# Patient Record
Sex: Female | Born: 1950
Health system: Southern US, Community
[De-identification: ages and names within clinical notes are randomized; demographics above are authoritative.]

## PROBLEM LIST (undated history)

## (undated) DIAGNOSIS — K219 Gastro-esophageal reflux disease without esophagitis: Secondary | ICD-10-CM

## (undated) DIAGNOSIS — K222 Esophageal obstruction: Secondary | ICD-10-CM

## (undated) DIAGNOSIS — IMO0001 Reserved for inherently not codable concepts without codable children: Secondary | ICD-10-CM

## (undated) DIAGNOSIS — F329 Major depressive disorder, single episode, unspecified: Secondary | ICD-10-CM

## (undated) DIAGNOSIS — Z9981 Dependence on supplemental oxygen: Secondary | ICD-10-CM

## (undated) DIAGNOSIS — Z5189 Encounter for other specified aftercare: Secondary | ICD-10-CM

## (undated) DIAGNOSIS — F419 Anxiety disorder, unspecified: Secondary | ICD-10-CM

## (undated) DIAGNOSIS — Z720 Tobacco use: Secondary | ICD-10-CM

## (undated) DIAGNOSIS — J449 Chronic obstructive pulmonary disease, unspecified: Secondary | ICD-10-CM

## (undated) DIAGNOSIS — E559 Vitamin D deficiency, unspecified: Secondary | ICD-10-CM

## (undated) DIAGNOSIS — E538 Deficiency of other specified B group vitamins: Secondary | ICD-10-CM

## (undated) DIAGNOSIS — K760 Fatty (change of) liver, not elsewhere classified: Secondary | ICD-10-CM

## (undated) DIAGNOSIS — F99 Mental disorder, not otherwise specified: Secondary | ICD-10-CM

## (undated) DIAGNOSIS — J439 Emphysema, unspecified: Secondary | ICD-10-CM

## (undated) DIAGNOSIS — Z8701 Personal history of pneumonia (recurrent): Secondary | ICD-10-CM

## (undated) HISTORY — DX: Fatty (change of) liver, not elsewhere classified: K76.0

## (undated) HISTORY — DX: Emphysema, unspecified: J43.9

## (undated) HISTORY — DX: Esophageal obstruction: K22.2

## (undated) HISTORY — PX: CHOLECYSTECTOMY: SHX55

## (undated) HISTORY — DX: Reserved for inherently not codable concepts without codable children: IMO0001

## (undated) HISTORY — PX: COLONOSCOPY: SHX174

## (undated) HISTORY — DX: Chronic obstructive pulmonary disease, unspecified: J44.9

## (undated) HISTORY — DX: Mental disorder, not otherwise specified: F99

## (undated) HISTORY — DX: Encounter for other specified aftercare: Z51.89

## (undated) HISTORY — DX: Major depressive disorder, single episode, unspecified: F32.9

## (undated) HISTORY — PX: ESOPHAGOGASTRODUODENOSCOPY (EGD) WITH ESOPHAGEAL DILATION: SHX5812

## (undated) HISTORY — PX: BREAST SURGERY: SHX581

## (undated) HISTORY — PX: ABDOMINAL HYSTERECTOMY: SHX81

## (undated) HISTORY — DX: Vitamin D deficiency, unspecified: E55.9

## (undated) HISTORY — DX: Deficiency of other specified B group vitamins: E53.8

## (undated) HISTORY — DX: Tobacco use: Z72.0

## (undated) HISTORY — DX: Anxiety disorder, unspecified: F41.9

## (undated) HISTORY — PX: UPPER GASTROINTESTINAL ENDOSCOPY: SHX188

## (undated) HISTORY — PX: CERVICAL CONE BIOPSY: SUR198

---
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
## 1998-11-06 ENCOUNTER — Emergency Department (HOSPITAL_COMMUNITY): Admission: EM | Admit: 1998-11-06 | Discharge: 1998-11-06 | Payer: Self-pay | Admitting: Emergency Medicine

## 2002-05-03 ENCOUNTER — Inpatient Hospital Stay (HOSPITAL_COMMUNITY): Admission: EM | Admit: 2002-05-03 | Discharge: 2002-05-11 | Payer: Self-pay | Admitting: Emergency Medicine

## 2002-05-03 ENCOUNTER — Encounter: Payer: Self-pay | Admitting: Emergency Medicine

## 2002-05-04 ENCOUNTER — Encounter: Payer: Self-pay | Admitting: Internal Medicine

## 2002-05-07 ENCOUNTER — Encounter: Payer: Self-pay | Admitting: Internal Medicine

## 2002-05-10 ENCOUNTER — Encounter: Payer: Self-pay | Admitting: Internal Medicine

## 2003-09-13 ENCOUNTER — Encounter: Admission: RE | Admit: 2003-09-13 | Discharge: 2003-09-13 | Payer: Self-pay | Admitting: Family Medicine

## 2003-09-13 ENCOUNTER — Encounter (INDEPENDENT_AMBULATORY_CARE_PROVIDER_SITE_OTHER): Payer: Self-pay | Admitting: Specialist

## 2003-09-29 ENCOUNTER — Ambulatory Visit (HOSPITAL_COMMUNITY): Admission: RE | Admit: 2003-09-29 | Discharge: 2003-09-29 | Payer: Self-pay | Admitting: Family Medicine

## 2003-10-02 ENCOUNTER — Other Ambulatory Visit: Admission: RE | Admit: 2003-10-02 | Discharge: 2003-10-02 | Payer: Self-pay | Admitting: Obstetrics & Gynecology

## 2003-10-25 ENCOUNTER — Ambulatory Visit (HOSPITAL_COMMUNITY): Admission: RE | Admit: 2003-10-25 | Discharge: 2003-10-25 | Payer: Self-pay | Admitting: Family Medicine

## 2003-10-26 ENCOUNTER — Ambulatory Visit (HOSPITAL_COMMUNITY): Admission: RE | Admit: 2003-10-26 | Discharge: 2003-10-26 | Payer: Self-pay | Admitting: Obstetrics & Gynecology

## 2003-11-06 ENCOUNTER — Inpatient Hospital Stay (HOSPITAL_COMMUNITY): Admission: AD | Admit: 2003-11-06 | Discharge: 2003-11-12 | Payer: Self-pay | Admitting: General Surgery

## 2003-11-24 ENCOUNTER — Encounter: Admission: RE | Admit: 2003-11-24 | Discharge: 2003-11-24 | Payer: Self-pay | Admitting: Psychiatry

## 2005-04-26 ENCOUNTER — Emergency Department (HOSPITAL_COMMUNITY): Admission: EM | Admit: 2005-04-26 | Discharge: 2005-04-26 | Payer: Self-pay | Admitting: Emergency Medicine

## 2005-09-29 ENCOUNTER — Ambulatory Visit (HOSPITAL_COMMUNITY): Admission: RE | Admit: 2005-09-29 | Discharge: 2005-09-29 | Payer: Self-pay | Admitting: General Surgery

## 2005-11-07 ENCOUNTER — Observation Stay (HOSPITAL_COMMUNITY): Admission: EM | Admit: 2005-11-07 | Discharge: 2005-11-07 | Payer: Self-pay | Admitting: Emergency Medicine

## 2006-05-12 ENCOUNTER — Ambulatory Visit: Payer: Self-pay | Admitting: Internal Medicine

## 2006-06-02 ENCOUNTER — Ambulatory Visit: Payer: Self-pay | Admitting: Internal Medicine

## 2006-06-29 ENCOUNTER — Ambulatory Visit: Payer: Self-pay | Admitting: Internal Medicine

## 2006-08-06 ENCOUNTER — Ambulatory Visit: Payer: Self-pay | Admitting: Internal Medicine

## 2006-11-11 ENCOUNTER — Ambulatory Visit: Payer: Self-pay | Admitting: Internal Medicine

## 2006-12-24 ENCOUNTER — Ambulatory Visit: Payer: Self-pay | Admitting: Internal Medicine

## 2007-02-23 ENCOUNTER — Ambulatory Visit: Payer: Self-pay | Admitting: Internal Medicine

## 2007-03-24 DIAGNOSIS — R498 Other voice and resonance disorders: Secondary | ICD-10-CM

## 2007-03-24 DIAGNOSIS — J449 Chronic obstructive pulmonary disease, unspecified: Secondary | ICD-10-CM

## 2007-04-05 ENCOUNTER — Ambulatory Visit: Payer: Self-pay | Admitting: Internal Medicine

## 2007-04-05 DIAGNOSIS — M810 Age-related osteoporosis without current pathological fracture: Secondary | ICD-10-CM

## 2007-04-05 DIAGNOSIS — J441 Chronic obstructive pulmonary disease with (acute) exacerbation: Secondary | ICD-10-CM | POA: Insufficient documentation

## 2007-06-11 ENCOUNTER — Ambulatory Visit: Payer: Self-pay | Admitting: Internal Medicine

## 2007-09-21 ENCOUNTER — Encounter (INDEPENDENT_AMBULATORY_CARE_PROVIDER_SITE_OTHER): Payer: Self-pay | Admitting: *Deleted

## 2007-10-28 ENCOUNTER — Ambulatory Visit: Payer: Self-pay | Admitting: Internal Medicine

## 2008-03-02 ENCOUNTER — Ambulatory Visit: Payer: Self-pay | Admitting: Internal Medicine

## 2008-03-02 DIAGNOSIS — Z87891 Personal history of nicotine dependence: Secondary | ICD-10-CM | POA: Insufficient documentation

## 2008-03-10 ENCOUNTER — Ambulatory Visit: Payer: Self-pay | Admitting: Internal Medicine

## 2008-03-10 LAB — CONVERTED CEMR LAB: Creatinine, Ser: 0.6 mg/dL (ref 0.4–1.2)

## 2008-03-24 ENCOUNTER — Ambulatory Visit (HOSPITAL_COMMUNITY): Admission: RE | Admit: 2008-03-24 | Discharge: 2008-03-24 | Payer: Self-pay | Admitting: Internal Medicine

## 2008-04-14 ENCOUNTER — Encounter: Payer: Self-pay | Admitting: Internal Medicine

## 2008-06-01 ENCOUNTER — Ambulatory Visit: Payer: Self-pay | Admitting: Internal Medicine

## 2008-11-08 ENCOUNTER — Ambulatory Visit: Payer: Self-pay | Admitting: Internal Medicine

## 2008-11-09 ENCOUNTER — Inpatient Hospital Stay (HOSPITAL_COMMUNITY): Admission: EM | Admit: 2008-11-09 | Discharge: 2008-11-11 | Payer: Self-pay | Admitting: Emergency Medicine

## 2009-03-02 ENCOUNTER — Encounter: Payer: Self-pay | Admitting: Internal Medicine

## 2009-04-04 ENCOUNTER — Emergency Department (HOSPITAL_COMMUNITY): Admission: EM | Admit: 2009-04-04 | Discharge: 2009-04-04 | Payer: Self-pay | Admitting: Family Medicine

## 2009-04-05 ENCOUNTER — Inpatient Hospital Stay (HOSPITAL_COMMUNITY): Admission: EM | Admit: 2009-04-05 | Discharge: 2009-04-07 | Payer: Self-pay | Admitting: Emergency Medicine

## 2009-05-31 ENCOUNTER — Ambulatory Visit (HOSPITAL_BASED_OUTPATIENT_CLINIC_OR_DEPARTMENT_OTHER): Admission: RE | Admit: 2009-05-31 | Discharge: 2009-05-31 | Payer: Self-pay | Admitting: Internal Medicine

## 2009-05-31 ENCOUNTER — Ambulatory Visit: Payer: Self-pay | Admitting: Internal Medicine

## 2009-05-31 ENCOUNTER — Ambulatory Visit: Payer: Self-pay | Admitting: Interventional Radiology

## 2009-05-31 DIAGNOSIS — F329 Major depressive disorder, single episode, unspecified: Secondary | ICD-10-CM

## 2009-05-31 DIAGNOSIS — F3289 Other specified depressive episodes: Secondary | ICD-10-CM

## 2009-05-31 HISTORY — DX: Other specified depressive episodes: F32.89

## 2009-05-31 HISTORY — DX: Major depressive disorder, single episode, unspecified: F32.9

## 2009-08-02 ENCOUNTER — Ambulatory Visit: Payer: Self-pay | Admitting: Internal Medicine

## 2009-09-27 ENCOUNTER — Encounter: Payer: Self-pay | Admitting: Internal Medicine

## 2010-02-05 ENCOUNTER — Inpatient Hospital Stay (HOSPITAL_COMMUNITY): Admission: EM | Admit: 2010-02-05 | Discharge: 2010-02-09 | Payer: Self-pay | Admitting: Emergency Medicine

## 2010-04-15 ENCOUNTER — Ambulatory Visit: Payer: Self-pay | Admitting: Internal Medicine

## 2010-04-15 ENCOUNTER — Telehealth: Payer: Self-pay | Admitting: Internal Medicine

## 2010-04-15 ENCOUNTER — Ambulatory Visit (HOSPITAL_BASED_OUTPATIENT_CLINIC_OR_DEPARTMENT_OTHER): Admission: RE | Admit: 2010-04-15 | Discharge: 2010-04-15 | Payer: Self-pay | Admitting: Internal Medicine

## 2010-04-15 ENCOUNTER — Ambulatory Visit: Payer: Self-pay | Admitting: Diagnostic Radiology

## 2010-04-15 DIAGNOSIS — R634 Abnormal weight loss: Secondary | ICD-10-CM

## 2010-04-15 LAB — CONVERTED CEMR LAB
Albumin: 4.3 g/dL (ref 3.5–5.2)
CO2: 31 meq/L (ref 19–32)
Calcium: 9.6 mg/dL (ref 8.4–10.5)
Creatinine, Ser: 0.62 mg/dL (ref 0.40–1.20)
Eosinophils Relative: 2 % (ref 0–5)
Free T4: 1.19 ng/dL (ref 0.80–1.80)
Glucose, Bld: 83 mg/dL (ref 70–99)
HCT: 50.1 % — ABNORMAL HIGH (ref 36.0–46.0)
HIV: NONREACTIVE
Hemoglobin: 16.3 g/dL — ABNORMAL HIGH (ref 12.0–15.0)
Lymphocytes Relative: 42 % (ref 12–46)
Lymphs Abs: 2.4 10*3/uL (ref 0.7–4.0)
Monocytes Absolute: 0.4 10*3/uL (ref 0.1–1.0)
RDW: 12.6 % (ref 11.5–15.5)
Total Protein: 6.6 g/dL (ref 6.0–8.3)
WBC: 5.6 10*3/uL (ref 4.0–10.5)

## 2010-04-16 ENCOUNTER — Encounter: Payer: Self-pay | Admitting: Internal Medicine

## 2010-04-17 ENCOUNTER — Ambulatory Visit: Payer: Self-pay | Admitting: Internal Medicine

## 2010-04-17 ENCOUNTER — Ambulatory Visit: Payer: Self-pay | Admitting: Diagnostic Radiology

## 2010-04-17 ENCOUNTER — Ambulatory Visit (HOSPITAL_BASED_OUTPATIENT_CLINIC_OR_DEPARTMENT_OTHER): Admission: RE | Admit: 2010-04-17 | Discharge: 2010-04-17 | Payer: Self-pay | Admitting: Internal Medicine

## 2010-04-22 ENCOUNTER — Encounter: Payer: Self-pay | Admitting: Internal Medicine

## 2010-04-22 ENCOUNTER — Telehealth: Payer: Self-pay | Admitting: Internal Medicine

## 2010-06-05 ENCOUNTER — Encounter: Payer: Self-pay | Admitting: Internal Medicine

## 2010-06-11 ENCOUNTER — Encounter: Payer: Self-pay | Admitting: Internal Medicine

## 2010-06-14 ENCOUNTER — Telehealth: Payer: Self-pay | Admitting: Internal Medicine

## 2010-06-14 ENCOUNTER — Encounter: Payer: Self-pay | Admitting: Internal Medicine

## 2010-06-14 ENCOUNTER — Ambulatory Visit
Admission: RE | Admit: 2010-06-14 | Discharge: 2010-06-14 | Payer: Self-pay | Source: Home / Self Care | Attending: Internal Medicine | Admitting: Internal Medicine

## 2010-06-14 DIAGNOSIS — E781 Pure hyperglyceridemia: Secondary | ICD-10-CM | POA: Insufficient documentation

## 2010-06-15 ENCOUNTER — Encounter: Payer: Self-pay | Admitting: Internal Medicine

## 2010-06-17 ENCOUNTER — Encounter: Payer: Self-pay | Admitting: Internal Medicine

## 2010-06-18 ENCOUNTER — Encounter: Payer: Self-pay | Admitting: Internal Medicine

## 2010-06-27 ENCOUNTER — Telehealth: Payer: Self-pay | Admitting: Internal Medicine

## 2010-06-27 NOTE — Letter (Signed)
   [place] at [facility] [address] [place], [place]  [address]  [place] Phone: [phone]      April 16, 2010   [place] [address] [place], [place] [address]  RE:  LAB RESULTS  Dear  Abigail Castillo,  The following is an interpretation of your most recent lab tests.  Please take note of any instructions provided or changes to medications that have resulted from your lab work.  ELECTROLYTES:  Good - no changes needed  KIDNEY FUNCTION TESTS:  Good - no changes needed  LIVER FUNCTION TESTS:  Stable - no changes needed  THYROID STUDIES:  Thyroid studies normal TSH: 0.524     CBC:  Good - no changes needed       Sincerely Yours,    Dr. Thomos Lemons  Appended Document:  mailed

## 2010-06-27 NOTE — Progress Notes (Signed)
Summary: Test Results  Phone Note Outgoing Call   Summary of Call: call pt - cxr shows possible pna.   I suggest CT of Chest.  see orders Initial call taken by: D. Thomos Lemons DO,  April 15, 2010 5:32 PM  Follow-up for Phone Call        call placed to patient at [phone], no answer. A detailed voice messagae was left with patients daughter Drue Flirt informing her per Dr Artist Pais instructions. She was advised that she will be receiving an additional phone call regarding the CT appointment date and time. Message was left for her to call if any questions Follow-up by: Glendell Docker [name],  April 16, 2010 9:10 AM     Appended Document: Test Results daughter was in with patient today to have skin test read, she states she recieved the message, but could not take the time off to take patient to have CT done. She was advised that patient could have CT done while she was here. She has agreed and has taken patient to radiology for CT as advised per Dr Artist Pais

## 2010-06-27 NOTE — Assessment & Plan Note (Signed)
Summary: 2 mo. f/u - jr   Vital Signs:  Patient profile:   60 year old female Height:      62 inches Weight:      95 pounds BMI:     17.44 O2 Sat:      98 % on Room air Temp:     97.7 degrees F oral Pulse rate:   93 / minute Pulse rhythm:   regular Resp:     22 per minute BP sitting:   100 / 70  (right arm) Cuff size:   regular  Vitals Entered By: Glendell Docker [name] (August 02, 2009 9:08 AM)  O2 Flow:  Room air CC: Rm 3- 2 month foolwo up, COPD follow-up   Primary Care Provider:  DThomos Lemons DO  CC:  Rm 3- 2 month foolwo up and COPD follow-up.  History of Present Illness:  COPD Follow-Up      This is a 60 year old woman who presents for COPD follow-up.  The patient reports shortness of breath and cough, but denies increased sputum.  Symptoms occur daily.  The patient reports limitation of most activities.  Pt continues to smoke.   Lives with her son.  sometimes only eats one good meal per day.  little protein intake pt accompanied by daughter  Allergies: 1)  ! Sulfa  Past History:  Past Medical History: Osteoporosis COPD with chronic bronchitis Ongoing tobacco abuse       Past Surgical History: Uterine Biopsy Cholecystectomy Lumpectomy       Family History: Mother deceased age 46 secondary to coronary artery disease. Father deceased at age 30 with coronary artery disease. Grandfather had type 2 diabetes Sister and niece are known to have breast cancer.      Social History: Current Smoker  Divorced She has very supportive daughter.      lives with her son  Physical Exam  General:  alert and underweight appearing.   Lungs:  decreased/distant breath sounds bilaterally,  no wheezing.  no dullness Heart:  normal rate, no murmur, and no gallop.   Extremities:  No lower extremity edema    Impression & Recommendations:  Problem # 1:  CHRONIC OBSTRUCTIVE PULMONARY DISEASE (ICD-496) Progressive decline.  Pt not willing to quit smoking.  Therapy limited  by poor compliance.   arrange duoneb Her updated medication list for this problem includes:    Duoneb 0.5-2.5 (3) Mg/91ml Soln (Ipratropium-albuterol) .Marland KitchenMarland KitchenMarland KitchenMarland Kitchen 3 ml qid    Symbicort 160-4.5 Mcg/act Aero (Budesonide-formoterol fumarate) .Marland Kitchen... 2 puffs two times a day  Pulmonary Functions Reviewed: O2 sat: 98 (08/02/2009)     Vaccines Reviewed: Pneumovax: Pneumovax (Medicare) (03/02/2008)   Flu Vax: Fluvax MCR (03/02/2008)  Problem # 2:  DEPRESSION (ICD-311) Poor by mouth intake.   limited nutrition.   I suggest she drink nutrional supplement daily.  increase protein intake. start mirtazapine.  hopefully, it will stimulate her appetite.  Her updated medication list for this problem includes:    Mirtazapine 15 Mg Tabs (Mirtazapine) ..... One by mouth at bedtime  Complete Medication List: 1)  Duoneb 0.5-2.5 (3) Mg/53ml Soln (Ipratropium-albuterol) .... 3 ml qid 2)  Mirtazapine 15 Mg Tabs (Mirtazapine) .... One by mouth at bedtime 3)  Symbicort 160-4.5 Mcg/act Aero (Budesonide-formoterol fumarate) .... 2 puffs two times a day 4)  Ensure Plus Liqd (Nutritional supplements) .Marland Kitchen.. 1 can daily  Patient Instructions: 1)  Please schedule a follow-up appointment in 4 months. 2)  Use nutrition supplement once daily Prescriptions: SYMBICORT 160-4.5  MCG/ACT AERO (BUDESONIDE-FORMOTEROL FUMARATE) 2 puffs two times a day  #1 x 5   Entered and Authorized by:   D. Thomos Lemons DO   Signed by:   D. Thomos Lemons DO on 08/02/2009   Method used:   Electronically to        [facility] [address] [phone]* (retail)       [address]       [address]       [place], [place]  [address]       Ph: [phone]       Fax: [phone]   RxID:   [id number] MIRTAZAPINE 15 MG TABS (MIRTAZAPINE) one by mouth at bedtime  #30 x 5   Entered and Authorized by:   D. Thomos Lemons DO   Signed by:   D. Thomos Lemons DO on 08/02/2009   Method used:   Electronically to        [facility] [address] [phone]* (retail)       [address]       [address]       [place], [place]  [address]       Ph: [phone]       Fax: [phone]   RxID:   [id number] ENSURE PLUS  LIQD (NUTRITIONAL SUPPLEMENTS) 1 can daily  #1 month x 5   Entered and Authorized by:   D. Thomos Lemons DO   Signed by:   D. Thomos Lemons DO on 08/02/2009   Method used:   Electronically to        [facility] [address] [phone]* (retail)       [address]       [address]       [place], [place]  [address]       Ph: [phone]       Fax: [phone]   RxID:   [id number]   Current Allergies (reviewed today): ! SULFA

## 2010-06-27 NOTE — Assessment & Plan Note (Signed)
Summary: Needs  paper work and tb skin test for NH/hea   Vital Signs:  Patient profile:   60 year old female Height:      62 inches Weight:      86.75 pounds BMI:     15.92 O2 Sat:      98 % on Room air Temp:     97.9 degrees F oral Pulse rate:   78 / minute BP sitting:   100 / 60  (right arm) Cuff size:   regular  Vitals Entered By: Glendell Docker [name] (April 15, 2010 2:37 PM)  O2 Flow:  Room air  CC: Paper work & Tb skin test  Is Patient Diabetic? No Pain Assessment Patient in pain? no          Last PAP Result Declined   Primary Care Provider:  Dondra Spry DO  CC:  Paper work & Tb skin test .  History of Present Illness: 60 y/o white female with hx of copd and ongoing tob use for follow up daugher notes pt feeling depressed pt was evicted from senior apartments due to her sons drinking too much   pt is trying to get into assisted living facility  additional wt loss since prev visit feeling tired  COPD - feeling better since sept 2011 when pt hospitalized for viral pneumonia     Preventive Screening-Counseling & Management  Alcohol-Tobacco     Smoking Status: current  Allergies: 1)  ! Sulfa  Past History:  Past Medical History: Osteoporosis COPD with chronic bronchitis  Ongoing tobacco abuse       Past Surgical History: Uterine Biopsy Cholecystectomy  Lumpectomy       Social History: Current Smoker  Divorced She has very supportive daughter.      lives with her son   Review of Systems  The patient denies chest pain and severe indigestion/heartburn.         cough  Physical Exam  General:  alert and underweight appearing.   Head:  normocephalic and atraumatic.   Mouth:  pharynx pink and moist.   Neck:  No deformities, masses, or tenderness noted. no adenopathy Lungs:  normal respiratory effort and normal breath sounds.   Heart:  normal rate, regular rhythm, and no gallop.   Extremities:  No lower extremity  edema  Neurologic:  cranial nerves II-XII intact and gait normal.   Psych:  slightly anxious.     Impression & Recommendations:  Problem # 1:  WEIGHT LOSS (ICD-783.21) rule out pulm maligancy  Orders: T-Basic Metabolic Panel [id number]) T-Hepatic Function [phone]) T-CBC w/Diff [id number]) T-T4, Free [id number]) T-TSH ([id number]) T-HIV Antibody  (Reflex) [id number]) T-2 View CXR, Same Day (71020.5TC)  Problem # 2:  CHRONIC OBSTRUCTIVE PULMONARY DISEASE (ICD-496)  Her updated medication list for this problem includes:    Duoneb 0.5-2.5 (3) Mg/83ml Soln (Ipratropium-albuterol) .Marland KitchenMarland KitchenMarland KitchenMarland Kitchen 3 ml qid    Symbicort 160-4.5 Mcg/act Aero (Budesonide-formoterol fumarate) .Marland Kitchen... 2 puffs two times a day  Orders: T-2 View CXR, Same Day (71020.5TC)  Problem # 3:  DEPRESSION (ICD-311) trial of mirtazapine Patient advised to call office if symptoms persist or worsen.  Her updated medication list for this problem includes:    Mirtazapine 15 Mg Tabs (Mirtazapine) ..... One by mouth at bedtime    Alprazolam 0.25 Mg Tabs (Alprazolam) ..... One by mouth once daily as needed  Complete Medication List: 1)  Duoneb 0.5-2.5 (3) Mg/37ml Soln (Ipratropium-albuterol) .... 3 ml qid 2)  Mirtazapine 15 Mg Tabs (Mirtazapine) .Marland KitchenMarland KitchenMarland Kitchen  One by mouth at bedtime 3)  Symbicort 160-4.5 Mcg/act Aero (Budesonide-formoterol fumarate) .... 2 puffs two times a day 4)  Alprazolam 0.25 Mg Tabs (Alprazolam) .... One by mouth once daily as needed  Other Orders: TB Skin Test [id number]) Admin 1st Vaccine ([address])  Patient Instructions: 1)  Please schedule a follow-up appointment in 2 months. Prescriptions: DUONEB 0.5-2.5 (3) MG/3ML SOLN (IPRATROPIUM-ALBUTEROL) 3 ml qid  #1 month x 5   Entered and Authorized by:   D. Thomos Lemons DO   Signed by:   D. Thomos Lemons DO on 04/15/2010   Method used:   Electronically to        [facility] [address] [phone]* (retail)       [address]       [address]        [place], [place]  [address]       Ph: [phone]       Fax: [phone]   RxID:   [id number] SYMBICORT 160-4.5 MCG/ACT AERO (BUDESONIDE-FORMOTEROL FUMARATE) 2 puffs two times a day  #1 x 5   Entered and Authorized by:   D. Thomos Lemons DO   Signed by:   D. Thomos Lemons DO on 04/15/2010   Method used:   Electronically to        [facility] [address] [phone]* (retail)       [address]       [address]       [place], [place]  [address]       Ph: [phone]       Fax: [phone]   RxID:   [id number] MIRTAZAPINE 15 MG TABS (MIRTAZAPINE) one by mouth at bedtime  #30 x 3   Entered and Authorized by:   D. Thomos Lemons DO   Signed by:   D. Thomos Lemons DO on 04/15/2010   Method used:   Electronically to        [facility] [address] [phone]* (retail)       [address]       [address]       [place], [place]  [address]       Ph: [phone]       Fax: [phone]   RxID:   [id number] ALPRAZOLAM 0.25 MG TABS (ALPRAZOLAM) one by mouth once daily as needed  #30 x 1   Entered and Authorized by:   D. Thomos Lemons DO   Signed by:   D. Thomos Lemons DO on 04/15/2010   Method used:   Print then Give to Patient   RxID:   [id number]    Orders Added: 1)  T-Basic Metabolic Panel [[id number]] 2)  T-Hepatic Function [80076-22960] 3)  T-CBC w/Diff [51025-85277] 4)  T-T4, Free [82423-53614] 5)  T-TSH [[id number]] 6)  T-HIV Antibody  (Reflex) [[id number]] 7)  T-2 View CXR, Same Day [71020.5TC] 8)  TB Skin Test [86580] 9)  Admin 1st Vaccine [90471] 10)  Est. Patient Level IV [[address]]   Immunizations Administered:  PPD Skin Test:    Vaccine Type: PPD    Site: left forearm    Mfr: Sanofi Pasteur    Dose: 0.1 ml    Route: ID    Given by: Mervin Kung [name] (AAMA)    Exp. Date: 03/28/2011    Lot #: [id number]   Immunizations Administered:  PPD Skin Test:    Vaccine Type: PPD    Site: left forearm    Mfr: Sanofi Pasteur    Dose: 0.1  ml    Route: ID    Given by: Mervin Kung [name] (AAMA)    Exp. Date: 03/28/2011    Lot #: [id number]    Preventive Care Screening  Mammogram:    Date:  04/15/2010    Results:  Declined  Pap Smear:    Date:  04/15/2010    Results:  Declined   Current Allergies (reviewed today): ! SULFA

## 2010-06-27 NOTE — Miscellaneous (Signed)
Summary: FL-2/[facility]  FL-2/[facility]   Imported By: Maryln Gottron 04/29/2010 10:07:36  _____________________________________________________________________  External Attachment:    Type:   Image     Comment:   External Document

## 2010-06-27 NOTE — Miscellaneous (Signed)
Summary: Care Plan/[facility]   Imported By: Lanelle Bal 06/11/2010 10:37:36  _____________________________________________________________________  External Attachment:    Type:   Image     Comment:   External Document

## 2010-06-27 NOTE — Letter (Signed)
Summary: CMN for [facility]  CMN for [facility]   Imported By: Lanelle Bal 10/03/2009 09:43:02  _____________________________________________________________________  External Attachment:    Type:   Image     Comment:   External Document

## 2010-06-27 NOTE — Medication Information (Signed)
Summary: Rx for Nebulizer and Inhalation Drugs/[facility]  Rx for Nebulizer [facility]   Imported By: Maryln Gottron 04/29/2010 10:10:32  _____________________________________________________________________  External Attachment:    Type:   Image     Comment:   External Document

## 2010-06-27 NOTE — Assessment & Plan Note (Signed)
Summary: short of breath/mhf   Vital Signs:  Patient profile:   60 year old female Weight:      101.25 pounds BMI:     18.59 O2 Sat:      97 % on Room air Temp:     97.9 degrees F oral Pulse rate:   97 / minute Pulse rhythm:   regular Resp:     20 per minute BP sitting:   126 / 76  (right arm) Cuff size:   regular  Vitals Entered By: Glendell Docker [name] (May 31, 2009 11:42 AM)  O2 Flow:  Room air  Primary Care Provider:  D. Thomos Lemons DO  CC:  Diffiuclty Breathing and COPD follow-up.  History of Present Illness:       This is a 60 year old woman who presents for COPD follow-up.  The patient reports shortness of breath, cough, and increased sputum.  Symptoms occur daily.  The patient reports limitation of most activities.  Current treatment includes home nebulizer.  Severity risk factors include hospitalization in last year, psychosocial issues, and cigarette smoking.  still smoking >1 ppd.    she has difficulty smoking.  sometimes smokes 24 hrs  Preventive Screening-Counseling & Management  Alcohol-Tobacco     Smoking Status: current     Packs/Day: 1.0  Allergies: 1)  ! Sulfa  Past History:  Past Medical History: Osteoporosis COPD with chronic bronchitis Ongoing tobacco abuse     Past Surgical History: Uterine Biopsy Cholecystectomy Lumpectomy     Family History: Mother deceased age 73 secondary to coronary artery disease. Father deceased at age 34 with coronary artery disease. Grandfather had type 2 diabetes Sister and niece are known to have breast cancer.    Social History: Current Smoker Divorced She has very supportive daughter.      Packs/Day:  1.0  Review of Systems       occasional vomiting with cough  Physical Exam  General:  alert and cachetic.   Head:  normocephalic and atraumatic.   Lungs:  decreased/distant breath sounds bilaterally,  no wheezing.  no dullness Heart:  normal rate, regular rhythm, and no gallop.   Psych:   normally interactive and good eye contact.     Impression & Recommendations:  Problem # 1:  CHRONIC OBSTRUCTIVE PULMONARY DISEASE, ACUTE EXACERBATION (ICD-491.21) Pt hospitalized in Nov, 2011 for COPD exacerbation.   He got better but notes increased cough and congestion over last month.  she continues to smoke >1 ppd.   Treat with avelox x 10 days.  Her tx limited by psychiatric issues. restart maintenance nebs.   consult [facility]iva for COPD teaching.  Orders: T-2 View CXR, Same Day (71020.5TC)  Problem # 2:  DEPRESSION (ICD-311) she has chronic insomnia.  she stays up all night and smokes.  trial of remeron.  Her updated medication list for this problem includes:    Mirtazapine 15 Mg Tabs (Mirtazapine) ..... One by mouth at bedtime  Complete Medication List: 1)  Duoneb 0.5-2.5 (3) Mg/6ml Soln (Ipratropium-albuterol) .... 3 ml qid 2)  Mirtazapine 15 Mg Tabs (Mirtazapine) .... One by mouth at bedtime 3)  Avelox 400 Mg Tabs (Moxifloxacin hcl) .... One by mouth qd 4)  Symbicort 160-4.5 Mcg/act Aero (Budesonide-formoterol fumarate) .... 2 puffs two times a day  Other Orders: Misc. Referral (Misc. Ref)  Patient Instructions: 1)  Please schedule a follow-up appointment in 2 months. Prescriptions: SYMBICORT 160-4.5 MCG/ACT AERO (BUDESONIDE-FORMOTEROL FUMARATE) 2 puffs two times a day  #1  x 2   Entered and Authorized by:   D. Thomos Lemons DO   Signed by:   D. Thomos Lemons DO on 05/31/2009   Method used:   Print then Give to Patient   RxID:   [id number] AVELOX 400 MG TABS (MOXIFLOXACIN HCL) one by mouth qd  #10 x 0   Entered and Authorized by:   D. Thomos Lemons DO   Signed by:   D. Thomos Lemons DO on 05/31/2009   Method used:   Print then Give to Patient   RxID:   [id number] PULMICORT 0.25 MG/2ML SUSP (BUDESONIDE) use bid  #1 month x 5   Entered and Authorized by:   D. Thomos Lemons DO   Signed by:   D. Thomos Lemons DO on 05/31/2009   Method used:   Print then Give to Patient   RxID:    [id number] DUONEB 0.5-2.5 (3) MG/3ML SOLN (IPRATROPIUM-ALBUTEROL) 3 ml qid  #1 month x 5   Entered and Authorized by:   D. Thomos Lemons DO   Signed by:   D. Thomos Lemons DO on 05/31/2009   Method used:   Print then Give to Patient   RxID:   [id number] MIRTAZAPINE 15 MG TABS (MIRTAZAPINE) one by mouth at bedtime  #30 x 3   Entered and Authorized by:   D. Thomos Lemons DO   Signed by:   D. Thomos Lemons DO on 05/31/2009   Method used:   Print then Give to Patient   RxID:   [id number]   Current Allergies (reviewed today): ! SULFA

## 2010-06-27 NOTE — Progress Notes (Signed)
Summary: Prior Abigail Castillo  Phone Note Outgoing Call   Call placed by: Darral Dash Call placed to: Insurer Details for Reason: Prior Auth   Summary of Call: Prior Auth for  [name] solution    form requested Initial call taken by: Darral Dash,  June 14, 2010 11:49 AM  Follow-up for Phone Call        Prior authorization questioniare received, forwarded to Dr Artist Pais for completion and signature Follow-up by: Glendell Docker [name],  June 14, 2010 4:07 PM  Additional Follow-up for Phone Call Additional follow up Details #1::         authorization form faxed to [employer] at [phone], awaiting approval/denial status Additional Follow-up by: Glendell Docker [name],  June 17, 2010 10:13 AM    Additional Follow-up for Phone Call Additional follow up Details #2::    Received response from [employer] that med is eligible for coverage under Medicare Part B. Spoke to pharmacist and he states Medicare is requiring a CMN and to call [phone] for correct form. Nicki Guadalajara Fergerson [name] Duncan Dull)  June 18, 2010 3:33 PM    Spoke to [place] at [facility] and she states they received our CMN and rx is approved. Pharmacy can run through the system in 10 min. Notified Feliz Beam at pharmacy. Detailed message left on pt's phone re: approval status. Nicki Gu[place]ara Fergerson [name] Duncan Dull)  June 18, 2010 4:02 PM

## 2010-06-27 NOTE — Assessment & Plan Note (Signed)
Summary: read tb test/mhf  Nurse Visit   Primary Care Provider:  Dondra Spry DO  CC:  TB skin test recheck.  History of Present Illness: The patient presented after 48 hours to check the injection site for positive or negative reaction.  Injection site examination: No firm bump forms at the test sitre. Slighlty reddish appearanceand diameter was smaller than 5mm.  Assessment & Plan:  Negative TB skin test. Patient was counseled to call id experience any irritation of  site.  Glendell Docker [name]  April 17, 2010 4:09 PM    Allergies: 1)  ! Sulfa  PPD Results    Date of reading: 04/17/2010    Results: < 5mm    Interpretation: negative

## 2010-06-27 NOTE — Progress Notes (Signed)
Summary: CT Results  Phone Note Outgoing Call   Summary of Call: call pt - CT of chest negative for lung cancer.   It does show severe copd Initial call taken by: D. Thomos Lemons DO,  April 22, 2010 12:48 PM  Follow-up for Phone Call        call placed to patient at [phone], no answer. Voice recording reached. A detailed voice message was left informing patients daughter Drue Flirt per Dr Artist Pais instructions. She has been advised to call if any questions Follow-up by: Glendell Docker [name],  April 22, 2010 3:28 PM

## 2010-07-03 NOTE — Miscellaneous (Signed)
Summary: Care Plan/[facility]   Imported By: Sherian Rein 06/25/2010 12:36:09  _____________________________________________________________________  External Attachment:    Type:   Image     Comment:   External Document

## 2010-07-03 NOTE — Progress Notes (Signed)
Summary: Exam form/[facility]  Exam form/[facility]   Imported By: Sherian Rein 06/25/2010 11:05:07  _____________________________________________________________________  External Attachment:    Type:   Image     Comment:   External Document

## 2010-07-03 NOTE — Progress Notes (Signed)
Summary: assisted living requests all med orders  Phone Note From Other Clinic   Caller: Nurse-chelsea Call For: darlene Reason for Call: Medication Check Summary of Call: please call chelsea from [facility] [phone] fax [phone]. pt is switching pharmacies and assisted living needs all med orders.  Initial call taken by: Elba Barman,  June 27, 2010 2:26 PM  Follow-up for Phone Call        Spoke to Mackey at [facility] and verified that pt will begin using their pharmacy ([facility]). She is requesting written rx for all of pt's meds to be faxed to them at the # above.  Rxs printed and faxed to Provider for signature. Follow-up by: Mervin Kung [name] Duncan Dull),  June 27, 2010 3:51 PM  Additional Follow-up for Phone Call Additional follow up Details #1::        signed Additional Follow-up by: D. Thomos Lemons DO,  June 27, 2010 4:21 PM    Prescriptions: ALPRAZOLAM 0.25 MG TABS (ALPRAZOLAM) one by mouth once daily as needed  #30 x 1   Entered by:   Mervin Kung [name] (AAMA)   Authorized by:   Glendell Docker [name]   Signed by:   Mervin Kung [name] (AAMA) on 06/27/2010   Method used:   Print then Give to Patient   RxID:   [id number] SYMBICORT 160-4.5 MCG/ACT AERO (BUDESONIDE-FORMOTEROL FUMARATE) 2 puffs two times a day  #1 x 5   Entered by:   Mervin Kung [name] (AAMA)   Authorized by:   Glendell Docker [name]   Signed by:   Mervin Kung [name] (AAMA) on 06/27/2010   Method used:   Print then Give to Patient   RxID:   [id number] MIRTAZAPINE 15 MG TABS (MIRTAZAPINE) one by mouth at bedtime  #30 x 5   Entered by:   Mervin Kung [name] (AAMA)   Authorized by:   Glendell Docker [name]   Signed by:   Mervin Kung [name] (AAMA) on 06/27/2010   Method used:   Print then Give to Patient   RxID:   [id number] DUONEB 0.5-2.5 (3) MG/3ML SOLN (IPRATROPIUM-ALBUTEROL) 3 ml qid  #1 month x 5   Entered by:   Mervin Kung [name] (AAMA)   Authorized by:    Glendell Docker [name]   Signed by:   Mervin Kung [name] (AAMA) on 06/27/2010   Method used:   Print then Give to Patient   RxID:   [id number]   Appended Document: assisted living requests all med orders Rxs faxed to [phone].

## 2010-07-03 NOTE — Assessment & Plan Note (Signed)
Summary: 2 month follow up/mhf   Vital Signs:  Patient profile:   60 year old Castillo Height:      62 inches Weight:      90.75 pounds BMI:     16.66 O2 Sat:      98 % on Room air Temp:     97.6 degrees F oral Pulse rate:   67 / minute Resp:     18 per minute BP sitting:   90 / 60  (right arm) Cuff size:   regular  Vitals Entered By: Glendell Docker [name] (June 14, 2010 7:58 AM)  O2 Flow:  Room air CC: 2 Month Follow up , COPD follow-up Is Patient Diabetic? No Pain Assessment Patient in pain? no        Primary Care Provider:  Dondra Spry DO  CC:  2 Month Follow up  and COPD follow-up.  History of Present Illness:  COPD Follow-Up      This is a 60 year old woman who presents for COPD follow-up.  The patient denies shortness of breath, wheezing, and increased sputum.  The patient reports limitation of strenuous activities and limitation of moderate activities.  Current treatment includes home nebulizer.  less tob use pt living at [facility]  wt gain   Preventive Screening-Counseling & Management  Alcohol-Tobacco     Smoking Status: current  Allergies: 1)  ! Sulfa  Past History:  Past Medical History: Osteoporosis COPD with chronic bronchitis  Ongoing tobacco abuse        Past Surgical History: Uterine Biopsy Cholecystectomy   Lumpectomy      PMH-FH-SH reviewed-no changes except otherwise noted  Family History: Mother deceased age 97 secondary to coronary artery disease. Father deceased at age 59 with coronary artery disease. Grandfather had type 2 diabetes Sister and niece are known to have breast cancer.       Social History: Current Smoker  Divorced She has supportive daughter.       Physical Exam  General:  alert, well-developed, and well-nourished.   Lungs:  normal respiratory effort and normal breath sounds.   Heart:  normal rate, regular rhythm, and no gallop.     Impression & Recommendations:  Problem # 1:  CHRONIC OBSTRUCTIVE  PULMONARY DISEASE (ICD-496) Assessment Unchanged  Her updated medication list for this problem includes:    Duoneb 0.5-2.5 (3) Mg/33ml Soln (Ipratropium-albuterol) .Marland KitchenMarland KitchenMarland KitchenMarland Kitchen 3 ml qid    Symbicort 160-4.5 Mcg/act Aero (Budesonide-formoterol fumarate) .Marland Kitchen... 2 puffs two times a day  Pulmonary Functions Reviewed: O2 sat: 98 (06/14/2010)     Vaccines Reviewed: Pneumovax: Pneumovax (Medicare) (03/02/2008)   Flu Vax: Historical (04/09/2010)  Problem # 2:  WEIGHT LOSS (FIE-332.95) Assessment: Improved improved nutrition at [facility]  Problem # 3:  DEPRESSION (ICD-311) Assessment: Improved  Her updated medication list for this problem includes:    Mirtazapine 15 Mg Tabs (Mirtazapine) ..... One by mouth at bedtime    Alprazolam 0.25 Mg Tabs (Alprazolam) ..... One by mouth once daily as needed  Complete Medication List: 1)  Duoneb 0.5-2.5 (3) Mg/64ml Soln (Ipratropium-albuterol) .... 3 ml qid 2)  Mirtazapine 15 Mg Tabs (Mirtazapine) .... One by mouth at bedtime 3)  Symbicort 160-4.5 Mcg/act Aero (Budesonide-formoterol fumarate) .... 2 puffs two times a day 4)  Alprazolam 0.25 Mg Tabs (Alprazolam) .... One by mouth once daily as needed  Patient Instructions: 1)  Please schedule a follow-up appointment in 4 months. Prescriptions: DUONEB 0.5-2.5 (3) MG/3ML SOLN (IPRATROPIUM-ALBUTEROL) 3 ml qid  #  1 month x 5   Entered and Authorized by:   D. Thomos Lemons DO   Signed by:   D. Thomos Lemons DO on 06/14/2010   Method used:   Electronically to        [facility] [address] [phone]* (retail)       [address]       [address]       [place], [place]  [address]       Ph: [phone]       Fax: [phone]   RxID:   [id number] SYMBICORT 160-4.5 MCG/ACT AERO (BUDESONIDE-FORMOTEROL FUMARATE) 2 puffs two times a day  #1 x 5   Entered and Authorized by:   D. Thomos Lemons DO   Signed by:   D. Thomos Lemons DO on 06/14/2010   Method used:   Electronically to        [facility] [address] [phone]*  (retail)       [address]       [address]       [place], [place]  [address]       Ph: [phone]       Fax: [phone]   RxID:   [id number] MIRTAZAPINE 15 MG TABS (MIRTAZAPINE) one by mouth at bedtime  #30 x 5   Entered and Authorized by:   D. Thomos Lemons DO   Signed by:   D. Thomos Lemons DO on 06/14/2010   Method used:   Electronically to        [facility] [address] [phone]* (retail)       [address]       [address]       [place], [place]  [address]       Ph: [phone]       Fax: [phone]   RxID:   [id number]    Orders Added: 1)  Est. Patient Level III [[address]]   Immunization History:  Influenza Immunization History:    Influenza:  historical (04/09/2010)   Immunization History:  Influenza Immunization History:    Influenza:  Historical (04/09/2010)   Current Allergies: ! SULFA

## 2010-07-11 NOTE — Medication Information (Signed)
Summary: Letter Regarding Ipratropium Albuterol/[employer]  Letter Regarding Ipratropium Albuterol/[employer]   Imported By: Lanelle Bal 07/02/2010 13:17:22  _____________________________________________________________________  External Attachment:    Type:   Image     Comment:   External Document

## 2010-07-11 NOTE — Medication Information (Signed)
Summary: Prior Authorization for Ipratropium Albuterol/[employer]  Prior Authorization for Ipratropium Albuterol/[employer]   Imported By: Lanelle Bal 07/02/2010 14:13:25  _____________________________________________________________________  External Attachment:    Type:   Image     Comment:   External Document

## 2010-07-18 ENCOUNTER — Encounter: Payer: Self-pay | Admitting: Internal Medicine

## 2010-08-01 NOTE — Miscellaneous (Signed)
Summary: Physician's Orders/[facility]  Physician's Orders/[facility]   Imported By: Maryln Gottron 07/26/2010 15:45:54  _____________________________________________________________________  External Attachment:    Type:   Image     Comment:   External Document

## 2010-08-08 LAB — BASIC METABOLIC PANEL
BUN: 11 mg/dL (ref 6–23)
BUN: 4 mg/dL — ABNORMAL LOW (ref 6–23)
CO2: 30 mEq/L (ref 19–32)
Calcium: 9.1 mg/dL (ref 8.4–10.5)
Calcium: 9.2 mg/dL (ref 8.4–10.5)
Chloride: 103 mEq/L (ref 96–112)
Creatinine, Ser: 0.49 mg/dL (ref 0.4–1.2)
Creatinine, Ser: 0.49 mg/dL (ref 0.4–1.2)
Creatinine, Ser: 0.64 mg/dL (ref 0.4–1.2)
GFR calc Af Amer: >60 mL/min (ref >60)
GFR calc non Af Amer: >60 mL/min (ref >60)
GFR calc non Af Amer: >60 mL/min (ref >60)
Glucose, Bld: 147 mg/dL — ABNORMAL HIGH (ref 70–99)
Sodium: 141 mEq/L (ref 135–145)

## 2010-08-08 LAB — CBC
HCT: 44.4 % (ref 36.0–46.0)
Hemoglobin: 12.1 g/dL (ref 12.0–15.0)
Hemoglobin: 14.8 g/dL (ref 12.0–15.0)
MCH: 32 pg (ref 26.0–34.0)
MCH: 32 pg (ref 26.0–34.0)
MCHC: 33.3 g/dL (ref 30.0–36.0)
MCHC: 33.3 g/dL (ref 30.0–36.0)
MCV: 97.1 fL (ref 78.0–100.0)
MCV: 96.1 fL (ref 78.0–100.0)
Platelets: 237 10*3/uL (ref 150–400)
RDW: 13.1 % (ref 11.5–15.5)
WBC: 16.9 10*3/uL — ABNORMAL HIGH (ref 4.0–10.5)

## 2010-08-08 LAB — BRAIN NATRIURETIC PEPTIDE: Pro B Natriuretic peptide (BNP): 84 pg/mL (ref 0.0–100.0)

## 2010-08-08 LAB — POCT I-STAT 3, ART BLOOD GAS (G3+)
Acid-Base Excess: 7 mmol/L — ABNORMAL HIGH (ref 0.0–2.0)
pO2, Arterial: 107 mmHg — ABNORMAL HIGH (ref 80.0–100.0)

## 2010-08-28 LAB — DIFFERENTIAL
Basophils Absolute: 0 10*3/uL (ref 0.0–0.1)
Basophils Relative: 0 % (ref 0–1)
Monocytes Relative: 3 % (ref 3–12)
Neutro Abs: 4.8 10*3/uL (ref 1.7–7.7)
Neutrophils Relative %: 85 % — ABNORMAL HIGH (ref 43–77)

## 2010-08-28 LAB — GLUCOSE, CAPILLARY
Glucose-Capillary: 100 mg/dL — ABNORMAL HIGH (ref 70–99)
Glucose-Capillary: 105 mg/dL — ABNORMAL HIGH (ref 70–99)
Glucose-Capillary: 106 mg/dL — ABNORMAL HIGH (ref 70–99)
Glucose-Capillary: 122 mg/dL — ABNORMAL HIGH (ref 70–99)
Glucose-Capillary: 122 mg/dL — ABNORMAL HIGH (ref 70–99)
Glucose-Capillary: 131 mg/dL — ABNORMAL HIGH (ref 70–99)
Glucose-Capillary: 78 mg/dL (ref 70–99)
Glucose-Capillary: 99 mg/dL (ref 70–99)

## 2010-08-28 LAB — CBC
HCT: 34.9 % — ABNORMAL LOW (ref 36.0–46.0)
MCHC: 34.7 g/dL (ref 30.0–36.0)
Platelets: 183 10*3/uL (ref 150–400)
RBC: 4.01 MIL/uL (ref 3.87–5.11)
RDW: 12.3 % (ref 11.5–15.5)
WBC: 5.7 10*3/uL (ref 4.0–10.5)

## 2010-08-28 LAB — POCT I-STAT 3, ART BLOOD GAS (G3+)
pCO2 arterial: 44.2 mmHg (ref 35.0–45.0)
pO2, Arterial: 55 mmHg — ABNORMAL LOW (ref 80.0–100.0)

## 2010-08-28 LAB — BASIC METABOLIC PANEL
BUN: 6 mg/dL (ref 6–23)
CO2: 26 mEq/L (ref 19–32)
Calcium: 8 mg/dL — ABNORMAL LOW (ref 8.4–10.5)
Calcium: 8.7 mg/dL (ref 8.4–10.5)
Creatinine, Ser: 0.55 mg/dL (ref 0.4–1.2)
GFR calc Af Amer: >60 mL/min (ref >60)
GFR calc non Af Amer: >60 mL/min (ref >60)
Potassium: 4.5 mEq/L (ref 3.5–5.1)

## 2010-08-28 LAB — HEPATIC FUNCTION PANEL
ALT: 14 U/L (ref 0–35)
Alkaline Phosphatase: 94 U/L (ref 39–117)
Bilirubin, Direct: 0.2 mg/dL (ref 0.0–0.3)
Indirect Bilirubin: 0.8 mg/dL (ref 0.3–0.9)

## 2010-08-28 LAB — CULTURE, BLOOD (ROUTINE X 2)

## 2010-09-02 LAB — CBC
HCT: 41.1 % (ref 36.0–46.0)
Hemoglobin: 13.8 g/dL (ref 12.0–15.0)
MCHC: 33.6 g/dL (ref 30.0–36.0)
MCV: 95.4 fL (ref 78.0–100.0)
Platelets: 263 10*3/uL (ref 150–400)
RBC: 4.31 MIL/uL (ref 3.87–5.11)
RDW: 12.4 % (ref 11.5–15.5)
RDW: 12.5 % (ref 11.5–15.5)

## 2010-09-02 LAB — POCT I-STAT, CHEM 8
Calcium, Ion: 1.1 mmol/L — ABNORMAL LOW (ref 1.12–1.32)
Chloride: 92 mEq/L — ABNORMAL LOW (ref 96–112)
Glucose, Bld: 129 mg/dL — ABNORMAL HIGH (ref 70–99)
HCT: 41 % (ref 36.0–46.0)
Hemoglobin: 13.9 g/dL (ref 12.0–15.0)
TCO2: 33 mmol/L (ref 0–100)

## 2010-09-02 LAB — URINALYSIS, ROUTINE W REFLEX MICROSCOPIC
Glucose, UA: NEGATIVE mg/dL
Ketones, ur: 40 mg/dL — AB
Nitrite: NEGATIVE
Protein, ur: NEGATIVE mg/dL
Urobilinogen, UA: 2 mg/dL — ABNORMAL HIGH (ref 0.0–1.0)

## 2010-09-02 LAB — COMPREHENSIVE METABOLIC PANEL
ALT: 10 U/L (ref 0–35)
AST: 13 U/L (ref 0–37)
CO2: 28 mEq/L (ref 19–32)
Chloride: 94 mEq/L — ABNORMAL LOW (ref 96–112)
Creatinine, Ser: 0.57 mg/dL (ref 0.4–1.2)
GFR calc Af Amer: >60 mL/min (ref >60)
GFR calc non Af Amer: >60 mL/min (ref >60)
Sodium: 133 mEq/L — ABNORMAL LOW (ref 135–145)
Total Bilirubin: 1.8 mg/dL — ABNORMAL HIGH (ref 0.3–1.2)

## 2010-09-02 LAB — BASIC METABOLIC PANEL
BUN: 12 mg/dL (ref 6–23)
Calcium: 8.5 mg/dL (ref 8.4–10.5)
GFR calc non Af Amer: >60 mL/min (ref >60)
Glucose, Bld: 157 mg/dL — ABNORMAL HIGH (ref 70–99)

## 2010-09-02 LAB — CARDIAC PANEL(CRET KIN+CKTOT+MB+TROPI)
CK, MB: 1.3 ng/mL (ref 0.3–4.0)
Relative Index: INVALID (ref 0.0–2.5)
Total CK: 26 U/L (ref 7–177)
Total CK: 30 U/L (ref 7–177)

## 2010-09-02 LAB — DIFFERENTIAL
Basophils Absolute: 0 10*3/uL (ref 0.0–0.1)
Basophils Relative: 0 % (ref 0–1)
Eosinophils Relative: 0 % (ref 0–5)
Monocytes Absolute: 0.9 10*3/uL (ref 0.1–1.0)
Monocytes Relative: 9 % (ref 3–12)

## 2010-09-02 LAB — URINE CULTURE
Colony Count: NO GROWTH
Culture: NO GROWTH

## 2010-09-02 LAB — LEGIONELLA ANTIGEN, URINE

## 2010-09-02 LAB — CK TOTAL AND CKMB (NOT AT ARMC)
CK, MB: 1 ng/mL (ref 0.3–4.0)
Relative Index: INVALID (ref 0.0–2.5)
Total CK: 25 U/L (ref 7–177)

## 2010-09-02 LAB — URINE MICROSCOPIC-ADD ON

## 2010-09-02 LAB — POCT CARDIAC MARKERS: Troponin i, poc: <0.05 ng/mL (ref 0.00–0.09)

## 2010-09-02 LAB — MAGNESIUM: Magnesium: 2.2 mg/dL (ref 1.5–2.5)

## 2010-10-03 ENCOUNTER — Encounter: Payer: Self-pay | Admitting: Internal Medicine

## 2010-10-07 ENCOUNTER — Ambulatory Visit: Payer: Self-pay | Admitting: Internal Medicine

## 2010-10-08 NOTE — H&P (Signed)
Abigail Castillo, ARP NO.:  [id number]   MEDICAL RECORD NO.:  [id number]          PATIENT TYPE:  EMS   LOCATION:  MAJO                         FACILITY:  [facility]   PHYSICIAN:  Ramiro Harvest, MD    DATE OF BIRTH:  10-16-1950   DATE OF ADMISSION:  11/09/2008  DATE OF DISCHARGE:                              HISTORY & PHYSICAL   PRIMARY CARE PHYSICIAN:  Barbette Hair. Konrad Saha, of [facility].   HISTORY OF PRESENT ILLNESS:  Abigail Castillo is a 60 year old white female  history of tobacco abuse ongoing, COPD, depression, anxiety, personality  disorder who presents to the ED with a one to 2-week history of  worsening shortness of breath which has been ongoing for the past month,  some wheezes, productive cough of yellowish and greenish sputum, chills,  decreased oral intake, decreased appetite, subjective fevers and  generalized weakness.  The patient denies any chest pain, no nausea or  vomiting.  No abdominal pain, no diarrhea, no constipation.  No  palpitations, no melena or hematemesis.  No hematochezia.  No sick  contacts.  No focal neurological symptoms.  No dysuria.  The patient  endorses upper respiratory symptoms one month prior to admission with  onset of her shortness of breath.  The patient states she ran out of her  nebulizer treatments.  The patient presents to the ED.  Chest x-ray  obtained showed a new right middle lobe atelectasis versus early  infiltrate.  CBC with a white count of 10.8, hemoglobin 8.6, otherwise  within normal limits.  I-stat-8 with a sodium of 133, chloride of 92,  glucose of 129, otherwise within normal limits.  Point of care cardiac  markers were negative.  Magnesium was within normal limits.  Urinalysis  was nitrite negative, leukocytes small, WBCs 3-6, EKG shows sinus  tachycardia with ST depression II, III, aVF.  The patient was given some  prednisone and nebulizers in the ED with some improvement in her  symptoms as well as  given some IV Rocephin and azithromycin.  We were  called to admit the patient.   ALLERGIES:  SULFA.   PAST MEDICAL HISTORY:  1. History of osteoporosis.  2. History of severe COPD.  3. Depression/anxiety.  4. History of personality disorder.  5. 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  6. Discoid lupus of the scalp.  7. Osteoporosis.   HOME MEDICATIONS.:  1. DuoNeb 0.5-2.5 q.i.d.  2. Pulmicort 0.25 mg b.i.d.  3. Calcium 600 plus D b.i.d.   SOCIAL HISTORY:  The patient is divorced with ongoing tobacco use,  stated that she quit when she got sick.  No alcohol.  No IV drug use.   FAMILY HISTORY:  Noncontributory.   REVIEW OF SYSTEMS:  As per HPI, otherwise negative.   PHYSICAL EXAMINATION:  VITAL SIGNS:  Temperature 98.6, blood pressure  110/78, pulse of 111 to 104, respirations 22, satting 90%  on room air.  GENERAL:  Patient in no apparent distress.  HEENT:  Normocephalic, atraumatic.  Pupils equal, round and reactive to  light and accommodation.  Extraocular movements intact.  Oropharynx is  clear.  No lesions, no exudates.  NECK:  Supple.  No lymphadenopathy.  Dry mucous membranes.  RESPIRATORY:  Poor air movement, expiratory wheezes.  CARDIOVASCULAR:  Tachycardiac, regular rhythm.  ABDOMEN:  Soft, nontender, nondistended.  Positive bowel sounds.  EXTREMITIES:  No clubbing, cyanosis or edema.  NEUROLOGICAL:  The patient is alert and oriented x3.  Cranial nerves II-  XII are grossly intact.  No focal deficits.   LABORATORY DATA:  I-stat-8 with sodium of 133, potassium 3.7, chloride  92, glucose 129, BUN 10, creatinine 0.8.  CBC with a white count of  10.8, platelets of 286, hematocrit of 13.8, hematocrit of 41.1, ANC of  8.6.  Point of care cardiac markers CK-MB less than one, troponin I less  than 0.05, myoglobin  of 65.3, magnesium of 2.2.  UA was cloudy, specific  gravity 1.028, pH of 6, glucose negative, bilirubin moderate, ketones  40, protein negative, urobilinogen 2.0, nitrite negative, leukocytes  small.  Urine microscopy WBCs 3-6, RBCs 0-2.  Initial set of cardiac  enzymes were negative.  EKG showed a sinus tachycardia, nonspecific ST  depression in II, III and aVF.   ASSESSMENT:  1. Abigail Castillo is a 60 year old female with history of ongoing      tobacco use, history of COPD, depression, anxiety, coming to the ED      with a 1-2 week history of worsening shortness of breath.  Will      admit the patient.  Check a sputum Gram stain and culture.  Check a      comprehensive metabolic profile.  Check coags.  Check a urine      Legionella and pneumococcus antigen.  Will place on O2, IV Avelox,      IV steroids, nebulizer treatments, Tessalon Perles and follow.  2. Dehydration.  Hydrate with IV fluids.  3. Probable pneumonia per chest x-ray.  Check a sputum Gram stain and      culture.  Check a urine Legionella and pneumococcus antigen.  IV      Avelox.  4. Depression/anxiety, stable.  5. Personality disorder.  6. Osteoporosis.  7. Prophylaxis.  Protonix for GI prophylaxis.  Lovenox for DVT      prophylaxis.   It has been a pleasure taking care of Abigail Castillo.      Ramiro Harvest, MD  Electronically Signed     DT/MEDQ  D:  11/09/2008  T:  11/09/2008  Job:  [id number]   cc:   Barbette Hair. Artist Pais, DO

## 2010-10-08 NOTE — Discharge Summary (Signed)
Abigail Castillo, Abigail Castillo                ACCOUNT NO.:  [id number]   MEDICAL RECORD NO.:  [id number]          PATIENT TYPE:  INP   LOCATION:  4712                         FACILITY:  [facility]   PHYSICIAN:  Rosalyn Gess. Norins, MD  DATE OF BIRTH:  Feb 02, 1951   DATE OF ADMISSION:  11/08/2008  DATE OF DISCHARGE:  11/11/2008                               DISCHARGE SUMMARY   PRIMARY CARE PHYSICIAN:  Barbette Hair. Artist Pais, DO, [employer].   ADMITTING DIAGNOSES:  1. Chronic obstructive pulmonary disease exacerbation.  2. Pneumonia, right lower lobe.  3. Chronic obstructive pulmonary disease.  4. Depression and anxiety.  5. Osteoporosis.   DISCHARGE DIAGNOSES:  1. Chronic obstructive pulmonary disease exacerbation.  2. Pneumonia, right lower lobe.  3. Chronic obstructive pulmonary disease.  4. Depression and anxiety.  5. Osteoporosis.   CONSULTANTS:  None.   PROCEDURES:  1. Chest x-ray on November 07, 2005, revealing severe emphysema.      Interstitial opacity in lung bases, increased from last year's exam      raising the possibility of atypical pneumonia or interstitial      edema.  2. Chest x-ray on June 16, which revealed severe chronic obstructive      pulmonary disease.  New right middle lobe atelectasis versus early      infiltrate.   HISTORY OF PRESENT ILLNESS:  The patient is a 60 year old Caucasian  woman with history of tobacco abuse, which is ongoing, severe  COPD/emphysema, depression, anxiety, and personality disorder, who  presents to the emergency department with a 2-week history of worsening  shortness of breath with wheezing, productive cough of yellowish-green  sputum, chills, decreased oral intake, decreased appetite, subjective  fevers, and generalized weakness..  The patient denied any chest pain,  nausea, or vomiting.  She had no abdominal pain, diarrhea, or  constipation.  She denied palpitations and had no melena, hematemesis,  or hematochezia.  Evaluation revealed the  patient in the emergency  department to have changes on x-ray suggestive of the right middle lobe  infiltrate.  White count was 10,800, hemoglobin 8.6 g, otherwise within  normal limits.  Sodium 133 and glucose was 129.  Urinalysis was negative  with small leukocytes and wbc's 3-6.  EKG revealed sinus tachycardia  with ST depression in III, III, and aVF suggestive of strain.  The  patient was subsequently admitted for IV steroids and antibiotics.   HOSPITAL COURSE:  1. COPD exacerbation.  The patient was started on IV steroids and      handheld nebulizer treatments.  On this regimen, she seemed to      stabilize and improve.  On the day of discharge, the patient had no      wheezing and was able to ambulate with an O2 sat of 93% or higher.  2. Pneumonia.  Question of right middle lobe pneumonia on chest x-ray,      which was a soft call.  The patient never demonstrated      leukocytosis.  She was started on Avelox.  The patient had      Legionella antigen that  was negative.  Urine culture was no growth      at the time of discharge.  The patient's white count remained      normal.  Urine for strep pneumoniae antigen was negative.  With the      patient having no fever, no leukocytosis, at this point she may be      discharged home on oral doxycycline 100 mg p.o. b.i.d. for an      additional 10 days.  3. Cardiac:  The patient did have a ST depression, most likely      consistent with strain related to her respiratory disease.  Point-      of-care cardiac markers in the emergency department were negative.      Cardiac panel with a CK of 25, the second panel with a CK of 30      with a troponin of 0.01, and third panel with a CK of 26 with a      troponin of 0.01.  The patient denied any chest pain or chest      discomfort.  With cardiac enzymes being negative, no further      evaluation is indicated at this time.  The patient's other medical      problems remained stable including her  anxiety, depression,      osteoporosis, and peripheral vascular disease.   DISCHARGE EXAMINATION:  VITAL SIGNS:  Temperature is 97.7, blood  pressure 125/85, heart rate 76, respirations 18, and O2 sats 98% on 2 L.  GENERAL APPEARANCE:  This is a very thin woman looking significantly  older than her stated chronologic age.  HEENT:  The patient had mild temporal wasting.  NECK:  Supple.  CHEST:  The patient is moving air well.  I did not appreciate any  wheezing.  There was no use of accessory muscles of respiration.  CARDIOVASCULAR:  2+ radial pulse.  Her precordium was quiet with a  regular rate and rhythm.  No further examination conducted.   With the patient being stable from respiratory perspective having ruled  out for cardiac event, she is now stable for discharge home on oral  medications.   DISCHARGE MEDICATIONS:  The patient will continue with Spiriva 18 mcg 1  inhalation daily.   The patient will use her home nebulizers, which are;  1. DuoNeb 0.5/2.5 four times daily.  2. Pulmicort 0.25 mg inhaler b.i.d.  3. Calcium 600 mg b.i.d.   ADDITIONAL MEDICATIONS:  1. Doxycycline 100 mg p.o. b.i.d. for 10 days.  2. Prednisone 20 mg b.i.d. for 3 days, 30 mg daily for 3 days, 20 mg      daily for 3 days, 10 mg daily for 6 days, and then stop.   DISPOSITION:  The patient is discharged to home.  She is to follow up  with Dr. Thomos Lemons in 2-3 weeks.  She is to call sooner for any change  in condition.  The patient's condition at the time of discharge  dictation is improved and stable.      Rosalyn Gess Norins, MD  Electronically Signed     MEN/MEDQ  D:  11/11/2008  T:  11/11/2008  Job:  [id number]

## 2010-10-08 NOTE — H&P (Signed)
NAMECLEMENCIA, HELZER NO.:  [id number]   MEDICAL RECORD NO.:  [id number]          PATIENT TYPE:  EMS   LOCATION:  MAJO                         FACILITY:  [facility]   PHYSICIAN:  Ramiro Harvest, MD    DATE OF BIRTH:  12-08-1950   DATE OF ADMISSION:  11/08/2008  DATE OF DISCHARGE:                              HISTORY & PHYSICAL   ADDENDUM TO JOB NUMBER [id number]:   INITIAL ADMISSION ASSESSMENT AND PLAN:  EKG changes seen on EKG.  We  will cycle cardiac enzymes q.8 h., x3, check a TSH , repeat an EKG in  the morning and depending on what the enzymes show if patient rules and,  may likely need a cardiology consult.  We will follow for now.      Ramiro Harvest, MD  Electronically Signed     DT/MEDQ  D:  11/09/2008  T:  11/09/2008  Job:  [id number]   cc:   Barbette Hair. Artist Pais, DO

## 2010-10-11 NOTE — H&P (Signed)
NAMEQUENTIN, Abigail Castillo NO.:  [id number]   MEDICAL RECORD NO.:  [id number]                   PATIENT TYPE:  INP   LOCATION:  0102                                 [facility]ACILITY:  [facility]   PHYSICIAN:  Jackie Plum, M.D.             DATE OF BIRTH:  1951/01/02   DATE OF ADMISSION:  05/03/2002  DATE OF DISCHARGE:                                HISTORY & PHYSICAL   PROBLEM LIST:  1. Chronic obstructive pulmonary disease exacerbation secondary to     bronchitis, rule out pneumonia.  2. Diarrhea.  3. Dehydration, mild, secondary to problem #2.  4. Abnormal CT scan (preliminary CT scan done today notable for negative for     pulmonary embolus with hilar node, official report pending).  5. Lupus by history.   CHIEF COMPLAINT:  Shortness of breath for three days.   HISTORY OF PRESENT ILLNESS:  The patient is a 60 year old lady with a  history of lupus who has not been following up for several years who  presents with a three day history of shortness of breath.  She also  complains of generalized weakness and muscle aches.  Her appetite has been  decreased with decreased p.o. intake.  She complains of cough which is  thought to be moderate to severe with productive sputum which is yellowish-  greenish in color.  She denies history of fever, chills, chest pains, sore  throat, sinus drainage, nausea, vomiting, abdominal pain, bright red blood  per rectum, or melenic stools, but admits to diarrhea, lightheadedness,  chills, and headaches.  She denies any dysuria, but admits to frequency of  urination.  Diarrheal stools are described as watery without any blood or  mucus, has had only about three episodes since her emesis started.  She did  not take any antibiotics.  She denies eating an unusual foods.  There is no  history of recent contacts with similar symptomatology.  She denies any  weight loss (alleges that she has always been a thin person).   PAST MEDICAL  HISTORY:  She was diagnosed with lupus about 15 years ago, but  has not had any followup.   ALLERGIES:  No known drug allergies.   MEDICATIONS:  She is not on any regular medications.   FAMILY HISTORY:  Notable for heart disease in mother, and cancer in sister.   SOCIAL HISTORY:  She is divorced, has three older children.  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  She smokes cigarettes one pack a  day for more then 56 years.  She does not drink alcohol.   REVIEW OF SYMPTOMS:  Significant positives and negatives are as stated in  the HPI.  Otherwise, review of systems is unremarkable.   PHYSICAL EXAMINATION:  VITAL SIGNS:  Blood pressure 102/54, pulse rate of  113 per minute, respiratory rate of 24 to 28 per minute,  O2 saturation was  85% on room air, and 96% with oxygen.  Temperature was 99.9 degrees  Fahrenheit on presentation.  GENERAL:  A thin built Caucasian lady laying on the stretcher in mild  respiratory distress with her family at her bedside.  HEENT:  Normocephalic, atraumatic head.  No pallor.  No scleral icterus.  Pupils equal, round, reactive to light.  Extraocular movements were intact.  Oropharynx was dry without any obvious exudates or erythema.  Tympanic  membranes were within normal limits.  NECK:  Supple, no thyromegaly.  Nodes were not palpable.  LUNGS:  Diffuse expiratory wheezes, decreased breath sounds.  CARDIAC:  Tachycardia which was mild, without any gallops or murmurs.  ABDOMEN:  Sunken abdomen, bowel sounds were present, no tenderness was  appreciated.  No organomegaly was appreciated.  EXTREMITIES:  Absence of any cyanosis or clubbing.  No edema.  CNS:  The patient was clinically alert and oriented x3, without any focal  deficits.   LABORATORY DATA:  Chest x-ray reported as showing chronic obstructive  pulmonary disease with acute infiltrates not available for my review.  EKG  notable for tachycardia at 115 beats per minute, no obvious  acute ST-T waves  are appreciated.  pH was 7.36, PCO2 43, PO2 78, bicarbonate 24, O2  saturation 95% on 2 L of oxygen.  White blood cell count 4.6, hemoglobin  13.4, hematocrit 39.4, MCV 94.3, platelet count 173.  Neutrophil count was  81%.  Sodium 136, potassium 4.3, chloride 102, CO2 24, glucose 121, BUN 13,  creatinine 0.8, calcium 8.9.  Stat protein 7.2, albumin 3.9, AST 30, ALT 16,  alkaline phosphatase 86, bilirubin total 1.1.  Urinalysis many epithelial  cells.  Urine white blood cells per high powered field 0 to 2, urine red  blood cell 0 to 2 red blood cell per high powered field, bacteria few,  yellow, cloudy.  Specific gravity 1.031, pH 5.5, glucose negative,  hemoglobin trace, bilirubin small, ketones more then 18, total protein 13,  urobilinogen 0.2, nitrite negative, leukocyte esterase negative.  ICTO test  negative.  Total CK 188, MB 4.6, troponin-I 0.01.   ASSESSMENT:  1. Chronic obstructive pulmonary disease exacerbation secondary to     bronchitis.  Probably viral etiology.  2. Dehydration.  3. Diarrhea.   PLAN:  Admit the patient to a telemetry bed on account of her tachycardia.  We will start nebulizer treatment with IV antibiotics, steroids, oxygen, and  other fluid supplementation.  We will repeat the chest x-ray with two views  tomorrow.  We will also obtain a stool workup if diarrhea persist while the  patient is in the hospital.  We will follow up on the CT scan of the chest.                                                 Jackie Plum, M.D.    GO/MEDQ  D:  05/03/2002  T:  05/03/2002  Job:  [id number]

## 2010-10-11 NOTE — Discharge Summary (Signed)
NAMEHARMONIE, Castillo NO.:  [id number]   MEDICAL RECORD NO.:  [id number]                   PATIENT TYPE:  INP   LOCATION:  0358                                 FACILITY:  [facility]   PHYSICIAN:  Sherin Quarry, MD                   DATE OF BIRTH:  [birth year]   DATE OF ADMISSION:  05/03/2002  DATE OF DISCHARGE:                                 DISCHARGE SUMMARY   HISTORY OF PRESENT ILLNESS:  The patient is a 60 year old lady who had not  seen a doctor for several years.  She presented to the emergency room on the  day of admission with a three-day history of shortness of breath associated  with weakness, anorexia, and cough productive of yellowish-green phlegm.  There was no associated history of fevers, chills, or chest pain.  There was  no nausea, vomiting, or diarrhea.   PAST MEDICAL HISTORY:  This is said to be remarkable for a diagnosis of  lupus 15 years ago but apparently she never went back to a doctor and never  had any subsequent followup.  Also of note is that the patient smokes one to  two packs of cigarettes per day.  She is currently unemployed.   PHYSICAL EXAMINATION ON ADMISSION:  VITAL SIGNS:  Blood pressure 102/54,  pulse rate 113, respiratory rate 28.  O2 saturation was 85%.  Temperature  was 99.9.  GENERAL:  The patient was a thin white female who was in moderate  respiratory distress.  HEENT:  Within normal limits.  CHEST:  Diffuse expiratory wheezes with decreased breath sounds.  CARDIOVASCULAR:  Tachycardia.  There were no gallops or murmurs.  ABDOMEN:  Benign.  EXTREMITIES:  No evidence of cyanosis or edema.   IMAGING STUDIES:  A chest x-ray obtained showed a right lower lobe pneumonia  with changes of chronic obstructive pulmonary disease.  A CT scan of the  chest was done.  This showed no signs of pulmonary embolus.  There were  small, probably insignificant mediastinal lymph nodes.  One of these was 1.5  x 2 cm in size and  was thought to be a reactive node.  It was a suggestion  of the radiologist that a chest CT be obtained in three to four months to  follow up on this finding.   LABORATORY DATA:  Laboratory studies obtained included a CBC which revealed  a hemoglobin of 11.5, white count 6700.  Potassium level was initially 2.8.  White count was 4600, the hemoglobin was 13, hematocrit was 39.  Blood  cultures were negative.   HOSPITAL COURSE:  On admission, Dr. Julio Sicks placed the patient on  Zithromax 500 mg IV daily, oxygen, nebulizer treatments with Xopenex and  Atrovent, and Solu-Medrol 125 mg every 6 hours.  Subsequently, Rocephin 1  gram every 24 hours was added to this regimen.  The patient's course was  one  of very gradual improvement over a period of eight days.  By May 11, 2002, her respiratory status was substantially improved.  She was no longer  experiencing respiratory distress.  She was breathing comfortably without  oxygen.  I discussed with her the extreme importance of discontinuing  cigarette smoking.  She seemed to understand these concerns.  On May 11, 2002, she was discharged.   DISCHARGE DIAGNOSES:  1. Pneumonia.  2. Chronic obstructive pulmonary disease.  3. Dehydration.  4. Abnormal hilar lymph node.  5. Lupus by history.   DISCHARGE MEDICATIONS:  1. Prednisone 30 mg daily x2, 20 mg daily x3, and 10 mg daily x3.  2. Zithromax 250 mg x5 more days.  3. Ceftin 500 mg b.i.d. x5 more days.  4. Combivent inhaler three puffs q.i.d.  5. I offered the patient Wellbutrin to help her discontinue cigarette     smoking but she said that she did not think she would take it.   FOLLOW UP:  I encouraged the patient to follow up with [facility].  Once again just to make the point, the radiologist had  recommended in three to four months that a followup CT scan of the chest be  obtained.   CONDITION ON DISCHARGE:  Good.                                                Sherin Quarry, MD    SY/MEDQ  D:  05/11/2002  T:  05/12/2002  Job:  [id number]   cc:   [employer]

## 2010-10-11 NOTE — Assessment & Plan Note (Signed)
[facility]                           PRIMARY CARE OFFICE NOTE   NAME:Abigail Castillo, Abigail Castillo                       MRN:          [id number]  DATE:05/12/2006                            DOB:          1950-08-27    CHIEF COMPLAINT:  New patient to practice.   HISTORY OF PRESENT ILLNESS:  The patient is a 60 year old white female  with a past medical history of COPD and chronic tobacco use, here to  establish primary care.  She was formerly followed by Dr. Katrinka Blazing in  [place], who has recently retired.  She has greater than a 50 pack-  year history and was admitted to the hospital in June of 2007 with COPD  exacerbation.  She has been encouraged to discontinue smoking.  However,  it has been difficult for her to decrease her tobacco use.  The  patient's daughter is accompanying her today and notes that she  chronically coughs, and there has been audible wheezing.   She has not had any worsening of her cough recently or increased sputum  production.  She has taken Advair in the past, also an unknown inhaler,  which has helped her symptoms of shortness of breath/chest tightness.   Her other past medical history is significant for depression and  possible personality disorder.  She has been prescribed Celexa and  Wellbutrin in the past.  She states that she does not like to take her  medications, especially her psychiatric medications, and they have been  discontinued by the patient.  Daughter notes that mother has a fear of  driving, but has declined followup with a counselor.   PAST MEDICAL HISTORY SUMMARY:  1. History of chronic bronchitis/COPD.  2. History of depression/anxiety.  3. Possible personality disorder.  4. Status post lumpectomy 15 years ago.  5. Status post cholecystectomy.  6. Status post uterine biopsy 2 years ago.   CURRENT MEDICATIONS:  None.   ALLERGIES:  SULFA.   SOCIAL HISTORY:  The patient is divorced, disabled.  She has a total  of  3 children.  Daughter, who is accompanying her today, and 2 sons.   FAMILY HISTORY:  Mother deceased at age 38 secondary to coronary artery  disease.  Father deceased at age 16, also with coronary artery disease.  Grandfather was known to be diabetic, and a sister and niece are known  to have breast cancer.   HABITS:  No alcohol.  Currently smokes anywhere from 3/4 pack to 1 pack  per day.  No report of any alcohol use.   REVIEW OF SYSTEMS:  No fevers or chills.  No HEENT symptoms.  Denies any  chest pain.  Positive dyspnea on exertion.  Positive cough.  Positive  wheezing.  Positive hoarseness.  Occasional heartburn.  No dysuria,  frequency, urgency.  Positive mild weight loss.  All other systems  negative.   PHYSICAL EXAM:  VITAL SIGNS:  Height is 5 feet 2 inches, weight is 106  pounds, temperature is 97.4, pulse is 78, and BP is 118/72.  GENERAL:  The patient is a thin, somewhat cachectic 60 year old  white  female who appears much older than stated age.  HEENT:  Normocephalic, atraumatic.  Pupils equal, round, and reactive to  light bilaterally.  Extraocular motility was intact.  The patient was  anicteric.  Conjunctivae within normal limits.  External auditory canals  and tympanic membranes were clear bilaterally.  Oropharyngeal exam  reveals lower dental caries.  No dentition upper.  NECK:  Supple.  No adenopathy, carotid bruit, or thyromegaly.  CHEST:  Diffuse expiratory wheezing with decreased breath sounds at the  bases.  CARDIOVASCULAR:  Regular rate and rhythm.  No significant murmurs, rubs,  or gallops appreciated.  ABDOMEN:  Soft and nontender.  Positive bowel sounds.  No organomegaly.  MUSCULOSKELETAL:  No cyanosis, clubbing, or edema.  The patient had  diminished pedis dorsalis pulses.  NEUROLOGIC:  Cranial nerves 2-12 are intact.  She was nonfocal.   IMPRESSION/RECOMMENDATIONS:  1. Chronic asthmatic bronchitis/chronic obstructive pulmonary disease.  2. Ongoing  tobacco use.  3. History of personality disorder/depression.  4. Family history of breast cancer.  5. Chronic hoarseness.  6. Health maintenance.   RECOMMENDATIONS:  We discussed at length the natural progression of  COPD.  The patient was strongly encouraged to discontinue smoking.  She  was given a prescription for Chantix starter pack.   She was restarted on her inhalers.  She will be started on Advair 250/50  1 dose b.i.d., as well as Combivent q.i.d.  We will need to obtain a  pulmonary function test and will arrange followup with [facility].   Her frequent heartburn symptoms may also be exacerbating her respiratory  status, and she will be started on Protonix 40 mg once a day.   Lastly, she was sent for routine labs today.  At followup visit, we will  discuss the need for followup mammogram, as well as possible referral to  ENT for chronic hoarseness.     Barbette Hair. Artist Pais, DO  Electronically Signed    RDY/MedQ  DD: 05/12/2006  DT: 05/12/2006  Job #: [id number]

## 2010-10-11 NOTE — Op Note (Signed)
NAME:  Abigail Castillo, Abigail Castillo                          ACCOUNT NO.:  [id number]   MEDICAL RECORD NO.:  [id number]                   PATIENT TYPE:  INP   LOCATION:  A312                                 FACILITY:  [facility]   PHYSICIAN:  Dirk Dress. Katrinka Blazing, M.D.                DATE OF BIRTH:  November 20, 1950   DATE OF PROCEDURE:  DATE OF DISCHARGE:                                 OPERATIVE REPORT   PREOPERATIVE DIAGNOSIS:  Cholelithiasis, cholecystitis.   POSTOPERATIVE DIAGNOSIS:  Cholelithiasis, cholecystitis.   PROCEDURE:  Laparoscopic cholecystectomy.   SURGEON:  Dirk Dress. Katrinka Blazing, M.D.   DESCRIPTION OF PROCEDURE:  Under general endotracheal anesthesia the  patient's abdomen was prepped and draped in a sterile field. A  supraumbilical midline incision was made.  A Veress needle was inserted  uneventfully.  The abdomen was insufflated with 2 liters of CO2.  Using a  Vis-A-Port guide a 10-mm port was placed uneventfully. Laparoscope was  placed.   Under videoscopic guidance a 10-mm port and two 5-mm ports were placed in  the right subcostal region.  The gallbladder was grasped and positioned.  Adhesions to the dome and fundus of the gallbladder were dissected with  electrocautery and with blunt dissection.  Dissection was continued down to  the cystic duct.  There were 3 cystic artery branches.  Each was clipped  with 3 clips and divided.  The cystic duct was dissected back to the  gallbladder.  At the more proximal segment of the duct, the duct was clipped  with 4 clips and divided. Using electrocautery the gallbladder was then  separated from the gallbladder bed without difficulty.  The gallbladder was  grasped and retrieved without difficulty.  There was no bleeding in the bed.  Irrigation was totally clear.   CO2 was allowed to escape from the abdomen and the ports were removed.  The  incisions were closed using #0 Dexon on the fascia of the supraumbilical  incision and staples on the skin of the  other incisions.  The patient  tolerated the procedure well.  She was awakened, dressings were placed.  She  was awakened from anesthesia uneventfully, transferred to a bed, and taken  to the postanesthetic care unit.      ___________________________________________                                            Dirk Dress. Katrinka Blazing, M.D.   LCS/MEDQ  D:  11/10/2003  T:  11/11/2003  Job:  [id number]

## 2010-10-11 NOTE — Discharge Summary (Signed)
NAME:  Abigail Castillo, Abigail Castillo                          ACCOUNT NO.:  [id number]   MEDICAL RECORD NO.:  [id number]                   PATIENT TYPE:  INP   LOCATION:  A312                                 FACILITY:  [facility]   PHYSICIAN:  Dirk Dress. Katrinka Blazing, M.D.                DATE OF BIRTH:  [birth year]   DATE OF ADMISSION:  11/06/2003  DATE OF DISCHARGE:  11/12/2003                                 DISCHARGE SUMMARY   DISCHARGE DIAGNOSES:  1. Cholelithiasis, cholecystitis.  2. Personality disorder with paranoia.  3. Hypokalemia.  4. Weight loss due to number one.  5. Chronic obstructive lung disease.  6. Discoid lupus of the scalp.   SPECIAL PROCEDURE:  Laparoscopic cholecystectomy.   DISPOSITION:  The patient was discharged to home in stable and satisfactory  condition.   DISCHARGE MEDICATIONS:  1. Lexapro 20 mg every day.  2. Lorazepam 0.5 mg b.i.d.  3. Tylenol 500 mg q.6-8h. p.r.n. pain.  4. Hydrocortisone 2.5% to scalp t.i.d.   The patient is also advised to have followup with [facility] on November 24, 2003.   SUMMARY:  A 60 year old female admitted for evaluation of recurrent  abdominal pain, post prandial, nausea, vomiting, and chronic diarrhea.  The  patient states that she has had abdominal discomfort for many months but it  had become more prominent over the past month.  She states that even small  bites of food cause her to have nausea with occasional vomiting.  She was  admitted for further evaluation and treatment.   PAST HISTORY:  1. The patient had conization of her cervix in early June for high grade     squamous intraepithelial neoplasia.  2. She also had a core needle biopsy of a breast mass, in April 2005, which     showed intraductal papilloma.  3. There is a history of chronic bronchitis with emphysema.   PHYSICAL EXAMINATION:  GENERAL:  She is a thin frail-appearing female.  VITAL SIGNS:  Blood pressure 100/60, pulse 88, respirations 18,  temperature  97.2.  HEENT:  Noted that her hair had been cut off at different lengths.  She  states that she did this herself.  There was a bald spot over the occipital  region measuring 3 x 4-cm with slightly raised skin but no evidence of  ulcerations.  NECK:  Supple.  LUNGS:  Clear.  HEART:  Regular without S3 or gallop.  ABDOMEN:  Soft with mild epigastric tenderness.  EXTREMITIES:  With 1+ edema.  NEUROLOGIC:  Nonfocal, though psychologically she did had a very chaotic  train of thought.  She could not answer questions in a logical manner.  She  was quite paranoid and in answering most of her questions she reflected on  many events that occurred many years ago.   The patient was admitted for evaluation of her abdominal pain, nausea,  vomiting,  and diarrhea.  She also had significant weight loss.  She was  admitted, started on IV fluids for hydration.  Baseline labs were drawn.  Stool cultures were obtained.  With hydration and with control of her  nausea, the patient felt much better but with a significant element of  depression.  She was started on Prozac for this.  A CT of the chest and  abdomen was ordered and it showed gallstones.  A C. difficile of the stool  was negative.  CEA was normal.  Serum potassium was low and this was  collected.  She had mild anemia.  She was seen by [facility] and it  was their opinion that she needed to have followup as an outpatient for her  psychiatric illness.  On November 10, 2003, a laparoscopic cholecystectomy was  done uneventfully.  She was found to have a thickened gallbladder with  multiple stones.  She did well in the postoperative period.  She was able to  eat much better.  Her nausea and vomiting were resolved.  She was discharged  home on the 19th in satisfactory condition, much improved from the time of  admission.     ___________________________________________                                         Dirk Dress Katrinka Blazing,  M.D.   LCS/MEDQ  D:  12/09/2003  T:  12/09/2003  Job:  [id number]

## 2010-10-11 NOTE — H&P (Signed)
Abigail Castillo, Abigail Castillo                            ACCOUNT NO.:  [id number]   MEDICAL RECORD NO.:  [id number]                   PATIENT TYPE:   LOCATION:                                       FACILITY:   PHYSICIAN:  Dirk Dress. Katrinka Blazing, M.D.                DATE OF BIRTH:   DATE OF ADMISSION:  DATE OF DISCHARGE:                                HISTORY & PHYSICAL   A 60 year old female with history of recurrent severe diarrhea with fecal  incontinence nonresponsive to treatment.  She has had multiple variations in  her medications over the past two months.  She is scheduled for screening  colonoscopy.   PAST MEDICAL HISTORY:  1.  Chronic bronchitis.  2.  Personality disorder.  3.  __________ of the scalp.   PAST SURGICAL HISTORY:  1.  Laparoscopic cholecystectomy.  2.  Core needle biopsy left breast.   MEDICATIONS:  1.  Lexapro 20 mg daily.  2.  Multivitamin one daily.  3.  Phenergan 25 mg suppository q.6h. p.r.n.  4.  Lorazepam 0.5 mg b.i.d.   SOCIAL HISTORY:  She is a divorced mother of three.  History of alcoholism.  She smokes a pack of cigarettes per day.  She does not drink or use drugs.   ALLERGIES:  SULFA.   FAMILY HISTORY:  Positive for schizophrenia, alcoholism, depression, stroke,  diabetes, atherosclerotic heart disease, and hypertension.   PHYSICAL EXAMINATION:  VITAL SIGNS:  Blood pressure 90/60, pulse 78,  respirations 20, weight 99 pounds.  HEENT:  Unremarkable except for area of 4-5 cm of rough skin with absent  hair over the central region of the scalp.  ENT is otherwise unremarkable.  NECK:  Supple.  No JVD or bruit.  CHEST:  Clear to auscultation.  HEART:  Regular rate and rhythm without murmurs, rubs, or gallops.  ABDOMEN:  Soft, nontender.  No masses.  EXTREMITIES:  No clubbing, cyanosis, edema.  NEUROLOGIC:  No focal motor, sensory, or cerebellar deficits.   IMPRESSION:  1.  Unexplained recurrent diarrhea.  2.  Chronic bronchitis probably cigarette  related.  3.  Personality disorder with paranoia.  4.  Chronic obstructive lung disease.  5.  __________ of the scalp.   PLAN:  Colonoscopy.     ___________________________________________                                         Dirk Dress. Katrinka Blazing, M.D.   LCS/MEDQ  D:  02/08/2004  T:  02/08/2004  Job:  [id number]   cc:   [facility]  Fax: [phone]

## 2010-10-11 NOTE — Op Note (Signed)
NAME:  Abigail Castillo, Abigail Castillo                          ACCOUNT NO.:  [id number]   MEDICAL RECORD NO.:  [id number]                   PATIENT TYPE:  AMB   LOCATION:  DAY                                  FACILITY:  [facility]   PHYSICIAN:  Lazaro Arms, M.D.                DATE OF BIRTH:  1950-07-26   DATE OF PROCEDURE:  10/26/2003  DATE OF DISCHARGE:                                 OPERATIVE REPORT   PREOPERATIVE DIAGNOSIS:  High-grade squamous intraepithelial lesion of the  cervix.   POSTOPERATIVE DIAGNOSIS:  High-grade squamous intraepithelial lesion of the  cervix.   PROCEDURE:  Cold-knife conization of the cervix using holmium laser and  Monsel's for hemostasis.   SURGEON:  Lazaro Arms, M.D.   ANESTHESIA:  Laryngeal mask airway.   ESTIMATED BLOOD LOSS:  100 cc.   FINDINGS:  The patient had an abnormal Pap smear and was referred to our  office.  I did a colposcopy with directed biopsy.  She had a very firm  cervix.  I was concerned about invasive carcinoma.  It came back as high-  grade dysplasia.  As a result, because of my concerns, a cold-knife  conization is performed for both diagnostic and therapeutic purposes.   DESCRIPTION OF PROCEDURE:  The patient is taken to the operating room and  placed in the supine position where she underwent laryngeal mask airway.  She was placed in the dorsal lithotomy position and prepped and draped in  the usual sterile fashion.  The bladder was drained.   Her cervix was grasped.  Lugol's solution was used, and then ___________  area was noted.  A 10 blade on a long handle was employed, and a  circumferential incision was made, getting a good margin around all of the  nonstaining areas.  It was taken down to a depth of approximately 2 cm in a  conical fashion.  Hemostasis was achieved using the laser and Monsel's.  I  then finished and went to the recovery room, but the patient was still  bleeding.  I came back in, and she was bleeding from the  anterior cervical  lip.  A ___________ retention suture was placed anteriorly, and more  Monsel's was placed.  There was good hemostasis achieved.   The patient was awakened from anesthesia and taken to the recovery room in  good and stable condition.  All counts were correct x3.      ___________________________________________                                            Lazaro Arms, M.D.   LHE/MEDQ  D:  10/26/2003  T:  10/26/2003  Job:  [id number]

## 2010-10-11 NOTE — H&P (Signed)
NAME:  Abigail Castillo, Abigail Castillo                          ACCOUNT NO.:  [id number]   MEDICAL RECORD NO.:  [id number]                   PATIENT TYPE:  INP   LOCATION:  A312                                 FACILITY:  [facility]   PHYSICIAN:  Dirk Dress. Katrinka Blazing, M.D.                DATE OF BIRTH:  [birth year]   DATE OF ADMISSION:  11/06/2003  DATE OF DISCHARGE:                                HISTORY & PHYSICAL   The patient is a 60 year old female who was admitted for evaluation of  recurrent abdominal pain, postprandial nausea with vomiting, and chronic  diarrhea.  The patient states that she has had abdominal discomfort off and  on for many months, but it has become much more prominent over the past  month.  She has been followed by our P.A. and has had a fairly thorough  evaluation and workup.  She now states that even small bites of food cause  her to have nausea.  She occasionally vomits.  Her psychosocial history is  not quite clear, but it may be contributing to her problems.  It is felt  that the patient has been evaluated as much as possible as an outpatient,  and inpatient treatment is necessary in order to fully evaluate her and get  her under better treatment.   PAST MEDICAL HISTORY:  1. The patient recently had conization for high-grade intraepithelial     neoplasia.  2. According to available note, she had a core needle biopsy of a breast     mass in April 2005 which showed intraductal papilloma with fibroids and     nematoid change.  There is a history suggesting developing chronic     bronchitis and emphysema from cigarettes smoking.  She has had altered     mental status throughout the time that she has been evaluated in our     clinic, and there may be a familial component to this.  No one is     available in order to verify her mental state.  It is probable that she     has an element of depression.   CURRENT MEDICATIONS:  1. Lexapro 20 mg daily.  2. Multivitamins 1 daily.  3. Imodium  4 mg a.c.   SOCIAL HISTORY:  She is divorced, mother of three.  She lives with her son  who has a history of alcoholism.  By history, she lives in a residence that  is not well suited for human habitation.  It is said that she has a 12-year  school education.  She smokes a pack of cigarettes per day.  She denies  alcohol and drug use.   ALLERGIES:  SULFA.   FAMILY HISTORY:  Positive for hypertension, heart disease, diabetes, stroke,  depression, schizophrenia, and alcoholism.   PHYSICAL EXAMINATION:  GENERAL: She is a very thin, frail-appearing female  who looks older than her stated age.  VITAL  SIGNS:  Blood pressure 100/60, pulse 88, respirations 18, temperature  97.2.  Oxygen saturation 96% on room air.  Weight 93 pounds.  HEENT:  She has cut all of her hair off.  She has different lengths of hair.  There is a bald spot over the occipital region that is about 3 x 4 cm with  slightly raised skin but without any ulceration or drainage.  Pupils are  equal and reactive.  Extraocular movement is conjugate and intact.  She is  edentulous.  Oropharynx is unremarkable.  There is no evidence of thrush.  Tympanic membranes are normal.  NECK:  Supple.  No JVD, bruit, adenopathy, or thyromegaly.  LUNGS:  Actually clear, though she has decreased air movement.  There is no  wheezing or rhonchi or rales.  HEART:  Regular rate and rhythm without murmur, gallop, or rub.  ABDOMEN:  Soft, flat, mild epigastric tenderness to palpation.  No palpable  masses.  EXTREMITIES:  1+ edema of ankles with small varicosities bilaterally.  NEUROLOGIC:  Nonfocal.  There is no motor, sensory, or cerebellar deficits.  Deep tendon reflexes are intact and symmetric.  Cranial nerves are intact.  Psychologically, she has very chaotic train of thought which borders  actually on the bizarre. She does not answer questions in a logical manner.  This may be her baseline mental status.  She also appears to be quite   paranoic and reflects on multiple events that occurred may years ago with  any question that is asked.   IMPRESSION:  1. Recurrent abdominal pain, nausea, vomiting, diarrhea.  Based on social     situation and living condition, must consider enteropathogenic bacterial     infection including Escherichia coli, Salmonella, etc.  2. Weight loss, possibly associated with #1.  To be further evaluated.  3. Probable depression, long term.  4. Personality disorder with paranoia and a schizoid type presentation.   PLAN:  1. The patient is admitted.  2. She will be started on IV fluids.  3. Will get baseline labs.  4. Will start her on IV hydration.  5. Will also try to get other history from family members.  6. She will need to have a CT of her abdomen and chest.  7. Will also check stool for C. difficile enteropathogenic bacteria and ovum     and parasites.  8. If no etiology is found, she will need bowel prep followed by     colonoscopy.     ___________________________________________                                         Dirk Dress. Katrinka Blazing, M.D.   LCS/MEDQ  D:  11/09/2003  T:  11/09/2003  Job:  [id number]

## 2010-10-11 NOTE — H&P (Signed)
[name], Abigail NO.:  [id number]   MEDICAL RECORD NO.:  [id number]          PATIENT TYPE:  INP   LOCATION:  1823                         FACILITY:  [facility]   PHYSICIAN:  Sherin Quarry, MD      DATE OF BIRTH:  1950/07/13   DATE OF ADMISSION:  11/06/2005  DATE OF DISCHARGE:                                HISTORY & PHYSICAL   HISTORY OF PRESENT ILLNESS:  Abigail Castillo is a 60 year old lady with  approximately a 50-pack-year smoking history, who states that for the last  several days she has had persistent cough associated with chest congestion  and wheezing.  For approximately 48 hours, she has experienced some nausea  and decreased appetite.  She has been staying in bed most of the last couple  of days.  This evening she took her antidepressant medication and then about  10 minutes later she got up and went to the bathroom, where she vomited and  then had a episode of fecal incontinence.  She said that she became very  dizzy and felt that she was going to faint.  Judging from her description, I  think that she did not actually lose consciousness, but felt that she was  going to do so.  She called 911 and was transported to the [facility] Emergency  Room.  On arrival, her blood pressure was 83/73, pulse was 128, O2  saturation 96%.  Chest x-ray was obtained which shows rather severe COPD.  There is no definite infiltrate.  Increased interstitial markings are  suggested.  Potassium is 3.3.  Hemoglobin was 15.3, creatinine 0.6.  White  count was 10,900.  In the emergency room, she has received a bolus of normal  saline.  Blood cultures were obtained and she was started on Zithromax and  Rocephin.  After observing her in the emergency room for several hours, it  was felt prudent to admit her to the hospital for further observation and  treatment.   PAST MEDICAL HISTORY:   MEDICATIONS:  As best I can determine, her current medications are:  1.  Celexa 40 mg daily.  2.   Wellbutrin 100 mg daily.  3.  She says she also has a round and square inhaler at home, but I gather      she does not use at consistently.   ALLERGIES:  She does not report any allergies.  Her chart suggests that she  is allergic to SULFA DRUGS.   OPERATIONS:  1.  She has had a previous laparoscopic cholecystectomy.  2.  A needle biopsy of the left breast.   MEDICAL ILLNESSES:  1.  COPD with chronic bronchitis.  2.  Dr. Michaelle Copas notes indicate that she is felt to have a personality      disorder and depression.  3.  She denies any past history of heart disease or stroke.   FAMILY HISTORY:  Notable for significant family history of stroke, diabetes  and coronary artery disease.   SOCIAL HISTORY:  She smokes about 1 pack of cigarettes per day.  She does  not use alcohol  or drugs.  She apparently lives with her son.  Dr. Michaelle Copas  notes suggest that her son has a history of alcohol abuse.   REVIEW OF SYSTEMS:  HEAD:  She denies headache or dizziness.  EYES:  She  denies visual blurring or diplopia.  EAR, NOSE  AND THROAT:  She denies  earache, sinus pain or sore throat.  CHEST:  She has a chronic nonproductive  cough with associated wheezing.  CARDIOVASCULAR:  Denies orthopnea, PND or  ankle edema.  GI:  See above.  There has been no hematemesis or melena.  GU:  Denies dysuria or urinary frequency.  NEUROLOGIC:  No history of seizure or  stroke.  ENDOCRINE:  Denies excessive thirst, urinary frequency or nocturia.   PHYSICAL EXAM:  GENERAL: She is an alert lady who is a poor historian, but  does not appear to be in acute distress.  HEENT: Exam is within normal limits.  CHEST: Remarkable for bibasilar rhonchi with expiratory wheezes.  BACK: Exam shows no CVA or point tenderness.  CARDIOVASCULAR: Normal S1 and S2, without rubs, murmurs or gallops.  ABDOMEN: Benign.  Bowel sounds were present.  There is no guarding or  rebound.  NEUROLOGIC: Testing is within normal limits.   EXTREMITIES: Examination of extremities shows no evidence of cyanosis or  edema.   IMPRESSION:  1.  Chronic obstructive pulmonary disease with acute bronchitis.  2.  Nausea and vomiting with near-syncopal episode.  3.  Transient hypotension, blood pressure now 113/73 after fluid bolus.  4.  Chronic fecal incontinence.  5.  History of cholecystectomy.  6.  Mild hypokalemia.  7.  Personality disorder and depression.   PLAN:  The patient will be administered intravenous fluids.  We will give  oxygen as needed.  We will administer nebulizer treatments with albuterol  and Atrovent, continue Zithromax and Rocephin and observe her course.  It  may be a good idea to have Child psychotherapist evaluate her home situation to see  if anything is going to be needed there.           ______________________________  Sherin Quarry, MD     SY/MEDQ  D:  11/07/2005  T:  11/07/2005  Job:  [id number]   cc:   Dirk Dress. Katrinka Blazing, M.D.  Fax: [phone]

## 2010-10-24 ENCOUNTER — Encounter: Payer: Self-pay | Admitting: Internal Medicine

## 2010-10-25 ENCOUNTER — Ambulatory Visit: Payer: Self-pay | Admitting: Family

## 2010-11-01 ENCOUNTER — Encounter: Payer: Self-pay | Admitting: Internal Medicine

## 2010-11-04 ENCOUNTER — Telehealth: Payer: Self-pay | Admitting: Family

## 2010-11-04 ENCOUNTER — Ambulatory Visit (INDEPENDENT_AMBULATORY_CARE_PROVIDER_SITE_OTHER): Payer: Medicaid Other | Admitting: Family

## 2010-11-04 ENCOUNTER — Encounter: Payer: Self-pay | Admitting: Family

## 2010-11-04 VITALS — BP 90/70 | HR 100 | Temp 98.3°F | Resp 20 | Ht 62.01 in | Wt 91.1 lb

## 2010-11-04 DIAGNOSIS — F29 Unspecified psychosis not due to a substance or known physiological condition: Secondary | ICD-10-CM

## 2010-11-04 DIAGNOSIS — J449 Chronic obstructive pulmonary disease, unspecified: Secondary | ICD-10-CM

## 2010-11-04 DIAGNOSIS — F329 Major depressive disorder, single episode, unspecified: Secondary | ICD-10-CM

## 2010-11-04 DIAGNOSIS — R413 Other amnesia: Secondary | ICD-10-CM | POA: Insufficient documentation

## 2010-11-04 DIAGNOSIS — E538 Deficiency of other specified B group vitamins: Secondary | ICD-10-CM

## 2010-11-04 DIAGNOSIS — R410 Disorientation, unspecified: Secondary | ICD-10-CM

## 2010-11-04 LAB — VITAMIN B12: Vitamin B-12: 181 pg/mL — ABNORMAL LOW (ref 211–911)

## 2010-11-04 LAB — RPR: RPR Ser Ql: REACTIVE — AB

## 2010-11-04 MED ORDER — CITALOPRAM HYDROBROMIDE 20 MG PO TABS: 20.0000 mg | ORAL_TABLET | Freq: Every day | ORAL | Status: DC

## 2010-11-04 NOTE — Assessment & Plan Note (Addendum)
15 minutes spent with pt.  Greater than 50% of this time was spent counseling her on her depression and anxiety.  Rx was phoned directly to her pharmacy. Check baseline labs as below. Plan to treat anxiety/depression with citalopram. Consider MMSE next visit.

## 2010-11-04 NOTE — Patient Instructions (Signed)
Citalopram 20mg , (1/2 tablet once daily for one week, then increase to a full tablet once daily on week two. Call if you develop worsening depressive symptoms.  Go to ER if you develop suicidal thoughts.

## 2010-11-04 NOTE — Assessment & Plan Note (Signed)
Trial of citalopram.  Consider psych referral if no improvement in 1 month.

## 2010-11-04 NOTE — Progress Notes (Signed)
  Subjective:    Patient ID: Abigail Castillo, female    DOB: February 26, 1951, 60 y.o.   MRN: [id number]  HPI  Anxiety- feels that this is due to being generally home bound due to her COPD.  Daughter notes that she "gets confused." Not thinking clearly.  She sometimes thinks for example, that they "are not related." Gets confused forgets where she is going on the bus.    Depression- + sadness, some days she will "lay in bed all day on some days."  Denies suicidal or homocidal ideation.  Some nights she has insomnia.  She has a history of a personality disorder and lives in an assised living. Daughter reports that she has been treated with lexapro in the past which helped, but pt stopped as it was a "depression" medicine.   COPD- notes that she started smoking again.   Review of Systems see HPI    Past Medical History  Diagnosis Date  . Osteoporosis   . COPD (chronic obstructive pulmonary disease)   . Tobacco abuse     ongoing    History   Social History  . Marital Status: Divorced    Spouse Name: N/A    Number of Children: N/A  . Years of Education: N/A   Occupational History  . Not on file.   Social History Main Topics  . Smoking status: Current Everyday Smoker  . Smokeless tobacco: Not on file  . Alcohol Use: Not on file  . Drug Use: Not on file  . Sexually Active: Not on file   Other Topics Concern  . Not on file   Social History Narrative  . No narrative on file    Past Surgical History  Procedure Date  . Cholecystectomy   . Endometrial biopsy   . Breast surgery     lumpectomy    Family History  Problem Relation Age of Onset  . Coronary artery disease Mother   . Coronary artery disease Father   . Cancer Sister     breast  . Diabetes Other     Grandfather  . Cancer Other     niece--breast    Allergies  Allergen Reactions  . Sulfonamide Derivatives     Current Outpatient Prescriptions on File Prior to Visit  Medication Sig Dispense Refill  .  ALPRAZolam (XANAX) 0.25 MG tablet Take 0.25 mg by mouth daily as needed.        . budesonide-formoterol (SYMBICORT) 160-4.5 MCG/ACT inhaler Inhale 2 puffs into the lungs 2 (two) times daily.        Marland Kitchen ipratropium-albuterol (DUONEB) 0.5-2.5 (3) MG/3ML SOLN Take 3 mLs by nebulization 4 (four) times daily as needed.        . mirtazapine (REMERON) 15 MG tablet Take 15 mg by mouth at bedtime.          BP 90/70  Pulse 100  Temp(Src) 98.3 F (36.8 C) (Oral)  Resp 20  Ht 5' 2.01" (1.575 m)  Wt 91 lb 1.3 oz (41.314 kg)  BMI 16.65 kg/m2    Objective:   Physical Exam  Constitutional: She appears well-developed and well-nourished.  Cardiovascular: Normal rate and regular rhythm.   Pulmonary/Chest: Effort normal and breath sounds normal.  Psychiatric:       Speech is tangential, difficulty staying on subject.          Assessment & Plan:

## 2010-11-04 NOTE — Telephone Encounter (Signed)
Please call patient's daughter and ask her to bring her back in 1 week.  I would like to re-evaluate her mental status.

## 2010-11-05 NOTE — Telephone Encounter (Signed)
Left detailed message on Candy's voicemail to return my call and schedule f/u in 1 week to re-evaluate pt's mental status.

## 2010-11-11 ENCOUNTER — Telehealth: Payer: Self-pay | Admitting: Family

## 2010-11-11 DIAGNOSIS — E538 Deficiency of other specified B group vitamins: Secondary | ICD-10-CM | POA: Insufficient documentation

## 2010-11-11 NOTE — Telephone Encounter (Signed)
Please call patient and let her know that her B12 level is low.  I would like for her to start B12 injections in the office IM weekly for 4 doses, then once monthly.

## 2010-11-12 DIAGNOSIS — R410 Disorientation, unspecified: Secondary | ICD-10-CM | POA: Insufficient documentation

## 2010-11-12 NOTE — Assessment & Plan Note (Signed)
B12, folate, RPR checked-  B12 low, see above.  This may be contributing to her confusion and memory problems.  RPR was falsely elevated based on titer.  Case was discussed with Dr. Darrow Bussing.

## 2010-11-12 NOTE — Assessment & Plan Note (Signed)
Pt was counseled on importance of smoking cessation. She is maintained on duonebs.

## 2010-11-12 NOTE — Assessment & Plan Note (Signed)
Start B12 injections- see phone note. New diagnosis.  This is likely contributing to her memory issues.

## 2010-11-12 NOTE — Telephone Encounter (Signed)
Notified pt's daughter, Drue Flirt. Pt has appt on 11/17/09 and will start weekly B12 injections x 4 at that time. Candy states she will schedule remaining visits at that appt.

## 2010-11-18 ENCOUNTER — Encounter: Payer: Self-pay | Admitting: Family

## 2010-11-18 ENCOUNTER — Ambulatory Visit: Payer: Medicaid Other | Admitting: Family

## 2010-11-18 VITALS — BP 110/80 | HR 72 | Temp 97.8°F | Resp 16 | Ht 62.0 in | Wt 90.1 lb

## 2010-11-18 DIAGNOSIS — R410 Disorientation, unspecified: Secondary | ICD-10-CM

## 2010-11-18 DIAGNOSIS — F329 Major depressive disorder, single episode, unspecified: Secondary | ICD-10-CM

## 2010-11-18 DIAGNOSIS — F29 Unspecified psychosis not due to a substance or known physiological condition: Secondary | ICD-10-CM

## 2010-11-18 DIAGNOSIS — E538 Deficiency of other specified B group vitamins: Secondary | ICD-10-CM

## 2010-11-18 MED ORDER — CYANOCOBALAMIN 1000 MCG/ML IJ SOLN
1000.0000 ug | Freq: Once | INTRAMUSCULAR | Status: AC
  Administered 2010-11-18: 1000 ug via INTRAMUSCULAR

## 2010-11-18 NOTE — Progress Notes (Signed)
Subjective:    Patient ID: Abigail Castillo, female    DOB: Oct 28, 1950, 60 y.o.   MRN: [id number]  HPI  Ms.  Fetterly is a 60 yr old female who presents today with her daughter for follow up on her memory loss.  The daughter reports that the patient continues to have issues with confusion.   Daughter states that  She "gets really mixed up." Reports that its in the family history.  Thought that her daughter was dead.  Reports + hx of manic depression.  She has been seen by psychiatry in the past but only goes once every 5 years in order to secure her disability.  Depression- She reports that she has been sleeping better at night since starting citalopram.  Denies any adverse side effects.      Review of Systems    see history of present illness  Past Medical History  Diagnosis Date  . Osteoporosis   . COPD (chronic obstructive pulmonary disease)   . Tobacco abuse     ongoing  . DEPRESSION 05/31/2009    History   Social History  . Marital Status: Divorced    Spouse Name: N/A    Number of Children: N/A  . Years of Education: N/A   Occupational History  . Not on file.   Social History Main Topics  . Smoking status: Current Everyday Smoker  . Smokeless tobacco: Not on file  . Alcohol Use: Not on file  . Drug Use: Not on file  . Sexually Active: Not on file   Other Topics Concern  . Not on file   Social History Narrative  . No narrative on file    Past Surgical History  Procedure Date  . Cholecystectomy   . Endometrial biopsy   . Breast surgery     lumpectomy    Family History  Problem Relation Age of Onset  . Coronary artery disease Mother   . Coronary artery disease Father   . Cancer Sister     breast  . Diabetes Other     Grandfather  . Cancer Other     niece--breast    Allergies  Allergen Reactions  . Sulfonamide Derivatives     Current Outpatient Prescriptions on File Prior to Visit  Medication Sig Dispense Refill  . ALPRAZolam (XANAX) 0.25 MG  tablet Take 0.25 mg by mouth daily as needed.        . budesonide-formoterol (SYMBICORT) 160-4.5 MCG/ACT inhaler Inhale 2 puffs into the lungs 2 (two) times daily.        . citalopram (CELEXA) 20 MG tablet Take 1 tablet (20 mg total) by mouth daily.  30 tablet  1  . ipratropium-albuterol (DUONEB) 0.5-2.5 (3) MG/3ML SOLN Take 3 mLs by nebulization 4 (four) times daily as needed.        . mirtazapine (REMERON) 15 MG tablet Take 15 mg by mouth at bedtime.         No current facility-administered medications on file prior to visit.    BP 110/80  Pulse 72  Temp(Src) 97.8 F (36.6 C) (Oral)  Resp 16  Ht 5\' 2"  (1.575 m)  Wt 90 lb 1.3 oz (40.86 kg)  BMI 16.48 kg/m2    Objective:   Physical Exam  General: Awake, alert, elderly femal, appears older than stated age.  Psychiatric: Colm, pleasant, speech is tangential, difficulty staying on subject.        Assessment & Plan:  25 minutes spent with the patient stay, greater  than 50% of this time was spent counseling the patient on her depression

## 2010-11-18 NOTE — Assessment & Plan Note (Signed)
Clinically improved on citalopram, continue same and plan referral to psychiatry for further evaluation and treatment.

## 2010-11-18 NOTE — Patient Instructions (Signed)
Please follow up once weekly for 4 weeks for a B12 injection, then once monthly. You will be contacted about your referral to psychiatry. Follow up in 3 months.

## 2010-11-18 NOTE — Assessment & Plan Note (Signed)
Plan once weekly treatments for 4 weeks followed by once monthly treatments.

## 2010-11-18 NOTE — Assessment & Plan Note (Signed)
I suspect that the majority of her issue here is psychiatric in nature. She did complete a Mini-Mental status exam today and scored 20/30. This placed her in no cognitive impairment category. Her newly diagnosed vitamin B12 deficiency may be a contributing factor and she will be starting treatment today.

## 2010-12-16 ENCOUNTER — Ambulatory Visit: Payer: Medicaid Other

## 2010-12-17 ENCOUNTER — Ambulatory Visit (INDEPENDENT_AMBULATORY_CARE_PROVIDER_SITE_OTHER): Payer: Medicaid Other | Admitting: Internal Medicine

## 2010-12-17 DIAGNOSIS — Z111 Encounter for screening for respiratory tuberculosis: Secondary | ICD-10-CM

## 2010-12-17 NOTE — Progress Notes (Signed)
  Subjective:    Patient ID: Abigail Castillo, female    DOB: 18-Dec-1950, 60 y.o.   MRN: [id number]  HPI Patient presents today for ppd placement. Advised to return on Thursday 7/26 no later than Friday 7/27 for reading.    Review of Systems     Objective:   Physical Exam        Assessment & Plan:

## 2010-12-19 ENCOUNTER — Ambulatory Visit: Payer: Medicaid Other | Admitting: Internal Medicine

## 2010-12-19 LAB — TB SKIN TEST: TB Skin Test: NEGATIVE mm

## 2011-01-03 ENCOUNTER — Ambulatory Visit (INDEPENDENT_AMBULATORY_CARE_PROVIDER_SITE_OTHER): Payer: Medicaid Other | Admitting: Family

## 2011-01-03 DIAGNOSIS — E538 Deficiency of other specified B group vitamins: Secondary | ICD-10-CM

## 2011-01-03 MED ORDER — CYANOCOBALAMIN 1000 MCG/ML IJ SOLN
1000.0000 ug | Freq: Once | INTRAMUSCULAR | Status: AC
  Administered 2011-01-03: 1000 ug via INTRAMUSCULAR

## 2011-01-03 NOTE — Progress Notes (Signed)
  Subjective:    Patient ID: Abigail Castillo, female    DOB: 12-11-1950, 60 y.o.   MRN: [id number]  HPI    Review of Systems     Objective:   Physical Exam        Assessment & Plan:  Per Melissa [name],NP, pt is to continue B12 injections once a week for 2 weeks then receive monthly B12 injections.

## 2011-01-10 ENCOUNTER — Ambulatory Visit: Payer: Medicaid Other

## 2011-01-10 DIAGNOSIS — Z0289 Encounter for other administrative examinations: Secondary | ICD-10-CM

## 2011-01-17 ENCOUNTER — Ambulatory Visit (INDEPENDENT_AMBULATORY_CARE_PROVIDER_SITE_OTHER): Payer: Medicaid Other | Admitting: Family

## 2011-01-17 DIAGNOSIS — E538 Deficiency of other specified B group vitamins: Secondary | ICD-10-CM

## 2011-01-17 MED ORDER — CYANOCOBALAMIN 1000 MCG/ML IJ SOLN
1000.0000 ug | Freq: Once | INTRAMUSCULAR | Status: AC
  Administered 2011-01-17: 1000 ug via INTRAMUSCULAR

## 2011-01-24 ENCOUNTER — Ambulatory Visit (INDEPENDENT_AMBULATORY_CARE_PROVIDER_SITE_OTHER): Payer: Medicaid Other | Admitting: Family

## 2011-01-24 DIAGNOSIS — E538 Deficiency of other specified B group vitamins: Secondary | ICD-10-CM

## 2011-01-24 MED ORDER — CYANOCOBALAMIN 1000 MCG/ML IJ SOLN
1000.0000 ug | Freq: Once | INTRAMUSCULAR | Status: AC
Start: 2011-01-24 — End: 2011-01-24
  Administered 2011-01-24: 1000 ug via INTRAMUSCULAR

## 2011-02-14 ENCOUNTER — Ambulatory Visit: Payer: Medicaid Other

## 2011-02-21 ENCOUNTER — Ambulatory Visit: Payer: Medicaid Other | Admitting: Family

## 2011-02-21 ENCOUNTER — Ambulatory Visit: Payer: Medicaid Other

## 2011-02-21 DIAGNOSIS — Z0289 Encounter for other administrative examinations: Secondary | ICD-10-CM

## 2011-02-25 ENCOUNTER — Ambulatory Visit (INDEPENDENT_AMBULATORY_CARE_PROVIDER_SITE_OTHER): Payer: Medicaid Other | Admitting: Family

## 2011-02-25 DIAGNOSIS — E538 Deficiency of other specified B group vitamins: Secondary | ICD-10-CM

## 2011-02-25 MED ORDER — CYANOCOBALAMIN 1000 MCG/ML IJ SOLN: 1000.0000 ug | INTRAMUSCULAR | Status: DC

## 2011-02-25 MED ORDER — CYANOCOBALAMIN 1000 MCG/ML IJ SOLN
1000.0000 ug | INTRAMUSCULAR | Status: DC
  Administered 2011-02-25 – 2011-12-17 (×2): 1000 ug via INTRAMUSCULAR

## 2011-05-08 ENCOUNTER — Telehealth: Payer: Self-pay | Admitting: *Deleted

## 2011-05-08 NOTE — Telephone Encounter (Signed)
FL2 signed and faxed to [phone] Attn: Roena Malady.

## 2011-05-08 NOTE — Telephone Encounter (Signed)
Received fax from [facility] for signature of FL2. Spoke with Roena Malady at [facility] and was told that they have faxed form to [place] previously and they are at a deadline for completion to ensure continuation of Medicaid coverage. Advised her that yesterday's fax was the first one we received but would try to get it back to her today.

## 2011-08-26 DIAGNOSIS — F319 Bipolar disorder, unspecified: Secondary | ICD-10-CM | POA: Diagnosis not present

## 2011-08-26 DIAGNOSIS — F411 Generalized anxiety disorder: Secondary | ICD-10-CM | POA: Diagnosis not present

## 2011-09-04 DIAGNOSIS — Z79899 Other long term (current) drug therapy: Secondary | ICD-10-CM | POA: Diagnosis not present

## 2011-09-04 DIAGNOSIS — F319 Bipolar disorder, unspecified: Secondary | ICD-10-CM | POA: Diagnosis not present

## 2011-11-10 ENCOUNTER — Telehealth: Payer: Self-pay | Admitting: *Deleted

## 2011-11-10 NOTE — Telephone Encounter (Signed)
Received form from [employer] transportation application for services. Form completed and faxed to [phone].

## 2011-12-02 ENCOUNTER — Telehealth: Payer: Self-pay | Admitting: Family

## 2011-12-02 ENCOUNTER — Other Ambulatory Visit: Payer: Self-pay | Admitting: Family

## 2011-12-02 NOTE — Telephone Encounter (Signed)
Pls call daughter and let her know that the pt is due for a follow up appointment in the office.

## 2011-12-02 NOTE — Telephone Encounter (Signed)
Left message for patients daughter, Candy to return my phone call °

## 2011-12-02 NOTE — Telephone Encounter (Signed)
Received fax from [facility] wanting to know if pt is to continue monthly B12 injections. Faxed response per Provider that pt should continue monthly ( ) injections to [phone].

## 2011-12-03 NOTE — Telephone Encounter (Signed)
Left message for patients daughter, Drue Flirt to return my phone call

## 2011-12-04 ENCOUNTER — Telehealth: Payer: Self-pay | Admitting: Family

## 2011-12-04 MED ORDER — LOPERAMIDE HCL 2 MG PO CAPS: ORAL_CAPSULE | ORAL | Status: DC

## 2011-12-04 NOTE — Telephone Encounter (Signed)
Notified Talbert Forest and faxed note to # below. Rx sent to [facility].

## 2011-12-04 NOTE — Telephone Encounter (Signed)
Left message for patients daughter, Candy to return my phone call °

## 2011-12-04 NOTE — Telephone Encounter (Signed)
Please advise 

## 2011-12-04 NOTE — Telephone Encounter (Signed)
Imodium 2 caplets after the first loose stool; 1 caplet after each subsequent loose stool; but no more than 4 caplets in 24 hours. Followup if no improvement or worsening.

## 2011-12-04 NOTE — Telephone Encounter (Signed)
Talbert Forest from [facility] called stating that patient has had diarrhea since last night. She would like a prescription called in for that. F: [phone]

## 2011-12-16 ENCOUNTER — Other Ambulatory Visit: Payer: Self-pay | Admitting: Family

## 2011-12-16 NOTE — Telephone Encounter (Addendum)
Received fax from [place] at [facility] stating they never received PRN order for immodium or monthly b12 injections.  Per previous order from Sandford Craze, NP pt should continue monthly B12 injections. Refaxed order for both to [phone] and [phone].

## 2011-12-17 ENCOUNTER — Ambulatory Visit (INDEPENDENT_AMBULATORY_CARE_PROVIDER_SITE_OTHER): Payer: Medicare Other | Admitting: Family

## 2011-12-17 ENCOUNTER — Telehealth: Payer: Self-pay | Admitting: Family

## 2011-12-17 ENCOUNTER — Encounter: Payer: Self-pay | Admitting: Family

## 2011-12-17 VITALS — BP 116/80 | HR 76 | Temp 97.5°F | Resp 16 | Ht 62.0 in | Wt 119.1 lb

## 2011-12-17 DIAGNOSIS — E538 Deficiency of other specified B group vitamins: Secondary | ICD-10-CM | POA: Diagnosis not present

## 2011-12-17 DIAGNOSIS — J4489 Other specified chronic obstructive pulmonary disease: Secondary | ICD-10-CM

## 2011-12-17 DIAGNOSIS — N6320 Unspecified lump in the left breast, unspecified quadrant: Secondary | ICD-10-CM

## 2011-12-17 DIAGNOSIS — Z87898 Personal history of other specified conditions: Secondary | ICD-10-CM

## 2011-12-17 DIAGNOSIS — Z1211 Encounter for screening for malignant neoplasm of colon: Secondary | ICD-10-CM

## 2011-12-17 DIAGNOSIS — Z1231 Encounter for screening mammogram for malignant neoplasm of breast: Secondary | ICD-10-CM | POA: Diagnosis not present

## 2011-12-17 DIAGNOSIS — F3289 Other specified depressive episodes: Secondary | ICD-10-CM

## 2011-12-17 DIAGNOSIS — M810 Age-related osteoporosis without current pathological fracture: Secondary | ICD-10-CM

## 2011-12-17 DIAGNOSIS — F329 Major depressive disorder, single episode, unspecified: Secondary | ICD-10-CM

## 2011-12-17 DIAGNOSIS — J449 Chronic obstructive pulmonary disease, unspecified: Secondary | ICD-10-CM

## 2011-12-17 DIAGNOSIS — R413 Other amnesia: Secondary | ICD-10-CM

## 2011-12-17 DIAGNOSIS — Z1239 Encounter for other screening for malignant neoplasm of breast: Secondary | ICD-10-CM

## 2011-12-17 NOTE — Progress Notes (Signed)
Subjective:    Patient ID: Abigail Castillo, female    DOB: 07-12-1950, 61 y.o.   MRN: [id number]  HPI  Abigail Castillo is a 61 yr old female who presents today for follow up.  1) Depression-  Reports no depression or anxiety.  She is also on mirtazapine.   She is following with psychiatry.  [facility].    2) B12 Deficiency- For B12 injection today.  Per daughter the facility was not always following through with bringing her for her appointments.    3) COPD- Pt is no longer smoking.  Daughter notes that she has had some mildly runny nose.  Mild wheezing.  She is not using the symbicort.    4) osteoporosis- She has not had a recent dexa.  Agreeable to proceed.  Also due for mammo and has never had colo.   Daughter reports that memory has been better.     Review of Systems See HPI  Past Medical History  Diagnosis Date  . Osteoporosis   . COPD (chronic obstructive pulmonary disease)   . Tobacco abuse     ongoing  . DEPRESSION 05/31/2009    History   Social History  . Marital Status: Divorced    Spouse Name: N/A    Number of Children: N/A  . Years of Education: N/A   Occupational History  . Not on file.   Social History Main Topics  . Smoking status: Former Games developer  . Smokeless tobacco: Not on file  . Alcohol Use: Not on file  . Drug Use: Not on file  . Sexually Active: Not on file   Other Topics Concern  . Not on file   Social History Narrative  . No narrative on file    Past Surgical History  Procedure Date  . Cholecystectomy   . Endometrial biopsy   . Breast surgery     lumpectomy    Family History  Problem Relation Age of Onset  . Coronary artery disease Mother   . Coronary artery disease Father   . Cancer Sister     breast  . Diabetes Other     Grandfather  . Cancer Other     niece--breast    Allergies  Allergen Reactions  . Sulfonamide Derivatives     Current Outpatient Prescriptions on File Prior to Visit  Medication  Sig Dispense Refill  . citalopram (CELEXA) 20 MG tablet Take 1 tablet (20 mg total) by mouth daily.  30 tablet  1  . ALPRAZolam (XANAX) 0.25 MG tablet Take 0.25 mg by mouth daily as needed.        Marland Kitchen ipratropium-albuterol (DUONEB) 0.5-2.5 (3) MG/3ML SOLN Take 3 mLs by nebulization 4 (four) times daily as needed.        . loperamide (IMODIUM) 2 MG capsule Take 2 caplets after first loose stool, 1 caplet after each subsequent loose stool; but no more than 4 caplets in 24 hours.  30 capsule  0  . mirtazapine (REMERON) 15 MG tablet Take 15 mg by mouth at bedtime.         Current Facility-Administered Medications on File Prior to Visit  Medication Dose Route Frequency Provider Last Rate Last Dose  . cyanocobalamin ((VITAMIN B-12)) injection 1,000 mcg  1,000 mcg Intramuscular Q30 days Sandford Craze, NP   1,000 mcg at 12/17/11 1007    BP 116/80  Pulse 76  Temp 97.5 F (36.4 C) (Oral)  Resp 16  Ht 5\' 2"  (1.575 m)  Wt 119 lb 1.3 oz (54.014 kg)  BMI 21.78 kg/m2  SpO2 94%       Objective:   Physical Exam  Constitutional: She appears well-developed and well-nourished. No distress.  Cardiovascular: Normal rate and regular rhythm.   No murmur heard. Pulmonary/Chest: Effort normal and breath sounds normal. No respiratory distress. She has no wheezes. She has no rales. She exhibits no tenderness.  Musculoskeletal: She exhibits no edema.  Psychiatric: She has a normal mood and affect. Her behavior is normal. Thought content normal.          Assessment & Plan:

## 2011-12-17 NOTE — Assessment & Plan Note (Signed)
Clinically stable on current meds.  Follows with psychiatry.

## 2011-12-17 NOTE — Assessment & Plan Note (Signed)
Memory is improved.  Daughter thinks that the addition of remeron per psychiatry has helped.

## 2011-12-17 NOTE — Assessment & Plan Note (Signed)
She reports that she has not been using symbicort regularly.  She does use her duonebs.  Feels like this has been stable.

## 2011-12-17 NOTE — Telephone Encounter (Signed)
Erin from Imaging called stating that patient will need to have a Diagnostic mammogram done. Patient had a core biopsy done(left breast) in 2005 and was supposed to follow up six months after that and never went. Denny Peon stated that if diagnostic mammogram comes back normal then patient can start having mammogram done downstairs.  Please contact daughter Drue Flirt when scheduling diagnostic Mammogram.  W: [phone] C: [phone]

## 2011-12-17 NOTE — Patient Instructions (Addendum)
Please schedule mammogram on the first floor prior to leaving today. Schedule bone density test at the front desk. Complete your blood work prior to leaving.  You will be contact about your referral for your colonoscopy.  Please let us know if you have not heard back within 1 week about your referral. Please follow up in 1 month for b12 injection. Follow up in 6 months for a regular appointment.

## 2011-12-17 NOTE — Assessment & Plan Note (Signed)
B12 injection given today in office. Spoke with pt and daughter re: importance of monthly injections.  Daughter will try to bring her.

## 2011-12-19 ENCOUNTER — Ambulatory Visit (INDEPENDENT_AMBULATORY_CARE_PROVIDER_SITE_OTHER)
Admission: RE | Admit: 2011-12-19 | Discharge: 2011-12-19 | Disposition: A | Discharge status: Home / Self Care | Payer: Medicare Other | Source: Ambulatory Visit

## 2011-12-19 DIAGNOSIS — M810 Age-related osteoporosis without current pathological fracture: Secondary | ICD-10-CM | POA: Diagnosis not present

## 2011-12-19 NOTE — Telephone Encounter (Signed)
Abigail Castillo, please see daughter's contact info below for scheduling. Thanks.

## 2011-12-31 ENCOUNTER — Telehealth: Payer: Self-pay | Admitting: Family

## 2011-12-31 MED ORDER — ALENDRONATE SODIUM 70 MG PO TABS: 70.0000 mg | ORAL_TABLET | ORAL | Status: DC

## 2011-12-31 NOTE — Telephone Encounter (Signed)
Please call daughter and let her know that pt's bone density shows osteoporosis.  I would like for her to start fosamax once a week.  Take in AM on empty stomach and sit upright for 90 minutes after taking.  Also, when she returns next time for B12 injection, I would like to have a vitamin D level drawn (osteoporosis) please.  Add caltrate 600mg   BID and make sure pt is getting regular weight bearing exercise such as walking.

## 2012-01-01 NOTE — Telephone Encounter (Signed)
Notified pt and form faxed to [phone].

## 2012-01-01 NOTE — Telephone Encounter (Signed)
Previous documentation re: form entered in error. Left detailed message on Candy's (pt daughter) voicemail and notified Roena Malady at [facility] [phone] of instructions below. Fosamax rx has been sent electronically to [facility].  Printed order below and faxed to [phone] and [phone].

## 2012-01-13 ENCOUNTER — Other Ambulatory Visit: Payer: Self-pay | Admitting: *Deleted

## 2012-01-13 MED ORDER — ALENDRONATE SODIUM 70 MG PO TABS: 70.0000 mg | ORAL_TABLET | ORAL | Status: DC

## 2012-01-13 NOTE — Progress Notes (Signed)
Per [facility] request, Rx was sent to wrong pharmacy on 08.07.13; Done/SLS

## 2012-01-16 ENCOUNTER — Other Ambulatory Visit: Payer: Self-pay | Admitting: *Deleted

## 2012-01-16 ENCOUNTER — Ambulatory Visit (INDEPENDENT_AMBULATORY_CARE_PROVIDER_SITE_OTHER): Payer: Medicare Other | Admitting: *Deleted

## 2012-01-16 DIAGNOSIS — M810 Age-related osteoporosis without current pathological fracture: Secondary | ICD-10-CM | POA: Diagnosis not present

## 2012-01-16 DIAGNOSIS — D510 Vitamin B12 deficiency anemia due to intrinsic factor deficiency: Secondary | ICD-10-CM

## 2012-01-16 DIAGNOSIS — E538 Deficiency of other specified B group vitamins: Secondary | ICD-10-CM

## 2012-01-16 DIAGNOSIS — M1990 Unspecified osteoarthritis, unspecified site: Secondary | ICD-10-CM

## 2012-01-16 LAB — CBC
HCT: 39.7 % (ref 36.0–46.0)
MCH: 31 pg (ref 26.0–34.0)
MCV: 93.2 fL (ref 78.0–100.0)
RBC: 4.26 MIL/uL (ref 3.87–5.11)
WBC: 4.6 10*3/uL (ref 4.0–10.5)

## 2012-01-16 LAB — FOLATE: Folate: >20 ng/mL

## 2012-01-16 MED ORDER — CYANOCOBALAMIN 1000 MCG/ML IJ SOLN
1000.0000 ug | Freq: Once | INTRAMUSCULAR | Status: AC
  Administered 2012-01-16: 1000 ug via INTRAMUSCULAR

## 2012-01-16 NOTE — Progress Notes (Signed)
Per Vo given by Sandford Craze, lab orders placed & released/SLS

## 2012-01-19 ENCOUNTER — Telehealth: Payer: Self-pay | Admitting: Family

## 2012-01-19 MED ORDER — CHOLECALCIFEROL 25 MCG (1000 UT) PO CAPS: 3000.0000 [IU] | ORAL_CAPSULE | Freq: Every day | ORAL | Status: DC

## 2012-01-19 NOTE — Telephone Encounter (Signed)
Notified pt's daughter, Drue Flirt as well as Gweneth Dimitri at [facility] [phone] of instructions below. Printed order below and faxed to [phone] and [phone].

## 2012-01-19 NOTE — Telephone Encounter (Signed)
Pls call pt's daughter and let her know that vitamin D level is a little low.  She should start otc vitamin D 3000 units once daily.

## 2012-01-28 ENCOUNTER — Ambulatory Visit (INDEPENDENT_AMBULATORY_CARE_PROVIDER_SITE_OTHER): Payer: Medicare Other | Admitting: Gastroenterology

## 2012-01-28 ENCOUNTER — Encounter: Payer: Self-pay | Admitting: Gastroenterology

## 2012-01-28 VITALS — BP 100/74 | HR 76 | Ht 61.75 in | Wt 121.1 lb

## 2012-01-28 DIAGNOSIS — R197 Diarrhea, unspecified: Secondary | ICD-10-CM | POA: Diagnosis not present

## 2012-01-28 MED ORDER — NA SULFATE-K SULFATE-MG SULF 17.5-3.13-1.6 GM/177ML PO SOLN: 1.0000 | Freq: Once | ORAL | Status: DC

## 2012-01-28 NOTE — Progress Notes (Signed)
History of Present Illness: 61 year old white female referred at the request of Dr. [name] for evaluation of diarrhea and incontinence. For years she has suffered from intermittent diarrhea that appears to be related to the ingestion of certain foods such as fatty or greasy foods. With diarrhea she may have severe urgency with incontinence. There is no history of abdominal pain or rectal bleeding.    Past Medical History  Diagnosis Date  . Osteoporosis   . COPD (chronic obstructive pulmonary disease)   . Tobacco abuse     ongoing  . DEPRESSION 05/31/2009  . Chronic mental illness   . Vitamin d deficiency   . Vitamin B 12 deficiency    Past Surgical History  Procedure Date  . Cholecystectomy   . Breast surgery     lumpectomy, left  . Cervical cone biopsy    family history includes Breast cancer in her sister; Cancer in her other; Coronary artery disease in her father and mother; Diabetes in her father, mother, and sister; Hypertension in her father, mother, and sister; Mental illness in her sister; Other in her father; and Stroke in her father. Current Outpatient Prescriptions  Medication Sig Dispense Refill  . ALPRAZolam (XANAX) 0.25 MG tablet Take 0.25 mg by mouth daily as needed.        . Calcium Carbonate-Vitamin D (CALTRATE 600+D) 600-400 MG-UNIT per tablet Take 1 tablet by mouth 2 (two) times daily.      . Cholecalciferol 1000 UNITS capsule Take 400 Units by mouth daily.      Marland Kitchen ipratropium-albuterol (DUONEB) 0.5-2.5 (3) MG/3ML SOLN Take 3 mLs by nebulization 4 (four) times daily as needed.        . mirtazapine (REMERON) 15 MG tablet Take 15 mg by mouth at bedtime.        Marland Kitchen alendronate (FOSAMAX) 70 MG tablet Take 1 tablet (70 mg total) by mouth every 7 (seven) days. Take with a full glass of water on an empty stomach.  4 tablet  11   Current Facility-Administered Medications  Medication Dose Route Frequency Provider Last Rate Last Dose  . cyanocobalamin ((VITAMIN B-12))  injection 1,000 mcg  1,000 mcg Intramuscular Q30 days Sandford Craze, NP   1,000 mcg at 12/17/11 1007   Allergies as of 01/28/2012 - Review Complete 01/28/2012  Allergen Reaction Noted  . Sulfonamide derivatives      reports that she has quit smoking. Her smoking use included Cigarettes. She has never used smokeless tobacco. She reports that she does not drink alcohol or use illicit drugs.     Review of Systems: Pertinent positive and negative review of systems were noted in the above HPI section. All other review of systems were otherwise negative.  Vital signs were reviewed in today's medical record Physical Exam: General: Well developed , well nourished, no acute distress Head: Normocephalic and atraumatic Eyes:  sclerae anicteric, EOMI Ears: Normal auditory acuity Mouth: No deformity or lesions Neck: Supple, no masses or thyromegaly Lungs: Clear throughout to auscultation Heart: Regular rate and rhythm; no murmurs, rubs or bruits Abdomen: Soft, non tender and non distended. No masses, hepatosplenomegaly or hernias noted. Normal Bowel sounds Rectal:deferred Musculoskeletal: Symmetrical with no gross deformities  Skin: No lesions on visible extremities Pulses:  Normal pulses noted Extremities: No clubbing, cyanosis, edema or deformities noted Neurological: Alert oriented x 4, grossly nonfocal Cervical Nodes:  No significant cervical adenopathy Inguinal Nodes: No significant inguinal adenopathy Psychological:  Alert and cooperative. Normal mood and affect

## 2012-01-28 NOTE — Patient Instructions (Addendum)
Colonoscopy A colonoscopy is an exam to evaluate your entire colon. In this exam, your colon is cleansed. A long fiberoptic tube is inserted through your rectum and into your colon. The fiberoptic scope (endoscope) is a long bundle of enclosed and very flexible fibers. These fibers transmit light to the area examined and send images from that area to your caregiver. Discomfort is usually minimal. You may be given a drug to help you sleep (sedative) during or prior to the procedure. This exam helps to detect lumps (tumors), polyps, inflammation, and areas of bleeding. Your caregiver may also take a small piece of tissue (biopsy) that will be examined under a microscope. LET YOUR CAREGIVER KNOW ABOUT:   Allergies to food or medicine.   Medicines taken, including vitamins, herbs, eyedrops, over-the-counter medicines, and creams.   Use of steroids (by mouth or creams).   Previous problems with anesthetics or numbing medicines.   History of bleeding problems or blood clots.   Previous surgery.   Other health problems, including diabetes and kidney problems.   Possibility of pregnancy, if this applies.  BEFORE THE PROCEDURE   A clear liquid diet may be required for 2 days before the exam.   Ask your caregiver about changing or stopping your regular medications.   Liquid injections (enemas) or laxatives may be required.   A large amount of electrolyte solution may be given to you to drink over a short period of time. This solution is used to clean out your colon.   You should be present 60 minutes prior to your procedure or as directed by your caregiver.  AFTER THE PROCEDURE   If you received a sedative or pain relieving medication, you will need to arrange for someone to drive you home.   Occasionally, there is a little blood passed with the first bowel movement. Do not be concerned.  FINDING OUT THE RESULTS OF YOUR TEST Not all test results are available during your visit. If your test  results are not back during the visit, make an appointment with your caregiver to find out the results. Do not assume everything is normal if you have not heard from your caregiver or the medical facility. It is important for you to follow up on all of your test results. HOME CARE INSTRUCTIONS   It is not unusual to pass moderate amounts of gas and experience mild abdominal cramping following the procedure. This is due to air being used to inflate your colon during the exam. Walking or a warm pack on your belly (abdomen) may help.   You may resume all normal meals and activities after sedatives and medicines have worn off.   Only take over-the-counter or prescription medicines for pain, discomfort, or fever as directed by your caregiver. Do not use aspirin or blood thinners if a biopsy was taken. Consult your caregiver for medicine usage if biopsies were taken.  SEEK IMMEDIATE MEDICAL CARE IF:   You have a fever.   You pass large blood clots or fill a toilet with blood following the procedure. This may also occur 10 to 14 days following the procedure. This is more likely if a biopsy was taken.   You develop abdominal pain that keeps getting worse and cannot be relieved with medicine.  Document Released: 05/09/2000 Document Revised: 05/01/2011 Document Reviewed: 12/23/2007 [facility] Patient Information 2012 [place], [place].  Your Colonoscopy has been scheduled on 02/12/2012 at 4pm Separate instructions have been given

## 2012-01-28 NOTE — Assessment & Plan Note (Addendum)
Chronic intermittent diarrhea is likely related to IBS. In particular, it she seems to not digest certain foods. Incontinence is due to  severe urgency.  Recommendations #1 fiber supplementation #2 dietary discretion #3 colonoscopy

## 2012-01-30 ENCOUNTER — Telehealth: Payer: Self-pay | Admitting: Gastroenterology

## 2012-02-03 ENCOUNTER — Telehealth: Payer: Self-pay | Admitting: Gastroenterology

## 2012-02-03 NOTE — Telephone Encounter (Signed)
Left message for Charlynne Cousins to call back.  Rx called into pharmacy for Suprep.

## 2012-02-03 NOTE — Telephone Encounter (Signed)
Not needed

## 2012-02-04 ENCOUNTER — Telehealth: Payer: Self-pay | Admitting: *Deleted

## 2012-02-04 MED ORDER — ALENDRONATE SODIUM 70 MG PO TABS: 70.0000 mg | ORAL_TABLET | ORAL | Status: AC

## 2012-02-04 NOTE — Telephone Encounter (Signed)
Received call from Gweneth Dimitri at [facility] stating [facility] never received our Rx for Fosamax. She is also requesting order for patient's OTC calcium supplement.  Spoke with pharmacist and she reports they never received Fosamax from August and requests that we fax rx to [phone]. Rx printed and forwarded to Provider for signature.  Also please advise if you want pt to continue OTC Calcium + vit D 600-400mg  1 tablet twice a day.

## 2012-02-04 NOTE — Telephone Encounter (Signed)
Fosamax rx faxed to [facility] @ # number below. Rx and order faxed to [facility] @ [phone] [facility]).

## 2012-02-04 NOTE — Telephone Encounter (Signed)
Yes pt should continue calcium please. Rx signed.

## 2012-02-12 ENCOUNTER — Encounter: Payer: Self-pay | Admitting: Gastroenterology

## 2012-02-12 ENCOUNTER — Ambulatory Visit (AMBULATORY_SURGERY_CENTER): Payer: Medicare Other | Admitting: Gastroenterology

## 2012-02-12 VITALS — BP 107/71 | HR 81 | Temp 96.4°F | Resp 17 | Ht 61.0 in | Wt 121.0 lb

## 2012-02-12 DIAGNOSIS — R197 Diarrhea, unspecified: Secondary | ICD-10-CM

## 2012-02-12 DIAGNOSIS — K5289 Other specified noninfective gastroenteritis and colitis: Secondary | ICD-10-CM

## 2012-02-12 DIAGNOSIS — D126 Benign neoplasm of colon, unspecified: Secondary | ICD-10-CM | POA: Diagnosis not present

## 2012-02-12 DIAGNOSIS — Z1211 Encounter for screening for malignant neoplasm of colon: Secondary | ICD-10-CM | POA: Diagnosis not present

## 2012-02-12 DIAGNOSIS — J449 Chronic obstructive pulmonary disease, unspecified: Secondary | ICD-10-CM | POA: Diagnosis not present

## 2012-02-12 DIAGNOSIS — F411 Generalized anxiety disorder: Secondary | ICD-10-CM | POA: Diagnosis not present

## 2012-02-12 MED ORDER — SODIUM CHLORIDE 0.9 % IV SOLN: 500.0000 mL | INTRAVENOUS | Status: DC

## 2012-02-12 NOTE — Patient Instructions (Addendum)
YOU HAD AN ENDOSCOPIC PROCEDURE TODAY AT THE [facility]: Refer to the procedure report that was given to you for any specific questions about what was found during the examination.  If the procedure report does not answer your questions, please call your gastroenterologist to clarify.  If you requested that your care partner not be given the details of your procedure findings, then the procedure report has been included in a sealed envelope for you to review at your convenience later.  YOU SHOULD EXPECT: Some feelings of bloating in the abdomen. Passage of more gas than usual.  Walking can help get rid of the air that was put into your GI tract during the procedure and reduce the bloating. If you had a lower endoscopy (such as a colonoscopy or flexible sigmoidoscopy) you may notice spotting of blood in your stool or on the toilet paper. If you underwent a bowel prep for your procedure, then you may not have a normal bowel movement for a few days.  DIET: Your first meal following the procedure should be a light meal and then it is ok to progress to your normal diet.  A half-sandwich or bowl of soup is an example of a good first meal.  Heavy or fried foods are harder to digest and may make you feel nauseous or bloated.  Likewise meals heavy in dairy and vegetables can cause extra gas to form and this can also increase the bloating.  Drink plenty of fluids but you should avoid alcoholic beverages for 24 hours.  ACTIVITY: Your care partner should take you home directly after the procedure.  You should plan to take it easy, moving slowly for the rest of the day.  You can resume normal activity the day after the procedure however you should NOT DRIVE or use heavy machinery for 24 hours (because of the sedation medicines used during the test).    SYMPTOMS TO REPORT IMMEDIATELY: A gastroenterologist can be reached at any hour.  During normal business hours, 8:30 AM to 5:00 PM Monday through Friday,  call [phone].  After hours and on weekends, please call the GI answering service at [phone] who will take a message and have the physician on call contact you.   Following lower endoscopy (colonoscopy or flexible sigmoidoscopy):  Excessive amounts of blood in the stool  Significant tenderness or worsening of abdominal pains  Swelling of the abdomen that is new, acute  Fever of 100F or higher  Following upper endoscopy (EGD)  Vomiting of blood or coffee ground material  New chest pain or pain under the shoulder blades  Painful or persistently difficult swallowing  New shortness of breath  Fever of 100F or higher  Black, tarry-looking stools  FOLLOW UP: If any biopsies were taken you will be contacted by phone or by letter within the next 1-3 weeks.  Call your gastroenterologist if you have not heard about the biopsies in 3 weeks.  Our staff will call the home number listed on your records the next business day following your procedure to check on you and address any questions or concerns that you may have at that time regarding the information given to you following your procedure. This is a courtesy call and so if there is no answer at the home number and we have not heard from you through the emergency physician on call, we will assume that you have returned to your regular daily activities without incident.  SIGNATURES/CONFIDENTIALITY: You and/or your care   partner have signed paperwork which will be entered into your electronic medical record.  These signatures attest to the fact that that the information above on your After Visit Summary has been reviewed and is understood.  Full responsibility of the confidentiality of this discharge information lies with you and/or your care-partner.  

## 2012-02-12 NOTE — Progress Notes (Signed)
Patient did not experience any of the following events: a burn prior to discharge; a fall within the facility; wrong site/side/patient/procedure/implant event; or a hospital transfer or hospital admission upon discharge from the facility. (G8907) Patient did not have preoperative order for IV antibiotic SSI prophylaxis. (G8918)  

## 2012-02-12 NOTE — Op Note (Signed)
[facility] [address] [place], [address]   COLONOSCOPY PROCEDURE REPORT  PATIENT: Abigail Castillo, Abigail Castillo  MR#: [id number] BIRTHDATE: 11-04-1950 , 60  yrs. old GENDER: Female ENDOSCOPIST: Louis Meckel, MD REFERRED [id number] [name], FNP PROCEDURE DATE:  02/12/2012 PROCEDURE:   Colonoscopy with snare polypectomy, Colonoscopy with cold biopsy polypectomy, and Colonoscopy with biopsy ASA CLASS:   Class II INDICATIONS: MEDICATIONS: MAC sedation, administered by CRNA and propofol (Diprivan) 400mg  IV  DESCRIPTION OF PROCEDURE:   After the risks benefits and alternatives of the procedure were thoroughly explained, informed consent was obtained.  A digital rectal exam revealed no abnormalities of the rectum.   The LB [id number]  endoscope was introduced through the anus and advanced to the cecum, which was identified by both the appendix and ileocecal valve. No adverse events experienced.   The quality of the prep was Suprep excellent The instrument was then slowly withdrawn as the colon was fully examined.      COLON FINDINGS: A flat polyp was found measuring 1mm was seen at the cecum.  A polypectomy was performed with cold forceps.  The resection was complete and the polyp tissue was completely retrieved.   A sessile polyp measuring 10 mm in size was found in the ascending colon.  A polypectomy was performed with a cold snare.  The resection was complete and the polyp tissue was completely retrieved.   A sessile polyp measuring 12 mm in size was found in the descending colon.  A polypectomy was performed using snare cautery.  The resection was complete and the polyp tissue was completely retrieved.   Three sessile polyps were found in the sigmoid colon between 10 and 15cm, ranging from 10-66mm.  The 2 largest polyps were removed with snare cautery.  Smallest polyp was removed with cold polypectomy snare.  Specimens were submitted to pathology.      Mild mucosal edema and erythema confined to the rectal vault.  Biopsies were taken.   Mild mucosal edema and erythema confined to the rectal vault.  Biopsies were taken.   The colon mucosa was otherwise normal. Random biopsies were taken to r/o microscopic colitis.  Retroflexed views revealed no abnormalities. The time to cecum=6 minutes 15 seconds.  Withdrawal time=16 minutes 42 seconds.  The scope was withdrawn and the procedure completed. COMPLICATIONS: There were no complications.  ENDOSCOPIC IMPRESSION: 1.  colonic polyposis 2.  qustionable proctitis    RECOMMENDATIONS: 1.  await biopsy results 2.  If the polyp(s) removed today are proven to be adenomatous (pre-cancerous) polyps, you will need a colonoscopy in 3 years. Otherwise you should continue to follow colorectal cancer screening guidelines for "routine risk" patients with a colonoscopy in 10 years.  You will receive a letter within 1-2 weeks with the results of your biopsy as well as final recommendations.  Please call my office if you have not received a letter after 3 weeks.   eSigned:  Louis Meckel, MD 02/12/2012 4:12 PM   cc:   PATIENT NAME:  Abigail Castillo, Abigail Castillo MR#: [id number]

## 2012-02-12 NOTE — Progress Notes (Signed)
pts daughter helped her answer her medical hx questions

## 2012-02-13 ENCOUNTER — Encounter: Payer: Self-pay | Admitting: Family

## 2012-02-13 ENCOUNTER — Ambulatory Visit
Admission: RE | Admit: 2012-02-13 | Discharge: 2012-02-13 | Disposition: A | Discharge status: Home / Self Care | Payer: Medicare Other | Source: Ambulatory Visit | Attending: Family | Admitting: Family

## 2012-02-13 ENCOUNTER — Telehealth: Payer: Self-pay | Admitting: *Deleted

## 2012-02-13 DIAGNOSIS — N6320 Unspecified lump in the left breast, unspecified quadrant: Secondary | ICD-10-CM

## 2012-02-13 DIAGNOSIS — R928 Other abnormal and inconclusive findings on diagnostic imaging of breast: Secondary | ICD-10-CM | POA: Diagnosis not present

## 2012-02-13 NOTE — Telephone Encounter (Signed)
  Follow up Call-  Call back number 02/12/2012  Post procedure Call Back phone  # [phone] daughter's cell  Permission to leave phone message Yes     Patient questions:  Do you have a fever, pain , or abdominal swelling? no Pain Score  0 *  Have you tolerated food without any problems? yes  Have you been able to return to your normal activities? yes  Do you have any questions about your discharge instructions: Diet   no Medications  no Follow up visit  no  Do you have questions or concerns about your Care? no  Actions: * If pain score is 4 or above: No action needed, pain <4.

## 2012-02-17 ENCOUNTER — Ambulatory Visit (INDEPENDENT_AMBULATORY_CARE_PROVIDER_SITE_OTHER): Payer: Medicare Other | Admitting: Family

## 2012-02-17 DIAGNOSIS — E538 Deficiency of other specified B group vitamins: Secondary | ICD-10-CM

## 2012-02-17 MED ORDER — CYANOCOBALAMIN 1000 MCG/ML IJ SOLN
1000.0000 ug | INTRAMUSCULAR | Status: DC
  Administered 2012-02-17: 1000 ug via INTRAMUSCULAR

## 2012-02-19 ENCOUNTER — Telehealth: Payer: Self-pay | Admitting: *Deleted

## 2012-02-19 NOTE — Telephone Encounter (Signed)
Per verbal from Provider, ok to try Mucinex 600mg  twice a day. If symptoms worsen or do not improve pt should schedule appointment for further evaluation. Order faxed to [phone].

## 2012-02-19 NOTE — Telephone Encounter (Signed)
Received call from [place] at [facility] stating pt currently has "dry heavy cough"; sounds like pt needs to expectorate. States they have not heard any wheezing."  No fever or head congestion. Pt does have COPD. They are wanting to know if they can get an order for something OTC to try for her cough first.  Please advise.  Fax # [phone] or [phone].

## 2012-02-20 ENCOUNTER — Encounter: Payer: Self-pay | Admitting: Gastroenterology

## 2012-02-23 ENCOUNTER — Telehealth: Payer: Self-pay | Admitting: *Deleted

## 2012-02-23 NOTE — Telephone Encounter (Signed)
Received call from [place] at [facility] stating they have documentation from [place] that indicated pt needed to return to the lab on 12/17/11 for bloodwork and they want to know if that was completed. Per EPIC documentation, pt was seen on 12/17/11 and had bloodwork the same day. I do not see pending orders in system at this time. Gweneth Dimitri, administrator of [facility] is requesting lab from 12/17/11 be faxed to [phone].  Result printed and faxed.

## 2012-03-02 DIAGNOSIS — F411 Generalized anxiety disorder: Secondary | ICD-10-CM | POA: Diagnosis not present

## 2012-03-02 DIAGNOSIS — F319 Bipolar disorder, unspecified: Secondary | ICD-10-CM | POA: Diagnosis not present

## 2012-03-02 DIAGNOSIS — Z87891 Personal history of nicotine dependence: Secondary | ICD-10-CM | POA: Diagnosis not present

## 2012-03-10 ENCOUNTER — Encounter: Payer: Self-pay | Admitting: Gastroenterology

## 2012-03-10 ENCOUNTER — Ambulatory Visit (INDEPENDENT_AMBULATORY_CARE_PROVIDER_SITE_OTHER): Payer: Medicare Other | Admitting: Gastroenterology

## 2012-03-10 VITALS — BP 120/60 | HR 88 | Ht 61.0 in | Wt 122.0 lb

## 2012-03-10 DIAGNOSIS — Z860100 Personal history of colon polyps, unspecified: Secondary | ICD-10-CM | POA: Insufficient documentation

## 2012-03-10 DIAGNOSIS — R197 Diarrhea, unspecified: Secondary | ICD-10-CM

## 2012-03-10 NOTE — Assessment & Plan Note (Signed)
Resolved with dietary discretion.

## 2012-03-10 NOTE — Patient Instructions (Signed)
Please follow up as needed 

## 2012-03-10 NOTE — Progress Notes (Signed)
History of Present Illness:  [name] returns following colonoscopy. Random biopsies were negative. Multiple adenomatous polyps were removed. With dietary discretion diarrhea has entirely subsided. She has no GI complaints.    Review of Systems: Pertinent positive and negative review of systems were noted in the above HPI section. All other review of systems were otherwise negative.    Current Medications, Allergies, Past Medical History, Past Surgical History, Family History and Social History were reviewed in [employer] electronic medical record  Vital signs were reviewed in today's medical record. Physical Exam: General: Well developed , well nourished, no acute distress

## 2012-03-10 NOTE — Assessment & Plan Note (Signed)
Plan followup colonoscopy 2018 

## 2012-03-18 ENCOUNTER — Ambulatory Visit (INDEPENDENT_AMBULATORY_CARE_PROVIDER_SITE_OTHER): Payer: Medicare Other | Admitting: *Deleted

## 2012-03-18 DIAGNOSIS — E538 Deficiency of other specified B group vitamins: Secondary | ICD-10-CM

## 2012-03-18 MED ORDER — CYANOCOBALAMIN 1000 MCG/ML IJ SOLN
1000.0000 ug | Freq: Once | INTRAMUSCULAR | Status: AC
  Administered 2012-03-18: 1000 ug via INTRAMUSCULAR

## 2012-04-02 ENCOUNTER — Telehealth: Payer: Self-pay | Admitting: *Deleted

## 2012-04-02 NOTE — Telephone Encounter (Signed)
Received fax from [facility] stating pt's daughter purchased a calcium supplement 500mg  +D 700iu in Gummy form. Facility is requesting order to document change. Order signed and faxed to [phone].

## 2012-04-05 ENCOUNTER — Other Ambulatory Visit: Payer: Self-pay | Admitting: Internal Medicine

## 2012-04-19 ENCOUNTER — Ambulatory Visit (INDEPENDENT_AMBULATORY_CARE_PROVIDER_SITE_OTHER): Payer: Medicare Other | Admitting: Family

## 2012-04-19 DIAGNOSIS — E538 Deficiency of other specified B group vitamins: Secondary | ICD-10-CM

## 2012-04-19 MED ORDER — CYANOCOBALAMIN 1000 MCG/ML IJ SOLN
1000.0000 ug | Freq: Once | INTRAMUSCULAR | Status: AC
  Administered 2012-04-19: 1000 ug via INTRAMUSCULAR

## 2012-06-18 ENCOUNTER — Encounter: Payer: Self-pay | Admitting: Family

## 2012-06-18 ENCOUNTER — Ambulatory Visit (INDEPENDENT_AMBULATORY_CARE_PROVIDER_SITE_OTHER): Payer: Medicare Other | Admitting: Family

## 2012-06-18 VITALS — BP 106/78 | HR 70 | Temp 97.8°F | Resp 16 | Wt 126.1 lb

## 2012-06-18 DIAGNOSIS — M810 Age-related osteoporosis without current pathological fracture: Secondary | ICD-10-CM | POA: Diagnosis not present

## 2012-06-18 DIAGNOSIS — F329 Major depressive disorder, single episode, unspecified: Secondary | ICD-10-CM

## 2012-06-18 DIAGNOSIS — J449 Chronic obstructive pulmonary disease, unspecified: Secondary | ICD-10-CM

## 2012-06-18 DIAGNOSIS — E538 Deficiency of other specified B group vitamins: Secondary | ICD-10-CM | POA: Diagnosis not present

## 2012-06-18 DIAGNOSIS — Z23 Encounter for immunization: Secondary | ICD-10-CM

## 2012-06-18 LAB — CBC WITH DIFFERENTIAL/PLATELET
Basophils Absolute: 0 10*3/uL (ref 0.0–0.1)
Eosinophils Absolute: 0.1 10*3/uL (ref 0.0–0.7)
Eosinophils Relative: 2 % (ref 0–5)
MCH: 30.2 pg (ref 26.0–34.0)
MCHC: 33.4 g/dL (ref 30.0–36.0)
MCV: 90.2 fL (ref 78.0–100.0)
Platelets: 196 10*3/uL (ref 150–400)
RDW: 13.4 % (ref 11.5–15.5)

## 2012-06-18 MED ORDER — CYANOCOBALAMIN 1000 MCG/ML IJ SOLN
1000.0000 ug | Freq: Once | INTRAMUSCULAR | Status: AC
  Administered 2012-06-18: 1000 ug via INTRAMUSCULAR

## 2012-06-18 NOTE — Patient Instructions (Addendum)
Please follow up in 1 month for a b12 injection. Follow up in 6 months for routine office visit.

## 2012-06-18 NOTE — Progress Notes (Signed)
Subjective:    Patient ID: Abigail Castillo, female    DOB: 09/08/1950, 62 y.o.   MRN: [id number]  HPI   Ms.  Abigail Castillo is a 62 yr old female who presents today for follow up. She has not had flu shot but is agreeable to receive it.    Depression-  Currently maintained on citalopram and xanax. Reports that this is well controlld.    B12 deficiency- has not had b12 injection since November.    COPD- She uses duonebs prn. Reports that her breathing has been well controled.   Osteoporosis- on fosamax and caltrate.   Review of Systems See HPI  Past Medical History  Diagnosis Date  . Osteoporosis   . COPD (chronic obstructive pulmonary disease)   . Tobacco abuse     ongoing  . DEPRESSION 05/31/2009  . Chronic mental illness   . Vitamin D deficiency   . Vitamin B 12 deficiency   . Anxiety   . Emphysema of lung   . Blood transfusion     History   Social History  . Marital Status: Divorced    Spouse Name: N/A    Number of Children: 3  . Years of Education: N/A   Occupational History  . disabled    Social History Main Topics  . Smoking status: Former Smoker    Types: Cigarettes    Quit date: 08/12/2011  . Smokeless tobacco: Never Used  . Alcohol Use: No  . Drug Use: No  . Sexually Active: Not on file   Other Topics Concern  . Not on file   Social History Narrative  . No narrative on file    Past Surgical History  Procedure Date  . Cholecystectomy   . Breast surgery     lumpectomy, left  . Cervical cone biopsy     Family History  Problem Relation Age of Onset  . Coronary artery disease Mother   . Diabetes Mother   . Hypertension Mother   . Coronary artery disease Father   . Diabetes Father     Grandfather  . Hypertension Father   . Stroke Father   . Other Father     colostomy bag  . Breast cancer Sister   . Cancer Other     niece--breast  . Diabetes Sister   . Hypertension Sister   . Mental illness Sister     x 4  . Esophageal cancer Neg Hx     . Stomach cancer Neg Hx   . Rectal cancer Neg Hx     Allergies  Allergen Reactions  . Sulfonamide Derivatives Hives    Current Outpatient Prescriptions on File Prior to Visit  Medication Sig Dispense Refill  . alendronate (FOSAMAX) 70 MG tablet Take 1 tablet (70 mg total) by mouth every 7 (seven) days. Take with a full glass of water on an empty stomach.  4 tablet  11  . ALPRAZolam (XANAX) 0.25 MG tablet Take 0.25 mg by mouth daily as needed.        . Calcium Carbonate-Vitamin D (CALTRATE 600+D) 600-400 MG-UNIT per tablet Take 1 tablet by mouth 2 (two) times daily.      . Cholecalciferol 1000 UNITS capsule Take 400 Units by mouth daily.      . citalopram (CELEXA) 20 MG tablet Take 20 mg by mouth daily.       Marland Kitchen ipratropium-albuterol (DUONEB) 0.5-2.5 (3) MG/3ML SOLN Take 3 mLs by nebulization 4 (four) times daily as needed.        Marland Kitchen  mirtazapine (REMERON) 15 MG tablet Take 15 mg by mouth at bedtime.          BP 106/78  Pulse 70  Temp 97.8 F (36.6 C) (Oral)  Resp 16  Wt 126 lb 1.3 oz (57.19 kg)  SpO2 95%       Objective:   Physical Exam  Constitutional: She is oriented to person, place, and time. She appears well-developed and well-nourished. No distress.  HENT:  Head: Normocephalic and atraumatic.  Cardiovascular: Normal rate and regular rhythm.   No murmur heard. Pulmonary/Chest: Effort normal and breath sounds normal. No respiratory distress. She has no wheezes. She has no rales. She exhibits no tenderness.  Neurological: She is alert and oriented to person, place, and time.  Psychiatric: She has a normal mood and affect. Her behavior is normal. Judgment and thought content normal.          Assessment & Plan:

## 2012-06-18 NOTE — Assessment & Plan Note (Signed)
Last b12 injection was in November.  Will obtain b12 and CBC today to re-evaluate.

## 2012-06-18 NOTE — Assessment & Plan Note (Signed)
Continue fosamax and caltrate.

## 2012-06-18 NOTE — Assessment & Plan Note (Signed)
Well controlled. Continue remeron and citalopram.

## 2012-06-18 NOTE — Assessment & Plan Note (Signed)
Clinically stable on prn duonebs.

## 2012-06-20 ENCOUNTER — Encounter: Payer: Self-pay | Admitting: Family

## 2012-07-20 ENCOUNTER — Ambulatory Visit: Payer: Medicare Other

## 2012-07-29 ENCOUNTER — Telehealth: Payer: Self-pay | Admitting: Family

## 2012-07-29 NOTE — Telephone Encounter (Signed)
If you can get her in here in the next couple of days we should be ok.

## 2012-07-29 NOTE — Telephone Encounter (Signed)
Patient is requesting a b12 inj. She says that she is past due.

## 2012-08-03 NOTE — Telephone Encounter (Signed)
Left message for patient to return my call.

## 2012-08-09 ENCOUNTER — Ambulatory Visit: Payer: Medicare Other

## 2012-08-12 ENCOUNTER — Ambulatory Visit (INDEPENDENT_AMBULATORY_CARE_PROVIDER_SITE_OTHER): Payer: Medicare Other | Admitting: Family

## 2012-08-12 DIAGNOSIS — E538 Deficiency of other specified B group vitamins: Secondary | ICD-10-CM

## 2012-08-12 MED ORDER — CYANOCOBALAMIN 1000 MCG/ML IJ SOLN
1000.0000 ug | Freq: Once | INTRAMUSCULAR | Status: AC
  Administered 2012-08-12: 1000 ug via INTRAMUSCULAR

## 2012-08-24 DIAGNOSIS — F411 Generalized anxiety disorder: Secondary | ICD-10-CM | POA: Diagnosis not present

## 2012-08-24 DIAGNOSIS — F319 Bipolar disorder, unspecified: Secondary | ICD-10-CM | POA: Diagnosis not present

## 2012-09-17 ENCOUNTER — Ambulatory Visit (INDEPENDENT_AMBULATORY_CARE_PROVIDER_SITE_OTHER): Payer: Medicare Other | Admitting: Family

## 2012-09-17 DIAGNOSIS — E538 Deficiency of other specified B group vitamins: Secondary | ICD-10-CM

## 2012-09-17 MED ORDER — CYANOCOBALAMIN 1000 MCG/ML IJ SOLN
1000.0000 ug | Freq: Once | INTRAMUSCULAR | Status: AC
  Administered 2012-09-17: 1000 ug via INTRAMUSCULAR

## 2012-10-13 ENCOUNTER — Telehealth: Payer: Self-pay | Admitting: Family

## 2012-10-13 NOTE — Telephone Encounter (Signed)
Patients wants another B12. Do we have anymore in stock? Thanks!

## 2012-10-13 NOTE — Telephone Encounter (Signed)
Left detailed message on Candy's voicemail informing her that we do have b12 in stock and to schedule appointment anytime after 5/25

## 2012-10-13 NOTE — Telephone Encounter (Signed)
Yes, we have more in stock now. You can arrange B12 anytime on 5/25 or after.

## 2012-10-22 ENCOUNTER — Ambulatory Visit: Payer: Medicare Other

## 2012-10-26 ENCOUNTER — Ambulatory Visit (INDEPENDENT_AMBULATORY_CARE_PROVIDER_SITE_OTHER): Payer: Medicare Other | Admitting: Family

## 2012-10-26 DIAGNOSIS — E538 Deficiency of other specified B group vitamins: Secondary | ICD-10-CM

## 2012-10-26 MED ORDER — CYANOCOBALAMIN 1000 MCG/ML IJ SOLN
1000.0000 ug | Freq: Once | INTRAMUSCULAR | Status: AC
  Administered 2012-10-26: 1000 ug via INTRAMUSCULAR

## 2012-12-07 ENCOUNTER — Ambulatory Visit (INDEPENDENT_AMBULATORY_CARE_PROVIDER_SITE_OTHER): Payer: Medicare Other | Admitting: Family

## 2012-12-07 DIAGNOSIS — E538 Deficiency of other specified B group vitamins: Secondary | ICD-10-CM | POA: Diagnosis not present

## 2012-12-07 MED ORDER — CYANOCOBALAMIN 1000 MCG/ML IJ SOLN
1000.0000 ug | Freq: Once | INTRAMUSCULAR | Status: AC
  Administered 2012-12-07: 1000 ug via INTRAMUSCULAR

## 2012-12-22 ENCOUNTER — Ambulatory Visit: Payer: Medicare Other | Admitting: Family

## 2013-01-12 ENCOUNTER — Ambulatory Visit (INDEPENDENT_AMBULATORY_CARE_PROVIDER_SITE_OTHER): Payer: Medicare Other | Admitting: *Deleted

## 2013-01-12 DIAGNOSIS — E538 Deficiency of other specified B group vitamins: Secondary | ICD-10-CM

## 2013-01-12 MED ORDER — CYANOCOBALAMIN 1000 MCG/ML IJ SOLN
1000.0000 ug | Freq: Once | INTRAMUSCULAR | Status: AC
  Administered 2013-01-12: 1000 ug via INTRAMUSCULAR

## 2013-01-12 NOTE — Progress Notes (Signed)
  Subjective:    Patient ID: Abigail Castillo, female    DOB: 02-11-1951, 62 y.o.   MRN: [id number]  HPI    Review of Systems     Objective:   Physical Exam        Assessment & Plan:  After obtaining consent, and per orders of Sandford Craze, NP Vitamin B12, injection of 1000 mcg/1 mL given by SHARON SCATES. Patient tolerated injection to Left Deltoid/SLS.

## 2013-01-27 ENCOUNTER — Other Ambulatory Visit: Payer: Self-pay | Admitting: *Deleted

## 2013-01-27 NOTE — Telephone Encounter (Signed)
eScribe request for refill on Citalopram Last filled - 06.11.2012 in EMR, #30x1[?]; shows a Historical only listing 10.14.2013 by GI office Last AEX - 01.24.14 [comes in only for B12 injections] Next AEX - 6 Months [cancel and/or no show all appts], no future appt scheduled Please Advise/SLS

## 2013-01-28 NOTE — Telephone Encounter (Signed)
OK to send 2 week supply only- she is due for office visit.

## 2013-01-31 MED ORDER — CITALOPRAM HYDROBROMIDE 20 MG PO TABS: 20.0000 mg | ORAL_TABLET | Freq: Every day | ORAL | Status: DC

## 2013-01-31 NOTE — Telephone Encounter (Signed)
2 week supply sent to pharmacy. Please call pt or facility where pt is staying and ask them to arrange appt for pt to be seen before 2weeks is up as she will need to be seen before we can give additional refills.

## 2013-01-31 NOTE — Telephone Encounter (Signed)
Abigail Castillo scheduled appointment for tomorrow morning for patient

## 2013-01-31 NOTE — Telephone Encounter (Signed)
Left detailed message on Abigail Castillo's voicemail stating that a two week supply has been sent into the pharmacy and that she needs to call to schedule an appointment.

## 2013-02-01 ENCOUNTER — Encounter: Payer: Self-pay | Admitting: Family

## 2013-02-01 ENCOUNTER — Ambulatory Visit (INDEPENDENT_AMBULATORY_CARE_PROVIDER_SITE_OTHER): Payer: Medicare Other | Admitting: Family

## 2013-02-01 VITALS — BP 113/65 | HR 71 | Temp 98.4°F | Resp 16 | Wt 126.0 lb

## 2013-02-01 DIAGNOSIS — R635 Abnormal weight gain: Secondary | ICD-10-CM | POA: Diagnosis not present

## 2013-02-01 DIAGNOSIS — Z23 Encounter for immunization: Secondary | ICD-10-CM | POA: Diagnosis not present

## 2013-02-01 DIAGNOSIS — F329 Major depressive disorder, single episode, unspecified: Secondary | ICD-10-CM

## 2013-02-01 DIAGNOSIS — E538 Deficiency of other specified B group vitamins: Secondary | ICD-10-CM

## 2013-02-01 DIAGNOSIS — J449 Chronic obstructive pulmonary disease, unspecified: Secondary | ICD-10-CM | POA: Diagnosis not present

## 2013-02-01 DIAGNOSIS — M810 Age-related osteoporosis without current pathological fracture: Secondary | ICD-10-CM

## 2013-02-01 LAB — LIPID PANEL
Cholesterol: 197 mg/dL (ref 0–200)
HDL: 44 mg/dL (ref >39)
Total CHOL/HDL Ratio: 4.5 Ratio
Triglycerides: 175 mg/dL — ABNORMAL HIGH (ref <150)

## 2013-02-01 NOTE — Patient Instructions (Addendum)
Please complete lab work prior to leaving. Follow up as scheduled for B12. Follow up in 6 months for office visit.

## 2013-02-01 NOTE — Assessment & Plan Note (Signed)
Reports to be feeling much better, denies suicidal ideations, has recently become involved in her group home activities.  Continue taking Citalopram.

## 2013-02-01 NOTE — Progress Notes (Signed)
  Subjective:    Patient ID: Abigail Castillo, female    DOB: 12/25/1950, 62 y.o.   MRN: [id number]  HPI [name] is a 62 y/o female who presents today for follow up of multiple medical problems.  COPD) Denies shortness of breath, chest pain.  Patient quit smoking December 2013.  Does nebulizer treatments as needed approximately twice weekly.   Osteoporosis) Patient taking Fosamax and Caltrate calcium chews. Denies falls, injury, weakness.  Depression) Patient taking Citalopram daily, reports to be feeling better, denies increased sleep and suicidal ideation.  Patient reports she's recently become active in the group home.    Review of Systems  Respiratory: Negative for cough, chest tightness and shortness of breath.   Cardiovascular: Negative for chest pain.  Psychiatric/Behavioral: Negative for suicidal ideas, confusion and sleep disturbance.   See HPI.    Objective:   Physical Exam  Constitutional: She is oriented to person, place, and time. She appears well-nourished.  Pulmonary/Chest: No respiratory distress.  Neurological: She is oriented to person, place, and time.          Assessment & Plan:

## 2013-02-01 NOTE — Assessment & Plan Note (Signed)
Denies falls, injury, weakness. Continue taking Fosamax and Caltrate chews.

## 2013-02-01 NOTE — Assessment & Plan Note (Signed)
Denies shortness of breath and chest pain. Quit smoking December 2013. Continue nebulized treatments as needed.

## 2013-02-01 NOTE — Progress Notes (Signed)
Subjective:    Patient ID: Abigail Castillo, female    DOB: 05/26/1951, 62 y.o.   MRN: [id number]  HPI    Review of Systems Past Medical History  Diagnosis Date  . Osteoporosis   . COPD (chronic obstructive pulmonary disease)   . Tobacco abuse     ongoing  . DEPRESSION 05/31/2009  . Chronic mental illness   . Vitamin D deficiency   . Vitamin B 12 deficiency   . Anxiety   . Emphysema of lung   . Blood transfusion     History   Social History  . Marital Status: Divorced    Spouse Name: N/A    Number of Children: 3  . Years of Education: N/A   Occupational History  . disabled    Social History Main Topics  . Smoking status: Former Smoker    Types: Cigarettes    Quit date: 08/12/2011  . Smokeless tobacco: Never Used  . Alcohol Use: No  . Drug Use: No  . Sexual Activity: Not on file   Other Topics Concern  . Not on file   Social History Narrative  . No narrative on file    Past Surgical History  Procedure Laterality Date  . Cholecystectomy    . Breast surgery      lumpectomy, left  . Cervical cone biopsy      Family History  Problem Relation Age of Onset  . Coronary artery disease Mother   . Diabetes Mother   . Hypertension Mother   . Coronary artery disease Father   . Diabetes Father     Grandfather  . Hypertension Father   . Stroke Father   . Other Father     colostomy bag  . Breast cancer Sister   . Cancer Other     niece--breast  . Diabetes Sister   . Hypertension Sister   . Mental illness Sister     x 4  . Esophageal cancer Neg Hx   . Stomach cancer Neg Hx   . Rectal cancer Neg Hx     Allergies  Allergen Reactions  . Sulfonamide Derivatives Hives    Current Outpatient Prescriptions on File Prior to Visit  Medication Sig Dispense Refill  . alendronate (FOSAMAX) 70 MG tablet Take 1 tablet (70 mg total) by mouth every 7 (seven) days. Take with a full glass of water on an empty stomach.  4 tablet  11  . ALPRAZolam (XANAX) 0.25 MG  tablet Take 0.25 mg by mouth daily as needed.        . Calcium Carbonate-Vitamin D (CALTRATE 600+D) 600-400 MG-UNIT per tablet Take 1 tablet by mouth 2 (two) times daily.      . citalopram (CELEXA) 20 MG tablet Take 1 tablet (20 mg total) by mouth daily.  14 tablet  0  . ipratropium-albuterol (DUONEB) 0.5-2.5 (3) MG/3ML SOLN Take 3 mLs by nebulization 4 (four) times daily as needed.        . mirtazapine (REMERON) 15 MG tablet Take 15 mg by mouth at bedtime.         No current facility-administered medications on file prior to visit.    BP 113/65  Pulse 71  Temp(Src) 98.4 F (36.9 C) (Oral)  Resp 16  Wt 126 lb (57.153 kg)  BMI 23.82 kg/m2  SpO2 99%       Objective:   Physical Exam        Assessment & Plan:  I have seen and examined Abigail Castillo and agree with Abigail Castillo- N students exam, assessment and plan.

## 2013-02-02 ENCOUNTER — Encounter: Payer: Self-pay | Admitting: Family

## 2013-02-17 DIAGNOSIS — F411 Generalized anxiety disorder: Secondary | ICD-10-CM | POA: Diagnosis not present

## 2013-02-17 DIAGNOSIS — F319 Bipolar disorder, unspecified: Secondary | ICD-10-CM | POA: Diagnosis not present

## 2013-02-17 DIAGNOSIS — Z87891 Personal history of nicotine dependence: Secondary | ICD-10-CM | POA: Diagnosis not present

## 2013-02-18 ENCOUNTER — Ambulatory Visit (INDEPENDENT_AMBULATORY_CARE_PROVIDER_SITE_OTHER): Payer: Medicare Other | Admitting: Family

## 2013-02-18 DIAGNOSIS — E538 Deficiency of other specified B group vitamins: Secondary | ICD-10-CM

## 2013-02-18 MED ORDER — CYANOCOBALAMIN 1000 MCG/ML IJ SOLN
1000.0000 ug | Freq: Once | INTRAMUSCULAR | Status: AC
  Administered 2013-02-18: 1000 ug via INTRAMUSCULAR

## 2013-02-18 NOTE — Progress Notes (Signed)
  Subjective:    Patient ID: Abigail Castillo, female    DOB: 1950/07/11, 62 y.o.   MRN: [id number]  HPI    Review of Systems     Objective:   Physical Exam        Assessment & Plan:  Patient came in today for a B12 injection. Pt tolerated injection well

## 2013-03-04 ENCOUNTER — Ambulatory Visit (INDEPENDENT_AMBULATORY_CARE_PROVIDER_SITE_OTHER): Payer: Medicare Other | Admitting: Family

## 2013-03-04 ENCOUNTER — Encounter: Payer: Self-pay | Admitting: Family

## 2013-03-04 VITALS — BP 92/70 | HR 79 | Temp 97.9°F | Resp 16 | Wt 128.0 lb

## 2013-03-04 DIAGNOSIS — E538 Deficiency of other specified B group vitamins: Secondary | ICD-10-CM

## 2013-03-04 DIAGNOSIS — F329 Major depressive disorder, single episode, unspecified: Secondary | ICD-10-CM

## 2013-03-04 DIAGNOSIS — J449 Chronic obstructive pulmonary disease, unspecified: Secondary | ICD-10-CM

## 2013-03-04 NOTE — Assessment & Plan Note (Signed)
Clinically stable.  Continue monthly b12 injections.

## 2013-03-04 NOTE — Assessment & Plan Note (Signed)
Stable, continue current meds 

## 2013-03-04 NOTE — Progress Notes (Signed)
Subjective:    Patient ID: Abigail Castillo, female    DOB: 11-Jan-1951, 62 y.o.   MRN: [id number]  HPI  Abigail Castillo is a 62 yr old female who presents today to have her fl2 form filled.   Copd- stable. She is maintained on symbicort.   Depression- reports that this is well controlled. She is managed by psychiatry.  b12 deficiency- she continues with b12 injections monthly.    Review of Systems See HPI  Past Medical History  Diagnosis Date  . Osteoporosis   . COPD (chronic obstructive pulmonary disease)   . Tobacco abuse     ongoing  . DEPRESSION 05/31/2009  . Chronic mental illness   . Vitamin D deficiency   . Vitamin B 12 deficiency   . Anxiety   . Emphysema of lung   . Blood transfusion     History   Social History  . Marital Status: Divorced    Spouse Name: N/A    Number of Children: 3  . Years of Education: N/A   Occupational History  . disabled    Social History Main Topics  . Smoking status: Former Smoker    Types: Cigarettes    Quit date: 08/12/2011  . Smokeless tobacco: Never Used  . Alcohol Use: No  . Drug Use: No  . Sexual Activity: Not on file   Other Topics Concern  . Not on file   Social History Narrative  . No narrative on file    Past Surgical History  Procedure Laterality Date  . Cholecystectomy    . Breast surgery      lumpectomy, left  . Cervical cone biopsy      Family History  Problem Relation Age of Onset  . Coronary artery disease Mother   . Diabetes Mother   . Hypertension Mother   . Coronary artery disease Father   . Diabetes Father     Grandfather  . Hypertension Father   . Stroke Father   . Other Father     colostomy bag  . Breast cancer Sister   . Cancer Other     niece--breast  . Diabetes Sister   . Hypertension Sister   . Mental illness Sister     x 4  . Esophageal cancer Neg Hx   . Stomach cancer Neg Hx   . Rectal cancer Neg Hx     Allergies  Allergen Reactions  . Sulfonamide Derivatives Hives     Current Outpatient Prescriptions on File Prior to Visit  Medication Sig Dispense Refill  . ALPRAZolam (XANAX) 0.25 MG tablet Take 0.25 mg by mouth daily as needed.        . Calcium Carbonate-Vitamin D (CALTRATE 600+D) 600-400 MG-UNIT per tablet Take 1 tablet by mouth 2 (two) times daily.      . citalopram (CELEXA) 20 MG tablet Take 1 tablet (20 mg total) by mouth daily.  14 tablet  0  . ipratropium-albuterol (DUONEB) 0.5-2.5 (3) MG/3ML SOLN Take 3 mLs by nebulization 4 (four) times daily as needed.         No current facility-administered medications on file prior to visit.    BP 92/70  Pulse 79  Temp(Src) 97.9 F (36.6 C) (Oral)  Resp 16  Wt 128 lb (58.06 kg)  BMI 24.2 kg/m2  SpO2 98%       Objective:   Physical Exam  Constitutional: She is oriented to person, place, and time. She appears well-developed and well-nourished. No distress.  HENT:  Head: Normocephalic and atraumatic.  Cardiovascular: Normal rate and regular rhythm.   No murmur heard. Pulmonary/Chest: Effort normal and breath sounds normal. No respiratory distress. She has no wheezes. She has no rales. She exhibits no tenderness.  Musculoskeletal: She exhibits no edema.  Neurological: She is alert and oriented to person, place, and time.  Psychiatric: She has a normal mood and affect. Her behavior is normal. Judgment and thought content normal.          Assessment & Plan:

## 2013-03-04 NOTE — Assessment & Plan Note (Signed)
Stable on current meds. Continue same.  FL2 signed today.

## 2013-03-04 NOTE — Patient Instructions (Signed)
Please follow up after 10/26 for b12 injection.  Follow up in 6 months for routine follow up.

## 2013-05-16 ENCOUNTER — Ambulatory Visit (INDEPENDENT_AMBULATORY_CARE_PROVIDER_SITE_OTHER): Payer: Medicare Other | Admitting: Family

## 2013-05-16 DIAGNOSIS — E538 Deficiency of other specified B group vitamins: Secondary | ICD-10-CM | POA: Diagnosis not present

## 2013-05-16 MED ORDER — CYANOCOBALAMIN 1000 MCG/ML IJ SOLN
1000.0000 ug | Freq: Once | INTRAMUSCULAR | Status: AC
  Administered 2013-05-16: 1000 ug via INTRAMUSCULAR

## 2013-06-13 ENCOUNTER — Ambulatory Visit (INDEPENDENT_AMBULATORY_CARE_PROVIDER_SITE_OTHER): Payer: Medicare Other | Admitting: Family

## 2013-06-13 ENCOUNTER — Encounter: Payer: Self-pay | Admitting: Family

## 2013-06-13 VITALS — BP 104/70 | HR 88 | Temp 98.0°F | Resp 18 | Ht 63.0 in | Wt 123.1 lb

## 2013-06-13 DIAGNOSIS — N63 Unspecified lump in unspecified breast: Secondary | ICD-10-CM | POA: Diagnosis not present

## 2013-06-13 DIAGNOSIS — Z01419 Encounter for gynecological examination (general) (routine) without abnormal findings: Secondary | ICD-10-CM | POA: Diagnosis not present

## 2013-06-13 DIAGNOSIS — Z1231 Encounter for screening mammogram for malignant neoplasm of breast: Secondary | ICD-10-CM

## 2013-06-13 DIAGNOSIS — E538 Deficiency of other specified B group vitamins: Secondary | ICD-10-CM

## 2013-06-13 DIAGNOSIS — N6310 Unspecified lump in the right breast, unspecified quadrant: Secondary | ICD-10-CM

## 2013-06-13 DIAGNOSIS — Z1239 Encounter for other screening for malignant neoplasm of breast: Secondary | ICD-10-CM

## 2013-06-13 MED ORDER — CYANOCOBALAMIN 1000 MCG/ML IJ SOLN
1000.0000 ug | Freq: Once | INTRAMUSCULAR | Status: AC
  Administered 2013-06-13: 1000 ug via INTRAMUSCULAR

## 2013-06-13 NOTE — Progress Notes (Signed)
   Subjective:    Patient ID: VESSIE OLMSTED, female    DOB: 01/14/1951, 63 y.o.   MRN: [id number]  HPI  Ms. Lorino is a 63 yr old female who presents today for pap smear and b12 injection. She initially reports no history of hysterectomy but later remembers that she did have hysterectomy.  Review of Systems See HPI    Objective:   Physical Exam  Genitourinary:  Breasts: Examined lying and sitting.  Right: retraction beneath right areola at  8 oclock with pea sized  mass non-mobile Left: Without masses, retractions, discharge or axillary adenopathy.  Inguinal/mons: Normal without inguinal adenopathy  External genitalia: Normal  BUS/Urethra/Skene's glands: Normal  Bladder: Normal  Vagina: Normal- atrophic Cervix: surgically absent Uterus: absent Adnexa/parametria:  Rt: Without masses or tenderness.  Lt: Without masses or tenderness.  Anus and perineum: Normal            Assessment & Plan:

## 2013-06-13 NOTE — Patient Instructions (Addendum)
You will be contacted re: your breast imaging.  Please call us if you have not been contacted re: this appointment by the end of the week.  Follow up in 3 months for office visit, 1 month for b12.

## 2013-06-14 DIAGNOSIS — Z01419 Encounter for gynecological examination (general) (routine) without abnormal findings: Secondary | ICD-10-CM | POA: Insufficient documentation

## 2013-06-14 DIAGNOSIS — N6310 Unspecified lump in the right breast, unspecified quadrant: Secondary | ICD-10-CM | POA: Insufficient documentation

## 2013-06-14 NOTE — Assessment & Plan Note (Signed)
Exam very concerning. Refer for diagnostic mammogram and ultrasound.

## 2013-06-14 NOTE — Assessment & Plan Note (Addendum)
Normal pelvic exam. Pap not performed due to surgical absence of cervix.

## 2013-06-15 ENCOUNTER — Telehealth: Payer: Self-pay | Admitting: Family

## 2013-06-15 NOTE — Telephone Encounter (Signed)
Opened in error

## 2013-06-22 ENCOUNTER — Ambulatory Visit
Admission: RE | Admit: 2013-06-22 | Discharge: 2013-06-22 | Disposition: A | Discharge status: Home / Self Care | Payer: Medicare Other | Source: Ambulatory Visit | Attending: Family | Admitting: Family

## 2013-06-22 DIAGNOSIS — Z803 Family history of malignant neoplasm of breast: Secondary | ICD-10-CM | POA: Diagnosis not present

## 2013-06-22 DIAGNOSIS — N63 Unspecified lump in unspecified breast: Secondary | ICD-10-CM

## 2013-06-22 DIAGNOSIS — R928 Other abnormal and inconclusive findings on diagnostic imaging of breast: Secondary | ICD-10-CM | POA: Diagnosis not present

## 2013-06-24 ENCOUNTER — Telehealth: Payer: Self-pay | Admitting: Family

## 2013-06-24 NOTE — Telephone Encounter (Signed)
Spoke with Peter Congo at [facility], she states pt's daughter told them that she was not aware of the pt ever having had a hysterectomy and she didn't understand why we did not do the pap smear.  Advised her per Provider's note and verbal from provider that she did not see or palpate cervix on exam per 06/13/13 office note and pt told her she thought she may have had a hysterectomy in the past.

## 2013-06-24 NOTE — Telephone Encounter (Signed)
Pasty Spillers at [facility] needs to speak to Gilmore Laroche about [employer] [phone]

## 2013-07-01 ENCOUNTER — Ambulatory Visit (INDEPENDENT_AMBULATORY_CARE_PROVIDER_SITE_OTHER): Payer: Medicaid Other | Admitting: Surgery

## 2013-07-04 ENCOUNTER — Encounter (INDEPENDENT_AMBULATORY_CARE_PROVIDER_SITE_OTHER): Payer: Self-pay | Admitting: Surgery

## 2013-07-04 ENCOUNTER — Ambulatory Visit (INDEPENDENT_AMBULATORY_CARE_PROVIDER_SITE_OTHER): Payer: Medicare Other | Admitting: Surgery

## 2013-07-04 VITALS — BP 124/76 | HR 76 | Temp 98.0°F | Resp 18 | Ht 62.0 in | Wt 125.0 lb

## 2013-07-04 DIAGNOSIS — N63 Unspecified lump in unspecified breast: Secondary | ICD-10-CM | POA: Diagnosis not present

## 2013-07-04 NOTE — Patient Instructions (Signed)
Breast Biopsy  A breast biopsy is a procedure where a sample of breast tissue is removed from your breast. The tissue is examined under a microscope to see if cancerous cells are present. A breast biopsy is done when there is:  · Any undiagnosed breast mass (tumor).  · Nipple abnormalities, dimpling, crusting, or ulcerations.  · Abnormal discharge from the nipple, especially blood.  · Redness, swelling, and pain of the breast.  · Calcium deposits (calcifications) or abnormalities seen on a mammogram, ultrasound result, or results of magnetic resonance imaging (MRI).  · Suspicious changes in the breast seen on your mammogram.  If the tumor is found to be cancerous (malignant), a breast biopsy can help to determine what the best treatment is for you. There are many different types of breast biopsies. Talk to your caregiver about your options and which type is best for you.  LET YOUR CAREGIVER KNOW ABOUT:  · Allergies to food or medicine.  · Medicines taken, including vitamins, herbs, eyedrops, over-the-counter medicines, and creams.  · Use of steroids (by mouth or creams).  · Previous problems with anesthetics or numbing medicines.  · History of bleeding problems or blood clots.  · Previous surgery.  · Other health problems, including diabetes and kidney problems.  · Any recent colds or infections.  · Possibility of pregnancy, if this applies.  RISKS AND COMPLICATIONS   · Bleeding.  · Infection.  · Allergy to medicines.  · Bruising and swelling of the breast.  · Alteration in the shape of the breast.  · Not finding the lump or abnormality.  · Needing more surgery.  BEFORE THE PROCEDURE  · Arrange for someone to drive you home after the procedure.  · Do not smoke for 2 weeks before the procedure. Stop smoking, if you smoke.  · Do not drink alcohol for 24 hours before procedure.  · Wear a good support bra to the procedure.  PROCEDURE   You may be given a medicine to numb the breast area (local anesthesia) or a medicine  to make you sleep (general anesthesia) during the procedure. The following are the different types of biopsies that can be performed.   · Fine-needle aspiration A thin needle is attached to a syringe and inserted into the breast lump. Fluid and cells are removed and then looked at under a microscope. If the breast lump cannot be felt, an ultrasound may be used to help locate the lump and place the needle in the correct area.    · Core needle biopsy A wide, hollow needle (core needle) is inserted into the breast lump 3 6 times to get tissue samples or cores. The samples are removed. The needle is usually placed in the correct area by using an ultrasound or X-ray.    · Stereotactic biopsy X-ray equipment and a computer are used to analyze X-ray pictures of the breast lump. The computer then finds exactly where the core needle needs to be inserted. Tissue samples are removed.    · Vacuum-assisted biopsy A small incision (less than ¼ inch) is made in your breast. A biopsy device that includes a hollow needle and vacuum is passed through the incision and into the breast tissue. The vacuum gently draws abnormal breast tissue into the needle to remove it. This type of biopsy removes a larger tissue sample than a regular core needle biopsy. No stitches are needed, and there is usually little scarring.  · Ultrasound-guided core needle biopsy A high frequency ultrasound helps guide   the core needle to the area of the mass or abnormality. An incision is made to insert the needle. Tissue samples are removed.  · Open biopsy A larger incision is made in the breast. Your caregiver will attempt to remove the whole breast lump or as much as possible.  AFTER THE PROCEDURE  · You will be taken to the recovery area. If you are doing well and have no problems, you will be allowed to go home.  · You may notice bruising on your breast. This is normal.  · Your caregiver may apply a pressure dressing on your breast for 24 48 hours. A  pressure dressing is a bandage that is wrapped tightly around the chest to stop fluid from collecting underneath tissues.  Document Released: 05/12/2005 Document Revised: 09/06/2012 Document Reviewed: 06/12/2011  [employer]® Patient Information ©2014 [employer].

## 2013-07-04 NOTE — Progress Notes (Signed)
Patient ID: Abigail Castillo, female   DOB: 1951/04/25, 63 y.o.   MRN: [id number]  Chief Complaint  Patient presents with  . New Evaluation    excision of cyst rt breast     HPI Abigail Castillo is a 63 y.o. female.  Patient sat request of Dr. Sadie Haber or right breast mass. This was detected 2 months ago by primary care. Location is right nipple and subareolar 8:00 position. Strong family history of breast cancer. 2 sisters have had breast cancer.not painful. No nipple discharge. HPI  Past Medical History  Diagnosis Date  . Osteoporosis   . COPD (chronic obstructive pulmonary disease)   . Tobacco abuse     ongoing  . DEPRESSION 05/31/2009  . Chronic mental illness   . Vitamin D deficiency   . Vitamin B 12 deficiency   . Anxiety   . Emphysema of lung   . Blood transfusion     Past Surgical History  Procedure Laterality Date  . Cholecystectomy    . Breast surgery      lumpectomy, left  . Cervical cone biopsy    . Breast lumpectomy  1972    left breast    Family History  Problem Relation Age of Onset  . Coronary artery disease Mother   . Diabetes Mother   . Hypertension Mother   . Coronary artery disease Father   . Diabetes Father     Grandfather  . Hypertension Father   . Stroke Father   . Other Father     colostomy bag  . Breast cancer Sister   . Cancer Other     niece--breast  . Diabetes Sister   . Hypertension Sister   . Mental illness Sister     x 4  . Esophageal cancer Neg Hx   . Stomach cancer Neg Hx   . Rectal cancer Neg Hx     Social History History  Substance Use Topics  . Smoking status: Former Smoker    Types: Cigarettes    Quit date: 08/12/2011  . Smokeless tobacco: Never Used  . Alcohol Use: No    Allergies  Allergen Reactions  . Sulfonamide Derivatives Hives    Current Outpatient Prescriptions  Medication Sig Dispense Refill  . alendronate (FOSAMAX) 70 MG tablet Take 70 mg by mouth every 7 (seven) days. Take with a full glass of water on  an empty stomach.      . ALPRAZolam (XANAX) 0.25 MG tablet Take 0.25 mg by mouth daily as needed.        . Calcium Carbonate-Vitamin D (CALTRATE 600+D) 600-400 MG-UNIT per tablet Take 1 tablet by mouth 2 (two) times daily.      . cholecalciferol (VITAMIN D) 1000 UNITS tablet Take 1,000 Units by mouth daily.      . citalopram (CELEXA) 20 MG tablet Take 1 tablet (20 mg total) by mouth daily.  14 tablet  0  . guaiFENesin (MUCINEX) 600 MG 12 hr tablet Take 1,200 mg by mouth 2 (two) times daily as needed for congestion.      Marland Kitchen ipratropium-albuterol (DUONEB) 0.5-2.5 (3) MG/3ML SOLN Take 3 mLs by nebulization 4 (four) times daily as needed.        Marland Kitchen OLANZapine (ZYPREXA) 5 MG tablet Take 5 mg by mouth at bedtime.      . budesonide-formoterol (SYMBICORT) 160-4.5 MCG/ACT inhaler Inhale 2 puffs into the lungs 2 (two) times daily as needed.      . loperamide (IMODIUM A-D) 2 MG  tablet Take 2 mg by mouth as needed for diarrhea or loose stools.       No current facility-administered medications for this visit.    Review of Systems Review of Systems  Constitutional: Negative.   HENT: Negative.   Eyes: Negative.   Respiratory: Negative for shortness of breath and wheezing.   Cardiovascular: Negative.   Gastrointestinal: Negative.   Genitourinary: Negative.   Musculoskeletal: Negative.   Allergic/Immunologic: Negative.   Neurological: Negative.   Hematological: Negative.   Psychiatric/Behavioral: Negative.     Blood pressure 124/76, pulse 76, temperature 98 F (36.7 C), resp. rate 18, height 5\' 2"  (1.575 m), weight 125 lb (56.7 kg).  Physical Exam Physical Exam  Constitutional: She is oriented to person, place, and time. She appears well-developed and well-nourished.  HENT:  Head: Normocephalic.  Mouth/Throat: No oropharyngeal exudate.  Eyes: EOM are normal. Pupils are equal, round, and reactive to light.  Neck: Normal range of motion. Neck supple.  Cardiovascular: Normal rate and regular  rhythm.   Pulmonary/Chest: Right breast exhibits no nipple discharge, no skin change and no tenderness. Left breast exhibits mass. Left breast exhibits no nipple discharge, no skin change and no tenderness. Breasts are symmetrical.    Musculoskeletal: Normal range of motion.  Neurological: She is alert and oriented to person, place, and time.  Skin: Skin is warm and dry.  Psychiatric: She has a normal mood and affect. Her behavior is normal. Judgment and thought content normal.    Data Reviewed CLINICAL DATA: Patient's physician feels a small mass at 8 o'clock  beneath the right areola and has noted overlying skin retraction.  The patient has 3 sisters with breast cancer.  EXAM:  DIGITAL DIAGNOSTIC BILATERAL MAMMOGRAM WITH CAD  ULTRASOUND RIGHT BREAST  COMPARISON: 02/13/2012  ACR Breast Density Category b: There are scattered areas of  fibroglandular density.  FINDINGS:  There is no suspicious dominant mass, architectural distortion, or  calcification to suggest malignancy in either breast. There is a  degenerating fibroadenoma in the right upper outer quadrant at  middle depth.  Mammographic images were processed with CAD.  On physical exam, I palpate a 3-4 mm firm superficial nodule at 7:30  o'clock at the right areolar border. There is slight dimpling of the  skin in this location.  Ultrasound is performed, showing an oval horizontally oriented  predominantly cystic nodule at 730 o'clock at the right areolar  border just beneath the skin, measuring 4 x 2 x 4 mm. This does have  vascular flow in the wall. There is no tract leading to the skin to  suggest that this is a sebaceous cyst. This is too superficial to  allow for percutaneous needle biopsy. Given the very strong family  history of breast cancer, I would suggest surgical consultation for  consideration of excision. Surgical consultation has been scheduled  with Dr. Brantley Stage on 07/01/2013 at 9:45 a.m.  IMPRESSION:    Slightly suspicious nodule at 7:30 o'clock at the right areolar  border just beneath the skin.  RECOMMENDATION:  Surgical consultation is suggested for consideration of excision.  This has been scheduled with Dr. Brantley Stage on 07/01/2013 at 9:45 a.m.  I have discussed the findings and recommendations with the patient.  Results were also provided in writing at the conclusion of the  visit.  BI-RADS CATEGORY 4: Suspicious abnormality - biopsy should be  considered.  Electronically Signed  By: Ulyess Blossom M.D.  On: 06/22/2013 10:23   Assessment    Right breast  mass    Plan    Recommend excisional right breast biopsy since too superficial for core biopsy.The procedure has been discussed with the patient. Alternatives to surgery have been discussed with the patient.  Risks of surgery include bleeding,  Infection,  Seroma formation, death,  and the need for further surgery.   The patient understands and wishes to proceed.       Ayana Imhof A. 07/04/2013, 11:53 AM

## 2013-07-18 ENCOUNTER — Ambulatory Visit (INDEPENDENT_AMBULATORY_CARE_PROVIDER_SITE_OTHER): Payer: Medicare Other | Admitting: Family

## 2013-07-18 DIAGNOSIS — E538 Deficiency of other specified B group vitamins: Secondary | ICD-10-CM

## 2013-07-18 MED ORDER — CYANOCOBALAMIN 1000 MCG/ML IJ SOLN
1000.0000 ug | Freq: Once | INTRAMUSCULAR | Status: AC
  Administered 2013-07-18: 1000 ug via INTRAMUSCULAR

## 2013-07-23 DIAGNOSIS — Z01818 Encounter for other preprocedural examination: Secondary | ICD-10-CM | POA: Diagnosis not present

## 2013-07-28 ENCOUNTER — Other Ambulatory Visit (INDEPENDENT_AMBULATORY_CARE_PROVIDER_SITE_OTHER): Payer: Self-pay | Admitting: Surgery

## 2013-07-28 ENCOUNTER — Other Ambulatory Visit (HOSPITAL_COMMUNITY)
Admission: RE | Admit: 2013-07-28 | Discharge: 2013-07-28 | Disposition: A | Discharge status: Home / Self Care | Payer: Medicare Other | Source: Ambulatory Visit | Attending: Surgery | Admitting: Surgery

## 2013-07-28 ENCOUNTER — Other Ambulatory Visit (INDEPENDENT_AMBULATORY_CARE_PROVIDER_SITE_OTHER): Payer: Self-pay | Admitting: *Deleted

## 2013-07-28 DIAGNOSIS — N63 Unspecified lump in unspecified breast: Secondary | ICD-10-CM | POA: Insufficient documentation

## 2013-07-28 DIAGNOSIS — N6019 Diffuse cystic mastopathy of unspecified breast: Secondary | ICD-10-CM | POA: Diagnosis not present

## 2013-07-28 DIAGNOSIS — N6089 Other benign mammary dysplasias of unspecified breast: Secondary | ICD-10-CM | POA: Diagnosis not present

## 2013-07-28 MED ORDER — HYDROCODONE-ACETAMINOPHEN 5-325 MG PO TABS: 1.0000 | ORAL_TABLET | ORAL | Status: DC | PRN

## 2013-08-03 ENCOUNTER — Telehealth (INDEPENDENT_AMBULATORY_CARE_PROVIDER_SITE_OTHER): Payer: Self-pay

## 2013-08-03 NOTE — Telephone Encounter (Signed)
LMOM> path benign, appt info left

## 2013-08-03 NOTE — Telephone Encounter (Signed)
Message copied by Carlene Coria on Wed Aug 03, 2013  2:45 PM ------      Message from: Erroll Luna A      Created: Mon Aug 01, 2013  1:36 PM       Benign. ------

## 2013-08-11 DIAGNOSIS — Z87891 Personal history of nicotine dependence: Secondary | ICD-10-CM | POA: Diagnosis not present

## 2013-08-11 DIAGNOSIS — F319 Bipolar disorder, unspecified: Secondary | ICD-10-CM | POA: Diagnosis not present

## 2013-08-11 DIAGNOSIS — F411 Generalized anxiety disorder: Secondary | ICD-10-CM | POA: Diagnosis not present

## 2013-08-15 ENCOUNTER — Ambulatory Visit (INDEPENDENT_AMBULATORY_CARE_PROVIDER_SITE_OTHER): Payer: Medicare Other | Admitting: Surgery

## 2013-08-15 ENCOUNTER — Ambulatory Visit (INDEPENDENT_AMBULATORY_CARE_PROVIDER_SITE_OTHER): Payer: Medicare Other | Admitting: Family

## 2013-08-15 ENCOUNTER — Ambulatory Visit: Payer: Medicare Other

## 2013-08-15 ENCOUNTER — Encounter (INDEPENDENT_AMBULATORY_CARE_PROVIDER_SITE_OTHER): Payer: Self-pay | Admitting: Surgery

## 2013-08-15 VITALS — BP 107/78 | HR 72 | Temp 98.7°F | Resp 16 | Ht 67.0 in | Wt 126.6 lb

## 2013-08-15 DIAGNOSIS — Z9889 Other specified postprocedural states: Secondary | ICD-10-CM | POA: Insufficient documentation

## 2013-08-15 DIAGNOSIS — E538 Deficiency of other specified B group vitamins: Secondary | ICD-10-CM

## 2013-08-15 MED ORDER — CYANOCOBALAMIN 1000 MCG/ML IJ SOLN
1000.0000 ug | Freq: Once | INTRAMUSCULAR | Status: AC
  Administered 2013-08-15: 1000 ug via INTRAMUSCULAR

## 2013-08-15 NOTE — Progress Notes (Signed)
HADAS JESSOP    [id number] 08/15/2013    19-Jul-1950   CC:   Chief Complaint  Patient presents with  . Routine Post Op    right breast lump     HPI:  The patient returns for post op follow-up. She underwent a right breast lumpectomy on 07/28/2013. Over all she feels that she is doing well.   PE: VITAL SIGNS: BP 107/78  Pulse 72  Temp(Src) 98.7 F (37.1 C) (Temporal)  Resp 16  Ht 5\' 7"  (1.702 m)  Wt 126 lb 9.6 oz (57.425 kg)  BMI 19.82 kg/m2  Breast: The incision is healing nicely and there is no evidence of infection or hematoma.    DATA REVIEWED: Pathology report: Breast, excision, Right - RADIAL SCAR WITH USUAL DUCTAL HYPERPLASIA. - FIBROCYSTIC CHANGES. - THERE IS NO EVIDENCE OF MALIGNANCY. - SEE COMMENT. Microscopic Comment  IMPRESSION: Patient doing well.   PLAN: Her next visit will be prn. No restrictions.

## 2013-08-15 NOTE — Patient Instructions (Signed)
Return as needed.  Resume full activity. 

## 2013-08-16 ENCOUNTER — Ambulatory Visit: Payer: Medicare Other

## 2013-10-24 ENCOUNTER — Telehealth: Payer: Self-pay | Admitting: *Deleted

## 2013-10-24 NOTE — Telephone Encounter (Signed)
Received message from [facility] requesting new Rxs on all of patients prescriptions as pt is a new customer to them. Spoke with Peter Congo at [facility] and verified that pt is still a patient in their facility. Facility has been using [facility] for their residents' Rxs. She will check and see if this is changing and will call me back.

## 2013-10-24 NOTE — Telephone Encounter (Signed)
Ok

## 2013-10-24 NOTE — Telephone Encounter (Signed)
Received message from [place] at [facility] that they have changed pharmacies and are now using [facility]. Is it ok to fax rxs of all medications that are pended? Also, pt was due for 3 month follow up in April. Will need to schedule follow up as well.

## 2013-10-25 MED ORDER — CITALOPRAM HYDROBROMIDE 20 MG PO TABS: 20.0000 mg | ORAL_TABLET | Freq: Every day | ORAL | Status: DC

## 2013-10-25 MED ORDER — BUDESONIDE-FORMOTEROL FUMARATE 160-4.5 MCG/ACT IN AERO: 2.0000 | INHALATION_SPRAY | Freq: Two times a day (BID) | RESPIRATORY_TRACT | Status: DC | PRN

## 2013-10-25 MED ORDER — ALENDRONATE SODIUM 70 MG PO TABS: 70.0000 mg | ORAL_TABLET | ORAL | Status: DC

## 2013-10-25 MED ORDER — OLANZAPINE 5 MG PO TABS: 5.0000 mg | ORAL_TABLET | Freq: Every day | ORAL | Status: DC

## 2013-10-25 MED ORDER — ALPRAZOLAM 0.25 MG PO TABS: 0.2500 mg | ORAL_TABLET | Freq: Every day | ORAL | Status: DC | PRN

## 2013-10-25 MED ORDER — IPRATROPIUM-ALBUTEROL 0.5-2.5 (3) MG/3ML IN SOLN: 3.0000 mL | Freq: Four times a day (QID) | RESPIRATORY_TRACT | Status: DC | PRN

## 2013-10-25 NOTE — Telephone Encounter (Signed)
Rxs printed and faxed to pharmacy. Please call Aldona Bar [phone]) to arrange follow up soon.

## 2013-10-25 NOTE — Telephone Encounter (Signed)
Informed Abigail Castillo of medication refill to [facility] and she scheduled appointment for 10/28/13

## 2013-10-28 ENCOUNTER — Ambulatory Visit (INDEPENDENT_AMBULATORY_CARE_PROVIDER_SITE_OTHER): Payer: Medicare Other | Admitting: Family

## 2013-10-28 ENCOUNTER — Encounter: Payer: Self-pay | Admitting: Family

## 2013-10-28 ENCOUNTER — Ambulatory Visit: Payer: Medicare Other

## 2013-10-28 ENCOUNTER — Ambulatory Visit: Payer: Medicare Other | Admitting: Family

## 2013-10-28 VITALS — BP 100/78 | HR 73 | Temp 97.8°F | Resp 16 | Ht 63.0 in | Wt 128.1 lb

## 2013-10-28 DIAGNOSIS — J449 Chronic obstructive pulmonary disease, unspecified: Secondary | ICD-10-CM

## 2013-10-28 DIAGNOSIS — N63 Unspecified lump in unspecified breast: Secondary | ICD-10-CM | POA: Diagnosis not present

## 2013-10-28 DIAGNOSIS — E785 Hyperlipidemia, unspecified: Secondary | ICD-10-CM

## 2013-10-28 DIAGNOSIS — F3289 Other specified depressive episodes: Secondary | ICD-10-CM | POA: Diagnosis not present

## 2013-10-28 DIAGNOSIS — E538 Deficiency of other specified B group vitamins: Secondary | ICD-10-CM

## 2013-10-28 DIAGNOSIS — F329 Major depressive disorder, single episode, unspecified: Secondary | ICD-10-CM

## 2013-10-28 DIAGNOSIS — E781 Pure hyperglyceridemia: Secondary | ICD-10-CM

## 2013-10-28 MED ORDER — CYANOCOBALAMIN 1000 MCG/ML IJ SOLN
1000.0000 ug | Freq: Once | INTRAMUSCULAR | Status: AC
Start: 2013-10-28 — End: 2013-10-28
  Administered 2013-10-28: 1000 ug via INTRAMUSCULAR

## 2013-10-28 NOTE — Progress Notes (Signed)
Pre visit review using our clinic review tool, if applicable. No additional management support is needed unless otherwise documented below in the visit note. 

## 2013-10-28 NOTE — Assessment & Plan Note (Signed)
Stable, management per psychiatry.

## 2013-10-28 NOTE — Assessment & Plan Note (Signed)
Path negative.

## 2013-10-28 NOTE — Assessment & Plan Note (Signed)
Check cbc, continue monthly b12 injections.

## 2013-10-28 NOTE — Assessment & Plan Note (Signed)
Check follow up FLP.

## 2013-10-28 NOTE — Patient Instructions (Signed)
Complete lab work prior to leaving. Follow up in 3 months for medicare wellness visit.

## 2013-10-28 NOTE — Progress Notes (Signed)
Subjective:    Patient ID: Abigail Castillo, female    DOB: [birth year], 63 y.o.   MRN: [id number]  HPI  Abigail Castillo is a 63 yr old female who presents today for follow up of multiple medical problems:  1) Depression- managed by psychiatry.  2) B12 deficiency- she continues monthly b12 injections.    3) Hyperlipidemia- diet controlled.   Lab Results  Component Value Date   CHOL 197 02/01/2013   HDL 44 02/01/2013   LDLCALC 118* 02/01/2013   TRIG 175* 02/01/2013   CHOLHDL 4.5 02/01/2013   4) Breast mass- she had excisional biopsy with Dr. Brantley Stage back in March. Path: Right - RADIAL SCAR WITH USUAL DUCTAL HYPERPLASIA. - FIBROCYSTIC CHANGES. - THERE IS NO EVIDENCE OF MALIGNANCY.  5) COPD-  Maintained on symbicort.  Uses duonebs as needed. Reports that she has not needed recently.  Review of Systems See HPI  Past Medical History  Diagnosis Date  . Osteoporosis   . COPD (chronic obstructive pulmonary disease)   . Tobacco abuse     ongoing  . DEPRESSION 05/31/2009  . Chronic mental illness   . Vitamin D deficiency   . Vitamin B 12 deficiency   . Anxiety   . Emphysema of lung   . Blood transfusion     History   Social History  . Marital Status: Divorced    Spouse Name: N/A    Number of Children: 3  . Years of Education: N/A   Occupational History  . disabled    Social History Main Topics  . Smoking status: Former Smoker    Types: Cigarettes    Quit date: 08/12/2011  . Smokeless tobacco: Never Used  . Alcohol Use: No  . Drug Use: No  . Sexual Activity: Not on file   Other Topics Concern  . Not on file   Social History Narrative  . No narrative on file    Past Surgical History  Procedure Laterality Date  . Cholecystectomy    . Breast surgery      lumpectomy, left  . Cervical cone biopsy    . Breast lumpectomy  1972    left breast    Family History  Problem Relation Age of Onset  . Coronary artery disease Mother   . Diabetes Mother   . Hypertension  Mother   . Coronary artery disease Father   . Diabetes Father     Grandfather  . Hypertension Father   . Stroke Father   . Other Father     colostomy bag  . Breast cancer Sister   . Cancer Other     niece--breast  . Diabetes Sister   . Hypertension Sister   . Mental illness Sister     x 4  . Esophageal cancer Neg Hx   . Stomach cancer Neg Hx   . Rectal cancer Neg Hx     Allergies  Allergen Reactions  . Sulfonamide Derivatives Hives    Current Outpatient Prescriptions on File Prior to Visit  Medication Sig Dispense Refill  . alendronate (FOSAMAX) 70 MG tablet Take 1 tablet (70 mg total) by mouth every 7 (seven) days. Take with a full glass of water on an empty stomach.  4 tablet  3  . ALPRAZolam (XANAX) 0.25 MG tablet Take 1 tablet (0.25 mg total) by mouth daily as needed.  30 tablet  0  . budesonide-formoterol (SYMBICORT) 160-4.5 MCG/ACT inhaler Inhale 2 puffs into the lungs 2 (two) times daily as  needed.  1 Inhaler  3  . Calcium Carbonate-Vitamin D (CALTRATE 600+D) 600-400 MG-UNIT per tablet Take 1 tablet by mouth 2 (two) times daily.      . cholecalciferol (VITAMIN D) 1000 UNITS tablet Take 1,000 Units by mouth daily.      . citalopram (CELEXA) 20 MG tablet Take 1 tablet (20 mg total) by mouth daily.  30 tablet  3  . guaiFENesin (MUCINEX) 600 MG 12 hr tablet Take 1,200 mg by mouth 2 (two) times daily as needed for congestion.      Marland Kitchen ipratropium-albuterol (DUONEB) 0.5-2.5 (3) MG/3ML SOLN Take 3 mLs by nebulization 4 (four) times daily as needed.  360 mL  3  . loperamide (IMODIUM A-D) 2 MG tablet Take 2 mg by mouth as needed for diarrhea or loose stools.      Marland Kitchen OLANZapine (ZYPREXA) 5 MG tablet Take 1 tablet (5 mg total) by mouth at bedtime.  30 tablet  3   No current facility-administered medications on file prior to visit.    BP 100/78  Pulse 73  Temp(Src) 97.8 F (36.6 C) (Oral)  Resp 16  Ht 5\' 3"  (1.6 m)  Wt 128 lb 1.3 oz (58.097 kg)  BMI 22.69 kg/m2  SpO2  97%        Objective:   Physical Exam  Constitutional: She is oriented to person, place, and time. She appears well-developed and well-nourished. No distress.  HENT:  Head: Normocephalic and atraumatic.  Cardiovascular: Normal rate and regular rhythm.   No murmur heard. Pulmonary/Chest: Effort normal and breath sounds normal. No respiratory distress. She has no wheezes. She has no rales. She exhibits no tenderness.  Musculoskeletal: She exhibits no edema.  Neurological: She is alert and oriented to person, place, and time.  Psychiatric: She has a normal mood and affect. Her behavior is normal. Judgment and thought content normal.          Assessment & Plan:

## 2013-10-28 NOTE — Assessment & Plan Note (Signed)
Maintained on symbicort- stable. Continue same.

## 2013-11-28 ENCOUNTER — Ambulatory Visit (INDEPENDENT_AMBULATORY_CARE_PROVIDER_SITE_OTHER): Payer: Medicare Other | Admitting: *Deleted

## 2013-11-28 DIAGNOSIS — E538 Deficiency of other specified B group vitamins: Secondary | ICD-10-CM

## 2013-11-28 MED ORDER — CYANOCOBALAMIN 1000 MCG/ML IJ SOLN
1000.0000 ug | Freq: Once | INTRAMUSCULAR | Status: AC
  Administered 2013-11-28: 1000 ug via INTRAMUSCULAR

## 2014-01-02 ENCOUNTER — Ambulatory Visit (INDEPENDENT_AMBULATORY_CARE_PROVIDER_SITE_OTHER): Payer: Medicare Other | Admitting: Family

## 2014-01-02 DIAGNOSIS — E538 Deficiency of other specified B group vitamins: Secondary | ICD-10-CM

## 2014-01-02 MED ORDER — CYANOCOBALAMIN 1000 MCG/ML IJ SOLN
1000.0000 ug | Freq: Once | INTRAMUSCULAR | Status: AC
  Administered 2014-01-02: 1000 ug via INTRAMUSCULAR

## 2014-01-04 ENCOUNTER — Telehealth: Payer: Self-pay | Admitting: *Deleted

## 2014-01-04 NOTE — Telephone Encounter (Signed)
I had already spoken with the facility. Medicare wellness has already been scheduled.

## 2014-01-04 NOTE — Telephone Encounter (Signed)
Faxed FL2 and care plan to [phone].  Upon review of record, pt is due for fasting medicare wellness exam in September. I see she is scheduled for a nurse visit b12 injection on 02/03/14. Can change this appt to medicare wellness exam and we can do the b12 injection at that visit. Also please call the facility([phone]) and let them know that appt will be with the Provider and not just a nurse visit. Amma Zenia Resides is who I usually speak with; she is the Scientist, physiological.  Pt should be fasting for the wellness exam.  Thanks!

## 2014-02-03 ENCOUNTER — Ambulatory Visit (INDEPENDENT_AMBULATORY_CARE_PROVIDER_SITE_OTHER): Payer: Medicare Other | Admitting: Family

## 2014-02-03 ENCOUNTER — Encounter: Payer: Self-pay | Admitting: Family

## 2014-02-03 VITALS — BP 100/70 | HR 58 | Temp 97.8°F | Resp 16 | Ht 62.0 in | Wt 129.2 lb

## 2014-02-03 DIAGNOSIS — F3289 Other specified depressive episodes: Secondary | ICD-10-CM | POA: Diagnosis not present

## 2014-02-03 DIAGNOSIS — N6310 Unspecified lump in the right breast, unspecified quadrant: Secondary | ICD-10-CM

## 2014-02-03 DIAGNOSIS — J449 Chronic obstructive pulmonary disease, unspecified: Secondary | ICD-10-CM

## 2014-02-03 DIAGNOSIS — F329 Major depressive disorder, single episode, unspecified: Secondary | ICD-10-CM | POA: Diagnosis not present

## 2014-02-03 DIAGNOSIS — E538 Deficiency of other specified B group vitamins: Secondary | ICD-10-CM | POA: Diagnosis not present

## 2014-02-03 DIAGNOSIS — Z13820 Encounter for screening for osteoporosis: Secondary | ICD-10-CM | POA: Diagnosis not present

## 2014-02-03 DIAGNOSIS — R7309 Other abnormal glucose: Secondary | ICD-10-CM | POA: Diagnosis not present

## 2014-02-03 DIAGNOSIS — M810 Age-related osteoporosis without current pathological fracture: Secondary | ICD-10-CM

## 2014-02-03 DIAGNOSIS — R739 Hyperglycemia, unspecified: Secondary | ICD-10-CM

## 2014-02-03 DIAGNOSIS — E785 Hyperlipidemia, unspecified: Secondary | ICD-10-CM | POA: Insufficient documentation

## 2014-02-03 DIAGNOSIS — E559 Vitamin D deficiency, unspecified: Secondary | ICD-10-CM | POA: Diagnosis not present

## 2014-02-03 DIAGNOSIS — N63 Unspecified lump in unspecified breast: Secondary | ICD-10-CM

## 2014-02-03 DIAGNOSIS — Z23 Encounter for immunization: Secondary | ICD-10-CM

## 2014-02-03 LAB — LIPID PANEL
CHOL/HDL RATIO: 5
Cholesterol: 213 mg/dL — ABNORMAL HIGH (ref 0–200)
HDL: 40.7 mg/dL (ref >39.00)
LDL Cholesterol: 136 mg/dL — ABNORMAL HIGH (ref 0–99)
NONHDL: 172.3
Triglycerides: 181 mg/dL — ABNORMAL HIGH (ref 0.0–149.0)
VLDL: 36.2 mg/dL (ref 0.0–40.0)

## 2014-02-03 LAB — BASIC METABOLIC PANEL
BUN: 13 mg/dL (ref 6–23)
CO2: 27 meq/L (ref 19–32)
Calcium: 9.4 mg/dL (ref 8.4–10.5)
Chloride: 104 mEq/L (ref 96–112)
Creatinine, Ser: 0.7 mg/dL (ref 0.4–1.2)
GFR: 91.67 mL/min (ref >60.00)
GLUCOSE: 86 mg/dL (ref 70–99)
Potassium: 4 mEq/L (ref 3.5–5.1)
SODIUM: 140 meq/L (ref 135–145)

## 2014-02-03 LAB — HEPATIC FUNCTION PANEL
ALK PHOS: 90 U/L (ref 39–117)
ALT: 17 U/L (ref 0–35)
AST: 23 U/L (ref 0–37)
Albumin: 3.9 g/dL (ref 3.5–5.2)
Bilirubin, Direct: 0.1 mg/dL (ref 0.0–0.3)
TOTAL PROTEIN: 7.2 g/dL (ref 6.0–8.3)
Total Bilirubin: 1.1 mg/dL (ref 0.2–1.2)

## 2014-02-03 LAB — HEMOGLOBIN A1C: Hgb A1c MFr Bld: 5.4 % (ref 4.6–6.5)

## 2014-02-03 LAB — VITAMIN D 25 HYDROXY (VIT D DEFICIENCY, FRACTURES): VITD: 76.6 ng/mL (ref 30.00–100.00)

## 2014-02-03 NOTE — Assessment & Plan Note (Signed)
Stable on current meds. Management per psychiatry.  

## 2014-02-03 NOTE — Assessment & Plan Note (Signed)
Continue fosamax, obtain follow up dexa.

## 2014-02-03 NOTE — Progress Notes (Signed)
Pre visit review using our clinic review tool, if applicable. No additional management support is needed unless otherwise documented below in the visit note. 

## 2014-02-03 NOTE — Assessment & Plan Note (Signed)
Stable on symbicort, commended pt on quitting smoking.

## 2014-02-03 NOTE — Assessment & Plan Note (Signed)
Continue monthly injections.  ?

## 2014-02-03 NOTE — Progress Notes (Signed)
Subjective:    Patient ID: Abigail Castillo, female    DOB: [birth year], 63 y.o.   MRN: [id number]  HPI  Abigail Castillo is a 63 yr old female who presents today for Medicare Wellness Visit.  Subjective:   Patient here for Medicare annual wellness visit and management of other chronic and acute problems.   Immunizations: flu shot today Diet: reports healthy diet Exercise: enjoys walking Colonoscopy: up to date Dexa: due Pap Smear: declines Mammogram:  Up to date   Depression- reports well controlled. She is following with psychiatrist.    COPD- reports breathing is better, quit smoking 1 year ago.  Using symbicort.    R breast biopsy- she had a biopsy which was thankfully benign.    Hyperglycemia- Has had elevated sugars in the past.    Mild hyperlipidemia- last lipids 1 year ago noted elevated trigs and LDL.   Lab Results  Component Value Date   CHOL 197 02/01/2013   HDL 44 02/01/2013   LDLCALC 118* 02/01/2013   TRIG 175* 02/01/2013   CHOLHDL 4.5 02/01/2013    Risk factors:   Roster of Physicians Providing Medical Care to Patient: Dr. Erling Cruz  Activities of Daily Living  In your present state of health, do you have any difficulty performing the following activities? Preparing food and eating?: yes- lives in facility Bathing yourself: No  Getting dressed: No  Using the toilet:No  Moving around from place to place: No  In the past year have you fallen or had a near fall?:No    Home Safety: Has smoke detector and wears seat belts. No firearms. No excess sun exposure.  Diet and Exercise  Current exercise habits: walking, daily Dietary issues discussed: healthy diet   Depression Screen  (Note: if answer to either of the following is "Yes", then a more complete depression screening is indicated)  Q1: Over the past two weeks, have you felt down, depressed or hopeless?no  Q2: Over the past two weeks, have you felt little interest or pleasure in doing things? no   The following  portions of the patient's history were reviewed and updated as appropriate: allergies, current medications, past family history, past medical history, past social history, past surgical history and problem list.  Past Medical History  Diagnosis Date  . Osteoporosis   . COPD (chronic obstructive pulmonary disease)   . Tobacco abuse     ongoing  . DEPRESSION 05/31/2009  . Chronic mental illness   . Vitamin D deficiency   . Vitamin B 12 deficiency   . Anxiety   . Emphysema of lung   . Blood transfusion     History   Social History  . Marital Status: Divorced    Spouse Name: N/A    Number of Children: 3  . Years of Education: N/A   Occupational History  . disabled    Social History Main Topics  . Smoking status: Former Smoker    Types: Cigarettes    Quit date: 08/12/2011  . Smokeless tobacco: Never Used  . Alcohol Use: No  . Drug Use: No  . Sexual Activity: Not on file   Other Topics Concern  . Not on file   Social History Narrative  . No narrative on file    Past Surgical History  Procedure Laterality Date  . Cholecystectomy    . Cervical cone biopsy    . Breast surgery  ?2015    lumpectomy, left  . Breast lumpectomy  1972  left breast    Family History  Problem Relation Age of Onset  . Coronary artery disease Mother   . Diabetes Mother   . Hypertension Mother   . Coronary artery disease Father   . Diabetes Father     Grandfather  . Hypertension Father   . Stroke Father   . Other Father     colostomy bag  . Breast cancer Sister   . Cancer Other     niece--breast  . Diabetes Sister   . Hypertension Sister   . Mental illness Sister     x 4  . Esophageal cancer Neg Hx   . Stomach cancer Neg Hx   . Rectal cancer Neg Hx     Allergies  Allergen Reactions  . Sulfonamide Derivatives Hives    Current Outpatient Prescriptions on File Prior to Visit  Medication Sig Dispense Refill  . alendronate (FOSAMAX) 70 MG tablet Take 1 tablet (70 mg total)  by mouth every 7 (seven) days. Take with a full glass of water on an empty stomach.  4 tablet  3  . ALPRAZolam (XANAX) 0.25 MG tablet Take 1 tablet (0.25 mg total) by mouth daily as needed.  30 tablet  0  . budesonide-formoterol (SYMBICORT) 160-4.5 MCG/ACT inhaler Inhale 2 puffs into the lungs 2 (two) times daily as needed.  1 Inhaler  3  . Calcium Carbonate-Vitamin D (CALTRATE 600+D) 600-400 MG-UNIT per tablet Take 1 tablet by mouth 2 (two) times daily.      . cholecalciferol (VITAMIN D) 1000 UNITS tablet Take 1,000 Units by mouth daily.      . citalopram (CELEXA) 20 MG tablet Take 1 tablet (20 mg total) by mouth daily.  30 tablet  3  . guaiFENesin (MUCINEX) 600 MG 12 hr tablet Take 1,200 mg by mouth 2 (two) times daily as needed for congestion.      Marland Kitchen ipratropium-albuterol (DUONEB) 0.5-2.5 (3) MG/3ML SOLN Take 3 mLs by nebulization 4 (four) times daily as needed.  360 mL  3  . loperamide (IMODIUM A-D) 2 MG tablet Take 2 mg by mouth as needed for diarrhea or loose stools.      Marland Kitchen OLANZapine (ZYPREXA) 5 MG tablet Take 1 tablet (5 mg total) by mouth at bedtime.  30 tablet  3   No current facility-administered medications on file prior to visit.    BP 100/70  Pulse 58  Temp(Src) 97.8 F (36.6 C) (Oral)  Resp 16  Ht 5\' 2"  (1.575 m)  Wt 129 lb 3.2 oz (58.605 kg)  BMI 23.63 kg/m2  SpO2 95%    Objective:   Vision:see nursing Hearing:  Able to hear forced whisper at 6 feet Body mass index: Body mass index is 23.63 kg/(m^2). Cognitive Impairment Assessment: cognition, memory and judgment appear normal.   Physical Exam  Constitutional: She is oriented to person, place, and time. She appears well-developed and well-nourished. No distress.  HENT:  Head: Normocephalic and atraumatic.  Right Ear: Tympanic membrane and ear canal normal.  Left Ear: Tympanic membrane and ear canal normal.  Mouth/Throat: Oropharynx is clear and moist.  Eyes: Pupils are equal, round, and reactive to light. No  scleral icterus.  Neck: Normal range of motion. No thyromegaly present.  Cardiovascular: Normal rate and regular rhythm.   No murmur heard. Pulmonary/Chest: Effort normal and breath sounds normal. No respiratory distress. He has no wheezes. She has no rales. She exhibits no tenderness.  Abdominal: Soft. Bowel sounds are normal. He exhibits no distension  and no mass. There is no tenderness. There is no rebound and no guarding.  Musculoskeletal: She exhibits no edema.  Lymphadenopathy:    She has no cervical adenopathy.  Neurological: She is alert and oriented to person, place, and time.She exhibits normal muscle tone. Coordination normal.  Skin: Skin is warm and dry.  Psychiatric: She has a normal mood and affect. Her behavior is normal. Judgment and thought content normal.  Breasts: Examined lying Right: Without masses, retractions, discharge or axillary adenopathy.  Left: Without masses, retractions, discharge or axillary adenopathy.  Pelvic: Declined          Assessment & Plan:    Assessment:   Medicare wellness utd on preventive parameters- except for bone density and pap Plan:    During the course of the visit the patient was educated and counseled about appropriate screening and preventive services including:        Bone densitometry screening   Vaccines / LABS  Recommend zostavax- she will check coverage with her insurance. Obtain follow up labs.   Patient Instructions (the written plan) was given to the patient.           Review of Systems     Objective:   Physical Exam        Assessment & Plan:

## 2014-02-03 NOTE — Patient Instructions (Addendum)
Please complete lab work prior to leaving. Check with insurance re: coverage for shingles vaccine and if so, we can give it to you next b12shot visit. Follow up in 6 months.

## 2014-02-03 NOTE — Assessment & Plan Note (Signed)
Obtain follow up lipid panel.

## 2014-02-03 NOTE — Assessment & Plan Note (Signed)
Will check vit D level.

## 2014-02-05 ENCOUNTER — Telehealth: Payer: Self-pay | Admitting: Family

## 2014-02-05 NOTE — Telephone Encounter (Signed)
Please notify pt that sugar is well controlled. Vitamin D looks good, continue daily supplement. Cholesterol is above goal. Work on low fat/low cholesterol diet and avoid concentrated sweets.

## 2014-02-06 NOTE — Telephone Encounter (Signed)
Faxed to Attn:  [name] at [phone] [facility]) and mailed to patient.

## 2014-02-07 ENCOUNTER — Ambulatory Visit (INDEPENDENT_AMBULATORY_CARE_PROVIDER_SITE_OTHER)
Admission: RE | Admit: 2014-02-07 | Discharge: 2014-02-07 | Disposition: A | Discharge status: Home / Self Care | Payer: Medicare Other | Source: Ambulatory Visit | Attending: Family | Admitting: Family

## 2014-02-07 DIAGNOSIS — Z13820 Encounter for screening for osteoporosis: Secondary | ICD-10-CM | POA: Diagnosis not present

## 2014-02-08 ENCOUNTER — Encounter: Payer: Self-pay | Admitting: Family

## 2014-02-24 ENCOUNTER — Other Ambulatory Visit: Payer: Self-pay | Admitting: Family

## 2014-02-27 NOTE — Telephone Encounter (Signed)
eScribe request from [facility] for refill on Celexa 20 mg eScribe request from [facility] for refill on Olanzapine 5 mg Last filled - 06.02.15, #30x3 Last AEX - 09.11.15 Next AEX - 6 Mths Please Advise on refills/SLS

## 2014-03-07 ENCOUNTER — Ambulatory Visit (INDEPENDENT_AMBULATORY_CARE_PROVIDER_SITE_OTHER): Payer: Medicare Other

## 2014-03-07 DIAGNOSIS — E538 Deficiency of other specified B group vitamins: Secondary | ICD-10-CM

## 2014-03-07 DIAGNOSIS — D509 Iron deficiency anemia, unspecified: Secondary | ICD-10-CM | POA: Diagnosis not present

## 2014-03-07 MED ORDER — CYANOCOBALAMIN 1000 MCG/ML IJ SOLN
1000.0000 ug | Freq: Once | INTRAMUSCULAR | Status: DC
Start: 2014-03-07 — End: 2014-08-04

## 2014-03-07 MED ORDER — CYANOCOBALAMIN 1000 MCG/ML IJ SOLN
1000.0000 ug | Freq: Once | INTRAMUSCULAR | Status: AC
  Administered 2014-03-07: 1000 ug via INTRAMUSCULAR

## 2014-03-07 NOTE — Addendum Note (Signed)
Addended by: Bunnie Domino on: 03/07/2014 08:54 AM   Modules accepted: Orders

## 2014-03-21 ENCOUNTER — Other Ambulatory Visit: Payer: Self-pay | Admitting: Family

## 2014-04-07 ENCOUNTER — Ambulatory Visit (INDEPENDENT_AMBULATORY_CARE_PROVIDER_SITE_OTHER): Payer: Medicare Other

## 2014-04-07 ENCOUNTER — Telehealth: Payer: Self-pay | Admitting: *Deleted

## 2014-04-07 DIAGNOSIS — E538 Deficiency of other specified B group vitamins: Secondary | ICD-10-CM | POA: Diagnosis not present

## 2014-04-07 MED ORDER — CYANOCOBALAMIN 1000 MCG/ML IJ SOLN
1000.0000 ug | Freq: Once | INTRAMUSCULAR | Status: AC
  Administered 2014-04-07: 1000 ug via INTRAMUSCULAR

## 2014-04-07 NOTE — Progress Notes (Signed)
Pre visit review using our clinic review tool, if applicable. No additional management support is needed unless otherwise documented below in the visit note. 

## 2014-04-07 NOTE — Telephone Encounter (Signed)
Patient received B12 injection today and requested a form to be filled out stating she was here for Medicaid transportation. Form filled out, faxed to [phone], original given to patient and copy sent for scanning. JG//[name]

## 2014-04-07 NOTE — Progress Notes (Signed)
Pt tolerated injection well

## 2014-05-09 ENCOUNTER — Ambulatory Visit (INDEPENDENT_AMBULATORY_CARE_PROVIDER_SITE_OTHER): Payer: Medicare Other

## 2014-05-09 DIAGNOSIS — E538 Deficiency of other specified B group vitamins: Secondary | ICD-10-CM | POA: Diagnosis not present

## 2014-05-09 NOTE — Progress Notes (Signed)
Pre visit review using our clinic review tool, if applicable. No additional management support is needed unless otherwise documented below in the visit note.  Patient provided B12 Injection.  Lot# [id number] Exp 03/2015 NDC# [id number] Left deltoid IM  Patient tolerated injection well.

## 2014-06-27 ENCOUNTER — Other Ambulatory Visit: Payer: Self-pay | Admitting: Family

## 2014-06-27 ENCOUNTER — Telehealth: Payer: Self-pay | Admitting: Family

## 2014-06-27 NOTE — Telephone Encounter (Signed)
B12 rx denied as pt normally gets injection from our office and facility is not able to administer injection themselves.

## 2014-06-27 NOTE — Telephone Encounter (Signed)
Pt last seen 01/2014 and has follow up 08/04/14. Last b12 injection 03/2014. Spoke with Shantay and scheduled b12 injection for tomorrow at 9:15am.  Advised her that we normally give pt b12 iinjection from our office instead of sending Rx to pharmacy and she voices understanding. Advised her that pt has f/u on 08/04/14 with PCP and pt can get B12 injection at that visit for March.

## 2014-06-27 NOTE — Telephone Encounter (Signed)
Caller name: Shantay  Relation to pt:  Caregiver from [facility]  Call back number: [phone] Pharmacy:  Reason for call:  Would like to schedule B12 injection. Caregiver stated pt missed last month appointment. Please advise

## 2014-06-28 ENCOUNTER — Ambulatory Visit (INDEPENDENT_AMBULATORY_CARE_PROVIDER_SITE_OTHER): Payer: Medicare Other | Admitting: *Deleted

## 2014-06-28 DIAGNOSIS — E538 Deficiency of other specified B group vitamins: Secondary | ICD-10-CM

## 2014-06-28 MED ORDER — CYANOCOBALAMIN 1000 MCG/ML IJ SOLN
1000.0000 ug | Freq: Once | INTRAMUSCULAR | Status: AC
  Administered 2014-06-28: 1000 ug via INTRAMUSCULAR

## 2014-07-26 DIAGNOSIS — F259 Schizoaffective disorder, unspecified: Secondary | ICD-10-CM | POA: Diagnosis not present

## 2014-08-04 ENCOUNTER — Ambulatory Visit (INDEPENDENT_AMBULATORY_CARE_PROVIDER_SITE_OTHER): Payer: Medicare Other | Admitting: Family

## 2014-08-04 ENCOUNTER — Encounter: Payer: Self-pay | Admitting: Family

## 2014-08-04 VITALS — BP 102/60 | HR 75 | Temp 97.6°F | Resp 16 | Ht 62.0 in | Wt 130.6 lb

## 2014-08-04 DIAGNOSIS — J449 Chronic obstructive pulmonary disease, unspecified: Secondary | ICD-10-CM

## 2014-08-04 DIAGNOSIS — E538 Deficiency of other specified B group vitamins: Secondary | ICD-10-CM | POA: Diagnosis not present

## 2014-08-04 DIAGNOSIS — F329 Major depressive disorder, single episode, unspecified: Secondary | ICD-10-CM | POA: Diagnosis not present

## 2014-08-04 DIAGNOSIS — F32A Depression, unspecified: Secondary | ICD-10-CM

## 2014-08-04 LAB — CBC WITH DIFFERENTIAL/PLATELET
Basophils Absolute: 0 10*3/uL (ref 0.0–0.1)
Basophils Relative: 0.5 % (ref 0.0–3.0)
EOS ABS: 0.2 10*3/uL (ref 0.0–0.7)
Eosinophils Relative: 2.5 % (ref 0.0–5.0)
HCT: 39.7 % (ref 36.0–46.0)
Hemoglobin: 13.2 g/dL (ref 12.0–15.0)
LYMPHS PCT: 28 % (ref 12.0–46.0)
Lymphs Abs: 1.7 10*3/uL (ref 0.7–4.0)
MCHC: 33.3 g/dL (ref 30.0–36.0)
MCV: 91.6 fl (ref 78.0–100.0)
MONO ABS: 0.4 10*3/uL (ref 0.1–1.0)
Monocytes Relative: 7 % (ref 3.0–12.0)
Neutro Abs: 3.9 10*3/uL (ref 1.4–7.7)
Neutrophils Relative %: 62 % (ref 43.0–77.0)
PLATELETS: 253 10*3/uL (ref 150.0–400.0)
RBC: 4.34 Mil/uL (ref 3.87–5.11)
RDW: 13.8 % (ref 11.5–15.5)
WBC: 6.2 10*3/uL (ref 4.0–10.5)

## 2014-08-04 MED ORDER — CYANOCOBALAMIN 1000 MCG/ML IJ SOLN
1000.0000 ug | Freq: Once | INTRAMUSCULAR | Status: AC
  Administered 2014-08-04: 1000 ug via INTRAMUSCULAR

## 2014-08-04 NOTE — Progress Notes (Signed)
Pre visit review using our clinic review tool, if applicable. No additional management support is needed unless otherwise documented below in the visit note. 

## 2014-08-04 NOTE — Patient Instructions (Signed)
Please complete lab work prior to leaving.  Follow up in 6 months.  

## 2014-08-04 NOTE — Progress Notes (Signed)
Subjective:    Patient ID: Abigail Castillo, female    DOB: June 01, 1950, 64 y.o.   MRN: [id number]  HPI  Pt presents today for follow up.  COPD- only taking symbicort "prn".  Quit smoking >1 year ago.   Depression- continues with psychiatry- on citalopram, zyprexa and alprazolam. Denies current depression symptoms.  B12 deficiency- she continues with monthly b12 injections.  Review of Systems See HPI  Past Medical History  Diagnosis Date  . Osteoporosis   . COPD (chronic obstructive pulmonary disease)   . Tobacco abuse     ongoing  . DEPRESSION 05/31/2009  . Chronic mental illness   . Vitamin D deficiency   . Vitamin B 12 deficiency   . Anxiety   . Emphysema of lung   . Blood transfusion     History   Social History  . Marital Status: Divorced    Spouse Name: N/A  . Number of Children: 3  . Years of Education: N/A   Occupational History  . disabled    Social History Main Topics  . Smoking status: Former Smoker    Types: Cigarettes    Quit date: 08/12/2011  . Smokeless tobacco: Never Used  . Alcohol Use: No  . Drug Use: No  . Sexual Activity: Not on file   Other Topics Concern  . Not on file   Social History Narrative    Past Surgical History  Procedure Laterality Date  . Cholecystectomy    . Cervical cone biopsy    . Breast surgery  ?2015    lumpectomy, left  . Breast lumpectomy  1972    left breast    Family History  Problem Relation Age of Onset  . Coronary artery disease Mother   . Diabetes Mother   . Hypertension Mother   . Coronary artery disease Father   . Diabetes Father     Grandfather  . Hypertension Father   . Stroke Father   . Other Father     colostomy bag  . Breast cancer Sister   . Cancer Other     niece--breast  . Diabetes Sister   . Hypertension Sister   . Mental illness Sister     x 4  . Esophageal cancer Neg Hx   . Stomach cancer Neg Hx   . Rectal cancer Neg Hx     Allergies  Allergen Reactions  . Sulfonamide  Derivatives Hives    Current Outpatient Prescriptions on File Prior to Visit  Medication Sig Dispense Refill  . alendronate (FOSAMAX) 70 MG tablet TAKE 1 TABLET BY MOUTH EVERY SEVEN   DAYS- TAKE WITH A FULL GLASS OF WATER ON AT NOON TIME   EMPTY STOMACH 4 tablet 5  . ALPRAZolam (XANAX) 0.25 MG tablet Take 1 tablet (0.25 mg total) by mouth daily as needed. 30 tablet 0  . budesonide-formoterol (SYMBICORT) 160-4.5 MCG/ACT inhaler Inhale 2 puffs into the lungs 2 (two) times daily as needed. 1 Inhaler 3  . Calcium Carbonate-Vitamin D (CALTRATE 600+D) 600-400 MG-UNIT per tablet Take 1 tablet by mouth 2 (two) times daily.    . cholecalciferol (VITAMIN D) 1000 UNITS tablet Take 1,000 Units by mouth daily.    . citalopram (CELEXA) 20 MG tablet TAKE 1 TABLET BY MOUTH ONCE DAILY 30 tablet 5  . guaiFENesin (MUCINEX) 600 MG 12 hr tablet Take 1,200 mg by mouth 2 (two) times daily as needed for congestion.    Marland Kitchen ipratropium-albuterol (DUONEB) 0.5-2.5 (3) MG/3ML SOLN Take 3  mLs by nebulization 4 (four) times daily as needed. 360 mL 3  . loperamide (IMODIUM A-D) 2 MG tablet Take 2 mg by mouth as needed for diarrhea or loose stools.    Marland Kitchen OLANZapine (ZYPREXA) 5 MG tablet TAKE 1 TABLET BY MOUTH DAILY AT BEDTIME 30 tablet 5   No current facility-administered medications on file prior to visit.    BP 102/60 mmHg  Pulse 75  Temp(Src) 97.6 F (36.4 C) (Oral)  Resp 16  Ht 5\' 2"  (1.575 m)  Wt 130 lb 9.6 oz (59.24 kg)  BMI 23.88 kg/m2  SpO2 97%       Objective:   Physical Exam  Constitutional: She is oriented to person, place, and time. She appears well-developed and well-nourished.  Cardiovascular: Normal rate, regular rhythm and normal heart sounds.   No murmur heard. Pulmonary/Chest: Effort normal and breath sounds normal. No respiratory distress. She has no wheezes.  Musculoskeletal: She exhibits no edema.  Neurological: She is alert and oriented to person, place, and time.  Skin: Skin is warm and  dry.  Psychiatric: She has a normal mood and affect. Her behavior is normal. Judgment and thought content normal.          Assessment & Plan:

## 2014-08-05 DIAGNOSIS — J441 Chronic obstructive pulmonary disease with (acute) exacerbation: Secondary | ICD-10-CM | POA: Insufficient documentation

## 2014-08-05 NOTE — Assessment & Plan Note (Signed)
Stable, managed by psychiatry.  

## 2014-08-05 NOTE — Assessment & Plan Note (Signed)
Stable but I have advised pt to use symbicort bid for prevention of symptoms not prn.

## 2014-08-05 NOTE — Assessment & Plan Note (Signed)
Stable continue monthly b12 injections.  Obtain cbc. Lab Results  Component Value Date   WBC 6.2 08/04/2014   HGB 13.2 08/04/2014   HCT 39.7 08/04/2014   MCV 91.6 08/04/2014   PLT 253.0 08/04/2014

## 2014-08-15 ENCOUNTER — Other Ambulatory Visit: Payer: Self-pay | Admitting: Family

## 2014-09-06 ENCOUNTER — Ambulatory Visit (INDEPENDENT_AMBULATORY_CARE_PROVIDER_SITE_OTHER): Payer: Medicare Other | Admitting: *Deleted

## 2014-09-06 DIAGNOSIS — E538 Deficiency of other specified B group vitamins: Secondary | ICD-10-CM

## 2014-09-06 MED ORDER — CYANOCOBALAMIN 1000 MCG/ML IJ SOLN
1000.0000 ug | Freq: Once | INTRAMUSCULAR | Status: AC
  Administered 2014-09-06: 1000 ug via INTRAMUSCULAR

## 2014-09-06 NOTE — Progress Notes (Signed)
Pre visit review using our clinic review tool, if applicable. No additional management support is needed unless otherwise documented below in the visit note.  Patient tolerated injection well.  

## 2014-10-06 ENCOUNTER — Ambulatory Visit (INDEPENDENT_AMBULATORY_CARE_PROVIDER_SITE_OTHER): Payer: Medicare Other | Admitting: *Deleted

## 2014-10-06 DIAGNOSIS — E538 Deficiency of other specified B group vitamins: Secondary | ICD-10-CM | POA: Diagnosis not present

## 2014-10-06 MED ORDER — CYANOCOBALAMIN 1000 MCG/ML IJ SOLN
1000.0000 ug | Freq: Once | INTRAMUSCULAR | Status: AC
  Administered 2014-10-06: 1000 ug via INTRAMUSCULAR

## 2014-10-06 NOTE — Progress Notes (Signed)
Pre visit review using our clinic review tool, if applicable. No additional management support is needed unless otherwise documented below in the visit note.  Patient tolerated injection well.  Next injection scheduled 11/03/14. 

## 2014-11-03 ENCOUNTER — Ambulatory Visit (INDEPENDENT_AMBULATORY_CARE_PROVIDER_SITE_OTHER): Payer: Medicare Other | Admitting: *Deleted

## 2014-11-03 DIAGNOSIS — E538 Deficiency of other specified B group vitamins: Secondary | ICD-10-CM

## 2014-11-03 MED ORDER — CYANOCOBALAMIN 1000 MCG/ML IJ SOLN
1000.0000 ug | Freq: Once | INTRAMUSCULAR | Status: AC
  Administered 2014-11-03: 1000 ug via INTRAMUSCULAR

## 2014-11-03 NOTE — Progress Notes (Signed)
Pre visit review using our clinic review tool, if applicable. No additional management support is needed unless otherwise documented below in the visit note.  Per office visit:  B12 deficiency - [name], NP at 08/05/2014 9:11 AM     Status: Written Related Problem: B12 deficiency   Expand All Collapse All   Stable continue monthly b12 injections. Obtain cbc.       Patient tolerated injection well.  Next injection scheduled 12/05/14.

## 2014-12-05 ENCOUNTER — Ambulatory Visit: Payer: Medicare Other

## 2014-12-08 ENCOUNTER — Ambulatory Visit: Payer: Medicare Other

## 2014-12-12 ENCOUNTER — Ambulatory Visit (INDEPENDENT_AMBULATORY_CARE_PROVIDER_SITE_OTHER): Payer: Medicare Other

## 2014-12-12 DIAGNOSIS — E538 Deficiency of other specified B group vitamins: Secondary | ICD-10-CM

## 2014-12-12 MED ORDER — CYANOCOBALAMIN 1000 MCG/ML IJ SOLN
1000.0000 ug | Freq: Once | INTRAMUSCULAR | Status: AC
  Administered 2014-12-12: 1000 ug via INTRAMUSCULAR

## 2014-12-12 NOTE — Progress Notes (Signed)
Pre visit review using our clinic review tool, if applicable. No additional management support is needed unless otherwise documented below in the visit note.  Patient tolerated injection well.  Next injection scheduled for 01/09/15.

## 2015-01-01 ENCOUNTER — Other Ambulatory Visit: Payer: Self-pay | Admitting: *Deleted

## 2015-01-01 ENCOUNTER — Telehealth: Payer: Self-pay | Admitting: *Deleted

## 2015-01-01 MED ORDER — ALPRAZOLAM 0.25 MG PO TABS: 0.2500 mg | ORAL_TABLET | Freq: Every day | ORAL | Status: DC | PRN

## 2015-01-01 MED ORDER — IPRATROPIUM-ALBUTEROL 0.5-2.5 (3) MG/3ML IN SOLN: 3.0000 mL | Freq: Four times a day (QID) | RESPIRATORY_TRACT | Status: DC | PRN

## 2015-01-01 MED ORDER — BUDESONIDE-FORMOTEROL FUMARATE 160-4.5 MCG/ACT IN AERO: 2.0000 | INHALATION_SPRAY | Freq: Two times a day (BID) | RESPIRATORY_TRACT | Status: DC | PRN

## 2015-01-01 MED ORDER — LOPERAMIDE HCL 2 MG PO TABS: 2.0000 mg | ORAL_TABLET | ORAL | Status: DC | PRN

## 2015-01-01 NOTE — Telephone Encounter (Signed)
Received fax from [name], Administrator of [facility] requesting signature of [place]-2 and Care Plan. Forms forwarded to PCP for review / signature.

## 2015-01-01 NOTE — Telephone Encounter (Signed)
Received fax from [facility] for: duoneb, symbicort, immodium and alprazolam refills. Pt has f/u scheduled for 02/05/15. Rxs pended, please advise.

## 2015-01-02 ENCOUNTER — Telehealth: Payer: Self-pay | Admitting: *Deleted

## 2015-01-02 NOTE — Telephone Encounter (Signed)
Rx faxed to pharmacy  

## 2015-01-02 NOTE — Telephone Encounter (Signed)
Prior authorization for ipratropium-albuterol initiated. Awaiting determination. JG//[name]

## 2015-01-03 NOTE — Telephone Encounter (Signed)
Signed.

## 2015-01-03 NOTE — Telephone Encounter (Signed)
Forms faxed successfully to [facility] at [employer]. Sent for scanning. JG//[name]

## 2015-01-09 ENCOUNTER — Ambulatory Visit (INDEPENDENT_AMBULATORY_CARE_PROVIDER_SITE_OTHER): Payer: Medicare Other | Admitting: *Deleted

## 2015-01-09 DIAGNOSIS — E538 Deficiency of other specified B group vitamins: Secondary | ICD-10-CM

## 2015-01-09 MED ORDER — CYANOCOBALAMIN 1000 MCG/ML IJ SOLN
1000.0000 ug | Freq: Once | INTRAMUSCULAR | Status: AC
  Administered 2015-01-09: 1000 ug via INTRAMUSCULAR

## 2015-01-09 NOTE — Telephone Encounter (Signed)
PA and appeal both denied under Medicare Part D. Insurance states medication should be covered under Medicare Part A because they are paying for skilled nursing facility stay. JG//[name]

## 2015-01-09 NOTE — Progress Notes (Signed)
Pre visit review using our clinic review tool, if applicable. No additional management support is needed unless otherwise documented below in the visit note.  Patient tolerated injection well.  Transportation form faxed to [place] per patient request.   Transportation called for patient pick-up.

## 2015-01-17 DIAGNOSIS — F259 Schizoaffective disorder, unspecified: Secondary | ICD-10-CM | POA: Diagnosis not present

## 2015-01-26 ENCOUNTER — Other Ambulatory Visit: Payer: Self-pay | Admitting: Family

## 2015-01-26 NOTE — Telephone Encounter (Signed)
ok 

## 2015-01-26 NOTE — Telephone Encounter (Signed)
Last alprazolam Rx 01/01/15, #30.  Pt has f/u 02/05/15 with PCP.  Please advise:  Name from pharmacy:  In chart as:  ALPRAZOLAM 0.25MG  TAB ALPRAZolam (XANAX) 0.25 MG tablet     Sig: TAKE ONE TABLET  BY MOUTH  DAILY  AS NEEDED    Dispense: 30 tablet   Start: 01/26/2015   Class: Normal    Requested on: 01/01/2015    Originally ordered on: 10/03/2010 01/02/2015

## 2015-01-30 NOTE — Telephone Encounter (Signed)
Rx called to pharmacist as below. 

## 2015-02-05 ENCOUNTER — Ambulatory Visit (INDEPENDENT_AMBULATORY_CARE_PROVIDER_SITE_OTHER): Payer: Medicare Other | Admitting: Family

## 2015-02-05 ENCOUNTER — Encounter: Payer: Self-pay | Admitting: Family

## 2015-02-05 VITALS — BP 100/62 | HR 78 | Temp 97.8°F | Resp 16 | Ht 62.0 in | Wt 119.8 lb

## 2015-02-05 DIAGNOSIS — Z23 Encounter for immunization: Secondary | ICD-10-CM

## 2015-02-05 DIAGNOSIS — F329 Major depressive disorder, single episode, unspecified: Secondary | ICD-10-CM

## 2015-02-05 DIAGNOSIS — E785 Hyperlipidemia, unspecified: Secondary | ICD-10-CM

## 2015-02-05 DIAGNOSIS — R112 Nausea with vomiting, unspecified: Secondary | ICD-10-CM

## 2015-02-05 DIAGNOSIS — E538 Deficiency of other specified B group vitamins: Secondary | ICD-10-CM | POA: Diagnosis not present

## 2015-02-05 DIAGNOSIS — F32A Depression, unspecified: Secondary | ICD-10-CM

## 2015-02-05 LAB — LIPID PANEL
Cholesterol: 177 mg/dL (ref 0–200)
HDL: 41.4 mg/dL
LDL Cholesterol: 110 mg/dL — ABNORMAL HIGH (ref 0–99)
NonHDL: 135.94
Total CHOL/HDL Ratio: 4
Triglycerides: 130 mg/dL (ref 0.0–149.0)
VLDL: 26 mg/dL (ref 0.0–40.0)

## 2015-02-05 LAB — CBC WITH DIFFERENTIAL/PLATELET
Basophils Absolute: 0 K/uL (ref 0.0–0.1)
Basophils Relative: 0.4 % (ref 0.0–3.0)
Eosinophils Absolute: 0.2 K/uL (ref 0.0–0.7)
Eosinophils Relative: 2.7 % (ref 0.0–5.0)
HCT: 39.1 % (ref 36.0–46.0)
Hemoglobin: 13.1 g/dL (ref 12.0–15.0)
Lymphocytes Relative: 27.6 % (ref 12.0–46.0)
Lymphs Abs: 1.8 K/uL (ref 0.7–4.0)
MCHC: 33.4 g/dL (ref 30.0–36.0)
MCV: 91.8 fl (ref 78.0–100.0)
Monocytes Absolute: 0.5 K/uL (ref 0.1–1.0)
Monocytes Relative: 7.4 % (ref 3.0–12.0)
Neutro Abs: 4.1 K/uL (ref 1.4–7.7)
Neutrophils Relative %: 61.9 % (ref 43.0–77.0)
Platelets: 229 K/uL (ref 150.0–400.0)
RBC: 4.26 Mil/uL (ref 3.87–5.11)
RDW: 13 % (ref 11.5–15.5)
WBC: 6.7 K/uL (ref 4.0–10.5)

## 2015-02-05 LAB — HEPATIC FUNCTION PANEL
ALT: 12 U/L (ref 0–35)
AST: 14 U/L (ref 0–37)
Albumin: 3.8 g/dL (ref 3.5–5.2)
Alkaline Phosphatase: 75 U/L (ref 39–117)
BILIRUBIN DIRECT: 0.2 mg/dL (ref 0.0–0.3)
TOTAL PROTEIN: 6.4 g/dL (ref 6.0–8.3)
Total Bilirubin: 1.2 mg/dL (ref 0.2–1.2)

## 2015-02-05 LAB — LIPASE: Lipase: 22 U/L (ref 11.0–59.0)

## 2015-02-05 LAB — VITAMIN B12: Vitamin B-12: >1500 pg/mL — ABNORMAL HIGH (ref 211–911)

## 2015-02-05 MED ORDER — CYANOCOBALAMIN 1000 MCG/ML IJ SOLN
1000.0000 ug | Freq: Once | INTRAMUSCULAR | Status: AC
  Administered 2015-02-05: 1000 ug via INTRAMUSCULAR

## 2015-02-05 MED ORDER — RANITIDINE HCL 150 MG PO TABS: 150.0000 mg | ORAL_TABLET | Freq: Two times a day (BID) | ORAL | Status: DC

## 2015-02-05 MED ORDER — ALENDRONATE SODIUM 70 MG PO TABS: ORAL_TABLET | ORAL | Status: DC

## 2015-02-05 NOTE — Progress Notes (Signed)
Subjective:    Patient ID: Abigail Castillo, female    DOB: [birth year], 64 y.o.   MRN: [id number]  HPI  Ms. Peedin is a 64 yr old female who presents today for follow up of multiple medical problems.  1) Hyperlipidemia-   Lab Results  Component Value Date   CHOL 213* 02/03/2014   HDL 40.70 02/03/2014   LDLCALC 136* 02/03/2014   TRIG 181.0* 02/03/2014   CHOLHDL 5 02/03/2014   2) Depression- managed by psychiatry. She sees Dr. Erling Cruz for psychiatry. Reports depression is well controlled.   3) B12 deficiency-   Lab Results  Component Value Date   VITAMINB12 >2000* 06/18/2012   4) Nausea/vomitting- has been occuring x 2 months.  Reports that her appetite is good.  Has intermittent nausea and vomiting. Reports that in 1 week she vomits about every other day.  She denies diarrhea/black/bloody stools or abdominal pain. Feels better after she vomits.    Review of Systems    see HPI  Past Medical History  Diagnosis Date  . Osteoporosis   . COPD (chronic obstructive pulmonary disease)   . Tobacco abuse     ongoing  . DEPRESSION 05/31/2009  . Chronic mental illness   . Vitamin D deficiency   . Vitamin B 12 deficiency   . Anxiety   . Emphysema of lung   . Blood transfusion     Social History   Social History  . Marital Status: Divorced    Spouse Name: N/A  . Number of Children: 3  . Years of Education: N/A   Occupational History  . disabled    Social History Main Topics  . Smoking status: Former Smoker    Types: Cigarettes    Quit date: 08/12/2011  . Smokeless tobacco: Never Used  . Alcohol Use: No  . Drug Use: No  . Sexual Activity: Not on file   Other Topics Concern  . Not on file   Social History Narrative    Past Surgical History  Procedure Laterality Date  . Cholecystectomy    . Cervical cone biopsy    . Breast surgery  ?2015    lumpectomy, left  . Breast lumpectomy  1972    left breast    Family History  Problem Relation Age of Onset  .  Coronary artery disease Mother   . Diabetes Mother   . Hypertension Mother   . Coronary artery disease Father   . Diabetes Father     Grandfather  . Hypertension Father   . Stroke Father   . Other Father     colostomy bag  . Breast cancer Sister   . Cancer Other     niece--breast  . Diabetes Sister   . Hypertension Sister   . Mental illness Sister     x 4  . Esophageal cancer Neg Hx   . Stomach cancer Neg Hx   . Rectal cancer Neg Hx     Allergies  Allergen Reactions  . Sulfonamide Derivatives Hives    Current Outpatient Prescriptions on File Prior to Visit  Medication Sig Dispense Refill  . alendronate (FOSAMAX) 70 MG tablet TAKE 1 TABLET BY MOUTH EVERY SEVEN   DAYS- TAKE WITH A FULL GLASS OF WATER ON AT NOON TIME   EMPTY STOMACH 4 tablet 6  . ALPRAZolam (XANAX) 0.25 MG tablet TAKE ONE TABLET   BY MOUTH   DAILY   AS NEEDED 30 tablet 0  . budesonide-formoterol (SYMBICORT) 160-4.5 MCG/ACT inhaler  Inhale 2 puffs into the lungs 2 (two) times daily as needed. 1 Inhaler 3  . Calcium Carbonate-Vitamin D (CALTRATE 600+D) 600-400 MG-UNIT per tablet Take 1 tablet by mouth 2 (two) times daily.    . cholecalciferol (VITAMIN D) 1000 UNITS tablet Take 1,000 Units by mouth daily.    . citalopram (CELEXA) 20 MG tablet TAKE 1 TABLET BY MOUTH ONCE DAILY 30 tablet 6  . guaiFENesin (MUCINEX) 600 MG 12 hr tablet Take 1,200 mg by mouth 2 (two) times daily as needed for congestion.    Marland Kitchen ipratropium-albuterol (DUONEB) 0.5-2.5 (3) MG/3ML SOLN Take 3 mLs by nebulization 4 (four) times daily as needed. 360 mL 3  . loperamide (IMODIUM A-D) 2 MG tablet Take 1 tablet (2 mg total) by mouth as needed for diarrhea or loose stools. 30 tablet 1  . OLANZapine (ZYPREXA) 5 MG tablet TAKE 1 TABLET BY MOUTH DAILY AT BEDTIME 30 tablet 6   No current facility-administered medications on file prior to visit.    BP 100/62 mmHg  Pulse 78  Temp(Src) 97.8 F (36.6 C) (Oral)  Resp 16  Ht 5\' 2"  (1.575 m)  Wt 119 lb  12.8 oz (54.341 kg)  BMI 21.91 kg/m2  SpO2 96%    Objective:   Physical Exam  Constitutional: She is oriented to person, place, and time. She appears well-developed and well-nourished.  HENT:  Head: Normocephalic and atraumatic.  Cardiovascular: Normal rate, regular rhythm and normal heart sounds.   No murmur heard. Pulmonary/Chest: Effort normal and breath sounds normal. No respiratory distress. She has no wheezes.  Abdominal: Soft. Bowel sounds are normal. She exhibits no distension and no mass. There is no tenderness. There is no rebound and no guarding.  Neurological: She is alert and oriented to person, place, and time.  Skin: Skin is warm and dry.  Psychiatric: She has a normal mood and affect. Her behavior is normal. Judgment and thought content normal.          Assessment & Plan:  Nausea/vomitting- pt is s/p cholecystectomy. Obtain cbc to assess for anemia. Obtain LFT. Add zantac, refer to GI for further evaluation.

## 2015-02-05 NOTE — Progress Notes (Signed)
Pre visit review using our clinic review tool, if applicable. No additional management support is needed unless otherwise documented below in the visit note. 

## 2015-02-05 NOTE — Assessment & Plan Note (Signed)
Obtain flp  

## 2015-02-05 NOTE — Patient Instructions (Addendum)
Please request zyprex and citalopram refills from Dr. Erling Cruz. Great job quitting smoking! Please complete lab work prior to leaving. Start zantac for acid in your stomach. You will be contacted about your referral to Dr. Deatra Ina for further evaluation of your nausea/vomitting. Call if you develop abdominal pain, black/bloody stools or if you are unable to keep down food/drink.  Follow up in 6 months.

## 2015-02-05 NOTE — Addendum Note (Signed)
Addended by: Kelle Darting A on: 02/05/2015 09:33 AM   Modules accepted: Orders

## 2015-02-05 NOTE — Assessment & Plan Note (Signed)
Stable, management per psych 

## 2015-02-05 NOTE — Assessment & Plan Note (Signed)
Obtain b12 level.  Continue monthly b12 injections.

## 2015-02-06 ENCOUNTER — Encounter: Payer: Self-pay | Admitting: Family

## 2015-02-13 ENCOUNTER — Ambulatory Visit: Payer: Medicare Other | Admitting: Gastroenterology

## 2015-02-23 ENCOUNTER — Other Ambulatory Visit: Payer: Self-pay | Admitting: Family

## 2015-02-26 NOTE — Telephone Encounter (Signed)
Last Rx 01/30/15, has f/u 07/2015.  Rx printed and forwarded to PCP for signature.  Name from pharmacy:  In chart as:  ALPRAZOLAM 0.25MG  TAB ALPRAZolam (XANAX) 0.25 MG tablet     Sig: TAKE ONE TABLET  BY MOUTH  DAILY  AS NEEDED    Dispense: 30 tablet   Start: 02/23/2015   Class: Normal    Requested on: 01/30/2015    Originally ordered on: 10/03/2010 02/16/2015

## 2015-02-26 NOTE — Telephone Encounter (Signed)
Rx faxed to pharmacy  

## 2015-02-28 ENCOUNTER — Ambulatory Visit (INDEPENDENT_AMBULATORY_CARE_PROVIDER_SITE_OTHER): Payer: Medicare Other | Admitting: Physician Assistant

## 2015-02-28 ENCOUNTER — Other Ambulatory Visit (INDEPENDENT_AMBULATORY_CARE_PROVIDER_SITE_OTHER): Payer: Medicare Other

## 2015-02-28 ENCOUNTER — Encounter: Payer: Self-pay | Admitting: Physician Assistant

## 2015-02-28 VITALS — BP 110/80 | HR 72 | Ht 62.0 in | Wt 121.2 lb

## 2015-02-28 DIAGNOSIS — R110 Nausea: Secondary | ICD-10-CM | POA: Diagnosis not present

## 2015-02-28 DIAGNOSIS — K219 Gastro-esophageal reflux disease without esophagitis: Secondary | ICD-10-CM

## 2015-02-28 LAB — HEPATIC FUNCTION PANEL
ALBUMIN: 4 g/dL (ref 3.5–5.2)
ALK PHOS: 100 U/L (ref 39–117)
ALT: 23 U/L (ref 0–35)
AST: 20 U/L (ref 0–37)
Bilirubin, Direct: 0.2 mg/dL (ref 0.0–0.3)
TOTAL PROTEIN: 6.9 g/dL (ref 6.0–8.3)
Total Bilirubin: 1 mg/dL (ref 0.2–1.2)

## 2015-02-28 LAB — LIPASE: Lipase: 36 U/L (ref 11.0–59.0)

## 2015-02-28 MED ORDER — OMEPRAZOLE 20 MG PO CPDR: 20.0000 mg | DELAYED_RELEASE_CAPSULE | Freq: Every day | ORAL | Status: DC

## 2015-02-28 NOTE — Patient Instructions (Addendum)
You have been scheduled for an endoscopy. Please follow written instructions given to you at your visit today. If you use inhalers (even only as needed), please bring them with you on the day of your procedure.  We have sent the following medications to your pharmacy for you to pick up at your convenience: Omeprazole  Your physician has requested that you go to the basement for the following lab work before leaving today: Lipase and hepatic panel   Food Choices for Gastroesophageal Reflux Disease, Adult When you have gastroesophageal reflux disease (GERD), the foods you eat and your eating habits are very important. Choosing the right foods can help ease the discomfort of GERD. WHAT GENERAL GUIDELINES DO I NEED TO FOLLOW?  Choose fruits, vegetables, whole grains, low-fat dairy products, and low-fat meat, fish, and poultry.  Limit fats such as oils, salad dressings, butter, nuts, and avocado.  Keep a food diary to identify foods that cause symptoms.  Avoid foods that cause reflux. These may be different for different people.  Eat frequent small meals instead of three large meals each day.  Eat your meals slowly, in a relaxed setting.  Limit fried foods.  Cook foods using methods other than frying.  Avoid drinking alcohol.  Avoid drinking large amounts of liquids with your meals.  Avoid bending over or lying down until 2-3 hours after eating. WHAT FOODS ARE NOT RECOMMENDED? The following are some foods and drinks that may worsen your symptoms: Vegetables Tomatoes. Tomato juice. Tomato and spaghetti sauce. Chili peppers. Onion and garlic. Horseradish. Fruits Oranges, grapefruit, and lemon (fruit and juice). Meats High-fat meats, fish, and poultry. This includes hot dogs, ribs, ham, sausage, salami, and bacon. Dairy Whole milk and chocolate milk. Sour cream. Cream. Butter. Ice cream. Cream cheese.  Beverages Coffee and tea, with or without caffeine. Carbonated beverages or  energy drinks. Condiments Hot sauce. Barbecue sauce.  Sweets/Desserts Chocolate and cocoa. Donuts. Peppermint and spearmint. Fats and Oils High-fat foods, including [place] fries and potato chips. Other Vinegar. Strong spices, such as black pepper, white pepper, red pepper, cayenne, curry powder, cloves, ginger, and chili powder. The items listed above may not be a complete list of foods and beverages to avoid. Contact your dietitian for more information.   This information is not intended to replace advice given to you by your health care provider. Make sure you discuss any questions you have with your health care provider.   Document Released: 05/12/2005 Document Revised: 06/02/2014 Document Reviewed: 03/16/2013 Elsevier Interactive Patient Education [employer].

## 2015-02-28 NOTE — Progress Notes (Signed)
Patient ID: Abigail Castillo, female   DOB: [birth year], 64 y.o.   MRN: [id number]     History of Present Illness: Abigail Castillo is a pleasant 64 year old female who was previously followed by Dr. Deatra Ina he had a colonoscopy in September 2013. Random biopsy was were negative. Multiple adenomatous polyps were removed. She was advised to have surveillance in 2018. She has a history of COPD, depression, anxiety, osteoporosis, vitamin B-12 deficiency, vitamin D deficiency, and emphysema.  She presents today with a 1-1/2-2 month history of nausea and vomiting. She states she has been vomiting intermittently. Her nausea and vomiting tend to be worse after meals. She feels full after several bites and says she has lost about 12 pounds in 2 months. She has no heartburn but she has been belching and burping a lot. She denies dysphagia or nocturnal regurgitation. She does not feel worse and does not have a globus sensation. She denies abdominal pain. Her bowel movements have been normal and she has had no bright red blood per rectum or melena. She denies use of non-steroidal anti-inflammatory drugs, bloody powder she does live in a group home and is accompanied by her caretaker.   Past Medical History  Diagnosis Date  . Osteoporosis   . COPD (chronic obstructive pulmonary disease) ([place])   . Tobacco abuse     ongoing  . DEPRESSION 05/31/2009  . Chronic mental illness   . Vitamin D deficiency   . Vitamin B 12 deficiency   . Anxiety   . Emphysema of lung ([place])   . Blood transfusion     Past Surgical History  Procedure Laterality Date  . Cholecystectomy    . Cervical cone biopsy    . Breast surgery  ?2015    lumpectomy, left  . Breast lumpectomy  1972    left breast   Family History  Problem Relation Age of Onset  . Coronary artery disease Mother   . Diabetes Mother   . Hypertension Mother   . Coronary artery disease Father   . Diabetes Father     Grandfather  . Hypertension Father   . Stroke  Father   . Other Father     colostomy bag  . Breast cancer Sister   . Cancer Other     niece--breast  . Diabetes Sister   . Hypertension Sister   . Mental illness Sister     x 4  . Esophageal cancer Neg Hx   . Stomach cancer Neg Hx   . Rectal cancer Neg Hx    Social History  Substance Use Topics  . Smoking status: Former Smoker    Types: Cigarettes    Quit date: 08/12/2011  . Smokeless tobacco: Never Used  . Alcohol Use: No   Current Outpatient Prescriptions  Medication Sig Dispense Refill  . alendronate (FOSAMAX) 70 MG tablet TAKE 1 TABLET BY MOUTH EVERY SEVEN   DAYS- TAKE WITH A FULL GLASS OF WATER ON AT NOON TIME   EMPTY STOMACH 4 tablet 6  . ALPRAZolam (XANAX) 0.25 MG tablet TAKE ONE TABLET   BY MOUTH   DAILY   AS NEEDED 30 tablet 0  . budesonide-formoterol (SYMBICORT) 160-4.5 MCG/ACT inhaler Inhale 2 puffs into the lungs 2 (two) times daily as needed. 1 Inhaler 3  . Calcium Carbonate-Vitamin D (CALTRATE 600+D) 600-400 MG-UNIT per tablet Take 1 tablet by mouth 2 (two) times daily.    . cholecalciferol (VITAMIN D) 1000 UNITS tablet Take 1,000 Units by mouth daily.    Marland Kitchen  citalopram (CELEXA) 20 MG tablet TAKE 1 TABLET BY MOUTH ONCE DAILY 30 tablet 5  . guaiFENesin (MUCINEX) 600 MG 12 hr tablet Take 1,200 mg by mouth 2 (two) times daily as needed for congestion.    Marland Kitchen ipratropium-albuterol (DUONEB) 0.5-2.5 (3) MG/3ML SOLN Take 3 mLs by nebulization 4 (four) times daily as needed. 360 mL 3  . loperamide (IMODIUM A-D) 2 MG tablet Take 1 tablet (2 mg total) by mouth as needed for diarrhea or loose stools. 30 tablet 1  . OLANZapine (ZYPREXA) 5 MG tablet TAKE 1 TABLET BY MOUTH DAILY AT BEDTIME 30 tablet 5  . ranitidine (ZANTAC) 150 MG tablet Take 1 tablet (150 mg total) by mouth 2 (two) times daily. 60 tablet 2  . omeprazole (PRILOSEC) 20 MG capsule Take 1 capsule (20 mg total) by mouth daily. Prior to breakfast 30 capsule 6   No current facility-administered medications for this visit.    Allergies  Allergen Reactions  . Sulfonamide Derivatives Hives     Review of Systems: Gen: Denies any fever, chills, sweats, anorexia, fatigue, weakness, malaise, weight loss, and sleep disorder CV: Denies chest pain, angina, palpitations, syncope, orthopnea, PND, peripheral edema, and claudication. Resp: Denies dyspnea at rest, dyspnea with exercise, cough, sputum, wheezing, coughing up blood, and pleurisy. GI: Denies vomiting blood, jaundice, and fecal incontinence.   Denies dysphagia or odynophagia. GU : Denies urinary burning, blood in urine, urinary frequency, urinary hesitancy, nocturnal urination, and urinary incontinence. MS: Denies joint pain, limitation of movement, and swelling, stiffness, low back pain, extremity pain. Denies muscle weakness, cramps, atrophy.  Derm: Denies rash, itching, dry skin, hives, moles, warts, or unhealing ulcers.  Psych: Denies depression, anxiety, memory loss, suicidal ideation, hallucinations, paranoia, and confusion. Heme: Denies bruising, bleeding, and enlarged lymph nodes. Neuro:  Denies any headaches, dizziness, paresthesia Endo:  Denies any problems with DM, thyroid, adrenal  LAB RESULTS: Blood work 02/05/2015 showed a hepatic function panel with total bili 1.2, direct bili 0.2, alkaline phosphatase 75, AST 14, ALT 12. Lipase 22 CBC 02/05/2015 WBC 6.7, hemoglobin 13.1, hematocrit 39.1, platelets 229,000.  Physical Exam: BP 110/80 mmHg  Pulse 72  Ht 5\' 2"  (1.575 m)  Wt 121 lb 3.2 oz (54.976 kg)  BMI 22.16 kg/m2 General: Pleasant, well developed , Caucasian female in no acute distress Head: Normocephalic and atraumatic Eyes:  sclerae anicteric, conjunctiva pink  Ears: Normal auditory acuity Lungs: Clear throughout to auscultation Heart: Regular rate and rhythm Abdomen: Soft, non distended, non-tender. No masses, no hepatomegaly. Normal bowel sounds Musculoskeletal: Symmetrical with no gross deformities  Extremities: No edema    Neurological: Alert oriented x 4, grossly nonfocal Psychological:  Alert and cooperative. Normal mood and affect  Assessment and Recommendations:  64 year old female with a 2 month history of nausea, vomiting, early satiety with associated weight loss here for evaluation. Patient will be given a trial of omeprazole 20 mg by mouth every morning 30 minutes prior to breakfast. She will be scheduled for an EGD to evaluate for esophagitis, gastritis, ulcer etc. A repeat hepatic function panel and lipase will be obtained. The EGD will be scheduled with Dr. Carlean Purl. Further recommendations will be made pending the findings of the above.       Aahana Elza, Vita Barley PA-C 02/28/2015,

## 2015-03-04 NOTE — Progress Notes (Signed)
Agree w/ Ms. Hvozdovic's note and mangement.  

## 2015-03-06 ENCOUNTER — Encounter: Payer: Self-pay | Admitting: Internal Medicine

## 2015-03-06 ENCOUNTER — Telehealth: Payer: Self-pay | Admitting: *Deleted

## 2015-03-06 ENCOUNTER — Ambulatory Visit (AMBULATORY_SURGERY_CENTER): Payer: Medicare Other | Admitting: Internal Medicine

## 2015-03-06 VITALS — BP 162/77 | HR 67 | Temp 98.0°F | Resp 28 | Ht 62.0 in | Wt 121.0 lb

## 2015-03-06 DIAGNOSIS — K222 Esophageal obstruction: Secondary | ICD-10-CM | POA: Diagnosis not present

## 2015-03-06 DIAGNOSIS — K219 Gastro-esophageal reflux disease without esophagitis: Secondary | ICD-10-CM

## 2015-03-06 DIAGNOSIS — J449 Chronic obstructive pulmonary disease, unspecified: Secondary | ICD-10-CM | POA: Diagnosis not present

## 2015-03-06 DIAGNOSIS — K208 Other esophagitis: Secondary | ICD-10-CM | POA: Diagnosis not present

## 2015-03-06 MED ORDER — SODIUM CHLORIDE 0.9 % IV SOLN: 500.0000 mL | INTRAVENOUS | Status: DC

## 2015-03-06 MED ORDER — DEXLANSOPRAZOLE 60 MG PO CPDR: 60.0000 mg | DELAYED_RELEASE_CAPSULE | Freq: Every day | ORAL | Status: DC

## 2015-03-06 MED ORDER — ALENDRONATE SODIUM 70 MG PO TABS: ORAL_TABLET | ORAL | Status: DC

## 2015-03-06 NOTE — Patient Instructions (Addendum)
There is a narrow area called a stricture in the esophagus. Likely from acid reflux and possibly alendronate (Fosamax).  I dilated the esophagus to help you swallow better but you will need another dilation this month or next.  1) STOP FOSAMAX  YOU HAD AN ENDOSCOPIC PROCEDURE TODAY AT [facility]:   Refer to the procedure report that was given to you for any specific questions about what was found during the examination.  If the procedure report does not answer your questions, please call your gastroenterologist to clarify.  If you requested that your care partner not be given the details of your procedure findings, then the procedure report has been included in a sealed envelope for you to review at your convenience later.  YOU SHOULD EXPECT: Some feelings of bloating in the abdomen. Passage of more gas than usual.  Walking can help get rid of the air that was put into your GI tract during the procedure and reduce the bloating. If you had a lower endoscopy (such as a colonoscopy or flexible sigmoidoscopy) you may notice spotting of blood in your stool or on the toilet paper. If you underwent a bowel prep for your procedure, you may not have a normal bowel movement for a few days.  Please Note:  You might notice some irritation and congestion in your nose or some drainage.  This is from the oxygen used during your procedure.  There is no need for concern and it should clear up in a day or so.  SYMPTOMS TO REPORT IMMEDIATELY:   Following upper endoscopy (EGD)  Vomiting of blood or coffee ground material  New chest pain or pain under the shoulder blades  Painful or persistently difficult swallowing  New shortness of breath  Fever of 100F or higher  Black, tarry-looking stools  For urgent or emergent issues, a gastroenterologist can be reached at any hour by calling [phone].   DIET: Follow dilation diet- see handout  ACTIVITY:  You should plan to take it  easy for the rest of today and you should NOT DRIVE or use heavy machinery until tomorrow (because of the sedation medicines used during the test).    FOLLOW UP: Our staff will call the number listed on your records the next business day following your procedure to check on you and address any questions or concerns that you may have regarding the information given to you following your procedure. If we do not reach you, we will leave a message.  However, if you are feeling well and you are not experiencing any problems, there is no need to return our call.  We will assume that you have returned to your regular daily activities without incident.  If any biopsies were taken you will be contacted by phone or by letter within the next 1-3 weeks.  Please call us at [phone] if you have not heard about the biopsies in 3 weeks.    SIGNATURES/CONFIDENTIALITY: You and/or your care partner have signed paperwork which will be entered into your electronic medical record.  These signatures attest to the fact that that the information above on your After Visit Summary has been reviewed and is understood.  Full responsibility of the confidentiality of this discharge information lies with you and/or your care-partner.  Stop Omeprazole and start taking Dexilant 60mg  1 tablet daily- take 30 minutes before first meal of the day- sent to your [facility]  Please read over information about dysphagia, dilation  diet and Dysphagia 3 diet  You will be contacted about your biopsies and to set up a repeat EGD

## 2015-03-06 NOTE — Telephone Encounter (Signed)
Left message for patient to call back  

## 2015-03-06 NOTE — Op Note (Signed)
[facility]  [address] [place], [address]   ENDOSCOPY PROCEDURE REPORT  PATIENT: Abigail, Castillo  MR#: [id number] BIRTHDATE: July 30, 1950 , 58  yrs. old GENDER: female ENDOSCOPIST: Gatha Mayer, MD, [facility] PROCEDURE DATE:  03/06/2015 PROCEDURE:  EGD w/ biopsy and EGD w/ balloon dilation ASA CLASS:     Class II INDICATIONS:  vomiting. MEDICATIONS: Propofol 160 mg IV and Monitored anesthesia care TOPICAL ANESTHETIC: none  DESCRIPTION OF PROCEDURE: After the risks benefits and alternatives of the procedure were thoroughly explained, informed consent was obtained.  The LB [id number] endoscope was introduced through the mouth and advanced to the second portion of the duodenum , Without limitations.  The instrument was slowly withdrawn as the mucosa was fully examined.    1) tight stricture distal esophagus - scope would not pass - dilated 10,11,12 mm with balloon and scope passed 2) Looks inflammatory - biopsies taken 3) Hiatal hernia 35-40 cm 4) Otherwise normal EGD.  Retroflexed views revealed a hiatal hernia. The scope was then withdrawn from the patient and the procedure completed.  COMPLICATIONS: There were no immediate complications.  ENDOSCOPIC IMPRESSION: 1) tight stricture distal esophagus - scope would not pass - dilated 10,11,12 mm with balloon and scope passed 2) Looks inflammatory - biopsies taken 3) Hiatal hernia 35-40 cm 4) Otherwise normal EGD  RECOMMENDATIONS: 1.  Clear liquids until 1 PM, then soft foods/Dysphagia 3 diet. 2.  Hold fosamax 3.  Continue PPI   but change ome[prazole to Dexilant 60 mg daily 4.  We will arrange repeat EGD and dilation in 2-4 weeks   eSigned:  Gatha Mayer, MD, [facility] 03/06/2015 11:51 AM  UP:[id number] Inda Castle, MD and The Patient

## 2015-03-06 NOTE — Progress Notes (Signed)
Called to room to assist during endoscopic procedure.  Patient ID and intended procedure confirmed with present staff. Received instructions for my participation in the procedure from the performing physician.  

## 2015-03-06 NOTE — Telephone Encounter (Signed)
[name] and PJ  Dr. Carlean Purl wanted this pt to have a repeat EGD in 2-4- weeks.  He doesn't have any openings up here until December, so I made him aware of that.  He states he is going to wait until the biopsies come back and then have the appointment made. I didn't want you to think I had forgotten to schedule it.  Thanks, [employer]

## 2015-03-06 NOTE — Progress Notes (Signed)
Transferred to recovery room. A/O x3, pleased with MAC.  VSS.  Report to Kristen, RN. 

## 2015-03-07 ENCOUNTER — Telehealth: Payer: Self-pay

## 2015-03-07 ENCOUNTER — Ambulatory Visit (INDEPENDENT_AMBULATORY_CARE_PROVIDER_SITE_OTHER): Payer: Medicare Other

## 2015-03-07 DIAGNOSIS — E538 Deficiency of other specified B group vitamins: Secondary | ICD-10-CM | POA: Diagnosis not present

## 2015-03-07 MED ORDER — CYANOCOBALAMIN 1000 MCG/ML IJ SOLN
1000.0000 ug | Freq: Once | INTRAMUSCULAR | Status: AC
  Administered 2015-03-07: 1000 ug via INTRAMUSCULAR

## 2015-03-07 NOTE — Progress Notes (Signed)
Pre visit review using our clinic review tool, if applicable. No additional management support is needed unless otherwise documented below in the visit note.  Patient in for B12 injection. Given Left deltoid IM. Patient tolerated well.

## 2015-03-07 NOTE — Telephone Encounter (Signed)
  Follow up Call-  Call back number 03/06/2015  Post procedure Call Back phone  # [phone]  Permission to leave phone message No     Patient questions:  Do you have a fever, pain , or abdominal swelling? No. Pain Score  0 *  Have you tolerated food without any problems? Yes.    Have you been able to return to your normal activities? Yes.    Do you have any questions about your discharge instructions: Diet   No. Medications  No. Follow up visit  No.  Do you have questions or concerns about your Care? No.  Actions: * If pain score is 4 or above: No action needed, pain <4.

## 2015-03-07 NOTE — Telephone Encounter (Signed)
Patient is scheduled for EGD on 03/28/15 7:30 and pre-visit 03/13/15 9:00.  Both appts scheduled with her daughter

## 2015-03-13 ENCOUNTER — Ambulatory Visit (AMBULATORY_SURGERY_CENTER): Payer: Self-pay

## 2015-03-13 VITALS — Ht 62.0 in | Wt 123.4 lb

## 2015-03-13 DIAGNOSIS — R1310 Dysphagia, unspecified: Secondary | ICD-10-CM

## 2015-03-13 NOTE — Progress Notes (Signed)
No allergies to eggs or soy No diet/weight loss meds No home oxygen No past problems with anesthesia  Refused emmi; no internet

## 2015-03-14 ENCOUNTER — Encounter: Payer: Self-pay | Admitting: Internal Medicine

## 2015-03-14 DIAGNOSIS — K222 Esophageal obstruction: Secondary | ICD-10-CM

## 2015-03-14 HISTORY — DX: Esophageal obstruction: K22.2

## 2015-03-14 NOTE — Progress Notes (Signed)
Quick Note:  Inflammation' Repeat dilation 11/2 ______

## 2015-03-28 ENCOUNTER — Encounter: Payer: Self-pay | Admitting: Internal Medicine

## 2015-03-28 ENCOUNTER — Telehealth: Payer: Self-pay

## 2015-03-28 ENCOUNTER — Ambulatory Visit (AMBULATORY_SURGERY_CENTER): Payer: Medicare Other | Admitting: Internal Medicine

## 2015-03-28 VITALS — BP 111/74 | HR 61 | Temp 96.0°F | Resp 9 | Ht 62.0 in | Wt 123.0 lb

## 2015-03-28 DIAGNOSIS — K222 Esophageal obstruction: Secondary | ICD-10-CM | POA: Diagnosis not present

## 2015-03-28 DIAGNOSIS — K449 Diaphragmatic hernia without obstruction or gangrene: Secondary | ICD-10-CM | POA: Diagnosis not present

## 2015-03-28 DIAGNOSIS — F329 Major depressive disorder, single episode, unspecified: Secondary | ICD-10-CM | POA: Diagnosis not present

## 2015-03-28 DIAGNOSIS — J449 Chronic obstructive pulmonary disease, unspecified: Secondary | ICD-10-CM | POA: Diagnosis not present

## 2015-03-28 DIAGNOSIS — R1310 Dysphagia, unspecified: Secondary | ICD-10-CM

## 2015-03-28 DIAGNOSIS — F259 Schizoaffective disorder, unspecified: Secondary | ICD-10-CM | POA: Diagnosis not present

## 2015-03-28 MED ORDER — SODIUM CHLORIDE 0.9 % IV SOLN: 500.0000 mL | INTRAVENOUS | Status: DC

## 2015-03-28 NOTE — Progress Notes (Signed)
Called to room to assist during endoscopic procedure.  Patient ID and intended procedure confirmed with present staff. Received instructions for my participation in the procedure from the performing physician.  

## 2015-03-28 NOTE — Progress Notes (Signed)
Report to PACU, RN, vss, BBS= Clear.  

## 2015-03-28 NOTE — Progress Notes (Signed)
No problems noted in the recovery room. maw 

## 2015-03-28 NOTE — Op Note (Addendum)
[facility]  [address]. [place], [address]   ENDOSCOPY PROCEDURE REPORT  PATIENT: Abigail, Castillo  MR#: [id number] BIRTHDATE: [birth year] , 64  yrs. old GENDER: female ENDOSCOPIST: Gatha Mayer, MD, [facility] PROCEDURE DATE:  03/28/2015 PROCEDURE:  EGD w/ balloon dilation ASA CLASS:     Class II INDICATIONS:  dysphagia. MEDICATIONS: Propofol 200 mg IV and Monitored anesthesia care TOPICAL ANESTHETIC: none  DESCRIPTION OF PROCEDURE: After the risks benefits and alternatives of the procedure were thoroughly explained, informed consent was obtained.  The LB [id number] endoscope was introduced through the mouth and advanced to the second portion of the duodenum , Without limitations.  The instrument was slowly withdrawn as the mucosa was fully examined.    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  Retroflexed views revealed a hiatal hernia.     The scope was then withdrawn from the patient and the procedure completed.  COMPLICATIONS: There were no immediate complications.  ENDOSCOPIC IMPRESSION: 1) Stricture at GE junction with ulceration c/w GERD or alendronate stricture - dilate 12 and 13.5 mm allowing scope to pass 2) Moderate hiatal hernia - otherwise NL  RECOMMENDATIONS: Continue PPI - Dexilant, stay off alendronate for now - believe was on before we will arrange a repeat EGD/dili for December most likely Clears until 0900, then soft then try regular foods but cut small and chew well (Dysphagia 3)    eSigned:  Gatha Mayer, MD, [facility] 03/28/2015 8:14 AM Revised: 03/28/2015 8:14 AM   CC:The Patient      [name], NP

## 2015-03-28 NOTE — Patient Instructions (Signed)
YOU HAD AN ENDOSCOPIC PROCEDURE TODAY AT [facility]:   Refer to the procedure report that was given to you for any specific questions about what was found during the examination.  If the procedure report does not answer your questions, please call your gastroenterologist to clarify.  If you requested that your care partner not be given the details of your procedure findings, then the procedure report has been included in a sealed envelope for you to review at your convenience later.  YOU SHOULD EXPECT: Some feelings of bloating in the abdomen. Passage of more gas than usual.  Walking can help get rid of the air that was put into your GI tract during the procedure and reduce the bloating. If you had a lower endoscopy (such as a colonoscopy or flexible sigmoidoscopy) you may notice spotting of blood in your stool or on the toilet paper. If you underwent a bowel prep for your procedure, you may not have a normal bowel movement for a few days.  Please Note:  You might notice some irritation and congestion in your nose or some drainage.  This is from the oxygen used during your procedure.  There is no need for concern and it should clear up in a day or so.  SYMPTOMS TO REPORT IMMEDIATELY:    Following upper endoscopy (EGD)  Vomiting of blood or coffee ground material  New chest pain or pain under the shoulder blades  Painful or persistently difficult swallowing  New shortness of breath  Fever of 100F or higher  Black, tarry-looking stools  For urgent or emergent issues, a gastroenterologist can be reached at any hour by calling [phone].   DIET: Please follow the dilatation diet the rest of the day.  Handout given to your care partner.  Drink plenty of fluids but you should avoid alcoholic beverages for 24 hours.  ACTIVITY:  You should plan to take it easy for the rest of today and you should NOT DRIVE or use heavy machinery until tomorrow (because of the sedation medicines  used during the test).    FOLLOW UP: Our staff will call the number listed on your records the next business day following your procedure to check on you and address any questions or concerns that you may have regarding the information given to you following your procedure. If we do not reach you, we will leave a message.  However, if you are feeling well and you are not experiencing any problems, there is no need to return our call.  We will assume that you have returned to your regular daily activities without incident.  If any biopsies were taken you will be contacted by phone or by letter within the next 1-3 weeks.  Please call us at [phone] if you have not heard about the biopsies in 3 weeks.    SIGNATURES/CONFIDENTIALITY: You and/or your care partner have signed paperwork which will be entered into your electronic medical record.  These signatures attest to the fact that that the information above on your After Visit Summary has been reviewed and is understood.  Full responsibility of the confidentiality of this discharge information lies with you and/or your care-partner.    Handouts were given to your care partner on esophageal stricture, hiatal hernia and post esophageal stricture diet that was printed ou by Dr. Carlean Purl. ( Dysphagia diet level 3). [name], Dr. Celesta Aver nurse in the office will contact you to set up appointment for EGD with dilatation. You may  resume your current medications today. Please call if any questions or concerns.

## 2015-03-28 NOTE — Telephone Encounter (Signed)
Daughter given information about appt for 05/03/15 and 04/16/15. For EGD and pre-visit

## 2015-03-28 NOTE — Telephone Encounter (Signed)
-----   Message from Gatha Mayer, MD sent at 03/28/2015  8:40 AM EDT ----- Regarding: dec slot Please find a late Nov or a dec 0730 slot to do EGD/dilatiion in [place]  thanks

## 2015-03-29 ENCOUNTER — Telehealth: Payer: Self-pay | Admitting: *Deleted

## 2015-03-29 NOTE — Telephone Encounter (Signed)
  Follow up Call-  Call back number 03/28/2015 03/06/2015  Post procedure Call Back phone  # [phone]  Permission to leave phone message Yes No     Patient questions:  Do you have a fever, pain , or abdominal swelling? No. Pain Score  0 *  Have you tolerated food without any problems? Yes.    Have you been able to return to your normal activities? Yes.    Do you have any questions about your discharge instructions: Diet   No. Medications  No. Follow up visit  No.  Do you have questions or concerns about your Care? No.  Actions: * If pain score is 4 or above: No action needed, pain <4.

## 2015-04-04 ENCOUNTER — Ambulatory Visit (INDEPENDENT_AMBULATORY_CARE_PROVIDER_SITE_OTHER): Payer: Medicare Other

## 2015-04-04 DIAGNOSIS — E538 Deficiency of other specified B group vitamins: Secondary | ICD-10-CM | POA: Diagnosis not present

## 2015-04-04 MED ORDER — CYANOCOBALAMIN 1000 MCG/ML IJ SOLN
1000.0000 ug | Freq: Once | INTRAMUSCULAR | Status: AC
  Administered 2015-04-04: 1000 ug via INTRAMUSCULAR

## 2015-04-04 NOTE — Progress Notes (Signed)
Pre visit review using our clinic review tool, if applicable. No additional management support is needed unless otherwise documented below in the visit note.  Patient in for B12 injection. Given IM Right deltoid. Patient tolerated well.

## 2015-04-16 ENCOUNTER — Ambulatory Visit (AMBULATORY_SURGERY_CENTER): Payer: Self-pay | Admitting: *Deleted

## 2015-04-16 VITALS — Ht 62.0 in | Wt 127.0 lb

## 2015-04-16 DIAGNOSIS — R1310 Dysphagia, unspecified: Secondary | ICD-10-CM

## 2015-04-16 NOTE — Progress Notes (Signed)
No egg or soy allergy No issues with past sedation No diet pills No home 02 use   With daughter in [place] today

## 2015-04-18 ENCOUNTER — Other Ambulatory Visit: Payer: Self-pay | Admitting: Family

## 2015-05-03 ENCOUNTER — Ambulatory Visit (AMBULATORY_SURGERY_CENTER): Payer: Medicare Other | Admitting: Internal Medicine

## 2015-05-03 ENCOUNTER — Encounter: Payer: Self-pay | Admitting: Internal Medicine

## 2015-05-03 VITALS — BP 126/90 | HR 86 | Temp 97.8°F | Resp 26 | Ht 62.0 in | Wt 127.0 lb

## 2015-05-03 DIAGNOSIS — K222 Esophageal obstruction: Secondary | ICD-10-CM

## 2015-05-03 DIAGNOSIS — R1310 Dysphagia, unspecified: Secondary | ICD-10-CM | POA: Diagnosis not present

## 2015-05-03 DIAGNOSIS — F341 Dysthymic disorder: Secondary | ICD-10-CM | POA: Diagnosis not present

## 2015-05-03 DIAGNOSIS — J449 Chronic obstructive pulmonary disease, unspecified: Secondary | ICD-10-CM | POA: Diagnosis not present

## 2015-05-03 DIAGNOSIS — F259 Schizoaffective disorder, unspecified: Secondary | ICD-10-CM | POA: Diagnosis not present

## 2015-05-03 MED ORDER — SODIUM CHLORIDE 0.9 % IV SOLN: 500.0000 mL | INTRAVENOUS | Status: DC

## 2015-05-03 NOTE — Progress Notes (Signed)
Called to room to assist during endoscopic procedure.  Patient ID and intended procedure confirmed with present staff. Received instructions for my participation in the procedure from the performing physician.  

## 2015-05-03 NOTE — Progress Notes (Signed)
No problems noted in the recovery room. maw 

## 2015-05-03 NOTE — Patient Instructions (Addendum)
Glad to hear the swallowing is better - I stretched it some more today and will see you as needed.  You will need to see [name] again to get back on treatment for osteoporosis.  I appreciate the opportunity to care for you. Abigail Mayer, MD, FACG       YOU HAD AN ENDOSCOPIC PROCEDURE TODAY AT [facility]:   Refer to the procedure report that was given to you for any specific questions about what was found during the examination.  If the procedure report does not answer your questions, please call your gastroenterologist to clarify.  If you requested that your care partner not be given the details of your procedure findings, then the procedure report has been included in a sealed envelope for you to review at your convenience later.  YOU SHOULD EXPECT: Some feelings of bloating in the abdomen. Passage of more gas than usual.  Walking can help get rid of the air that was put into your GI tract during the procedure and reduce the bloating. If you had a lower endoscopy (such as a colonoscopy or flexible sigmoidoscopy) you may notice spotting of blood in your stool or on the toilet paper. If you underwent a bowel prep for your procedure, you may not have a normal bowel movement for a few days.  Please Note:  You might notice some irritation and congestion in your nose or some drainage.  This is from the oxygen used during your procedure.  There is no need for concern and it should clear up in a day or so.  SYMPTOMS TO REPORT IMMEDIATELY:    Following upper endoscopy (EGD)  Vomiting of blood or coffee ground material  New chest pain or pain under the shoulder blades  Painful or persistently difficult swallowing  New shortness of breath  Fever of 100F or higher  Black, tarry-looking stools  For urgent or emergent issues, a gastroenterologist can be reached at any hour by calling [phone].   DIET:   Drink plenty of fluids but you should avoid  alcoholic beverages for 24 hours.  Please follow the dilatation diet the rest of today.  Handout given to your care partner.  ACTIVITY:  You should plan to take it easy for the rest of today and you should NOT DRIVE or use heavy machinery until tomorrow (because of the sedation medicines used during the test).    FOLLOW UP: Our staff will call the number listed on your records the next business day following your procedure to check on you and address any questions or concerns that you may have regarding the information given to you following your procedure. If we do not reach you, we will leave a message.  However, if you are feeling well and you are not experiencing any problems, there is no need to return our call.  We will assume that you have returned to your regular daily activities without incident.  If any biopsies were taken you will be contacted by phone or by letter within the next 1-3 weeks.  Please call us at [phone] if you have not heard about the biopsies in 3 weeks.    SIGNATURES/CONFIDENTIALITY: You and/or your care partner have signed paperwork which will be entered into your electronic medical record.  These signatures attest to the fact that that the information above on your After Visit Summary has been reviewed and is understood.  Full responsibility of the confidentiality of this discharge information  lies with you and/or your care-partner.    Handouts were given to your care partner on the dilatation diet to follow the rest of the day, GERD with hiatal hernia. Stay off oral bisphosphonates - see PCP re; other treatments for osteoporosis.  Per dr. Carlean Purl stay on dexilant for GERD. You may resume your other current medications today. Please call if any questions or concerns.

## 2015-05-03 NOTE — Op Note (Addendum)
[place]  [address]. [place], [id number]   ENDOSCOPY PROCEDURE REPORT  PATIENT: Abigail, Castillo  MR#: [id number] BIRTHDATE: 01-Sep-1950 , 64  yrs. old GENDER: female ENDOSCOPIST: Gatha Mayer, MD, [facility] PROCEDURE DATE:  05/03/2015 PROCEDURE:  Esophagoscopy   + balloon dilation < 30 mm ASA CLASS:     Class II INDICATIONS:  therapeutic procedure and dysphagia. MEDICATIONS: Propofol 200 mg IV and Monitored anesthesia care TOPICAL ANESTHETIC: none  DESCRIPTION OF PROCEDURE: After the risks benefits and alternatives of the procedure were thoroughly explained, informed consent was obtained.  The LB [id number] endoscope was introduced through the mouth and advanced to the stomach antrum , Without limitations.  The instrument was slowly withdrawn as the mucosa was fully examined.    1) Distal esophageal stricture above small sliding hiatal hernia. 2) otherwise normal exam to antrum 3) 16 mm balloon dilation of stricture x 60 sec w/ good result - was dilated to 12 mm 10/11 and 13.5 mm 11/2.  Retroflexed views revealed a hiatal hernia.     The scope was then withdrawn from the patient and the procedure completed.  COMPLICATIONS: There were no immediate complications.  ENDOSCOPIC IMPRESSION: 1) Distal esophageal stricture above small sliding hiatal hernia. 2) otherwise normal exam to antrum 3) 16 mm balloon dilation of stricture x 60 sec w/ good result - was dilated to 12 mm 10/11 and 13.5 mm 11/2  RECOMMENDATIONS: 1.  Clear liquids until 0900, then soft foods rest of day.  Resume prior diet tomorrow. 2.  Stay off oral bisphosphonates - see PCP re: other Tx osteoporosis 3.  See me prn - stay on PPI   eSigned:  Gatha Mayer, MD, [facility] 05/03/2015 8:01 AM Revised: 05/03/2015 8:01 AM   [id number] [name] NP and The Patient
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
## 2015-05-03 NOTE — Progress Notes (Signed)
Report to PACU, RN, vss, BBS= Clear.  

## 2015-05-04 ENCOUNTER — Telehealth: Payer: Self-pay | Admitting: *Deleted

## 2015-05-04 ENCOUNTER — Ambulatory Visit (INDEPENDENT_AMBULATORY_CARE_PROVIDER_SITE_OTHER): Payer: Medicare Other | Admitting: Behavioral Health

## 2015-05-04 DIAGNOSIS — E538 Deficiency of other specified B group vitamins: Secondary | ICD-10-CM

## 2015-05-04 MED ORDER — CYANOCOBALAMIN 1000 MCG/ML IJ SOLN
1000.0000 ug | Freq: Once | INTRAMUSCULAR | Status: AC
  Administered 2015-05-04: 1000 ug via INTRAMUSCULAR

## 2015-05-04 NOTE — Progress Notes (Signed)
Pre visit review using our clinic review tool, if applicable. No additional management support is needed unless otherwise documented below in the visit note.  Patient tolerated injection well. Next appointment scheduled for 06/06/15 at 9:30 AM.

## 2015-05-04 NOTE — Telephone Encounter (Signed)
  Follow up Call-  Call back number 05/03/2015 03/28/2015 03/06/2015  Post procedure Call Back phone  # [phone]  Permission to leave phone message Yes Yes No     Patient questions:  Do you have a fever, pain , or abdominal swelling? No. Pain Score  0 *  Have you tolerated food without any problems? Yes.    Have you been able to return to your normal activities? Yes.    Do you have any questions about your discharge instructions: Diet   No. Medications  No. Follow up visit  No.  Do you have questions or concerns about your Care? Yes.    Actions: * If pain score is 4 or above: No action needed, pain <4.

## 2015-05-27 HISTORY — PX: BREAST EXCISIONAL BIOPSY: SUR124

## 2015-06-06 ENCOUNTER — Ambulatory Visit: Payer: Medicare Other

## 2015-06-08 ENCOUNTER — Ambulatory Visit (INDEPENDENT_AMBULATORY_CARE_PROVIDER_SITE_OTHER): Payer: Medicare Other

## 2015-06-08 DIAGNOSIS — E538 Deficiency of other specified B group vitamins: Secondary | ICD-10-CM

## 2015-06-08 MED ORDER — CYANOCOBALAMIN 1000 MCG/ML IJ SOLN
1000.0000 ug | Freq: Once | INTRAMUSCULAR | Status: AC
  Administered 2015-06-08: 1000 ug via INTRAMUSCULAR

## 2015-06-08 NOTE — Progress Notes (Signed)
Pre visit review using our clinic review tool, if applicable. No additional management support is needed unless otherwise documented below in the visit note.  Pt tolerated injection well.  No signs of a reaction upon leaving the clinic.  Next injection scheduled for Friday, 07/13/15.

## 2015-06-14 ENCOUNTER — Other Ambulatory Visit: Payer: Self-pay | Admitting: Family

## 2015-06-15 ENCOUNTER — Ambulatory Visit (INDEPENDENT_AMBULATORY_CARE_PROVIDER_SITE_OTHER): Payer: Medicare Other | Admitting: Family

## 2015-06-15 ENCOUNTER — Encounter: Payer: Self-pay | Admitting: Family

## 2015-06-15 VITALS — BP 119/73 | HR 73 | Temp 98.0°F | Resp 16 | Ht 62.0 in | Wt 135.2 lb

## 2015-06-15 DIAGNOSIS — F329 Major depressive disorder, single episode, unspecified: Secondary | ICD-10-CM

## 2015-06-15 DIAGNOSIS — E559 Vitamin D deficiency, unspecified: Secondary | ICD-10-CM

## 2015-06-15 DIAGNOSIS — E785 Hyperlipidemia, unspecified: Secondary | ICD-10-CM

## 2015-06-15 DIAGNOSIS — E538 Deficiency of other specified B group vitamins: Secondary | ICD-10-CM | POA: Diagnosis not present

## 2015-06-15 DIAGNOSIS — M810 Age-related osteoporosis without current pathological fracture: Secondary | ICD-10-CM

## 2015-06-15 DIAGNOSIS — F32A Depression, unspecified: Secondary | ICD-10-CM

## 2015-06-15 MED ORDER — BUDESONIDE-FORMOTEROL FUMARATE 160-4.5 MCG/ACT IN AERO: 2.0000 | INHALATION_SPRAY | Freq: Two times a day (BID) | RESPIRATORY_TRACT | Status: DC | PRN

## 2015-06-15 NOTE — Progress Notes (Signed)
Pre visit review using our clinic review tool, if applicable. No additional management support is needed unless otherwise documented below in the visit note. 

## 2015-06-15 NOTE — Progress Notes (Signed)
Subjective:    Patient ID: Abigail Castillo, female    DOB: 12-Sep-1950, 65 y.o.   MRN: [id number]  HPI  Abigail Castillo is a 65 yr old female who presents today for follow up.   1) Hyperlipidemia- diet controlled Lab Results  Component Value Date   CHOL 177 02/05/2015   HDL 41.40 02/05/2015   LDLCALC 110* 02/05/2015   TRIG 130.0 02/05/2015   CHOLHDL 4 02/05/2015   2) Depression- on citalopram, zyprexa- managed by psychiatry. Reports mood is good.    3) Vit D deficiency- continues vit d supplement  4) B12 def-pt continues monthly b12 injections  S/p dilation of esophgeal stricture.  Oral bisphosphonate d/cd Review of Systems    see HPI  Past Medical History  Diagnosis Date  . Osteoporosis   . COPD (chronic obstructive pulmonary disease) ([place])   . Tobacco abuse     ongoing  . DEPRESSION 05/31/2009  . Chronic mental illness   . Vitamin D deficiency   . Vitamin B 12 deficiency   . Anxiety   . Emphysema of lung ([place])   . Blood transfusion   . Esophageal stricture 03/14/2015  . Blood transfusion without reported diagnosis     Social History   Social History  . Marital Status: Divorced    Spouse Name: N/A  . Number of Children: 3  . Years of Education: N/A   Occupational History  . disabled    Social History Main Topics  . Smoking status: Former Smoker    Types: Cigarettes    Quit date: 08/12/2011  . Smokeless tobacco: Never Used  . Alcohol Use: No  . Drug Use: No  . Sexual Activity: Not on file   Other Topics Concern  . Not on file   Social History Narrative    Past Surgical History  Procedure Laterality Date  . Cholecystectomy    . Cervical cone biopsy    . Breast surgery  ?2015    lumpectomy, left  . Breast lumpectomy  1972    left breast  . Colonoscopy    . Esophagogastroduodenoscopy (egd) with esophageal dilation    . Upper gastrointestinal endoscopy      Family History  Problem Relation Age of Onset  . Coronary artery disease Mother   .  Diabetes Mother   . Hypertension Mother   . Coronary artery disease Father   . Diabetes Father     Grandfather  . Hypertension Father   . Stroke Father   . Other Father     colostomy bag  . Breast cancer Sister   . Cancer Other     niece--breast  . Diabetes Sister   . Hypertension Sister   . Mental illness Sister     x 4  . Esophageal cancer Neg Hx   . Stomach cancer Neg Hx   . Rectal cancer Neg Hx   . Colon cancer Neg Hx   . Colon polyps Neg Hx     Allergies  Allergen Reactions  . Sulfonamide Derivatives Hives    Current Outpatient Prescriptions on File Prior to Visit  Medication Sig Dispense Refill  . ALPRAZolam (XANAX) 0.25 MG tablet TAKE ONE TABLET   BY MOUTH   DAILY   AS NEEDED 30 tablet 0  . budesonide-formoterol (SYMBICORT) 160-4.5 MCG/ACT inhaler Inhale 2 puffs into the lungs 2 (two) times daily as needed. 1 Inhaler 3  . Calcium Carbonate-Vitamin D (CALTRATE 600+D) 600-400 MG-UNIT per tablet Take 1 tablet by mouth  2 (two) times daily.    . cholecalciferol (VITAMIN D) 1000 UNITS tablet Take 1,000 Units by mouth daily.    . citalopram (CELEXA) 20 MG tablet TAKE 1 TABLET BY MOUTH ONCE DAILY 30 tablet 5  . dexlansoprazole (DEXILANT) 60 MG capsule Take 60 mg by mouth daily.    Marland Kitchen guaiFENesin (MUCINEX) 600 MG 12 hr tablet Take 1,200 mg by mouth 2 (two) times daily as needed for congestion.    Marland Kitchen ipratropium-albuterol (DUONEB) 0.5-2.5 (3) MG/3ML SOLN Take 3 mLs by nebulization 4 (four) times daily as needed. 360 mL 3  . loperamide (IMODIUM) 2 MG capsule TAKE 1 CAPSULE   (2 MG TOTAL) BY MOUTH AS NEEDED FOR DIARRHEA OR LOOSE STOOLS. 30 capsule 1  . OLANZapine (ZYPREXA) 5 MG tablet TAKE 1 TABLET BY MOUTH DAILY AT BEDTIME 30 tablet 5  . PRESCRIPTION MEDICATION B12 inj once monthly     No current facility-administered medications on file prior to visit.    BP 119/73 mmHg  Pulse 73  Temp(Src) 98 F (36.7 C) (Oral)  Resp 16  Ht 5\' 2"  (1.575 m)  Wt 135 lb 3.2 oz (61.326 kg)   BMI 24.72 kg/m2  SpO2 97%    Objective:   Physical Exam  Constitutional: She appears well-developed and well-nourished.  Cardiovascular: Normal rate, regular rhythm and normal heart sounds.   No murmur heard. Pulmonary/Chest: Effort normal and breath sounds normal. No respiratory distress. She has no wheezes.  Psychiatric: She has a normal mood and affect. Her behavior is normal. Judgment and thought content normal.          Assessment & Plan:

## 2015-06-15 NOTE — Patient Instructions (Addendum)
Please complete lab work prior to leaving. Complete monthly b12 injections.  Please schedule medicare wellness visit at the front desk.

## 2015-06-16 LAB — VITAMIN D 25 HYDROXY (VIT D DEFICIENCY, FRACTURES): Vit D, 25-Hydroxy: 64 ng/mL (ref 30–100)

## 2015-06-17 ENCOUNTER — Encounter: Payer: Self-pay | Admitting: Family

## 2015-06-18 ENCOUNTER — Telehealth: Payer: Self-pay | Admitting: Family

## 2015-06-18 NOTE — Assessment & Plan Note (Signed)
Stable, continue low fat/low cholesterol diet.

## 2015-06-18 NOTE — Assessment & Plan Note (Signed)
Fosamax was d/cd by GI due to esophageal stricture. Will initiate Reclast infusion.

## 2015-06-18 NOTE — Telephone Encounter (Signed)
Yes please, reclast.

## 2015-06-18 NOTE — Assessment & Plan Note (Signed)
Stable on vit D supplement, continue same.

## 2015-06-18 NOTE — Assessment & Plan Note (Signed)
Stable on current meds,  Management per psychiatry.

## 2015-06-18 NOTE — Telephone Encounter (Signed)
Just wanted to verify-- reclast?  Not Prolia?

## 2015-06-18 NOTE — Telephone Encounter (Signed)
Could you please initiate reclast for Abigail Castillo?

## 2015-06-18 NOTE — Assessment & Plan Note (Signed)
Continue monthly b12 injections.  

## 2015-07-04 DIAGNOSIS — F259 Schizoaffective disorder, unspecified: Secondary | ICD-10-CM | POA: Diagnosis not present

## 2015-07-09 NOTE — Telephone Encounter (Signed)
Abigail Castillo-- can you see if authorization is required for pt to receive Reclast. DX M81.0. She has not been scheduled yet and this will be her first one. She is unable to tolerate oral meds due to esophageal stricture.  Thanks!

## 2015-07-10 NOTE — Telephone Encounter (Signed)
I do not do PA for meds.

## 2015-07-10 NOTE — Telephone Encounter (Signed)
Delsa Sale-- I called Medicaid and they will not let me do authorization over the phone and told me I need to have access to their portal. I do not have access to that. Marj thought you might have access. I have CPT code for the procedure. Can we get together and submit the information through your portal?

## 2015-07-11 NOTE — Telephone Encounter (Signed)
I do but its only for radiology procedures.

## 2015-07-13 ENCOUNTER — Ambulatory Visit: Payer: Medicare Other

## 2015-07-13 ENCOUNTER — Telehealth: Payer: Self-pay | Admitting: Family

## 2015-07-13 ENCOUNTER — Ambulatory Visit (INDEPENDENT_AMBULATORY_CARE_PROVIDER_SITE_OTHER): Payer: Medicare Other | Admitting: Family

## 2015-07-13 ENCOUNTER — Encounter: Payer: Self-pay | Admitting: Family

## 2015-07-13 VITALS — BP 134/86 | HR 78 | Temp 98.2°F | Resp 16 | Ht 62.0 in | Wt 137.6 lb

## 2015-07-13 DIAGNOSIS — Z0000 Encounter for general adult medical examination without abnormal findings: Secondary | ICD-10-CM

## 2015-07-13 DIAGNOSIS — E538 Deficiency of other specified B group vitamins: Secondary | ICD-10-CM | POA: Diagnosis not present

## 2015-07-13 DIAGNOSIS — Z1231 Encounter for screening mammogram for malignant neoplasm of breast: Secondary | ICD-10-CM | POA: Diagnosis not present

## 2015-07-13 DIAGNOSIS — R002 Palpitations: Secondary | ICD-10-CM | POA: Diagnosis not present

## 2015-07-13 DIAGNOSIS — Z1239 Encounter for other screening for malignant neoplasm of breast: Secondary | ICD-10-CM

## 2015-07-13 LAB — CBC WITH DIFFERENTIAL/PLATELET
BASOS ABS: 0 10*3/uL (ref 0.0–0.1)
Basophils Relative: 0.4 % (ref 0.0–3.0)
EOS ABS: 0.1 10*3/uL (ref 0.0–0.7)
Eosinophils Relative: 2.1 % (ref 0.0–5.0)
HEMATOCRIT: 35.7 % — AB (ref 36.0–46.0)
HEMOGLOBIN: 11.9 g/dL — AB (ref 12.0–15.0)
LYMPHS PCT: 20 % (ref 12.0–46.0)
Lymphs Abs: 1.4 10*3/uL (ref 0.7–4.0)
MCHC: 33.4 g/dL (ref 30.0–36.0)
MCV: 93.3 fl (ref 78.0–100.0)
MONO ABS: 0.5 10*3/uL (ref 0.1–1.0)
Monocytes Relative: 6.6 % (ref 3.0–12.0)
Neutro Abs: 4.9 10*3/uL (ref 1.4–7.7)
Neutrophils Relative %: 70.9 % (ref 43.0–77.0)
Platelets: 224 10*3/uL (ref 150.0–400.0)
RBC: 3.83 Mil/uL — AB (ref 3.87–5.11)
RDW: 13.3 % (ref 11.5–15.5)
WBC: 6.9 10*3/uL (ref 4.0–10.5)

## 2015-07-13 LAB — BASIC METABOLIC PANEL
BUN: 8 mg/dL (ref 6–23)
CALCIUM: 9 mg/dL (ref 8.4–10.5)
CHLORIDE: 105 meq/L (ref 96–112)
CO2: 30 meq/L (ref 19–32)
Creatinine, Ser: 0.75 mg/dL (ref 0.40–1.20)
GFR: 82.87 mL/min (ref >60.00)
GLUCOSE: 104 mg/dL — AB (ref 70–99)
POTASSIUM: 3.9 meq/L (ref 3.5–5.1)
SODIUM: 144 meq/L (ref 135–145)

## 2015-07-13 LAB — TSH: TSH: 0.49 u[IU]/mL (ref 0.35–4.50)

## 2015-07-13 MED ORDER — ALBUTEROL SULFATE HFA 108 (90 BASE) MCG/ACT IN AERS: 2.0000 | INHALATION_SPRAY | Freq: Four times a day (QID) | RESPIRATORY_TRACT | Status: DC | PRN

## 2015-07-13 MED ORDER — CYANOCOBALAMIN 1000 MCG/ML IJ SOLN
1000.0000 ug | Freq: Once | INTRAMUSCULAR | Status: AC
  Administered 2015-07-13: 1000 ug via INTRAMUSCULAR

## 2015-07-13 NOTE — Progress Notes (Signed)
Subjective:    Abigail Castillo is a 65 y.o. female who presents for Medicare Annual/Subsequent preventive examination.  Preventive Screening-Counseling & Management  Tobacco History  Smoking status  . Former Smoker  . Types: Cigarettes  . Quit date: 08/12/2011  Smokeless tobacco  . Never Used     Immunizations:tetanus up to date Diet: healthy diet Exercise: walk Colonoscopy: 9/13 Dexa: 9/15 Pap Smear: hysterectomy Mammogram:1/15   Problems Prior to Visit 1. b12 deficiency- on monthly injections  2.  Depression- managed by psych, reports well controlled.    3.  COPD- stable. Notes occasional SOB.  Uses inhalers which helps.  Does not have albuterol MDI.    Current Problems (verified) Patient Active Problem List   Diagnosis Date Noted  . Esophageal stricture 03/14/2015  . COPD (chronic obstructive pulmonary disease) ([place]) 08/05/2014  . Mild hyperlipidemia 02/03/2014  . Vitamin D deficiency 02/03/2014  . Post-operative state 08/15/2013  . Encounter for routine gynecological examination 06/14/2013  . Mass of breast, right 06/14/2013  . Personal history of colonic polyps 03/10/2012  . B12 deficiency 11/11/2010  . Memory change 11/04/2010  . HYPERTRIGLYCERIDEMIA 06/14/2010  . Depression 05/31/2009  . History of tobacco abuse 03/02/2008  . Osteoporosis 04/05/2007  . CHRONIC OBSTRUCTIVE PULMONARY DISEASE 03/24/2007  . HOARSENESS, CHRONIC 03/24/2007    Medications Prior to Visit Current Outpatient Prescriptions on File Prior to Visit  Medication Sig Dispense Refill  . ALPRAZolam (XANAX) 0.25 MG tablet TAKE ONE TABLET   BY MOUTH   DAILY   AS NEEDED 30 tablet 0  . budesonide-formoterol (SYMBICORT) 160-4.5 MCG/ACT inhaler Inhale 2 puffs into the lungs 2 (two) times daily as needed. 1 Inhaler 3  . Calcium Carbonate-Vitamin D (CALTRATE 600+D) 600-400 MG-UNIT per tablet Take 1 tablet by mouth 2 (two) times daily.    . cholecalciferol (VITAMIN D) 1000 UNITS tablet Take 1,000  Units by mouth daily.    . citalopram (CELEXA) 20 MG tablet TAKE 1 TABLET BY MOUTH ONCE DAILY 30 tablet 5  . guaiFENesin (MUCINEX) 600 MG 12 hr tablet Take 1,200 mg by mouth 2 (two) times daily as needed for congestion.    Marland Kitchen ipratropium-albuterol (DUONEB) 0.5-2.5 (3) MG/3ML SOLN Take 3 mLs by nebulization 4 (four) times daily as needed. 360 mL 3  . loperamide (IMODIUM) 2 MG capsule TAKE 1 CAPSULE   (2 MG TOTAL) BY MOUTH AS NEEDED FOR DIARRHEA OR LOOSE STOOLS. 30 capsule 1  . OLANZapine (ZYPREXA) 5 MG tablet TAKE 1 TABLET BY MOUTH DAILY AT BEDTIME 30 tablet 5  . PRESCRIPTION MEDICATION B12 inj once monthly     No current facility-administered medications on file prior to visit.    Current Medications (verified) Current Outpatient Prescriptions  Medication Sig Dispense Refill  . ALPRAZolam (XANAX) 0.25 MG tablet TAKE ONE TABLET   BY MOUTH   DAILY   AS NEEDED 30 tablet 0  . budesonide-formoterol (SYMBICORT) 160-4.5 MCG/ACT inhaler Inhale 2 puffs into the lungs 2 (two) times daily as needed. 1 Inhaler 3  . Calcium Carbonate-Vitamin D (CALTRATE 600+D) 600-400 MG-UNIT per tablet Take 1 tablet by mouth 2 (two) times daily.    . cholecalciferol (VITAMIN D) 1000 UNITS tablet Take 1,000 Units by mouth daily.    . citalopram (CELEXA) 20 MG tablet TAKE 1 TABLET BY MOUTH ONCE DAILY 30 tablet 5  . DEXILANT 60 MG capsule Take 60 mg by mouth daily.    Marland Kitchen guaiFENesin (MUCINEX) 600 MG 12 hr tablet Take 1,200 mg by mouth  2 (two) times daily as needed for congestion.    Marland Kitchen ipratropium-albuterol (DUONEB) 0.5-2.5 (3) MG/3ML SOLN Take 3 mLs by nebulization 4 (four) times daily as needed. 360 mL 3  . loperamide (IMODIUM) 2 MG capsule TAKE 1 CAPSULE   (2 MG TOTAL) BY MOUTH AS NEEDED FOR DIARRHEA OR LOOSE STOOLS. 30 capsule 1  . OLANZapine (ZYPREXA) 5 MG tablet TAKE 1 TABLET BY MOUTH DAILY AT BEDTIME 30 tablet 5  . PRESCRIPTION MEDICATION B12 inj once monthly     No current facility-administered medications for this  visit.     Allergies (verified) Sulfonamide derivatives   PAST HISTORY  Family History Family History  Problem Relation Age of Onset  . Coronary artery disease Mother   . Diabetes Mother   . Hypertension Mother   . Coronary artery disease Father   . Diabetes Father     Grandfather  . Hypertension Father   . Stroke Father   . Other Father     colostomy bag  . Breast cancer Sister   . Cancer Other     niece--breast  . Diabetes Sister   . Hypertension Sister   . Mental illness Sister     x 4  . Esophageal cancer Neg Hx   . Stomach cancer Neg Hx   . Rectal cancer Neg Hx   . Colon cancer Neg Hx   . Colon polyps Neg Hx     Social History Social History  Substance Use Topics  . Smoking status: Former Smoker    Types: Cigarettes    Quit date: 08/12/2011  . Smokeless tobacco: Never Used  . Alcohol Use: No     Are there smokers in your home (other than you)? No  Risk Factors Current exercise habits: walks as able  Dietary issues discussed: continue healthy diet   Cardiac risk factors: advanced age (older than 82 for men, 56 for women) and smoking/ tobacco exposure.  Depression Screen (Note: if answer to either of the following is "Yes", a more complete depression screening is indicated)   Over the past two weeks, have you felt down, depressed or hopeless? No  Over the past two weeks, have you felt little interest or pleasure in doing things? No  Have you lost interest or pleasure in daily life? No  Do you often feel hopeless? No  Do you cry easily over simple problems? No  Activities of Daily Living In your present state of health, do you have any difficulty performing the following activities?:  Driving? Yes- not currently driving Managing money?  No Feeding yourself? No Getting from bed to chair? No . Climbing a flight of stairs? No Preparing food and eating?: No Bathing or showering? No Getting dressed: No Getting to the toilet? No Using the  toilet:No Moving around from place to place: No In the past year have you fallen or had a near fall?:No   Are you sexually active?  No  Do you have more than one partner?  No  Hearing Difficulties: No Do you often ask people to speak up or repeat themselves? No Do you experience ringing or noises in your ears? No Do you have difficulty understanding soft or whispered voices? No   Do you feel that you have a problem with memory? No  Do you often misplace items? No  Do you feel safe at home?  Yes  Cognitive Testing  Alert? Yes  Normal Appearance?Yes  Oriented to person? Yes  Place? Yes   Time?  Yes  Recall of three objects?  Yes  Can perform simple calculations? Yes  Displays appropriate judgment?Yes  Can read the correct time from a watch face?Yes   Advanced Directives have been discussed with the patient? No Review of Systems  Constitutional: Negative for fever.  Cardiovascular: Negative for chest pain.       Rare occasional palpitations  Musculoskeletal: Negative for joint pain.  Neurological: Negative for dizziness and weakness.       Denies HA  Psychiatric/Behavioral:       Denies depression/anxiety    List the Names of Other Physician/Practitioners you currently use: 1.    Indicate any recent Medical Services you may have received from other than [facility] providers in the past year (date may be approximate).  Immunization History  Administered Date(s) Administered  . Influenza Whole 03/02/2008, 04/09/2010  . Influenza, Seasonal, Injecte, Preservative Fre 06/18/2012  . Influenza,inj,Quad PF,36+ Mos 02/01/2013, 02/03/2014, 02/05/2015  . PPD Test 12/17/2010  . Pneumococcal Polysaccharide-23 03/02/2008, 02/01/2013  . Tdap 05/26/2008    Screening Tests Health Maintenance  Topic Date Due  . ZOSTAVAX  03/24/2012  . MAMMOGRAM  06/23/2015  . Hepatitis C Screening  07/12/2016 (Originally 11-Apr-1951)  . INFLUENZA VACCINE  12/25/2015  . COLONOSCOPY  02/11/2017  .  TETANUS/TDAP  05/26/2018  . HIV Screening  Completed    All answers were reviewed with the patient and necessary referrals were made:  [name],Brittany Osier S., NP   07/13/2015   History reviewed: allergies, current medications, past family history, past medical history, past social history, past surgical history and problem list  Review of Systems Pertinent items are noted in HPI.    Objective:     Body mass index is 25.16 kg/(m^2). BP 134/86 mmHg  Pulse 78  Temp(Src) 98.2 F (36.8 C) (Oral)  Resp 16  Ht 5\' 2"  (1.575 m)  Wt 137 lb 9.6 oz (62.415 kg)  BMI 25.16 kg/m2  SpO2 96%   Physical Exam  Constitutional: She is oriented to person, place, and time. She appears well-developed and well-nourished. No distress.  HENT:  Head: Normocephalic and atraumatic.  Right Ear: Tympanic membrane and ear canal normal.  Left Ear: Tympanic membrane and ear canal normal.  Mouth/Throat: Oropharynx is clear and moist.  Eyes: Pupils are equal, round, and reactive to light. No scleral icterus.  Neck: Normal range of motion. No thyromegaly present.  Cardiovascular: Normal rate and regular rhythm.   No murmur heard. Pulmonary/Chest: Effort normal and breath sounds normal. No respiratory distress. He has no wheezes. She has no rales. She exhibits no tenderness.  Abdominal: Soft. Bowel sounds are normal. He exhibits no distension and no mass. There is no tenderness. There is no rebound and no guarding.  Musculoskeletal: She exhibits no edema.  Lymphadenopathy:    She has no cervical adenopathy.  Neurological: She is alert and oriented to person, place, and time. She has normal patellar reflexes. She exhibits normal muscle tone. Coordination normal.  Skin: Skin is warm and dry. irregular mole noted on chest Psychiatric: She has a normal mood and affect. Her behavior is normal. Judgment and thought content normal.  Breasts: Examined lying Right: Without masses, retractions, discharge or axillary  adenopathy.  Left: Without masses, retractions, discharge or axillary adenopathy.  Pelvic: deferred.      Assessment & Plan:  Palpitations- Short pr noted on EKG. Pt is on antipsychotic. Will refer to Cardiology for further evaluation.   Assessment:  Plan:     During the course of the visit the patient was educated and counseled about appropriate screening and preventive services including:    Advanced directives: pt given MOST form and HCPOA form for review.  Mammogram, b12 today.   Diet review for nutrition referral? Yes ____  Not Indicated __x__   Patient Instructions (the written plan) was given to the patient.  Medicare Attestation I have personally reviewed: The patient's medical and social history Their use of alcohol, tobacco or illicit drugs Their current medications and supplements The patient's functional ability including ADLs,fall risks, home safety risks, cognitive, and hearing and visual impairment Diet and physical activities Evidence for depression or mood disorders  The patient's weight, height, BMI, and visual acuity have been recorded in the chart.  I have made referrals, counseling, and provided education to the patient based on review of the above and I have provided the patient with a written personalized care plan for preventive services.     [name],Khalen Styer S., NP   07/13/2015

## 2015-07-13 NOTE — Progress Notes (Signed)
Pre visit review using our clinic review tool, if applicable. No additional management support is needed unless otherwise documented below in the visit note. 

## 2015-07-13 NOTE — Telephone Encounter (Signed)
Please let pt know that I reviewed her EKG. There is a minor conduction change which may be normal for her. However, due to her complaint of intermittent palpitations, I would like for her to meet with cardiology.

## 2015-07-13 NOTE — Patient Instructions (Signed)
Schedule an appointment at the front desk for mole removal. Please complete lab work prior to leaving.

## 2015-07-17 ENCOUNTER — Telehealth: Payer: Self-pay | Admitting: *Deleted

## 2015-07-17 DIAGNOSIS — D649 Anemia, unspecified: Secondary | ICD-10-CM

## 2015-07-17 NOTE — Telephone Encounter (Signed)
-----   Message from [name], NP sent at 07/13/2015  2:57 PM EST ----- She is mildly anemic- I would like for her to complete IFOB please. Thyroid testing and electrolytes look good. Sugar is mildly elevated but not in diabetic range.

## 2015-07-17 NOTE — Telephone Encounter (Signed)
Notified caregiver, Joelene Millin) at [facility]. States there is not a Marine scientist on premises to relay information to. She states she will talk with her administrator and call me back and let us know how to proceed from here. Awaiting return call.

## 2015-07-18 NOTE — Telephone Encounter (Signed)
Received call back from [facility] requesting that we mail results and IFOB to their facility at [place], [place]. Lab order entered and results / IFOB mailed to facility.

## 2015-07-29 NOTE — Progress Notes (Signed)
Electrophysiology Office Note   Date:  07/30/2015   ID:  Abigail Castillo, DOB 10-29-1950, MRN [id number]  PCP:  Abigail Castillo., NP  Cardiologist:  Abigail Haw, MD    Chief Complaint  Patient presents with  . New Patient (Initial Visit)     History of Present Illness: Abigail Castillo is a 65 y.o. female who presents today for electrophysiology evaluation.   She presents for evaluation of palpitations. Her palpitations occur at random times during the day. She says that she has no exacerbating or alleviating factors. She says that they occur maybe once per month and lasts a few minutes at a time. They do not necessarily wake her from sleep, she has no chest pain or shortness of breath it's associated with the palpitations. She does have COPD, and when she is working to catch her breath she feels like her heart was going faster than it should be but this is different from her other palpitations.   Today, she denies symptoms of chest pain,  orthopnea, PND, lower extremity edema, claudication, dizziness, presyncope, syncope, bleeding, or neurologic sequela. The patient is tolerating medications without difficulties and is otherwise without complaint today.    Past Medical History  Diagnosis Date  . Osteoporosis   . COPD (chronic obstructive pulmonary disease) ([place])   . Tobacco abuse     ongoing  . DEPRESSION 05/31/2009  . Chronic mental illness   . Vitamin D deficiency   . Vitamin B 12 deficiency   . Anxiety   . Emphysema of lung ([place])   . Blood transfusion   . Esophageal stricture 03/14/2015  . Blood transfusion without reported diagnosis    Past Surgical History  Procedure Laterality Date  . Cholecystectomy    . Cervical cone biopsy    . Colonoscopy    . Esophagogastroduodenoscopy (egd) with esophageal dilation    . Upper gastrointestinal endoscopy    . Breast surgery  ?2015    lumpectomy, left  . Breast lumpectomy  1972    left breast  . Abdominal  hysterectomy       Current Outpatient Prescriptions  Medication Sig Dispense Refill  . albuterol (PROVENTIL HFA;VENTOLIN HFA) 108 (90 Base) MCG/ACT inhaler Inhale 2 puffs into the lungs every 6 (six) hours as needed for wheezing or shortness of breath. 1 Inhaler 2  . ALPRAZolam (XANAX) 0.25 MG tablet TAKE ONE TABLET   BY MOUTH   DAILY   AS NEEDED 30 tablet 0  . budesonide-formoterol (SYMBICORT) 160-4.5 MCG/ACT inhaler Inhale 2 puffs into the lungs 2 (two) times daily as needed. 1 Inhaler 3  . Calcium Carbonate-Vitamin D (CALTRATE 600+D) 600-400 MG-UNIT per tablet Take 1 tablet by mouth 2 (two) times daily.    . cholecalciferol (VITAMIN D) 1000 UNITS tablet Take 1,000 Units by mouth daily.    . citalopram (CELEXA) 20 MG tablet TAKE 1 TABLET BY MOUTH ONCE DAILY 30 tablet 5  . DEXILANT 60 MG capsule Take 60 mg by mouth daily.    Marland Kitchen guaiFENesin (MUCINEX) 600 MG 12 hr tablet Take 1,200 mg by mouth 2 (two) times daily as needed for congestion.    Marland Kitchen ipratropium-albuterol (DUONEB) 0.5-2.5 (3) MG/3ML SOLN Take 3 mLs by nebulization 4 (four) times daily as needed. 360 mL 3  . loperamide (IMODIUM) 2 MG capsule TAKE 1 CAPSULE   (2 MG TOTAL) BY MOUTH AS NEEDED FOR DIARRHEA OR LOOSE STOOLS. 30 capsule 1  . OLANZapine (ZYPREXA) 5 MG tablet TAKE 1  TABLET BY MOUTH DAILY AT BEDTIME 30 tablet 5  . PRESCRIPTION MEDICATION B12 inj once monthly     No current facility-administered medications for this visit.    Allergies:   Sulfonamide derivatives   Social History:  The patient  reports that she quit smoking about 3 years ago. Her smoking use included Cigarettes. She has never used smokeless tobacco. She reports that she does not drink alcohol or use illicit drugs.   Family History:  The patient's family history includes Breast cancer in her sister; Cancer in her other; Coronary artery disease in her father and mother; Diabetes in her father, mother, and sister; Hypertension in her father, mother, and sister;  Mental illness in her sister; Other in her father; Stroke in her father. There is no history of Esophageal cancer, Stomach cancer, Rectal cancer, Colon cancer, or Colon polyps.    ROS:  Please see the history of present illness.   Otherwise, review of systems is positive for  Shortness of breath on lying down, shortness of breath with activity, wheezing.   All other systems are reviewed and negative.    PHYSICAL EXAM: VS:  BP 128/88 mmHg  Pulse 81  Ht 5\' 2"  (1.575 m)  Wt 141 lb 6.4 oz (64.139 kg)  BMI 25.86 kg/m2 , BMI Body mass index is 25.86 kg/(m^2). GEN: Well nourished, well developed, in no acute distress HEENT: normal Neck: no JVD, carotid bruits, or masses Cardiac: RRR; no murmurs, rubs, or gallops,no edema  Respiratory:  clear to auscultation bilaterally, normal work of breathing GI: soft, nontender, nondistended, + BS MS: no deformity or atrophy Skin: warm and dry Neuro:  Strength and sensation are intact Psych: euthymic mood, full affect  EKG:  EKG is ordered today. The ekg ordered today shows  Sinus rhythm rate 810  Recent Labs: 02/28/2015: ALT 23 07/13/2015: BUN 8; Creatinine, Ser 0.75; Hemoglobin 11.9*; Platelets 224.0; Potassium 3.9; Sodium 144; TSH 0.49    Lipid Panel     Component Value Date/Time   CHOL 177 02/05/2015 0843   TRIG 130.0 02/05/2015 0843   HDL 41.40 02/05/2015 0843   CHOLHDL 4 02/05/2015 0843   VLDL 26.0 02/05/2015 0843   LDLCALC 110* 02/05/2015 0843     Wt Readings from Last 3 Encounters:  07/30/15 141 lb 6.4 oz (64.139 kg)  07/13/15 137 lb 9.6 oz (62.415 kg)  06/15/15 135 lb 3.2 oz (61.326 kg)     ASSESSMENT AND PLAN:  1.  Palpitations:  At this time it is unclear of the cause of her palpitations. I have told her that it is unlikely that these are dangerous, but she doesn't want to have them investigated further. As she is having them once every month or so, she would require a 30 day monitor to determine the cause. Once she wears the  monitor we Tonee Silverstein discuss with her the possibilities of treatment.    Current medicines are reviewed at length with the patient today.   The patient does not have concerns regarding her medicines.  The following changes were made today:  none  Labs/ tests ordered today include:  Orders Placed This Encounter  Procedures  . Cardiac event monitor  . EKG 12-Lead     Disposition:   FU with Ryland Tungate pending 30 day monitor  Signed, Armie Moren Meredith Leeds, MD  07/30/2015 10:35 AM     [facility] [address] [place] [address] [phone] (office) [phone] (fax)

## 2015-07-30 ENCOUNTER — Ambulatory Visit (INDEPENDENT_AMBULATORY_CARE_PROVIDER_SITE_OTHER): Payer: Medicare Other | Admitting: Cardiology

## 2015-07-30 ENCOUNTER — Encounter: Payer: Self-pay | Admitting: Cardiology

## 2015-07-30 VITALS — BP 128/88 | HR 81 | Ht 62.0 in | Wt 141.4 lb

## 2015-07-30 DIAGNOSIS — R002 Palpitations: Secondary | ICD-10-CM

## 2015-07-30 NOTE — Patient Instructions (Signed)
Medication Instructions:  Your physician recommends that you continue on your current medications as directed. Please refer to the Current Medication list given to you today.  Labwork: None ordered  Testing/Procedures: Your physician has recommended that you wear an event monitor. Event monitors are medical devices that record the heart's electrical activity. Doctors most often [place] these monitors to diagnose arrhythmias. Arrhythmias are problems with the speed or rhythm of the heartbeat. The monitor is a small, portable device. You can wear one while you do your normal daily activities. This is usually used to diagnose what is causing palpitations/syncope (passing out).  Follow-Up: To be determined once event monitor has been reviewed by the physician.   If you need a refill on your cardiac medications before your next appointment, please call your pharmacy.  Thank you for choosing [facility]!!   Trinidad Curet, RN [phone]    Cardiac Event Monitoring A cardiac event monitor is a small recording device used to help detect abnormal heart rhythms (arrhythmias). The monitor is used to record heart rhythm when noticeable symptoms such as the following occur:  Fast heartbeats (palpitations), such as heart racing or fluttering.  Dizziness.  Fainting or light-headedness.  Unexplained weakness. The monitor is wired to two electrodes placed on your chest. Electrodes are flat, sticky disks that attach to your skin. The monitor can be worn for up to 30 days. You will wear the monitor at all times, except when bathing.  HOW TO USE YOUR CARDIAC EVENT MONITOR A technician will prepare your chest for the electrode placement. The technician will show you how to place the electrodes, how to work the monitor, and how to replace the batteries. Take time to practice using the monitor before you leave the office. Make sure you understand how to send the information from the monitor to your health  care provider. This requires a telephone with a landline, not a cell phone. You need to:  Wear your monitor at all times, except when you are in water:  Do not get the monitor wet.  Take the monitor off when bathing. Do not swim or use a hot tub with it on.  Keep your skin clean. Do not put body lotion or moisturizer on your chest.  Change the electrodes daily or any time they stop sticking to your skin. You might need to use tape to keep them on.  It is possible that your skin under the electrodes could become irritated. To keep this from happening, try to put the electrodes in slightly different places on your chest. However, they must remain in the area under your left breast and in the upper right section of your chest.  Make sure the monitor is safely clipped to your clothing or in a location close to your body that your health care provider recommends.  Press the button to record when you feel symptoms of heart trouble, such as dizziness, weakness, light-headedness, palpitations, thumping, shortness of breath, unexplained weakness, or a fluttering or racing heart. The monitor is always on and records what happened slightly before you pressed the button, so do not worry about being too late to get good information.  Keep a diary of your activities, such as walking, doing chores, and taking medicine. It is especially important to note what you were doing when you pushed the button to record your symptoms. This will help your health care provider determine what might be contributing to your symptoms. The information stored in your monitor will be  reviewed by your health care provider alongside your diary entries.  Send the recorded information as recommended by your health care provider. It is important to understand that it will take some time for your health care provider to process the results.  Change the batteries as recommended by your health care provider. SEEK IMMEDIATE MEDICAL CARE  IF:   You have chest pain.  You have extreme difficulty breathing or shortness of breath.  You develop a very fast heartbeat that persists.  You develop dizziness that does not go away.  You faint or constantly feel you are about to faint.   This information is not intended to replace advice given to you by your health care provider. Make sure you discuss any questions you have with your health care provider.   Document Released: 02/19/2008 Document Revised: 06/02/2014 Document Reviewed: 11/08/2012 Elsevier Interactive Patient Education [employer].

## 2015-08-01 ENCOUNTER — Ambulatory Visit (INDEPENDENT_AMBULATORY_CARE_PROVIDER_SITE_OTHER): Payer: Medicare Other

## 2015-08-01 DIAGNOSIS — R002 Palpitations: Secondary | ICD-10-CM

## 2015-08-03 DIAGNOSIS — R002 Palpitations: Secondary | ICD-10-CM | POA: Diagnosis not present

## 2015-08-06 ENCOUNTER — Ambulatory Visit: Payer: Medicare Other | Admitting: Family

## 2015-08-07 ENCOUNTER — Other Ambulatory Visit (INDEPENDENT_AMBULATORY_CARE_PROVIDER_SITE_OTHER): Payer: Medicare Other

## 2015-08-07 ENCOUNTER — Encounter: Payer: Self-pay | Admitting: Family

## 2015-08-07 DIAGNOSIS — D649 Anemia, unspecified: Secondary | ICD-10-CM | POA: Diagnosis not present

## 2015-08-07 LAB — FECAL OCCULT BLOOD, IMMUNOCHEMICAL: Fecal Occult Bld: NEGATIVE

## 2015-08-09 ENCOUNTER — Other Ambulatory Visit: Payer: Self-pay | Admitting: Family

## 2015-08-09 ENCOUNTER — Telehealth: Payer: Self-pay | Admitting: Family

## 2015-08-09 NOTE — Telephone Encounter (Signed)
Pt's daughter states that she feels that pt has increased DOE, no chest pain and no SOB at rest, but does endorse "wheezing" at rest. Difficult to assess severity of wheezing as I was only able to speak w/ pt's daughter and not the pt herself. She says this has been an ongoing issue for the pt due to her COPD and does not feel that she acutely worse, however per chart review pt has had no c/o SOB recently. Daughter reports that pt is currently wearing Holter monitor for 30 days per cardiology. She feels that patient may benefit from O2 therapy or change in neb meds. Encouraged her to keep tomorrow's appointment as scheduled. Advised her that if pt develops acute worsening SOB at rest or chest pain, she should be seen immediately in ED. She verbalized understanding and agreed to instructions.

## 2015-08-09 NOTE — Telephone Encounter (Signed)
Caller name:Vinciguerra,Candace Relation to [id number]  Call back number: [phone]  Pharmacy:  Reason for call:  Patient has an appointment 08/10/2015 at 8:30am and daughter wanted to ensure patient doesn't forget to tell PCP nebulizer treatment is not working, patient is wheezing and daughter suggested inogen oxygen.

## 2015-08-10 ENCOUNTER — Emergency Department (HOSPITAL_COMMUNITY)
Admission: EM | Admit: 2015-08-10 | Discharge: 2015-08-10 | Disposition: A | Discharge status: Home / Self Care | Payer: Medicare Other | Attending: Physician Assistant | Admitting: Physician Assistant

## 2015-08-10 ENCOUNTER — Encounter (HOSPITAL_COMMUNITY): Payer: Self-pay

## 2015-08-10 ENCOUNTER — Ambulatory Visit: Payer: Medicare Other | Admitting: Family

## 2015-08-10 ENCOUNTER — Other Ambulatory Visit: Payer: Self-pay | Admitting: Family

## 2015-08-10 ENCOUNTER — Ambulatory Visit: Payer: Medicare Other

## 2015-08-10 ENCOUNTER — Telehealth: Payer: Self-pay | Admitting: *Deleted

## 2015-08-10 ENCOUNTER — Emergency Department (HOSPITAL_COMMUNITY): Payer: Medicare Other

## 2015-08-10 DIAGNOSIS — Z1231 Encounter for screening mammogram for malignant neoplasm of breast: Secondary | ICD-10-CM

## 2015-08-10 DIAGNOSIS — Z87891 Personal history of nicotine dependence: Secondary | ICD-10-CM | POA: Diagnosis not present

## 2015-08-10 DIAGNOSIS — Z79899 Other long term (current) drug therapy: Secondary | ICD-10-CM | POA: Insufficient documentation

## 2015-08-10 DIAGNOSIS — J441 Chronic obstructive pulmonary disease with (acute) exacerbation: Secondary | ICD-10-CM | POA: Diagnosis not present

## 2015-08-10 DIAGNOSIS — Z8719 Personal history of other diseases of the digestive system: Secondary | ICD-10-CM | POA: Insufficient documentation

## 2015-08-10 DIAGNOSIS — F419 Anxiety disorder, unspecified: Secondary | ICD-10-CM | POA: Insufficient documentation

## 2015-08-10 DIAGNOSIS — F329 Major depressive disorder, single episode, unspecified: Secondary | ICD-10-CM | POA: Diagnosis not present

## 2015-08-10 DIAGNOSIS — M810 Age-related osteoporosis without current pathological fracture: Secondary | ICD-10-CM | POA: Insufficient documentation

## 2015-08-10 DIAGNOSIS — Z7952 Long term (current) use of systemic steroids: Secondary | ICD-10-CM | POA: Insufficient documentation

## 2015-08-10 DIAGNOSIS — E559 Vitamin D deficiency, unspecified: Secondary | ICD-10-CM | POA: Insufficient documentation

## 2015-08-10 DIAGNOSIS — R0602 Shortness of breath: Secondary | ICD-10-CM | POA: Diagnosis not present

## 2015-08-10 DIAGNOSIS — R05 Cough: Secondary | ICD-10-CM | POA: Diagnosis not present

## 2015-08-10 LAB — CBC WITH DIFFERENTIAL/PLATELET
BASOS ABS: 0 10*3/uL (ref 0.0–0.1)
BASOS PCT: 0 %
EOS PCT: 1 %
Eosinophils Absolute: 0.1 10*3/uL (ref 0.0–0.7)
HCT: 38.2 % (ref 36.0–46.0)
Hemoglobin: 12.4 g/dL (ref 12.0–15.0)
LYMPHS PCT: 14 %
Lymphs Abs: 0.9 10*3/uL (ref 0.7–4.0)
MCH: 31.6 pg (ref 26.0–34.0)
MCHC: 32.5 g/dL (ref 30.0–36.0)
MCV: 97.2 fL (ref 78.0–100.0)
MONO ABS: 0.2 10*3/uL (ref 0.1–1.0)
Monocytes Relative: 4 %
Neutro Abs: 5.5 10*3/uL (ref 1.7–7.7)
Neutrophils Relative %: 81 %
PLATELETS: 203 10*3/uL (ref 150–400)
RBC: 3.93 MIL/uL (ref 3.87–5.11)
RDW: 13.2 % (ref 11.5–15.5)
WBC: 6.7 10*3/uL (ref 4.0–10.5)

## 2015-08-10 LAB — COMPREHENSIVE METABOLIC PANEL
ALBUMIN: 3.5 g/dL (ref 3.5–5.0)
ALK PHOS: 104 U/L (ref 38–126)
ALT: 20 U/L (ref 14–54)
AST: 23 U/L (ref 15–41)
Anion gap: 11 (ref 5–15)
BILIRUBIN TOTAL: 1 mg/dL (ref 0.3–1.2)
BUN: 7 mg/dL (ref 6–20)
CO2: 26 mmol/L (ref 22–32)
Calcium: 9.1 mg/dL (ref 8.9–10.3)
Chloride: 107 mmol/L (ref 101–111)
Creatinine, Ser: 0.69 mg/dL (ref 0.44–1.00)
GFR calc Af Amer: >60 mL/min (ref >60)
GFR calc non Af Amer: >60 mL/min (ref >60)
GLUCOSE: 99 mg/dL (ref 65–99)
Potassium: 4.2 mmol/L (ref 3.5–5.1)
Sodium: 144 mmol/L (ref 135–145)
TOTAL PROTEIN: 6.2 g/dL — AB (ref 6.5–8.1)

## 2015-08-10 MED ORDER — ALBUTEROL SULFATE (2.5 MG/3ML) 0.083% IN NEBU: 5.0000 mg | INHALATION_SOLUTION | Freq: Once | RESPIRATORY_TRACT | Status: DC

## 2015-08-10 MED ORDER — AEROCHAMBER PLUS W/MASK MISC
1.0000 | Freq: Once | Status: AC
  Administered 2015-08-10: 1
  Filled 2015-08-10: qty 1

## 2015-08-10 MED ORDER — ALBUTEROL SULFATE HFA 108 (90 BASE) MCG/ACT IN AERS
2.0000 | INHALATION_SPRAY | Freq: Once | RESPIRATORY_TRACT | Status: AC
  Administered 2015-08-10: 2 via RESPIRATORY_TRACT
  Filled 2015-08-10: qty 6.7

## 2015-08-10 MED ORDER — AZITHROMYCIN 250 MG PO TABS: 250.0000 mg | ORAL_TABLET | Freq: Once | ORAL | Status: DC

## 2015-08-10 MED ORDER — AZITHROMYCIN 250 MG PO TABS
500.0000 mg | ORAL_TABLET | Freq: Once | ORAL | Status: AC
  Administered 2015-08-10: 500 mg via ORAL
  Filled 2015-08-10: qty 2

## 2015-08-10 MED ORDER — PREDNISONE 20 MG PO TABS: ORAL_TABLET | ORAL | Status: DC

## 2015-08-10 MED ORDER — IPRATROPIUM-ALBUTEROL 0.5-2.5 (3) MG/3ML IN SOLN
3.0000 mL | Freq: Once | RESPIRATORY_TRACT | Status: AC
  Administered 2015-08-10: 3 mL via RESPIRATORY_TRACT
  Filled 2015-08-10: qty 3

## 2015-08-10 MED ORDER — PREDNISONE 20 MG PO TABS
60.0000 mg | ORAL_TABLET | Freq: Once | ORAL | Status: AC
  Administered 2015-08-10: 60 mg via ORAL
  Filled 2015-08-10: qty 3

## 2015-08-10 NOTE — ED Notes (Signed)
Patient transported to X-ray 

## 2015-08-10 NOTE — ED Notes (Signed)
Pt. Has a hx of COPD and in the last week she has increased wheezing and sob with exertion.  She has a productive cough.  She began having loose stools this am.  She denies any pain, Alert and oriented X4, Skin is p/w/d.   Pt. lives at [facility]

## 2015-08-10 NOTE — Discharge Instructions (Signed)
Please return immediately with any concerning symptoms.

## 2015-08-10 NOTE — ED Provider Notes (Signed)
CSN: [id number]     Arrival date & time 08/10/15  0859 History   First MD Initiated Contact with Patient 08/10/15 [phone]     Chief Complaint  Patient presents with  . Shortness of Breath     (Consider location/radiation/quality/duration/timing/severity/associated sxs/prior Treatment) HPI   Patient is a 65 year old female with history of COPD on multiple inhalers, history of emphysema anxiety depression. Patient's presenting today with increasing cough and sputum production. Patient has not had any fever home. They were intended to go to their primary care but the primary care since then here for further evaluation.   Patient's been taking her albuterol every 6 hours and feels like he has not been working as well.  Patient had no myalgias, fever.  No chest pain.   Past Medical History  Diagnosis Date  . Osteoporosis   . COPD (chronic obstructive pulmonary disease) ([employer])   . Tobacco abuse     ongoing  . DEPRESSION 05/31/2009  . Chronic mental illness   . Vitamin D deficiency   . Vitamin B 12 deficiency   . Anxiety   . Emphysema of lung ([place])   . Blood transfusion   . Esophageal stricture 03/14/2015  . Blood transfusion without reported diagnosis    Past Surgical History  Procedure Laterality Date  . Cholecystectomy    . Cervical cone biopsy    . Colonoscopy    . Esophagogastroduodenoscopy (egd) with esophageal dilation    . Upper gastrointestinal endoscopy    . Breast surgery  ?2015    lumpectomy, left  . Breast lumpectomy  1972    left breast  . Abdominal hysterectomy     Family History  Problem Relation Age of Onset  . Coronary artery disease Mother   . Diabetes Mother   . Hypertension Mother   . Coronary artery disease Father   . Diabetes Father     Grandfather  . Hypertension Father   . Stroke Father   . Other Father     colostomy bag  . Breast cancer Sister   . Cancer Other     niece--breast  . Diabetes Sister   . Hypertension Sister   . Mental  illness Sister     x 4  . Esophageal cancer Neg Hx   . Stomach cancer Neg Hx   . Rectal cancer Neg Hx   . Colon cancer Neg Hx   . Colon polyps Neg Hx    Social History  Substance Use Topics  . Smoking status: Former Smoker    Types: Cigarettes    Quit date: 08/12/2011  . Smokeless tobacco: Never Used  . Alcohol Use: No   OB History    No data available     Review of Systems  Constitutional: Negative for fever, activity change and fatigue.  HENT: Negative for congestion.   Respiratory: Positive for cough and shortness of breath.   Cardiovascular: Negative for chest pain, palpitations and leg swelling.  Gastrointestinal: Negative for abdominal pain.  Neurological: Negative for syncope and weakness.  Psychiatric/Behavioral: Negative for agitation.  All other systems reviewed and are negative.     Allergies  Sulfonamide derivatives  Home Medications   Prior to Admission medications   Medication Sig Start Date End Date Taking? Authorizing Provider  albuterol (PROVENTIL HFA;VENTOLIN HFA) 108 (90 Base) MCG/ACT inhaler Inhale 2 puffs into the lungs every 6 (six) hours as needed for wheezing or shortness of breath. 07/13/15   [name], NP  ALPRAZolam Duanne Moron) 0.25 MG  tablet TAKE ONE TABLET   BY MOUTH   DAILY   AS NEEDED 02/26/15   [name], NP  azithromycin (ZITHROMAX Z-PAK) 250 MG tablet Take 1 tablet (250 mg total) by mouth once. 08/10/15   Breda Bond Lyn Alexzandra Bilton, MD  budesonide-formoterol (SYMBICORT) 160-4.5 MCG/ACT inhaler Inhale 2 puffs into the lungs 2 (two) times daily as needed. 06/15/15   [name], NP  Calcium Carbonate-Vitamin D (CALTRATE 600+D) 600-400 MG-UNIT per tablet Take 1 tablet by mouth 2 (two) times daily.    Historical Provider, MD  cholecalciferol (VITAMIN D) 1000 UNITS tablet Take 1,000 Units by mouth daily.    Historical Provider, MD  citalopram (CELEXA) 20 MG tablet TAKE 1 TABLET BY MOUTH ONCE DAILY 02/23/15   [name], NP   DEXILANT 60 MG capsule Take 60 mg by mouth daily. 07/02/15   Historical Provider, MD  guaiFENesin (MUCINEX) 600 MG 12 hr tablet Take 1,200 mg by mouth 2 (two) times daily as needed for congestion.    Historical Provider, MD  ipratropium-albuterol (DUONEB) 0.5-2.5 (3) MG/3ML SOLN Take 3 mLs by nebulization 4 (four) times daily as needed. 01/01/15   [name], NP  loperamide (IMODIUM) 2 MG capsule TAKE 1 CAPSULE   (2 MG TOTAL) BY MOUTH AS NEEDED FOR DIARRHEA OR LOOSE STOOLS. 08/10/15   [name], NP  OLANZapine (ZYPREXA) 5 MG tablet TAKE 1 TABLET BY MOUTH DAILY AT BEDTIME 02/23/15   [name], NP  predniSONE (DELTASONE) 20 MG tablet Day 1 and 2: Take 3 tabs  Day 3-5: Take 2 tabs.  Day 5-8: take 1 tab 08/10/15   Shantasia Hunnell Lyn Dorothye Berni, MD  PRESCRIPTION MEDICATION B12 inj once monthly    Historical Provider, MD   BP 117/90 mmHg  Pulse 91  Temp(Src) 97.7 F (36.5 C) (Oral)  Resp 15  Ht 5\' 2"  (1.575 m)  Wt 133 lb (60.328 kg)  BMI 24.32 kg/m2  SpO2 95% Physical Exam  Constitutional: She is oriented to person, place, and time. She appears well-developed and well-nourished.  HENT:  Head: Normocephalic and atraumatic.  Eyes: Conjunctivae are normal. Right eye exhibits no discharge.  Neck: Neck supple.  Cardiovascular: Normal rate, regular rhythm and normal heart sounds.   No murmur heard. Pulmonary/Chest: Effort normal. She has wheezes. She has no rales.  Wheezing in right lung worse left. Scattered.  Abdominal: Soft. She exhibits no distension. There is no tenderness.  Musculoskeletal: Normal range of motion. She exhibits no edema.  Neurological: She is oriented to person, place, and time. No cranial nerve deficit.  Skin: Skin is warm and dry. No rash noted. She is not diaphoretic.  Psychiatric: She has a normal mood and affect. Her behavior is normal.  Nursing note and vitals reviewed.   ED Course  Procedures (including critical care time) Labs Review Labs Reviewed   COMPREHENSIVE METABOLIC PANEL - Abnormal; Notable for the following:    Total Protein 6.2 (*)    All other components within normal limits  CBC WITH DIFFERENTIAL/PLATELET    Imaging Review Dg Chest 2 View  08/10/2015  CLINICAL DATA:  Shortness of breath, productive cough. EXAM: CHEST  2 VIEW COMPARISON:  April 15, 2010. FINDINGS: The heart size and mediastinal contours are within normal limits. No pneumothorax or pleural effusion is noted. Stable emphysematous disease is noted in both upper lobes. No acute pulmonary disease is noted. The visualized skeletal structures are unremarkable. IMPRESSION: Emphysematous disease is noted bilaterally. No acute cardiopulmonary abnormality seen. Electronically Signed   By:  Marijo Conception, M.D.   On: 08/10/2015 11:33   I have personally reviewed and evaluated these images and lab results as part of my medical decision-making.   EKG Interpretation   Date/Time:  Friday August 10 2015 10:07:35 EDT Ventricular Rate:  97 PR Interval:  121 QRS Duration: 86 QT Interval:  302 QTC Calculation: 383 R Axis:   72 Text Interpretation:  Sinus rhythm Nonspecific repol abnormality, diffuse  leads Baseline wander in lead(s) V5 no acute ischemia No significant  change since last tracing Confirmed by Gerald Leitz ([id number]) on  08/10/2015 10:16:18 AM      MDM   Final diagnoses:  COPD exacerbation [facility])   patient is a 65 year old female with history of COPD presenting today with shortness of breath. For the last week patient's had increase in sputum and shortness of breath. She feels like her albuterol has not been working as well.  Patient has not had fever, myalgias.  We'll treat for COPD exacerbation. Will get chest x-ray to rule out pneumonia. We'll likely be able to discharge home given patient is 96% on room air and breathing comfortably.  2:25 PM STill normal vitals, breathing comfortably.   Plan to discharge with prednisone, azithro and follow  up with PCP.   Tulip Meharg Julio Alm, MD 08/10/15 1425

## 2015-08-10 NOTE — Telephone Encounter (Signed)
Pt daughter called in stating that the pt is having "breathing problems." She has strong productive cough and wheezing, which I was able to hear in the background of the phone call. Her daughter states she cannot catch her breath, even while resting and that she is very weak. She had one episode of stool incontinence this morning. She is unsure if she has a fever, but daughter states "she feels warm to me." The pt denies denies nausea and vomiting and chest pain and tightness. Advised pt's daughter to have pt seen in ED urgently d/t SOB at rest and wheezing. She agreed to instructions and stated she would most likely take pt to [facility] ED.

## 2015-08-12 ENCOUNTER — Telehealth: Payer: Self-pay | Admitting: Family

## 2015-08-12 NOTE — Telephone Encounter (Signed)
Please contact pt to see how she is feeling and arrange ED visit with me this week, ok to put in a 15 min slot.

## 2015-08-14 NOTE — Telephone Encounter (Signed)
Pt called in today and scheduled appt for tomorrow.

## 2015-08-15 ENCOUNTER — Encounter: Payer: Self-pay | Admitting: Family

## 2015-08-15 ENCOUNTER — Ambulatory Visit (INDEPENDENT_AMBULATORY_CARE_PROVIDER_SITE_OTHER): Payer: Medicare Other | Admitting: Family

## 2015-08-15 VITALS — BP 112/82 | HR 80 | Temp 97.4°F | Resp 16 | Ht 62.0 in | Wt 140.8 lb

## 2015-08-15 DIAGNOSIS — E538 Deficiency of other specified B group vitamins: Secondary | ICD-10-CM | POA: Diagnosis not present

## 2015-08-15 DIAGNOSIS — J441 Chronic obstructive pulmonary disease with (acute) exacerbation: Secondary | ICD-10-CM | POA: Diagnosis not present

## 2015-08-15 MED ORDER — CYANOCOBALAMIN 1000 MCG/ML IJ SOLN
1000.0000 ug | Freq: Once | INTRAMUSCULAR | Status: AC
  Administered 2015-08-15: 1000 ug via INTRAMUSCULAR

## 2015-08-15 NOTE — Patient Instructions (Addendum)
We will work on getting home oxygen and referral to the pulmonary doctor. Please start zpak (antibiotic) and prednisone. This will help your breathing/shortness of breath. Use your spacer when you take the Symbicort.  Call us if you have any problems using the spacer. Do not start omeprazole, continue dexilant.  Follow up in 6 weeks for mole removal and follow up of your COPD.

## 2015-08-15 NOTE — Progress Notes (Signed)
Subjective:    Patient ID: Abigail Castillo, female    DOB: [birth year], 65 y.o.   MRN: [id number]  HPI  Abigail Castillo is a 65 yr old female who presents today for ED follow up. ED record is reviewed.  Was evaluated in the ED for COPD exacerbation.  Reports overall feeling better.   However still becomes very winded with exertion.  She continues to cough, productive at times, light green sputum.  Was given prednisone and a zpak on 3/17.  For some reason she did not start the medication yet.  Family is not here today but she is accompanied by a friend who reports that the family is inquiring about need for home oxygen.   Had diarrhea initially, however this has resolved.    Review of Systems    see HPI  Past Medical History  Diagnosis Date  . Osteoporosis   . COPD (chronic obstructive pulmonary disease) ([place])   . Tobacco abuse     ongoing  . DEPRESSION 05/31/2009  . Chronic mental illness   . Vitamin D deficiency   . Vitamin B 12 deficiency   . Anxiety   . Emphysema of lung ([place])   . Blood transfusion   . Esophageal stricture 03/14/2015  . Blood transfusion without reported diagnosis     Social History   Social History  . Marital Status: Divorced    Spouse Name: N/A  . Number of Children: 3  . Years of Education: N/A   Occupational History  . disabled    Social History Main Topics  . Smoking status: Former Smoker    Types: Cigarettes    Quit date: 08/12/2011  . Smokeless tobacco: Never Used  . Alcohol Use: No  . Drug Use: No  . Sexual Activity: Not on file   Other Topics Concern  . Not on file   Social History Narrative    Past Surgical History  Procedure Laterality Date  . Cholecystectomy    . Cervical cone biopsy    . Colonoscopy    . Esophagogastroduodenoscopy (egd) with esophageal dilation    . Upper gastrointestinal endoscopy    . Breast surgery  ?2015    lumpectomy, left  . Breast lumpectomy  1972    left breast  . Abdominal hysterectomy       Family History  Problem Relation Age of Onset  . Coronary artery disease Mother   . Diabetes Mother   . Hypertension Mother   . Coronary artery disease Father   . Diabetes Father     Grandfather  . Hypertension Father   . Stroke Father   . Other Father     colostomy bag  . Breast cancer Sister   . Cancer Other     niece--breast  . Diabetes Sister   . Hypertension Sister   . Mental illness Sister     x 4  . Esophageal cancer Neg Hx   . Stomach cancer Neg Hx   . Rectal cancer Neg Hx   . Colon cancer Neg Hx   . Colon polyps Neg Hx     Allergies  Allergen Reactions  . Sulfonamide Derivatives Hives    Current Outpatient Prescriptions on File Prior to Visit  Medication Sig Dispense Refill  . albuterol (PROVENTIL HFA;VENTOLIN HFA) 108 (90 Base) MCG/ACT inhaler Inhale 2 puffs into the lungs every 6 (six) hours as needed for wheezing or shortness of breath. 1 Inhaler 2  . ALPRAZolam (XANAX) 0.25 MG tablet TAKE  ONE TABLET   BY MOUTH   DAILY   AS NEEDED 30 tablet 0  . azithromycin (ZITHROMAX Z-PAK) 250 MG tablet Take 1 tablet (250 mg total) by mouth once. 4 tablet 0  . Calcium Carbonate-Vitamin D (CALTRATE 600+D) 600-400 MG-UNIT per tablet Take 1 tablet by mouth 2 (two) times daily.    . cholecalciferol (VITAMIN D) 1000 UNITS tablet Take 1,000 Units by mouth daily.    . citalopram (CELEXA) 20 MG tablet TAKE 1 TABLET BY MOUTH ONCE DAILY 30 tablet 5  . guaiFENesin (MUCINEX) 600 MG 12 hr tablet Take 1,200 mg by mouth 2 (two) times daily as needed for congestion.    Marland Kitchen ipratropium-albuterol (DUONEB) 0.5-2.5 (3) MG/3ML SOLN Take 3 mLs by nebulization 4 (four) times daily as needed. 360 mL 3  . loperamide (IMODIUM) 2 MG capsule TAKE 1 CAPSULE   (2 MG TOTAL) BY MOUTH AS NEEDED FOR DIARRHEA OR LOOSE STOOLS. 30 capsule 0  . OLANZapine (ZYPREXA) 5 MG tablet TAKE 1 TABLET BY MOUTH DAILY AT BEDTIME 30 tablet 5  . predniSONE (DELTASONE) 20 MG tablet Day 1 and 2: Take 3 tabs  Day 3-5: Take 2  tabs.  Day 5-8: take 1 tab 16 tablet 0  . PRESCRIPTION MEDICATION B12 inj once monthly    . budesonide-formoterol (SYMBICORT) 160-4.5 MCG/ACT inhaler Inhale 2 puffs into the lungs 2 (two) times daily as needed. (Patient not taking: Reported on 08/15/2015) 1 Inhaler 3   No current facility-administered medications on file prior to visit.    BP 112/82 mmHg  Pulse 80  Temp(Src) 97.4 F (36.3 C) (Oral)  Resp 16  Ht 5\' 2"  (1.575 m)  Wt 140 lb 12.2 oz (63.848 kg)  BMI 25.74 kg/m2  SpO2 96%      Objective:   Physical Exam  Constitutional: She is oriented to person, place, and time. She appears well-developed and well-nourished.  HENT:  Head: Normocephalic and atraumatic.  Cardiovascular: Normal rate, regular rhythm and normal heart sounds.   No murmur heard. Pulmonary/Chest: Effort normal. No respiratory distress. She has wheezes.  Neurological: She is alert and oriented to person, place, and time.  Psychiatric: She has a normal mood and affect. Her behavior is normal. Judgment and thought content normal.          Assessment & Plan:  Will ambulation on room air oxygen dropped from 94-88%.

## 2015-08-15 NOTE — Progress Notes (Signed)
Pre visit review using our clinic review tool, if applicable. No additional management support is needed unless otherwise documented below in the visit note. 

## 2015-08-15 NOTE — Assessment & Plan Note (Signed)
Advised pt to start zpak and prednisone.  Will refer to pulmonology. Will also initiate home oxygen 2 liters via nasal cannula.

## 2015-08-15 NOTE — Assessment & Plan Note (Signed)
b12 injection today.  

## 2015-08-16 ENCOUNTER — Telehealth: Payer: Self-pay | Admitting: Family

## 2015-08-16 NOTE — Telephone Encounter (Signed)
Caller name: Joelene Millin Relationship to patient: Aldona Bar Can be [phone]   Reason for call: Needs an order for patients Oxygen and orders for prescriptions  Orders can be faxed to :[phone]

## 2015-08-17 ENCOUNTER — Telehealth: Payer: Self-pay | Admitting: Family

## 2015-08-17 DIAGNOSIS — J441 Chronic obstructive pulmonary disease with (acute) exacerbation: Secondary | ICD-10-CM

## 2015-08-17 NOTE — Telephone Encounter (Signed)
Spoke with Joelene Millin and advised her that PCP did not start pt on any medication at last visit but did initiate continuous oxygen and we are working on referral with [place]. I faxed copy of O2 referral to below #.

## 2015-08-17 NOTE — Telephone Encounter (Signed)
Yes please

## 2015-08-17 NOTE — Telephone Encounter (Signed)
Spoke with Melissa at [facility], she states O2 order needs to be entered through [facility] DME for oxygen in EPIC.  Also if pt's room air sat was 88% or less no further sats are needed. If room air sat is > 88 we need to get ambulatory sat on room air and then Sat with ambulatory O2 and L/min. Do you want me to have pt come in for nurse visit to complete the ambulatory sat and sat with O2?

## 2015-08-17 NOTE — Telephone Encounter (Signed)
Melissa-- I have scheduled pt's nurse visit for Tuesday at 10:45am to complete ambulatory O2 sat while on O2.  Do you want her placed on 2L/min for ambulatory O2? Also please see pended DME order for O2 and complete remaining fields.

## 2015-08-17 NOTE — Telephone Encounter (Signed)
Done.  Will wait to sign off until we have the data from [place] visit.

## 2015-08-17 NOTE — Telephone Encounter (Signed)
Caller name:Josey Relationship to patient:[facility]  Can be reached:[phone] OR ON HER CELL [phone] Pharmacy:  Reason for call:SHE NEEDS THE ORDER FOR OXYGEN DONE DIFFERENTLY AS Graciela HAS MEDICARE

## 2015-08-21 ENCOUNTER — Ambulatory Visit (INDEPENDENT_AMBULATORY_CARE_PROVIDER_SITE_OTHER): Payer: Medicare Other | Admitting: Family

## 2015-08-21 VITALS — HR 100

## 2015-08-21 DIAGNOSIS — J441 Chronic obstructive pulmonary disease with (acute) exacerbation: Secondary | ICD-10-CM | POA: Diagnosis not present

## 2015-08-21 NOTE — Addendum Note (Signed)
Addended by: Dorrene German on: 08/21/2015 12:11 PM   Modules accepted: Miquel Dunn

## 2015-08-21 NOTE — Progress Notes (Signed)
Pre visit review using our clinic review tool, if applicable. No additional management support is needed unless otherwise documented below in the visit note.  Pt here for ambulatory pulse ox monitoring on 2L O2 per 08/17/15 phone note. Pt ambulated 2 laps around the office on 2L O2. Sat prior to ambulation 95%. O2 sat during first lap remained between 93-96%. O2 on second lap dropped to 88-92& still on 2L [place]. Resting O2 before ambulation 96%. Resting O2 sat after ambulation 98%.   Per [name], NP: Ok to proceed with orders for home O2.   Dorrene German, RN

## 2015-08-21 NOTE — Telephone Encounter (Signed)
O2 order signed.   Melissa-- can you addend the last office note to include her ambulatory O2 @ 2L/min as documented in today's nurse visit note as requested by [place]?

## 2015-08-21 NOTE — Progress Notes (Signed)
_96_% on RA at rest    88__% on RA with ambulation    _88_% on 2__L O2 with ambulation

## 2015-08-21 NOTE — Telephone Encounter (Signed)
Done

## 2015-08-27 ENCOUNTER — Other Ambulatory Visit: Payer: Self-pay | Admitting: Family

## 2015-08-30 ENCOUNTER — Other Ambulatory Visit: Payer: Self-pay | Admitting: Family

## 2015-08-30 NOTE — Telephone Encounter (Signed)
Refilled patient rx refill #30 with 0 refills

## 2015-09-03 ENCOUNTER — Ambulatory Visit
Admission: RE | Admit: 2015-09-03 | Discharge: 2015-09-03 | Disposition: A | Discharge status: Home / Self Care | Payer: Medicare Other | Source: Ambulatory Visit | Attending: Family | Admitting: Family

## 2015-09-03 DIAGNOSIS — Z1231 Encounter for screening mammogram for malignant neoplasm of breast: Secondary | ICD-10-CM | POA: Diagnosis not present

## 2015-09-04 ENCOUNTER — Other Ambulatory Visit: Payer: Self-pay | Admitting: Family

## 2015-09-04 DIAGNOSIS — R928 Other abnormal and inconclusive findings on diagnostic imaging of breast: Secondary | ICD-10-CM

## 2015-09-05 ENCOUNTER — Telehealth: Payer: Self-pay | Admitting: Family

## 2015-09-05 NOTE — Telephone Encounter (Signed)
Please let pt know that the radiologist would like her to complete some additional breast images for further evaluation. Let me know if she has not been contacted by them about a follow up appointment in 1 week.   

## 2015-09-06 ENCOUNTER — Other Ambulatory Visit: Payer: Self-pay | Admitting: Family

## 2015-09-06 ENCOUNTER — Other Ambulatory Visit: Payer: Self-pay

## 2015-09-06 DIAGNOSIS — R928 Other abnormal and inconclusive findings on diagnostic imaging of breast: Secondary | ICD-10-CM

## 2015-09-11 ENCOUNTER — Encounter: Payer: Self-pay | Admitting: Family

## 2015-09-11 ENCOUNTER — Ambulatory Visit (INDEPENDENT_AMBULATORY_CARE_PROVIDER_SITE_OTHER): Payer: Medicare Other | Admitting: Family

## 2015-09-11 VITALS — BP 120/88 | HR 92 | Temp 98.3°F | Resp 20 | Ht 62.0 in | Wt 138.2 lb

## 2015-09-11 DIAGNOSIS — E538 Deficiency of other specified B group vitamins: Secondary | ICD-10-CM | POA: Diagnosis not present

## 2015-09-11 DIAGNOSIS — J441 Chronic obstructive pulmonary disease with (acute) exacerbation: Secondary | ICD-10-CM

## 2015-09-11 MED ORDER — CYANOCOBALAMIN 1000 MCG/ML IJ SOLN
1000.0000 ug | INTRAMUSCULAR | Status: DC
  Administered 2015-09-11 – 2015-10-15 (×2): 1000 ug via INTRAMUSCULAR

## 2015-09-11 NOTE — Progress Notes (Signed)
Pre visit review using our clinic review tool, if applicable. No additional management support is needed unless otherwise documented below in the visit note. 

## 2015-09-11 NOTE — Progress Notes (Signed)
Subjective:    Patient ID: Abigail Castillo, female    DOB: Sep 20, 1950, 65 y.o.   MRN: [id number]  HPI  Pt presents today for COPD follow up. Breathing is improved. Reports intermittent wheezing which is relieved by albuterol inhaler bid. Denies SOB unless she is "moving quickly." Denies cough.    Review of Systems    see HPI  Past Medical History  Diagnosis Date  . Osteoporosis   . COPD (chronic obstructive pulmonary disease) ([place])   . Tobacco abuse     ongoing  . DEPRESSION 05/31/2009  . Chronic mental illness   . Vitamin D deficiency   . Vitamin B 12 deficiency   . Anxiety   . Emphysema of lung ([place])   . Blood transfusion   . Esophageal stricture 03/14/2015  . Blood transfusion without reported diagnosis      Social History   Social History  . Marital Status: Divorced    Spouse Name: N/A  . Number of Children: 3  . Years of Education: N/A   Occupational History  . disabled    Social History Main Topics  . Smoking status: Former Smoker    Types: Cigarettes    Quit date: 08/12/2011  . Smokeless tobacco: Never Used  . Alcohol Use: No  . Drug Use: No  . Sexual Activity: Not on file   Other Topics Concern  . Not on file   Social History Narrative    Past Surgical History  Procedure Laterality Date  . Cholecystectomy    . Cervical cone biopsy    . Colonoscopy    . Esophagogastroduodenoscopy (egd) with esophageal dilation    . Upper gastrointestinal endoscopy    . Breast surgery  ?2015    lumpectomy, left  . Breast lumpectomy  1972    left breast  . Abdominal hysterectomy      Family History  Problem Relation Age of Onset  . Coronary artery disease Mother   . Diabetes Mother   . Hypertension Mother   . Coronary artery disease Father   . Diabetes Father     Grandfather  . Hypertension Father   . Stroke Father   . Other Father     colostomy bag  . Breast cancer Sister   . Cancer Other     niece--breast  . Diabetes Sister   . Hypertension  Sister   . Mental illness Sister     x 4  . Esophageal cancer Neg Hx   . Stomach cancer Neg Hx   . Rectal cancer Neg Hx   . Colon cancer Neg Hx   . Colon polyps Neg Hx     Allergies  Allergen Reactions  . Sulfonamide Derivatives Hives    Current Outpatient Prescriptions on File Prior to Visit  Medication Sig Dispense Refill  . albuterol (PROVENTIL HFA;VENTOLIN HFA) 108 (90 Base) MCG/ACT inhaler Inhale 2 puffs into the lungs every 6 (six) hours as needed for wheezing or shortness of breath. 1 Inhaler 2  . ALPRAZolam (XANAX) 0.25 MG tablet TAKE ONE TABLET   BY MOUTH   DAILY   AS NEEDED 30 tablet 0  . budesonide-formoterol (SYMBICORT) 160-4.5 MCG/ACT inhaler Inhale 2 puffs into the lungs 2 (two) times daily as needed. 1 Inhaler 3  . Calcium Carbonate-Vitamin D (CALTRATE 600+D) 600-400 MG-UNIT per tablet Take 1 tablet by mouth 2 (two) times daily.    . cholecalciferol (VITAMIN D) 1000 UNITS tablet Take 3,000 Units by mouth daily.     Marland Kitchen  citalopram (CELEXA) 20 MG tablet TAKE 1 TABLET BY MOUTH ONCE DAILY 30 tablet 5  . dexlansoprazole (DEXILANT) 60 MG capsule Take 60 mg by mouth daily.    Marland Kitchen ipratropium-albuterol (DUONEB) 0.5-2.5 (3) MG/3ML SOLN Take 3 mLs by nebulization 4 (four) times daily as needed. 360 mL 3  . loperamide (IMODIUM) 2 MG capsule TAKE 1 CAPSULE   (2 MG TOTAL) BY MOUTH AS NEEDED FOR DIARRHEA OR LOOSE STOOLS. 30 capsule 0  . OLANZapine (ZYPREXA) 5 MG tablet TAKE 1 TABLET BY MOUTH DAILY AT BEDTIME 30 tablet 5  . PRESCRIPTION MEDICATION B12 inj once monthly    . guaiFENesin (MUCINEX) 600 MG 12 hr tablet Take 1,200 mg by mouth 2 (two) times daily as needed for congestion. Reported on 09/11/2015     No current facility-administered medications on file prior to visit.    BP 120/88 mmHg  Pulse 92  Temp(Src) 98.3 F (36.8 C) (Oral)  Resp 20  Ht 5\' 2"  (1.575 m)  Wt 138 lb 3.2 oz (62.687 kg)  BMI 25.27 kg/m2  SpO2 96%    Objective:   Physical Exam  Constitutional: She is  oriented to person, place, and time. She appears well-developed and well-nourished.  Cardiovascular: Normal rate, regular rhythm and normal heart sounds.   No murmur heard. Pulmonary/Chest: Effort normal and breath sounds normal. No respiratory distress. She has no wheezes.  Musculoskeletal: She exhibits no edema.  Neurological: She is alert and oriented to person, place, and time.  Psychiatric: She has a normal mood and affect. Her behavior is normal. Judgment and thought content normal.          Assessment & Plan:

## 2015-09-12 ENCOUNTER — Ambulatory Visit
Admission: RE | Admit: 2015-09-12 | Discharge: 2015-09-12 | Disposition: A | Discharge status: Home / Self Care | Payer: Medicare Other | Source: Ambulatory Visit | Attending: Family | Admitting: Family

## 2015-09-12 ENCOUNTER — Other Ambulatory Visit: Payer: Self-pay | Admitting: Family

## 2015-09-12 DIAGNOSIS — R928 Other abnormal and inconclusive findings on diagnostic imaging of breast: Secondary | ICD-10-CM

## 2015-09-12 DIAGNOSIS — N63 Unspecified lump in breast: Secondary | ICD-10-CM | POA: Diagnosis not present

## 2015-09-12 NOTE — Telephone Encounter (Signed)
Left message for pt's daughter to return my call

## 2015-09-13 NOTE — Assessment & Plan Note (Signed)
Stable and improved.  Continue current symbicort, prn albuterol.

## 2015-09-14 NOTE — Telephone Encounter (Signed)
Additional tests have been ordered per Radiology note on left breast u/s.

## 2015-09-17 ENCOUNTER — Ambulatory Visit
Admission: RE | Admit: 2015-09-17 | Discharge: 2015-09-17 | Disposition: A | Discharge status: Home / Self Care | Payer: Medicare Other | Source: Ambulatory Visit | Attending: Family | Admitting: Family

## 2015-09-17 ENCOUNTER — Other Ambulatory Visit: Payer: Self-pay | Admitting: Family

## 2015-09-17 DIAGNOSIS — R928 Other abnormal and inconclusive findings on diagnostic imaging of breast: Secondary | ICD-10-CM

## 2015-09-17 DIAGNOSIS — N6012 Diffuse cystic mastopathy of left breast: Secondary | ICD-10-CM | POA: Diagnosis not present

## 2015-09-17 DIAGNOSIS — N63 Unspecified lump in breast: Secondary | ICD-10-CM | POA: Diagnosis not present

## 2015-09-18 ENCOUNTER — Encounter: Payer: Self-pay | Admitting: Emergency Medicine

## 2015-09-18 ENCOUNTER — Ambulatory Visit (INDEPENDENT_AMBULATORY_CARE_PROVIDER_SITE_OTHER): Payer: Medicare Other | Admitting: Emergency Medicine

## 2015-09-18 ENCOUNTER — Other Ambulatory Visit: Payer: Self-pay | Admitting: Family

## 2015-09-18 VITALS — BP 120/86 | HR 89 | Ht 62.0 in | Wt 139.0 lb

## 2015-09-18 DIAGNOSIS — Z87891 Personal history of nicotine dependence: Secondary | ICD-10-CM | POA: Diagnosis not present

## 2015-09-18 DIAGNOSIS — J449 Chronic obstructive pulmonary disease, unspecified: Secondary | ICD-10-CM | POA: Diagnosis not present

## 2015-09-18 NOTE — Assessment & Plan Note (Signed)
Tobacco history and clinical syndrome consistent with COPD characterized by frequent exacerbations. Her most recent one was in March. She appears of stabilized since then. She has difficulty remembering the names of medications but does state that she's been on other bronchodilators in the past. I believe we need to perform full PFT, confirm that she does not desaturate with ambulation, then assess her BD's. May want to change her to combination LABA / LAMA.

## 2015-09-18 NOTE — Patient Instructions (Signed)
We will perform walking oximetry today on room air Please continue Symbicort twice a day as you have been taking it Use albuterol 2 puffs if needed for shortness of breath We will arrange for full pulmonary function testing to be done before your next visit Follow with Dr Lamonte Sakai next available to review your testing and to decide whether we will alter your breathing medicines.

## 2015-09-18 NOTE — Progress Notes (Signed)
Subjective:    Patient ID: Abigail Castillo, female    DOB: June 21, 1950, 65 y.o.   MRN: [id number]  HPI 65 year old former smoker with a documented history of COPD made years ago, believes that she had spirometry a long time ago, cant recall where. Also with a history of osteoporosis and depression, B12 deficiency. She is followed by [facility] primary care. She was seen in ED 3/17 for an AE-COPD, was treated with pred. She states that she exacerbates frequently. She is on Symbicort, has used it for years. Uses SABA at least once a day. She typically has daily cough, has been better lately. Has exertional SOB, is limited by her breathing.    Review of Systems  Constitutional: Negative for fever and unexpected weight change.  HENT: Negative for congestion, dental problem, ear pain, nosebleeds, postnasal drip, rhinorrhea, sinus pressure, sneezing, sore throat and trouble swallowing.   Eyes: Negative for redness and itching.  Respiratory: Negative for cough, chest tightness, shortness of breath and wheezing.   Cardiovascular: Negative for palpitations and leg swelling.  Gastrointestinal: Negative for nausea and vomiting.  Genitourinary: Negative for dysuria.  Musculoskeletal: Negative for joint swelling.  Skin: Negative for rash.  Neurological: Negative for headaches.  Hematological: Does not bruise/bleed easily.  Psychiatric/Behavioral: Negative for dysphoric mood. The patient is not nervous/anxious.     Past Medical History  Diagnosis Date  . Osteoporosis   . COPD (chronic obstructive pulmonary disease) ([place])   . Tobacco abuse     ongoing  . DEPRESSION 05/31/2009  . Chronic mental illness   . Vitamin D deficiency   . Vitamin B 12 deficiency   . Anxiety   . Emphysema of lung ([place])   . Blood transfusion   . Esophageal stricture 03/14/2015  . Blood transfusion without reported diagnosis      Family History  Problem Relation Age of Onset  . Coronary artery disease Mother   . Diabetes  Mother   . Hypertension Mother   . Coronary artery disease Father   . Diabetes Father     Grandfather  . Hypertension Father   . Stroke Father   . Other Father     colostomy bag  . Breast cancer Sister   . Cancer Other     niece--breast  . Diabetes Sister   . Hypertension Sister   . Mental illness Sister     x 4  . Esophageal cancer Neg Hx   . Stomach cancer Neg Hx   . Rectal cancer Neg Hx   . Colon cancer Neg Hx   . Colon polyps Neg Hx      Social History   Social History  . Marital Status: Divorced    Spouse Name: N/A  . Number of Children: 3  . Years of Education: N/A   Occupational History  . disabled    Social History Main Topics  . Smoking status: Former Smoker    Types: Cigarettes    Quit date: 08/12/2011  . Smokeless tobacco: Never Used  . Alcohol Use: No  . Drug Use: No  . Sexual Activity: Not on file   Other Topics Concern  . Not on file   Social History Narrative     Allergies  Allergen Reactions  . Sulfonamide Derivatives Hives     Outpatient Prescriptions Prior to Visit  Medication Sig Dispense Refill  . albuterol (PROVENTIL HFA;VENTOLIN HFA) 108 (90 Base) MCG/ACT inhaler Inhale 2 puffs into the lungs every 6 (six) hours as needed  for wheezing or shortness of breath. 1 Inhaler 2  . ALPRAZolam (XANAX) 0.25 MG tablet TAKE ONE TABLET   BY MOUTH   DAILY   AS NEEDED 30 tablet 0  . budesonide-formoterol (SYMBICORT) 160-4.5 MCG/ACT inhaler Inhale 2 puffs into the lungs 2 (two) times daily as needed. 1 Inhaler 3  . Calcium Carbonate-Vitamin D (CALTRATE 600+D) 600-400 MG-UNIT per tablet Take 1 tablet by mouth 2 (two) times daily.    . cholecalciferol (VITAMIN D) 1000 UNITS tablet Take 3,000 Units by mouth daily.     . citalopram (CELEXA) 20 MG tablet TAKE 1 TABLET BY MOUTH ONCE DAILY 30 tablet 5  . dexlansoprazole (DEXILANT) 60 MG capsule Take 60 mg by mouth daily.    Marland Kitchen guaiFENesin (MUCINEX) 600 MG 12 hr tablet Take 1,200 mg by mouth 2 (two) times  daily as needed for congestion. Reported on 09/11/2015    . ipratropium-albuterol (DUONEB) 0.5-2.5 (3) MG/3ML SOLN Take 3 mLs by nebulization 4 (four) times daily as needed. 360 mL 3  . loperamide (IMODIUM) 2 MG capsule TAKE 1 CAPSULE   (2 MG TOTAL) BY MOUTH AS NEEDED FOR DIARRHEA OR LOOSE STOOLS. 30 capsule 0  . OLANZapine (ZYPREXA) 5 MG tablet TAKE 1 TABLET BY MOUTH DAILY AT BEDTIME 30 tablet 5  . PRESCRIPTION MEDICATION B12 inj once monthly     Facility-Administered Medications Prior to Visit  Medication Dose Route Frequency Provider Last Rate Last Dose  . cyanocobalamin ((VITAMIN B-12)) injection 1,000 mcg  1,000 mcg Intramuscular Q30 days [name], NP   1,000 mcg at 09/11/15 1217         Objective:   Physical Exam  Filed Vitals:   09/18/15 1627  BP: 120/86  Pulse: 89  Height: 5\' 2"  (1.575 m)  Weight: 139 lb (63.05 kg)  SpO2: 99%   Gen: Pleasant, well-nourished, in no distress  ENT: No lesions,  Edentulous, mouth clear,  oropharynx clear, no postnasal drip  Neck: No JVD, no TMG, no carotid bruits  Lungs: No use of accessory muscles,  clear without rales or rhonchi  Cardiovascular: RRR, heart sounds normal, no murmur or gallops, no peripheral edema  Musculoskeletal: No deformities, no cyanosis or clubbing  Neuro: alert, non focal, oriented but fairly simple responses, poor recall of her med and history  Skin: Warm, no lesions or rashes       Assessment & Plan:  History of tobacco abuse No longer smoking  CHRONIC OBSTRUCTIVE PULMONARY DISEASE Tobacco history and clinical syndrome consistent with COPD characterized by frequent exacerbations. Her most recent one was in March. She appears of stabilized since then. She has difficulty remembering the names of medications but does state that she's been on other bronchodilators in the past. I believe we need to perform full PFT, confirm that she does not desaturate with ambulation, then assess her BD's. May want  to change her to combination LABA / LAMA.

## 2015-09-18 NOTE — Assessment & Plan Note (Signed)
No longer smoking 

## 2015-10-04 ENCOUNTER — Ambulatory Visit: Payer: Self-pay | Admitting: Surgery

## 2015-10-04 DIAGNOSIS — N6320 Unspecified lump in the left breast, unspecified quadrant: Secondary | ICD-10-CM

## 2015-10-04 DIAGNOSIS — N63 Unspecified lump in breast: Secondary | ICD-10-CM | POA: Diagnosis not present

## 2015-10-04 NOTE — H&P (Signed)
Abigail Castillo 10/04/2015 10:36 AM Location: [facility] Patient #: [id number] DOB: 1950/07/29 Divorced / Language: Cleophus Molt / Race: White Female  History of Present Illness Abigail Moores A. Kennen Stammer MD; 10/04/2015 12:36 PM) Patient words: Patient sent at the request of the [facility] [place] due to left breast mammographic abnormality. This was core biopsied and found to be consistent with a complex sclerosing lesion. Patient denies any history of breast pain, nipple discharge, breast mass or family history of breast cancer.                  Diagnosis Breast, left, needle core biopsy, 10:00 o'clock - COMPLEX SCLEROSING LESION. - FIBROCYSTIC CHANGES WITH ADENOSIS AND CALCIFICATIONS. - SEE COMMENT.   ADDENDUM REPORT: 09/18/2015 10:59 ADDENDUM: Pathology revealed COMPLEX SCLEROSING LESION, FIBROCYSTIC CHANGES WITH ADENOSIS AND CALCIFICATIONS of the Left breast at the 10:00 o'clock location, with excision recommended. The differential diagnosis of the complex sclerosing lesion includes a radial scar. This was found to be concordant by Dr. [name]. Pathology results were discussed by phone with [name], Administrator at [facility], the patient has dementia. [name] reported the patient did well after the biopsy. Post biopsy instructions and care were reviewed and questions were answered. [name] was encouraged to call [facility] for any additional concerns. Surgical consultation has been arranged with Dr. Erroll Luna at [facility] on Oct 04, 2015. Pathology results reported by [name], RN on 09/18/2015. Electronically Signed By: [name] M.D. On: 09/18/2015 10:59.  The patient is a 65 year old female.   Other Problems [name], [name]; 10/04/2015 10:36 AM) Chronic Obstructive Lung Disease Depression Oophorectomy  Past Surgical History [name], [name]; 10/04/2015 10:36 AM) Breast  Biopsy Left. Hysterectomy (not due to cancer) - Complete Oral Surgery  Diagnostic Studies History [name], [name]; 10/04/2015 10:36 AM) Colonoscopy 1-5 years ago Mammogram within last year  Allergies [name], [name]; 10/04/2015 10:36 AM) Sulfonamide Derivatives  Medication History [name], [name]; 10/04/2015 10:39 AM) Loperamide HCl (2MG  Capsule, Oral) Active. Ventolin HFA (108 (90 Base)MCG/ACT Aerosol Soln, Inhalation) Active. Symbicort (160-4.5MCG/ACT Aerosol, Inhalation) Active. Dexlansoprazole (60MG  Capsule DR, Oral) Active. Mucinex (600MG  Tablet ER 12HR, Oral) Active. Vitamin B12 (3000MCG/ML Liquid, Sublingual) Active. Citalopram Hydrobromide (20MG  Tablet, Oral) Active. OLANZapine (5MG  Tablet, Oral) Active. Ipratropium-Albuterol (0.5-2.5 (3)MG/3ML Solution, Inhalation two times daily) Active. Medications Reconciled  Social History [name], [place]; 10/04/2015 10:36 AM) Caffeine use Carbonated beverages, Coffee, Tea. No alcohol use No drug use Tobacco use Former smoker.  Family History [name], [place]; 10/04/2015 10:36 AM) Alcohol Abuse Son. Breast Cancer Sister. Depression Sister. Diabetes Mellitus Father. Heart disease in female family member before age 66 Hypertension Father, Mother.  Pregnancy / Birth History [name], [name]; 10/04/2015 10:36 AM) Age at menarche 15 years. Age of menopause 44-50 Gravida 3 Irregular periods Maternal age 32-20 Para 2     Review of Systems [name] [name]; 10/04/2015 10:36 AM) General Not Present- Appetite Loss, Chills, Fatigue, Fever, Night Sweats, Weight Gain and Weight Loss. Skin Not Present- Change in Wart/Mole, Dryness, Hives, Jaundice, New Lesions, Non-Healing Wounds, Rash and Ulcer. HEENT Not Present- Earache, Hearing Loss, Hoarseness, Nose Bleed, Oral Ulcers, Ringing in the Ears, Seasonal Allergies, Sinus Pain, Sore Throat, Visual Disturbances, Wears glasses/contact lenses and Yellow  Eyes. Respiratory Present- Difficulty Breathing and Wheezing. Not Present- Bloody sputum, Chronic Cough and Snoring. Cardiovascular Present- Shortness of Breath. Not Present- Chest Pain, Difficulty Breathing Lying Down, Leg Cramps, Palpitations,  Rapid Heart Rate and Swelling of Extremities. Gastrointestinal Not Present- Abdominal Pain, Bloating, Bloody Stool, Change in Bowel Habits, Chronic diarrhea, Constipation, Difficulty Swallowing, Excessive gas, Gets full quickly at meals, Hemorrhoids, Indigestion, Nausea, Rectal Pain and Vomiting. Female Genitourinary Not Present- Frequency, Nocturia, Painful Urination, Pelvic Pain and Urgency. Musculoskeletal Not Present- Back Pain, Joint Pain, Joint Stiffness, Muscle Pain, Muscle Weakness and Swelling of Extremities. Neurological Not Present- Decreased Memory, Fainting, Headaches, Numbness, Seizures, Tingling, Tremor, Trouble walking and Weakness. Psychiatric Present- Depression. Not Present- Anxiety, Bipolar, Change in Sleep Pattern, Fearful and Frequent crying. Endocrine Not Present- Cold Intolerance, Excessive Hunger, Hair Changes, Heat Intolerance, Hot flashes and New Diabetes. Hematology Not Present- Easy Bruising, Excessive bleeding, Gland problems, HIV and Persistent Infections.  Vitals [name] [name]; 10/04/2015 10:40 AM) 10/04/2015 10:39 AM Weight: 137.8 lb Height: 62in Body Surface Area: 1.63 m Body Mass Index: 25.2 kg/m  Temp.: 97.56F  Pulse: 68 (Regular)  BP: 118/84 (Sitting, Left Arm, Standard)      Physical Exam (Daymen Hassebrock A. Petrona Wyeth MD; 10/04/2015 12:36 PM)  General Mental Status-Alert. General Appearance-Consistent with stated age. Hydration-Well hydrated. Voice-Normal.  Head and Neck Head-normocephalic, atraumatic with no lesions or palpable masses. Trachea-midline. Thyroid Gland Characteristics - normal size and consistency.  Eye Eyeball - Bilateral-Extraocular movements  intact. Sclera/Conjunctiva - Bilateral-No scleral icterus.  Chest and Lung Exam Chest and lung exam reveals -quiet, even and easy respiratory effort with no use of accessory muscles and on auscultation, normal breath sounds, no adventitious sounds and normal vocal resonance. Inspection Chest Wall - Normal. Back - normal.  Breast Breast - Left-Symmetric, Non Tender, No Biopsy scars, no Dimpling, No Inflammation, No Lumpectomy scars, No Mastectomy scars, No Peau d' Orange. Breast - Right-Symmetric, Non Tender, No Biopsy scars, no Dimpling, No Inflammation, No Lumpectomy scars, No Mastectomy scars, No Peau d' Orange. Breast Lump-No Palpable Breast Mass.  Cardiovascular Cardiovascular examination reveals -normal heart sounds, regular rate and rhythm with no murmurs and normal pedal pulses bilaterally.  Lymphatic Axillary  General Axillary Region: Bilateral - Description - Normal. Tenderness - Non Tender.    Assessment & Plan (Hatice Bubel A. Karren Newland MD; 10/04/2015 12:37 PM)  LEFT BREAST MASS (N63) Impression: Discussed pathology with patient and caregiver today. Discussed rationale for excision of complex sclerosing lesion of left breast. Discussed observation. Discussed small but significant risk of occult malignancy. Risk of lumpectomy include bleeding, infection, seroma, more surgery, use of seed/wire, wound care, cosmetic deformity and the need for other treatments, death , blood clots, death. Pt agrees to proceed.  Current Plans You are being scheduled for surgery - Our schedulers will call you.  You should hear from our office's scheduling department within 5 working days about the location, date, and time of surgery. We try to make accommodations for patient's preferences in scheduling surgery, but sometimes the OR schedule or the surgeon's schedule prevents [place] from making those accommodations.  If you have not heard from our office [phone]) in 5 working days, call the  office and ask for your surgeon's nurse.  If you have other questions about your diagnosis, plan, or surgery, call the office and ask for your surgeon's nurse.  Pt Education - [facility] Breast Biopsy HCI: discussed with patient and provided information.

## 2015-10-11 ENCOUNTER — Encounter (HOSPITAL_BASED_OUTPATIENT_CLINIC_OR_DEPARTMENT_OTHER): Payer: Self-pay | Admitting: *Deleted

## 2015-10-11 ENCOUNTER — Other Ambulatory Visit: Payer: Self-pay | Admitting: Surgery

## 2015-10-11 DIAGNOSIS — N6320 Unspecified lump in the left breast, unspecified quadrant: Secondary | ICD-10-CM

## 2015-10-15 ENCOUNTER — Ambulatory Visit (INDEPENDENT_AMBULATORY_CARE_PROVIDER_SITE_OTHER): Payer: Medicare Other | Admitting: Family

## 2015-10-15 ENCOUNTER — Encounter: Payer: Self-pay | Admitting: Family

## 2015-10-15 VITALS — BP 132/88 | HR 89 | Temp 97.7°F | Resp 18 | Ht 62.0 in | Wt 138.4 lb

## 2015-10-15 DIAGNOSIS — D225 Melanocytic nevi of trunk: Secondary | ICD-10-CM | POA: Diagnosis not present

## 2015-10-15 DIAGNOSIS — E538 Deficiency of other specified B group vitamins: Secondary | ICD-10-CM

## 2015-10-15 DIAGNOSIS — D229 Melanocytic nevi, unspecified: Secondary | ICD-10-CM

## 2015-10-15 NOTE — Patient Instructions (Addendum)
Keep area dry for 24 hours, then you may shower normally. Call if you develop drainage odor or redness from the site. You may use tylenol as needed for pain today.

## 2015-10-15 NOTE — Progress Notes (Signed)
Subjective:    Patient ID: Abigail Castillo, female    DOB: 1950-09-27, 65 y.o.   MRN: [id number]  HPI  Abigail Castillo is a 65 yr old female who presents today for mole removal (chest) and B12 injection.   Review of Systems    see HPI  Past Medical History  Diagnosis Date  . Osteoporosis   . COPD (chronic obstructive pulmonary disease) ([place])   . Tobacco abuse     ongoing  . DEPRESSION 05/31/2009  . Chronic mental illness   . Vitamin D deficiency   . Vitamin B 12 deficiency   . Anxiety   . Emphysema of lung ([place])   . Blood transfusion   . Esophageal stricture 03/14/2015  . Blood transfusion without reported diagnosis   . History of home oxygen therapy      Social History   Social History  . Marital Status: Divorced    Spouse Name: N/A  . Number of Children: 3  . Years of Education: N/A   Occupational History  . disabled    Social History Main Topics  . Smoking status: Former Smoker    Types: Cigarettes    Quit date: 08/12/2011  . Smokeless tobacco: Never Used  . Alcohol Use: No  . Drug Use: No  . Sexual Activity: Not on file   Other Topics Concern  . Not on file   Social History Narrative    Past Surgical History  Procedure Laterality Date  . Cholecystectomy    . Cervical cone biopsy    . Colonoscopy    . Esophagogastroduodenoscopy (egd) with esophageal dilation    . Upper gastrointestinal endoscopy    . Breast surgery  ?2015    lumpectomy, left  . Breast lumpectomy  1972    left breast  . Abdominal hysterectomy      Family History  Problem Relation Age of Onset  . Coronary artery disease Mother   . Diabetes Mother   . Hypertension Mother   . Coronary artery disease Father   . Diabetes Father     Grandfather  . Hypertension Father   . Stroke Father   . Other Father     colostomy bag  . Breast cancer Sister   . Cancer Other     niece--breast  . Diabetes Sister   . Hypertension Sister   . Mental illness Sister     x 4  . Esophageal  cancer Neg Hx   . Stomach cancer Neg Hx   . Rectal cancer Neg Hx   . Colon cancer Neg Hx   . Colon polyps Neg Hx     Allergies  Allergen Reactions  . Sulfonamide Derivatives Hives    Current Outpatient Prescriptions on File Prior to Visit  Medication Sig Dispense Refill  . ALPRAZolam (XANAX) 0.25 MG tablet TAKE ONE TABLET   BY MOUTH   DAILY   AS NEEDED 30 tablet 0  . budesonide-formoterol (SYMBICORT) 160-4.5 MCG/ACT inhaler Inhale 2 puffs into the lungs 2 (two) times daily as needed. 1 Inhaler 3  . Calcium Carbonate-Vitamin D (CALTRATE 600+D) 600-400 MG-UNIT per tablet Take 1 tablet by mouth 2 (two) times daily.    . cholecalciferol (VITAMIN D) 1000 UNITS tablet Take 3,000 Units by mouth daily.     . citalopram (CELEXA) 20 MG tablet TAKE 1 TABLET BY MOUTH ONCE DAILY 30 tablet 5  . dexlansoprazole (DEXILANT) 60 MG capsule Take 60 mg by mouth daily.    Marland Kitchen guaiFENesin ([place])  600 MG 12 hr tablet Take 1,200 mg by mouth 2 (two) times daily as needed for congestion. Reported on 09/11/2015    . ipratropium-albuterol (DUONEB) 0.5-2.5 (3) MG/3ML SOLN Take 3 mLs by nebulization 4 (four) times daily as needed. 360 mL 3  . loperamide (IMODIUM) 2 MG capsule TAKE 1 CAPSULE   (2 MG TOTAL) BY MOUTH AS NEEDED FOR DIARRHEA OR LOOSE STOOLS. 30 capsule 5  . OLANZapine (ZYPREXA) 5 MG tablet TAKE 1 TABLET BY MOUTH DAILY AT BEDTIME 30 tablet 5  . OXYGEN Inhale 3 L into the lungs.    Marland Kitchen PRESCRIPTION MEDICATION B12 inj once monthly    . VENTOLIN HFA 108 (90 Base) MCG/ACT inhaler INHALE TWO   PUFFS INTO THE LUNGS EVERY 6 (SIX) HOURS AS NEEDED FOR WHEEZING OR SHORTNESS OF BREATH. 18 g 5   Current Facility-Administered Medications on File Prior to Visit  Medication Dose Route Frequency Provider Last Rate Last Dose  . cyanocobalamin ((VITAMIN B-12)) injection 1,000 mcg  1,000 mcg Intramuscular Q30 days [name], NP   1,000 mcg at 10/15/15 1150    BP 132/88 mmHg  Pulse 89  Temp(Src) 97.7 F (36.5 C)  (Oral)  Resp 18  Ht 5\' 2"  (1.575 m)  Wt 138 lb 6.4 oz (62.778 kg)  BMI 25.31 kg/m2  SpO2 96%    Objective:   Physical Exam  Constitutional: She is oriented to person, place, and time. She appears well-developed and well-nourished.  Neurological: She is alert and oriented to person, place, and time.  Skin: Skin is warm and dry.  + raised irregular lesion <.5cm noted on right upper chest  Psychiatric: Her behavior is normal. Judgment and thought content normal.          Assessment & Plan:  [place]- Procedure including risks/benefits explained to patient.  Questions were answered. After informed consent was obtained and a time out completed, site was cleansed with betadine and then alcohol. 1% Lidocaine with epinephrine was injected under lesion and then shave biopsy was performed. Area was cauterized to obtain hemostasis.  Pt tolerated procedure well.  Specimen sent for pathology review.  Pt instructed to keep the area dry for 24 hours and to contact us if he develops redness, drainage or swelling at the site.  Pt may use tylenol as needed for discomfort today.

## 2015-10-15 NOTE — Progress Notes (Signed)
Pre visit review using our clinic review tool, if applicable. No additional management support is needed unless otherwise documented below in the visit note. 

## 2015-10-15 NOTE — Assessment & Plan Note (Signed)
b12 injection today.  

## 2015-10-19 ENCOUNTER — Encounter: Payer: Self-pay | Admitting: Family

## 2015-10-31 ENCOUNTER — Ambulatory Visit: Payer: Medicare Other | Admitting: Family

## 2015-10-31 ENCOUNTER — Telehealth: Payer: Self-pay | Admitting: Family

## 2015-10-31 NOTE — Telephone Encounter (Signed)
No charge. 

## 2015-10-31 NOTE — Telephone Encounter (Signed)
Received call that pt is with mgr from [facility] and they are stuck behind a long train and will not make appt today - rescheduled for 6/9 8:45am for their request - charge or no charge?

## 2015-11-02 ENCOUNTER — Ambulatory Visit (INDEPENDENT_AMBULATORY_CARE_PROVIDER_SITE_OTHER): Payer: Medicare Other | Admitting: Family

## 2015-11-02 ENCOUNTER — Telehealth: Payer: Self-pay | Admitting: Cardiology

## 2015-11-02 ENCOUNTER — Telehealth: Payer: Self-pay | Admitting: Family

## 2015-11-02 ENCOUNTER — Ambulatory Visit: Payer: Medicare Other | Admitting: Family

## 2015-11-02 ENCOUNTER — Encounter: Payer: Self-pay | Admitting: Family

## 2015-11-02 ENCOUNTER — Encounter: Payer: Self-pay | Admitting: *Deleted

## 2015-11-02 VITALS — BP 120/80 | HR 78 | Temp 97.5°F | Resp 20 | Ht 62.0 in | Wt 140.6 lb

## 2015-11-02 DIAGNOSIS — K219 Gastro-esophageal reflux disease without esophagitis: Secondary | ICD-10-CM | POA: Diagnosis not present

## 2015-11-02 DIAGNOSIS — J449 Chronic obstructive pulmonary disease, unspecified: Secondary | ICD-10-CM

## 2015-11-02 DIAGNOSIS — F329 Major depressive disorder, single episode, unspecified: Secondary | ICD-10-CM

## 2015-11-02 DIAGNOSIS — F32A Depression, unspecified: Secondary | ICD-10-CM

## 2015-11-02 NOTE — Assessment & Plan Note (Signed)
Stable on PPI 

## 2015-11-02 NOTE — Telephone Encounter (Signed)
Spoke with [name] re: below request. Advised her that I cannot add chronic diarrhea to pt's diagnosis as it was not mentioned in the office note or medical history. PCP did mention fecal incontinence in appeal letter and I can add that dx to the [place]. She will fax copy to me. Hearing / review is on Monday and she will need it for that.

## 2015-11-02 NOTE — Progress Notes (Signed)
Subjective:    Patient ID: Abigail Castillo, female    DOB: 1951/03/20, 65 y.o.   MRN: [id number]  HPI  Abigail Castillo is a 65 yr old female who presents today for follow up.  1) COPD- currently maintained on symbicort, home oxygen, (duonebs/albuterol prn). Does have sob with ambulation.   2) Depression- managed by psych. Maintained on citalopram, zyprexa. Reports good mood.   3) GERD- maintained on dexilant. Reports well controlled sxs.   Pt reports that she needs help at her home with bathing, toileting, incontinence, cutting up foods, personal hygiene.   She did have a breast biopsy which showed a complex sclerosing lesion and caregiver tells me that she is scheduled for a ? Lumpectomy. Dr. Brantley Stage is managing her care. However review of chart appears that she will have a radioactive seed implant.  Review of Systems See HPI  Past Medical History  Diagnosis Date  . Osteoporosis   . COPD (chronic obstructive pulmonary disease) ([place])   . Tobacco abuse     ongoing  . DEPRESSION 05/31/2009  . Chronic mental illness   . Vitamin D deficiency   . Vitamin B 12 deficiency   . Anxiety   . Emphysema of lung ([place])   . Blood transfusion   . Esophageal stricture 03/14/2015  . Blood transfusion without reported diagnosis   . History of home oxygen therapy      Social History   Social History  . Marital Status: Divorced    Spouse Name: N/A  . Number of Children: 3  . Years of Education: N/A   Occupational History  . disabled    Social History Main Topics  . Smoking status: Former Smoker    Types: Cigarettes    Quit date: 08/12/2011  . Smokeless tobacco: Never Used  . Alcohol Use: No  . Drug Use: No  . Sexual Activity: Not on file   Other Topics Concern  . Not on file   Social History Narrative    Past Surgical History  Procedure Laterality Date  . Cholecystectomy    . Cervical cone biopsy    . Colonoscopy    . Esophagogastroduodenoscopy (egd) with esophageal  dilation    . Upper gastrointestinal endoscopy    . Breast surgery  ?2015    lumpectomy, left  . Breast lumpectomy  1972    left breast  . Abdominal hysterectomy      Family History  Problem Relation Age of Onset  . Coronary artery disease Mother   . Diabetes Mother   . Hypertension Mother   . Coronary artery disease Father   . Diabetes Father     Grandfather  . Hypertension Father   . Stroke Father   . Other Father     colostomy bag  . Breast cancer Sister   . Cancer Other     niece--breast  . Diabetes Sister   . Hypertension Sister   . Mental illness Sister     x 4  . Esophageal cancer Neg Hx   . Stomach cancer Neg Hx   . Rectal cancer Neg Hx   . Colon cancer Neg Hx   . Colon polyps Neg Hx     Allergies  Allergen Reactions  . Sulfonamide Derivatives Hives    Current Outpatient Prescriptions on File Prior to Visit  Medication Sig Dispense Refill  . ALPRAZolam (XANAX) 0.25 MG tablet TAKE ONE TABLET   BY MOUTH   DAILY   AS NEEDED 30 tablet  0  . budesonide-formoterol (SYMBICORT) 160-4.5 MCG/ACT inhaler Inhale 2 puffs into the lungs 2 (two) times daily as needed. 1 Inhaler 3  . Calcium Carbonate-Vitamin D (CALTRATE 600+D) 600-400 MG-UNIT per tablet Take 1 tablet by mouth 2 (two) times daily.    . cholecalciferol (VITAMIN D) 1000 UNITS tablet Take 3,000 Units by mouth daily.     . citalopram (CELEXA) 20 MG tablet TAKE 1 TABLET BY MOUTH ONCE DAILY 30 tablet 5  . dexlansoprazole (DEXILANT) 60 MG capsule Take 60 mg by mouth daily.    Marland Kitchen guaiFENesin (MUCINEX) 600 MG 12 hr tablet Take 1,200 mg by mouth 2 (two) times daily as needed for congestion. Reported on 09/11/2015    . ipratropium-albuterol (DUONEB) 0.5-2.5 (3) MG/3ML SOLN Take 3 mLs by nebulization 4 (four) times daily as needed. 360 mL 3  . loperamide (IMODIUM) 2 MG capsule TAKE 1 CAPSULE   (2 MG TOTAL) BY MOUTH AS NEEDED FOR DIARRHEA OR LOOSE STOOLS. 30 capsule 5  . OLANZapine (ZYPREXA) 5 MG tablet TAKE 1 TABLET BY  MOUTH DAILY AT BEDTIME 30 tablet 5  . OXYGEN Inhale 3 L into the lungs.    Marland Kitchen PRESCRIPTION MEDICATION B12 inj once monthly    . VENTOLIN HFA 108 (90 Base) MCG/ACT inhaler INHALE TWO   PUFFS INTO THE LUNGS EVERY 6 (SIX) HOURS AS NEEDED FOR WHEEZING OR SHORTNESS OF BREATH. 18 g 5   Current Facility-Administered Medications on File Prior to Visit  Medication Dose Route Frequency Provider Last Rate Last Dose  . cyanocobalamin ((VITAMIN B-12)) injection 1,000 mcg  1,000 mcg Intramuscular Q30 days [name], NP   1,000 mcg at 10/15/15 1150    BP 120/80 mmHg  Pulse 78  Temp(Src) 97.5 F (36.4 C) (Oral)  Resp 20  Ht 5\' 2"  (1.575 m)  Wt 140 lb 9.6 oz (63.776 kg)  BMI 25.71 kg/m2  SpO2 95%       Objective:   Physical Exam  Constitutional: She is oriented to person, place, and time. She appears well-developed and well-nourished.  HENT:  Head: Normocephalic and atraumatic.  Cardiovascular: Normal rate, regular rhythm and normal heart sounds.   No murmur heard. Pulmonary/Chest: Effort normal and breath sounds normal. No respiratory distress.  Soft wheeze noted, no Increased work of breathing  Neurological: She is alert and oriented to person, place, and time.  Psychiatric: She has a normal mood and affect. Her behavior is normal. Judgment and thought content normal.          Assessment & Plan:

## 2015-11-02 NOTE — Telephone Encounter (Signed)
Caller name: [name], Administrator at [place] be reached: [phone] Fax to [phone]   Reason for call: They need DX on FL2 form as to why pt has chronic diarrhea. Please add to [facility] form and fax it to [place] or send a letter for them to attach to the [facility] form.

## 2015-11-02 NOTE — Assessment & Plan Note (Signed)
Stable, management per psych 

## 2015-11-02 NOTE — Patient Instructions (Signed)
Wear oxygen if you are going to up and walking/active. Keep your upcoming appointment with pulmonology.

## 2015-11-02 NOTE — Progress Notes (Signed)
Pre visit review using our clinic review tool, if applicable. No additional management support is needed unless otherwise documented below in the visit note. 

## 2015-11-02 NOTE — Assessment & Plan Note (Addendum)
Fair control on current meds. She is requesting a rolling cart for her oxygen tank because it is too heavy.  A prescription was provided to the patient and her caregiver today.Advised pt to continue with her oxygen when she is active and PRN and to keep her upcoming appointment with pulmonology.  I also wrote a letter to support her appeal for personal care services.

## 2015-11-02 NOTE — Telephone Encounter (Signed)
New Message   Kim of [facility] requested to speak with RN concerning result of pts heart monitor test. She stated she never received information on if pt needs a f/u appt after getting the monitor removed. Please call back to discuss.

## 2015-11-02 NOTE — Telephone Encounter (Signed)
Diagnosis added and FL2 faxed back to [phone].

## 2015-11-02 NOTE — Telephone Encounter (Addendum)
OK to speak with Maudie Mercury at [facility] per verbal permission from patient.  Also ok to fax monitor information to them. Informed that patient showed primarily sinus rhythm monitor w/ sinus tachycardia. Dr. Curt Bears states patient to be seen as needed, with cardiology, going forward.

## 2015-11-07 ENCOUNTER — Telehealth: Payer: Self-pay | Admitting: *Deleted

## 2015-11-07 NOTE — Telephone Encounter (Signed)
Spoke with Abigail Castillo at [facility] re: need for lab appt for bmet within 30 days of Reclast infusion. Labs must be obtained first then can fax result and order to [id number] ([facility]) and call them for appt. Pt is scheduled for breast lumpectomy on 11/21/15. Lab appt scheduled for 12/03/15 at 11am, lab order entered. Orders in blue folder on my desk.

## 2015-11-13 ENCOUNTER — Encounter (HOSPITAL_COMMUNITY)
Admission: RE | Admit: 2015-11-13 | Discharge: 2015-11-13 | Disposition: A | Discharge status: Home / Self Care | Payer: Medicare Other | Source: Ambulatory Visit | Attending: Surgery | Admitting: Surgery

## 2015-11-13 ENCOUNTER — Encounter (HOSPITAL_COMMUNITY): Payer: Self-pay

## 2015-11-13 VITALS — BP 114/74 | HR 87 | Temp 97.6°F | Resp 20 | Ht 62.0 in | Wt 137.5 lb

## 2015-11-13 DIAGNOSIS — Z7951 Long term (current) use of inhaled steroids: Secondary | ICD-10-CM | POA: Diagnosis not present

## 2015-11-13 DIAGNOSIS — N63 Unspecified lump in breast: Secondary | ICD-10-CM | POA: Insufficient documentation

## 2015-11-13 DIAGNOSIS — Z79899 Other long term (current) drug therapy: Secondary | ICD-10-CM | POA: Insufficient documentation

## 2015-11-13 DIAGNOSIS — Z01818 Encounter for other preprocedural examination: Secondary | ICD-10-CM | POA: Diagnosis not present

## 2015-11-13 DIAGNOSIS — N6320 Unspecified lump in the left breast, unspecified quadrant: Secondary | ICD-10-CM

## 2015-11-13 DIAGNOSIS — J449 Chronic obstructive pulmonary disease, unspecified: Secondary | ICD-10-CM | POA: Insufficient documentation

## 2015-11-13 DIAGNOSIS — Z87891 Personal history of nicotine dependence: Secondary | ICD-10-CM | POA: Insufficient documentation

## 2015-11-13 DIAGNOSIS — Z01812 Encounter for preprocedural laboratory examination: Secondary | ICD-10-CM | POA: Diagnosis not present

## 2015-11-13 HISTORY — DX: Reserved for inherently not codable concepts without codable children: IMO0001

## 2015-11-13 HISTORY — DX: Personal history of pneumonia (recurrent): Z87.01

## 2015-11-13 LAB — BASIC METABOLIC PANEL
ANION GAP: 8 (ref 5–15)
BUN: 8 mg/dL (ref 6–20)
CALCIUM: 9.9 mg/dL (ref 8.9–10.3)
CHLORIDE: 105 mmol/L (ref 101–111)
CO2: 27 mmol/L (ref 22–32)
CREATININE: 0.74 mg/dL (ref 0.44–1.00)
GFR calc Af Amer: >60 mL/min (ref >60)
GFR calc non Af Amer: >60 mL/min (ref >60)
GLUCOSE: 91 mg/dL (ref 65–99)
Potassium: 4.3 mmol/L (ref 3.5–5.1)
Sodium: 140 mmol/L (ref 135–145)

## 2015-11-13 LAB — CBC
HCT: 42.2 % (ref 36.0–46.0)
HEMOGLOBIN: 12.8 g/dL (ref 12.0–15.0)
MCH: 28.4 pg (ref 26.0–34.0)
MCHC: 30.3 g/dL (ref 30.0–36.0)
MCV: 93.8 fL (ref 78.0–100.0)
Platelets: 230 10*3/uL (ref 150–400)
RBC: 4.5 MIL/uL (ref 3.87–5.11)
RDW: 12.7 % (ref 11.5–15.5)
WBC: 6.6 10*3/uL (ref 4.0–10.5)

## 2015-11-13 NOTE — Progress Notes (Signed)
PCP - Dr. [name] Cardiologist - Dr. Curt Bears  Pulmonologist - Dr. Lamonte Sakai  EKG - 08/12/15 CXR - 08/10/15 Echo/stress test/cardiac cath - denies  Patient denies chest pain and acute shortness of breat at PAT appointment.  Patient was not wearing oxygen when she arrived to appointment and stated that walked from [place] parking to PAT without getting short of breath.  Patient informs nurse that she does not wear her oxygen all the time but will wear it when she feels short of breath or if she thinks she might become SOB.    Patient has recently seen Dr. Curt Bears for heart palpitations but denies any recent palpitations.    Caregiver, Judyann Munson, was present for PAT appointment to help answer questions with the patient's permission.  Patient lives at [facility].

## 2015-11-13 NOTE — Pre-Procedure Instructions (Signed)
Abigail Castillo  11/13/2015      [facility] - [place], [place] [address] [place] [address] Phone: [phone] Fax: [phone]    Your procedure is scheduled on Thursday, June 29th, 2017.  Report to [facility] Admitting at 5:30 A.M.   Call this number if you have problems the morning of surgery:  [phone]   Remember:  Do not eat food or drink liquids after midnight.   Take these medicines the morning of surgery with A SIP OF WATER: Alprazolam (Xanax) if needed, Symbicort and Ventolin inhaler if needed (please bring inhalers with you), Citalopram (Celexa), Dexlansoprazole (Dexilant), Duoneb Nebulizer if needed, Loperamide (Imodium) if needed.  7 days prior to surgery, stop taking: Aspirin, NSAIDS, Aleve, Naproxen, Ibuprofen, Advil, Motrin, BC's, Goody's, Fish oil, all herbal medications, and all vitamins.    Do not wear jewelry, make-up or nail polish.  Do not wear lotions, powders, or perfumes.  You may NOT wear deoderant.  Do not shave 48 hours prior to surgery.    Do not bring valuables to the hospital.  [facility] is not responsible for any belongings or valuables.  Contacts, dentures or bridgework may not be worn into surgery.  Leave your suitcase in the car.  After surgery it may be brought to your room.  For patients admitted to the hospital, discharge time will be determined by your treatment team.  Patients discharged the day of surgery will not be allowed to drive home.   Special instructions:  See attached.   Please read over the following fact sheets that you were given.  [place]- Preparing For Surgery  Before surgery, you can play an important role. Because skin is not sterile, your skin needs to be as free of germs as possible. You can reduce the number of germs on your skin by washing with CHG (chlorahexidine gluconate) Soap before surgery.  CHG is an antiseptic cleaner which kills  germs and bonds with the skin to continue killing germs even after washing.  Please do not use if you have an allergy to CHG or antibacterial soaps. If your skin becomes reddened/irritated stop using the CHG.  Do not shave (including legs and underarms) for at least 48 hours prior to first CHG shower. It is OK to shave your face.  Please follow these instructions carefully.   1. Shower the NIGHT BEFORE SURGERY and the MORNING OF SURGERY with CHG.   2. If you chose to wash your hair, wash your hair first as usual with your normal shampoo.  3. After you shampoo, rinse your hair and body thoroughly to remove the shampoo.  4. Use CHG as you would any other liquid soap. You can apply CHG directly to the skin and wash gently with a scrungie or a clean washcloth.   5. Apply the CHG Soap to your body ONLY FROM THE NECK DOWN.  Do not use on open wounds or open sores. Avoid contact with your eyes, ears, mouth and genitals (private parts). Wash genitals (private parts) with your normal soap.  6. Wash thoroughly, paying special attention to the area where your surgery will be performed.  7. Thoroughly rinse your body with warm water from the neck down.  8. DO NOT shower/wash with your normal soap after using and rinsing off the CHG Soap.  9. Pat yourself dry with a CLEAN TOWEL.   10. Wear CLEAN PAJAMAS   11.  Place CLEAN SHEETS on your bed the night of your first shower and DO NOT SLEEP WITH PETS.  Day of Surgery: Do not apply any deodorants/lotions. Please wear clean clothes to the hospital/surgery center.

## 2015-11-14 NOTE — Progress Notes (Signed)
Anesthesia Chart Review:  Pt is a 65 year old female scheduled for L breast lumpectomy with radioactive seed localization on 11/22/2015 with [employer].   Pulmonologist is Baltazar Apo who follows pt's COPD.   PMH includes:  COPD, emphysema. Former smoker. BMI 25  Pt reported at PAT that she uses O2 at home prn. She did not wear oxygen to PAT, O2sats 98/%.   Medications include: symbicort, dexilant, duoneb, albuterol.  Preoperative labs reviewed.    Chest x-ray 08/10/15 reviewed. Emphysematous disease is noted bilaterally. No acute cardiopulmonary abnormality seen.  EKG 08/10/15: Sinus rhythm. Nonspecific repol abnormality, diffuse leads. Baseline wander in lead(s) V5  Cardiac event monitor 09/11/15: Primary rhythm sinus with sinus tachycardia. No arrhythmias noted.   If no changes, I anticipate pt can proceed with surgery as scheduled.   Willeen Cass, FNP-BC [facility]/Anesthesiology Phone: [phone] 11/14/2015 3:43 PM

## 2015-11-15 ENCOUNTER — Ambulatory Visit (INDEPENDENT_AMBULATORY_CARE_PROVIDER_SITE_OTHER): Payer: Medicare Other | Admitting: Behavioral Health

## 2015-11-15 DIAGNOSIS — E538 Deficiency of other specified B group vitamins: Secondary | ICD-10-CM | POA: Diagnosis not present

## 2015-11-15 MED ORDER — CYANOCOBALAMIN 1000 MCG/ML IJ SOLN
1000.0000 ug | Freq: Once | INTRAMUSCULAR | Status: AC
  Administered 2015-11-15: 1000 ug via INTRAMUSCULAR

## 2015-11-15 NOTE — Progress Notes (Signed)
Pre visit review using our clinic review tool, if applicable. No additional management support is needed unless otherwise documented below in the visit note.  Patient in office today for B12 injection. IM given in Left Deltoid. Patient tolerated injection well.  Next appointment scheduled for 12/18/15 at 10:30 AM.

## 2015-11-21 ENCOUNTER — Ambulatory Visit
Admission: RE | Admit: 2015-11-21 | Discharge: 2015-11-21 | Disposition: A | Discharge status: Home / Self Care | Payer: Medicare Other | Source: Ambulatory Visit | Attending: Surgery | Admitting: Surgery

## 2015-11-21 DIAGNOSIS — R928 Other abnormal and inconclusive findings on diagnostic imaging of breast: Secondary | ICD-10-CM | POA: Diagnosis not present

## 2015-11-21 DIAGNOSIS — N6320 Unspecified lump in the left breast, unspecified quadrant: Secondary | ICD-10-CM

## 2015-11-21 MED ORDER — CEFAZOLIN SODIUM-DEXTROSE 2-4 GM/100ML-% IV SOLN
2.0000 g | INTRAVENOUS | Status: AC
  Administered 2015-11-22: 2 g via INTRAVENOUS

## 2015-11-21 NOTE — Anesthesia Preprocedure Evaluation (Signed)
Anesthesia Evaluation  Patient identified by MRN, date of birth, ID band Patient awake    Reviewed: Allergy & Precautions, NPO status , Patient's Chart, lab work & pertinent test results  Airway Mallampati: II  TM Distance: >3 FB Neck ROM: Full    Dental no notable dental hx.    Pulmonary shortness of breath, COPD, former smoker,    Pulmonary exam normal breath sounds clear to auscultation       Cardiovascular negative cardio ROS Normal cardiovascular exam Rhythm:Regular Rate:Normal     Neuro/Psych PSYCHIATRIC DISORDERS Anxiety Depression negative neurological ROS     GI/Hepatic Neg liver ROS, GERD  ,  Endo/Other  negative endocrine ROS  Renal/GU negative Renal ROS     Musculoskeletal negative musculoskeletal ROS (+)   Abdominal   Peds  Hematology negative hematology ROS (+)   Anesthesia Other Findings   Reproductive/Obstetrics negative OB ROS                             Anesthesia Physical Anesthesia Plan  ASA: II  Anesthesia Plan: General   Post-op Pain Management:    Induction: Intravenous  Airway Management Planned: LMA  Additional Equipment:   Intra-op Plan:   Post-operative Plan: Extubation in OR  Informed Consent: I have reviewed the patients History and Physical, chart, labs and discussed the procedure including the risks, benefits and alternatives for the proposed anesthesia with the patient or authorized representative who has indicated his/her understanding and acceptance.   Dental advisory given  Plan Discussed with: CRNA  Anesthesia Plan Comments:         Anesthesia Quick Evaluation

## 2015-11-22 ENCOUNTER — Ambulatory Visit (HOSPITAL_COMMUNITY): Payer: Medicare Other | Admitting: Anesthesiology

## 2015-11-22 ENCOUNTER — Encounter (HOSPITAL_COMMUNITY): Payer: Self-pay | Admitting: Surgery

## 2015-11-22 ENCOUNTER — Ambulatory Visit (HOSPITAL_COMMUNITY)
Admission: RE | Admit: 2015-11-22 | Discharge: 2015-11-22 | Disposition: A | Discharge status: Home / Self Care | Payer: Medicare Other | Source: Ambulatory Visit | Attending: Surgery | Admitting: Surgery

## 2015-11-22 ENCOUNTER — Ambulatory Visit (HOSPITAL_COMMUNITY): Payer: Medicare Other | Admitting: Emergency Medicine

## 2015-11-22 ENCOUNTER — Ambulatory Visit: Payer: Medicare Other | Admitting: Emergency Medicine

## 2015-11-22 ENCOUNTER — Encounter (HOSPITAL_COMMUNITY)
Admission: RE | Disposition: A | Discharge status: Home / Self Care | Payer: Self-pay | Source: Ambulatory Visit | Attending: Surgery

## 2015-11-22 ENCOUNTER — Ambulatory Visit
Admission: RE | Admit: 2015-11-22 | Discharge: 2015-11-22 | Disposition: A | Discharge status: Home / Self Care | Payer: Medicare Other | Source: Ambulatory Visit | Attending: Surgery | Admitting: Surgery

## 2015-11-22 DIAGNOSIS — Z79899 Other long term (current) drug therapy: Secondary | ICD-10-CM | POA: Diagnosis not present

## 2015-11-22 DIAGNOSIS — N6082 Other benign mammary dysplasias of left breast: Secondary | ICD-10-CM | POA: Diagnosis not present

## 2015-11-22 DIAGNOSIS — J449 Chronic obstructive pulmonary disease, unspecified: Secondary | ICD-10-CM | POA: Insufficient documentation

## 2015-11-22 DIAGNOSIS — Z87891 Personal history of nicotine dependence: Secondary | ICD-10-CM | POA: Insufficient documentation

## 2015-11-22 DIAGNOSIS — F418 Other specified anxiety disorders: Secondary | ICD-10-CM | POA: Insufficient documentation

## 2015-11-22 DIAGNOSIS — N6022 Fibroadenosis of left breast: Secondary | ICD-10-CM | POA: Diagnosis not present

## 2015-11-22 DIAGNOSIS — N6489 Other specified disorders of breast: Secondary | ICD-10-CM | POA: Insufficient documentation

## 2015-11-22 DIAGNOSIS — Z803 Family history of malignant neoplasm of breast: Secondary | ICD-10-CM | POA: Diagnosis not present

## 2015-11-22 DIAGNOSIS — N6320 Unspecified lump in the left breast, unspecified quadrant: Secondary | ICD-10-CM

## 2015-11-22 DIAGNOSIS — N6012 Diffuse cystic mastopathy of left breast: Secondary | ICD-10-CM | POA: Diagnosis not present

## 2015-11-22 DIAGNOSIS — N6092 Unspecified benign mammary dysplasia of left breast: Secondary | ICD-10-CM | POA: Insufficient documentation

## 2015-11-22 DIAGNOSIS — R928 Other abnormal and inconclusive findings on diagnostic imaging of breast: Secondary | ICD-10-CM | POA: Diagnosis not present

## 2015-11-22 DIAGNOSIS — R921 Mammographic calcification found on diagnostic imaging of breast: Secondary | ICD-10-CM | POA: Diagnosis not present

## 2015-11-22 DIAGNOSIS — N63 Unspecified lump in breast: Secondary | ICD-10-CM | POA: Diagnosis not present

## 2015-11-22 HISTORY — PX: BREAST LUMPECTOMY WITH RADIOACTIVE SEED LOCALIZATION: SHX6424

## 2015-11-22 HISTORY — DX: Dependence on supplemental oxygen: Z99.81

## 2015-11-22 SURGERY — BREAST LUMPECTOMY WITH RADIOACTIVE SEED LOCALIZATION
Anesthesia: General | Site: Breast | Laterality: Left

## 2015-11-22 MED ORDER — EPHEDRINE SULFATE 50 MG/ML IJ SOLN
INTRAMUSCULAR | Status: DC | PRN
  Administered 2015-11-22 (×3): 10 mg via INTRAVENOUS

## 2015-11-22 MED ORDER — BUPIVACAINE-EPINEPHRINE 0.25% -1:200000 IJ SOLN
INTRAMUSCULAR | Status: DC | PRN
  Administered 2015-11-22: 20 mL

## 2015-11-22 MED ORDER — DEXAMETHASONE SODIUM PHOSPHATE 10 MG/ML IJ SOLN
INTRAMUSCULAR | Status: DC | PRN
  Administered 2015-11-22: 5 mg via INTRAVENOUS

## 2015-11-22 MED ORDER — BUPIVACAINE-EPINEPHRINE (PF) 0.25% -1:200000 IJ SOLN
INTRAMUSCULAR | Status: AC
  Filled 2015-11-22: qty 30

## 2015-11-22 MED ORDER — PROPOFOL 10 MG/ML IV BOLUS
INTRAVENOUS | Status: DC | PRN
  Administered 2015-11-22: 15 mg via INTRAVENOUS

## 2015-11-22 MED ORDER — FENTANYL CITRATE (PF) 250 MCG/5ML IJ SOLN
INTRAMUSCULAR | Status: AC
  Filled 2015-11-22: qty 5

## 2015-11-22 MED ORDER — DEXAMETHASONE SODIUM PHOSPHATE 10 MG/ML IJ SOLN
INTRAMUSCULAR | Status: AC
  Filled 2015-11-22: qty 1

## 2015-11-22 MED ORDER — EPHEDRINE 5 MG/ML INJ
INTRAVENOUS | Status: AC
  Filled 2015-11-22: qty 10

## 2015-11-22 MED ORDER — CEFAZOLIN SODIUM-DEXTROSE 2-4 GM/100ML-% IV SOLN
INTRAVENOUS | Status: AC
  Filled 2015-11-22: qty 100

## 2015-11-22 MED ORDER — FENTANYL CITRATE (PF) 250 MCG/5ML IJ SOLN
INTRAMUSCULAR | Status: DC | PRN
  Administered 2015-11-22: 100 ug via INTRAVENOUS

## 2015-11-22 MED ORDER — MIDAZOLAM HCL 2 MG/2ML IJ SOLN
INTRAMUSCULAR | Status: AC
  Filled 2015-11-22: qty 2

## 2015-11-22 MED ORDER — HYDROMORPHONE HCL 1 MG/ML IJ SOLN: 0.2500 mg | INTRAMUSCULAR | Status: DC | PRN

## 2015-11-22 MED ORDER — ONDANSETRON HCL 4 MG/2ML IJ SOLN
INTRAMUSCULAR | Status: AC
  Filled 2015-11-22: qty 2

## 2015-11-22 MED ORDER — PHENYLEPHRINE 40 MCG/ML (10ML) SYRINGE FOR IV PUSH (FOR BLOOD PRESSURE SUPPORT)
PREFILLED_SYRINGE | INTRAVENOUS | Status: AC
  Filled 2015-11-22: qty 10

## 2015-11-22 MED ORDER — PROPOFOL 10 MG/ML IV BOLUS
INTRAVENOUS | Status: AC
  Filled 2015-11-22: qty 20

## 2015-11-22 MED ORDER — MEPERIDINE HCL 25 MG/ML IJ SOLN: 6.2500 mg | INTRAMUSCULAR | Status: DC | PRN

## 2015-11-22 MED ORDER — LACTATED RINGERS IV SOLN
INTRAVENOUS | Status: DC | PRN
  Administered 2015-11-22: 07:00:00 via INTRAVENOUS

## 2015-11-22 MED ORDER — PHENYLEPHRINE HCL 10 MG/ML IJ SOLN
INTRAMUSCULAR | Status: DC | PRN
  Administered 2015-11-22 (×2): 40 ug via INTRAVENOUS
  Administered 2015-11-22: 120 ug via INTRAVENOUS
  Administered 2015-11-22 (×2): 40 ug via INTRAVENOUS
  Administered 2015-11-22: 80 ug via INTRAVENOUS

## 2015-11-22 MED ORDER — PROMETHAZINE HCL 25 MG/ML IJ SOLN: 6.2500 mg | INTRAMUSCULAR | Status: DC | PRN

## 2015-11-22 MED ORDER — LIDOCAINE HCL (CARDIAC) 20 MG/ML IV SOLN
INTRAVENOUS | Status: DC | PRN
  Administered 2015-11-22: 100 mg via INTRAVENOUS

## 2015-11-22 MED ORDER — HYDROCODONE-ACETAMINOPHEN 5-325 MG PO TABS: 1.0000 | ORAL_TABLET | Freq: Four times a day (QID) | ORAL | Status: DC | PRN

## 2015-11-22 MED ORDER — MIDAZOLAM HCL 2 MG/2ML IJ SOLN
INTRAMUSCULAR | Status: DC | PRN
  Administered 2015-11-22 (×2): 1 mg via INTRAVENOUS

## 2015-11-22 MED ORDER — ONDANSETRON HCL 4 MG/2ML IJ SOLN
INTRAMUSCULAR | Status: DC | PRN
  Administered 2015-11-22: 4 mg via INTRAVENOUS

## 2015-11-22 MED ORDER — CHLORHEXIDINE GLUCONATE 4 % EX LIQD: 1.0000 "application " | Freq: Once | CUTANEOUS | Status: DC

## 2015-11-22 SURGICAL SUPPLY — 47 items
APPLIER CLIP 9.375 MED OPEN (MISCELLANEOUS)
APR CLP MED 9.3 20 MLT OPN (MISCELLANEOUS)
BINDER BREAST LRG (GAUZE/BANDAGES/DRESSINGS) IMPLANT
BINDER BREAST XLRG (GAUZE/BANDAGES/DRESSINGS) IMPLANT
BLADE SURG 15 STRL LF DISP TIS (BLADE) ×1 IMPLANT
BLADE SURG 15 STRL SS (BLADE) ×3
CANISTER SUCTION 2500CC (MISCELLANEOUS) IMPLANT
CHLORAPREP W/TINT 26ML (MISCELLANEOUS) ×3 IMPLANT
CLIP APPLIE 9.375 MED OPEN (MISCELLANEOUS) IMPLANT
COVER PROBE W GEL 5X96 (DRAPES) ×3 IMPLANT
COVER SURGICAL LIGHT HANDLE (MISCELLANEOUS) ×2 IMPLANT
DEVICE DUBIN SPECIMEN MAMMOGRA (MISCELLANEOUS) ×3 IMPLANT
DRAPE CHEST BREAST 15X10 FENES (DRAPES) ×3 IMPLANT
DRAPE UTILITY XL STRL (DRAPES) ×5 IMPLANT
ELECT CAUTERY BLADE 6.4 (BLADE) ×3 IMPLANT
ELECT REM PT RETURN 9FT ADLT (ELECTROSURGICAL) ×3
ELECTRODE REM PT RTRN 9FT ADLT (ELECTROSURGICAL) ×1 IMPLANT
GLOVE BIO SURGEON STRL SZ8 (GLOVE) ×3 IMPLANT
GLOVE BIOGEL PI IND STRL 8 (GLOVE) ×1 IMPLANT
GLOVE BIOGEL PI INDICATOR 8 (GLOVE) ×2
GOWN STRL REUS W/ TWL LRG LVL3 (GOWN DISPOSABLE) ×1 IMPLANT
GOWN STRL REUS W/ TWL XL LVL3 (GOWN DISPOSABLE) ×1 IMPLANT
GOWN STRL REUS W/TWL LRG LVL3 (GOWN DISPOSABLE) ×3
GOWN STRL REUS W/TWL XL LVL3 (GOWN DISPOSABLE) ×3
KIT BASIN OR (CUSTOM PROCEDURE TRAY) ×3 IMPLANT
KIT MARKER MARGIN INK (KITS) ×3 IMPLANT
LIGHT WAVEGUIDE WIDE FLAT (MISCELLANEOUS) ×3 IMPLANT
LIQUID BAND (GAUZE/BANDAGES/DRESSINGS) ×2 IMPLANT
NDL HYPO 25X1 1.5 SAFETY (NEEDLE) ×1 IMPLANT
NEEDLE HYPO 25X1 1.5 SAFETY (NEEDLE) ×3 IMPLANT
NS IRRIG 1000ML POUR BTL (IV SOLUTION) IMPLANT
PACK SURGICAL SETUP 50X90 (CUSTOM PROCEDURE TRAY) ×3 IMPLANT
PENCIL BUTTON HOLSTER BLD 10FT (ELECTRODE) ×3 IMPLANT
SPONGE LAP 18X18 X RAY DECT (DISPOSABLE) ×3 IMPLANT
SUT MNCRL AB 4-0 PS2 18 (SUTURE) ×3 IMPLANT
SUT SILK 2 0 SH (SUTURE) IMPLANT
SUT VIC AB 2-0 SH 27 (SUTURE) ×3
SUT VIC AB 2-0 SH 27XBRD (SUTURE) ×1 IMPLANT
SUT VIC AB 3-0 SH 27 (SUTURE) ×3
SUT VIC AB 3-0 SH 27X BRD (SUTURE) ×1 IMPLANT
SYR BULB 3OZ (MISCELLANEOUS) ×3 IMPLANT
SYR CONTROL 10ML LL (SYRINGE) ×3 IMPLANT
TOWEL OR 17X24 6PK STRL BLUE (TOWEL DISPOSABLE) ×3 IMPLANT
TOWEL OR 17X26 10 PK STRL BLUE (TOWEL DISPOSABLE) ×3 IMPLANT
TUBE CONNECTING 12'X1/4 (SUCTIONS) ×1
TUBE CONNECTING 12X1/4 (SUCTIONS) ×1 IMPLANT
YANKAUER SUCT BULB TIP NO VENT (SUCTIONS) ×2 IMPLANT

## 2015-11-22 NOTE — Transfer of Care (Signed)
Immediate Anesthesia Transfer of Care Note  Patient: Abigail Castillo  Procedure(s) Performed: Procedure(s): LEFT BREAST LUMPECTOMY WITH RADIOACTIVE SEED LOCALIZATION (Left)  Patient Location: PACU  Anesthesia Type:General  Level of Consciousness: sedated  Airway & Oxygen Therapy: Patient Spontanous Breathing and Patient connected to face mask oxygen  Post-op Assessment: Report given to RN and Post -op Vital signs reviewed and stable  Post vital signs: Reviewed and stable  Last Vitals: There were no vitals filed for this visit.  Last Pain: There were no vitals filed for this visit.       Complications: No apparent anesthesia complications

## 2015-11-22 NOTE — Anesthesia Procedure Notes (Signed)
Procedure Name: LMA Insertion Date/Time: 11/22/2015 7:39 AM Performed by: Merdis Delay Pre-anesthesia Checklist: Patient identified, Emergency Drugs available, Suction available, Patient being monitored and Timeout performed Patient Re-evaluated:Patient Re-evaluated prior to inductionOxygen Delivery Method: Circle system utilized Preoxygenation: Pre-oxygenation with 100% oxygen Intubation Type: IV induction LMA: LMA inserted LMA Size: 4.0 Number of attempts: 1 Placement Confirmation: positive ETCO2,  CO2 detector and breath sounds checked- equal and bilateral Dental Injury: Teeth and Oropharynx as per pre-operative assessment

## 2015-11-22 NOTE — Interval H&P Note (Signed)
History and Physical Interval Note:  11/22/2015 7:17 AM  Abigail Castillo  has presented today for surgery, with the diagnosis of left breast mass  The various methods of treatment have been discussed with the patient and family. After consideration of risks, benefits and other options for treatment, the patient has consented to  Procedure(s): LEFT BREAST LUMPECTOMY WITH RADIOACTIVE SEED LOCALIZATION (Left) as a surgical intervention .  The patient's history has been reviewed, patient examined, no change in status, stable for surgery.  I have reviewed the patient's chart and labs.  Questions were answered to the patient's satisfaction.     Langston [place] A.

## 2015-11-22 NOTE — H&P (Signed)
H&P   Abigail Castillo (MR# [id number])      H&P Info    Author Note Status Last Update User Last Update Date/Time   Erroll Luna, MD Signed Erroll Luna, MD     H&P    Expand All Collapse All   Abigail Castillo 10/04/2015 10:36 AM Location: [facility] Patient #: [id number] DOB: 14-Jun-1950 Divorced / Language: Cleophus Molt / Race: White Female  History of Present Illness Abigail Moores A. Tamila Gaulin MD; Patient words: Patient sent at the request of the [facility]  due to left breast mammographic abnormality. This was core biopsied and found to be consistent with a complex sclerosing lesion. Patient denies any history of breast pain, nipple discharge, breast mass or family history of breast cancer.                  Diagnosis Breast, left, needle core biopsy, 10:00 o'clock - COMPLEX SCLEROSING LESION. - FIBROCYSTIC CHANGES WITH ADENOSIS AND CALCIFICATIONS. - SEE COMMENT.   ADDENDUM REPORT: 09/18/2015 10:59 ADDENDUM: Pathology revealed COMPLEX SCLEROSING LESION, FIBROCYSTIC CHANGES WITH ADENOSIS AND CALCIFICATIONS of the Left breast at the 10:00 o'clock location, with excision recommended. The differential diagnosis of the complex sclerosing lesion includes a radial scar. This was found to be concordant by Dr. Franki Castillo. Pathology results were discussed by phone with Abigail Castillo, Administrator at [facility], the patient has dementia. Abigail Castillo reported the patient did well after the biopsy. Post biopsy instructions and care were reviewed and questions were answered. Abigail Castillo was encouraged to call [facility] for any additional concerns. Surgical consultation has been arranged with Dr. Erroll Luna at [facility] on Oct 04, 2015. Pathology results reported by Abigail Purser, RN on 09/18/2015. Electronically Signed By: Abigail Castillo M.D. On: 09/18/2015 10:59.  The patient is a 65 year old  female.   Other Problems [name], [name];  Chronic Obstructive Lung Disease Depression Oophorectomy  Past Surgical History [name], [name];  Breast Biopsy Left. Hysterectomy (not due to cancer) - Complete Oral Surgery  Diagnostic Studies History [name], [name];  Colonoscopy 1-5 years ago Mammogram within last year  Allergies [name], [name];  Sulfonamide Derivatives  Medication History [name], [name];  Loperamide HCl (2MG  Capsule, Oral) Active. Ventolin HFA (108 (90 Base)MCG/ACT Aerosol Soln, Inhalation) Active. Symbicort (160-4.5MCG/ACT Aerosol, Inhalation) Active. Dexlansoprazole (60MG  Capsule DR, Oral) Active. Mucinex (600MG  Tablet ER 12HR, Oral) Active. Vitamin B12 (3000MCG/ML Liquid, Sublingual) Active. Citalopram Hydrobromide (20MG  Tablet, Oral) Active. OLANZapine (5MG  Tablet, Oral) Active. Ipratropium-Albuterol (0.5-2.5 (3)MG/3ML Solution, Inhalation two times daily) Active. Medications Reconciled  Social History [name], [place];  Caffeine use Carbonated beverages, Coffee, Tea. No alcohol use No drug use Tobacco use Former smoker.  Family History [name], [place];  Alcohol Abuse Son. Breast Cancer Sister. Depression Sister. Diabetes Mellitus Father. Heart disease in female family member before age 59 Hypertension Father, Mother.  Pregnancy / Birth History [name], [place]; Age at menarche 47 years. Age of menopause 57-50 Gravida 3 Irregular periods Maternal age 55-20 Para 2     Review of Systems [name] [name];  General Not Present- Appetite Loss, Chills, Fatigue, Fever, Night Sweats, Weight Gain and Weight Loss. Skin Not Present- Change in Wart/Mole, Dryness, Hives, Jaundice, New Lesions, Non-Healing Wounds, Rash and Ulcer. HEENT Not Present- Earache, Hearing Loss, Hoarseness, Nose Bleed, Oral Ulcers, Ringing in the Ears, Seasonal Allergies, Sinus Pain, Sore Throat, Visual Disturbances, Wears  glasses/contact lenses and Yellow Eyes. Respiratory Present-  Difficulty Breathing and Wheezing. Not Present- Bloody sputum, Chronic Cough and Snoring. Cardiovascular Present- Shortness of Breath. Not Present- Chest Pain, Difficulty Breathing Lying Down, Leg Cramps, Palpitations, Rapid Heart Rate and Swelling of Extremities. Gastrointestinal Not Present- Abdominal Pain, Bloating, Bloody Stool, Change in Bowel Habits, Chronic diarrhea, Constipation, Difficulty Swallowing, Excessive gas, Gets full quickly at meals, Hemorrhoids, Indigestion, Nausea, Rectal Pain and Vomiting. Female Genitourinary Not Present- Frequency, Nocturia, Painful Urination, Pelvic Pain and Urgency. Musculoskeletal Not Present- Back Pain, Joint Pain, Joint Stiffness, Muscle Pain, Muscle Weakness and Swelling of Extremities. Neurological Not Present- Decreased Memory, Fainting, Headaches, Numbness, Seizures, Tingling, Tremor, Trouble walking and Weakness. Psychiatric Present- Depression. Not Present- Anxiety, Bipolar, Change in Sleep Pattern, Fearful and Frequent crying. Endocrine Not Present- Cold Intolerance, Excessive Hunger, Hair Changes, Heat Intolerance, Hot flashes and New Diabetes. Hematology Not Present- Easy Bruising, Excessive bleeding, Gland problems, HIV and Persistent Infections.  Vitals [name] [name];  10/04/2015 10:39 AM Weight: 137.8 lb Height: 62in Body Surface Area: 1.63 m Body Mass Index: 25.2 kg/m  Temp.: 97.68F  Pulse: 68 (Regular)  BP: 118/84 (Sitting, Left Arm, Standard)      Physical Exam  General Mental Status-Alert. General Appearance-Consistent with stated age. Hydration-Well hydrated. Voice-Normal.  Head and Neck Head-normocephalic, atraumatic with no lesions or palpable masses. Trachea-midline. Thyroid Gland Characteristics - normal size and consistency.  Eye Eyeball - Bilateral-Extraocular movements intact. Sclera/Conjunctiva - Bilateral-No scleral  icterus.  Chest and Lung Exam Chest and lung exam reveals -quiet, even and easy respiratory effort with no use of accessory muscles and on auscultation, normal breath sounds, no adventitious sounds and normal vocal resonance. Inspection Chest Wall - Normal. Back - normal.  Breast Breast - Left-Symmetric, Non Tender, No Biopsy scars, no Dimpling, No Inflammation, No Lumpectomy scars, No Mastectomy scars, No Peau d' Orange. Breast - Right-Symmetric, Non Tender, No Biopsy scars, no Dimpling, No Inflammation, No Lumpectomy scars, No Mastectomy scars, No Peau d' Orange. Breast Lump-No Palpable Breast Mass.  Cardiovascular Cardiovascular examination reveals -normal heart sounds, regular rate and rhythm with no murmurs and normal pedal pulses bilaterally.  Lymphatic Axillary  General Axillary Region: Bilateral - Description - Normal. Tenderness - Non Tender.    Assessment & Plan (Bonita Brindisi A. Taniyah Ballow MD;   LEFT BREAST MASS (N63) Impression: Discussed pathology with patient and caregiver today. Discussed rationale for excision of complex sclerosing lesion of left breast. Discussed observation. Discussed small but significant risk of occult malignancy. Risk of lumpectomy include bleeding, infection, seroma, more surgery, use of seed/wire, wound care, cosmetic deformity and the need for other treatments, death , blood clots, death. Pt agrees to proceed.  Current Plans You are being scheduled for surgery - Our schedulers will call you.  You should hear from our office's scheduling department within 5 working days about the location, date, and time of surgery. We try to make accommodations for patient's preferences in scheduling surgery, but sometimes the OR schedule or the surgeon's schedule prevents [place] from making those accommodations.  If you have not heard from our office [phone]) in 5 working days, call the office and ask for your surgeon's nurse.  If you have other questions  about your diagnosis, plan, or surgery, call the office and ask for your surgeon's nurse.  Pt Education - [facility] Breast Biopsy HCI: discussed with patient and provided information.

## 2015-11-22 NOTE — Anesthesia Postprocedure Evaluation (Signed)
Anesthesia Post Note  Patient: Abigail Castillo  Procedure(s) Performed: Procedure(s) (LRB): LEFT BREAST LUMPECTOMY WITH RADIOACTIVE SEED LOCALIZATION (Left)  Patient location during evaluation: PACU Anesthesia Type: General Level of consciousness: sedated and patient cooperative Pain management: pain level controlled Vital Signs Assessment: post-procedure vital signs reviewed and stable Respiratory status: spontaneous breathing Cardiovascular status: stable Anesthetic complications: no    Last Vitals:  Filed Vitals:   11/22/15 0920 11/22/15 0924  BP:  130/98  Pulse:  103  Temp: 36.4 C   Resp:  16    Last Pain: There were no vitals filed for this visit.               Nolon Nations

## 2015-11-22 NOTE — Op Note (Signed)
Peoperative diagnosis: Left breast complex sclerosing lesion   Postoperative diagnosis: Same   Procedure: Left breast seed localized lumpectomy  Surgeon: Erroll Luna M.D.  Anesthesia: Gen. With 0.25% Sensorcaine local with epinephrine   EBL: 20 cc  Specimen: Left breast tissue with clip and radioactive seed in the specimen. Verified with neoprobe and radiographic image showing both seed and clip in specimen  Indications for procedure: The patient presents for left breast  lumpectomy after core biopsy showed CSL . Discussed the rationale for considering excision. Small risk of malignancy associated with  lesion after core biopsy. Discussed observation. Discussed wire localization AND SEED. Patient desired excision of left breast CSL.The procedure has been discussed with the patient. Alternatives to surgery have been discussed with the patient.  Risks of surgery include bleeding,  Infection,  Seroma formation, death,  and the need for further surgery.   The patient understands and wishes to proceed.   Description of procedure: Patient underwent seed placement as an outpatient. Patient presents today for left breast seed localized lumpectomy. Patient and holding area. Questions are answered and neoprobe used to verify seed location. Patient taken back to the operating room and placed upon the OR table. After induction of general anesthesia, left breast prepped and draped in a sterile fashion. Timeout was done to verify proper sizing procedure. Neoprobe used and hot spot identified and left breast upper-outer quadrant. This was marked with pen. Curvilinear incision made left upper INNER  quadrant breast. Dissection used with the help of a neoprobe around the tissue where the seed and clip were located. Tissue removed in its entirety with gross negative  margins.. Neoprobe used and seed within specimen. Radiographs taken which show clip and seed  In specimen.hemostasis achieved and cavity closed with  3-0 Vicryl and 4-0 Monocryl. Liquid adhesive  applied. All final counts found to be correct. Specimen transported to pathology. Patient awoke extubated taken to recovery in satisfactory condition.

## 2015-11-22 NOTE — Discharge Instructions (Signed)
[facility] °Office Phone Number [phone] ° °BREAST BIOPSY/ PARTIAL MASTECTOMY: POST OP INSTRUCTIONS ° °Always review your discharge instruction sheet given to you by the facility where your surgery was performed. ° °IF YOU HAVE DISABILITY OR FAMILY LEAVE FORMS, YOU MUST BRING THEM TO THE OFFICE FOR PROCESSING.  DO NOT GIVE THEM TO YOUR DOCTOR. ° °1. A prescription for pain medication may be given to you upon discharge.  Take your pain medication as prescribed, if needed.  If narcotic pain medicine is not needed, then you may take acetaminophen (Tylenol) or ibuprofen (Advil) as needed. °2. Take your usually prescribed medications unless otherwise directed °3. If you need a refill on your pain medication, please contact your pharmacy.  They will contact our office to request authorization.  Prescriptions will not be filled after 5pm or on week-ends. °4. You should eat very light the first 24 hours after surgery, such as soup, crackers, pudding, etc.  Resume your normal diet the day after surgery. °5. Most patients will experience some swelling and bruising in the breast.  Ice packs and a good support bra will help.  Swelling and bruising can take several days to resolve.  °6. It is common to experience some constipation if taking pain medication after surgery.  Increasing fluid intake and taking a stool softener will usually help or prevent this problem from occurring.  A mild laxative (Milk of Magnesia or Miralax) should be taken according to package directions if there are no bowel movements after 48 hours. °7. Unless discharge instructions indicate otherwise, you may remove your bandages 24-48 hours after surgery, and you may shower at that time.  You may have steri-strips (small skin tapes) in place directly over the incision.  These strips should be left on the skin for 7-10 days.  If your surgeon used skin glue on the incision, you may shower in 24 hours.  The glue will flake off over the  next 2-3 weeks.  Any sutures or staples will be removed at the office during your follow-up visit. °8. ACTIVITIES:  You may resume regular daily activities (gradually increasing) beginning the next day.  Wearing a good support bra or sports bra minimizes pain and swelling.  You may have sexual intercourse when it is comfortable. °a. You may drive when you no longer are taking prescription pain medication, you can comfortably wear a seatbelt, and you can safely maneuver your car and apply brakes. °b. RETURN TO WORK:  ______________________________________________________________________________________ °9. You should see your doctor in the office for a follow-up appointment approximately two weeks after your surgery.  Your doctor’s nurse will typically make your follow-up appointment when she calls you with your pathology report.  Expect your pathology report 2-3 business days after your surgery.  You may call to check if you do not hear from us after three days. °10. OTHER INSTRUCTIONS: _______________________________________________________________________________________________ _____________________________________________________________________________________________________________________________________ °_____________________________________________________________________________________________________________________________________ °_____________________________________________________________________________________________________________________________________ ° °WHEN TO CALL YOUR DOCTOR: °1. Fever over 101.0 °2. Nausea and/or vomiting. °3. Extreme swelling or bruising. °4. Continued bleeding from incision. °5. Increased pain, redness, or drainage from the incision. ° °The clinic staff is available to answer your questions during regular business hours.  Please don’t hesitate to call and ask to speak to one of the nurses for clinical concerns.  If you have a medical emergency, go to the nearest  emergency room or call 911.  A surgeon from [facility] is always on call at the hospital. ° °For further questions, please visit [email or web address]  °

## 2015-11-23 ENCOUNTER — Encounter (HOSPITAL_COMMUNITY): Payer: Self-pay | Admitting: Surgery

## 2015-12-03 ENCOUNTER — Other Ambulatory Visit (INDEPENDENT_AMBULATORY_CARE_PROVIDER_SITE_OTHER): Payer: Medicare Other

## 2015-12-03 DIAGNOSIS — E785 Hyperlipidemia, unspecified: Secondary | ICD-10-CM

## 2015-12-03 LAB — BASIC METABOLIC PANEL
BUN: 9 mg/dL (ref 6–23)
CHLORIDE: 105 meq/L (ref 96–112)
CO2: 28 meq/L (ref 19–32)
CREATININE: 0.77 mg/dL (ref 0.40–1.20)
Calcium: 9.6 mg/dL (ref 8.4–10.5)
GFR: 80.29 mL/min (ref >60.00)
Glucose, Bld: 139 mg/dL — ABNORMAL HIGH (ref 70–99)
POTASSIUM: 3.6 meq/L (ref 3.5–5.1)
Sodium: 142 mEq/L (ref 135–145)

## 2015-12-05 ENCOUNTER — Other Ambulatory Visit: Payer: Self-pay | Admitting: *Deleted

## 2015-12-05 NOTE — Telephone Encounter (Addendum)
[address] [place] @ 12:00pm @ [phone]) asking Lovey Newcomer to RTC regarding appt for the pt for the Reclast infusion.//AB/[name]    Lab results received and Reclast physician's order form completed and faxed to Karena Addison) at [facility].  Pt was scheduled for Reclast infusion for (Tues.-12/25/15 @ 1:00pm).  Pt is to arrive at 12:45pm at the main entrance at [facility] and report to admitting to the (R).//AB/[name]

## 2015-12-18 ENCOUNTER — Ambulatory Visit: Payer: Medicare Other

## 2015-12-21 ENCOUNTER — Ambulatory Visit (INDEPENDENT_AMBULATORY_CARE_PROVIDER_SITE_OTHER): Payer: Medicare Other

## 2015-12-21 DIAGNOSIS — E538 Deficiency of other specified B group vitamins: Secondary | ICD-10-CM

## 2015-12-21 MED ORDER — CYANOCOBALAMIN 1000 MCG/ML IJ SOLN
1000.0000 ug | Freq: Once | INTRAMUSCULAR | Status: AC
  Administered 2015-12-21: 1000 ug via INTRAMUSCULAR

## 2015-12-21 NOTE — Progress Notes (Signed)
Pre visit review using our clinic tool,if applicable. No additional management support is needed unless otherwise documented below in the visit note.   Patient in for B12 injection due to B12 deficiency. Given IM Right Deltoid. Patient tolerated well.

## 2015-12-25 ENCOUNTER — Ambulatory Visit (HOSPITAL_COMMUNITY)
Admission: RE | Admit: 2015-12-25 | Discharge: 2015-12-25 | Disposition: A | Discharge status: Home / Self Care | Payer: Medicare Other | Source: Ambulatory Visit | Attending: Family | Admitting: Family

## 2015-12-26 DIAGNOSIS — F259 Schizoaffective disorder, unspecified: Secondary | ICD-10-CM | POA: Diagnosis not present

## 2016-01-01 ENCOUNTER — Telehealth: Payer: Self-pay | Admitting: Family

## 2016-01-01 DIAGNOSIS — J449 Chronic obstructive pulmonary disease, unspecified: Secondary | ICD-10-CM

## 2016-01-01 NOTE — Telephone Encounter (Signed)
[place] [phone]  Pt missed appt at [facility] for her infusion due to her having oxygen concerns. They would like to have pt rescheduled.    Please advise

## 2016-01-01 NOTE — Telephone Encounter (Signed)
Kim called back in to request orders for a walker/ feeder for pt. She says that the pt has a hard time walking at times.

## 2016-01-01 NOTE — Telephone Encounter (Signed)
Previous lab is good through 01/03/16. Attempted to reach facility to see if they could transport pt if we could r/s in the next 1-2 days and received fax tone x 2. Will probably need to reorder lab and r/s after new lab completed. Also need clarification re: walker/feeder.

## 2016-01-07 ENCOUNTER — Other Ambulatory Visit: Payer: Self-pay | Admitting: *Deleted

## 2016-01-07 DIAGNOSIS — Z5181 Encounter for therapeutic drug level monitoring: Secondary | ICD-10-CM

## 2016-01-07 NOTE — Telephone Encounter (Signed)
Attempted to reach facility re: walker / feeder request and received no answer.

## 2016-01-07 NOTE — Telephone Encounter (Signed)
Attempted to reach group home and received fax tone. Tried again and received busy signal.

## 2016-01-07 NOTE — Telephone Encounter (Signed)
Called [facility]. Spoke w/ Maudie Mercury and scheduled lab appt for 01/08/16, future orders placed. Pt had B12 injection scheduled 01/08/16, however is not due for injection until 01/21/16. B12 injection rescheduled for 01/12/16.

## 2016-01-08 ENCOUNTER — Ambulatory Visit: Payer: Medicare Other

## 2016-01-09 ENCOUNTER — Other Ambulatory Visit: Payer: Medicare Other

## 2016-01-11 ENCOUNTER — Other Ambulatory Visit: Payer: Medicare Other

## 2016-01-14 ENCOUNTER — Other Ambulatory Visit: Payer: Self-pay | Admitting: Emergency Medicine

## 2016-01-14 ENCOUNTER — Ambulatory Visit (INDEPENDENT_AMBULATORY_CARE_PROVIDER_SITE_OTHER): Payer: Medicare Other | Admitting: Emergency Medicine

## 2016-01-14 ENCOUNTER — Encounter: Payer: Self-pay | Admitting: Emergency Medicine

## 2016-01-14 VITALS — BP 124/74 | HR 83 | Ht 62.0 in | Wt 141.0 lb

## 2016-01-14 DIAGNOSIS — R0600 Dyspnea, unspecified: Secondary | ICD-10-CM

## 2016-01-14 DIAGNOSIS — J449 Chronic obstructive pulmonary disease, unspecified: Secondary | ICD-10-CM | POA: Diagnosis not present

## 2016-01-14 LAB — PULMONARY FUNCTION TEST
FEF 25-75 PRE: 0.16 L/s
FEF 25-75 Post: 0.23 L/sec
FEF2575-%CHANGE-POST: 37 %
FEF2575-%Pred-Post: 10 %
FEF2575-%Pred-Pre: 7 %
FEV1-%Change-Post: 15 %
FEV1-%PRED-POST: 19 %
FEV1-%Pred-Pre: 17 %
FEV1-PRE: 0.39 L
FEV1-Post: 0.45 L
FEV1FVC-%CHANGE-POST: 0 %
FEV1FVC-%Pred-Pre: 44 %
FEV6-%CHANGE-POST: 19 %
FEV6-%PRED-POST: 44 %
FEV6-%PRED-PRE: 37 %
FEV6-PRE: 1.06 L
FEV6-Post: 1.27 L
FEV6FVC-%Change-Post: 3 %
FEV6FVC-%PRED-PRE: 96 %
FEV6FVC-%Pred-Post: 99 %
FVC-%CHANGE-POST: 15 %
FVC-%PRED-POST: 44 %
FVC-%Pred-Pre: 38 %
FVC-Post: 1.32 L
FVC-Pre: 1.14 L
POST FEV6/FVC RATIO: 96 %
Post FEV1/FVC ratio: 34 %
Pre FEV1/FVC ratio: 34 %
Pre FEV6/FVC Ratio: 93 %

## 2016-01-14 MED ORDER — TIOTROPIUM BROMIDE-OLODATEROL 2.5-2.5 MCG/ACT IN AERS: 2.0000 | INHALATION_SPRAY | Freq: Every day | RESPIRATORY_TRACT | 5 refills | Status: DC

## 2016-01-14 MED ORDER — TIOTROPIUM BROMIDE-OLODATEROL 2.5-2.5 MCG/ACT IN AERS: 2.0000 | INHALATION_SPRAY | Freq: Every day | RESPIRATORY_TRACT | 0 refills | Status: DC

## 2016-01-14 NOTE — Assessment & Plan Note (Signed)
Please STOP SYMBICORT We will start Stiolto 2 puffs once a day, every day Take albuterol 2 puffs up to every 4 hours if needed for shortness of breath.  We will try to obtain a portable oxygen concentrator through [facility] Continue your oxygen at 2L/min Follow with Dr Lamonte Sakai in 2 months or sooner if you have any problems.

## 2016-01-14 NOTE — Patient Instructions (Signed)
Please STOP SYMBICORT We will start Stiolto 2 puffs once a day, every day Take albuterol 2 puffs up to every 4 hours if needed for shortness of breath.  We will try to obtain a portable oxygen concentrator through [facility] Continue your oxygen at 2L/min Follow with Dr Lamonte Sakai in 2 months or sooner if you have any problems.

## 2016-01-14 NOTE — Progress Notes (Signed)
Subjective:    Patient ID: Abigail Castillo, female    DOB: Jun 23, 1950, 65 y.o.   MRN: [id number]  HPI 65 year old former smoker with a documented history of COPD made years ago, believes that she had spirometry a long time ago, cant recall where. Also with a history of osteoporosis and depression, B12 deficiency. She is followed by [facility] primary care. She was seen in ED 3/17 for an AE-COPD, was treated with pred. She states that she exacerbates frequently. She is on Symbicort, has used it for years. Uses SABA at least once a day. She typically has daily cough, has been better lately. Has exertional SOB, is limited by her breathing.   [name] 01/14/16 -- Follow-up visit for former tobacco use and COPD. I saw her in April 2017 and she was on Symbicort. We have performed pulmonary function testing today and I have reviewed the results. She has very severe obstruction without a bronchodilator response. She was unable to do the diffusion capacity or the lung volumes.  She is only using Symbicort prn, albuterol prn. She has minimal cough, prod of phlegm. Occasional wheeze. No flares since last time.    Review of Systems  Constitutional: Negative for fever and unexpected weight change.  HENT: Negative for congestion, dental problem, ear pain, nosebleeds, postnasal drip, rhinorrhea, sinus pressure, sneezing, sore throat and trouble swallowing.   Eyes: Negative for redness and itching.  Respiratory: Positive for cough, shortness of breath and wheezing. Negative for chest tightness.   Cardiovascular: Negative for palpitations and leg swelling.  Gastrointestinal: Negative for nausea and vomiting.  Genitourinary: Negative for dysuria.  Musculoskeletal: Negative for joint swelling.  Skin: Negative for rash.  Neurological: Negative for headaches.  Hematological: Does not bruise/bleed easily.  Psychiatric/Behavioral: Negative for dysphoric mood. The patient is not nervous/anxious.     Past Medical History:    Diagnosis Date  . Anxiety   . Blood transfusion   . Blood transfusion without reported diagnosis   . Chronic mental illness   . COPD (chronic obstructive pulmonary disease) ([place])   . DEPRESSION 05/31/2009  . Emphysema of lung ([place])   . Esophageal stricture 03/14/2015  . History of home oxygen therapy   . History of pneumonia   . Osteoporosis   . Shortness of breath dyspnea   . Tobacco abuse    ongoing  . Vitamin B 12 deficiency   . Vitamin D deficiency      Family History  Problem Relation Age of Onset  . Coronary artery disease Mother   . Diabetes Mother   . Hypertension Mother   . Coronary artery disease Father   . Diabetes Father     Grandfather  . Hypertension Father   . Stroke Father   . Other Father     colostomy bag  . Breast cancer Sister   . Cancer Other     niece--breast  . Diabetes Sister   . Hypertension Sister   . Mental illness Sister     x 4  . Esophageal cancer Neg Hx   . Stomach cancer Neg Hx   . Rectal cancer Neg Hx   . Colon cancer Neg Hx   . Colon polyps Neg Hx      Social History   Social History  . Marital status: Divorced    Spouse name: N/A  . Number of children: 3  . Years of education: N/A   Occupational History  . disabled Unemployed   Social History Main Topics  .  Smoking status: Former Smoker    Types: Cigarettes    Quit date: 08/12/2011  . Smokeless tobacco: Never Used  . Alcohol use No  . Drug use: No  . Sexual activity: Not on file   Other Topics Concern  . Not on file   Social History Narrative  . No narrative on file     Allergies  Allergen Reactions  . Sulfonamide Derivatives Hives     Outpatient Medications Prior to Visit  Medication Sig Dispense Refill  . ALPRAZolam (XANAX) 0.25 MG tablet TAKE ONE TABLET   BY MOUTH   DAILY   AS NEEDED 30 tablet 0  . budesonide-formoterol (SYMBICORT) 160-4.5 MCG/ACT inhaler Inhale 2 puffs into the lungs 2 (two) times daily as needed. 1 Inhaler 3  . Calcium  Carbonate-Vitamin D (CALTRATE 600+D) 600-400 MG-UNIT per tablet Take 1 tablet by mouth 2 (two) times daily.    . cholecalciferol (VITAMIN D) 1000 UNITS tablet Take 3,000 Units by mouth daily.     . citalopram (CELEXA) 20 MG tablet TAKE 1 TABLET BY MOUTH ONCE DAILY 30 tablet 5  . Cyanocobalamin (VITAMIN B-12 IJ) Inject 1 Dose as directed every 30 (thirty) days.    Marland Kitchen dexlansoprazole (DEXILANT) 60 MG capsule Take 60 mg by mouth daily.    Marland Kitchen guaiFENesin (MUCINEX) 600 MG 12 hr tablet Take 1,200 mg by mouth 2 (two) times daily as needed for congestion. Reported on 09/11/2015    . HYDROcodone-acetaminophen (NORCO) 5-325 MG tablet Take 1-2 tablets by mouth every 6 (six) hours as needed for moderate pain. 30 tablet 0  . ipratropium-albuterol (DUONEB) 0.5-2.5 (3) MG/3ML SOLN Take 3 mLs by nebulization 4 (four) times daily as needed. 360 mL 3  . loperamide (IMODIUM) 2 MG capsule TAKE 1 CAPSULE   (2 MG TOTAL) BY MOUTH AS NEEDED FOR DIARRHEA OR LOOSE STOOLS. 30 capsule 5  . OLANZapine (ZYPREXA) 5 MG tablet TAKE 1 TABLET BY MOUTH DAILY AT BEDTIME 30 tablet 5  . OXYGEN Inhale 3 L into the lungs.    . VENTOLIN HFA 108 (90 Base) MCG/ACT inhaler INHALE TWO   PUFFS INTO THE LUNGS EVERY 6 (SIX) HOURS AS NEEDED FOR WHEEZING OR SHORTNESS OF BREATH. 18 g 5   Facility-Administered Medications Prior to Visit  Medication Dose Route Frequency Provider Last Rate Last Dose  . cyanocobalamin ((VITAMIN B-12)) injection 1,000 mcg  1,000 mcg Intramuscular Q30 days [name], NP   1,000 mcg at 10/15/15 1150         Objective:   Physical Exam  Vitals:   01/14/16 1207 01/14/16 1211  BP:  124/74  Pulse:  83  SpO2:  98%  Weight: 141 lb (64 kg)   Height: 5\' 2"  (1.575 m)    Gen: Pleasant, well-nourished, in no distress  ENT: No lesions,  Edentulous, mouth clear,  oropharynx clear, no postnasal drip  Neck: No JVD, no TMG, no carotid bruits  Lungs: No use of accessory muscles,  clear without rales or  rhonchi  Cardiovascular: RRR, heart sounds normal, no murmur or gallops, no peripheral edema  Musculoskeletal: No deformities, no cyanosis or clubbing  Neuro: alert, non focal, oriented but fairly simple responses, poor recall of her med and history  Skin: Warm, no lesions or rashes       Assessment & Plan:  COPD (chronic obstructive pulmonary disease) (HCC) Please STOP SYMBICORT We will start Stiolto 2 puffs once a day, every day Take albuterol 2 puffs up to every 4 hours if  needed for shortness of breath.  We will try to obtain a portable oxygen concentrator through [facility] Continue your oxygen at 2L/min Follow with Dr Lamonte Sakai in 2 months or sooner if you have any problems.  Baltazar Apo, MD, PhD 01/14/2016, 12:30 PM [facility] [phone] or if no answer [phone]

## 2016-01-14 NOTE — Progress Notes (Signed)
Patient seen in the office today and instructed on use of Stiolto.  Patient expressed understanding and demonstrated technique. 

## 2016-01-15 ENCOUNTER — Telehealth: Payer: Self-pay | Admitting: *Deleted

## 2016-01-15 NOTE — Telephone Encounter (Signed)
Key : [id number]  Your information has been submitted to [place] [employer] Part D. [employer] will review the request and will issue a decision, typically within 1-3 days from your submission. You can check the updated outcome later by reopening this request. If [employer] has not responded in 1-3 days or if you have any questions about your ePA request, please contact [place] Medicare Part D at [phone]. If you think there may be a problem with your PA request, use our live chat feature at the bottom right --  Will forward to [place] to f/u on PA.

## 2016-01-15 NOTE — Telephone Encounter (Signed)
Received a fax from [employer]. Pt's Stiolto has been approved 10/17/2015 - 01/14/2017. Spoke with Theadora Rama at [employer]. She is aware that this has been approved. Nothing further was needed.

## 2016-01-16 ENCOUNTER — Other Ambulatory Visit (INDEPENDENT_AMBULATORY_CARE_PROVIDER_SITE_OTHER): Payer: Medicare Other

## 2016-01-16 DIAGNOSIS — Z5181 Encounter for therapeutic drug level monitoring: Secondary | ICD-10-CM | POA: Diagnosis not present

## 2016-01-16 LAB — BASIC METABOLIC PANEL
BUN: 6 mg/dL (ref 6–23)
CHLORIDE: 103 meq/L (ref 96–112)
CO2: 34 meq/L — AB (ref 19–32)
Calcium: 9.2 mg/dL (ref 8.4–10.5)
Creatinine, Ser: 0.73 mg/dL (ref 0.40–1.20)
GFR: 85.36 mL/min (ref >60.00)
Glucose, Bld: 74 mg/dL (ref 70–99)
Potassium: 4.1 mEq/L (ref 3.5–5.1)
SODIUM: 143 meq/L (ref 135–145)

## 2016-01-17 NOTE — Telephone Encounter (Signed)
Unable to get through to facility. Left message on daughter's voicemail regarding Reclast order being redone and faxed to [facility] with updated lab values. Awaiting appt.

## 2016-01-18 NOTE — Telephone Encounter (Signed)
Attempted to reach [facility]. Phone rang then gave fax tone. Will try later.

## 2016-01-20 ENCOUNTER — Encounter: Payer: Self-pay | Admitting: Family

## 2016-01-21 ENCOUNTER — Telehealth: Payer: Self-pay | Admitting: *Deleted

## 2016-01-21 NOTE — Telephone Encounter (Signed)
Opened in error

## 2016-01-22 ENCOUNTER — Ambulatory Visit (INDEPENDENT_AMBULATORY_CARE_PROVIDER_SITE_OTHER): Payer: Medicare Other | Admitting: Behavioral Health

## 2016-01-22 DIAGNOSIS — E538 Deficiency of other specified B group vitamins: Secondary | ICD-10-CM | POA: Diagnosis not present

## 2016-01-22 MED ORDER — CYANOCOBALAMIN 1000 MCG/ML IJ SOLN
1000.0000 ug | Freq: Once | INTRAMUSCULAR | Status: AC
  Administered 2016-01-22: 1000 ug via INTRAMUSCULAR

## 2016-01-22 NOTE — Telephone Encounter (Signed)
Handicap application and walker Rx faxed to [facility].

## 2016-01-22 NOTE — Addendum Note (Signed)
Addended by: Kelle Darting A on: 01/22/2016 09:37 AM   Modules accepted: Orders

## 2016-01-22 NOTE — Telephone Encounter (Addendum)
Spoke with Abigail Castillo at [employer]. She has not been contacted to schedule Reclast yet. Gave her # for [facility] [phone]) to call and schedule appt. She requests Rx for walker/sitter and applications for handicap placard. Rx printed and forwarded to PCP for signature. Also forwarded Handicap application to PCP.  Received call back from Abigail Castillo stating [facility] did not get my order and to have me call them to schedule appt. Spoke with [facility] and scheduled reclast for 02/04/16 at 1pm. Attempted to notify Abigail Castillo and received busy fax tone. Faxed appt info to [employer].

## 2016-01-22 NOTE — Progress Notes (Signed)
Pre visit review using our clinic review tool, if applicable. No additional management support is needed unless otherwise documented below in the visit note.  Patient in office today for B12 injection. IM given in Left Deltoid. Patient tolerated injection well.  Next appointment scheduled for 02/22/16 at 9:30 AM.

## 2016-01-25 NOTE — Telephone Encounter (Signed)
Received call from pt's daughter stating she spoke with [facility] and they have not received rx for walker w/seat yet. I advised her that Rx was faxed to [facility] because they are the ones that requested the Rx and they did not tell us to fax it to [facility]. Spoke with Maudie Mercury at [facility] and asked her to fax Rx to [facility]. She states pt took the prescription to [facility] last Tuesday while getting some oxygen supplies and was told that Medicare would not pay for it and she would have to pay $100. Pt told them she would just take Rx to [facility] and pay $60 for it there. Spoke with Shelton Silvas at [facility] and was told that we would need to submit Rx and office notes documenting medical necessity for walker with seat. Rx and office note will need to be faxed [place] at [phone].  Pt is due for 3 month follow up on or after 02/02/16.  Notified Kim at [facility] and she is unable to schedule appt at this time and will call us back on Tuesday to schedule appt. Notified pt's daughter.

## 2016-02-04 ENCOUNTER — Ambulatory Visit (HOSPITAL_COMMUNITY)
Admission: RE | Admit: 2016-02-04 | Discharge: 2016-02-04 | Disposition: A | Discharge status: Home / Self Care | Payer: Medicare Other | Source: Ambulatory Visit | Attending: Family | Admitting: Family

## 2016-02-04 ENCOUNTER — Encounter (HOSPITAL_COMMUNITY): Payer: Self-pay

## 2016-02-04 DIAGNOSIS — M810 Age-related osteoporosis without current pathological fracture: Secondary | ICD-10-CM | POA: Insufficient documentation

## 2016-02-04 MED ORDER — ZOLEDRONIC ACID 5 MG/100ML IV SOLN
5.0000 mg | Freq: Once | INTRAVENOUS | Status: AC
  Administered 2016-02-04: 5 mg via INTRAVENOUS
  Filled 2016-02-04: qty 100

## 2016-02-04 MED ORDER — SODIUM CHLORIDE 0.9 % IV SOLN
Freq: Once | INTRAVENOUS | Status: AC
  Administered 2016-02-04: 14:00:00 via INTRAVENOUS

## 2016-02-04 NOTE — Discharge Instructions (Signed)

## 2016-02-04 NOTE — Progress Notes (Signed)
Pt given instructions on reclast.  1st time receiving Reclast, pt tolerated well.  Pt d/c ambulatory with caregiver from [facility].

## 2016-02-14 ENCOUNTER — Telehealth: Payer: Self-pay

## 2016-02-14 NOTE — Telephone Encounter (Signed)
Received Drug Regimen Review Report via fax. Medications reviewed.  Form placed in provider's red folder for review and signature.

## 2016-02-15 ENCOUNTER — Telehealth: Payer: Self-pay | Admitting: Family

## 2016-02-15 MED ORDER — ALPRAZOLAM 0.25 MG PO TABS: 0.2500 mg | ORAL_TABLET | Freq: Every day | ORAL | 0 refills | Status: DC | PRN

## 2016-02-15 NOTE — Telephone Encounter (Signed)
Rx faxed to pharmacy.  Please call pt to scheduled f/u with Melissa soon.  Thanks!

## 2016-02-15 NOTE — Telephone Encounter (Signed)
Last alprazolam RF:  02/26/15, #30 UDS/CSC: none on file Last OV: 11/02/15 Next OV:  Due now / not scheduled.  Rx printed and forwarded to PCP for signature.  Pt needs f/u and can discuss need for UDS / [place] at that visit.

## 2016-02-19 NOTE — Telephone Encounter (Signed)
Called pt. lvm requesting a call back to schedule a F/U appt per PCP.

## 2016-02-22 ENCOUNTER — Ambulatory Visit: Payer: Medicare Other

## 2016-02-22 ENCOUNTER — Telehealth: Payer: Self-pay | Admitting: Family

## 2016-02-22 NOTE — Telephone Encounter (Signed)
It looks like we filled on 9/22 for 30 tabs, so it is too soon to refill.

## 2016-02-22 NOTE — Telephone Encounter (Signed)
Notified Kim at [employer] [facility] and they are needing documentation for pt's file that says pt may take Alprazolam once a day every day. Ok to sign phone note and fax to below #.

## 2016-02-22 NOTE — Telephone Encounter (Signed)
Noted signed and faxed to below #.

## 2016-02-22 NOTE — Telephone Encounter (Signed)
Pt's facilitator Maudie Mercury) says that pt is having to take her Xanax everyday now. She would like to know if PCP could provide her a Rx. She would like to have it faxed to her at [phone] which is also a contact # for her.    Please advise.

## 2016-02-29 ENCOUNTER — Ambulatory Visit (INDEPENDENT_AMBULATORY_CARE_PROVIDER_SITE_OTHER): Payer: Medicare Other | Admitting: Behavioral Health

## 2016-02-29 DIAGNOSIS — E538 Deficiency of other specified B group vitamins: Secondary | ICD-10-CM | POA: Diagnosis not present

## 2016-02-29 MED ORDER — CYANOCOBALAMIN 1000 MCG/ML IJ SOLN
1000.0000 ug | Freq: Once | INTRAMUSCULAR | Status: AC
  Administered 2016-02-29: 1000 ug via INTRAMUSCULAR

## 2016-02-29 NOTE — Progress Notes (Signed)
Pre visit review using our clinic review tool, if applicable. No additional management support is needed unless otherwise documented below in the visit note.  Pt came into office for B12 injection and tolerated it well.

## 2016-03-03 ENCOUNTER — Telehealth: Payer: Self-pay | Admitting: *Deleted

## 2016-03-03 NOTE — Telephone Encounter (Signed)
Pt did not keep follow up for face to face encounter to discuss rolling walker with seat. Spoke with Maudie Mercury at [facility] about r/s appt and she requests Rx for daily use of immodium as pt has diarrhea every morning 1 week. Advised her pt would need office evaluation for that as well and scheduled appt for 03/10/16 at 10:30am to address both.

## 2016-03-05 NOTE — Telephone Encounter (Signed)
Pt has appt 03/10/16 with PCP.

## 2016-03-10 ENCOUNTER — Ambulatory Visit (HOSPITAL_BASED_OUTPATIENT_CLINIC_OR_DEPARTMENT_OTHER)
Admission: RE | Admit: 2016-03-10 | Discharge: 2016-03-10 | Disposition: A | Discharge status: Home / Self Care | Payer: Medicare Other | Source: Ambulatory Visit | Attending: Family | Admitting: Family

## 2016-03-10 ENCOUNTER — Ambulatory Visit (INDEPENDENT_AMBULATORY_CARE_PROVIDER_SITE_OTHER): Payer: Medicare Other | Admitting: Family

## 2016-03-10 ENCOUNTER — Telehealth: Payer: Self-pay | Admitting: Family

## 2016-03-10 ENCOUNTER — Encounter: Payer: Self-pay | Admitting: Family

## 2016-03-10 VITALS — BP 150/91 | HR 93 | Temp 98.0°F | Resp 16 | Ht 62.0 in | Wt 141.8 lb

## 2016-03-10 DIAGNOSIS — I700 Atherosclerosis of aorta: Secondary | ICD-10-CM | POA: Insufficient documentation

## 2016-03-10 DIAGNOSIS — J984 Other disorders of lung: Secondary | ICD-10-CM | POA: Insufficient documentation

## 2016-03-10 DIAGNOSIS — J439 Emphysema, unspecified: Secondary | ICD-10-CM | POA: Diagnosis not present

## 2016-03-10 DIAGNOSIS — R05 Cough: Secondary | ICD-10-CM | POA: Diagnosis not present

## 2016-03-10 DIAGNOSIS — J441 Chronic obstructive pulmonary disease with (acute) exacerbation: Secondary | ICD-10-CM | POA: Diagnosis not present

## 2016-03-10 DIAGNOSIS — Z23 Encounter for immunization: Secondary | ICD-10-CM

## 2016-03-10 DIAGNOSIS — R062 Wheezing: Secondary | ICD-10-CM

## 2016-03-10 MED ORDER — DEXLANSOPRAZOLE 60 MG PO CPDR: 60.0000 mg | DELAYED_RELEASE_CAPSULE | Freq: Every day | ORAL | 5 refills | Status: DC

## 2016-03-10 MED ORDER — PREDNISONE 10 MG PO TABS: ORAL_TABLET | ORAL | 0 refills | Status: DC

## 2016-03-10 MED ORDER — DOXYCYCLINE HYCLATE 100 MG PO TABS: 100.0000 mg | ORAL_TABLET | Freq: Two times a day (BID) | ORAL | 0 refills | Status: DC

## 2016-03-10 NOTE — Telephone Encounter (Signed)
Melissa-- doesn't look like we have prescribed this for pt before. Is this something you will refill or do we send to GI?

## 2016-03-10 NOTE — Progress Notes (Signed)
Subjective:    Patient ID: Abigail Castillo, female    DOB: November 22, 1950, 65 y.o.   MRN: [id number]  HPI  Abigail Castillo is a 65 yr old female who presents today with chief complaint of diarrhea. Symptoms have been present x 1 week.  She reports associated nausea and vomiting.   She last ate on Saturday. She is tolerating fluids.  Had normal BM today. Reports some associated cough, chest congestion, nasal congestion. Reports she woke up during the night sweating but that has resolved. Overall symptoms are resolved.  She denies current nausea.  Had 3 loose stools yesterday.    She is also requesting a rolling walker with a seat.     Review of Systems See HPI  Past Medical History:  Diagnosis Date  . Anxiety   . Blood transfusion   . Blood transfusion without reported diagnosis   . Chronic mental illness   . COPD (chronic obstructive pulmonary disease) ([place])   . DEPRESSION 05/31/2009  . Emphysema of lung ([place])   . Esophageal stricture 03/14/2015  . History of home oxygen therapy   . History of pneumonia   . Osteoporosis   . Shortness of breath dyspnea   . Tobacco abuse    ongoing  . Vitamin B 12 deficiency   . Vitamin D deficiency      Social History   Social History  . Marital status: Divorced    Spouse name: N/A  . Number of children: 3  . Years of education: N/A   Occupational History  . disabled Unemployed   Social History Main Topics  . Smoking status: Former Smoker    Types: Cigarettes    Quit date: 08/12/2011  . Smokeless tobacco: Never Used  . Alcohol use No  . Drug use: No  . Sexual activity: Not on file   Other Topics Concern  . Not on file   Social History Narrative  . No narrative on file    Past Surgical History:  Procedure Laterality Date  . ABDOMINAL HYSTERECTOMY    . BREAST LUMPECTOMY  1972   left breast  . BREAST LUMPECTOMY WITH RADIOACTIVE SEED LOCALIZATION Left 11/22/2015   Procedure: LEFT BREAST LUMPECTOMY WITH RADIOACTIVE SEED LOCALIZATION;   Surgeon: Erroll Luna, MD;  Location: [facility];  Service: General;  Laterality: Left;  . BREAST SURGERY  ?2015   lumpectomy, left  . CERVICAL CONE BIOPSY    . CHOLECYSTECTOMY    . COLONOSCOPY    . ESOPHAGOGASTRODUODENOSCOPY (EGD) WITH ESOPHAGEAL DILATION    . UPPER GASTROINTESTINAL ENDOSCOPY      Family History  Problem Relation Age of Onset  . Coronary artery disease Mother   . Diabetes Mother   . Hypertension Mother   . Coronary artery disease Father   . Diabetes Father     Grandfather  . Hypertension Father   . Stroke Father   . Other Father     colostomy bag  . Breast cancer Sister   . Cancer Other     niece--breast  . Diabetes Sister   . Hypertension Sister   . Mental illness Sister     x 4  . Esophageal cancer Neg Hx   . Stomach cancer Neg Hx   . Rectal cancer Neg Hx   . Colon cancer Neg Hx   . Colon polyps Neg Hx     Allergies  Allergen Reactions  . Sulfonamide Derivatives Hives    Current Outpatient Prescriptions on File Prior to Visit  Medication Sig Dispense Refill  . ALPRAZolam (XANAX) 0.25 MG tablet Take 1 tablet (0.25 mg total) by mouth daily as needed. 30 tablet 0  . Calcium Carbonate-Vitamin D (CALTRATE 600+D) 600-400 MG-UNIT per tablet Take 1 tablet by mouth 2 (two) times daily.    . cholecalciferol (VITAMIN D) 1000 UNITS tablet Take 3,000 Units by mouth daily.     . citalopram (CELEXA) 20 MG tablet TAKE 1 TABLET BY MOUTH ONCE DAILY 30 tablet 5  . Cyanocobalamin (VITAMIN B-12 IJ) Inject 1 Dose as directed every 30 (thirty) days.    Marland Kitchen dexlansoprazole (DEXILANT) 60 MG capsule Take 60 mg by mouth daily.    Marland Kitchen guaiFENesin (MUCINEX) 600 MG 12 hr tablet Take 1,200 mg by mouth 2 (two) times daily as needed for congestion. Reported on 09/11/2015    . HYDROcodone-acetaminophen (NORCO) 5-325 MG tablet Take 1-2 tablets by mouth every 6 (six) hours as needed for moderate pain. 30 tablet 0  . ipratropium-albuterol (DUONEB) 0.5-2.5 (3) MG/3ML SOLN Take 3 mLs by  nebulization 4 (four) times daily as needed. 360 mL 3  . loperamide (IMODIUM) 2 MG capsule TAKE 1 CAPSULE   (2 MG TOTAL) BY MOUTH AS NEEDED FOR DIARRHEA OR LOOSE STOOLS. 30 capsule 5  . OLANZapine (ZYPREXA) 5 MG tablet TAKE 1 TABLET BY MOUTH DAILY AT BEDTIME 30 tablet 5  . OXYGEN Inhale 3 L into the lungs.    . Tiotropium Bromide-Olodaterol (STIOLTO RESPIMAT) 2.5-2.5 MCG/ACT AERS Inhale 2 puffs into the lungs daily. 1 Inhaler 5  . VENTOLIN HFA 108 (90 Base) MCG/ACT inhaler INHALE TWO   PUFFS INTO THE LUNGS EVERY 6 (SIX) HOURS AS NEEDED FOR WHEEZING OR SHORTNESS OF BREATH. 18 g 5   Current Facility-Administered Medications on File Prior to Visit  Medication Dose Route Frequency Provider Last Rate Last Dose  . cyanocobalamin ((VITAMIN B-12)) injection 1,000 mcg  1,000 mcg Intramuscular Q30 days [name], NP   1,000 mcg at 10/15/15 1150    BP (!) 150/91 (BP Location: Right Arm, Cuff Size: Normal)   Pulse 93   Temp 98 F (36.7 C) (Oral)   Resp 16   Ht 5\' 2"  (1.575 m)   Wt 141 lb 12.8 oz (64.3 kg)   SpO2 97% Comment: room air  BMI 25.94 kg/m       Objective:   Physical Exam  Constitutional: She is oriented to person, place, and time.  HENT:  Head: Normocephalic and atraumatic.  Right Ear: Tympanic membrane and ear canal normal.  Left Ear: Tympanic membrane and ear canal normal.  Mouth/Throat: No oropharyngeal exudate, posterior oropharyngeal edema or posterior oropharyngeal erythema.  Pulmonary/Chest: Effort normal. No respiratory distress. She has wheezes.  Neurological: She is alert and oriented to person, place, and time.  Skin: Skin is warm and dry.  Psychiatric: She has a normal mood and affect. Her behavior is normal. Judgment and thought content normal.          Assessment & Plan:  COPD exacerbation- rx with doxycycline, pred taper, albuterol Q6 hours. Obtain CXR.  Suspect symptoms began as an acute viral illness.  Diarrhea/nausea seem to be resolving. Advised  pt to advance diet slowly as tolerated and let us know if recurrent vomiting/diarrhea occurs. Pt verbalizes understanding.   Flu shot today.

## 2016-03-10 NOTE — Patient Instructions (Addendum)
Please complete chest x ray on the first floor. Begin prednisone for wheezing and use albuterol every 6 hours for next few days then as needed. Begin Doxycycline (for bronchitis).  Call if new/worsening symptoms or if symptoms are not improved in 2-3 days.

## 2016-03-10 NOTE — Progress Notes (Signed)
Pre visit review using our clinic review tool, if applicable. No additional management support is needed unless otherwise documented below in the visit note. 

## 2016-03-10 NOTE — Telephone Encounter (Signed)
Caller name: Maudie Mercury  Relation to pt: [employer] back number: [phone]  Pharmacy: [facility], [address] [phone] (Phone) [phone] (Fax)     Reason for call:  Requesting a refill dexlansoprazole (DEXILANT) 60 MG capsule

## 2016-03-13 ENCOUNTER — Other Ambulatory Visit: Payer: Self-pay | Admitting: Family

## 2016-03-13 NOTE — Telephone Encounter (Signed)
Last OV: 03/10/16 Last filled: 02/15/16, #30, 0 RF Sig: Take 1 tablet (0.25 mg total) by mouth daily as needed.  UDS: Not on file.

## 2016-03-14 NOTE — Telephone Encounter (Signed)
See rx. 

## 2016-03-20 NOTE — Telephone Encounter (Signed)
Done

## 2016-03-21 ENCOUNTER — Encounter: Payer: Self-pay | Admitting: Family

## 2016-03-21 ENCOUNTER — Ambulatory Visit (INDEPENDENT_AMBULATORY_CARE_PROVIDER_SITE_OTHER): Payer: Medicare Other | Admitting: Family

## 2016-03-21 DIAGNOSIS — J449 Chronic obstructive pulmonary disease, unspecified: Secondary | ICD-10-CM | POA: Diagnosis not present

## 2016-03-21 NOTE — Progress Notes (Signed)
Subjective:    Patient ID: Abigail Castillo, female    DOB: 1950-08-02, 65 y.o.   MRN: [id number]  HPI  Abigail Castillo is a 65 yr old female who presents today for follow up of her COPD exacerbation. She was seen on 03/10/16 and was treated with doxycycline and prednisone taper.  She reports that her breathing is much improved. Denies SOB or wheezing currently.  Review of Systems    see HPI  Past Medical History:  Diagnosis Date  . Anxiety   . Blood transfusion   . Blood transfusion without reported diagnosis   . Chronic mental illness   . COPD (chronic obstructive pulmonary disease) ([place])   . DEPRESSION 05/31/2009  . Emphysema of lung ([place])   . Esophageal stricture 03/14/2015  . History of home oxygen therapy   . History of pneumonia   . Osteoporosis   . Shortness of breath dyspnea   . Tobacco abuse    ongoing  . Vitamin B 12 deficiency   . Vitamin D deficiency      Social History   Social History  . Marital status: Divorced    Spouse name: N/A  . Number of children: 3  . Years of education: N/A   Occupational History  . disabled Unemployed   Social History Main Topics  . Smoking status: Former Smoker    Types: Cigarettes    Quit date: 08/12/2011  . Smokeless tobacco: Never Used  . Alcohol use No  . Drug use: No  . Sexual activity: Not on file   Other Topics Concern  . Not on file   Social History Narrative  . No narrative on file    Past Surgical History:  Procedure Laterality Date  . ABDOMINAL HYSTERECTOMY    . BREAST LUMPECTOMY  1972   left breast  . BREAST LUMPECTOMY WITH RADIOACTIVE SEED LOCALIZATION Left 11/22/2015   Procedure: LEFT BREAST LUMPECTOMY WITH RADIOACTIVE SEED LOCALIZATION;  Surgeon: Erroll Luna, MD;  Location: [facility];  Service: General;  Laterality: Left;  . BREAST SURGERY  ?2015   lumpectomy, left  . CERVICAL CONE BIOPSY    . CHOLECYSTECTOMY    . COLONOSCOPY    . ESOPHAGOGASTRODUODENOSCOPY (EGD) WITH ESOPHAGEAL DILATION    .  UPPER GASTROINTESTINAL ENDOSCOPY      Family History  Problem Relation Age of Onset  . Coronary artery disease Mother   . Diabetes Mother   . Hypertension Mother   . Coronary artery disease Father   . Diabetes Father     Grandfather  . Hypertension Father   . Stroke Father   . Other Father     colostomy bag  . Breast cancer Sister   . Cancer Other     niece--breast  . Diabetes Sister   . Hypertension Sister   . Mental illness Sister     x 4  . Esophageal cancer Neg Hx   . Stomach cancer Neg Hx   . Rectal cancer Neg Hx   . Colon cancer Neg Hx   . Colon polyps Neg Hx     Allergies  Allergen Reactions  . Sulfonamide Derivatives Hives    Current Outpatient Prescriptions on File Prior to Visit  Medication Sig Dispense Refill  . ALPRAZolam (XANAX) 0.25 MG tablet TAKE ONE TABLET BY MOUTH DAILY AS NEEDED 30 tablet 0  . Calcium Carbonate-Vitamin D (CALTRATE 600+D) 600-400 MG-UNIT per tablet Take 1 tablet by mouth 2 (two) times daily.    . cholecalciferol (VITAMIN  D) 1000 UNITS tablet Take 3,000 Units by mouth daily.     . citalopram (CELEXA) 20 MG tablet TAKE 1 TABLET BY MOUTH ONCE DAILY 30 tablet 5  . Cyanocobalamin (VITAMIN B-12 IJ) Inject 1 Dose as directed every 30 (thirty) days.    Marland Kitchen dexlansoprazole (DEXILANT) 60 MG capsule Take 1 capsule (60 mg total) by mouth daily. 30 capsule 5  . guaiFENesin (MUCINEX) 600 MG 12 hr tablet Take 1,200 mg by mouth 2 (two) times daily as needed for congestion. Reported on 09/11/2015    . HYDROcodone-acetaminophen (NORCO) 5-325 MG tablet Take 1-2 tablets by mouth every 6 (six) hours as needed for moderate pain. 30 tablet 0  . ipratropium-albuterol (DUONEB) 0.5-2.5 (3) MG/3ML SOLN Take 3 mLs by nebulization 4 (four) times daily as needed. 360 mL 3  . loperamide (IMODIUM) 2 MG capsule TAKE 1 CAPSULE   (2 MG TOTAL) BY MOUTH AS NEEDED FOR DIARRHEA OR LOOSE STOOLS. 30 capsule 5  . OLANZapine (ZYPREXA) 5 MG tablet TAKE 1 TABLET BY MOUTH DAILY AT  BEDTIME 30 tablet 5  . OXYGEN Inhale 3 L into the lungs.    . Tiotropium Bromide-Olodaterol (STIOLTO RESPIMAT) 2.5-2.5 MCG/ACT AERS Inhale 2 puffs into the lungs daily. 1 Inhaler 5  . VENTOLIN HFA 108 (90 Base) MCG/ACT inhaler INHALE TWO   PUFFS INTO THE LUNGS EVERY 6 (SIX) HOURS AS NEEDED FOR WHEEZING OR SHORTNESS OF BREATH. 18 g 5   Current Facility-Administered Medications on File Prior to Visit  Medication Dose Route Frequency Provider Last Rate Last Dose  . cyanocobalamin ((VITAMIN B-12)) injection 1,000 mcg  1,000 mcg Intramuscular Q30 days [name], NP   1,000 mcg at 10/15/15 1150    BP 120/84 (BP Location: Right Arm, Cuff Size: Normal)   Pulse 85   Temp 97.9 F (36.6 C) (Oral)   Resp 16   Ht 5\' 2"  (1.575 m)   Wt 143 lb 12.8 oz (65.2 kg)   SpO2 96% Comment: room air  BMI 26.30 kg/m    Objective:   Physical Exam  Constitutional: She is oriented to person, place, and time. She appears well-developed and well-nourished.  Cardiovascular: Normal rate, regular rhythm and normal heart sounds.   No murmur heard. Pulmonary/Chest: Effort normal and breath sounds normal. No respiratory distress. She has no wheezes.  Neurological: She is alert and oriented to person, place, and time.  Psychiatric: She has a normal mood and affect. Her behavior is normal. Judgment and thought content normal.          Assessment & Plan:

## 2016-03-21 NOTE — Progress Notes (Signed)
Pre visit review using our clinic review tool, if applicable. No additional management support is needed unless otherwise documented below in the visit note. 

## 2016-03-22 NOTE — Assessment & Plan Note (Signed)
Exacerbation is resolved.  Continue prn albuterol.

## 2016-03-24 ENCOUNTER — Ambulatory Visit (INDEPENDENT_AMBULATORY_CARE_PROVIDER_SITE_OTHER): Payer: Medicare Other | Admitting: Emergency Medicine

## 2016-03-24 ENCOUNTER — Encounter: Payer: Self-pay | Admitting: Emergency Medicine

## 2016-03-24 DIAGNOSIS — J449 Chronic obstructive pulmonary disease, unspecified: Secondary | ICD-10-CM

## 2016-03-24 MED ORDER — TIOTROPIUM BROMIDE-OLODATEROL 2.5-2.5 MCG/ACT IN AERS: 2.0000 | INHALATION_SPRAY | Freq: Every day | RESPIRATORY_TRACT | 5 refills | Status: DC

## 2016-03-24 NOTE — Patient Instructions (Addendum)
We will try again to start Stiolto 2 puffs every day.  Take albuterol either by nebulizer or inhale 2 puffs up to every 4 hours if needed for shortness of breath.  Flu shot up to date Follow with Dr Lamonte Sakai in 2 months or sooner if you have any problems.

## 2016-03-24 NOTE — Progress Notes (Signed)
Subjective:    Patient ID: Abigail Castillo, female    DOB: July 29, 1950, 65 y.o.   MRN: [id number]  HPI 65 year old former smoker with a documented history of COPD made years ago, believes that she had spirometry a long time ago, cant recall where. Also with a history of osteoporosis and depression, B12 deficiency. She is followed by [facility] primary care. She was seen in ED 3/17 for an AE-COPD, was treated with pred. She states that she exacerbates frequently. She is on Symbicort, has used it for years. Uses SABA at least once a day. She typically has daily cough, has been better lately. Has exertional SOB, is limited by her breathing.   [name] 01/14/16 -- Follow-up visit for former tobacco use and COPD. I saw her in April 2017 and she was on Symbicort. We have performed pulmonary function testing today and I have reviewed the results. She has very severe obstruction without a bronchodilator response. She was unable to do the diffusion capacity or the lung volumes.  She is only using Symbicort prn, albuterol prn. She has minimal cough, prod of phlegm. Occasional wheeze. No flares since last time.   [name] 03/24/16 -- This is a follow-up visit for COPD with very severe obstruction. At her last visit we tried to start Stiolto to see if she would benefit. Unfortunately she was not able to obtain this due to poor insurance coverage. She was treated for an acute flare by PCP 2 weeks ago. Her only BD right now is Symbicort prn. She has albuterol nebs available but does not use / need right now. Her O2 is at 2L/min at all times.    Review of Systems  Constitutional: Negative for fever and unexpected weight change.  HENT: Negative for congestion, dental problem, ear pain, nosebleeds, postnasal drip, rhinorrhea, sinus pressure, sneezing, sore throat and trouble swallowing.   Eyes: Negative for redness and itching.  Respiratory: Positive for shortness of breath. Negative for cough, chest tightness and wheezing.     Cardiovascular: Negative for palpitations and leg swelling.  Gastrointestinal: Negative for nausea and vomiting.  Genitourinary: Negative for dysuria.  Musculoskeletal: Negative for joint swelling.  Skin: Negative for rash.  Neurological: Negative for headaches.  Hematological: Does not bruise/bleed easily.  Psychiatric/Behavioral: Negative for dysphoric mood. The patient is not nervous/anxious.     Past Medical History:  Diagnosis Date  . Anxiety   . Blood transfusion   . Blood transfusion without reported diagnosis   . Chronic mental illness   . COPD (chronic obstructive pulmonary disease) ([place])   . DEPRESSION 05/31/2009  . Emphysema of lung ([place])   . Esophageal stricture 03/14/2015  . History of home oxygen therapy   . History of pneumonia   . Osteoporosis   . Shortness of breath dyspnea   . Tobacco abuse    ongoing  . Vitamin B 12 deficiency   . Vitamin D deficiency      Family History  Problem Relation Age of Onset  . Coronary artery disease Mother   . Diabetes Mother   . Hypertension Mother   . Coronary artery disease Father   . Diabetes Father     Grandfather  . Hypertension Father   . Stroke Father   . Other Father     colostomy bag  . Breast cancer Sister   . Cancer Other     niece--breast  . Diabetes Sister   . Hypertension Sister   . Mental illness Sister  x 4  . Esophageal cancer Neg Hx   . Stomach cancer Neg Hx   . Rectal cancer Neg Hx   . Colon cancer Neg Hx   . Colon polyps Neg Hx      Social History   Social History  . Marital status: Divorced    Spouse name: N/A  . Number of children: 3  . Years of education: N/A   Occupational History  . disabled Unemployed   Social History Main Topics  . Smoking status: Former Smoker    Types: Cigarettes    Quit date: 08/12/2011  . Smokeless tobacco: Never Used  . Alcohol use No  . Drug use: No  . Sexual activity: Not on file   Other Topics Concern  . Not on file   Social History  Narrative  . No narrative on file     Allergies  Allergen Reactions  . Sulfonamide Derivatives Hives     Outpatient Medications Prior to Visit  Medication Sig Dispense Refill  . ALPRAZolam (XANAX) 0.25 MG tablet TAKE ONE TABLET BY MOUTH DAILY AS NEEDED 30 tablet 0  . Calcium Carbonate-Vitamin D (CALTRATE 600+D) 600-400 MG-UNIT per tablet Take 1 tablet by mouth 2 (two) times daily.    . cholecalciferol (VITAMIN D) 1000 UNITS tablet Take 3,000 Units by mouth daily.     . citalopram (CELEXA) 20 MG tablet TAKE 1 TABLET BY MOUTH ONCE DAILY 30 tablet 5  . Cyanocobalamin (VITAMIN B-12 IJ) Inject 1 Dose as directed every 30 (thirty) days.    Marland Kitchen dexlansoprazole (DEXILANT) 60 MG capsule Take 1 capsule (60 mg total) by mouth daily. 30 capsule 5  . guaiFENesin (MUCINEX) 600 MG 12 hr tablet Take 1,200 mg by mouth 2 (two) times daily as needed for congestion. Reported on 09/11/2015    . HYDROcodone-acetaminophen (NORCO) 5-325 MG tablet Take 1-2 tablets by mouth every 6 (six) hours as needed for moderate pain. 30 tablet 0  . ipratropium-albuterol (DUONEB) 0.5-2.5 (3) MG/3ML SOLN Take 3 mLs by nebulization 4 (four) times daily as needed. 360 mL 3  . loperamide (IMODIUM) 2 MG capsule TAKE 1 CAPSULE   (2 MG TOTAL) BY MOUTH AS NEEDED FOR DIARRHEA OR LOOSE STOOLS. 30 capsule 5  . OLANZapine (ZYPREXA) 5 MG tablet TAKE 1 TABLET BY MOUTH DAILY AT BEDTIME 30 tablet 5  . OXYGEN Inhale 3 L into the lungs.    . Tiotropium Bromide-Olodaterol (STIOLTO RESPIMAT) 2.5-2.5 MCG/ACT AERS Inhale 2 puffs into the lungs daily. 1 Inhaler 5  . VENTOLIN HFA 108 (90 Base) MCG/ACT inhaler INHALE TWO   PUFFS INTO THE LUNGS EVERY 6 (SIX) HOURS AS NEEDED FOR WHEEZING OR SHORTNESS OF BREATH. 18 g 5   Facility-Administered Medications Prior to Visit  Medication Dose Route Frequency Provider Last Rate Last Dose  . cyanocobalamin ((VITAMIN B-12)) injection 1,000 mcg  1,000 mcg Intramuscular Q30 days [name], NP   1,000 mcg at  10/15/15 1150         Objective:   Physical Exam  Vitals:   03/24/16 1010  BP: 114/78  Pulse: (!) 120  SpO2: 93%  Weight: 142 lb (64.4 kg)   Gen: Pleasant, well-nourished, in no distress  ENT: No lesions,  Edentulous, mouth clear,  oropharynx clear, no postnasal drip  Neck: No JVD, no TMG, no carotid bruits  Lungs: No use of accessory muscles,  clear without rales or rhonchi  Cardiovascular: RRR, heart sounds normal, no murmur or gallops, no peripheral edema  Musculoskeletal: No  deformities, no cyanosis or clubbing  Neuro: alert, non focal, oriented but fairly simple responses, poor recall of her med and history  Skin: Warm, no lesions or rashes       Assessment & Plan:  COPD (chronic obstructive pulmonary disease) ([place]) She was not able to start / did not start the Stiolto. It has been approved by insurance so we will try again to start it.  Continue albuterol prn  Flu shot up to date.   Baltazar Apo, MD, PhD 03/24/2016, 10:28 AM [facility] [phone] or if no answer [phone]

## 2016-03-24 NOTE — Assessment & Plan Note (Signed)
She was not able to start / did not start the Crisp. It has been approved by insurance so we will try again to start it.  Continue albuterol prn  Flu shot up to date.

## 2016-03-24 NOTE — Addendum Note (Signed)
Addended by: Desmond Dike C on: 03/24/2016 10:31 AM   Modules accepted: Orders

## 2016-03-28 ENCOUNTER — Ambulatory Visit: Payer: Medicare Other

## 2016-04-01 ENCOUNTER — Ambulatory Visit: Payer: Medicare Other

## 2016-04-08 ENCOUNTER — Ambulatory Visit (INDEPENDENT_AMBULATORY_CARE_PROVIDER_SITE_OTHER): Payer: Medicare Other

## 2016-04-08 DIAGNOSIS — E538 Deficiency of other specified B group vitamins: Secondary | ICD-10-CM

## 2016-04-08 MED ORDER — CYANOCOBALAMIN 1000 MCG/ML IJ SOLN
1000.0000 ug | Freq: Once | INTRAMUSCULAR | Status: AC
  Administered 2016-04-08: 1000 ug via INTRAMUSCULAR

## 2016-04-08 NOTE — Progress Notes (Signed)
Pre visit review using our clinic tool,if applicable. No additional management support is needed unless otherwise documented below in the visit note.   Patient in for B12 injection per order from [facility].  Given IM Left Deltoid. Patient tolerated well. Return appointment given for 1 month.

## 2016-05-05 ENCOUNTER — Other Ambulatory Visit: Payer: Self-pay | Admitting: Family

## 2016-05-05 NOTE — Telephone Encounter (Signed)
Rx faxed to pharmacy  

## 2016-05-05 NOTE — Telephone Encounter (Signed)
Signed.

## 2016-05-05 NOTE — Telephone Encounter (Signed)
Next OV 06/23/16 UDS: no previous UDS or CSC on file Last OV: 03/14/16, #30.  Rx printed and forwarded to PCP for signature.

## 2016-05-08 ENCOUNTER — Ambulatory Visit (INDEPENDENT_AMBULATORY_CARE_PROVIDER_SITE_OTHER): Payer: Medicare Other

## 2016-05-08 ENCOUNTER — Other Ambulatory Visit: Payer: Self-pay | Admitting: Family

## 2016-05-08 DIAGNOSIS — E538 Deficiency of other specified B group vitamins: Secondary | ICD-10-CM

## 2016-05-08 MED ORDER — CYANOCOBALAMIN 1000 MCG/ML IJ SOLN
1000.0000 ug | Freq: Once | INTRAMUSCULAR | Status: AC
  Administered 2016-05-08: 1000 ug via INTRAMUSCULAR

## 2016-05-08 NOTE — Progress Notes (Signed)
Pre visit review using our clinic tool,if applicable. No additional management support is needed unless otherwise documented below in the visit note.   Patient in for B12 injection per order from M. Inda Castle NP. Given IM Right deltoid. Patient tolerated well. Return appointment given for 4 weeks.

## 2016-05-29 ENCOUNTER — Ambulatory Visit: Payer: Medicare Other | Admitting: Emergency Medicine

## 2016-06-10 ENCOUNTER — Ambulatory Visit: Payer: Medicare Other

## 2016-06-18 DIAGNOSIS — F259 Schizoaffective disorder, unspecified: Secondary | ICD-10-CM | POA: Diagnosis not present

## 2016-06-20 ENCOUNTER — Encounter: Payer: Self-pay | Admitting: Emergency Medicine

## 2016-06-20 ENCOUNTER — Ambulatory Visit (INDEPENDENT_AMBULATORY_CARE_PROVIDER_SITE_OTHER): Payer: Medicare Other | Admitting: Emergency Medicine

## 2016-06-20 DIAGNOSIS — J449 Chronic obstructive pulmonary disease, unspecified: Secondary | ICD-10-CM

## 2016-06-20 MED ORDER — ALBUTEROL SULFATE HFA 108 (90 BASE) MCG/ACT IN AERS: INHALATION_SPRAY | RESPIRATORY_TRACT | 5 refills | Status: DC

## 2016-06-20 NOTE — Addendum Note (Signed)
Addended by: Desmond Dike C on: 06/20/2016 10:12 AM   Modules accepted: Orders

## 2016-06-20 NOTE — Patient Instructions (Signed)
Walking oximetry today to requalify for exertional oxygen Work hard on using your oxygen more reliably when you are up exerting yourself.  Continue Stiolto once a day We will refill your albuterol to use 2 puffs as needed for shortness of breath. Use this with a spacer - we will show you how.  Flu shot up to date Follow with Dr Lamonte Sakai in 4 months or sooner if you have any problems.

## 2016-06-20 NOTE — Assessment & Plan Note (Signed)
Walking oximetry today to requalify for exertional oxygen Work hard on using your oxygen more reliably when you are up exerting yourself.  Continue Stiolto once a day We will refill your albuterol to use 2 puffs as needed for shortness of breath. Use this with a spacer - we will show you how.  Flu shot up to date Follow with Dr Lamonte Sakai in 4 months or sooner if you have any problems.

## 2016-06-20 NOTE — Progress Notes (Signed)
Subjective:    Patient ID: Abigail Castillo, female    DOB: 1951/05/21, 66 y.o.   MRN: [id number]  HPI 66 year old former smoker with a documented history of COPD made years ago, believes that she had spirometry a long time ago, cant recall where. Also with a history of osteoporosis and depression, B12 deficiency. She is followed by [facility] primary care. She was seen in ED 3/17 for an AE-COPD, was treated with pred. She states that she exacerbates frequently. She is on Symbicort, has used it for years. Uses SABA at least once a day. She typically has daily cough, has been better lately. Has exertional SOB, is limited by her breathing.   [name] 65/21/17 -- Follow-up visit for former tobacco use and COPD. I saw her in April 2017 and she was on Symbicort. We have performed pulmonary function testing today and I have reviewed the results. She has very severe obstruction without a bronchodilator response. She was unable to do the diffusion capacity or the lung volumes.  She is only using Symbicort prn, albuterol prn. She has minimal cough, prod of phlegm. Occasional wheeze. No flares since last time.   [name] 03/24/16 -- This is a follow-up visit for COPD with very severe obstruction. At her last visit we tried to start Stiolto to see if she would benefit. Unfortunately she was not able to obtain this due to poor insurance coverage. She was treated for an acute flare by PCP 2 weeks ago. Her only BD right now is Symbicort prn. She has albuterol nebs available but does not use / need right now. Her O2 is at 2L/min at all times.   [name] 06/20/16 -- Patient has a history of severe obstructive lung disease, COPD. She was able to get Stiolto, has been using it reliable. Having less exertional dyspnea. Still does still have occasional wheeze, cough. She doesn't have an albuterol right now.    Review of Systems  Constitutional: Negative for fever and unexpected weight change.  HENT: Negative for congestion, dental  problem, ear pain, nosebleeds, postnasal drip, rhinorrhea, sinus pressure, sneezing, sore throat and trouble swallowing.   Eyes: Negative for redness and itching.  Respiratory: Positive for shortness of breath. Negative for cough, chest tightness and wheezing.   Cardiovascular: Negative for palpitations and leg swelling.  Gastrointestinal: Negative for nausea and vomiting.  Genitourinary: Negative for dysuria.  Musculoskeletal: Negative for joint swelling.  Skin: Negative for rash.  Neurological: Negative for headaches.  Hematological: Does not bruise/bleed easily.  Psychiatric/Behavioral: Negative for dysphoric mood. The patient is not nervous/anxious.     Past Medical History:  Diagnosis Date  . Anxiety   . Blood transfusion   . Blood transfusion without reported diagnosis   . Chronic mental illness   . COPD (chronic obstructive pulmonary disease) ([place])   . DEPRESSION 05/31/2009  . Emphysema of lung ([place])   . Esophageal stricture 03/14/2015  . History of home oxygen therapy   . History of pneumonia   . Osteoporosis   . Shortness of breath dyspnea   . Tobacco abuse    ongoing  . Vitamin B 12 deficiency   . Vitamin D deficiency      Family History  Problem Relation Age of Onset  . Coronary artery disease Mother   . Diabetes Mother   . Hypertension Mother   . Coronary artery disease Father   . Diabetes Father     Grandfather  . Hypertension Father   . Stroke Father   .  Other Father     colostomy bag  . Breast cancer Sister   . Cancer Other     niece--breast  . Diabetes Sister   . Hypertension Sister   . Mental illness Sister     x 4  . Esophageal cancer Neg Hx   . Stomach cancer Neg Hx   . Rectal cancer Neg Hx   . Colon cancer Neg Hx   . Colon polyps Neg Hx      Social History   Social History  . Marital status: Divorced    Spouse name: N/A  . Number of children: 3  . Years of education: N/A   Occupational History  . disabled Unemployed   Social  History Main Topics  . Smoking status: Former Smoker    Types: Cigarettes    Quit date: 08/12/2011  . Smokeless tobacco: Never Used  . Alcohol use No  . Drug use: No  . Sexual activity: Not on file   Other Topics Concern  . Not on file   Social History Narrative  . No narrative on file     Allergies  Allergen Reactions  . Sulfonamide Derivatives Hives     Outpatient Medications Prior to Visit  Medication Sig Dispense Refill  . ALPRAZolam (XANAX) 0.25 MG tablet TAKE ONE TABLET BY MOUTH DAILY AS NEEDED 30 tablet 0  . Calcium Carbonate-Vitamin D (CALTRATE 600+D) 600-400 MG-UNIT per tablet Take 1 tablet by mouth 2 (two) times daily.    . cholecalciferol (VITAMIN D) 1000 UNITS tablet Take 3,000 Units by mouth daily.     . citalopram (CELEXA) 20 MG tablet TAKE 1 TABLET BY MOUTH ONCE DAILY 30 tablet 5  . Cyanocobalamin (VITAMIN B-12 IJ) Inject 1 Dose as directed every 30 (thirty) days.    Marland Kitchen dexlansoprazole (DEXILANT) 60 MG capsule Take 1 capsule (60 mg total) by mouth daily. 30 capsule 5  . guaiFENesin (MUCINEX) 600 MG 12 hr tablet Take 1,200 mg by mouth 2 (two) times daily as needed for congestion. Reported on 09/11/2015    . loperamide (IMODIUM) 2 MG capsule TAKE 1 CAPSULE   (2 MG TOTAL) BY MOUTH AS NEEDED FOR DIARRHEA OR LOOSE STOOLS. 30 capsule 1  . OLANZapine (ZYPREXA) 5 MG tablet TAKE 1 TABLET BY MOUTH DAILY AT BEDTIME 30 tablet 5  . OXYGEN Inhale 3 L into the lungs.    . Tiotropium Bromide-Olodaterol (STIOLTO RESPIMAT) 2.5-2.5 MCG/ACT AERS Inhale 2 puffs into the lungs daily. 1 Inhaler 5  . VENTOLIN HFA 108 (90 Base) MCG/ACT inhaler INHALE TWO   PUFFS INTO THE LUNGS EVERY 6 (SIX) HOURS AS NEEDED FOR WHEEZING OR SHORTNESS OF BREATH. 18 g 5  . HYDROcodone-acetaminophen (NORCO) 5-325 MG tablet Take 1-2 tablets by mouth every 6 (six) hours as needed for moderate pain. (Patient not taking: Reported on 06/20/2016) 30 tablet 0  . ipratropium-albuterol (DUONEB) 0.5-2.5 (3) MG/3ML SOLN Take 3  mLs by nebulization 4 (four) times daily as needed. (Patient not taking: Reported on 06/20/2016) 360 mL 3   Facility-Administered Medications Prior to Visit  Medication Dose Route Frequency Provider Last Rate Last Dose  . cyanocobalamin ((VITAMIN B-12)) injection 1,000 mcg  1,000 mcg Intramuscular Q30 days [name], NP   1,000 mcg at 10/15/15 1150         Objective:   Physical Exam  Vitals:   06/20/16 0940  BP: 114/80  Pulse: (!) 107  SpO2: 95%  Weight: 143 lb 9.6 oz (65.1 kg)  Height: 5\' 2"  (  1.575 m)   Gen: Pleasant, well-nourished, in no distress  ENT: No lesions,  Edentulous, mouth clear,  oropharynx clear, no postnasal drip  Neck: No JVD, no TMG, no carotid bruits  Lungs: No use of accessory muscles,  clear without rales or rhonchi  Cardiovascular: RRR, heart sounds normal, no murmur or gallops, no peripheral edema  Musculoskeletal: No deformities, no cyanosis or clubbing  Neuro: alert, non focal, oriented   Skin: Warm, no lesions or rashes       Assessment & Plan:  COPD (chronic obstructive pulmonary disease) ([facility]) Walking oximetry today to requalify for exertional oxygen Work hard on using your oxygen more reliably when you are up exerting yourself.  Continue Stiolto once a day We will refill your albuterol to use 2 puffs as needed for shortness of breath. Use this with a spacer - we will show you how.  Flu shot up to date Follow with Dr Lamonte Sakai in 4 months or sooner if you have any problems.  Baltazar Apo, MD, PhD 06/20/2016, 10:04 AM [facility] [phone] or if no answer [phone]

## 2016-06-23 ENCOUNTER — Ambulatory Visit: Payer: Medicare Other | Admitting: Family

## 2016-06-27 ENCOUNTER — Other Ambulatory Visit: Payer: Self-pay | Admitting: Family

## 2016-06-27 ENCOUNTER — Other Ambulatory Visit: Payer: Self-pay | Admitting: Physician Assistant

## 2016-06-27 ENCOUNTER — Other Ambulatory Visit: Payer: Self-pay | Admitting: Emergency Medicine

## 2016-06-30 ENCOUNTER — Encounter: Payer: Self-pay | Admitting: Family

## 2016-06-30 ENCOUNTER — Ambulatory Visit (INDEPENDENT_AMBULATORY_CARE_PROVIDER_SITE_OTHER): Payer: Medicare Other | Admitting: Family

## 2016-06-30 ENCOUNTER — Telehealth: Payer: Self-pay

## 2016-06-30 VITALS — BP 118/76 | HR 105 | Temp 98.4°F | Ht 62.0 in | Wt 139.0 lb

## 2016-06-30 DIAGNOSIS — J449 Chronic obstructive pulmonary disease, unspecified: Secondary | ICD-10-CM | POA: Diagnosis not present

## 2016-06-30 DIAGNOSIS — E538 Deficiency of other specified B group vitamins: Secondary | ICD-10-CM

## 2016-06-30 DIAGNOSIS — F259 Schizoaffective disorder, unspecified: Secondary | ICD-10-CM | POA: Diagnosis not present

## 2016-06-30 MED ORDER — CYANOCOBALAMIN 1000 MCG/ML IJ SOLN
1000.0000 ug | Freq: Once | INTRAMUSCULAR | Status: AC
  Administered 2016-06-30: 1000 ug via INTRAMUSCULAR

## 2016-06-30 NOTE — Progress Notes (Signed)
Subjective:    Patient ID: Abigail Castillo, female    DOB: 11-20-1950, 66 y.o.   MRN: [id number]  HPI  Ms.  Abigail Castillo is a 66 yr old female who presents today for routine follow up. Her daughter is with her today. She unfortunately lost her son to lung cancer 06/01/16.   1) COPD- maintained on stiolto.  Reports breathing is stable. Uses oxygen 2L via nasal cannula PRN.    2) Schizoaffecive Disorder- maintaind on zyprexa/celexa, lorazepam. She is followed by Dr. Lendon [facility]. Last office note is reviewed in care everywhere.   3) B12 deficiency- b12 injection today.    Review of Systems See HPI  Past Medical History:  Diagnosis Date  . Anxiety   . Blood transfusion   . Blood transfusion without reported diagnosis   . Chronic mental illness   . COPD (chronic obstructive pulmonary disease) ([place])   . DEPRESSION 05/31/2009  . Emphysema of lung ([place])   . Esophageal stricture 03/14/2015  . History of home oxygen therapy   . History of pneumonia   . Osteoporosis   . Shortness of breath dyspnea   . Tobacco abuse    ongoing  . Vitamin B 12 deficiency   . Vitamin D deficiency      Social History   Social History  . Marital status: Divorced    Spouse name: N/A  . Number of children: 3  . Years of education: N/A   Occupational History  . disabled Unemployed   Social History Main Topics  . Smoking status: Former Smoker    Types: Cigarettes    Quit date: 08/12/2011  . Smokeless tobacco: Never Used  . Alcohol use No  . Drug use: No  . Sexual activity: Not on file   Other Topics Concern  . Not on file   Social History Narrative   Lost her son to Lung CA 1/18   Lives in a group home   Has a daughter who lives locally    Past Surgical History:  Procedure Laterality Date  . ABDOMINAL HYSTERECTOMY    . BREAST LUMPECTOMY  1972   left breast  . BREAST LUMPECTOMY WITH RADIOACTIVE SEED LOCALIZATION Left 11/22/2015   Procedure: LEFT BREAST  LUMPECTOMY WITH RADIOACTIVE SEED LOCALIZATION;  Surgeon: Erroll Luna, MD;  Location: [facility];  Service: General;  Laterality: Left;  . BREAST SURGERY  ?2015   lumpectomy, left  . CERVICAL CONE BIOPSY    . CHOLECYSTECTOMY    . COLONOSCOPY    . ESOPHAGOGASTRODUODENOSCOPY (EGD) WITH ESOPHAGEAL DILATION    . UPPER GASTROINTESTINAL ENDOSCOPY      Family History  Problem Relation Age of Onset  . Coronary artery disease Mother   . Diabetes Mother   . Hypertension Mother   . Coronary artery disease Father   . Diabetes Father     Grandfather  . Hypertension Father   . Stroke Father   . Other Father     colostomy bag  . Breast cancer Sister   . Cancer Other     niece--breast  . Diabetes Sister   . Hypertension Sister   . Mental illness Sister     x 4  . Esophageal cancer Neg Hx   . Stomach cancer Neg Hx   . Rectal cancer Neg Hx   . Colon cancer Neg Hx   . Colon polyps Neg Hx     Allergies  Allergen Reactions  . Sulfonamide Derivatives Hives  Current Outpatient Prescriptions on File Prior to Visit  Medication Sig Dispense Refill  . albuterol (VENTOLIN HFA) 108 (90 Base) MCG/ACT inhaler INHALE TWO   PUFFS INTO THE LUNGS EVERY 6 (SIX) HOURS AS NEEDED FOR WHEEZING OR SHORTNESS OF BREATH. 18 g 5  . ALPRAZolam (XANAX) 0.25 MG tablet TAKE ONE TABLET BY MOUTH DAILY AS NEEDED 30 tablet 0  . budesonide-formoterol (SYMBICORT) 80-4.5 MCG/ACT inhaler Inhale 2 puffs into the lungs 2 (two) times daily.    . Calcium Carbonate-Vitamin D (CALTRATE 600+D) 600-400 MG-UNIT per tablet Take 1 tablet by mouth 2 (two) times daily.    . cholecalciferol (VITAMIN D) 1000 UNITS tablet Take 3,000 Units by mouth daily.     . citalopram (CELEXA) 20 MG tablet TAKE 1 TABLET BY MOUTH ONCE DAILY 30 tablet 5  . citalopram (CELEXA) 20 MG tablet Take 1 tablet daily    . dexlansoprazole (DEXILANT) 60 MG capsule Take 1 capsule (60 mg total) by mouth daily. 30 capsule 5  . guaiFENesin (MUCINEX) 600 MG 12 hr tablet  Take 1,200 mg by mouth 2 (two) times daily as needed for congestion. Reported on 09/11/2015    . HYDROcodone-acetaminophen (NORCO) 5-325 MG tablet Take 1-2 tablets by mouth every 6 (six) hours as needed for moderate pain. 30 tablet 0  . ipratropium-albuterol (DUONEB) 0.5-2.5 (3) MG/3ML SOLN Take 3 mLs by nebulization 4 (four) times daily as needed. 360 mL 3  . loperamide (IMODIUM) 2 MG capsule TAKE 1 CAPSULE   (2 MG TOTAL) BY MOUTH AS NEEDED FOR DIARRHEA OR LOOSE STOOLS. 30 capsule 1  . LORazepam (ATIVAN) 0.5 MG tablet Take 1 tab twice a day as needed for anxiety    . OLANZapine (ZYPREXA) 5 MG tablet TAKE 1 TABLET BY MOUTH DAILY AT BEDTIME 30 tablet 5  . OLANZapine (ZYPREXA) 5 MG tablet Take 1 tab at night    . OXYGEN Inhale 3 L into the lungs.    Marland Kitchen STIOLTO RESPIMAT 2.5-2.5 MCG/ACT AERS INHALE TWO PUFFS INTO THE LUNGS DAILY. 1 Inhaler 5  . Tiotropium Bromide-Olodaterol (STIOLTO RESPIMAT) 2.5-2.5 MCG/ACT AERS Inhale 2 puffs into the lungs daily. 1 Inhaler 5   No current facility-administered medications on file prior to visit.     BP 118/76 (BP Location: Right Arm, Cuff Size: Normal)   Pulse (!) 105   Temp 98.4 F (36.9 C) (Oral)   Ht 5\' 2"  (1.575 m)   Wt 139 lb (63 kg)   SpO2 98%   BMI 25.42 kg/m       Objective:   Physical Exam  Constitutional: She is oriented to person, place, and time. She appears well-developed and well-nourished.  Cardiovascular: Normal rate, regular rhythm and normal heart sounds.   No murmur heard. Pulmonary/Chest: Effort normal and breath sounds normal. No respiratory distress. She has no wheezes.  Neurological: She is alert and oriented to person, place, and time.  Psychiatric: Her behavior is normal.          Assessment & Plan:

## 2016-06-30 NOTE — Assessment & Plan Note (Signed)
Stable, management per psychiatry.

## 2016-06-30 NOTE — Progress Notes (Signed)
Pre visit review using our clinic review tool, if applicable. No additional management support is needed unless otherwise documented below in the visit note. 

## 2016-06-30 NOTE — Assessment & Plan Note (Addendum)
b12 shot today.  Last cbc was WNL.

## 2016-06-30 NOTE — Assessment & Plan Note (Signed)
Stable on current medications, oxygen prn. Management per pulmonology.

## 2016-07-04 ENCOUNTER — Other Ambulatory Visit: Payer: Self-pay | Admitting: Family

## 2016-07-09 ENCOUNTER — Other Ambulatory Visit: Payer: Self-pay | Admitting: Family

## 2016-07-10 NOTE — Telephone Encounter (Signed)
Ok to send 30 tabs zero refills. 

## 2016-07-11 NOTE — Telephone Encounter (Signed)
Noted and agree. 

## 2016-07-11 NOTE — Telephone Encounter (Signed)
Attempted to call in alprazolam and was notified pt Abigail Castillo at [facility] that pt received lorazepam from Dr Erling Cruz in December 26/2017. She did not have record of any further refills of that medication. We had also called in alprazolam on 05/05/16.  Per verbal from PCP, pt has filled lorazepam 0.5mg  twice  A day, #60 on 06/18/16 according to the [facility] Controlled Substance Registry. Spoke with pt's daughter, she states pt no longer lives at the group home and has moved in with one of her sons. Pt is no longer using [employer]. Reports that psychiatry prescribed lorazepam in December because pt was having trouble sleeping due one of her sons being terminally ill. He has since passed away. Advised daughter, Abigail Castillo to contact Dr Tressia Danas office and let him know that PCP no longer feels comfortable prescribing alprazolam if pt is going to continue prescribing lorazepam and he should resume management of alprazolam if he feels pt needs to continue both medications. Candy voices understanding.

## 2016-07-16 NOTE — Telephone Encounter (Signed)
Opened in error

## 2016-07-29 ENCOUNTER — Ambulatory Visit (INDEPENDENT_AMBULATORY_CARE_PROVIDER_SITE_OTHER): Payer: Medicare Other

## 2016-07-29 ENCOUNTER — Other Ambulatory Visit: Payer: Self-pay | Admitting: Family

## 2016-07-29 DIAGNOSIS — E538 Deficiency of other specified B group vitamins: Secondary | ICD-10-CM

## 2016-07-29 MED ORDER — CYANOCOBALAMIN 1000 MCG/ML IJ SOLN
1000.0000 ug | Freq: Once | INTRAMUSCULAR | Status: AC
  Administered 2016-07-29: 1000 ug via INTRAMUSCULAR

## 2016-07-29 NOTE — Progress Notes (Signed)
Pre visit review using our clinic tool,if applicable. No additional management support is needed unless otherwise documented below in the visit note.   Patient in today for B12 injection per order from [name], NP  Patient given 1000 mcg IM right deltoid. No complaints voiced. Return appointment given to patient for 1 month.

## 2016-08-13 ENCOUNTER — Telehealth: Payer: Self-pay | Admitting: Family

## 2016-08-13 NOTE — Telephone Encounter (Signed)
Attempted to call patient to schedule awv. Patient did not answer. Will try to call patient at a later time.  °

## 2016-08-25 ENCOUNTER — Other Ambulatory Visit: Payer: Self-pay | Admitting: Family

## 2016-08-29 ENCOUNTER — Ambulatory Visit (INDEPENDENT_AMBULATORY_CARE_PROVIDER_SITE_OTHER): Payer: Medicare Other

## 2016-08-29 DIAGNOSIS — E538 Deficiency of other specified B group vitamins: Secondary | ICD-10-CM

## 2016-08-29 MED ORDER — CYANOCOBALAMIN 1000 MCG/ML IJ SOLN
1000.0000 ug | Freq: Once | INTRAMUSCULAR | Status: AC
  Administered 2016-08-29: 1000 ug via INTRAMUSCULAR

## 2016-08-29 NOTE — Progress Notes (Signed)
Pre visit review using our clinic tool,if applicable. No additional management support is needed unless otherwise documented below in the visit note.   Patient in for B12 injection due to B12 deficiency per order from [name], NP.   Given 1000 mcg IM left deltoid. Patient tolerated well. Return appointment given for 1 month.

## 2016-09-16 DIAGNOSIS — F259 Schizoaffective disorder, unspecified: Secondary | ICD-10-CM | POA: Diagnosis not present

## 2016-09-25 ENCOUNTER — Other Ambulatory Visit: Payer: Self-pay | Admitting: Family

## 2016-09-26 ENCOUNTER — Ambulatory Visit (INDEPENDENT_AMBULATORY_CARE_PROVIDER_SITE_OTHER): Payer: Medicare Other | Admitting: Behavioral Health

## 2016-09-26 DIAGNOSIS — E538 Deficiency of other specified B group vitamins: Secondary | ICD-10-CM | POA: Diagnosis not present

## 2016-09-26 MED ORDER — CYANOCOBALAMIN 1000 MCG/ML IJ SOLN
1000.0000 ug | Freq: Once | INTRAMUSCULAR | Status: AC
  Administered 2016-09-26: 1000 ug via INTRAMUSCULAR

## 2016-09-26 NOTE — Progress Notes (Signed)
Noted  

## 2016-09-26 NOTE — Progress Notes (Signed)
Pre visit review using our clinic review tool, if applicable. No additional management support is needed unless otherwise documented below in the visit note.  Patient in clinic today for monthly B12 injection. IM injection given in the left deltoid. Patient tolerated it well. Next appointment 10/27/16 at 8:30 AM.

## 2016-09-30 ENCOUNTER — Encounter: Payer: Self-pay | Admitting: Emergency Medicine

## 2016-09-30 ENCOUNTER — Ambulatory Visit (INDEPENDENT_AMBULATORY_CARE_PROVIDER_SITE_OTHER): Payer: Medicare Other | Admitting: Emergency Medicine

## 2016-09-30 DIAGNOSIS — J449 Chronic obstructive pulmonary disease, unspecified: Secondary | ICD-10-CM

## 2016-09-30 NOTE — Telephone Encounter (Signed)
Refill sent per [facility] refill protocol/SLS  

## 2016-09-30 NOTE — Progress Notes (Signed)
Subjective:    Patient ID: Abigail Castillo, female    DOB: [birth year], 66 y.o.   MRN: [id number]  HPI 66 year old former smoker with a documented history of COPD made years ago, believes that she had spirometry a long time ago, cant recall where. Also with a history of osteoporosis and depression, B12 deficiency. She is followed by [facility] primary care. She was seen in ED 3/17 for an AE-COPD, was treated with pred. She states that she exacerbates frequently. She is on Symbicort, has used it for years. Uses SABA at least once a day. She typically has daily cough, has been better lately. Has exertional SOB, is limited by her breathing.   [name] 01/14/16 -- Follow-up visit for former tobacco use and COPD. I saw her in April 2017 and she was on Symbicort. We have performed pulmonary function testing today and I have reviewed the results. She has very severe obstruction without a bronchodilator response. She was unable to do the diffusion capacity or the lung volumes.  She is only using Symbicort prn, albuterol prn. She has minimal cough, prod of phlegm. Occasional wheeze. No flares since last time.   [name] 03/24/16 -- This is a follow-up visit for COPD with very severe obstruction. At her last visit we tried to start Stiolto to see if she would benefit. Unfortunately she was not able to obtain this due to poor insurance coverage. She was treated for an acute flare by PCP 2 weeks ago. Her only BD right now is Symbicort prn. She has albuterol nebs available but does not use / need right now. Her O2 is at 2L/min at all times.   [name] 06/20/16 -- Patient has a history of severe obstructive lung disease, COPD. She was able to get Stiolto, has been using it reliable. Having less exertional dyspnea. Still does still have occasional wheeze, cough. She doesn't have an albuterol right now.   [name] 09/30/16 -- This is a follow-up visit for severe COPD. She has associated exertional hypoxemia. She reports that her breathing has  been stable - she remains limited with walking, especially without her O2 on. Uses 2L/min. She averages about 1x a day SABA. No flares since last time. She no longer uses DuoNeb. She uses mucinex prn. Minimal cough, does have some wheeze in the am's.    Review of Systems  Constitutional: Negative for fever and unexpected weight change.  HENT: Negative for congestion, dental problem, ear pain, nosebleeds, postnasal drip, rhinorrhea, sinus pressure, sneezing, sore throat and trouble swallowing.   Eyes: Negative for redness and itching.  Respiratory: Positive for shortness of breath. Negative for cough, chest tightness and wheezing.   Cardiovascular: Negative for palpitations and leg swelling.  Gastrointestinal: Negative for nausea and vomiting.  Genitourinary: Negative for dysuria.  Musculoskeletal: Negative for joint swelling.  Skin: Negative for rash.  Neurological: Negative for headaches.  Hematological: Does not bruise/bleed easily.  Psychiatric/Behavioral: Negative for dysphoric mood. The patient is not nervous/anxious.     Past Medical History:  Diagnosis Date  . Anxiety   . Blood transfusion   . Blood transfusion without reported diagnosis   . Chronic mental illness   . COPD (chronic obstructive pulmonary disease) ([place])   . DEPRESSION 05/31/2009  . Emphysema of lung ([place])   . Esophageal stricture 03/14/2015  . History of home oxygen therapy   . History of pneumonia   . Osteoporosis   . Shortness of breath dyspnea   . Tobacco abuse    ongoing  .  Vitamin B 12 deficiency   . Vitamin D deficiency      Family History  Problem Relation Age of Onset  . Coronary artery disease Mother   . Diabetes Mother   . Hypertension Mother   . Coronary artery disease Father   . Diabetes Father     Grandfather  . Hypertension Father   . Stroke Father   . Other Father     colostomy bag  . Breast cancer Sister   . Cancer Other     niece--breast  . Diabetes Sister   . Hypertension  Sister   . Mental illness Sister     x 4  . Esophageal cancer Neg Hx   . Stomach cancer Neg Hx   . Rectal cancer Neg Hx   . Colon cancer Neg Hx   . Colon polyps Neg Hx      Social History   Social History  . Marital status: Divorced    Spouse name: N/A  . Number of children: 3  . Years of education: N/A   Occupational History  . disabled Unemployed   Social History Main Topics  . Smoking status: Former Smoker    Types: Cigarettes    Quit date: 08/12/2011  . Smokeless tobacco: Never Used  . Alcohol use No  . Drug use: No  . Sexual activity: Not on file   Other Topics Concern  . Not on file   Social History Narrative   Lost her son to Lung CA 1/18   Lives in a group home   Has a daughter who lives locally     Allergies  Allergen Reactions  . Sulfonamide Derivatives Hives     Outpatient Medications Prior to Visit  Medication Sig Dispense Refill  . ALPRAZolam (XANAX) 0.25 MG tablet TAKE ONE TABLET BY MOUTH DAILY AS NEEDED 30 tablet 0  . budesonide-formoterol (SYMBICORT) 80-4.5 MCG/ACT inhaler Inhale 2 puffs into the lungs 2 (two) times daily.    . Calcium Carbonate-Vitamin D (CALTRATE 600+D) 600-400 MG-UNIT per tablet Take 1 tablet by mouth 2 (two) times daily.    . cholecalciferol (VITAMIN D) 1000 UNITS tablet Take 3,000 Units by mouth daily.     . citalopram (CELEXA) 20 MG tablet TAKE 1 TABLET BY MOUTH ONCE DAILY 30 tablet 2  . DEXILANT 60 MG capsule Take 1 capsule (60 mg total) by mouth daily. 30 capsule 5  . guaiFENesin (MUCINEX) 600 MG 12 hr tablet Take 1,200 mg by mouth 2 (two) times daily as needed for congestion. Reported on 09/11/2015    . ipratropium-albuterol (DUONEB) 0.5-2.5 (3) MG/3ML SOLN Take 3 mLs by nebulization 4 (four) times daily as needed. 360 mL 3  . loperamide (IMODIUM) 2 MG capsule TAKE 1 CAPSULE   (2 MG TOTAL) BY MOUTH AS NEEDED FOR DIARRHEA OR LOOSE STOOLS. 30 capsule 1  . LORazepam (ATIVAN) 0.5 MG tablet Take 1 tab twice a day as needed for  anxiety    . OLANZapine (ZYPREXA) 5 MG tablet Take 1 tab at night    . OXYGEN Inhale 3 L into the lungs.    . Tiotropium Bromide-Olodaterol (STIOLTO RESPIMAT) 2.5-2.5 MCG/ACT AERS Inhale 2 puffs into the lungs daily. 1 Inhaler 5  . VENTOLIN HFA 108 (90 Base) MCG/ACT inhaler INHALE TWO   PUFFS INTO THE LUNGS EVERY 6 (SIX) HOURS AS NEEDED FOR WHEEZING OR SHORTNESS OF BREATH. 18 Inhaler 2   No facility-administered medications prior to visit.  Objective:   Physical Exam  Vitals:   09/30/16 0906  BP: 102/70  Pulse: 98  SpO2: 94%  Weight: 136 lb 3.2 oz (61.8 kg)  Height: 5\' 2"  (1.575 m)   Gen: Pleasant, well-nourished, in no distress  ENT: No lesions,  Edentulous, mouth clear,  oropharynx clear, no postnasal drip  Neck: No JVD, no TMG, no carotid bruits  Lungs: No use of accessory muscles,  clear without rales or rhonchi  Cardiovascular: RRR, heart sounds normal, no murmur or gallops, no peripheral edema  Musculoskeletal: No deformities, no cyanosis or clubbing  Neuro: alert, non focal, oriented   Skin: Warm, no lesions or rashes       Assessment & Plan:  COPD (chronic obstructive pulmonary disease) ([place]) She is doing well on current regimen. Stable functional capacity although significantly limited. No exacerbations. We will continue her same regimen  Please continue Stiolto 2 puffs once a day Use your albuterol 2 puffs as needed for shortness of breath Continue Mucinex as needed for cough and drainage Continue your oxygen 2 L/m with all exertion and while sleeping You will need the flu shot next fall You will be due for the Prevnar 13 pneumonia shot after age 33 Follow with Dr Lamonte Sakai in 5 months or sooner if you have any problems  Baltazar Apo, MD, PhD 09/30/2016, 9:30 AM [facility] [phone] or if no answer [phone]

## 2016-09-30 NOTE — Assessment & Plan Note (Signed)
She is doing well on current regimen. Stable functional capacity although significantly limited. No exacerbations. We will continue her same regimen  Please continue Stiolto 2 puffs once a day Use your albuterol 2 puffs as needed for shortness of breath Continue Mucinex as needed for cough and drainage Continue your oxygen 2 L/m with all exertion and while sleeping You will need the flu shot next fall You will be due for the Prevnar 13 pneumonia shot after age 45 Follow with Dr Lamonte Sakai in 5 months or sooner if you have any problems

## 2016-09-30 NOTE — Patient Instructions (Signed)
Please continue Stiolto 2 puffs once a day Use your albuterol 2 puffs as needed for shortness of breath Continue Mucinex as needed for cough and drainage Continue your oxygen 2 L/m with all exertion and while sleeping You will need the flu shot next fall You will be due for the Prevnar 13 pneumonia shot after age 66 Follow with Dr Lamonte Sakai in 5 months or sooner if you have any problems

## 2016-10-27 ENCOUNTER — Telehealth: Payer: Self-pay | Admitting: Family

## 2016-10-27 ENCOUNTER — Ambulatory Visit: Payer: Medicare Other | Admitting: Family

## 2016-10-27 NOTE — Telephone Encounter (Signed)
Called patient to schedule awv. Pt did not answer. Will try to call pt at a later time.

## 2016-10-27 NOTE — Telephone Encounter (Signed)
Patient called at 8:01 stating she was [place] traffic on the highway and was not going to make her appointment. Charge or No Charge?

## 2016-10-27 NOTE — Telephone Encounter (Signed)
No charge. 

## 2016-11-10 ENCOUNTER — Encounter: Payer: Self-pay | Admitting: Family

## 2016-11-10 ENCOUNTER — Ambulatory Visit (INDEPENDENT_AMBULATORY_CARE_PROVIDER_SITE_OTHER): Payer: Medicare Other | Admitting: Family

## 2016-11-10 VITALS — BP 123/83 | HR 90 | Temp 98.2°F | Resp 16 | Ht 62.0 in | Wt 132.2 lb

## 2016-11-10 DIAGNOSIS — J449 Chronic obstructive pulmonary disease, unspecified: Secondary | ICD-10-CM | POA: Diagnosis not present

## 2016-11-10 DIAGNOSIS — F259 Schizoaffective disorder, unspecified: Secondary | ICD-10-CM

## 2016-11-10 DIAGNOSIS — E538 Deficiency of other specified B group vitamins: Secondary | ICD-10-CM | POA: Diagnosis not present

## 2016-11-10 DIAGNOSIS — Z0000 Encounter for general adult medical examination without abnormal findings: Secondary | ICD-10-CM | POA: Diagnosis not present

## 2016-11-10 DIAGNOSIS — Z1231 Encounter for screening mammogram for malignant neoplasm of breast: Secondary | ICD-10-CM

## 2016-11-10 MED ORDER — CYANOCOBALAMIN 1000 MCG/ML IJ SOLN
1000.0000 ug | Freq: Once | INTRAMUSCULAR | Status: AC
  Administered 2016-11-10: 1000 ug via INTRAMUSCULAR

## 2016-11-10 NOTE — Addendum Note (Signed)
Addended by: Kelle Darting A on: 11/10/2016 11:32 AM   Modules accepted: Orders

## 2016-11-10 NOTE — Assessment & Plan Note (Signed)
Stable, management per psychiatry.

## 2016-11-10 NOTE — Assessment & Plan Note (Signed)
Stable on oxygen, managed by pulmonology. I did give her an rx for a wheelchair.

## 2016-11-10 NOTE — Assessment & Plan Note (Signed)
b12 injection given today

## 2016-11-10 NOTE — Progress Notes (Signed)
Subjective:    Patient ID: Abigail Castillo, female    DOB: 1950-11-19, 66 y.o.   MRN: [id number]  HPI  Abigail Castillo is a 66 yr old female who presents today for follow up.  1) B12 deficiency- she continues b12 injections. Had injection today.   2) COPD-  Reports difficulty walking long distances and is requesting rx for a wheelchair.  She is following with Abigail Castillo (pulmonary) and last saw him on 09/30/16.  She is maintained on stiolto.    3) Schizoaffective disorder- reports stable mood. She is working with psychiatry, Abigail Castillo.     Review of Systems See HPI  Past Medical History:  Diagnosis Date  . Anxiety   . Blood transfusion   . Blood transfusion without reported diagnosis   . Chronic mental illness   . COPD (chronic obstructive pulmonary disease) ([place])   . DEPRESSION 05/31/2009  . Emphysema of lung ([place])   . Esophageal stricture 03/14/2015  . History of home oxygen therapy   . History of pneumonia   . Osteoporosis   . Shortness of breath dyspnea   . Tobacco abuse    ongoing  . Vitamin B 12 deficiency   . Vitamin D deficiency      Social History   Social History  . Marital status: Divorced    Spouse name: N/A  . Number of children: 3  . Years of education: N/A   Occupational History  . disabled Unemployed   Social History Main Topics  . Smoking status: Former Smoker    Types: Cigarettes    Quit date: 08/12/2011  . Smokeless tobacco: Never Used  . Alcohol use No  . Drug use: No  . Sexual activity: Not on file   Other Topics Concern  . Not on file   Social History Narrative   Lost her son to Lung CA 1/18   Lives in a group home   Has a daughter who lives locally    Past Surgical History:  Procedure Laterality Date  . ABDOMINAL HYSTERECTOMY    . BREAST LUMPECTOMY  1972   left breast  . BREAST LUMPECTOMY WITH RADIOACTIVE SEED LOCALIZATION Left 11/22/2015   Procedure: LEFT BREAST LUMPECTOMY WITH RADIOACTIVE SEED LOCALIZATION;  Surgeon: Erroll Luna, MD;  Location: [facility];  Service: General;  Laterality: Left;  . BREAST SURGERY  ?2015   lumpectomy, left  . CERVICAL CONE BIOPSY    . CHOLECYSTECTOMY    . COLONOSCOPY    . ESOPHAGOGASTRODUODENOSCOPY (EGD) WITH ESOPHAGEAL DILATION    . UPPER GASTROINTESTINAL ENDOSCOPY      Family History  Problem Relation Age of Onset  . Coronary artery disease Mother   . Diabetes Mother   . Hypertension Mother   . Coronary artery disease Father   . Diabetes Father        Grandfather  . Hypertension Father   . Stroke Father   . Other Father        colostomy bag  . Breast cancer Sister   . Cancer Other        niece--breast  . Diabetes Sister   . Hypertension Sister   . Mental illness Sister        x 4  . Esophageal cancer Neg Hx   . Stomach cancer Neg Hx   . Rectal cancer Neg Hx   . Colon cancer Neg Hx   . Colon polyps Neg Hx     Allergies  Allergen Reactions  .  Sulfonamide Derivatives Hives    Current Outpatient Prescriptions on File Prior to Visit  Medication Sig Dispense Refill  . ALPRAZolam (XANAX) 0.25 MG tablet TAKE ONE TABLET BY MOUTH DAILY AS NEEDED 30 tablet 0  . budesonide-formoterol (SYMBICORT) 80-4.5 MCG/ACT inhaler Inhale 2 puffs into the lungs 2 (two) times daily.    . cholecalciferol (VITAMIN D) 1000 UNITS tablet Take 3,000 Units by mouth daily.     . citalopram (CELEXA) 20 MG tablet TAKE 1 TABLET BY MOUTH ONCE DAILY 30 tablet 2  . DEXILANT 60 MG capsule Take 1 capsule (60 mg total) by mouth daily. 30 capsule 5  . guaiFENesin (MUCINEX) 600 MG 12 hr tablet Take 1,200 mg by mouth 2 (two) times daily as needed for congestion. Reported on 09/11/2015    . ipratropium-albuterol (DUONEB) 0.5-2.5 (3) MG/3ML SOLN Take 3 mLs by nebulization 4 (four) times daily as needed. 360 mL 3  . loperamide (IMODIUM) 2 MG capsule TAKE 1 CAPSULE   (2 MG TOTAL) BY MOUTH AS NEEDED FOR DIARRHEA OR LOOSE STOOLS. 30 capsule 1  . LORazepam (ATIVAN) 0.5 MG tablet Take 1 tab twice a day as  needed for anxiety    . OLANZapine (ZYPREXA) 5 MG tablet Take 1 tab at night    . OXYGEN Inhale 3 L into the lungs.    . Tiotropium Bromide-Olodaterol (STIOLTO RESPIMAT) 2.5-2.5 MCG/ACT AERS Inhale 2 puffs into the lungs daily. 1 Inhaler 5  . VENTOLIN HFA 108 (90 Base) MCG/ACT inhaler INHALE TWO   PUFFS INTO THE LUNGS EVERY 6 (SIX) HOURS AS NEEDED FOR WHEEZING OR SHORTNESS OF BREATH. 18 Inhaler 2  . Calcium Carbonate-Vitamin D (CALTRATE 600+D) 600-400 MG-UNIT per tablet Take 1 tablet by mouth 2 (two) times daily.     No current facility-administered medications on file prior to visit.     BP 123/83 (BP Location: Right Arm, Cuff Size: Normal)   Pulse 90   Temp 98.2 F (36.8 C) (Oral)   Resp 16   Ht 5\' 2"  (1.575 m)   Wt 132 lb 3.2 oz (60 kg)   SpO2 98% Comment: O2 @ 2L/min  BMI 24.18 kg/m       Objective:   Physical Exam  Constitutional: She is oriented to person, place, and time. She appears well-developed and well-nourished.  Cardiovascular: Normal rate, regular rhythm and normal heart sounds.   No murmur heard. Pulmonary/Chest: Effort normal and breath sounds normal. No respiratory distress. She has no wheezes.  Neurological: She is alert and oriented to person, place, and time.  Psychiatric: Her speech is normal and behavior is normal. Thought content normal. Her affect is blunt. Cognition and memory are normal.          Assessment & Plan:

## 2016-11-13 ENCOUNTER — Telehealth: Payer: Self-pay | Admitting: Family

## 2016-11-13 NOTE — Telephone Encounter (Signed)
Yes, we will do B12 at AWV. I have noted this in her appt notes. Thanks.

## 2016-11-13 NOTE — Telephone Encounter (Addendum)
Caller name: Prochazka,Candace Relation to BF:[id number]  Call back number:[phone]   Reason for call:  Patient scheduled AWV with Glenard Haring for 12/11/2016, patient would like to know if her B12 can be given at the time, please advise

## 2016-11-13 NOTE — Telephone Encounter (Signed)
Please contact patient and let her know that her insurance is requiring that she need the wheelchair in the home to complete ADL's such as grooming, bathing and toileting  At this point I don't think that she needs it for these activities.  She will likely have to purchase a wheelchair. New wheelchairs cost about $100 but she may be able to find one used for less.

## 2016-11-17 ENCOUNTER — Other Ambulatory Visit: Payer: Self-pay | Admitting: Family

## 2016-11-17 NOTE — Telephone Encounter (Signed)
Left message at home # to return my call.

## 2016-11-17 NOTE — Telephone Encounter (Signed)
Notified pt's daughter and she voices understanding. 

## 2016-12-08 NOTE — Progress Notes (Addendum)
Subjective:   Abigail Castillo is a 66 y.o. female who presents for Medicare Annual (Subsequent) preventive examination.  Accompanied by daughter. Pt is pleasant. Currently on O2 via [place] @2L .  Review of Systems:  No ROS.  Medicare Wellness Visit. Additional risk factors are reflected in the social history.  Cardiac Risk Factors include: advanced age (>40men, >27 women);dyslipidemia;sedentary lifestyle Sleep patterns:   Takes Zyprexa at night. Sleeps about 8 hrs per night. Feels rested.  Naps if needed. Home Safety/Smoke Alarms: Feels safe in home. Smoke alarms in place.  Living environment; residence and Firearm Safety: Lives with son who is rarely home. Daughter lives within walking distance.No stairs. No guns. Seat Belt Safety/Bike Helmet: Wears seat belt.   Counseling:   Eye Exam- Reading glasses. States will make eye doctor appt. Dental- edentulous. Gum care discusses.   Female:   Pap- Hysterectomy      Mammo-    Scheduled today downstairs. Dexa scan- Last 02/08/14: osteoporosis. ORDERED TODAY    CCS- Last 02/12/12: adenomatous polyp removed. Recall 5 yrs    Objective:     Vitals: BP 110/72 (BP Location: Right Arm, Patient Position: Sitting, Cuff Size: Normal)   Pulse 97   Ht 5\' 2"  (1.575 m)   Wt 130 lb (59 kg)   SpO2 97%   BMI 23.78 kg/m   Body mass index is 23.78 kg/m.   Tobacco History  Smoking Status  . Former Smoker  . Types: Cigarettes  . Quit date: 08/12/2011  Smokeless Tobacco  . Never Used     Counseling given: No   Past Medical History:  Diagnosis Date  . Anxiety   . Blood transfusion   . Blood transfusion without reported diagnosis   . Chronic mental illness   . COPD (chronic obstructive pulmonary disease) ([place])   . DEPRESSION 05/31/2009  . Emphysema of lung ([place])   . Esophageal stricture 03/14/2015  . History of home oxygen therapy   . History of pneumonia   . Osteoporosis   . Shortness of breath dyspnea   . Tobacco abuse    ongoing  .  Vitamin B 12 deficiency   . Vitamin D deficiency    Past Surgical History:  Procedure Laterality Date  . ABDOMINAL HYSTERECTOMY    . BREAST LUMPECTOMY  1972   left breast  . BREAST LUMPECTOMY WITH RADIOACTIVE SEED LOCALIZATION Left 11/22/2015   Procedure: LEFT BREAST LUMPECTOMY WITH RADIOACTIVE SEED LOCALIZATION;  Surgeon: Erroll Luna, MD;  Location: [facility];  Service: General;  Laterality: Left;  . BREAST SURGERY  ?2015   lumpectomy, left  . CERVICAL CONE BIOPSY    . CHOLECYSTECTOMY    . COLONOSCOPY    . ESOPHAGOGASTRODUODENOSCOPY (EGD) WITH ESOPHAGEAL DILATION    . UPPER GASTROINTESTINAL ENDOSCOPY     Family History  Problem Relation Age of Onset  . Coronary artery disease Mother   . Diabetes Mother   . Hypertension Mother   . Coronary artery disease Father   . Diabetes Father        Grandfather  . Hypertension Father   . Stroke Father   . Other Father        colostomy bag  . Breast cancer Sister   . Cancer Other        niece--breast  . Diabetes Sister   . Hypertension Sister   . Mental illness Sister        x 4  . Esophageal cancer Neg Hx   . Stomach cancer  Neg Hx   . Rectal cancer Neg Hx   . Colon cancer Neg Hx   . Colon polyps Neg Hx    History  Sexual Activity  . Sexual activity: No    Outpatient Encounter Prescriptions as of 12/11/2016  Medication Sig  . budesonide-formoterol (SYMBICORT) 80-4.5 MCG/ACT inhaler Inhale 2 puffs into the lungs 2 (two) times daily.  . Calcium Carbonate-Vitamin D (CALTRATE 600+D) 600-400 MG-UNIT per tablet Take 1 tablet by mouth 2 (two) times daily.  . cholecalciferol (VITAMIN D) 1000 UNITS tablet Take 3,000 Units by mouth daily.   . citalopram (CELEXA) 20 MG tablet TAKE 1 TABLET BY MOUTH ONCE DAILY  . DEXILANT 60 MG capsule Take 1 capsule (60 mg total) by mouth daily.  Marland Kitchen guaiFENesin (MUCINEX) 600 MG 12 hr tablet Take 1,200 mg by mouth 2 (two) times daily as needed for congestion. Reported on 09/11/2015  . loperamide (IMODIUM) 2  MG capsule TAKE 1 CAPSULE   (2 MG TOTAL) BY MOUTH AS NEEDED FOR DIARRHEA OR LOOSE STOOLS.  Marland Kitchen LORazepam (ATIVAN) 0.5 MG tablet Take 1 tab twice a day as needed for anxiety.  Dtr states changed to 1mg   . OLANZapine (ZYPREXA) 5 MG tablet Take 1 tab at night  . OXYGEN Inhale 2 L into the lungs.   . Tiotropium Bromide-Olodaterol (STIOLTO RESPIMAT) 2.5-2.5 MCG/ACT AERS Inhale 2 puffs into the lungs daily.  . VENTOLIN HFA 108 (90 Base) MCG/ACT inhaler INHALE TWO   PUFFS INTO THE LUNGS EVERY 6 (SIX) HOURS AS NEEDED FOR WHEEZING OR SHORTNESS OF BREATH.  Marland Kitchen ALPRAZolam (XANAX) 0.25 MG tablet TAKE ONE TABLET BY MOUTH DAILY AS NEEDED (Patient not taking: Reported on 12/11/2016)  . ipratropium-albuterol (DUONEB) 0.5-2.5 (3) MG/3ML SOLN Take 3 mLs by nebulization 4 (four) times daily as needed. (Patient not taking: Reported on 12/11/2016)   No facility-administered encounter medications on file as of 12/11/2016.     Activities of Daily Living In your present state of health, do you have any difficulty performing the following activities: 12/11/2016 02/04/2016  Hearing? N N  Vision? N N  Difficulty concentrating or making decisions? N N  Walking or climbing stairs? Y N  Dressing or bathing? N Y  Doing errands, shopping? Y -  Conservation officer, nature and eating ? N -  Using the Toilet? N -  In the past six months, have you accidently leaked urine? N -  Do you have problems with loss of bowel control? N -  Managing your Medications? N -  Managing your Finances? Y -  Housekeeping or managing your Housekeeping? N -  Some recent data might be hidden    Patient Care Team: [name], NP as PCP - General (Internal Medicine) Gatha Mayer, MD as Consulting Physician (Gastroenterology) Lendon Colonel, MD as Referring Physician (Psychiatry)    Assessment:    Physical assessment deferred to PCP.  Exercise Activities and Dietary recommendations Current Exercise Habits: The patient does not participate in  regular exercise at present, Exercise limited by: respiratory conditions(s)   Diet (meal preparation, eat out, water intake, caffeinated beverages, dairy products, fruits and vegetables): in general, a "healthy" diet   Breakfast: Banana and eggs Lunch: Salad and chicken Dinner:  Watermelon    Goals    . Increase physical activity          As tolerated.      Fall Risk Fall Risk  12/11/2016 07/13/2015 07/13/2015 02/03/2014  Falls in the past year? No No No No  Depression Screen PHQ 2/9 Scores 12/11/2016 07/13/2015 07/13/2015 02/03/2014  PHQ - 2 Score 0 0 0 0     Cognitive Function MMSE - Mini Mental State Exam 12/11/2016  Orientation to time 5  Orientation to Place 5  Registration 3  Attention/ Calculation 5  Recall 2  Language- name 2 objects 2  Language- repeat 1  Language- follow 3 step command 3  Language- read & follow direction 1  Write a sentence 1  Copy design 1  Total score 29        Immunization History  Administered Date(s) Administered  . Influenza Whole 03/02/2008, 04/09/2010  . Influenza, Seasonal, Injecte, Preservative Fre 06/18/2012  . Influenza,inj,Quad PF,36+ Mos 02/01/2013, 02/03/2014, 02/05/2015, 03/10/2016  . PPD Test 12/17/2010  . Pneumococcal Polysaccharide-23 03/02/2008, 02/01/2013  . Tdap 05/26/2008   Screening Tests Health Maintenance  Topic Date Due  . Hepatitis C Screening  [birth year]  . MAMMOGRAM  09/16/2016  . INFLUENZA VACCINE  12/24/2016  . COLONOSCOPY  02/11/2017  . TETANUS/TDAP  05/26/2018  . HIV Screening  Completed      Plan:   Follow up with PCP as directed.  Continue to eat heart healthy diet (full of fruits, vegetables, whole grains, lean protein, water--limit salt, fat, and sugar intake) and increase physical activity as tolerated.  Continue doing brain stimulating activities (puzzles, reading, adult coloring books, staying active) to keep memory sharp.   Bone scan ordered today. Please schedule.  Vitamin B12 lab  ordered today. We will be in touch with you about results and to schedule injection if need to continue.    I have personally reviewed and noted the following in the patient's chart:   . Medical and social history . Use of alcohol, tobacco or illicit drugs  . Current medications and supplements . Functional ability and status . Nutritional status . Physical activity . Advanced directives . List of other physicians . Hospitalizations, surgeries, and ER visits in previous 12 months . Vitals . Screenings to include cognitive, depression, and falls . Referrals and appointments  In addition, I have reviewed and discussed with patient certain preventive protocols, quality metrics, and best practice recommendations. A written personalized care plan for preventive services as well as general preventive health recommendations were provided to patient.     Shela Nevin, [place]  12/11/2016  After review agree with assessment and plan of RN.(Reviewed in light of her PCP not in office on day or service.  [place], Percell Miller, PA-C

## 2016-12-11 ENCOUNTER — Other Ambulatory Visit: Payer: Self-pay | Admitting: Family

## 2016-12-11 ENCOUNTER — Ambulatory Visit (HOSPITAL_BASED_OUTPATIENT_CLINIC_OR_DEPARTMENT_OTHER)
Admission: RE | Admit: 2016-12-11 | Discharge: 2016-12-11 | Disposition: A | Discharge status: Home / Self Care | Payer: Medicare Other | Source: Ambulatory Visit | Attending: Family | Admitting: Family

## 2016-12-11 ENCOUNTER — Encounter: Payer: Self-pay | Admitting: *Deleted

## 2016-12-11 ENCOUNTER — Encounter: Payer: Self-pay | Admitting: Internal Medicine

## 2016-12-11 ENCOUNTER — Ambulatory Visit (INDEPENDENT_AMBULATORY_CARE_PROVIDER_SITE_OTHER): Payer: Medicare Other | Admitting: *Deleted

## 2016-12-11 ENCOUNTER — Encounter (HOSPITAL_BASED_OUTPATIENT_CLINIC_OR_DEPARTMENT_OTHER): Payer: Self-pay

## 2016-12-11 VITALS — BP 110/72 | HR 97 | Ht 62.0 in | Wt 130.0 lb

## 2016-12-11 DIAGNOSIS — E538 Deficiency of other specified B group vitamins: Secondary | ICD-10-CM

## 2016-12-11 DIAGNOSIS — Z1231 Encounter for screening mammogram for malignant neoplasm of breast: Secondary | ICD-10-CM

## 2016-12-11 DIAGNOSIS — N6310 Unspecified lump in the right breast, unspecified quadrant: Secondary | ICD-10-CM | POA: Insufficient documentation

## 2016-12-11 DIAGNOSIS — Z780 Asymptomatic menopausal state: Secondary | ICD-10-CM

## 2016-12-11 DIAGNOSIS — Z0000 Encounter for general adult medical examination without abnormal findings: Secondary | ICD-10-CM

## 2016-12-11 DIAGNOSIS — R928 Other abnormal and inconclusive findings on diagnostic imaging of breast: Secondary | ICD-10-CM | POA: Insufficient documentation

## 2016-12-11 LAB — VITAMIN B12: Vitamin B-12: 683 pg/mL (ref 211–911)

## 2016-12-11 NOTE — Patient Instructions (Signed)
[name] , Thank you for taking time to come for your Medicare Wellness Visit. I appreciate your ongoing commitment to your health goals. Please review the following plan we discussed and let me know if I can assist you in the future.   These are the goals we discussed: Goals    . Increase physical activity          As tolerated.       This is a list of the screening recommended for you and due dates:  Health Maintenance  Topic Date Due  .  Hepatitis C: One time screening is recommended by Center for Disease Control  (CDC) for  adults born from [birth year] through 1964.   01/12/1951  . Mammogram  09/16/2016  . Flu Shot  12/24/2016  . Colon Cancer Screening  02/11/2017  . Tetanus Vaccine  05/26/2018  . HIV Screening  Completed   Continue to eat heart healthy diet (full of fruits, vegetables, whole grains, lean protein, water--limit salt, fat, and sugar intake) and increase physical activity as tolerated.  Continue doing brain stimulating activities (puzzles, reading, adult coloring books, staying active) to keep memory sharp.   Bone scan ordered today. Please schedule.  Vitamin B12 lab ordered today. We will be in touch with you about results and to schedule injection if need to continue.    Health Maintenance for Postmenopausal Women Menopause is a normal process in which your reproductive ability comes to an end. This process happens gradually over a span of months to years, usually between the ages of 40 and 33. Menopause is complete when you have missed 12 consecutive menstrual periods. It is important to talk with your health care provider about some of the most common conditions that affect postmenopausal women, such as heart disease, cancer, and bone loss (osteoporosis). Adopting a healthy lifestyle and getting preventive care can help to promote your health and wellness. Those actions can also lower your chances of developing some of these common conditions. What should I know  about menopause? During menopause, you may experience a number of symptoms, such as:  Moderate-to-severe hot flashes.  Night sweats.  Decrease in sex drive.  Mood swings.  Headaches.  Tiredness.  Irritability.  Memory problems.  Insomnia.  Choosing to treat or not to treat menopausal changes is an individual decision that you make with your health care provider. What should I know about hormone replacement therapy and supplements? Hormone therapy products are effective for treating symptoms that are associated with menopause, such as hot flashes and night sweats. Hormone replacement carries certain risks, especially as you become older. If you are thinking about using estrogen or estrogen with progestin treatments, discuss the benefits and risks with your health care provider. What should I know about heart disease and stroke? Heart disease, heart attack, and stroke become more likely as you age. This may be due, in part, to the hormonal changes that your body experiences during menopause. These can affect how your body processes dietary fats, triglycerides, and cholesterol. Heart attack and stroke are both medical emergencies. There are many things that you can do to help prevent heart disease and stroke:  Have your blood pressure checked at least every 1-2 years. High blood pressure causes heart disease and increases the risk of stroke.  If you are 73-45 years old, ask your health care provider if you should take aspirin to prevent a heart attack or a stroke.  Do not use any tobacco products, including cigarettes, chewing  tobacco, or electronic cigarettes. If you need help quitting, ask your health care provider.  It is important to eat a healthy diet and maintain a healthy weight. ? Be sure to include plenty of vegetables, fruits, low-fat dairy products, and lean protein. ? Avoid eating foods that are high in solid fats, added sugars, or salt (sodium).  Get regular exercise.  This is one of the most important things that you can do for your health. ? Try to exercise for at least 150 minutes each week. The type of exercise that you do should increase your heart rate and make you sweat. This is known as moderate-intensity exercise. ? Try to do strengthening exercises at least twice each week. Do these in addition to the moderate-intensity exercise.  Know your numbers.Ask your health care provider to check your cholesterol and your blood glucose. Continue to have your blood tested as directed by your health care provider.  What should I know about cancer screening? There are several types of cancer. Take the following steps to reduce your risk and to catch any cancer development as early as possible. Breast Cancer  Practice breast self-awareness. ? This means understanding how your breasts normally appear and feel. ? It also means doing regular breast self-exams. Let your health care provider know about any changes, no matter how small.  If you are 10 or older, have a clinician do a breast exam (clinical breast exam or CBE) every year. Depending on your age, family history, and medical history, it may be recommended that you also have a yearly breast X-ray (mammogram).  If you have a family history of breast cancer, talk with your health care provider about genetic screening.  If you are at high risk for breast cancer, talk with your health care provider about having an MRI and a mammogram every year.  Breast cancer (BRCA) gene test is recommended for women who have family members with BRCA-related cancers. Results of the assessment will determine the need for genetic counseling and BRCA1 and for BRCA2 testing. BRCA-related cancers include these types: ? Breast. This occurs in males or females. ? Ovarian. ? Tubal. This may also be called fallopian tube cancer. ? Cancer of the abdominal or pelvic lining (peritoneal cancer). ? Prostate. ? Pancreatic.  Cervical,  Uterine, and Ovarian Cancer Your health care provider may recommend that you be screened regularly for cancer of the pelvic organs. These include your ovaries, uterus, and vagina. This screening involves a pelvic exam, which includes checking for microscopic changes to the surface of your cervix (Pap test).  For women ages 21-65, health care providers may recommend a pelvic exam and a Pap test every three years. For women ages 32-65, they may recommend the Pap test and pelvic exam, combined with testing for human papilloma virus (HPV), every five years. Some types of HPV increase your risk of cervical cancer. Testing for HPV may also be done on women of any age who have unclear Pap test results.  Other health care providers may not recommend any screening for nonpregnant women who are considered low risk for pelvic cancer and have no symptoms. Ask your health care provider if a screening pelvic exam is right for you.  If you have had past treatment for cervical cancer or a condition that could lead to cancer, you need Pap tests and screening for cancer for at least 20 years after your treatment. If Pap tests have been discontinued for you, your risk factors (such as having a  new sexual partner) need to be reassessed to determine if you should start having screenings again. Some women have medical problems that increase the chance of getting cervical cancer. In these cases, your health care provider may recommend that you have screening and Pap tests more often.  If you have a family history of uterine cancer or ovarian cancer, talk with your health care provider about genetic screening.  If you have vaginal bleeding after reaching menopause, tell your health care provider.  There are currently no reliable tests available to screen for ovarian cancer.  Lung Cancer Lung cancer screening is recommended for adults 14-68 years old who are at high risk for lung cancer because of a history of smoking. A  yearly low-dose CT scan of the lungs is recommended if you:  Currently smoke.  Have a history of at least 30 pack-years of smoking and you currently smoke or have quit within the past 15 years. A pack-year is smoking an average of one pack of cigarettes per day for one year.  Yearly screening should:  Continue until it has been 15 years since you quit.  Stop if you develop a health problem that would prevent you from having lung cancer treatment.  Colorectal Cancer  This type of cancer can be detected and can often be prevented.  Routine colorectal cancer screening usually begins at age 59 and continues through age 58.  If you have risk factors for colon cancer, your health care provider may recommend that you be screened at an earlier age.  If you have a family history of colorectal cancer, talk with your health care provider about genetic screening.  Your health care provider may also recommend using home test kits to check for hidden blood in your stool.  A small camera at the end of a tube can be used to examine your colon directly (sigmoidoscopy or colonoscopy). This is done to check for the earliest forms of colorectal cancer.  Direct examination of the colon should be repeated every 5-10 years until age 39. However, if early forms of precancerous polyps or small growths are found or if you have a family history or genetic risk for colorectal cancer, you may need to be screened more often.  Skin Cancer  Check your skin from head to toe regularly.  Monitor any moles. Be sure to tell your health care provider: ? About any new moles or changes in moles, especially if there is a change in a mole's shape or color. ? If you have a mole that is larger than the size of a pencil eraser.  If any of your family members has a history of skin cancer, especially at a young age, talk with your health care provider about genetic screening.  Always use sunscreen. Apply sunscreen liberally  and repeatedly throughout the day.  Whenever you are outside, protect yourself by wearing long sleeves, pants, a wide-brimmed hat, and sunglasses.  What should I know about osteoporosis? Osteoporosis is a condition in which bone destruction happens more quickly than new bone creation. After menopause, you may be at an increased risk for osteoporosis. To help prevent osteoporosis or the bone fractures that can happen because of osteoporosis, the following is recommended:  If you are 79-25 years old, get at least 1,000 mg of calcium and at least 600 mg of vitamin D per day.  If you are older than age 87 but younger than age 56, get at least 1,200 mg of calcium and at least  600 mg of vitamin D per day.  If you are older than age 64, get at least 1,200 mg of calcium and at least 800 mg of vitamin D per day.  Smoking and excessive alcohol intake increase the risk of osteoporosis. Eat foods that are rich in calcium and vitamin D, and do weight-bearing exercises several times each week as directed by your health care provider. What should I know about how menopause affects my mental health? Depression may occur at any age, but it is more common as you become older. Common symptoms of depression include:  Low or sad mood.  Changes in sleep patterns.  Changes in appetite or eating patterns.  Feeling an overall lack of motivation or enjoyment of activities that you previously enjoyed.  Frequent crying spells.  Talk with your health care provider if you think that you are experiencing depression. What should I know about immunizations? It is important that you get and maintain your immunizations. These include:  Tetanus, diphtheria, and pertussis (Tdap) booster vaccine.  Influenza every year before the flu season begins.  Pneumonia vaccine.  Shingles vaccine.  Your health care provider may also recommend other immunizations. This information is not intended to replace advice given to you  by your health care provider. Make sure you discuss any questions you have with your health care provider. Document Released: 07/04/2005 Document Revised: 11/30/2015 Document Reviewed: 02/13/2015 Elsevier Interactive Patient Education  2018 [employer].

## 2016-12-15 ENCOUNTER — Other Ambulatory Visit: Payer: Self-pay | Admitting: Family

## 2016-12-15 ENCOUNTER — Other Ambulatory Visit: Payer: Self-pay | Admitting: Emergency Medicine

## 2016-12-15 DIAGNOSIS — R928 Other abnormal and inconclusive findings on diagnostic imaging of breast: Secondary | ICD-10-CM

## 2016-12-16 ENCOUNTER — Telehealth: Payer: Self-pay | Admitting: *Deleted

## 2016-12-16 NOTE — Telephone Encounter (Signed)
Received Physician Orders from [facility], forwarded to covering provider [[place]]/SLS 07/24

## 2016-12-22 ENCOUNTER — Other Ambulatory Visit: Payer: Self-pay | Admitting: Medical

## 2016-12-22 DIAGNOSIS — E2839 Other primary ovarian failure: Secondary | ICD-10-CM

## 2016-12-24 ENCOUNTER — Other Ambulatory Visit: Payer: Medicare Other

## 2016-12-24 ENCOUNTER — Ambulatory Visit
Admission: RE | Admit: 2016-12-24 | Discharge: 2016-12-24 | Disposition: A | Discharge status: Home / Self Care | Payer: Medicare Other | Source: Ambulatory Visit | Attending: Family | Admitting: Family

## 2016-12-24 ENCOUNTER — Other Ambulatory Visit: Payer: Self-pay | Admitting: Family

## 2016-12-24 DIAGNOSIS — R928 Other abnormal and inconclusive findings on diagnostic imaging of breast: Secondary | ICD-10-CM | POA: Diagnosis not present

## 2016-12-24 DIAGNOSIS — N6312 Unspecified lump in the right breast, upper inner quadrant: Secondary | ICD-10-CM | POA: Diagnosis not present

## 2016-12-24 DIAGNOSIS — N6310 Unspecified lump in the right breast, unspecified quadrant: Secondary | ICD-10-CM

## 2016-12-29 ENCOUNTER — Ambulatory Visit
Admission: RE | Admit: 2016-12-29 | Discharge: 2016-12-29 | Disposition: A | Discharge status: Home / Self Care | Payer: Medicare Other | Source: Ambulatory Visit | Attending: Family | Admitting: Family

## 2016-12-29 ENCOUNTER — Telehealth: Payer: Self-pay

## 2016-12-29 ENCOUNTER — Other Ambulatory Visit: Payer: Self-pay | Admitting: Family

## 2016-12-29 DIAGNOSIS — N6310 Unspecified lump in the right breast, unspecified quadrant: Secondary | ICD-10-CM

## 2016-12-29 DIAGNOSIS — N6312 Unspecified lump in the right breast, upper inner quadrant: Secondary | ICD-10-CM | POA: Diagnosis not present

## 2016-12-29 DIAGNOSIS — R928 Other abnormal and inconclusive findings on diagnostic imaging of breast: Secondary | ICD-10-CM

## 2016-12-29 DIAGNOSIS — N6011 Diffuse cystic mastopathy of right breast: Secondary | ICD-10-CM | POA: Diagnosis not present

## 2016-12-29 NOTE — Telephone Encounter (Signed)
Daughter wants to know if patient needs to still be getting B12 injections if her level was with in range on last B12 test. Advised I would check with provider and let her know.

## 2016-12-29 NOTE — Telephone Encounter (Signed)
Called daughter. States patient had mammogram today and she would rather wait to see what the results are and if she will need surgery befor she schedules B12 injection.

## 2016-12-29 NOTE — Telephone Encounter (Signed)
Yes please. I see she had an injection in June which is partly why her level looked good.

## 2016-12-29 NOTE — Telephone Encounter (Signed)
Left message regading patient coming in for B-1 injection or if she is still receiving them. Second message left for return call.

## 2016-12-29 NOTE — Telephone Encounter (Signed)
Noted  

## 2017-01-12 ENCOUNTER — Other Ambulatory Visit: Payer: Self-pay | Admitting: Family

## 2017-01-13 ENCOUNTER — Ambulatory Visit (INDEPENDENT_AMBULATORY_CARE_PROVIDER_SITE_OTHER): Payer: Medicare Other

## 2017-01-13 DIAGNOSIS — E538 Deficiency of other specified B group vitamins: Secondary | ICD-10-CM | POA: Diagnosis not present

## 2017-01-13 MED ORDER — CYANOCOBALAMIN 1000 MCG/ML IJ SOLN
1000.0000 ug | Freq: Once | INTRAMUSCULAR | Status: AC
  Administered 2017-01-13: 1000 ug via INTRAMUSCULAR

## 2017-01-13 NOTE — Progress Notes (Signed)
Reviewed

## 2017-01-13 NOTE — Progress Notes (Signed)
Pre visit review using our clinic tool,if applicable. No additional management support is needed unless otherwise documented below in the visit note.   Patient in for B12 injection per order from M. Osullivan NP due to B12 deficiency.  Given  1000 mcg IM left deltoid. Patient tolerated well. No complaints voiced today. States she got off track with B12 injections would like to stay on track now.  Return appointment given for 1 month.

## 2017-01-27 ENCOUNTER — Telehealth: Payer: Self-pay

## 2017-01-27 NOTE — Telephone Encounter (Signed)
PA attempted for Stiolto Respimat on CMM. Awaiting response. PA paper placed in the initiated folder. I will send to Dr. Agustina Caroli nurse to follow up.  Abigail Castillo (Key: [id number])   Your information has been submitted to [place] Medicare Part D. [employer] will review the request and will issue a decision, typically within 1-3 days from your submission. You can check the updated outcome later by reopening this request. If [employer] has not responded in 1-3 days or if you have any questions about your ePA request, please contact [place] Medicare Part D at [phone]. If you think there may be a problem with your PA request, use our live chat feature at the bottom right.

## 2017-01-29 NOTE — Telephone Encounter (Signed)
Checked Cover My Meds. PA has been approved. 

## 2017-01-29 NOTE — Telephone Encounter (Signed)
Called and spoke with pharmacy. They are aware that the PA has been approved. Key is [id number]

## 2017-02-09 ENCOUNTER — Telehealth: Payer: Self-pay | Admitting: *Deleted

## 2017-02-09 NOTE — Telephone Encounter (Signed)
Received Physician Orders from [facility] for [facility]N for Oxygen; forwarded to provider/SLS

## 2017-02-11 ENCOUNTER — Ambulatory Visit (INDEPENDENT_AMBULATORY_CARE_PROVIDER_SITE_OTHER): Payer: Medicare Other | Admitting: Behavioral Health

## 2017-02-11 DIAGNOSIS — E538 Deficiency of other specified B group vitamins: Secondary | ICD-10-CM

## 2017-02-11 MED ORDER — CYANOCOBALAMIN 1000 MCG/ML IJ SOLN
1000.0000 ug | Freq: Once | INTRAMUSCULAR | Status: AC
  Administered 2017-02-11: 1000 ug via INTRAMUSCULAR

## 2017-02-11 NOTE — Progress Notes (Signed)
Noted and agree. 

## 2017-02-11 NOTE — Progress Notes (Signed)
Pre visit review using our clinic review tool, if applicable. No additional management support is needed unless otherwise documented below in the visit note.  Patient presents in clinic for monthly B12 injection. IM injection was given in the right deltoid. Patient tolerated it well. Next appointment 03/18/17 at 9:00 AM.

## 2017-02-12 ENCOUNTER — Other Ambulatory Visit: Payer: Self-pay | Admitting: Family

## 2017-02-13 ENCOUNTER — Ambulatory Visit: Payer: Medicare Other

## 2017-02-13 NOTE — Telephone Encounter (Signed)
Patient needs appt first/thx dmf

## 2017-02-27 ENCOUNTER — Encounter: Payer: Self-pay | Admitting: Emergency Medicine

## 2017-02-27 ENCOUNTER — Ambulatory Visit (INDEPENDENT_AMBULATORY_CARE_PROVIDER_SITE_OTHER): Payer: Medicare Other | Admitting: Emergency Medicine

## 2017-02-27 DIAGNOSIS — J449 Chronic obstructive pulmonary disease, unspecified: Secondary | ICD-10-CM | POA: Diagnosis not present

## 2017-02-27 MED ORDER — AZITHROMYCIN 250 MG PO TABS: ORAL_TABLET | ORAL | 0 refills | Status: AC

## 2017-02-27 MED ORDER — PREDNISONE 10 MG PO TABS: ORAL_TABLET | ORAL | 0 refills | Status: DC

## 2017-02-27 NOTE — Assessment & Plan Note (Signed)
Take prednisone as directed until completely gone Please take azithromycin as directed until completely gone Please continue Stiolto as you are taking it Use albuterol 2 puffs as needed for shortness of breath, wheezing, spells of coughing. Continue your oxygen as you have been using it We talked about advanced directives and literature was given today. We will revisit this and discuss your medical condition and your wishes at future visits Get your flu shot this fall Follow with Dr Lamonte Sakai next available to insure that your are improving

## 2017-02-27 NOTE — Progress Notes (Signed)
Subjective:    Patient ID: Abigail Castillo, female    DOB: April 16, 1951, 66 y.o.   MRN: [id number]  HPI 66 year old former smoker with a documented history of COPD made years ago, believes that she had spirometry a long time ago, cant recall where. Also with a history of osteoporosis and depression, B12 deficiency. She is followed by [facility] primary care. She was seen in ED 3/17 for an AE-COPD, was treated with pred. She states that she exacerbates frequently. She is on Symbicort, has used it for years. Uses SABA at least once a day. She typically has daily cough, has been better lately. Has exertional SOB, is limited by her breathing.   [name] 01/14/16 -- Follow-up visit for former tobacco use and COPD. I saw her in April 2017 and she was on Symbicort. We have performed pulmonary function testing today and I have reviewed the results. She has very severe obstruction without a bronchodilator response. She was unable to do the diffusion capacity or the lung volumes.  She is only using Symbicort prn, albuterol prn. She has minimal cough, prod of phlegm. Occasional wheeze. No flares since last time.   [name] 03/24/16 -- This is a follow-up visit for COPD with very severe obstruction. At her last visit we tried to start Stiolto to see if she would benefit. Unfortunately she was not able to obtain this due to poor insurance coverage. She was treated for an acute flare by PCP 2 weeks ago. Her only BD right now is Symbicort prn. She has albuterol nebs available but does not use / need right now. Her O2 is at 2L/min at all times.   [name] 06/20/16 -- Patient has a history of severe obstructive lung disease, COPD. She was able to get Stiolto, has been using it reliable. Having less exertional dyspnea. Still does still have occasional wheeze, cough. She doesn't have an albuterol right now.   [name] 09/30/16 -- This is a follow-up visit for severe COPD. She has associated exertional hypoxemia. She reports that her breathing has  been stable - she remains limited with walking, especially without her O2 on. Uses 2L/min. She averages about 1x a day SABA. No flares since last time. She no longer uses DuoNeb. She uses mucinex prn. Minimal cough, does have some wheeze in the am's.   [name] 02/27/17 -- patient has a history of severe COPD with associated exertional hypoxemia. She returns today for regular follow up. She reports additional weakness and dyspnea over the last 2 weeks, more cough with white phlegm. More wheeze over this time period. She has been around old carpet, new allergic exposures. She is on stiolto, likes this med. She is using albuterol about once a day.  CAT score 28 this am  Review of Systems  Constitutional: Negative for fever and unexpected weight change.  HENT: Negative for congestion, dental problem, ear pain, nosebleeds, postnasal drip, rhinorrhea, sinus pressure, sneezing, sore throat and trouble swallowing.   Eyes: Negative for redness and itching.  Respiratory: Positive for shortness of breath. Negative for cough, chest tightness and wheezing.   Cardiovascular: Negative for palpitations and leg swelling.  Gastrointestinal: Negative for nausea and vomiting.  Genitourinary: Negative for dysuria.  Musculoskeletal: Negative for joint swelling.  Skin: Negative for rash.  Neurological: Negative for headaches.  Hematological: Does not bruise/bleed easily.  Psychiatric/Behavioral: Negative for dysphoric mood. The patient is not nervous/anxious.         Objective:   Physical Exam  Vitals:   02/27/17  3151 02/27/17 0905  BP:  120/88  Pulse:  99  SpO2:  97%  Weight: 133 lb (60.3 kg)   Height: 5\' 2"  (1.575 m)    Gen: Pleasant, well-nourished, in no distress  ENT: No lesions,  Edentulous, mouth clear,  oropharynx clear, no postnasal drip  Neck: No JVD, no stridor  Lungs: No use of accessory muscles,  Harsh bilateral expiratory wheezes  Cardiovascular: RRR, heart sounds normal, no murmur or  gallops, no peripheral edema  Musculoskeletal: No deformities, no cyanosis or clubbing  Neuro: alert, non focal, oriented   Skin: Warm, no lesions or rashes       Assessment & Plan:  COPD with acute exacerbation (HCC) Take prednisone as directed until completely gone Please take azithromycin as directed until completely gone Please continue Stiolto as you are taking it Use albuterol 2 puffs as needed for shortness of breath, wheezing, spells of coughing. Continue your oxygen as you have been using it We talked about advanced directives and literature was given today. We will revisit this and discuss your medical condition and your wishes at future visits Get your flu shot this fall Follow with Dr Lamonte Sakai next available to insure that your are improving  Baltazar Apo, MD, PhD 02/27/2017, 9:27 AM  Pulmonary and Critical Care [phone] or if no answer [phone]

## 2017-02-27 NOTE — Patient Instructions (Addendum)
Take prednisone as directed until completely gone Please take azithromycin as directed until completely gone Please continue Stiolto as you are taking it Use albuterol 2 puffs as needed for shortness of breath, wheezing, spells of coughing. Continue your oxygen as you have been using it We talked about advanced directives and literature was given today. We will revisit this and discuss your medical condition and your wishes at future visits Get your flu shot this fall Follow with Dr Lamonte Sakai next available to insure that your are improving

## 2017-03-11 DIAGNOSIS — F251 Schizoaffective disorder, depressive type: Secondary | ICD-10-CM | POA: Diagnosis not present

## 2017-03-13 ENCOUNTER — Ambulatory Visit (INDEPENDENT_AMBULATORY_CARE_PROVIDER_SITE_OTHER): Payer: Medicare Other | Admitting: Emergency Medicine

## 2017-03-13 ENCOUNTER — Encounter: Payer: Self-pay | Admitting: Emergency Medicine

## 2017-03-13 DIAGNOSIS — J449 Chronic obstructive pulmonary disease, unspecified: Secondary | ICD-10-CM | POA: Diagnosis not present

## 2017-03-13 DIAGNOSIS — Z23 Encounter for immunization: Secondary | ICD-10-CM

## 2017-03-13 MED ORDER — AEROCHAMBER MV MISC: 0 refills | Status: DC

## 2017-03-13 NOTE — Progress Notes (Signed)
Subjective:    Patient ID: Abigail Castillo, female    DOB: [birth year], 66 y.o.   MRN: [id number]  HPI 66 year old former smoker with a documented history of COPD made years ago, believes that she had spirometry a long time ago, cant recall where. Also with a history of osteoporosis and depression, B12 deficiency. She is followed by [facility] primary care. She was seen in ED 3/17 for an AE-COPD, was treated with pred. She states that she exacerbates frequently. She is on Symbicort, has used it for years. Uses SABA at least once a day. She typically has daily cough, has been better lately. Has exertional SOB, is limited by her breathing.   [name] 01/14/16 -- Follow-up visit for former tobacco use and COPD. I saw her in April 2017 and she was on Symbicort. We have performed pulmonary function testing today and I have reviewed the results. She has very severe obstruction without a bronchodilator response. She was unable to do the diffusion capacity or the lung volumes.  She is only using Symbicort prn, albuterol prn. She has minimal cough, prod of phlegm. Occasional wheeze. No flares since last time.   [name] 03/24/16 -- This is a follow-up visit for COPD with very severe obstruction. At her last visit we tried to start Stiolto to see if she would benefit. Unfortunately she was not able to obtain this due to poor insurance coverage. She was treated for an acute flare by PCP 2 weeks ago. Her only BD right now is Symbicort prn. She has albuterol nebs available but does not use / need right now. Her O2 is at 2L/min at all times.   [name] 06/20/16 -- Patient has a history of severe obstructive lung disease, COPD. She was able to get Stiolto, has been using it reliable. Having less exertional dyspnea. Still does still have occasional wheeze, cough. She doesn't have an albuterol right now.   [name] 09/30/16 -- This is a follow-up visit for severe COPD. She has associated exertional hypoxemia. She reports that her breathing has  been stable - she remains limited with walking, especially without her O2 on. Uses 2L/min. She averages about 1x a day SABA. No flares since last time. She no longer uses DuoNeb. She uses mucinex prn. Minimal cough, does have some wheeze in the am's.   [name] 02/27/17 -- patient has a history of severe COPD with associated exertional hypoxemia. She returns today for regular follow up. She reports additional weakness and dyspnea over the last 2 weeks, more cough with white phlegm. More wheeze over this time period. She has been around old carpet, new allergic exposures. She is on stiolto, likes this med. She is using albuterol about once a day.  CAT score 28 this am  [name] 03/13/17 -- follow-up visit for patient with history of severe COPD, exertional hypoxemia. I treated her for an acute flare 2 weeks ago. She returns today to assess her progress. She is much better. She does still have some cough, has exertional wheeze. Happens with minimal exertion. She wants a POC, need to see if she can tolerate pulsed flow. ? Whether she would benefit from a spacer.   Review of Systems  Constitutional: Negative for fever and unexpected weight change.  HENT: Negative for congestion, dental problem, ear pain, nosebleeds, postnasal drip, rhinorrhea, sinus pressure, sneezing, sore throat and trouble swallowing.   Eyes: Negative for redness and itching.  Respiratory: Positive for shortness of breath. Negative for cough, chest tightness and wheezing.  Cardiovascular: Negative for palpitations and leg swelling.  Gastrointestinal: Negative for nausea and vomiting.  Genitourinary: Negative for dysuria.  Musculoskeletal: Negative for joint swelling.  Skin: Negative for rash.  Neurological: Negative for headaches.  Hematological: Does not bruise/bleed easily.  Psychiatric/Behavioral: Negative for dysphoric mood. The patient is not nervous/anxious.         Objective:   Physical Exam  Vitals:   03/13/17 1217 03/13/17  1218  BP:  108/84  Pulse:  100  SpO2:  96%  Weight: 139 lb (63 kg)   Height: 5\' 1"  (1.549 m)    Gen: Pleasant, well-nourished, in no distress  ENT: No lesions,  Edentulous, mouth clear,  oropharynx clear, no postnasal drip  Neck: No JVD, no stridor  Lungs: No use of accessory muscles, distant but much improved.   Cardiovascular: RRR, heart sounds normal, no murmur or gallops, no peripheral edema  Musculoskeletal: No deformities, no cyanosis or clubbing  Neuro: alert, non focal, oriented   Skin: Warm, no lesions or rashes       Assessment & Plan:  COPD (chronic obstructive pulmonary disease) ([place]) With recent acute exacerbation. She is clinically improved. I question whether she may benefit from adding a spacer to her albuterol. She she also wonders if she would benefit from a portal oxygen concentrator. I will try to get this for her if she can tolerate pulsed oxygen.   Please continue Stiolto 2 puffs once a day Use your albuterol 2 puffs as needed for shortness of breath, wheezing. We will give you a spacer to help with better delivery of the medication into your chest We will work on getting you a portable oxygen concentrator. We may need to change your DME company in order to get you the kind that you need. We will need to establish that you can tolerate pulsed flow oxygen in order to get the device. Flu shot today. Follow with Dr Lamonte Sakai in 3 months or sooner if you have any problems.  Baltazar Apo, MD, PhD 03/13/2017, 12:38 PM [place] [facility] [phone] or if no answer [phone]

## 2017-03-13 NOTE — Assessment & Plan Note (Signed)
With recent acute exacerbation. She is clinically improved. I question whether she may benefit from adding a spacer to her albuterol. She she also wonders if she would benefit from a portal oxygen concentrator. I will try to get this for her if she can tolerate pulsed oxygen.   Please continue Stiolto 2 puffs once a day Use your albuterol 2 puffs as needed for shortness of breath, wheezing. We will give you a spacer to help with better delivery of the medication into your chest We will work on getting you a portable oxygen concentrator. We may need to change your DME company in order to get you the kind that you need. We will need to establish that you can tolerate pulsed flow oxygen in order to get the device. Flu shot today. Follow with Dr Lamonte Sakai in 3 months or sooner if you have any problems.

## 2017-03-13 NOTE — Patient Instructions (Signed)
Please continue Stiolto 2 puffs once a day Use your albuterol 2 puffs as needed for shortness of breath, wheezing. We will give you a spacer to help with better delivery of the medication into your chest We will work on getting you a portable oxygen concentrator. We may need to change your DME company in order to get you the kind that you need. We will need to establish that you can tolerate pulsed flow oxygen in order to get the device. Flu shot today. Follow with Dr Lamonte Sakai in 3 months or sooner if you have any problems.

## 2017-03-13 NOTE — Addendum Note (Signed)
Addended by: Collier Salina on: 03/13/2017 12:49 PM   Modules accepted: Orders

## 2017-03-18 ENCOUNTER — Ambulatory Visit (INDEPENDENT_AMBULATORY_CARE_PROVIDER_SITE_OTHER): Payer: Medicare Other

## 2017-03-18 ENCOUNTER — Ambulatory Visit (HOSPITAL_BASED_OUTPATIENT_CLINIC_OR_DEPARTMENT_OTHER)
Admission: RE | Admit: 2017-03-18 | Discharge: 2017-03-18 | Disposition: A | Discharge status: Home / Self Care | Payer: Medicare Other | Source: Ambulatory Visit | Attending: Medical | Admitting: Medical

## 2017-03-18 DIAGNOSIS — M810 Age-related osteoporosis without current pathological fracture: Secondary | ICD-10-CM | POA: Diagnosis not present

## 2017-03-18 DIAGNOSIS — E538 Deficiency of other specified B group vitamins: Secondary | ICD-10-CM

## 2017-03-18 DIAGNOSIS — E2839 Other primary ovarian failure: Secondary | ICD-10-CM | POA: Diagnosis not present

## 2017-03-18 DIAGNOSIS — Z780 Asymptomatic menopausal state: Secondary | ICD-10-CM | POA: Diagnosis not present

## 2017-03-18 MED ORDER — CYANOCOBALAMIN 1000 MCG/ML IJ SOLN
1000.0000 ug | Freq: Once | INTRAMUSCULAR | Status: AC
  Administered 2017-03-18: 1000 ug via INTRAMUSCULAR

## 2017-03-18 NOTE — Progress Notes (Signed)
Noted and agree. 

## 2017-03-18 NOTE — Progress Notes (Signed)
Pre visit review using our clinic tool,if applicable. No additional management support is needed unless otherwise documented below in the visit note.   Patient in for B12 injection per order from Dr. Elby Beck due to patient having B12 deficiency.  Patient has no complaints this visit.  Given 1000 mcg IM Right deltoid. Patient tolerated well.  Return appointment given for 1 month.

## 2017-03-19 ENCOUNTER — Telehealth: Payer: Self-pay | Admitting: Medical

## 2017-03-19 MED ORDER — ALENDRONATE SODIUM 10 MG PO TABS: 10.0000 mg | ORAL_TABLET | Freq: Every day | ORAL | 11 refills | Status: DC

## 2017-03-19 NOTE — Telephone Encounter (Signed)
Rx fosamax sent to pharmacy.

## 2017-03-26 ENCOUNTER — Telehealth: Payer: Self-pay | Admitting: Family

## 2017-03-26 NOTE — Telephone Encounter (Signed)
Patient daughter Abigail Castillo called and wanted to be informed on why her mother was given RX -- Salt Pill. Adv patient mother a message would be pass back to the Nurse. Please call [phone].

## 2017-03-27 NOTE — Telephone Encounter (Signed)
Notified pt's daughter per DEXA result and she voices understanding. See result note.

## 2017-04-02 ENCOUNTER — Other Ambulatory Visit: Payer: Self-pay | Admitting: Family

## 2017-04-15 ENCOUNTER — Ambulatory Visit (INDEPENDENT_AMBULATORY_CARE_PROVIDER_SITE_OTHER): Payer: Medicare Other

## 2017-04-15 DIAGNOSIS — E538 Deficiency of other specified B group vitamins: Secondary | ICD-10-CM | POA: Diagnosis not present

## 2017-04-15 MED ORDER — CYANOCOBALAMIN 1000 MCG/ML IJ SOLN
1000.0000 ug | Freq: Once | INTRAMUSCULAR | Status: AC
Start: 2017-04-15 — End: 2017-04-15
  Administered 2017-04-15: 1000 ug via INTRAMUSCULAR

## 2017-04-15 NOTE — Progress Notes (Signed)
Pre visit review using our clinic tool,if applicable. No additional management support is needed unless otherwise documented below in the visit note.   Patient in for B12 injection per M.Osullivan, NP. Patient has B12 deficiency.  No complaints voiced this visit. Patient has added a new medication to her regimine, Fosamax which she states she is tolerating well at this time.  Given 1000 mcg IM left deltoid. Patient tolerated well. Appointment scheduled for 1 month.

## 2017-04-15 NOTE — Addendum Note (Signed)
Addended by: Bunnie Domino on: 04/15/2017 09:40 AM   Modules accepted: Level of Service

## 2017-04-15 NOTE — Progress Notes (Signed)
Noted  

## 2017-05-15 ENCOUNTER — Ambulatory Visit (INDEPENDENT_AMBULATORY_CARE_PROVIDER_SITE_OTHER): Payer: Medicare Other

## 2017-05-15 DIAGNOSIS — E538 Deficiency of other specified B group vitamins: Secondary | ICD-10-CM | POA: Diagnosis not present

## 2017-05-15 MED ORDER — LOPERAMIDE HCL 2 MG PO CAPS: ORAL_CAPSULE | ORAL | 2 refills | Status: DC

## 2017-05-15 MED ORDER — CYANOCOBALAMIN 1000 MCG/ML IJ SOLN
1000.0000 ug | Freq: Once | INTRAMUSCULAR | Status: AC
  Administered 2017-05-15: 1000 ug via INTRAMUSCULAR

## 2017-05-15 NOTE — Progress Notes (Signed)
Reviewed. ° ° °Reynol Arnone S [name] NP °

## 2017-05-15 NOTE — Progress Notes (Signed)
Pre visit review using our clinic tool,if applicable. No additional management support is needed unless otherwise documented below in the visit note.   Patient in today for B12 injection per order from M. [name],NP. Patient has B12 deficiency.  Given 1000 mcg IM Left deltoid. Patient tolerated well.  Return appointment given for 1 month.

## 2017-05-29 ENCOUNTER — Other Ambulatory Visit: Payer: Self-pay | Admitting: Family Medicine

## 2017-06-06 ENCOUNTER — Other Ambulatory Visit: Payer: Self-pay | Admitting: Emergency Medicine

## 2017-06-16 ENCOUNTER — Ambulatory Visit (INDEPENDENT_AMBULATORY_CARE_PROVIDER_SITE_OTHER): Payer: Medicare Other | Admitting: Emergency Medicine

## 2017-06-16 ENCOUNTER — Encounter: Payer: Self-pay | Admitting: Emergency Medicine

## 2017-06-16 DIAGNOSIS — J301 Allergic rhinitis due to pollen: Secondary | ICD-10-CM

## 2017-06-16 DIAGNOSIS — J449 Chronic obstructive pulmonary disease, unspecified: Secondary | ICD-10-CM

## 2017-06-16 DIAGNOSIS — J309 Allergic rhinitis, unspecified: Secondary | ICD-10-CM | POA: Insufficient documentation

## 2017-06-16 NOTE — Assessment & Plan Note (Signed)
These continue Stiolto 2 puffs once a day Please continue oxygen at 2 L/min.  We will check with [facility] to see if they have a suitable portable oxygen concentrator that you can use Keep albuterol to use 2 puffs if needed for shortness of breath. Please check with your primary physician to see if you have received the Prevnar 13 vaccination.  If not you would probably benefit from this at some point in the future. Follow with Dr Lamonte Sakai in 4 months or sooner if you have any problems.

## 2017-06-16 NOTE — Patient Instructions (Signed)
These continue Stiolto 2 puffs once a day Please continue oxygen at 2 L/min.  We will check with [facility] to see if they have a suitable portable oxygen concentrator that you can use Keep albuterol to use 2 puffs if needed for shortness of breath. Please check with your primary physician to see if you have received the Prevnar 13 vaccination.  If not you would probably benefit from this at some point in the future. Follow with Dr Lamonte Sakai in 4 months or sooner if you have any problems.

## 2017-06-16 NOTE — Progress Notes (Signed)
Subjective:    Patient ID: Abigail Castillo, female    DOB: [birth year], 67 y.o.   MRN: [id number]  COPD  She complains of shortness of breath. There is no cough or wheezing. Pertinent negatives include no ear pain, fever, headaches, postnasal drip, rhinorrhea, sneezing, sore throat or trouble swallowing. Her past medical history is significant for COPD.   67 year old former smoker with a documented history of COPD made years ago, believes that she had spirometry a long time ago, cant recall where. Also with a history of osteoporosis and depression, B12 deficiency. She is followed by [facility] primary care. She was seen in ED 3/17 for an AE-COPD, was treated with pred. She states that she exacerbates frequently. She is on Symbicort, has used it for years. Uses SABA at least once a day. She typically has daily cough, has been better lately. Has exertional SOB, is limited by her breathing.   [name] 01/14/16 -- Follow-up visit for former tobacco use and COPD. I saw her in April 2017 and she was on Symbicort. We have performed pulmonary function testing today and I have reviewed the results. She has very severe obstruction without a bronchodilator response. She was unable to do the diffusion capacity or the lung volumes.  She is only using Symbicort prn, albuterol prn. She has minimal cough, prod of phlegm. Occasional wheeze. No flares since last time.   [name] 03/24/16 -- This is a follow-up visit for COPD with very severe obstruction. At her last visit we tried to start Stiolto to see if she would benefit. Unfortunately she was not able to obtain this due to poor insurance coverage. She was treated for an acute flare by PCP 2 weeks ago. Her only BD right now is Symbicort prn. She has albuterol nebs available but does not use / need right now. Her O2 is at 2L/min at all times.   [name] 06/20/16 -- Patient has a history of severe obstructive lung disease, COPD. She was able to get Stiolto, has been using it reliable.  Having less exertional dyspnea. Still does still have occasional wheeze, cough. She doesn't have an albuterol right now.   [name] 09/30/16 -- This is a follow-up visit for severe COPD. She has associated exertional hypoxemia. She reports that her breathing has been stable - she remains limited with walking, especially without her O2 on. Uses 2L/min. She averages about 1x a day SABA. No flares since last time. She no longer uses DuoNeb. She uses mucinex prn. Minimal cough, does have some wheeze in the am's.   [name] 02/27/17 -- patient has a history of severe COPD with associated exertional hypoxemia. She returns today for regular follow up. She reports additional weakness and dyspnea over the last 2 weeks, more cough with white phlegm. More wheeze over this time period. She has been around old carpet, new allergic exposures. She is on stiolto, likes this med. She is using albuterol about once a day.  CAT score 28 this am  [name] 03/13/17 -- follow-up visit for patient with history of severe COPD, exertional hypoxemia. I treated her for an acute flare 2 weeks ago. She returns today to assess her progress. She is much better. She does still have some cough, has exertional wheeze. Happens with minimal exertion. She wants a POC, need to see if she can tolerate pulsed flow. ? Whether she would benefit from a spacer.   [name] 06/16/17 --67 year old woman with a history of severe COPD, associated exertional hypoxemia. She has been  approved by [facility] for a POC. They were working on getting a new product that was superior, she was waiting for this.  She has not had any flares since last time.  Has some intermittent nasal gtt. She uses ventolin about 1x a day. Her O2 is at 2L/min. Tolerating Stiolto well.    Review of Systems  Constitutional: Negative for fever and unexpected weight change.  HENT: Negative for congestion, dental problem, ear pain, nosebleeds, postnasal drip, rhinorrhea, sinus pressure, sneezing, sore throat and  trouble swallowing.   Eyes: Negative for redness and itching.  Respiratory: Positive for shortness of breath. Negative for cough, chest tightness and wheezing.   Cardiovascular: Negative for palpitations and leg swelling.  Gastrointestinal: Negative for nausea and vomiting.  Genitourinary: Negative for dysuria.  Musculoskeletal: Negative for joint swelling.  Skin: Negative for rash.  Neurological: Negative for headaches.  Hematological: Does not bruise/bleed easily.  Psychiatric/Behavioral: Negative for dysphoric mood. The patient is not nervous/anxious.         Objective:   Physical Exam  Vitals:   06/16/17 0935  BP: 102/60  Pulse: (!) 106  SpO2: 95%  Weight: 137 lb (62.1 kg)  Height: 5\' 2"  (1.575 m)   Gen: Pleasant, well-nourished, in no distress  ENT: No lesions,  Edentulous, mouth clear,  oropharynx clear, no postnasal drip  Neck: No JVD, no stridor  Lungs: No use of accessory muscles, distant,   Cardiovascular: RRR, heart sounds normal, no murmur or gallops, no peripheral edema  Musculoskeletal: No deformities, no cyanosis or clubbing  Neuro: alert, non focal, oriented   Skin: Warm, no lesions or rashes       Assessment & Plan:  COPD (chronic obstructive pulmonary disease) (HCC) These continue Stiolto 2 puffs once a day Please continue oxygen at 2 L/min.  We will check with [facility] to see if they have a suitable portable oxygen concentrator that you can use Keep albuterol to use 2 puffs if needed for shortness of breath. Please check with your primary physician to see if you have received the Prevnar 13 vaccination.  If not you would probably benefit from this at some point in the future. Follow with Dr Lamonte Sakai in 4 months or sooner if you have any problems.  Allergic rhinitis Trial of fluticasone nasal spray as needed for nasal drainage  Baltazar Apo, MD, PhD 06/16/2017, 9:59 AM  Pulmonary and Critical Care [phone] or if no answer  [phone]

## 2017-06-16 NOTE — Assessment & Plan Note (Signed)
Trial of fluticasone nasal spray as needed for nasal drainage

## 2017-06-23 ENCOUNTER — Ambulatory Visit (INDEPENDENT_AMBULATORY_CARE_PROVIDER_SITE_OTHER): Payer: Medicare Other

## 2017-06-23 DIAGNOSIS — E538 Deficiency of other specified B group vitamins: Secondary | ICD-10-CM | POA: Diagnosis not present

## 2017-06-23 MED ORDER — CYANOCOBALAMIN 1000 MCG/ML IJ SOLN
1000.0000 ug | Freq: Once | INTRAMUSCULAR | Status: AC
  Administered 2017-06-23: 1000 ug via INTRAMUSCULAR

## 2017-06-23 NOTE — Progress Notes (Signed)
Pre visit review using our clinic tool,if applicable. No additional management support is needed unless otherwise documented below in the visit note.   Patient in for B12 injection per order from M. [name] due to patient having B12 deficiency.   Given 1000 mcg IM left deltoid. Patient tolerated well. No complaints this visit.  Return appointment scheduled for 4 weeks.

## 2017-06-24 NOTE — Progress Notes (Signed)
Reviewed and agree.

## 2017-07-21 ENCOUNTER — Ambulatory Visit (INDEPENDENT_AMBULATORY_CARE_PROVIDER_SITE_OTHER): Payer: Medicare Other

## 2017-07-21 DIAGNOSIS — E538 Deficiency of other specified B group vitamins: Secondary | ICD-10-CM

## 2017-07-21 MED ORDER — CYANOCOBALAMIN 1000 MCG/ML IJ SOLN
1000.0000 ug | Freq: Once | INTRAMUSCULAR | Status: AC
  Administered 2017-07-21: 1000 ug via INTRAMUSCULAR

## 2017-07-21 NOTE — Progress Notes (Signed)
Noted.   Abigail Castillo

## 2017-07-21 NOTE — Progress Notes (Signed)
Patient presents today for B 12 injection. Patient given in right deltoid at patient request. Patient tolerated injection well. Next injection scheduled for 08/18/17 at 9 am.

## 2017-08-18 ENCOUNTER — Ambulatory Visit (INDEPENDENT_AMBULATORY_CARE_PROVIDER_SITE_OTHER): Payer: Medicare Other | Admitting: *Deleted

## 2017-08-18 DIAGNOSIS — E538 Deficiency of other specified B group vitamins: Secondary | ICD-10-CM

## 2017-08-18 MED ORDER — CYANOCOBALAMIN 1000 MCG/ML IJ SOLN
1000.0000 ug | Freq: Once | INTRAMUSCULAR | Status: AC
  Administered 2017-08-18: 1000 ug via INTRAMUSCULAR

## 2017-08-18 NOTE — Progress Notes (Signed)
I would like to see her back for 1 year follow up please.

## 2017-08-18 NOTE — Progress Notes (Signed)
Pre visit review using our clinic review tool, if applicable. No additional management support is needed unless otherwise documented below in the visit note.  Pt here for B12 injection per [name], NP.  B12 1038mcg given IM, left deltoid and pt tolerated in.  Next B12 injection scheduled for 09/22/17 at 9:15am.

## 2017-08-19 NOTE — Progress Notes (Signed)
I called pt's daughter and left detailed message to schedule 1 yr follow up with Melissa on or after 11/10/17.

## 2017-08-25 ENCOUNTER — Encounter: Payer: Self-pay | Admitting: Emergency Medicine

## 2017-08-25 ENCOUNTER — Ambulatory Visit (INDEPENDENT_AMBULATORY_CARE_PROVIDER_SITE_OTHER): Payer: Medicare Other | Admitting: Emergency Medicine

## 2017-08-25 DIAGNOSIS — J449 Chronic obstructive pulmonary disease, unspecified: Secondary | ICD-10-CM | POA: Diagnosis not present

## 2017-08-25 DIAGNOSIS — J9611 Chronic respiratory failure with hypoxia: Secondary | ICD-10-CM | POA: Diagnosis not present

## 2017-08-25 DIAGNOSIS — J9610 Chronic respiratory failure, unspecified whether with hypoxia or hypercapnia: Secondary | ICD-10-CM | POA: Insufficient documentation

## 2017-08-25 NOTE — Progress Notes (Signed)
Subjective:    Patient ID: Abigail Castillo, female    DOB: May 21, 1951, 67 y.o.   MRN: [id number]  COPD  She complains of shortness of breath. There is no cough or wheezing. Pertinent negatives include no ear pain, fever, headaches, postnasal drip, rhinorrhea, sneezing, sore throat or trouble swallowing. Her past medical history is significant for COPD.   67 year old former smoker with a documented history of COPD.  Also with a history of osteoporosis and depression, B12 deficiency. Hx freq AE's   [name] 06/16/17 --67 year old woman with a history of severe COPD, associated exertional hypoxemia. She has been approved by [facility] for a POC. They were working on getting a new product that was superior, she was waiting for this.  She has not had any flares since last time.  Has some intermittent nasal gtt. She uses ventolin about 1x a day. Her O2 is at 2L/min. Tolerating Stiolto well.   [name] 08/25/17 --this is a follow-up visit for patient with a history of severe COPD with associated exertional hypoxemia.  She also has some intermittent nasal drainage.  She is wearing 2 L/min. Currently managed on Stiolto, tolerates well. She has had some increased dyspnea with household chores - was doing more than her usual work. No real cough. she has had some L flank and rib pain over the last few weeks - resolved. No flares since last time, no abx or pred.    Review of Systems  Constitutional: Negative for fever and unexpected weight change.  HENT: Negative for congestion, dental problem, ear pain, nosebleeds, postnasal drip, rhinorrhea, sinus pressure, sneezing, sore throat and trouble swallowing.   Eyes: Negative for redness and itching.  Respiratory: Positive for shortness of breath. Negative for cough, chest tightness and wheezing.   Cardiovascular: Negative for palpitations and leg swelling.  Gastrointestinal: Negative for nausea and vomiting.  Genitourinary: Negative for dysuria.  Musculoskeletal: Negative for  joint swelling.  Skin: Negative for rash.  Neurological: Negative for headaches.  Hematological: Does not bruise/bleed easily.  Psychiatric/Behavioral: Negative for dysphoric mood. The patient is not nervous/anxious.         Objective:   Physical Exam  Vitals:   08/25/17 1643  BP: 110/90  Pulse: 89  SpO2: 97%  Weight: 135 lb (61.2 kg)  Height: 5\' 2"  (1.575 m)   Gen: Pleasant, well-nourished, in no distress  ENT: No lesions,  Edentulous, mouth clear,  oropharynx clear, no postnasal drip  Neck: No JVD, no stridor  Lungs: No use of accessory muscles, distant,   Cardiovascular: RRR, heart sounds normal, no murmur or gallops, no peripheral edema  Musculoskeletal: No deformities, no cyanosis or clubbing  Neuro: alert, non focal, oriented   Skin: Warm, no lesions or rashes       Assessment & Plan:  COPD (chronic obstructive pulmonary disease) (HCC) Please continue your Stiolto 2 puffs once a day every day. Use your albuterol 2 puffs up to every 4 hours if needed for shortness of breath, wheezing, chest tightness.  You can also try using 2 puffs before any planned exertion to see if it makes your task any easier. We talked today about your pneumonia vaccines.  You will be due for the Pneumovax next year.  You also would probably benefit from the Prevnar 13 pneumonia shot.  We can talk about giving you this shot at your next visit.  As always  you need the flu shot annually. Follow with Dr Lamonte Sakai in 6 months or sooner if you have  any problems  Chronic respiratory failure (HCC) Continue oxygen at 2 L/min.  We will work on getting you a portable oxygen concentrator through [employer].  We have attempted this before and it is unclear to me where the barriers are.  We will speak with them and try to get it resolved.  Baltazar Apo, MD, PhD 08/25/2017, 5:01 PM [facility] [phone] or if no answer [phone]

## 2017-08-25 NOTE — Assessment & Plan Note (Signed)
Please continue your Stiolto 2 puffs once a day every day. Use your albuterol 2 puffs up to every 4 hours if needed for shortness of breath, wheezing, chest tightness.  You can also try using 2 puffs before any planned exertion to see if it makes your task any easier. We talked today about your pneumonia vaccines.  You will be due for the Pneumovax next year.  You also would probably benefit from the Prevnar 13 pneumonia shot.  We can talk about giving you this shot at your next visit.  As always  you need the flu shot annually. Follow with Dr Lamonte Sakai in 6 months or sooner if you have any problems

## 2017-08-25 NOTE — Patient Instructions (Signed)
Please continue your Stiolto 2 puffs once a day every day. Use your albuterol 2 puffs up to every 4 hours if needed for shortness of breath, wheezing, chest tightness.  You can also try using 2 puffs before any planned exertion to see if it makes your task any easier. Continue oxygen at 2 L/min.  We will work on getting you a portable oxygen concentrator through [employer].  We have attempted this before and it is unclear to me where the barriers are.  We will speak with them and try to get it resolved. We talked today about your pneumonia vaccines.  You will be due for the Pneumovax next year.  You also would probably benefit from the Prevnar 13 pneumonia shot.  We can talk about giving you this shot at your next visit.  As always  you need the flu shot annually. Follow with Dr Lamonte Sakai in 6 months or sooner if you have any problems

## 2017-08-25 NOTE — Assessment & Plan Note (Signed)
Continue oxygen at 2 L/min.  We will work on getting you a portable oxygen concentrator through [employer].  We have attempted this before and it is unclear to me where the barriers are.  We will speak with them and try to get it resolved.

## 2017-09-07 DIAGNOSIS — F251 Schizoaffective disorder, depressive type: Secondary | ICD-10-CM | POA: Diagnosis not present

## 2017-09-22 ENCOUNTER — Ambulatory Visit (INDEPENDENT_AMBULATORY_CARE_PROVIDER_SITE_OTHER): Payer: Medicare Other

## 2017-09-22 DIAGNOSIS — E538 Deficiency of other specified B group vitamins: Secondary | ICD-10-CM | POA: Diagnosis not present

## 2017-09-22 MED ORDER — CYANOCOBALAMIN 1000 MCG/ML IJ SOLN
1000.0000 ug | Freq: Once | INTRAMUSCULAR | Status: AC
  Administered 2017-09-22: 1000 ug via INTRAMUSCULAR

## 2017-09-22 NOTE — Progress Notes (Signed)
Patient came in today to have her monthly b-12 injection. She tolerated 65mL in her right deltoid with no complications

## 2017-10-12 ENCOUNTER — Other Ambulatory Visit: Payer: Self-pay | Admitting: Family

## 2017-10-22 ENCOUNTER — Ambulatory Visit (INDEPENDENT_AMBULATORY_CARE_PROVIDER_SITE_OTHER): Payer: Medicare Other

## 2017-10-22 DIAGNOSIS — E538 Deficiency of other specified B group vitamins: Secondary | ICD-10-CM | POA: Diagnosis not present

## 2017-10-22 MED ORDER — CYANOCOBALAMIN 1000 MCG/ML IJ SOLN
1000.0000 ug | Freq: Once | INTRAMUSCULAR | Status: AC
  Administered 2017-10-22: 1000 ug via INTRAMUSCULAR

## 2017-10-22 NOTE — Progress Notes (Signed)
Reviewed. ° ° °Demitris Pokorny S [name] NP °

## 2017-10-22 NOTE — Progress Notes (Signed)
Pre visit review using our clinic tool,if applicable. No additional management support is needed unless otherwise documented below in the visit note.   Pt here for monthly B12 injection per [name] NP  B12 1042mcg given IM, and pt tolerated injection well.  Next B12 injection scheduled for November 20, 2017 at 9:00 am

## 2017-11-09 ENCOUNTER — Other Ambulatory Visit: Payer: Self-pay | Admitting: Family

## 2017-11-11 ENCOUNTER — Ambulatory Visit (INDEPENDENT_AMBULATORY_CARE_PROVIDER_SITE_OTHER): Payer: Medicare Other | Admitting: Family

## 2017-11-11 ENCOUNTER — Encounter: Payer: Self-pay | Admitting: Family

## 2017-11-11 VITALS — BP 120/80 | HR 98 | Temp 98.2°F | Resp 20 | Ht 62.0 in | Wt 137.6 lb

## 2017-11-11 DIAGNOSIS — R197 Diarrhea, unspecified: Secondary | ICD-10-CM

## 2017-11-11 DIAGNOSIS — R3915 Urgency of urination: Secondary | ICD-10-CM | POA: Diagnosis not present

## 2017-11-11 DIAGNOSIS — J449 Chronic obstructive pulmonary disease, unspecified: Secondary | ICD-10-CM

## 2017-11-11 DIAGNOSIS — E538 Deficiency of other specified B group vitamins: Secondary | ICD-10-CM

## 2017-11-11 DIAGNOSIS — J309 Allergic rhinitis, unspecified: Secondary | ICD-10-CM

## 2017-11-11 DIAGNOSIS — F32A Depression, unspecified: Secondary | ICD-10-CM

## 2017-11-11 DIAGNOSIS — Z23 Encounter for immunization: Secondary | ICD-10-CM

## 2017-11-11 DIAGNOSIS — M810 Age-related osteoporosis without current pathological fracture: Secondary | ICD-10-CM

## 2017-11-11 DIAGNOSIS — F329 Major depressive disorder, single episode, unspecified: Secondary | ICD-10-CM

## 2017-11-11 LAB — URINALYSIS, ROUTINE W REFLEX MICROSCOPIC
BILIRUBIN URINE: NEGATIVE
Hgb urine dipstick: NEGATIVE
KETONES UR: NEGATIVE
NITRITE: NEGATIVE
PH: 5.5 (ref 5.0–8.0)
RBC / HPF: NONE SEEN (ref 0–?)
Specific Gravity, Urine: >=1.03 — AB (ref 1.000–1.030)
TOTAL PROTEIN, URINE-UPE24: NEGATIVE
URINE GLUCOSE: NEGATIVE
UROBILINOGEN UA: 0.2 (ref 0.0–1.0)

## 2017-11-11 NOTE — Progress Notes (Signed)
Subjective:    Patient ID: Abigail Castillo, female    DOB: 1951-02-17, 67 y.o.   MRN: [id number]  HPI  Pt is a 67 yr old female who presents today for follow up.  Depression- reports that this is stable on current meds. She is working with psychiatry.  Wt Readings from Last 3 Encounters:  11/11/17 137 lb 9.6 oz (62.4 kg)  08/25/17 135 lb (61.2 kg)  06/16/17 137 lb (62.1 kg)   COPD- reports cough/congestion/sob which has been present off/on x 1 month. Notes some clear nasal drainage x 1 month as well.   Osteoporosis- stopped fosamax because she and her daughter did not know what it was for.   B12 deficiency- continues mothly b12 injections.   Urinary urgency- has urinary urgency which sometimes leads to incontinence when she can't make it to the bathroom in time. Has intermittent diarrhea which can lead to fecal incontinence.    Review of Systems See HPI  Past Medical History:  Diagnosis Date  . Anxiety   . Blood transfusion   . Blood transfusion without reported diagnosis   . Chronic mental illness   . COPD (chronic obstructive pulmonary disease) ([place])   . DEPRESSION 05/31/2009  . Emphysema of lung ([place])   . Esophageal stricture 03/14/2015  . History of home oxygen therapy   . History of pneumonia   . Osteoporosis   . Shortness of breath dyspnea   . Tobacco abuse    ongoing  . Vitamin B 12 deficiency   . Vitamin D deficiency      Social History   Socioeconomic History  . Marital status: Divorced    Spouse name: Not on file  . Number of children: 3  . Years of education: Not on file  . Highest education level: Not on file  Occupational History  . Occupation: disabled    Fish farm manager: UNEMPLOYED  Social Needs  . Financial resource strain: Not on file  . Food insecurity:    Worry: Not on file    Inability: Not on file  . Transportation needs:    Medical: Not on file    Non-medical: Not on file  Tobacco Use  . Smoking status: Former Smoker    Types:  Cigarettes    Last attempt to quit: 08/12/2011    Years since quitting: 6.2  . Smokeless tobacco: Never Used  Substance and Sexual Activity  . Alcohol use: No    Alcohol/week: 0.0 oz  . Drug use: No  . Sexual activity: Never  Lifestyle  . Physical activity:    Days per week: Not on file    Minutes per session: Not on file  . Stress: Not on file  Relationships  . Social connections:    Talks on phone: Not on file    Gets together: Not on file    Attends religious service: Not on file    Active member of club or organization: Not on file    Attends meetings of clubs or organizations: Not on file    Relationship status: Not on file  . Intimate partner violence:    Fear of current or ex partner: Not on file    Emotionally abused: Not on file    Physically abused: Not on file    Forced sexual activity: Not on file  Other Topics Concern  . Not on file  Social History Narrative   Lost her son to Lung CA 1/18   Lives in a group home  Has a daughter who lives locally    Past Surgical History:  Procedure Laterality Date  . ABDOMINAL HYSTERECTOMY    . BREAST EXCISIONAL BIOPSY Left 2017  . BREAST LUMPECTOMY  1972   left breast  . BREAST LUMPECTOMY WITH RADIOACTIVE SEED LOCALIZATION Left 11/22/2015   Procedure: LEFT BREAST LUMPECTOMY WITH RADIOACTIVE SEED LOCALIZATION;  Surgeon: Erroll Luna, MD;  Location: [facility];  Service: General;  Laterality: Left;  . BREAST SURGERY  ?2015   lumpectomy, left  . CERVICAL CONE BIOPSY    . CHOLECYSTECTOMY    . COLONOSCOPY    . ESOPHAGOGASTRODUODENOSCOPY (EGD) WITH ESOPHAGEAL DILATION    . UPPER GASTROINTESTINAL ENDOSCOPY      Family History  Problem Relation Age of Onset  . Coronary artery disease Mother   . Diabetes Mother   . Hypertension Mother   . Coronary artery disease Father   . Diabetes Father        Grandfather  . Hypertension Father   . Stroke Father   . Other Father        colostomy bag  . Breast cancer Sister   .  Cancer Other        niece--breast  . Diabetes Sister   . Hypertension Sister   . Mental illness Sister        x 4  . Esophageal cancer Neg Hx   . Stomach cancer Neg Hx   . Rectal cancer Neg Hx   . Colon cancer Neg Hx   . Colon polyps Neg Hx     Allergies  Allergen Reactions  . Sulfonamide Derivatives Hives    Current Outpatient Medications on File Prior to Visit  Medication Sig Dispense Refill  . ALPRAZolam (XANAX) 0.25 MG tablet TAKE ONE TABLET BY MOUTH DAILY AS NEEDED 30 tablet 0  . Calcium Carbonate-Vitamin D (CALTRATE 600+D) 600-400 MG-UNIT per tablet Take 1 tablet by mouth 2 (two) times daily.    . cholecalciferol (VITAMIN D) 1000 UNITS tablet Take 3,000 Units by mouth daily.     . citalopram (CELEXA) 20 MG tablet TAKE 1 TABLET BY MOUTH ONCE DAILY 30 tablet 3  . DEXILANT 60 MG capsule Take 1 capsule (60 mg total) by mouth daily. 30 capsule 5  . LORazepam (ATIVAN) 1 MG tablet Take 1 mg by mouth 2 (two) times daily as needed.    Marland Kitchen OLANZapine (ZYPREXA) 5 MG tablet Take 1 tab at night    . OXYGEN Inhale 2 L into the lungs.     Marland Kitchen Spacer/Aero-Holding Chambers (AEROCHAMBER MV) inhaler Use as instructed 1 each 0  . Tiotropium Bromide-Olodaterol (STIOLTO RESPIMAT) 2.5-2.5 MCG/ACT AERS Inhale 2 puffs into the lungs daily. 1 Inhaler 5  . VENTOLIN HFA 108 (90 Base) MCG/ACT inhaler INHALE TWO PUFFS INTO THE LUNGS EVERY 6 HOURS AS NEEDED FOR WHEEZING OR SHORTNESS OF BREATH. 18 Inhaler 5  . alendronate (FOSAMAX) 10 MG tablet Take 1 tablet (10 mg total) by mouth daily before breakfast. Take with a full glass of water on an empty stomach. (Patient not taking: Reported on 11/11/2017) 30 tablet 11   No current facility-administered medications on file prior to visit.     BP 120/80 (BP Location: Right Arm, Cuff Size: Normal)   Pulse 98   Temp 98.2 F (36.8 C) (Oral)   Resp 20   Ht 5\' 2"  (1.575 m)   Wt 137 lb 9.6 oz (62.4 kg)   SpO2 98%   BMI 25.17 kg/m  Objective:   Physical  Exam  Constitutional: She appears well-developed and well-nourished.  Cardiovascular: Normal rate, regular rhythm and normal heart sounds.  No murmur heard. Pulmonary/Chest: Effort normal and breath sounds normal. No respiratory distress. She has no wheezes.  Neurological: She is alert.  Psychiatric: Her speech is normal and behavior is normal. Her affect is blunt.          Assessment & Plan:  Depression-reports that this is stable.  She continues to follow with psychiatry.  Diarrhea-this appears to be triggered by certain foods.  She notes that hamburgers and certain foods will trigger it.  We discussed avoiding associated foods.  She denies history of any fecal incontinence when she does not have diarrhea.  Advised okay to take Imodium on an as-needed basis.  Urinary urgency-has some occasional incontinence.  Will obtain urinalysis and culture to rule out infection.  We did discuss Keagle exercises.  I do not think she would be a good surgical candidate for any type of bladder procedure given her COPD.  COPD-she continues oxygen and stioloto respimat.  Lung exam is normal today.  I advised the patient to add albuterol 2 puffs twice daily standing for the next few weeks to see if this helps with her symptoms.  I have also advised her to arrange follow-up with her pulmonologist.  Allergic rhinitis- clear nasal drainage x1 month we will add Claritin 10 mg by mouth once daily.  Osteoporosis-reviewed results of most recent DEXA scan with patient and daughter.  Advised her to begin Fosamax once weekly to sit upright for 90 minutes after taking.  Patient is agreeable and verbalizes understanding.  b12 deficiency-she will be due for Prolia B12 injection in approximately 2 weeks.  Patient is due for Prevnar 13 which will be given today.

## 2017-11-11 NOTE — Addendum Note (Signed)
Addended by: Kelle Darting A on: 11/11/2017 01:41 PM   Modules accepted: Orders

## 2017-11-11 NOTE — Patient Instructions (Signed)
Please complete lab work prior to leaving. Do 10 Kegel exercises every time you sit on the toilet to strengthen your pelvic floor. Use albuterol 2 puffs twice a day for the next few weeks. Add claritin 10mg  once daily for your nasal congestion.  You may use imodium as needed for diarrhea. Try to avoid trigger foods.

## 2017-11-12 ENCOUNTER — Telehealth: Payer: Self-pay | Admitting: Family

## 2017-11-12 MED ORDER — CIPROFLOXACIN HCL 250 MG PO TABS: 250.0000 mg | ORAL_TABLET | Freq: Two times a day (BID) | ORAL | 0 refills | Status: DC

## 2017-11-12 NOTE — Telephone Encounter (Signed)
Please contact patient's daughter and let her know that the patient's urine culture is showing an infection.  I would like for the patient to begin Cipro twice daily.  A prescription is sent to her pharmacy.

## 2017-11-12 NOTE — Telephone Encounter (Signed)
Opened in error

## 2017-11-12 NOTE — Telephone Encounter (Signed)
L/m for daughter to call back.

## 2017-11-12 NOTE — Telephone Encounter (Signed)
Patient's daughter advised of results and to pick up rx

## 2017-11-13 LAB — URINE CULTURE
MICRO NUMBER: 90734104
SPECIMEN QUALITY:: ADEQUATE

## 2017-11-18 ENCOUNTER — Other Ambulatory Visit: Payer: Self-pay | Admitting: Family

## 2017-11-18 DIAGNOSIS — Z1231 Encounter for screening mammogram for malignant neoplasm of breast: Secondary | ICD-10-CM

## 2017-11-20 ENCOUNTER — Ambulatory Visit (INDEPENDENT_AMBULATORY_CARE_PROVIDER_SITE_OTHER): Payer: Medicare Other

## 2017-11-20 DIAGNOSIS — E538 Deficiency of other specified B group vitamins: Secondary | ICD-10-CM

## 2017-11-20 MED ORDER — CYANOCOBALAMIN 1000 MCG/ML IJ SOLN
1000.0000 ug | Freq: Once | INTRAMUSCULAR | Status: AC
  Administered 2017-11-20: 1000 ug via INTRAMUSCULAR

## 2017-11-20 NOTE — Progress Notes (Signed)
Noted  Yvonne R Lowne Chase, DO  

## 2017-11-20 NOTE — Progress Notes (Signed)
Pre visit review using our clinic tool,if applicable. No additional management support is needed unless otherwise documented below in the visit note.   Pt here for monthly B12 injection per order from [name] NP.  B12 1092mcg given IM ledft deltoid and pt tolerated injection well.  Next B12 injection scheduled for July 26.2019 patient notified.

## 2017-12-09 ENCOUNTER — Other Ambulatory Visit: Payer: Self-pay | Admitting: Emergency Medicine

## 2017-12-14 ENCOUNTER — Ambulatory Visit: Payer: Medicare Other | Admitting: *Deleted

## 2017-12-22 ENCOUNTER — Ambulatory Visit (INDEPENDENT_AMBULATORY_CARE_PROVIDER_SITE_OTHER): Payer: Medicare Other

## 2017-12-22 DIAGNOSIS — E538 Deficiency of other specified B group vitamins: Secondary | ICD-10-CM

## 2017-12-22 MED ORDER — CYANOCOBALAMIN 1000 MCG/ML IJ SOLN
1000.0000 ug | Freq: Once | INTRAMUSCULAR | Status: AC
  Administered 2017-12-22: 1000 ug via INTRAMUSCULAR

## 2017-12-22 NOTE — Progress Notes (Signed)
Pre visit review using our clinic tool,if applicable. No additional management support is needed unless otherwise documented below in the visit note.   Pt here for monthly B12 injection per order from [name] NP.  B12 1021mcg given IM right deltoid,  patient tolerated injection well.  No complaints voiced this visit.   Next B12 injection scheduled for day of Wellness visit appointment.

## 2017-12-29 ENCOUNTER — Ambulatory Visit
Admission: RE | Admit: 2017-12-29 | Discharge: 2017-12-29 | Disposition: A | Discharge status: Home / Self Care | Payer: Medicare Other | Source: Ambulatory Visit | Attending: Family | Admitting: Family

## 2017-12-29 DIAGNOSIS — Z1231 Encounter for screening mammogram for malignant neoplasm of breast: Secondary | ICD-10-CM

## 2018-01-19 NOTE — Progress Notes (Signed)
Subjective:   Abigail Castillo is a 67 y.o. female who presents for Medicare Annual (Subsequent) preventive examination.  Pt accompanied by daughter. Pt does not drive.  Review of Systems: No ROS.  Medicare Wellness Visit. Additional risk factors are reflected in the social history. Cardiac Risk Factors include: advanced age (>76men, >61 women);dyslipidemia;sedentary lifestyle Sleep patterns: Takes Zyprexa at night. Sleeps 6 hrs. Naps as needed. Home Safety/Smoke Alarms: Feels safe in home. Smoke alarms in place. Lives with son. Daughter lives close by. No stairs. Step over tub.   Female:   Pap-  Hysterectomy.     Mammo-   utd    Dexa scan- utd       CCS- declines     Objective:     Vitals: BP 120/76 (BP Location: Left Arm, Patient Position: Sitting, Cuff Size: Normal)   Pulse 99   Ht 5\' 2"  (1.575 m)   Wt 144 lb 6.4 oz (65.5 kg)   SpO2 95%   BMI 26.41 kg/m   Body mass index is 26.41 kg/m.  Advanced Directives 01/22/2018 12/11/2016 11/22/2015 11/13/2015 08/10/2015 04/16/2015 03/28/2015  Does Patient Have a Medical Advance Directive? No No No No No No No  Would patient like information on creating a medical advance directive? Yes (MAU/Ambulatory/Procedural Areas - Information given) Yes (MAU/Ambulatory/Procedural Areas - Information given) No - patient declined information No - patient declined information No - patient declined information - -    Tobacco Social History   Tobacco Use  Smoking Status Former Smoker  . Types: Cigarettes  . Last attempt to quit: 08/12/2011  . Years since quitting: 6.4  Smokeless Tobacco Never Used     Counseling given: Not Answered   Clinical Intake: Pain : No/denies pain    Past Medical History:  Diagnosis Date  . Anxiety   . Blood transfusion   . Blood transfusion without reported diagnosis   . Chronic mental illness   . COPD (chronic obstructive pulmonary disease) ([place])   . DEPRESSION 05/31/2009  . Emphysema of lung ([place])   .  Esophageal stricture 03/14/2015  . History of home oxygen therapy   . History of pneumonia   . Osteoporosis   . Shortness of breath dyspnea   . Tobacco abuse    ongoing  . Vitamin B 12 deficiency   . Vitamin D deficiency    Past Surgical History:  Procedure Laterality Date  . ABDOMINAL HYSTERECTOMY    . BREAST EXCISIONAL BIOPSY Left 2017  . BREAST LUMPECTOMY  1972   left breast  . BREAST LUMPECTOMY WITH RADIOACTIVE SEED LOCALIZATION Left 11/22/2015   Procedure: LEFT BREAST LUMPECTOMY WITH RADIOACTIVE SEED LOCALIZATION;  Surgeon: Erroll Luna, MD;  Location: [facility];  Service: General;  Laterality: Left;  . BREAST SURGERY  ?2015   lumpectomy, left  . CERVICAL CONE BIOPSY    . CHOLECYSTECTOMY    . COLONOSCOPY    . ESOPHAGOGASTRODUODENOSCOPY (EGD) WITH ESOPHAGEAL DILATION    . UPPER GASTROINTESTINAL ENDOSCOPY     Family History  Problem Relation Age of Onset  . Coronary artery disease Mother   . Diabetes Mother   . Hypertension Mother   . Coronary artery disease Father   . Diabetes Father        Grandfather  . Hypertension Father   . Stroke Father   . Other Father        colostomy bag  . Breast cancer Sister   . Cancer Other  niece--breast  . Diabetes Sister   . Hypertension Sister   . Mental illness Sister        x 4  . Esophageal cancer Neg Hx   . Stomach cancer Neg Hx   . Rectal cancer Neg Hx   . Colon cancer Neg Hx   . Colon polyps Neg Hx    Social History   Socioeconomic History  . Marital status: Divorced    Spouse name: Not on file  . Number of children: 3  . Years of education: Not on file  . Highest education level: Not on file  Occupational History  . Occupation: disabled    Fish farm manager: UNEMPLOYED  Social Needs  . Financial resource strain: Not on file  . Food insecurity:    Worry: Not on file    Inability: Not on file  . Transportation needs:    Medical: Not on file    Non-medical: Not on file  Tobacco Use  . Smoking status: Former  Smoker    Types: Cigarettes    Last attempt to quit: 08/12/2011    Years since quitting: 6.4  . Smokeless tobacco: Never Used  Substance and Sexual Activity  . Alcohol use: No    Alcohol/week: 0.0 standard drinks  . Drug use: No  . Sexual activity: Never  Lifestyle  . Physical activity:    Days per week: Not on file    Minutes per session: Not on file  . Stress: Not on file  Relationships  . Social connections:    Talks on phone: Not on file    Gets together: Not on file    Attends religious service: Not on file    Active member of club or organization: Not on file    Attends meetings of clubs or organizations: Not on file    Relationship status: Not on file  Other Topics Concern  . Not on file  Social History Narrative   Lost her son to Lung CA 1/18   Lives in a group home   Has a daughter who lives locally    Outpatient Encounter Medications as of 01/22/2018  Medication Sig  . Calcium Carbonate-Vitamin D (CALTRATE 600+D) 600-400 MG-UNIT per tablet Take 1 tablet by mouth 2 (two) times daily.  . cholecalciferol (VITAMIN D) 1000 UNITS tablet Take 3,000 Units by mouth daily.   Marland Kitchen DEXILANT 60 MG capsule Take 1 capsule (60 mg total) by mouth daily.  Marland Kitchen OLANZapine (ZYPREXA) 5 MG tablet 7.5 mg.   . OXYGEN Inhale 2 L into the lungs.   Marland Kitchen Spacer/Aero-Holding Chambers (AEROCHAMBER MV) inhaler Use as instructed  . STIOLTO RESPIMAT 2.5-2.5 MCG/ACT AERS INHALE TWO PUFFS INTO THE LUNGS DAILY.  Marland Kitchen Tiotropium Bromide-Olodaterol (STIOLTO RESPIMAT) 2.5-2.5 MCG/ACT AERS Inhale 2 puffs into the lungs daily.  . VENTOLIN HFA 108 (90 Base) MCG/ACT inhaler INHALE TWO PUFFS INTO THE LUNGS EVERY 6 HOURS AS NEEDED FOR WHEEZING OR SHORTNESS OF BREATH.  Marland Kitchen alendronate (FOSAMAX) 10 MG tablet Take 1 tablet (10 mg total) by mouth daily before breakfast. Take with a full glass of water on an empty stomach. (Patient not taking: Reported on 01/22/2018)  . ALPRAZolam (XANAX) 0.25 MG tablet TAKE ONE TABLET BY MOUTH  DAILY AS NEEDED (Patient not taking: Reported on 01/22/2018)  . citalopram (CELEXA) 20 MG tablet TAKE 1 TABLET BY MOUTH ONCE DAILY (Patient not taking: Reported on 01/22/2018)  . LORazepam (ATIVAN) 1 MG tablet Take 1 mg by mouth 2 (two) times daily as needed.  . [  DISCONTINUED] ciprofloxacin (CIPRO) 250 MG tablet Take 1 tablet (250 mg total) by mouth 2 (two) times daily.   No facility-administered encounter medications on file as of 01/22/2018.     Activities of Daily Living In your present state of health, do you have any difficulty performing the following activities: 01/22/2018  Hearing? N  Vision? N  Difficulty concentrating or making decisions? N  Walking or climbing stairs? Y  Comment SOB  Dressing or bathing? N  Doing errands, shopping? Y  Comment does not Physiological scientist and eating ? N  Using the Toilet? N  In the past six months, have you accidently leaked urine? N  Do you have problems with loss of bowel control? N  Managing your Medications? N  Managing your Finances? N  Housekeeping or managing your Housekeeping? N  Some recent data might be hidden    Patient Care Team: [name], NP as PCP - General (Internal Medicine) Gatha Mayer, MD as Consulting Physician (Gastroenterology) Lendon Colonel, MD as Referring Physician (Psychiatry)    Assessment:   This is a routine wellness examination for [place]. Physical assessment deferred to PCP.   Exercise Activities and Dietary recommendations Current Exercise Habits: The patient does not participate in regular exercise at present, Exercise limited by: respiratory conditions(s) Diet (meal preparation, eat out, water intake, caffeinated beverages, dairy products, fruits and vegetables): 24 hr recall Breakfast: eggs Lunch: Burger Dinner:  Vegetable soup Pt reports she needs to drink more water.  Goals    . Increase physical activity     As tolerated.       Fall Risk Fall Risk  01/22/2018 12/11/2016  07/13/2015 07/13/2015 02/03/2014  Falls in the past year? No No No No No    Depression Screen PHQ 2/9 Scores 01/22/2018 11/11/2017 12/11/2016 07/13/2015  PHQ - 2 Score 0 2 0 0  PHQ- 9 Score - 8 - -     Cognitive Function MMSE - Mini Mental State Exam 01/22/2018 12/11/2016  Orientation to time 4 5  Orientation to Place 5 5  Registration 3 3  Attention/ Calculation 5 5  Recall 2 2  Language- name 2 objects 2 2  Language- repeat 1 1  Language- follow 3 step command 3 3  Language- read & follow direction 1 1  Write a sentence 1 1  Copy design 1 1  Total score 28 29        Immunization History  Administered Date(s) Administered  . Influenza Whole 03/02/2008, 04/09/2010  . Influenza, High Dose Seasonal PF 03/13/2017  . Influenza, Seasonal, Injecte, Preservative Fre 06/18/2012  . Influenza,inj,Quad PF,6+ Mos 02/01/2013, 02/03/2014, 02/05/2015, 03/10/2016  . PPD Test 12/17/2010  . Pneumococcal Conjugate-13 11/11/2017  . Pneumococcal Polysaccharide-23 03/02/2008, 02/01/2013  . Tdap 05/26/2008    Screening Tests Health Maintenance  Topic Date Due  . COLONOSCOPY  02/11/2017  . INFLUENZA VACCINE  02/23/2018 (Originally 12/24/2017)  . Hepatitis C Screening  11/12/2018 (Originally 04-12-1951)  . TETANUS/TDAP  05/26/2018  . PNA vac Low Risk Adult (2 of 2 - PPSV23) 11/12/2018  . MAMMOGRAM  12/30/2018  . DEXA SCAN  Completed  . HIV Screening  Completed       Plan:    Per Melissa NP- Go to lab today. Received B12 injection today.  Please schedule your next medicare wellness visit with me in 1 yr.  Continue to eat heart healthy diet (full of fruits, vegetables, whole grains, lean protein, water--limit salt, fat, and sugar intake) and increase  physical activity as tolerated.  Continue doing brain stimulating activities (puzzles, reading, adult coloring books, staying active) to keep memory sharp.   Bring a copy of your living will and/or healthcare power of attorney to your next  office visit.  I have personally reviewed and noted the following in the patient's chart:   . Medical and social history . Use of alcohol, tobacco or illicit drugs  . Current medications and supplements . Functional ability and status . Nutritional status . Physical activity . Advanced directives . List of other physicians . Hospitalizations, surgeries, and ER visits in previous 12 months . Vitals . Screenings to include cognitive, depression, and falls . Referrals and appointments  In addition, I have reviewed and discussed with patient certain preventive protocols, quality metrics, and best practice recommendations. A written personalized care plan for preventive services as well as general preventive health recommendations were provided to patient.     Shela Nevin, [place]  01/22/2018

## 2018-01-20 ENCOUNTER — Telehealth: Payer: Self-pay | Admitting: Emergency Medicine

## 2018-01-20 NOTE — Telephone Encounter (Signed)
PA request received from [email or web address] Key: [id number] PA processed, and [employer] has been approved from 10/21/17-01/19/2019.   Nothing further needed; will close encounter.

## 2018-01-22 ENCOUNTER — Encounter: Payer: Self-pay | Admitting: *Deleted

## 2018-01-22 ENCOUNTER — Ambulatory Visit (INDEPENDENT_AMBULATORY_CARE_PROVIDER_SITE_OTHER): Payer: Medicare Other | Admitting: *Deleted

## 2018-01-22 ENCOUNTER — Encounter: Payer: Self-pay | Admitting: Family

## 2018-01-22 VITALS — BP 120/76 | HR 99 | Ht 62.0 in | Wt 144.4 lb

## 2018-01-22 DIAGNOSIS — Z0000 Encounter for general adult medical examination without abnormal findings: Secondary | ICD-10-CM | POA: Diagnosis not present

## 2018-01-22 DIAGNOSIS — E538 Deficiency of other specified B group vitamins: Secondary | ICD-10-CM | POA: Diagnosis not present

## 2018-01-22 LAB — VITAMIN B12: Vitamin B-12: 635 pg/mL (ref 211–911)

## 2018-01-22 MED ORDER — CYANOCOBALAMIN 1000 MCG/ML IJ SOLN
1000.0000 ug | Freq: Once | INTRAMUSCULAR | Status: AC
  Administered 2018-01-22: 1000 ug via INTRAMUSCULAR

## 2018-01-22 NOTE — Progress Notes (Signed)
Reviewed and agree.  Teresa Nicodemus S [name] NP 

## 2018-01-22 NOTE — Patient Instructions (Signed)
Per Abigail Sciara NP- Go to lab today. Received B12 injection today.  Please schedule your next medicare wellness visit with me in 1 yr.  Continue to eat heart healthy diet (full of fruits, vegetables, whole grains, lean protein, water--limit salt, fat, and sugar intake) and increase physical activity as tolerated.  Continue doing brain stimulating activities (puzzles, reading, adult coloring books, staying active) to keep memory sharp.   Bring a copy of your living will and/or healthcare power of attorney to your next office visit.   Abigail Castillo , Thank you for taking time to come for your Medicare Wellness Visit. I appreciate your ongoing commitment to your health goals. Please review the following plan we discussed and let me know if I can assist you in the future.   These are the goals we discussed: Goals    . Increase physical activity     As tolerated.       This is a list of the screening recommended for you and due dates:  Health Maintenance  Topic Date Due  . Colon Cancer Screening  02/11/2017  . Flu Shot  02/23/2018*  .  Hepatitis C: One time screening is recommended by Center for Disease Control  (CDC) for  adults born from [birth year] through 1964.   11/12/2018*  . Tetanus Vaccine  05/26/2018  . Pneumonia vaccines (2 of 2 - PPSV23) 11/12/2018  . Mammogram  12/30/2018  . DEXA scan (bone density measurement)  Completed  . HIV Screening  Completed  *Topic was postponed. The date shown is not the original due date.    Health Maintenance for Postmenopausal Women Menopause is a normal process in which your reproductive ability comes to an end. This process happens gradually over a span of months to years, usually between the ages of 95 and 89. Menopause is complete when you have missed 12 consecutive menstrual periods. It is important to talk with your health care provider about some of the most common conditions that affect postmenopausal women, such as heart disease, cancer, and  bone loss (osteoporosis). Adopting a healthy lifestyle and getting preventive care can help to promote your health and wellness. Those actions can also lower your chances of developing some of these common conditions. What should I know about menopause? During menopause, you may experience a number of symptoms, such as:  Moderate-to-severe hot flashes.  Night sweats.  Decrease in sex drive.  Mood swings.  Headaches.  Tiredness.  Irritability.  Memory problems.  Insomnia.  Choosing to treat or not to treat menopausal changes is an individual decision that you make with your health care provider. What should I know about hormone replacement therapy and supplements? Hormone therapy products are effective for treating symptoms that are associated with menopause, such as hot flashes and night sweats. Hormone replacement carries certain risks, especially as you become older. If you are thinking about using estrogen or estrogen with progestin treatments, discuss the benefits and risks with your health care provider. What should I know about heart disease and stroke? Heart disease, heart attack, and stroke become more likely as you age. This may be due, in part, to the hormonal changes that your body experiences during menopause. These can affect how your body processes dietary fats, triglycerides, and cholesterol. Heart attack and stroke are both medical emergencies. There are many things that you can do to help prevent heart disease and stroke:  Have your blood pressure checked at least every 1-2 years. High blood pressure causes heart disease and  increases the risk of stroke.  If you are 86-32 years old, ask your health care provider if you should take aspirin to prevent a heart attack or a stroke.  Do not use any tobacco products, including cigarettes, chewing tobacco, or electronic cigarettes. If you need help quitting, ask your health care provider.  It is important to eat a healthy  diet and maintain a healthy weight. ? Be sure to include plenty of vegetables, fruits, low-fat dairy products, and lean protein. ? Avoid eating foods that are high in solid fats, added sugars, or salt (sodium).  Get regular exercise. This is one of the most important things that you can do for your health. ? Try to exercise for at least 150 minutes each week. The type of exercise that you do should increase your heart rate and make you sweat. This is known as moderate-intensity exercise. ? Try to do strengthening exercises at least twice each week. Do these in addition to the moderate-intensity exercise.  Know your numbers.Ask your health care provider to check your cholesterol and your blood glucose. Continue to have your blood tested as directed by your health care provider.  What should I know about cancer screening? There are several types of cancer. Take the following steps to reduce your risk and to catch any cancer development as early as possible. Breast Cancer  Practice breast self-awareness. ? This means understanding how your breasts normally appear and feel. ? It also means doing regular breast self-exams. Let your health care provider know about any changes, no matter how small.  If you are 80 or older, have a clinician do a breast exam (clinical breast exam or CBE) every year. Depending on your age, family history, and medical history, it may be recommended that you also have a yearly breast X-ray (mammogram).  If you have a family history of breast cancer, talk with your health care provider about genetic screening.  If you are at high risk for breast cancer, talk with your health care provider about having an MRI and a mammogram every year.  Breast cancer (BRCA) gene test is recommended for women who have family members with BRCA-related cancers. Results of the assessment will determine the need for genetic counseling and BRCA1 and for BRCA2 testing. BRCA-related cancers  include these types: ? Breast. This occurs in males or females. ? Ovarian. ? Tubal. This may also be called fallopian tube cancer. ? Cancer of the abdominal or pelvic lining (peritoneal cancer). ? Prostate. ? Pancreatic.  Cervical, Uterine, and Ovarian Cancer Your health care provider may recommend that you be screened regularly for cancer of the pelvic organs. These include your ovaries, uterus, and vagina. This screening involves a pelvic exam, which includes checking for microscopic changes to the surface of your cervix (Pap test).  For women ages 21-65, health care providers may recommend a pelvic exam and a Pap test every three years. For women ages 42-65, they may recommend the Pap test and pelvic exam, combined with testing for human papilloma virus (HPV), every five years. Some types of HPV increase your risk of cervical cancer. Testing for HPV may also be done on women of any age who have unclear Pap test results.  Other health care providers may not recommend any screening for nonpregnant women who are considered low risk for pelvic cancer and have no symptoms. Ask your health care provider if a screening pelvic exam is right for you.  If you have had past treatment for cervical  cancer or a condition that could lead to cancer, you need Pap tests and screening for cancer for at least 20 years after your treatment. If Pap tests have been discontinued for you, your risk factors (such as having a new sexual partner) need to be reassessed to determine if you should start having screenings again. Some women have medical problems that increase the chance of getting cervical cancer. In these cases, your health care provider may recommend that you have screening and Pap tests more often.  If you have a family history of uterine cancer or ovarian cancer, talk with your health care provider about genetic screening.  If you have vaginal bleeding after reaching menopause, tell your health care  provider.  There are currently no reliable tests available to screen for ovarian cancer.  Lung Cancer Lung cancer screening is recommended for adults 59-35 years old who are at high risk for lung cancer because of a history of smoking. A yearly low-dose CT scan of the lungs is recommended if you:  Currently smoke.  Have a history of at least 30 pack-years of smoking and you currently smoke or have quit within the past 15 years. A pack-year is smoking an average of one pack of cigarettes per day for one year.  Yearly screening should:  Continue until it has been 15 years since you quit.  Stop if you develop a health problem that would prevent you from having lung cancer treatment.  Colorectal Cancer  This type of cancer can be detected and can often be prevented.  Routine colorectal cancer screening usually begins at age 14 and continues through age 57.  If you have risk factors for colon cancer, your health care provider may recommend that you be screened at an earlier age.  If you have a family history of colorectal cancer, talk with your health care provider about genetic screening.  Your health care provider may also recommend using home test kits to check for hidden blood in your stool.  A small camera at the end of a tube can be used to examine your colon directly (sigmoidoscopy or colonoscopy). This is done to check for the earliest forms of colorectal cancer.  Direct examination of the colon should be repeated every 5-10 years until age 12. However, if early forms of precancerous polyps or small growths are found or if you have a family history or genetic risk for colorectal cancer, you may need to be screened more often.  Skin Cancer  Check your skin from head to toe regularly.  Monitor any moles. Be sure to tell your health care provider: ? About any new moles or changes in moles, especially if there is a change in a mole's shape or color. ? If you have a mole that is  larger than the size of a pencil eraser.  If any of your family members has a history of skin cancer, especially at a young age, talk with your health care provider about genetic screening.  Always use sunscreen. Apply sunscreen liberally and repeatedly throughout the day.  Whenever you are outside, protect yourself by wearing long sleeves, pants, a wide-brimmed hat, and sunglasses.  What should I know about osteoporosis? Osteoporosis is a condition in which bone destruction happens more quickly than new bone creation. After menopause, you may be at an increased risk for osteoporosis. To help prevent osteoporosis or the bone fractures that can happen because of osteoporosis, the following is recommended:  If you are 62-41 years old,  get at least 1,000 mg of calcium and at least 600 mg of vitamin D per day.  If you are older than age 33 but younger than age 27, get at least 1,200 mg of calcium and at least 600 mg of vitamin D per day.  If you are older than age 78, get at least 1,200 mg of calcium and at least 800 mg of vitamin D per day.  Smoking and excessive alcohol intake increase the risk of osteoporosis. Eat foods that are rich in calcium and vitamin D, and do weight-bearing exercises several times each week as directed by your health care provider. What should I know about how menopause affects my mental health? Depression may occur at any age, but it is more common as you become older. Common symptoms of depression include:  Low or sad mood.  Changes in sleep patterns.  Changes in appetite or eating patterns.  Feeling an overall lack of motivation or enjoyment of activities that you previously enjoyed.  Frequent crying spells.  Talk with your health care provider if you think that you are experiencing depression. What should I know about immunizations? It is important that you get and maintain your immunizations. These include:  Tetanus, diphtheria, and pertussis (Tdap)  booster vaccine.  Influenza every year before the flu season begins.  Pneumonia vaccine.  Shingles vaccine.  Your health care provider may also recommend other immunizations. This information is not intended to replace advice given to you by your health care provider. Make sure you discuss any questions you have with your health care provider. Document Released: 07/04/2005 Document Revised: 11/30/2015 Document Reviewed: 02/13/2015 Elsevier Interactive Patient Education  2018 [employer].

## 2018-02-22 ENCOUNTER — Ambulatory Visit: Payer: Medicare Other

## 2018-02-25 ENCOUNTER — Ambulatory Visit (INDEPENDENT_AMBULATORY_CARE_PROVIDER_SITE_OTHER): Payer: Medicare Other

## 2018-02-25 DIAGNOSIS — E538 Deficiency of other specified B group vitamins: Secondary | ICD-10-CM

## 2018-02-25 MED ORDER — CYANOCOBALAMIN 1000 MCG/ML IJ SOLN
1000.0000 ug | Freq: Once | INTRAMUSCULAR | Status: AC
  Administered 2018-02-25: 1000 ug via INTRAMUSCULAR

## 2018-02-25 NOTE — Progress Notes (Signed)
Pre visit review using our clinic tool,if applicable. No additional management support is needed unless otherwise documented below in the visit note.   Pt here for monthly B12 injection per order from [facility].  B12 1031mcg given IM left deltoid, and pt tolerated injection well.  Next B12 injection scheduled for 1 month . Patient aware.

## 2018-03-01 NOTE — Progress Notes (Signed)
Reviewed. ° ° °Arlicia Paquette S [name] NP °

## 2018-03-04 DIAGNOSIS — F251 Schizoaffective disorder, depressive type: Secondary | ICD-10-CM | POA: Diagnosis not present

## 2018-03-05 ENCOUNTER — Other Ambulatory Visit: Payer: Self-pay | Admitting: Emergency Medicine

## 2018-04-01 ENCOUNTER — Ambulatory Visit (INDEPENDENT_AMBULATORY_CARE_PROVIDER_SITE_OTHER): Payer: Medicare Other

## 2018-04-01 DIAGNOSIS — E538 Deficiency of other specified B group vitamins: Secondary | ICD-10-CM | POA: Diagnosis not present

## 2018-04-01 MED ORDER — CYANOCOBALAMIN 1000 MCG/ML IJ SOLN
1000.0000 ug | Freq: Once | INTRAMUSCULAR | Status: AC
  Administered 2018-04-01: 1000 ug via INTRAMUSCULAR

## 2018-04-01 NOTE — Progress Notes (Signed)
Pre visit review using our clinic tool,if applicable. No additional management support is needed unless otherwise documented below in the visit note.   Pt here for monthly B12 injection per order from [facility].   B12 1051mcg given IM right deltoid, and pt tolerated injection well. No complaints voiced this visit.  Next B12 injection scheduled for 1 month.

## 2018-04-15 ENCOUNTER — Other Ambulatory Visit: Payer: Self-pay | Admitting: Family

## 2018-05-04 ENCOUNTER — Ambulatory Visit (INDEPENDENT_AMBULATORY_CARE_PROVIDER_SITE_OTHER): Payer: Medicare Other

## 2018-05-04 DIAGNOSIS — E538 Deficiency of other specified B group vitamins: Secondary | ICD-10-CM | POA: Diagnosis not present

## 2018-05-04 MED ORDER — CYANOCOBALAMIN 1000 MCG/ML IJ SOLN
1000.0000 ug | Freq: Once | INTRAMUSCULAR | Status: AC
  Administered 2018-05-04: 1000 ug via INTRAMUSCULAR

## 2018-05-04 NOTE — Progress Notes (Signed)
Pre visit review using our clinic tool,if applicable. No additional management support is needed unless otherwise documented below in the visit note.   Pt here for monthly B12 injection per order from M. Inda Castle NP  B12 1063mcg given IM left deltoid,  pt tolerated injection well. No complaints this visit.  Next B12 injection scheduled for January 10,2020.

## 2018-05-04 NOTE — Progress Notes (Signed)
Note reviewed and agree.   Nance Pear NP

## 2018-06-03 ENCOUNTER — Other Ambulatory Visit: Payer: Self-pay | Admitting: Emergency Medicine

## 2018-06-04 ENCOUNTER — Ambulatory Visit (INDEPENDENT_AMBULATORY_CARE_PROVIDER_SITE_OTHER): Payer: Medicare Other

## 2018-06-04 DIAGNOSIS — E538 Deficiency of other specified B group vitamins: Secondary | ICD-10-CM

## 2018-06-04 MED ORDER — CYANOCOBALAMIN 1000 MCG/ML IJ SOLN
1000.0000 ug | Freq: Once | INTRAMUSCULAR | Status: AC
  Administered 2018-06-04: 1000 ug via INTRAMUSCULAR

## 2018-06-04 NOTE — Progress Notes (Signed)
Pre visit review using our clinic tool,if applicable. No additional management support is needed unless otherwise documented below in the visit note.   Pt here for monthly B12 injection per order from M. O"sullivan,NP.  B12 1084mcg given IM Right deltoid, and pt tolerated injection well.  Next B12 injection scheduled for 1 month.

## 2018-07-02 ENCOUNTER — Ambulatory Visit (INDEPENDENT_AMBULATORY_CARE_PROVIDER_SITE_OTHER): Payer: Medicare Other

## 2018-07-02 DIAGNOSIS — E538 Deficiency of other specified B group vitamins: Secondary | ICD-10-CM | POA: Diagnosis not present

## 2018-07-02 MED ORDER — CYANOCOBALAMIN 1000 MCG/ML IJ SOLN
1000.0000 ug | Freq: Once | INTRAMUSCULAR | Status: AC
  Administered 2018-07-02: 1000 ug via INTRAMUSCULAR

## 2018-07-02 NOTE — Progress Notes (Signed)
Pt here for monthly B12 injection per Earlie Counts  B12 1062mcg given IM in left deltoid and pt tolerated injection well.  Next B12 injection scheduled  07/29/17

## 2018-07-02 NOTE — Progress Notes (Signed)
Note reviewed and agree.  Nance Pear NP

## 2018-07-30 ENCOUNTER — Ambulatory Visit (INDEPENDENT_AMBULATORY_CARE_PROVIDER_SITE_OTHER): Payer: Medicare Other

## 2018-07-30 DIAGNOSIS — E538 Deficiency of other specified B group vitamins: Secondary | ICD-10-CM

## 2018-07-30 MED ORDER — CYANOCOBALAMIN 1000 MCG/ML IJ SOLN
1000.0000 ug | Freq: Once | INTRAMUSCULAR | Status: AC
  Administered 2018-07-30: 1000 ug via INTRAMUSCULAR

## 2018-07-30 NOTE — Progress Notes (Signed)
Pre visit review using our clinic review tool, if applicable. No additional management support is needed unless otherwise documented below in the visit note.  Pt here today for B12 injection. 7mL injected into L deltoid. Pt tolerated injection well. Next in 4 weeks.

## 2018-07-30 NOTE — Progress Notes (Signed)
Reviewed.  [name] NP

## 2018-08-06 IMAGING — MG DIGITAL SCREENING BILATERAL MAMMOGRAM WITH TOMO AND CAD
8 series · 9 of 24 positions shown · non-contrast
Comparison: Previous exam(s).

CLINICAL DATA: Screening.

EXAM:
DIGITAL SCREENING BILATERAL MAMMOGRAM WITH TOMO AND CAD

[L MLO synth-2D]
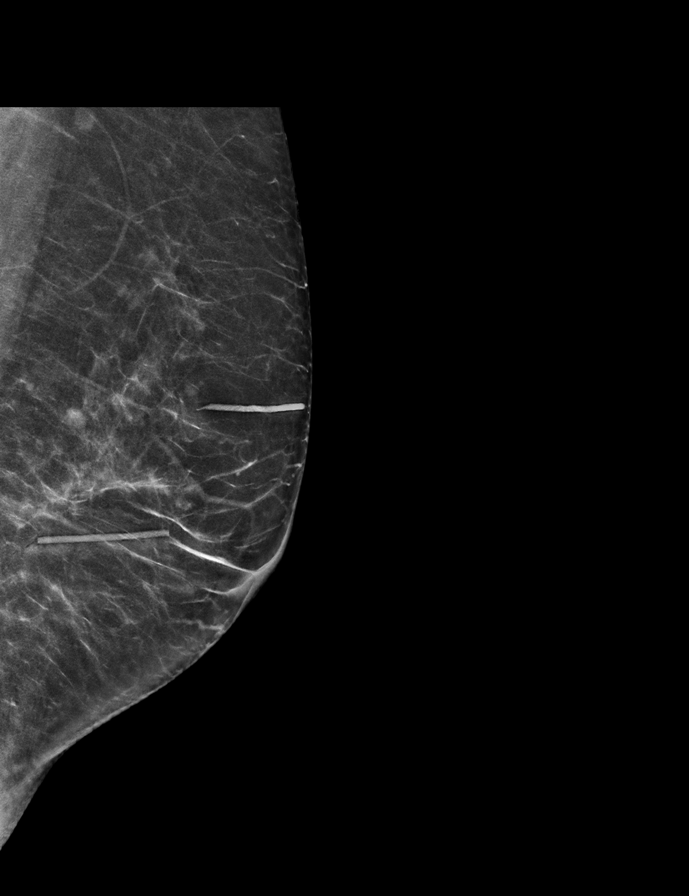

[R MLO synth-2D]
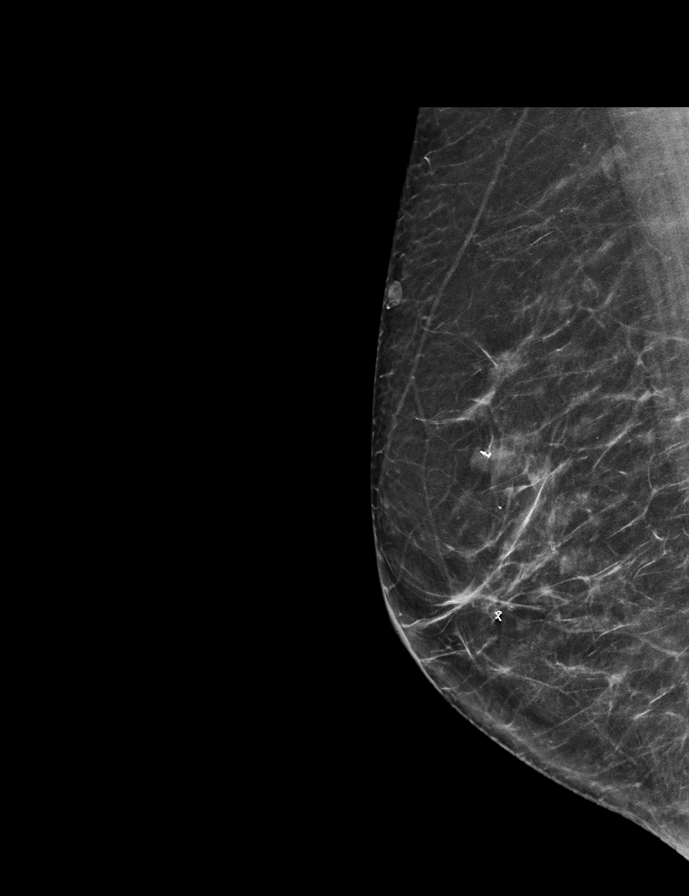

[R CC synth-2D]
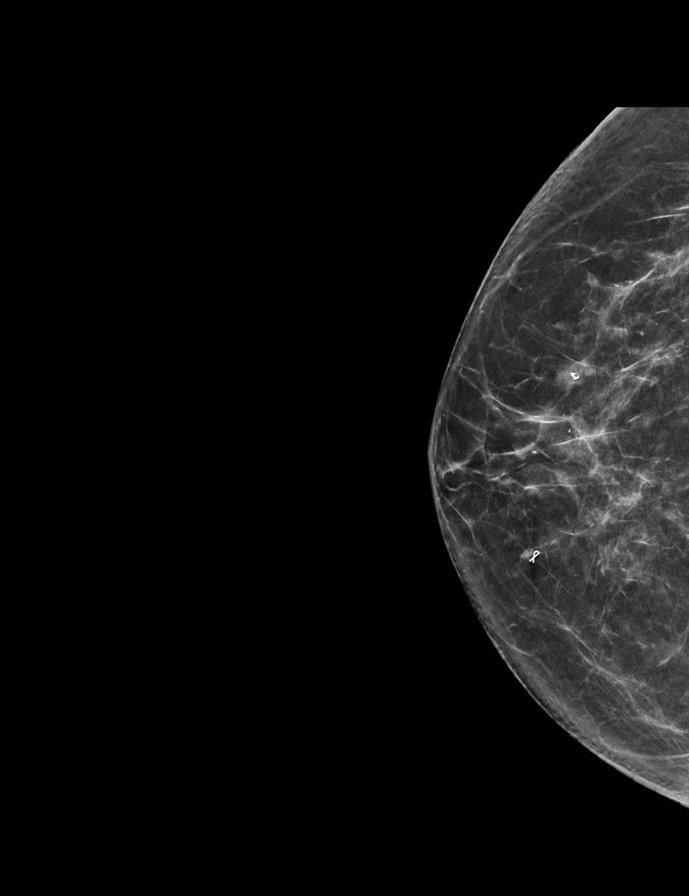

[L CC synth-2D]
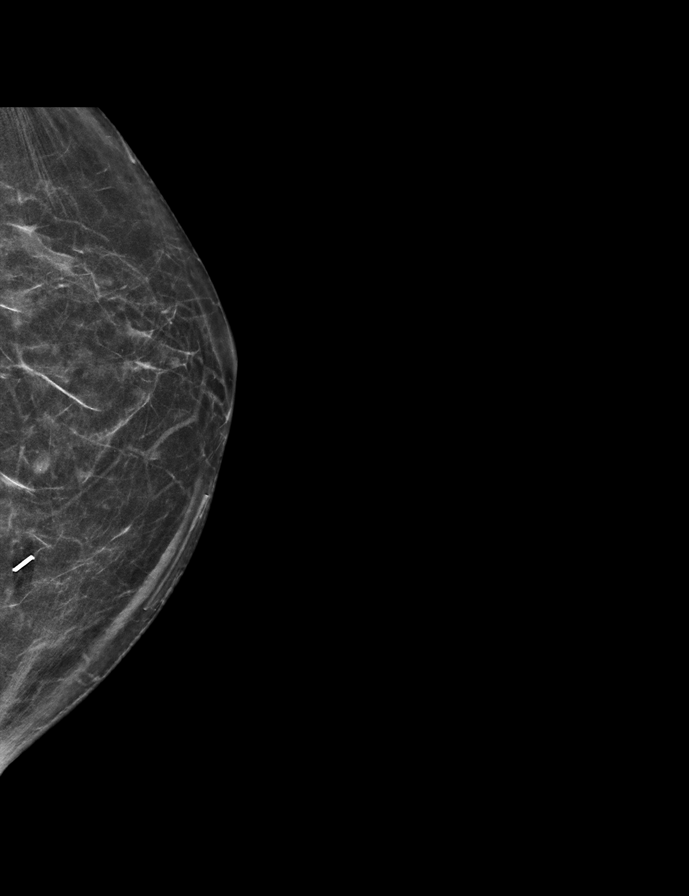

[R MLO tomo · 2 of 63 frames shown]
[frame 21/63]
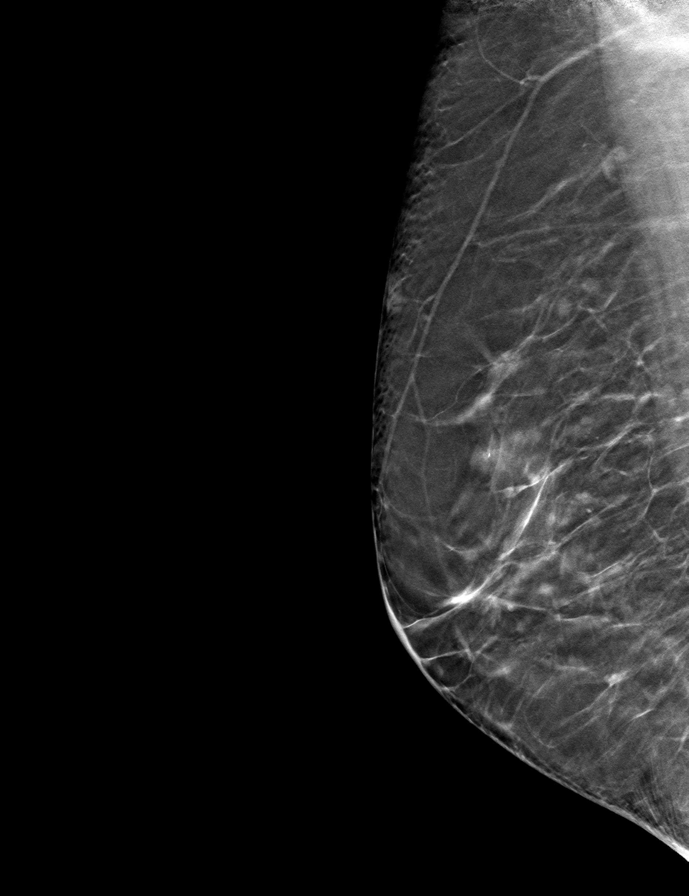
[frame 32/63]
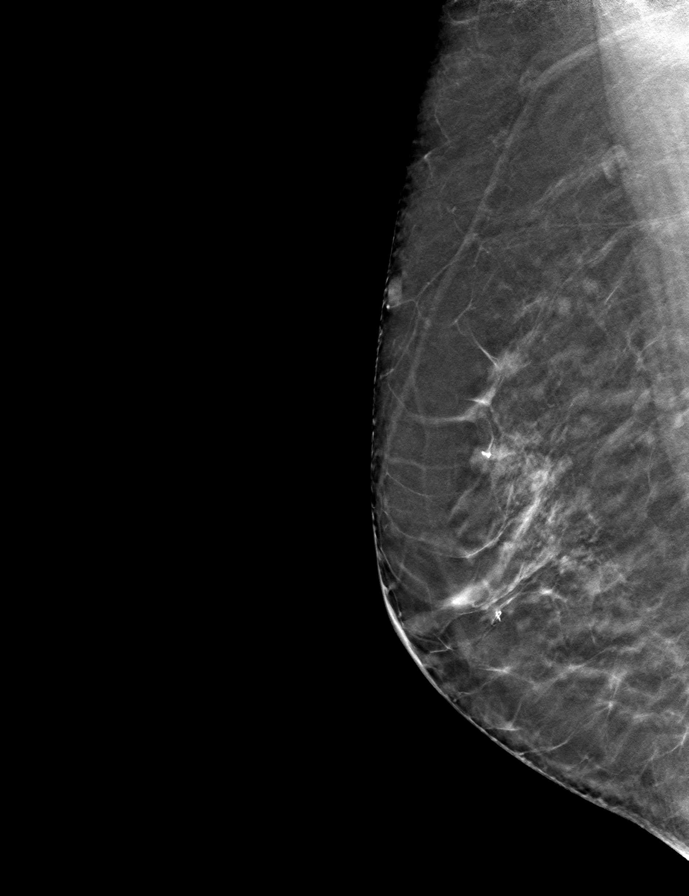

[R CC tomo · tomo slice 33/64.0]
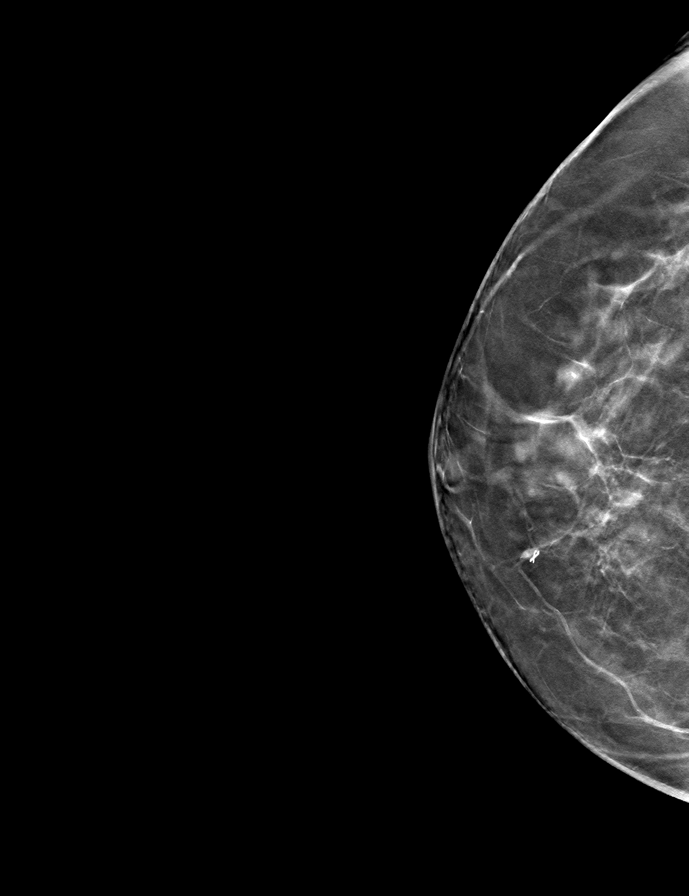

[L MLO tomo · tomo slice 31/61.0]
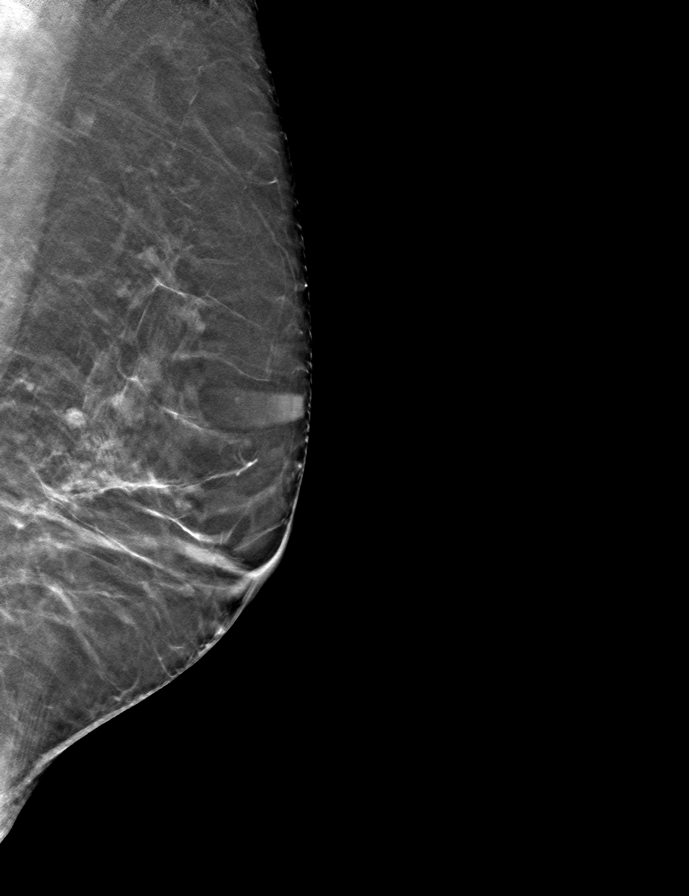

[L CC tomo · tomo slice 29/58.0]
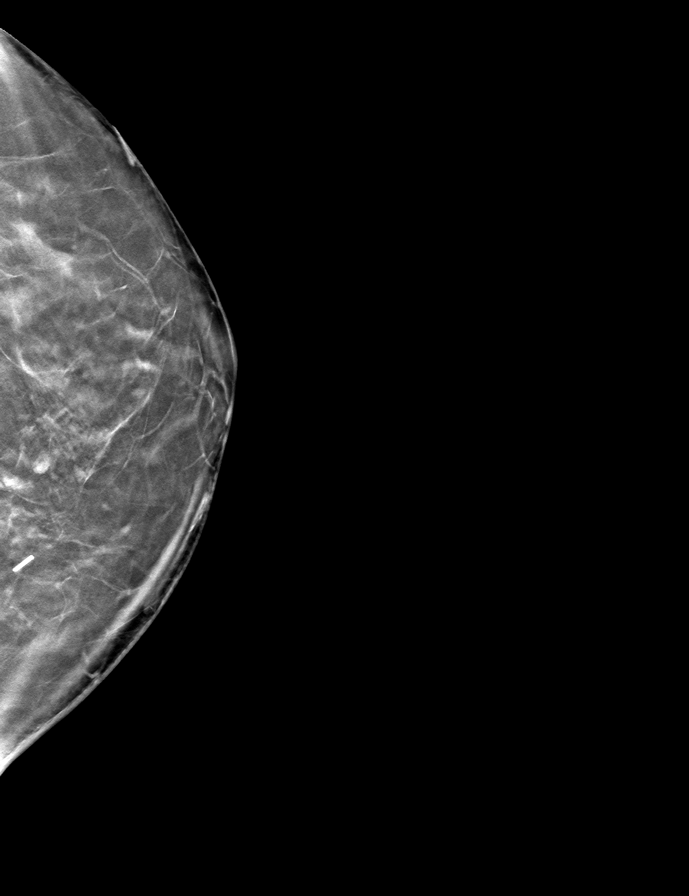

[9 of 24 positions shown; findings below may reference images not displayed]

ACR Breast Density Category b: There are scattered areas of
fibroglandular density.
FINDINGS: There are no findings suspicious for malignancy. Images were
processed with CAD.
IMPRESSION: No mammographic evidence of malignancy. A result letter of this
screening mammogram will be mailed directly to the patient.

RECOMMENDATION:
Screening mammogram in one year. (Code:[id number])

BI-RADS CATEGORY  1: Negative.

## 2018-08-26 ENCOUNTER — Other Ambulatory Visit: Payer: Self-pay | Admitting: Family

## 2018-08-30 DIAGNOSIS — F251 Schizoaffective disorder, depressive type: Secondary | ICD-10-CM | POA: Diagnosis not present

## 2018-09-07 ENCOUNTER — Ambulatory Visit: Payer: Medicare Other

## 2018-09-29 ENCOUNTER — Telehealth: Payer: Self-pay | Admitting: Family

## 2018-09-29 NOTE — Telephone Encounter (Signed)
Please contact patient's daughter and let her know that pt is due for follow up.  Let's see if we can arrange a virtual visit for her.

## 2018-09-29 NOTE — Telephone Encounter (Signed)
Patient scheduled for Friday  °

## 2018-10-01 ENCOUNTER — Encounter: Payer: Self-pay | Admitting: Family

## 2018-10-01 ENCOUNTER — Other Ambulatory Visit: Payer: Self-pay

## 2018-10-01 ENCOUNTER — Ambulatory Visit (INDEPENDENT_AMBULATORY_CARE_PROVIDER_SITE_OTHER): Payer: Medicare Other | Admitting: Family

## 2018-10-01 DIAGNOSIS — F259 Schizoaffective disorder, unspecified: Secondary | ICD-10-CM

## 2018-10-01 DIAGNOSIS — K219 Gastro-esophageal reflux disease without esophagitis: Secondary | ICD-10-CM

## 2018-10-01 DIAGNOSIS — M810 Age-related osteoporosis without current pathological fracture: Secondary | ICD-10-CM

## 2018-10-01 DIAGNOSIS — J309 Allergic rhinitis, unspecified: Secondary | ICD-10-CM | POA: Diagnosis not present

## 2018-10-01 DIAGNOSIS — J449 Chronic obstructive pulmonary disease, unspecified: Secondary | ICD-10-CM | POA: Diagnosis not present

## 2018-10-01 DIAGNOSIS — E538 Deficiency of other specified B group vitamins: Secondary | ICD-10-CM

## 2018-10-01 MED ORDER — B-12 1000 MCG SL SUBL: 1.0000 | SUBLINGUAL_TABLET | Freq: Every day | SUBLINGUAL | 5 refills | Status: DC

## 2018-10-01 NOTE — Progress Notes (Signed)
Virtual Visit via Video Note  I connected with Abigail Castillo on 10/01/18 at  7:40 AM EDT by a video enabled telemedicine application and verified that I am speaking with the correct person using two identifiers. This visit type was conducted due to national recommendations for restrictions regarding the COVID-19 Pandemic (e.g. social distancing).  This format is felt to be most appropriate for this patient at this time.   I discussed the limitations of evaluation and management by telemedicine and the availability of in person appointments. The patient expressed understanding and agreed to proceed.  The patient, her daughter and myself were on today's video visit. The patient was at home and I was in my office at the time of today's visit.   History of Present Illness:  Patient is a 68 yr old female who presents today for routine follow up.  b12- deficiency last injection was 07/30/18.    Shizoaffective Disorder-followed by psychiatry (Dr. Erling Cruz).  Reports that she has had some allergies.  Notes rhinorrhea- not taking any otc allergy medication.   GERD- reports that her symptoms are stable on dexilant.   COPD- reports that her breathing has been stable. She uses her oxygen PRN.    Observations/Objective:   Gen: Awake, alert, no acute distress Resp: Breathing is even and non-labored Psych: calm/pleasant demeanor Neuro: Alert and Oriented x 3, + facial symmetry, speech is clear.   Assessment and Plan:  b12 deficiency- due to covid and to keep her from having to come to the office I recommended that we temporarily discontinue b12 injections. Instead I will ask her to begin b12 1074mcg SL daily.  Osteoporosis- has not been taking her fosamax. We discussed restarting and sitting upright for 90 minutes after taking.  GERD- stable on PPI.  Schizoaffective disorder- stable on current meds. Management per psychiatry.  Allergic rhinitis- recommended trial of claritin 10mg  once daily.    COPD- clinically stable on prn oxygen, med management per pulmonology.      Follow Up Instructions:    I discussed the assessment and treatment plan with the patient. The patient was provided an opportunity to ask questions and all were answered. The patient agreed with the plan and demonstrated an understanding of the instructions.   The patient was advised to call back or seek an in-person evaluation if the symptoms worsen or if the condition fails to improve as anticipated.    Nance Pear, NP

## 2018-11-22 ENCOUNTER — Other Ambulatory Visit: Payer: Self-pay | Admitting: Family

## 2018-11-22 DIAGNOSIS — Z1231 Encounter for screening mammogram for malignant neoplasm of breast: Secondary | ICD-10-CM

## 2018-12-20 ENCOUNTER — Other Ambulatory Visit: Payer: Self-pay | Admitting: Family

## 2019-01-04 ENCOUNTER — Ambulatory Visit (INDEPENDENT_AMBULATORY_CARE_PROVIDER_SITE_OTHER): Payer: Medicare Other | Admitting: Family

## 2019-01-04 ENCOUNTER — Telehealth: Payer: Self-pay | Admitting: Family

## 2019-01-04 DIAGNOSIS — M810 Age-related osteoporosis without current pathological fracture: Secondary | ICD-10-CM | POA: Diagnosis not present

## 2019-01-04 DIAGNOSIS — J309 Allergic rhinitis, unspecified: Secondary | ICD-10-CM

## 2019-01-04 DIAGNOSIS — E538 Deficiency of other specified B group vitamins: Secondary | ICD-10-CM

## 2019-01-04 DIAGNOSIS — M542 Cervicalgia: Secondary | ICD-10-CM

## 2019-01-04 DIAGNOSIS — F259 Schizoaffective disorder, unspecified: Secondary | ICD-10-CM

## 2019-01-04 DIAGNOSIS — K219 Gastro-esophageal reflux disease without esophagitis: Secondary | ICD-10-CM | POA: Diagnosis not present

## 2019-01-04 MED ORDER — ALENDRONATE SODIUM 10 MG PO TABS: ORAL_TABLET | ORAL | 11 refills | Status: DC

## 2019-01-04 MED ORDER — MELOXICAM 7.5 MG PO TABS: 7.5000 mg | ORAL_TABLET | Freq: Every day | ORAL | 0 refills | Status: DC | PRN

## 2019-01-04 NOTE — Telephone Encounter (Signed)
Talked to patient's daughter and she reports patient "refuses to take this medication", I told her to talk to her mom again and tell her [name] advised that she restarts medication.

## 2019-01-04 NOTE — Telephone Encounter (Signed)
Please contact pt's daughter and let her know that I reviewed her mom's med list and it looks like she has not been taking fosamax for her osteoporosis. I would recommend that she restart.  I have sent refills to her pharmacy.

## 2019-01-04 NOTE — Progress Notes (Signed)
Virtual Visit via Video Note  I connected with Abigail Castillo on 01/04/19 at  8:20 AM EDT by a video enabled telemedicine application and verified that I am speaking with the correct person using two identifiers.  Location: Patient: home Provider: office   I discussed the limitations of evaluation and management by telemedicine and the availability of in person appointments. The patient expressed understanding and agreed to proceed.  History of Present Illness:   Patient is a 68 yr old female who presents today for follow up.  Reports a "crook in her neck" for 4-5 days.  Has neck stiffness  b12 deficiency- she reports that she has been taking b12 orally daily.   Schizoaffective disorder- continues to follow with Dr. Erling Cruz.   Allergic Rhinitis-reports no current symptoms.    GERD- maintained on dexilant. Reports symptoms are stable.   COPD-  Reports that some days are better than others. She is maintained on Stiolto Respimat, prn albuterol.   Osteopenia- maintained on fosamax and caltrate.    Past Medical History:  Diagnosis Date  . Anxiety   . Blood transfusion   . Blood transfusion without reported diagnosis   . Chronic mental illness   . COPD (chronic obstructive pulmonary disease) ([place])   . DEPRESSION 05/31/2009  . Emphysema of lung ([place])   . Esophageal stricture 03/14/2015  . History of home oxygen therapy   . History of pneumonia   . Osteoporosis   . Shortness of breath dyspnea   . Tobacco abuse    ongoing  . Vitamin B 12 deficiency   . Vitamin D deficiency      Social History   Socioeconomic History  . Marital status: Divorced    Spouse name: Not on file  . Number of children: 3  . Years of education: Not on file  . Highest education level: Not on file  Occupational History  . Occupation: disabled    Fish farm manager: UNEMPLOYED  Social Needs  . Financial resource strain: Not on file  . Food insecurity    Worry: Not on file    Inability: Not on file  .  Transportation needs    Medical: Not on file    Non-medical: Not on file  Tobacco Use  . Smoking status: Former Smoker    Types: Cigarettes    Quit date: 08/12/2011    Years since quitting: 7.4  . Smokeless tobacco: Never Used  Substance and Sexual Activity  . Alcohol use: No    Alcohol/week: 0.0 standard drinks  . Drug use: No  . Sexual activity: Never  Lifestyle  . Physical activity    Days per week: Not on file    Minutes per session: Not on file  . Stress: Not on file  Relationships  . Social Herbalist on phone: Not on file    Gets together: Not on file    Attends religious service: Not on file    Active member of club or organization: Not on file    Attends meetings of clubs or organizations: Not on file    Relationship status: Not on file  . Intimate partner violence    Fear of current or ex partner: Not on file    Emotionally abused: Not on file    Physically abused: Not on file    Forced sexual activity: Not on file  Other Topics Concern  . Not on file  Social History Narrative   Lost a son to Lung CA 1/18  Lives with a son in [place]   Has a daughter who lives locally    Past Surgical History:  Procedure Laterality Date  . ABDOMINAL HYSTERECTOMY    . BREAST EXCISIONAL BIOPSY Left 2017  . BREAST LUMPECTOMY  1972   left breast  . BREAST LUMPECTOMY WITH RADIOACTIVE SEED LOCALIZATION Left 11/22/2015   Procedure: LEFT BREAST LUMPECTOMY WITH RADIOACTIVE SEED LOCALIZATION;  Surgeon: Erroll Luna, MD;  Location: [facility];  Service: General;  Laterality: Left;  . BREAST SURGERY  ?2015   lumpectomy, left  . CERVICAL CONE BIOPSY    . CHOLECYSTECTOMY    . COLONOSCOPY    . ESOPHAGOGASTRODUODENOSCOPY (EGD) WITH ESOPHAGEAL DILATION    . UPPER GASTROINTESTINAL ENDOSCOPY      Family History  Problem Relation Age of Onset  . Coronary artery disease Mother   . Diabetes Mother   . Hypertension Mother   . Coronary artery disease Father   . Diabetes  Father        Grandfather  . Hypertension Father   . Stroke Father   . Other Father        colostomy bag  . Breast cancer Sister   . Cancer Other        niece--breast  . Diabetes Sister   . Hypertension Sister   . Mental illness Sister        x 4  . Esophageal cancer Neg Hx   . Stomach cancer Neg Hx   . Rectal cancer Neg Hx   . Colon cancer Neg Hx   . Colon polyps Neg Hx     Allergies  Allergen Reactions  . Sulfonamide Derivatives Hives    Current Outpatient Medications on File Prior to Visit  Medication Sig Dispense Refill  . alendronate (FOSAMAX) 10 MG tablet Take 1 tablet (10 mg total) by mouth daily before breakfast. Take with a full glass of water on an empty stomach. 30 tablet 11  . ALPRAZolam (XANAX) 0.25 MG tablet TAKE ONE TABLET BY MOUTH DAILY AS NEEDED 30 tablet 0  . Calcium Carbonate-Vitamin D (CALTRATE 600+D) 600-400 MG-UNIT per tablet Take 1 tablet by mouth 2 (two) times daily.    . cholecalciferol (VITAMIN D) 1000 UNITS tablet Take 3,000 Units by mouth daily.     . citalopram (CELEXA) 20 MG tablet TAKE 1 TABLET BY MOUTH ONCE DAILY 30 tablet 3  . Cyanocobalamin (B-12) 1000 MCG SUBL Place 1 tablet under the tongue daily. 30 each 5  . DEXILANT 60 MG capsule TAKE 1 CAPSULE (60 MG TOTAL) BY MOUTH DAILY. 30 capsule 2  . LORazepam (ATIVAN) 1 MG tablet Take 1 mg by mouth 2 (two) times daily as needed.    Marland Kitchen OLANZapine (ZYPREXA) 5 MG tablet 7.5 mg.     . OXYGEN Inhale 2 L into the lungs.     Marland Kitchen Spacer/Aero-Holding Chambers (AEROCHAMBER MV) inhaler Use as instructed 1 each 0  . STIOLTO RESPIMAT 2.5-2.5 MCG/ACT AERS INHALE TWO PUFFS INTO THE LUNGS DAILY 1 Inhaler 0  . Tiotropium Bromide-Olodaterol (STIOLTO RESPIMAT) 2.5-2.5 MCG/ACT AERS Inhale 2 puffs into the lungs daily. 1 Inhaler 5  . VENTOLIN HFA 108 (90 Base) MCG/ACT inhaler INHALE TWO PUFFS INTO THE LUNGS EVERY 6 HOURS AS NEEDED FOR WHEEZING OR SHORTNESS OF BREATH. 18 Inhaler 3   No current facility-administered  medications on file prior to visit.     There were no vitals taken for this visit.    Observations/Objective:   Gen: Awake, alert, no acute  distress Resp: Breathing is even and non-labored Psych: calm/pleasant demeanor Neuro: Alert and Oriented x 3, + facial symmetry, speech is clear.   Assessment and Plan:  Neck pain-will give short course of meloxicam as needed.  Schizoaffective disorder-clinically stable.  Management per psychiatry.  GERD-stable on Dexilant continue same.  Osteoporosis-it appears that she has not refilled Fosamax recently.  Will resume.  See phone note.  B12 deficiency-continue oral B12 for now until we can safely bring her into the office in the setting of COVID-19 pandemic.  COPD- clinically stable on current medications.  Continue same.  Allergic rhinitis-stable without medication.  Monitor.    Follow Up Instructions:    I discussed the assessment and treatment plan with the patient. The patient was provided an opportunity to ask questions and all were answered. The patient agreed with the plan and demonstrated an understanding of the instructions.   The patient was advised to call back or seek an in-person evaluation if the symptoms worsen or if the condition fails to improve as anticipated.  Nance Pear, NP

## 2019-01-05 ENCOUNTER — Other Ambulatory Visit: Payer: Self-pay

## 2019-01-05 ENCOUNTER — Ambulatory Visit
Admission: RE | Admit: 2019-01-05 | Discharge: 2019-01-05 | Disposition: A | Discharge status: Home / Self Care | Payer: Medicare Other | Source: Ambulatory Visit | Attending: Family | Admitting: Family

## 2019-01-05 DIAGNOSIS — Z1231 Encounter for screening mammogram for malignant neoplasm of breast: Secondary | ICD-10-CM | POA: Diagnosis not present

## 2019-01-06 ENCOUNTER — Other Ambulatory Visit: Payer: Self-pay | Admitting: Family

## 2019-01-24 NOTE — Progress Notes (Signed)
Virtual Visit via Video Note  I connected with patient on 01/25/19 at  8:00 AM EDT by audio enabled telemedicine application and verified that I am speaking with the correct person using two identifiers.   THIS ENCOUNTER IS A VIRTUAL VISIT DUE TO COVID-19 - PATIENT WAS NOT SEEN IN THE OFFICE. PATIENT HAS CONSENTED TO VIRTUAL VISIT / TELEMEDICINE VISIT   Location of patient: home  Location of provider: office  I discussed the limitations of evaluation and management by telemedicine and the availability of in person appointments. The patient expressed understanding and agreed to proceed.   Subjective:   Abigail Castillo is a 68 y.o. female who presents for Medicare Annual (Subsequent) preventive examination.  Review of Systems:  Cardiac Risk Factors include: advanced age (>22men, >34 women)  Home Safety/Smoke Alarms: Feels safe in home. Smoke alarms in place.  Living environment; residence and Firearm Safety: Lives with son. Dtr lives very close. No stairs. Step over tub. Wears O2 @3L .   Female:     Mammo- 01/05/19       Dexa scan- 03/18/17 [facility]- declines    Objective:     Vitals: Unable to assess. This visit is enabled though telemedicine due to Covid 19.   Advanced Directives 01/25/2019 01/22/2018 12/11/2016 11/22/2015 11/13/2015 08/10/2015 04/16/2015  Does Patient Have a Medical Advance Directive? No No No No No No No  Would patient like information on creating a medical advance directive? No - Patient declined Yes (MAU/Ambulatory/Procedural Areas - Information given) Yes (MAU/Ambulatory/Procedural Areas - Information given) No - patient declined information No - patient declined information No - patient declined information -    Tobacco Social History   Tobacco Use  Smoking Status Former Smoker  . Types: Cigarettes  . Quit date: 08/12/2011  . Years since quitting: 7.4  Smokeless Tobacco Never Used     Counseling given: Not Answered   Clinical Intake: Pain : No/denies pain    Past Medical History:  Diagnosis Date  . Anxiety   . Blood transfusion   . Blood transfusion without reported diagnosis   . Chronic mental illness   . COPD (chronic obstructive pulmonary disease) ([place])   . DEPRESSION 05/31/2009  . Emphysema of lung ([place])   . Esophageal stricture 03/14/2015  . History of home oxygen therapy   . History of pneumonia   . Osteoporosis   . Shortness of breath dyspnea   . Tobacco abuse    ongoing  . Vitamin B 12 deficiency   . Vitamin D deficiency    Past Surgical History:  Procedure Laterality Date  . ABDOMINAL HYSTERECTOMY    . BREAST EXCISIONAL BIOPSY Left 2017  . BREAST LUMPECTOMY  1972   left breast  . BREAST LUMPECTOMY WITH RADIOACTIVE SEED LOCALIZATION Left 11/22/2015   Procedure: LEFT BREAST LUMPECTOMY WITH RADIOACTIVE SEED LOCALIZATION;  Surgeon: Erroll Luna, MD;  Location: [facility];  Service: General;  Laterality: Left;  . BREAST SURGERY  ?2015   lumpectomy, left  . CERVICAL CONE BIOPSY    . CHOLECYSTECTOMY    . COLONOSCOPY    . ESOPHAGOGASTRODUODENOSCOPY (EGD) WITH ESOPHAGEAL DILATION    . UPPER GASTROINTESTINAL ENDOSCOPY     Family History  Problem Relation Age of Onset  . Coronary artery disease Mother   . Diabetes Mother   . Hypertension Mother   . Coronary artery disease Father   . Diabetes Father        Grandfather  . Hypertension Father   . Stroke Father   .  Other Father        colostomy bag  . Breast cancer Sister   . Cancer Other        niece--breast  . Diabetes Sister   . Hypertension Sister   . Mental illness Sister        x 4  . Esophageal cancer Neg Hx   . Stomach cancer Neg Hx   . Rectal cancer Neg Hx   . Colon cancer Neg Hx   . Colon polyps Neg Hx    Social History   Socioeconomic History  . Marital status: Divorced    Spouse name: Not on file  . Number of children: 3  . Years of education: Not on file  . Highest education level: Not on file  Occupational History  . Occupation: disabled     Fish farm manager: UNEMPLOYED  Social Needs  . Financial resource strain: Not on file  . Food insecurity    Worry: Not on file    Inability: Not on file  . Transportation needs    Medical: Not on file    Non-medical: Not on file  Tobacco Use  . Smoking status: Former Smoker    Types: Cigarettes    Quit date: 08/12/2011    Years since quitting: 7.4  . Smokeless tobacco: Never Used  Substance and Sexual Activity  . Alcohol use: No    Alcohol/week: 0.0 standard drinks  . Drug use: No  . Sexual activity: Never  Lifestyle  . Physical activity    Days per week: Not on file    Minutes per session: Not on file  . Stress: Not on file  Relationships  . Social Herbalist on phone: Not on file    Gets together: Not on file    Attends religious service: Not on file    Active member of club or organization: Not on file    Attends meetings of clubs or organizations: Not on file    Relationship status: Not on file  Other Topics Concern  . Not on file  Social History Narrative   Lost a son to Lung CA 1/18   Lives with a son in [place]   Has a daughter who lives locally    Outpatient Encounter Medications as of 01/25/2019  Medication Sig  . Calcium Carbonate-Vitamin D (CALTRATE 600+D) 600-400 MG-UNIT per tablet Take 1 tablet by mouth 2 (two) times daily.  . cholecalciferol (VITAMIN D) 1000 UNITS tablet Take 3,000 Units by mouth daily.   . Cyanocobalamin (B-12) 1000 MCG SUBL Place 1 tablet under the tongue daily.  Marland Kitchen DEXILANT 60 MG capsule TAKE 1 CAPSULE (60 MG TOTAL) BY MOUTH DAILY.  Marland Kitchen LORazepam (ATIVAN) 1 MG tablet Take 1 mg by mouth 2 (two) times daily as needed.  Marland Kitchen OLANZapine (ZYPREXA) 5 MG tablet 7.5 mg.   . OXYGEN Inhale 2 L into the lungs.   Marland Kitchen Spacer/Aero-Holding Chambers (AEROCHAMBER MV) inhaler Use as instructed  . STIOLTO RESPIMAT 2.5-2.5 MCG/ACT AERS INHALE TWO PUFFS INTO THE LUNGS DAILY  . VENTOLIN HFA 108 (90 Base) MCG/ACT inhaler INHALE TWO PUFFS INTO THE LUNGS EVERY  6 HOURS AS NEEDED FOR WHEEZING OR SHORTNESS OF BREATH.  Marland Kitchen alendronate (FOSAMAX) 10 MG tablet Take with a full glass of water on empty stomach and sit upright for 90 minutes after taking (Patient not taking: Reported on 01/25/2019)  . meloxicam (MOBIC) 7.5 MG tablet Take 1 tablet (7.5 mg total) by mouth daily as needed (neck pain). (  Patient not taking: Reported on 01/25/2019)   No facility-administered encounter medications on file as of 01/25/2019.     Activities of Daily Living In your present state of health, do you have any difficulty performing the following activities: 01/25/2019  Hearing? N  Vision? N  Difficulty concentrating or making decisions? N  Walking or climbing stairs? N  Dressing or bathing? N  Doing errands, shopping? Y  Preparing Food and eating ? N  Using the Toilet? N  In the past six months, have you accidently leaked urine? N  Do you have problems with loss of bowel control? N  Managing your Medications? Y  Managing your Finances? Y  Comment daughter  Housekeeping or managing your Housekeeping? N  Some recent data might be hidden    Patient Care Team: [name], NP as PCP - General (Internal Medicine) Gatha Mayer, MD as Consulting Physician (Gastroenterology) Lendon Colonel, MD as Referring Physician (Psychiatry)    Assessment:   This is a routine wellness examination for Abigail Castillo. Physical assessment deferred to PCP.  Exercise Activities and Dietary recommendations Current Exercise Habits: The patient does not participate in regular exercise at present, Exercise limited by: respiratory conditions(s) Diet (meal preparation, eat out, water intake, caffeinated beverages, dairy products, fruits and vegetables): well balanced               Goals    . Increase physical activity     As tolerated.       Fall Risk Fall Risk  01/25/2019 01/22/2018 12/11/2016 07/13/2015 07/13/2015  Falls in the past year? 0 No No No No     Depression Screen PHQ 2/9 Scores  01/25/2019 01/22/2018 11/11/2017 12/11/2016  PHQ - 2 Score 0 0 2 0  PHQ- 9 Score - - 8 -     Cognitive Function MMSE - Mini Mental State Exam 01/22/2018 12/11/2016  Orientation to time 4 5  Orientation to Place 5 5  Registration 3 3  Attention/ Calculation 5 5  Recall 2 2  Language- name 2 objects 2 2  Language- repeat 1 1  Language- follow 3 step command 3 3  Language- read & follow direction 1 1  Write a sentence 1 1  Copy design 1 1  Total score 28 29     6CIT Screen 01/25/2019  What Year? 0 points  What month? 0 points  What time? 0 points  Count back from 20 0 points  Months in reverse 4 points  Repeat phrase 4 points  Total Score 8    Immunization History  Administered Date(s) Administered  . Influenza Whole 03/02/2008, 04/09/2010  . Influenza, High Dose Seasonal PF 03/13/2017  . Influenza, Seasonal, Injecte, Preservative Fre 06/18/2012  . Influenza,inj,Quad PF,6+ Mos 02/01/2013, 02/03/2014, 02/05/2015, 03/10/2016  . PPD Test 12/17/2010  . Pneumococcal Conjugate-13 11/11/2017  . Pneumococcal Polysaccharide-23 03/02/2008, 02/01/2013  . Tdap 05/26/2008    Screening Tests Health Maintenance  Topic Date Due  . Hepatitis C Screening  1950/09/29  . COLONOSCOPY  02/11/2017  . TETANUS/TDAP  05/26/2018  . PNA vac Low Risk Adult (2 of 2 - PPSV23) 11/12/2018  . INFLUENZA VACCINE  12/25/2018  . MAMMOGRAM  01/05/2020  . DEXA SCAN  Completed      Plan:   See you next year!  Continue to eat heart healthy diet (full of fruits, vegetables, whole grains, lean protein, water--limit salt, fat, and sugar intake) and increase physical activity as tolerated.  Continue doing brain stimulating activities (puzzles, reading, adult  coloring books, staying active) to keep memory sharp.     I have personally reviewed and noted the following in the patient's chart:   . Medical and social history . Use of alcohol, tobacco or illicit drugs  . Current medications and supplements .  Functional ability and status . Nutritional status . Physical activity . Advanced directives . List of other physicians . Hospitalizations, surgeries, and ER visits in previous 12 months . Vitals . Screenings to include cognitive, depression, and falls . Referrals and appointments  In addition, I have reviewed and discussed with patient certain preventive protocols, quality metrics, and best practice recommendations. A written personalized care plan for preventive services as well as general preventive health recommendations were provided to patient.     Shela Nevin, [place]  01/25/2019

## 2019-01-25 ENCOUNTER — Encounter: Payer: Self-pay | Admitting: *Deleted

## 2019-01-25 ENCOUNTER — Ambulatory Visit (INDEPENDENT_AMBULATORY_CARE_PROVIDER_SITE_OTHER): Payer: Medicare Other | Admitting: *Deleted

## 2019-01-25 ENCOUNTER — Other Ambulatory Visit: Payer: Self-pay

## 2019-01-25 DIAGNOSIS — Z0000 Encounter for general adult medical examination without abnormal findings: Secondary | ICD-10-CM

## 2019-01-25 NOTE — Patient Instructions (Signed)
See you next year!  Continue to eat heart healthy diet (full of fruits, vegetables, whole grains, lean protein, water--limit salt, fat, and sugar intake) and increase physical activity as tolerated.  Continue doing brain stimulating activities (puzzles, reading, adult coloring books, staying active) to keep memory sharp.     Abigail Castillo , Thank you for taking time to come for your Medicare Wellness Visit. I appreciate your ongoing commitment to your health goals. Please review the following plan we discussed and let me know if I can assist you in the future.   These are the goals we discussed: Goals    . Increase physical activity     As tolerated.       This is a list of the screening recommended for you and due dates:  Health Maintenance  Topic Date Due  .  Hepatitis C: One time screening is recommended by Center for Disease Control  (CDC) for  adults born from [birth year] through 1964.   [birth year]  . Colon Cancer Screening  02/11/2017  . Tetanus Vaccine  05/26/2018  . Pneumonia vaccines (2 of 2 - PPSV23) 11/12/2018  . Flu Shot  12/25/2018  . Mammogram  01/05/2020  . DEXA scan (bone density measurement)  Completed    Health Maintenance After Age 66 After age 19, you are at a higher risk for certain long-term diseases and infections as well as injuries from falls. Falls are a major cause of broken bones and head injuries in people who are older than age 74. Getting regular preventive care can help to keep you healthy and well. Preventive care includes getting regular testing and making lifestyle changes as recommended by your health care provider. Talk with your health care provider about:  Which screenings and tests you should have. A screening is a test that checks for a disease when you have no symptoms.  A diet and exercise plan that is right for you. What should I know about screenings and tests to prevent falls? Screening and testing are the best ways to find a health problem  early. Early diagnosis and treatment give you the best chance of managing medical conditions that are common after age 38. Certain conditions and lifestyle choices may make you more likely to have a fall. Your health care provider may recommend:  Regular vision checks. Poor vision and conditions such as cataracts can make you more likely to have a fall. If you wear glasses, make sure to get your prescription updated if your vision changes.  Medicine review. Work with your health care provider to regularly review all of the medicines you are taking, including over-the-counter medicines. Ask your health care provider about any side effects that may make you more likely to have a fall. Tell your health care provider if any medicines that you take make you feel dizzy or sleepy.  Osteoporosis screening. Osteoporosis is a condition that causes the bones to get weaker. This can make the bones weak and cause them to break more easily.  Blood pressure screening. Blood pressure changes and medicines to control blood pressure can make you feel dizzy.  Strength and balance checks. Your health care provider may recommend certain tests to check your strength and balance while standing, walking, or changing positions.  Foot health exam. Foot pain and numbness, as well as not wearing proper footwear, can make you more likely to have a fall.  Depression screening. You may be more likely to have a fall if you have a fear  of falling, feel emotionally low, or feel unable to do activities that you used to do.  Alcohol use screening. Using too much alcohol can affect your balance and may make you more likely to have a fall. What actions can I take to lower my risk of falls? General instructions  Talk with your health care provider about your risks for falling. Tell your health care provider if: ? You fall. Be sure to tell your health care provider about all falls, even ones that seem minor. ? You feel dizzy, sleepy,  or off-balance.  Take over-the-counter and prescription medicines only as told by your health care provider. These include any supplements.  Eat a healthy diet and maintain a healthy weight. A healthy diet includes low-fat dairy products, low-fat (lean) meats, and fiber from whole grains, beans, and lots of fruits and vegetables. Home safety  Remove any tripping hazards, such as rugs, cords, and clutter.  Install safety equipment such as grab bars in bathrooms and safety rails on stairs.  Keep rooms and walkways well-lit. Activity   Follow a regular exercise program to stay fit. This will help you maintain your balance. Ask your health care provider what types of exercise are appropriate for you.  If you need a cane or walker, use it as recommended by your health care provider.  Wear supportive shoes that have nonskid soles. Lifestyle  Do not drink alcohol if your health care provider tells you not to drink.  If you drink alcohol, limit how much you have: ? 0-1 drink a day for women. ? 0-2 drinks a day for men.  Be aware of how much alcohol is in your drink. In the [place] one drink equals one typical bottle of beer (12 oz), one-half glass of wine (5 oz), or one shot of hard liquor (1 oz).  Do not use any products that contain nicotine or tobacco, such as cigarettes and e-cigarettes. If you need help quitting, ask your health care provider. Summary  Having a healthy lifestyle and getting preventive care can help to protect your health and wellness after age 59.  Screening and testing are the best way to find a health problem early and help you avoid having a fall. Early diagnosis and treatment give you the best chance for managing medical conditions that are more common for people who are older than age 17.  Falls are a major cause of broken bones and head injuries in people who are older than age 34. Take precautions to prevent a fall at home.  Work with your health care  provider to learn what changes you can make to improve your health and wellness and to prevent falls. This information is not intended to replace advice given to you by your health care provider. Make sure you discuss any questions you have with your health care provider. Document Released: 03/25/2017 Document Revised: 09/02/2018 Document Reviewed: 03/25/2017 Elsevier Patient Education  2020 [employer].

## 2019-03-03 DIAGNOSIS — F251 Schizoaffective disorder, depressive type: Secondary | ICD-10-CM | POA: Diagnosis not present

## 2019-03-21 ENCOUNTER — Other Ambulatory Visit: Payer: Self-pay | Admitting: Family

## 2019-05-18 ENCOUNTER — Other Ambulatory Visit: Payer: Self-pay | Admitting: Family

## 2019-06-06 ENCOUNTER — Telehealth: Payer: Self-pay | Admitting: Emergency Medicine

## 2019-06-06 NOTE — Telephone Encounter (Signed)
Called and spoke with pt letting her know that she will need to have an OV prior to [place] able to place order for new O2 supplies and pt verbalized understanding.  Pt has been scheduled an OV with RB Wed. 1/13. Nothing further needed.

## 2019-06-08 ENCOUNTER — Ambulatory Visit (INDEPENDENT_AMBULATORY_CARE_PROVIDER_SITE_OTHER): Payer: Medicare Other | Admitting: Emergency Medicine

## 2019-06-08 ENCOUNTER — Other Ambulatory Visit: Payer: Self-pay

## 2019-06-08 ENCOUNTER — Encounter: Payer: Self-pay | Admitting: Emergency Medicine

## 2019-06-08 ENCOUNTER — Other Ambulatory Visit: Payer: Self-pay | Admitting: Family

## 2019-06-08 DIAGNOSIS — J449 Chronic obstructive pulmonary disease, unspecified: Secondary | ICD-10-CM

## 2019-06-08 DIAGNOSIS — J9611 Chronic respiratory failure with hypoxia: Secondary | ICD-10-CM

## 2019-06-08 DIAGNOSIS — J309 Allergic rhinitis, unspecified: Secondary | ICD-10-CM

## 2019-06-08 NOTE — Progress Notes (Signed)
Subjective:    Patient ID: Abigail Castillo, female    DOB: 08-11-1950, 69 y.o.   MRN: [id number]  COPD She complains of shortness of breath. There is no cough or wheezing. Pertinent negatives include no ear pain, fever, headaches, postnasal drip, rhinorrhea, sneezing, sore throat or trouble swallowing. Her past medical history is significant for COPD.    [name] 06/08/19 --69 year old woman who follows up today for severe COPD and associated exertional hypoxemia on 2 L/min..  She also has intermittent nasal drainage.  Currently managed on Stiolto.  Uses albuterol 1-3x a day.  Has not had any hospital visits, exacerbations or prednisone. She has occasional cough, mucous. Thick clear. She is able to get around but has to pace herself. Some difficulty with her O2 hose limiting her. Nasal congestion in the am, interested in [place]'s. She is interested in a POC. Flu shot is up to date.    Review of Systems  Constitutional: Negative for fever and unexpected weight change.  HENT: Negative for congestion, dental problem, ear pain, nosebleeds, postnasal drip, rhinorrhea, sinus pressure, sneezing, sore throat and trouble swallowing.   Eyes: Negative for redness and itching.  Respiratory: Positive for shortness of breath. Negative for cough, chest tightness and wheezing.   Cardiovascular: Negative for palpitations and leg swelling.  Gastrointestinal: Negative for nausea and vomiting.  Genitourinary: Negative for dysuria.  Musculoskeletal: Negative for joint swelling.  Skin: Negative for rash.  Neurological: Negative for headaches.  Hematological: Does not bruise/bleed easily.  Psychiatric/Behavioral: Negative for dysphoric mood. The patient is not nervous/anxious.        Objective:   Physical Exam  Vitals:   06/08/19 1359  BP: 94/64  Pulse: (!) 121  Temp: 98 F (36.7 C)  TempSrc: Temporal  SpO2: 95%  Weight: 152 lb 3.2 oz (69 kg)  Height: 5\' 2"  (1.575 m)   Gen: Pleasant, well-nourished, in no  distress  ENT: No lesions,  Edentulous, mouth clear,  oropharynx clear, no postnasal drip  Neck: No JVD, no stridor  Lungs: No use of accessory muscles, distant,   Cardiovascular: RRR, heart sounds normal, no murmur or gallops, no peripheral edema  Musculoskeletal: No deformities, no cyanosis or clubbing  Neuro: alert, non focal, oriented   Skin: Warm, no lesions or rashes       Assessment & Plan:  COPD (chronic obstructive pulmonary disease) (HCC) Overall stable.  She does have some increased work of breathing with exertion.  Unclear whether she is having desaturations.  I talked to her about getting a pulse oximeter so that we can correlate.   Please continue your Stiolto 2 puffs once daily. Keep your albuterol available to use 2 puffs up to every 4 hours if needed for shortness of breath, chest tightness, wheezing.  Use a spacer when we take this.  We will review the technique for this today. Flu shot up-to-date. You would benefit from getting the COVID-19 vaccine.  Consider doing this. Follow with Dr Lamonte Sakai in 6 months or sooner if you have any problems  Allergic rhinitis Try doing nasal saline rinses once a day.  Chronic respiratory failure ([place]) We will work on getting you a portable oxygen concentrator.  You will need to use 2 to 3 L/min.  There is a shortage and a waiting list for this right now, but we will work on getting on the list so that we can obtain this for you as soon as possible.   Baltazar Apo, MD, PhD 06/08/2019, 2:25 PM  [facility] [phone] or if no answer [phone]

## 2019-06-08 NOTE — Assessment & Plan Note (Signed)
Overall stable.  She does have some increased work of breathing with exertion.  Unclear whether she is having desaturations.  I talked to her about getting a pulse oximeter so that we can correlate.   Please continue your Stiolto 2 puffs once daily. Keep your albuterol available to use 2 puffs up to every 4 hours if needed for shortness of breath, chest tightness, wheezing.  Use a spacer when we take this.  We will review the technique for this today. Flu shot up-to-date. You would benefit from getting the COVID-19 vaccine.  Consider doing this. Follow with Dr Lamonte Sakai in 6 months or sooner if you have any problems

## 2019-06-08 NOTE — Patient Instructions (Signed)
We will work on getting you a portable oxygen concentrator.  You will need to use 2 to 3 L/min.  There is a shortage and a waiting list for this right now, but we will work on getting on the list so that we can obtain this for you as soon as possible. Please continue your Stiolto 2 puffs once daily. Keep your albuterol available to use 2 puffs up to every 4 hours if needed for shortness of breath, chest tightness, wheezing.  Use a spacer when we take this.  We will review the technique for this today. Try doing nasal saline rinses once a day. Flu shot up-to-date. You would benefit from getting the COVID-19 vaccine.  Consider doing this. Follow with Dr Lamonte Sakai in 6 months or sooner if you have any problems

## 2019-06-08 NOTE — Assessment & Plan Note (Signed)
Try doing nasal saline rinses once a day.

## 2019-06-08 NOTE — Assessment & Plan Note (Signed)
We will work on getting you a portable oxygen concentrator.  You will need to use 2 to 3 L/min.  There is a shortage and a waiting list for this right now, but we will work on getting on the list so that we can obtain this for you as soon as possible.

## 2019-06-24 ENCOUNTER — Telehealth: Payer: Self-pay | Admitting: Emergency Medicine

## 2019-06-24 NOTE — Telephone Encounter (Signed)
I am happy to complete the form for her if this is allowed through the [facility].  I do not know for sure whether they will give her more than one placard to use.

## 2019-06-24 NOTE — Telephone Encounter (Signed)
Spoke with pt, she is requesting 2 handicap placards because she uses one for traveling. Can we sign two forms for pt. Please advise RB

## 2019-06-27 NOTE — Telephone Encounter (Signed)
LMOM TCB x1  Have we already filled out an application for her?  If not, there is a place on the application for patient to check that they need 2 placards.  If we have already filled out an application and she needs another placard off of that, she might want to call the DMV to see if this is allowed.

## 2019-06-28 NOTE — Telephone Encounter (Signed)
LMTCB x2 for pt 

## 2019-06-29 NOTE — Telephone Encounter (Signed)
LMTCB x3 for pt. We have attempted to contact pt several times with no success or call back from pt. Per triage protocol, message will be closed.   

## 2019-07-05 ENCOUNTER — Emergency Department (HOSPITAL_COMMUNITY): Payer: Medicare Other

## 2019-07-05 ENCOUNTER — Encounter (HOSPITAL_COMMUNITY): Payer: Self-pay | Admitting: Emergency Medicine

## 2019-07-05 ENCOUNTER — Other Ambulatory Visit: Payer: Self-pay

## 2019-07-05 ENCOUNTER — Inpatient Hospital Stay (HOSPITAL_COMMUNITY)
Admission: EM | Admit: 2019-07-05 | Discharge: 2019-07-12 | DRG: 177 | Disposition: A | Discharge status: Home Health | Payer: Medicare Other | Attending: Family Medicine | Admitting: Family Medicine

## 2019-07-05 ENCOUNTER — Ambulatory Visit (INDEPENDENT_AMBULATORY_CARE_PROVIDER_SITE_OTHER): Payer: Medicare Other | Admitting: Emergency Medicine

## 2019-07-05 ENCOUNTER — Encounter: Payer: Self-pay | Admitting: Emergency Medicine

## 2019-07-05 DIAGNOSIS — Z791 Long term (current) use of non-steroidal anti-inflammatories (NSAID): Secondary | ICD-10-CM

## 2019-07-05 DIAGNOSIS — Z87891 Personal history of nicotine dependence: Secondary | ICD-10-CM | POA: Diagnosis not present

## 2019-07-05 DIAGNOSIS — Z90710 Acquired absence of both cervix and uterus: Secondary | ICD-10-CM

## 2019-07-05 DIAGNOSIS — J1282 Pneumonia due to coronavirus disease 2019: Secondary | ICD-10-CM | POA: Diagnosis present

## 2019-07-05 DIAGNOSIS — E781 Pure hyperglyceridemia: Secondary | ICD-10-CM | POA: Diagnosis present

## 2019-07-05 DIAGNOSIS — U071 COVID-19: Secondary | ICD-10-CM | POA: Diagnosis present

## 2019-07-05 DIAGNOSIS — K219 Gastro-esophageal reflux disease without esophagitis: Secondary | ICD-10-CM | POA: Diagnosis present

## 2019-07-05 DIAGNOSIS — Z882 Allergy status to sulfonamides status: Secondary | ICD-10-CM

## 2019-07-05 DIAGNOSIS — F259 Schizoaffective disorder, unspecified: Secondary | ICD-10-CM | POA: Diagnosis not present

## 2019-07-05 DIAGNOSIS — J9621 Acute and chronic respiratory failure with hypoxia: Secondary | ICD-10-CM

## 2019-07-05 DIAGNOSIS — J449 Chronic obstructive pulmonary disease, unspecified: Secondary | ICD-10-CM | POA: Diagnosis present

## 2019-07-05 DIAGNOSIS — Z803 Family history of malignant neoplasm of breast: Secondary | ICD-10-CM

## 2019-07-05 DIAGNOSIS — E559 Vitamin D deficiency, unspecified: Secondary | ICD-10-CM | POA: Diagnosis present

## 2019-07-05 DIAGNOSIS — E785 Hyperlipidemia, unspecified: Secondary | ICD-10-CM | POA: Diagnosis present

## 2019-07-05 DIAGNOSIS — Z833 Family history of diabetes mellitus: Secondary | ICD-10-CM

## 2019-07-05 DIAGNOSIS — Z823 Family history of stroke: Secondary | ICD-10-CM | POA: Diagnosis not present

## 2019-07-05 DIAGNOSIS — M810 Age-related osteoporosis without current pathological fracture: Secondary | ICD-10-CM | POA: Diagnosis present

## 2019-07-05 DIAGNOSIS — Z8701 Personal history of pneumonia (recurrent): Secondary | ICD-10-CM | POA: Diagnosis not present

## 2019-07-05 DIAGNOSIS — F329 Major depressive disorder, single episode, unspecified: Secondary | ICD-10-CM | POA: Diagnosis present

## 2019-07-05 DIAGNOSIS — J9600 Acute respiratory failure, unspecified whether with hypoxia or hypercapnia: Secondary | ICD-10-CM | POA: Diagnosis not present

## 2019-07-05 DIAGNOSIS — R0602 Shortness of breath: Secondary | ICD-10-CM | POA: Diagnosis not present

## 2019-07-05 DIAGNOSIS — Z79899 Other long term (current) drug therapy: Secondary | ICD-10-CM | POA: Diagnosis not present

## 2019-07-05 DIAGNOSIS — Z86010 Personal history of colonic polyps: Secondary | ICD-10-CM | POA: Diagnosis not present

## 2019-07-05 DIAGNOSIS — R000 Tachycardia, unspecified: Secondary | ICD-10-CM | POA: Diagnosis not present

## 2019-07-05 DIAGNOSIS — Z9049 Acquired absence of other specified parts of digestive tract: Secondary | ICD-10-CM

## 2019-07-05 DIAGNOSIS — J441 Chronic obstructive pulmonary disease with (acute) exacerbation: Secondary | ICD-10-CM | POA: Diagnosis not present

## 2019-07-05 DIAGNOSIS — J439 Emphysema, unspecified: Secondary | ICD-10-CM | POA: Diagnosis present

## 2019-07-05 DIAGNOSIS — F32A Depression, unspecified: Secondary | ICD-10-CM | POA: Diagnosis present

## 2019-07-05 DIAGNOSIS — E538 Deficiency of other specified B group vitamins: Secondary | ICD-10-CM | POA: Diagnosis present

## 2019-07-05 DIAGNOSIS — Z9981 Dependence on supplemental oxygen: Secondary | ICD-10-CM

## 2019-07-05 DIAGNOSIS — R5381 Other malaise: Secondary | ICD-10-CM | POA: Diagnosis not present

## 2019-07-05 DIAGNOSIS — Z743 Need for continuous supervision: Secondary | ICD-10-CM | POA: Diagnosis not present

## 2019-07-05 DIAGNOSIS — R279 Unspecified lack of coordination: Secondary | ICD-10-CM | POA: Diagnosis not present

## 2019-07-05 DIAGNOSIS — Z818 Family history of other mental and behavioral disorders: Secondary | ICD-10-CM | POA: Diagnosis not present

## 2019-07-05 DIAGNOSIS — Z8249 Family history of ischemic heart disease and other diseases of the circulatory system: Secondary | ICD-10-CM

## 2019-07-05 DIAGNOSIS — R531 Weakness: Secondary | ICD-10-CM | POA: Diagnosis not present

## 2019-07-05 HISTORY — DX: Acute and chronic respiratory failure with hypoxia: J96.21

## 2019-07-05 LAB — CBC WITH DIFFERENTIAL/PLATELET
Abs Immature Granulocytes: 0.03 10*3/uL (ref 0.00–0.07)
Basophils Absolute: 0 10*3/uL (ref 0.0–0.1)
Basophils Relative: 0 %
Eosinophils Absolute: 0 10*3/uL (ref 0.0–0.5)
Eosinophils Relative: 0 %
HCT: 48.6 % — ABNORMAL HIGH (ref 36.0–46.0)
Hemoglobin: 15.5 g/dL — ABNORMAL HIGH (ref 12.0–15.0)
Immature Granulocytes: 1 %
Lymphocytes Relative: 14 %
Lymphs Abs: 0.9 10*3/uL (ref 0.7–4.0)
MCH: 30.5 pg (ref 26.0–34.0)
MCHC: 31.9 g/dL (ref 30.0–36.0)
MCV: 95.5 fL (ref 80.0–100.0)
Monocytes Absolute: 0.7 10*3/uL (ref 0.1–1.0)
Monocytes Relative: 11 %
Neutro Abs: 4.6 10*3/uL (ref 1.7–7.7)
Neutrophils Relative %: 74 %
Platelets: 293 10*3/uL (ref 150–400)
RBC: 5.09 MIL/uL (ref 3.87–5.11)
RDW: 12.7 % (ref 11.5–15.5)
WBC: 6.2 10*3/uL (ref 4.0–10.5)
nRBC: 0 % (ref 0.0–0.2)

## 2019-07-05 LAB — COMPREHENSIVE METABOLIC PANEL
ALT: 22 U/L (ref 0–44)
AST: 24 U/L (ref 15–41)
Albumin: 3.8 g/dL (ref 3.5–5.0)
Alkaline Phosphatase: 90 U/L (ref 38–126)
Anion gap: 16 — ABNORMAL HIGH (ref 5–15)
BUN: 16 mg/dL (ref 8–23)
CO2: 23 mmol/L (ref 22–32)
Calcium: 9.2 mg/dL (ref 8.9–10.3)
Chloride: 102 mmol/L (ref 98–111)
Creatinine, Ser: 0.66 mg/dL (ref 0.44–1.00)
GFR calc Af Amer: >60 mL/min (ref >60)
GFR calc non Af Amer: >60 mL/min (ref >60)
Glucose, Bld: 133 mg/dL — ABNORMAL HIGH (ref 70–99)
Potassium: 3.6 mmol/L (ref 3.5–5.1)
Sodium: 141 mmol/L (ref 135–145)
Total Bilirubin: 2.2 mg/dL — ABNORMAL HIGH (ref 0.3–1.2)
Total Protein: 7.3 g/dL (ref 6.5–8.1)

## 2019-07-05 LAB — POC SARS CORONAVIRUS 2 AG -  ED: SARS Coronavirus 2 Ag: POSITIVE — AB

## 2019-07-05 LAB — LACTIC ACID, PLASMA
Lactic Acid, Venous: 1.6 mmol/L (ref 0.5–1.9)
Lactic Acid, Venous: 1.5 mmol/L (ref 0.5–1.9)

## 2019-07-05 LAB — FIBRINOGEN: Fibrinogen: 582 mg/dL — ABNORMAL HIGH (ref 210–475)

## 2019-07-05 LAB — PROCALCITONIN: Procalcitonin: <0.1 ng/mL

## 2019-07-05 LAB — D-DIMER, QUANTITATIVE: D-Dimer, Quant: 1.16 ug/mL-FEU — ABNORMAL HIGH (ref 0.00–0.50)

## 2019-07-05 LAB — FERRITIN: Ferritin: 88 ng/mL (ref 11–307)

## 2019-07-05 LAB — LACTATE DEHYDROGENASE: LDH: 190 U/L (ref 98–192)

## 2019-07-05 LAB — C-REACTIVE PROTEIN: CRP: 3.5 mg/dL — ABNORMAL HIGH (ref <1.0)

## 2019-07-05 LAB — TRIGLYCERIDES: Triglycerides: 94 mg/dL (ref <150)

## 2019-07-05 MED ORDER — METHYLPREDNISOLONE SODIUM SUCC 125 MG IJ SOLR
125.0000 mg | Freq: Once | INTRAMUSCULAR | Status: AC
  Administered 2019-07-05: 20:00:00 125 mg via INTRAVENOUS
  Filled 2019-07-05: qty 2

## 2019-07-05 MED ORDER — ALBUTEROL SULFATE HFA 108 (90 BASE) MCG/ACT IN AERS
8.0000 | INHALATION_SPRAY | Freq: Once | RESPIRATORY_TRACT | Status: AC
  Administered 2019-07-05: 8 via RESPIRATORY_TRACT

## 2019-07-05 MED ORDER — ALBUTEROL SULFATE HFA 108 (90 BASE) MCG/ACT IN AERS
8.0000 | INHALATION_SPRAY | Freq: Once | RESPIRATORY_TRACT | Status: AC
  Administered 2019-07-05: 8 via RESPIRATORY_TRACT
  Filled 2019-07-05: qty 6.7

## 2019-07-05 MED ORDER — IPRATROPIUM BROMIDE HFA 17 MCG/ACT IN AERS
2.0000 | INHALATION_SPRAY | RESPIRATORY_TRACT | Status: AC
  Administered 2019-07-05: 2 via RESPIRATORY_TRACT
  Filled 2019-07-05: qty 12.9

## 2019-07-05 MED ORDER — SODIUM CHLORIDE 0.9% FLUSH
3.0000 mL | Freq: Once | INTRAVENOUS | Status: AC
  Administered 2019-07-05: 3 mL via INTRAVENOUS

## 2019-07-05 MED ORDER — SODIUM CHLORIDE 0.9 % IV SOLN
200.0000 mg | Freq: Once | INTRAVENOUS | Status: AC
  Administered 2019-07-05: 22:00:00 200 mg via INTRAVENOUS
  Filled 2019-07-05: qty 40

## 2019-07-05 MED ORDER — SODIUM CHLORIDE 0.9 % IV SOLN
100.0000 mg | Freq: Every day | INTRAVENOUS | Status: AC
  Administered 2019-07-06 – 2019-07-09 (×4): 100 mg via INTRAVENOUS
  Filled 2019-07-05 (×5): qty 20

## 2019-07-05 NOTE — Progress Notes (Signed)
Virtual Visit via Telephone Note  I connected with Abigail Castillo on 07/05/19 at  3:15 PM EST by telephone and verified that I am speaking with the correct person using two identifiers.  Location: Patient: Home Provider: Office   I discussed the limitations, risks, security and privacy concerns of performing an evaluation and management service by telephone and the availability of in person appointments. I also discussed with the patient that there may be a patient responsible charge related to this service. The patient expressed understanding and agreed to proceed.   History of Present Illness: 69 year old woman with severe COPD and associate exertional hypoxemia, usually on 2 L/min.  We have been managing her on Stiolto.  She has frequent exacerbations, history of chronic cough.   Observations/Objective: She reports today that she has been exposed to COVID-19 through family members, daughter.  She is having headache, body aches, diarrhea.  Also with progressive dyspnea.  She reports that she began to feel weakness, aches about 10 days. Some cough, non-productive. She is having more shortness of breath, happening at rest and w exertion.  No wheeze.   Assessment and Plan: I suspect that she has COVID-19, from her daughter and several other sick exposures she has recently had. She is high risk for decompensation given her underlying COPD and hypoxemic respiratory failure.  I think she needs a chest x-ray ASAP, COVID-19 testing ASAP and the best way to facilitate this would be to have her go to the emergency department.  She likely will require admission depending on her oxygen needs.  Unfortunately her daughter will not be able to accompany her into the department given her Covid status.  I will call the ED at [facility] to let her them know that she is coming.  Follow Up Instructions: 1 month   I discussed the assessment and treatment plan with the patient. The patient was provided an opportunity to  ask questions and all were answered. The patient agreed with the plan and demonstrated an understanding of the instructions.   The patient was advised to call back or seek an in-person evaluation if the symptoms worsen or if the condition fails to improve as anticipated.  I provided 25 minutes of non-face-to-face time during this encounter.   Collene Gobble, MD

## 2019-07-05 NOTE — ED Notes (Signed)
Pt given sandwich 

## 2019-07-05 NOTE — ED Notes (Signed)
Daughter Lovey Newcomer, [phone]) call for updates.

## 2019-07-05 NOTE — ED Triage Notes (Signed)
Pt states she was exposed to covid 10 days ago. Pt complains of SOB, diarrhea, weakness. Pt wears 2L [place] at home. Sats 90% on those 2L [place]

## 2019-07-05 NOTE — ED Provider Notes (Signed)
[facility] Provider Note   CSN: [id number] Arrival date & time: 07/05/19  1724     History Chief Complaint  Patient presents with  . Diarrhea  . Shortness of Breath  . Weakness    Abigail Castillo is a 69 y.o. female.  Patient with history of severe COPD/emphysema, dependent on home oxygen 2 L/min --presents to the emergency department with worsening shortness of breath and weakness.  Patient has been exposed to her daughter who was diagnosed with coronavirus.  Patient denies any fevers, congestion, sore throat.  She denies any nausea vomiting.  She has had a decreased appetite.  She states she has had multiple episodes of diarrhea.  No abdominal pain.  She feels tight in her chest but denies any focal chest pain.  Normal urination.  No lower extremity swelling.  She states that sometimes she has had to increase her oxygen to 3 L/min.  She has been using her breathing medications at home.  Patient had a video visit with Dr. Lamonte Sakai her pulmonologist today.  He recommended that she come to the emergency department.  He suspected that she had coronavirus and would require admission.        Past Medical History:  Diagnosis Date  . Anxiety   . Blood transfusion   . Blood transfusion without reported diagnosis   . Chronic mental illness   . COPD (chronic obstructive pulmonary disease) ([place])   . DEPRESSION 05/31/2009  . Emphysema of lung ([place])   . Esophageal stricture 03/14/2015  . History of home oxygen therapy   . History of pneumonia   . Osteoporosis   . Shortness of breath dyspnea   . Tobacco abuse    ongoing  . Vitamin B 12 deficiency   . Vitamin D deficiency     Patient Active Problem List   Diagnosis Date Noted  . Chronic respiratory failure ([place]) 08/25/2017  . Allergic rhinitis 06/16/2017  . Schizoaffective disorder ([place]) 06/30/2016  . GERD (gastroesophageal reflux disease) 11/02/2015  . Esophageal stricture 03/14/2015  . Mild  hyperlipidemia 02/03/2014  . Vitamin D deficiency 02/03/2014  . Encounter for routine gynecological examination 06/14/2013  . Mass of breast, right 06/14/2013  . Personal history of colonic polyps 03/10/2012  . B12 deficiency 11/11/2010  . Memory change 11/04/2010  . HYPERTRIGLYCERIDEMIA 06/14/2010  . Depression 05/31/2009  . History of tobacco abuse 03/02/2008  . Osteoporosis 04/05/2007  . COPD (chronic obstructive pulmonary disease) ([place]) 03/24/2007  . HOARSENESS, CHRONIC 03/24/2007    Past Surgical History:  Procedure Laterality Date  . ABDOMINAL HYSTERECTOMY    . BREAST EXCISIONAL BIOPSY Left 2017  . BREAST LUMPECTOMY  1972   left breast  . BREAST LUMPECTOMY WITH RADIOACTIVE SEED LOCALIZATION Left 11/22/2015   Procedure: LEFT BREAST LUMPECTOMY WITH RADIOACTIVE SEED LOCALIZATION;  Surgeon: Erroll Luna, MD;  Location: [facility];  Service: General;  Laterality: Left;  . BREAST SURGERY  ?2015   lumpectomy, left  . CERVICAL [place]E BIOPSY    . CHOLECYSTECTOMY    . COLONOSCOPY    . ESOPHAGOGASTRODUODENOSCOPY (EGD) WITH ESOPHAGEAL DILATION    . UPPER GASTROINTESTINAL ENDOSCOPY       OB History   No obstetric history on file.     Family History  Problem Relation Age of Onset  . Coronary artery disease Mother   . Diabetes Mother   . Hypertension Mother   . Coronary artery disease Father   . Diabetes Father  Grandfather  . Hypertension Father   . Stroke Father   . Other Father        colostomy bag  . Breast cancer Sister   . Cancer Other        niece--breast  . Diabetes Sister   . Hypertension Sister   . Mental illness Sister        x 4  . Esophageal cancer Neg Hx   . Stomach cancer Neg Hx   . Rectal cancer Neg Hx   . Colon cancer Neg Hx   . Colon polyps Neg Hx     Social History   Tobacco Use  . Smoking status: Former Smoker    Types: Cigarettes    Quit date: 08/12/2011    Years since quitting: 7.9  . Smokeless tobacco: Never Used  Substance Use  Topics  . Alcohol use: No    Alcohol/week: 0.0 standard drinks  . Drug use: No    Home Medications Prior to Admission medications   Medication Sig Start Date End Date Taking? Authorizing Provider  cholecalciferol (VITAMIN D) 1000 UNITS tablet Take 3,000 Units by mouth daily.     [provider]  DEXILANT 60 MG capsule TAKE ONE CAPSULE BY MOUTH DAILY 05/18/19   [name], NP  LORazepam (ATIVAN) 1 MG tablet Take 1 mg by mouth 2 (two) times daily as needed. 09/07/17   [provider]  meloxicam (MOBIC) 7.5 MG tablet Take 1 tablet (7.5 mg total) by mouth daily as needed (neck pain). 01/04/19   [name], NP  OLANZapine (ZYPREXA) 5 MG tablet 7.5 mg.  06/18/16   [provider]  OXYGEN Inhale 2 L into the lungs.     [provider]  Spacer/Aero-Holding Chambers (AEROCHAMBER MV) inhaler Use as instructed 03/13/17   Collene Gobble, MD  STIOLTO RESPIMAT 2.5-2.5 MCG/ACT AERS INHALE TWO PUFFS INTO THE LUNGS DAILY 06/03/18   Collene Gobble, MD  VENTOLIN HFA 108 (90 Base) MCG/ACT inhaler INHALE TWO PUFFS BY MOUTH INTO THE LUNGS EVERY 6 HOURS AS NEEDED FOR WHEEZING OR SHORTNESS OF BREATH 06/09/19   [name], NP  vitamin B-12 (CYANOCOBALAMIN) 1000 MCG tablet TAKE ONE TABLET BY MOUTH DAILY 03/21/19   [name], NP    Allergies    Sulfonamide derivatives  Review of Systems   Review of Systems  Constitutional: Positive for appetite change. Negative for fever.  HENT: Negative for rhinorrhea and sore throat.   Eyes: Negative for redness.  Respiratory: Positive for chest tightness, shortness of breath and wheezing. Negative for cough.   Cardiovascular: Negative for chest pain.  Gastrointestinal: Positive for diarrhea. Negative for abdominal pain, nausea and vomiting.  Genitourinary: Negative for dysuria.  Musculoskeletal: Negative for myalgias.  Skin: Negative for rash.  Neurological: Negative for headaches.    Physical  Exam Updated Vital Signs BP (!) 147/78 (BP Location: Left Arm)   Pulse (!) 120   Temp 98.4 F (36.9 C) (Oral)   Resp (!) 24   SpO2 (!) 89%   Physical Exam Vitals and nursing note reviewed.  Constitutional:      Appearance: She is well-developed.  HENT:     Head: Normocephalic and atraumatic.  Eyes:     General:        Right eye: No discharge.        Left eye: No discharge.     Conjunctiva/sclera: Conjunctivae normal.  Cardiovascular:     Rate and Rhythm: Normal rate and regular rhythm.  Heart sounds: Normal heart sounds.  Pulmonary:     Effort: Accessory muscle usage present.     Breath sounds: Decreased breath sounds and wheezing (mild) present. No rhonchi or rales.  Abdominal:     Palpations: Abdomen is soft.     Tenderness: There is no abdominal tenderness.  Musculoskeletal:     Cervical back: Normal range of motion and neck supple.     Right lower leg: No tenderness. No edema.     Left lower leg: No tenderness. No edema.  Skin:    General: Skin is warm and dry.  Neurological:     Mental Status: She is alert.     ED Results / Procedures / Treatments   Labs (all labs ordered are listed, but only abnormal results are displayed) Labs Reviewed  COMPREHENSIVE METABOLIC PANEL - Abnormal; Notable for the following components:      Result Value   Glucose, Bld 133 (*)    Total Bilirubin 2.2 (*)    Anion gap 16 (*)    All other components within normal limits  CBC WITH DIFFERENTIAL/PLATELET - Abnormal; Notable for the following components:   Hemoglobin 15.5 (*)    HCT 48.6 (*)    All other components within normal limits  D-DIMER, QUANTITATIVE (NOT AT [facility]) - Abnormal; Notable for the following components:   D-Dimer, Quant 1.16 (*)    All other components within normal limits  FIBRINOGEN - Abnormal; Notable for the following components:   Fibrinogen 582 (*)    All other components within normal limits  C-REACTIVE PROTEIN - Abnormal; Notable for the following  components:   CRP 3.5 (*)    All other components within normal limits  POC SARS CORONAVIRUS 2 AG -  ED - Abnormal; Notable for the following components:   SARS Coronavirus 2 Ag POSITIVE (*)    All other components within normal limits  CULTURE, BLOOD (ROUTINE X 2)  CULTURE, BLOOD (ROUTINE X 2)  LACTIC ACID, PLASMA  LACTIC ACID, PLASMA  LACTATE DEHYDROGENASE  FERRITIN  TRIGLYCERIDES  URINALYSIS, ROUTINE W REFLEX MICROSCOPIC  PROCALCITONIN    ED ECG REPORT   Date: 07/05/2019  Rate: 111  Rhythm: sinus tachycardia  QRS Axis: normal  Intervals: normal  ST/T Wave abnormalities: nonspecific ST changes  Conduction Disutrbances:none  Narrative Interpretation:   Old EKG Reviewed: unchanged, similar ST segment depressions from 07/2015  I have personally reviewed the EKG tracing and agree with the computerized printout as noted.  Radiology DG Chest Portable 1 View  Result Date: 07/05/2019 CLINICAL DATA:  Short of breath, diarrhea, weakness EXAM: PORTABLE CHEST 1 VIEW COMPARISON:  03/10/2016 FINDINGS: Single frontal view of the chest demonstrates a stable cardiac silhouette. There is extensive upper lobe predominant bullous emphysema. Severe bibasilar scarring and fibrosis again noted. No airspace disease, effusion, or pneumothorax. No acute bony abnormality. IMPRESSION: 1. Stable severe emphysema.  No acute process. Electronically Signed   By: Randa Ngo M.D.   On: 07/05/2019 19:16    Procedures Procedures (including critical care time)  Medications Ordered in ED Medications  sodium chloride flush (NS) 0.9 % injection 3 mL (has no administration in time range)  ipratropium (ATROVENT HFA) inhaler 2 puff (2 puffs Inhalation Given 07/05/19 1943)  albuterol (VENTOLIN HFA) 108 (90 Base) MCG/ACT inhaler 8 puff (has no administration in time range)  albuterol (VENTOLIN HFA) 108 (90 Base) MCG/ACT inhaler 8 puff (8 puffs Inhalation Given 07/05/19 1942)  methylPREDNISolone sodium succinate  (SOLU-MEDROL) 125 mg/2 mL injection  125 mg (125 mg Intravenous Given 07/05/19 1943)    ED Course  I have reviewed the triage vital signs and the nursing notes.  Pertinent labs & imaging results that were available during my care of the patient were reviewed by me and considered in my medical decision making (see chart for details).  Patient seen and examined. COVID+.   Vital signs reviewed and are as follows: BP (!) 147/78 (BP Location: Left Arm)   Pulse (!) 120   Temp 98.4 F (36.9 C) (Oral)   Resp (!) 24   SpO2 (!) 89%    Patient is tachypneic with increased expiratory phase on exam.  She has diminished lung sounds in all fields with some faint expiratory wheezing.  She does not appear to be fluid overloaded.  She looks somewhat uncomfortable with mild increased work of breathing.  Oxygen saturation in the mid 90s on 2 L at rest.  Patient will likely need admission for COPD exacerbation in setting of coronavirus.  Albuterol, Atrovent, IV Solu-Medrol ordered.  8:39 PM patient rechecked.  In regards to work of breathing, she looks a little more comfortable.  Lung sounds are still very diminished.  We will consult hospitalist for admission.  She remains on 2L O2 [place].   Abigail Castillo was evaluated in Emergency Department on 07/05/2019 for the symptoms described in the history of present illness. She was evaluated in the context of the global COVID-19 pandemic, which necessitated consideration that the patient might be at risk for infection with the SARS-CoV-2 virus that causes COVID-19. Institutional protocols and algorithms that pertain to the evaluation of patients at risk for COVID-19 are in a state of rapid change based on information released by regulatory bodies including the CDC and federal and state organizations. These policies and algorithms were followed during the patient's care in the ED.  8:55 PM I discussed patient with Dr. Hal Hope of [facility] hospitalist who will see patient.   Request that I initiate treatment with remdesivir.      MDM Rules/Calculators/A&P                      Patient with shortness of breath, tachypnea, COPD exacerbation in setting of new diagnosis of coronavirus.  Hypoxic on arrival however more reasonable oxygen saturations on room air on her home oxygen.  She does remain very short of breath with very diminished lung sounds however.  She is a high risk patient and will need admission for stabilization.  Patient has received breathing medications, IV steroids.    Final Clinical Impression(s) / ED Diagnoses Final diagnoses:  COPD exacerbation [facility])  COVID-19    Rx / DC Orders ED Discharge Orders    None       Suann Larry 07/05/19 2056    Carmin Muskrat, MD 07/06/19 [phone]

## 2019-07-06 ENCOUNTER — Encounter (HOSPITAL_COMMUNITY): Payer: Self-pay | Admitting: Internal Medicine

## 2019-07-06 DIAGNOSIS — U071 COVID-19: Secondary | ICD-10-CM | POA: Diagnosis present

## 2019-07-06 DIAGNOSIS — J449 Chronic obstructive pulmonary disease, unspecified: Secondary | ICD-10-CM

## 2019-07-06 DIAGNOSIS — J9600 Acute respiratory failure, unspecified whether with hypoxia or hypercapnia: Secondary | ICD-10-CM

## 2019-07-06 HISTORY — DX: COVID-19: U07.1

## 2019-07-06 LAB — URINALYSIS, ROUTINE W REFLEX MICROSCOPIC
Bilirubin Urine: NEGATIVE
Glucose, UA: 50 mg/dL — AB
Hgb urine dipstick: NEGATIVE
Ketones, ur: 20 mg/dL — AB
Leukocytes,Ua: NEGATIVE
Nitrite: NEGATIVE
Protein, ur: 30 mg/dL — AB
Specific Gravity, Urine: 1.034 — ABNORMAL HIGH (ref 1.005–1.030)
pH: 5 (ref 5.0–8.0)

## 2019-07-06 LAB — CBC
HCT: 45.7 % (ref 36.0–46.0)
Hemoglobin: 14.3 g/dL (ref 12.0–15.0)
MCH: 30 pg (ref 26.0–34.0)
MCHC: 31.3 g/dL (ref 30.0–36.0)
MCV: 96 fL (ref 80.0–100.0)
Platelets: 246 10*3/uL (ref 150–400)
RBC: 4.76 MIL/uL (ref 3.87–5.11)
RDW: 12.7 % (ref 11.5–15.5)
WBC: 3 10*3/uL — ABNORMAL LOW (ref 4.0–10.5)
nRBC: 0 % (ref 0.0–0.2)

## 2019-07-06 LAB — CREATININE, SERUM
Creatinine, Ser: 0.94 mg/dL (ref 0.44–1.00)
GFR calc Af Amer: >60 mL/min (ref >60)
GFR calc non Af Amer: >60 mL/min (ref >60)

## 2019-07-06 LAB — HIV ANTIBODY (ROUTINE TESTING W REFLEX): HIV Screen 4th Generation wRfx: NONREACTIVE

## 2019-07-06 MED ORDER — ACETAMINOPHEN 650 MG RE SUPP: 650.0000 mg | Freq: Four times a day (QID) | RECTAL | Status: DC | PRN

## 2019-07-06 MED ORDER — LORAZEPAM 1 MG PO TABS
1.0000 mg | ORAL_TABLET | Freq: Two times a day (BID) | ORAL | Status: DC | PRN
  Administered 2019-07-10: 1 mg via ORAL
  Filled 2019-07-06: qty 1

## 2019-07-06 MED ORDER — ACETAMINOPHEN 325 MG PO TABS: 650.0000 mg | ORAL_TABLET | Freq: Four times a day (QID) | ORAL | Status: DC | PRN

## 2019-07-06 MED ORDER — ONDANSETRON HCL 4 MG/2ML IJ SOLN: 4.0000 mg | Freq: Four times a day (QID) | INTRAMUSCULAR | Status: DC | PRN

## 2019-07-06 MED ORDER — ONDANSETRON HCL 4 MG PO TABS: 4.0000 mg | ORAL_TABLET | Freq: Four times a day (QID) | ORAL | Status: DC | PRN

## 2019-07-06 MED ORDER — ENOXAPARIN SODIUM 40 MG/0.4ML ~~LOC~~ SOLN
40.0000 mg | Freq: Every day | SUBCUTANEOUS | Status: DC
  Administered 2019-07-06 – 2019-07-11 (×7): 40 mg via SUBCUTANEOUS
  Filled 2019-07-06 (×7): qty 0.4

## 2019-07-06 MED ORDER — OLANZAPINE 7.5 MG PO TABS
7.5000 mg | ORAL_TABLET | Freq: Every day | ORAL | Status: DC
  Administered 2019-07-06 – 2019-07-12 (×7): 7.5 mg via ORAL
  Filled 2019-07-06 (×7): qty 1

## 2019-07-06 MED ORDER — VITAMIN B-12 1000 MCG PO TABS
1000.0000 ug | ORAL_TABLET | Freq: Every day | ORAL | Status: DC
  Administered 2019-07-06 – 2019-07-12 (×7): 1000 ug via ORAL
  Filled 2019-07-06 (×7): qty 1

## 2019-07-06 MED ORDER — PANTOPRAZOLE SODIUM 40 MG PO TBEC
40.0000 mg | DELAYED_RELEASE_TABLET | Freq: Every day | ORAL | Status: DC
  Administered 2019-07-06 – 2019-07-12 (×7): 40 mg via ORAL
  Filled 2019-07-06 (×7): qty 1

## 2019-07-06 MED ORDER — ALBUTEROL SULFATE HFA 108 (90 BASE) MCG/ACT IN AERS
8.0000 | INHALATION_SPRAY | RESPIRATORY_TRACT | Status: DC
  Administered 2019-07-06 – 2019-07-12 (×35): 8 via RESPIRATORY_TRACT
  Filled 2019-07-06: qty 6.7

## 2019-07-06 MED ORDER — METHYLPREDNISOLONE SODIUM SUCC 40 MG IJ SOLR
40.0000 mg | Freq: Two times a day (BID) | INTRAMUSCULAR | Status: DC
  Administered 2019-07-06 – 2019-07-12 (×13): 40 mg via INTRAVENOUS
  Filled 2019-07-06 (×14): qty 1

## 2019-07-06 NOTE — ED Notes (Signed)
Tele   Breakfast ordered  

## 2019-07-06 NOTE — ED Notes (Signed)
Called ptar for pt transport to [facility]

## 2019-07-06 NOTE — Progress Notes (Signed)
PROGRESS NOTE  DANEA HENARD [id number] DOB: [birth year] DOA: 07/05/2019 PCP: [name], NP  HPI/Recap of past 59 hours: 69 year old female with past medical history of schizoaffective disorder and COPD on 2 L oxygen at night who is had progressively worsening shortness of breath for the past few days plus diarrhea and presented to the emergency room on the night of 2/9.  Noted to have an elevated CRP level and positive Covid test.  She was admitted for COPD exacerbation and Covid pneumonia.  Also noted to be hypoxic initially requiring 2 to 3 L nasal cannula to keep oxygen saturations greater than 90%.  Patient admitted to the hospitalist service and started on IV steroids and Remdisivir and transferred to [facility].  Patient seen after transfer.  She states her breathing is doing somewhat better.  She denies any pain and feels like her cough has gotten better.  Assessment/Plan: Principal Problem:   Acute on chronic respiratory failure with hypoxia (HCC) and patient with history of COPD: Patient no longer smokes.  Continue oxygen supplementation, steroids and Remdisivir.  Oxygen saturations appear overall stable.  Normal procalcitonin level, so no need for additional IV antibiotics.  Follow CRP and hopefully will make a quick recovery. Active Problems:   History of tobacco abuse: Quit several years ago.    COPD (chronic obstructive pulmonary disease) (HCC)    Schizoaffective disorder (HCC)/depression: Continue home medications.  Code Status: Full code  Family Communication: Updated daughter by phone  Disposition Plan: Potential discharge in the next few days once CRP further normalized and able to be weaned down to home baseline of oxygen  Consultants:  None  Procedures:  None  Antimicrobials:  IV Remdisivir 2/9-present  DVT prophylaxis: Lovenox   Objective: Vitals:   07/06/19 1000 07/06/19 1138  BP: 116/86 126/72  Pulse: 93 (!) 101  Resp: 17 16   Temp:  98.1 F (36.7 C)  SpO2: 94% 92%    Intake/Output Summary (Last 24 hours) at 07/06/2019 1645 Last data filed at 07/06/2019 1500 Gross per 24 hour  Intake 490 ml  Output --  Net 490 ml   There were no vitals filed for this visit. There is no height or weight on file to calculate BMI.  Exam:   General: Alert and oriented x3, no acute distress  HEENT: Normocephalic and atraumatic, mucous membranes are moist  Cardiovascular: Regular rate and rhythm, S1-S2  Respiratory: Decreased breath sounds throughout  Abdomen: Soft, nontender, nondistended, positive bowel sounds  Musculoskeletal: No clubbing or cyanosis or edema  Skin: No skin breaks, tears or lesions  Psychiatry: Appropriate, no evidence of psychoses   Data Reviewed: CBC: Recent Labs  Lab 07/05/19 1817 07/06/19 0232  WBC 6.2 3.0*  NEUTROABS 4.6  --   HGB 15.5* 14.3  HCT 48.6* 45.7  MCV 95.5 96.0  PLT 293 0000000   Basic Metabolic Panel: Recent Labs  Lab 07/05/19 1817 07/06/19 0232  NA 141  --   K 3.6  --   CL 102  --   CO2 23  --   GLUCOSE 133*  --   BUN 16  --   CREATININE 0.66 0.94  CALCIUM 9.2  --    GFR: CrCl cannot be calculated (Unknown ideal weight.). Liver Function Tests: Recent Labs  Lab 07/05/19 1817  AST 24  ALT 22  ALKPHOS 90  BILITOT 2.2*  PROT 7.3  ALBUMIN 3.8   No results for input(s): LIPASE, AMYLASE in the last 168 hours. No results  for input(s): AMMONIA in the last 168 hours. Coagulation Profile: No results for input(s): INR, PROTIME in the last 168 hours. Cardiac Enzymes: No results for input(s): CKTOTAL, CKMB, CKMBINDEX, TROPONINI in the last 168 hours. BNP (last 3 results) No results for input(s): PROBNP in the last 8760 hours. HbA1C: No results for input(s): HGBA1C in the last 72 hours. CBG: No results for input(s): GLUCAP in the last 168 hours. Lipid Profile: Recent Labs    07/05/19 1935  TRIG 94   Thyroid Function Tests: No results for input(s):  TSH, T4TOTAL, FREET4, T3FREE, THYROIDAB in the last 72 hours. Anemia Panel: Recent Labs    07/05/19 1935  FERRITIN 88   Urine analysis:    Component Value Date/Time   COLORURINE AMBER (A) 07/06/2019 0712   APPEARANCEUR HAZY (A) 07/06/2019 0712   LABSPEC 1.034 (H) 07/06/2019 0712   PHURINE 5.0 07/06/2019 0712   GLUCOSEU 50 (A) 07/06/2019 0712   GLUCOSEU NEGATIVE 11/11/2017 1027   HGBUR NEGATIVE 07/06/2019 0712   BILIRUBINUR NEGATIVE 07/06/2019 0712   KETONESUR 20 (A) 07/06/2019 0712   PROTEINUR 30 (A) 07/06/2019 0712   UROBILINOGEN 0.2 11/11/2017 1027   NITRITE NEGATIVE 07/06/2019 0712   LEUKOCYTESUR NEGATIVE 07/06/2019 0712   Sepsis Labs: @LABRCNTIP (procalcitonin:4,lacticidven:4)  ) Recent Results (from the past 240 hour(s))  Blood Culture (routine x 2)     Status: None (Preliminary result)   Collection Time: 07/05/19  7:20 PM   Specimen: BLOOD RIGHT WRIST  Result Value Ref Range Status   Specimen Description BLOOD RIGHT WRIST  Final   Special Requests   Final    BOTTLES DRAWN AEROBIC AND ANAEROBIC Blood Culture adequate volume   Culture   Final    NO GROWTH < 12 HOURS Performed at [facility], [place] [address] [place], [place] [address]    Report Status PENDING  Incomplete  Blood Culture (routine x 2)     Status: None (Preliminary result)   Collection Time: 07/05/19  7:25 PM   Specimen: BLOOD  Result Value Ref Range Status   Specimen Description BLOOD LEFT ANTECUBITAL  Final   Special Requests   Final    BOTTLES DRAWN AEROBIC AND ANAEROBIC Blood Culture adequate volume   Culture   Final    NO GROWTH < 12 HOURS Performed at [facility], [place] [address] [place], [place] [address]    Report Status PENDING  Incomplete      Studies: DG Chest Portable 1 View  Result Date: 07/05/2019 CLINICAL DATA:  Short of breath, diarrhea, weakness EXAM: PORTABLE CHEST 1 VIEW COMPARISON:  03/10/2016 FINDINGS: Single frontal view of the chest demonstrates a stable  cardiac silhouette. There is extensive upper lobe predominant bullous emphysema. Severe bibasilar scarring and fibrosis again noted. No airspace disease, effusion, or pneumothorax. No acute bony abnormality. IMPRESSION: 1. Stable severe emphysema.  No acute process. Electronically Signed   By: Randa Ngo M.D.   On: 07/05/2019 19:16    Scheduled Meds: . albuterol  8 puff Inhalation Q4H  . enoxaparin (LOVENOX) injection  40 mg Subcutaneous QHS  . methylPREDNISolone (SOLU-MEDROL) injection  40 mg Intravenous Q12H  . OLANZapine  7.5 mg Oral Daily  . pantoprazole  40 mg Oral Daily  . vitamin B-12  1,000 mcg Oral Daily    Continuous Infusions: . remdesivir 100 mg in NS 100 mL 100 mg (07/06/19 0917)     LOS: 1 day     Annita Brod, MD [facility]  07/06/2019, 4:45 PM

## 2019-07-06 NOTE — H&P (Signed)
History and Physical    Abigail Castillo [id number] DOB: [birth year] DOA: 07/05/2019  PCP: [name], NP  Patient coming from: Home.  Chief Complaint: Shortness of breath.  HPI: Abigail Castillo is a 69 y.o. female with history of COPD schizoaffective disorder was exposed to COVID-19 infection about 10 days ago and started getting more short of breath last few days.  Also has had 2-3 episodes diarrhea last few days.  Denies any abdominal pain or vomiting.  Denies chest pain fever chills.  Has been a nonproductive cough.  Had called up her pulmonologist who advised her to come to the ER.  ED Course: In the ER patient was found to be wheezing chest x-ray does not show any infiltrates and EKG was showing sinus tachycardia.  Patient was afebrile labs show CRP 3.5 procalcitonin less than 0.1 lactic acid 1.6 CBC and metabolic panel largely unremarkable.  D-dimer is 1.16.  Patient was started on IV steroids inhalers for COPD exacerbation along with remdesivir.  Review of Systems: As per HPI, rest all negative.   Past Medical History:  Diagnosis Date  . Anxiety   . Blood transfusion   . Blood transfusion without reported diagnosis   . Chronic mental illness   . COPD (chronic obstructive pulmonary disease) ([place])   . DEPRESSION 05/31/2009  . Emphysema of lung ([place])   . Esophageal stricture 03/14/2015  . History of home oxygen therapy   . History of pneumonia   . Osteoporosis   . Shortness of breath dyspnea   . Tobacco abuse    ongoing  . Vitamin B 12 deficiency   . Vitamin D deficiency     Past Surgical History:  Procedure Laterality Date  . ABDOMINAL HYSTERECTOMY    . BREAST EXCISIONAL BIOPSY Left 2017  . BREAST LUMPECTOMY  1972   left breast  . BREAST LUMPECTOMY WITH RADIOACTIVE SEED LOCALIZATION Left 11/22/2015   Procedure: LEFT BREAST LUMPECTOMY WITH RADIOACTIVE SEED LOCALIZATION;  Surgeon: Erroll Luna, MD;  Location: [facility];  Service: General;  Laterality: Left;  .  BREAST SURGERY  ?2015   lumpectomy, left  . CERVICAL CONE BIOPSY    . CHOLECYSTECTOMY    . COLONOSCOPY    . ESOPHAGOGASTRODUODENOSCOPY (EGD) WITH ESOPHAGEAL DILATION    . UPPER GASTROINTESTINAL ENDOSCOPY       reports that she quit smoking about 7 years ago. Her smoking use included cigarettes. She has never used smokeless tobacco. She reports that she does not drink alcohol or use drugs.  Allergies  Allergen Reactions  . Sulfonamide Derivatives Hives    Family History  Problem Relation Age of Onset  . Coronary artery disease Mother   . Diabetes Mother   . Hypertension Mother   . Coronary artery disease Father   . Diabetes Father        Grandfather  . Hypertension Father   . Stroke Father   . Other Father        colostomy bag  . Breast cancer Sister   . Cancer Other        niece--breast  . Diabetes Sister   . Hypertension Sister   . Mental illness Sister        x 4  . Esophageal cancer Neg Hx   . Stomach cancer Neg Hx   . Rectal cancer Neg Hx   . Colon cancer Neg Hx   . Colon polyps Neg Hx     Prior to Admission medications   Medication Sig  Start Date End Date Taking? Authorizing Provider  cholecalciferol (VITAMIN D) 1000 UNITS tablet Take 3,000 Units by mouth daily.    Yes [provider]  DEXILANT 60 MG capsule TAKE ONE CAPSULE BY MOUTH DAILY Patient taking differently: Take 60 mg by mouth daily.  05/18/19  Yes [name], NP  LORazepam (ATIVAN) 1 MG tablet Take 1 mg by mouth 2 (two) times daily as needed for anxiety.  09/07/17  Yes [provider]  OLANZapine (ZYPREXA) 5 MG tablet Take 7.5 mg by mouth daily.  06/18/16  Yes [provider]  OXYGEN Inhale 2 L into the lungs.    Yes [provider]  Spacer/Aero-Holding Chambers (AEROCHAMBER MV) inhaler Use as instructed 03/13/17  Yes Byrum, Rose Fillers, MD  STIOLTO RESPIMAT 2.5-2.5 MCG/ACT AERS INHALE TWO PUFFS INTO THE LUNGS DAILY Patient taking differently: Inhale 2 puffs  into the lungs daily.  06/03/18  Yes Byrum, Rose Fillers, MD  VENTOLIN HFA 108 (90 Base) MCG/ACT inhaler INHALE TWO PUFFS BY MOUTH INTO THE LUNGS EVERY 6 HOURS AS NEEDED FOR WHEEZING OR SHORTNESS OF BREATH Patient taking differently: Inhale 2 puffs into the lungs every 6 (six) hours as needed for shortness of breath.  06/09/19  Yes [name], NP  vitamin B-12 (CYANOCOBALAMIN) 1000 MCG tablet TAKE ONE TABLET BY MOUTH DAILY 03/21/19  Yes [name], NP  meloxicam (MOBIC) 7.5 MG tablet Take 1 tablet (7.5 mg total) by mouth daily as needed (neck pain). Patient not taking: Reported on 07/05/2019 01/04/19   [name], NP    Physical Exam: Constitutional: Moderately built and nourished. Vitals:   07/05/19 2315 07/06/19 0000 07/06/19 0015 07/06/19 0030  BP:  112/83  114/84  Pulse: 98 97 94 92  Resp: (!) 23 18 (!) 22 (!) 23  Temp:      TempSrc:      SpO2: 95% 95% 96% 96%   Eyes: Anicteric no pallor. ENMT: No discharge from the ears eyes nose or mouth. Neck: No mass felt.  No neck rigidity. Respiratory: No rhonchi or crepitations. Cardiovascular: S1-S2 heard. Abdomen: Soft nontender bowel sounds present. Musculoskeletal: No edema. Skin: No rash. Neurologic: Alert awake oriented to time place and person.  Moves all extremities. Psychiatric: Appears normal per normal affect.   Labs on Admission: I have personally reviewed following labs and imaging studies  CBC: Recent Labs  Lab 07/05/19 1817  WBC 6.2  NEUTROABS 4.6  HGB 15.5*  HCT 48.6*  MCV 95.5  PLT 0000000   Basic Metabolic Panel: Recent Labs  Lab 07/05/19 1817  NA 141  K 3.6  CL 102  CO2 23  GLUCOSE 133*  BUN 16  CREATININE 0.66  CALCIUM 9.2   GFR: CrCl cannot be calculated (Unknown ideal weight.). Liver Function Tests: Recent Labs  Lab 07/05/19 1817  AST 24  ALT 22  ALKPHOS 90  BILITOT 2.2*  PROT 7.3  ALBUMIN 3.8   No results for input(s): LIPASE, AMYLASE in the last 168 hours. No  results for input(s): AMMONIA in the last 168 hours. Coagulation Profile: No results for input(s): INR, PROTIME in the last 168 hours. Cardiac Enzymes: No results for input(s): CKTOTAL, CKMB, CKMBINDEX, TROPONINI in the last 168 hours. BNP (last 3 results) No results for input(s): PROBNP in the last 8760 hours. HbA1C: No results for input(s): HGBA1C in the last 72 hours. CBG: No results for input(s): GLUCAP in the last 168 hours. Lipid Profile: Recent Labs    07/05/19 1935  TRIG 94  Thyroid Function Tests: No results for input(s): TSH, T4TOTAL, FREET4, T3FREE, THYROIDAB in the last 72 hours. Anemia Panel: Recent Labs    07/05/19 1935  FERRITIN 88   Urine analysis:    Component Value Date/Time   COLORURINE YELLOW 11/11/2017 1027   APPEARANCEUR CLEAR 11/11/2017 1027   LABSPEC >=1.030 (A) 11/11/2017 1027   PHURINE 5.5 11/11/2017 1027   GLUCOSEU NEGATIVE 11/11/2017 1027   HGBUR NEGATIVE 11/11/2017 1027   BILIRUBINUR NEGATIVE 11/11/2017 1027   KETONESUR NEGATIVE 11/11/2017 1027   PROTEINUR NEGATIVE 11/08/2008 2312   UROBILINOGEN 0.2 11/11/2017 1027   NITRITE NEGATIVE 11/11/2017 1027   LEUKOCYTESUR TRACE (A) 11/11/2017 1027   Sepsis Labs: @LABRCNTIP (procalcitonin:4,lacticidven:4) )No results found for this or any previous visit (from the past 240 hour(s)).   Radiological Exams on Admission: DG Chest Portable 1 View  Result Date: 07/05/2019 CLINICAL DATA:  Short of breath, diarrhea, weakness EXAM: PORTABLE CHEST 1 VIEW COMPARISON:  03/10/2016 FINDINGS: Single frontal view of the chest demonstrates a stable cardiac silhouette. There is extensive upper lobe predominant bullous emphysema. Severe bibasilar scarring and fibrosis again noted. No airspace disease, effusion, or pneumothorax. No acute bony abnormality. IMPRESSION: 1. Stable severe emphysema.  No acute process. Electronically Signed   By: Randa Ngo M.D.   On: 07/05/2019 19:16    EKG: Independently reviewed.   Sinus tachycardia with nonspecific changes.  Assessment/Plan Principal Problem:   Acute respiratory failure due to COVID-19 [facility]) Active Problems:   COPD (chronic obstructive pulmonary disease) ([place])    1. Acute respiratory failure with hypoxia secondary to Covid infection and COPD exacerbation for which I have placed patient on IV Solu-Medrol and albuterol inhaler and IV remdesivir.  Closely monitor respiratory status inflammatory markers. 2. COPD exacerbation as mentioned in #1. 3. Schizoaffective disorder on Zyprexa and as needed lorazepam.  Given the acute respiratory failure with Covid infection will need close monitoring for any further deterioration and will need inpatient status.   DVT prophylaxis: Lovenox. Code Status: Full code. Family Communication: Discussed with patient. Disposition Plan: Home. Consults called: None. Admission status: Inpatient.   Rise Patience MD [facility] Pager [phone].  If 7PM-7AM, please contact night-coverage [email or web address] Password [facility]  07/06/2019, 12:46 AM

## 2019-07-06 NOTE — ED Notes (Signed)
[facility] called. 

## 2019-07-07 DIAGNOSIS — J441 Chronic obstructive pulmonary disease with (acute) exacerbation: Secondary | ICD-10-CM

## 2019-07-07 DIAGNOSIS — F259 Schizoaffective disorder, unspecified: Secondary | ICD-10-CM

## 2019-07-07 LAB — COMPREHENSIVE METABOLIC PANEL
ALT: 20 U/L (ref 0–44)
AST: 17 U/L (ref 15–41)
Albumin: 3.7 g/dL (ref 3.5–5.0)
Alkaline Phosphatase: 84 U/L (ref 38–126)
Anion gap: 10 (ref 5–15)
BUN: 30 mg/dL — ABNORMAL HIGH (ref 8–23)
CO2: 30 mmol/L (ref 22–32)
Calcium: 9.3 mg/dL (ref 8.9–10.3)
Chloride: 102 mmol/L (ref 98–111)
Creatinine, Ser: 0.7 mg/dL (ref 0.44–1.00)
GFR calc Af Amer: >60 mL/min (ref >60)
GFR calc non Af Amer: >60 mL/min (ref >60)
Glucose, Bld: 186 mg/dL — ABNORMAL HIGH (ref 70–99)
Potassium: 3.5 mmol/L (ref 3.5–5.1)
Sodium: 142 mmol/L (ref 135–145)
Total Bilirubin: 1.1 mg/dL (ref 0.3–1.2)
Total Protein: 7.1 g/dL (ref 6.5–8.1)

## 2019-07-07 LAB — FERRITIN: Ferritin: 89 ng/mL (ref 11–307)

## 2019-07-07 LAB — D-DIMER, QUANTITATIVE: D-Dimer, Quant: 0.69 ug/mL-FEU — ABNORMAL HIGH (ref 0.00–0.50)

## 2019-07-07 LAB — CBC
HCT: 44 % (ref 36.0–46.0)
Hemoglobin: 13.8 g/dL (ref 12.0–15.0)
MCH: 30.2 pg (ref 26.0–34.0)
MCHC: 31.4 g/dL (ref 30.0–36.0)
MCV: 96.3 fL (ref 80.0–100.0)
Platelets: 283 10*3/uL (ref 150–400)
RBC: 4.57 MIL/uL (ref 3.87–5.11)
RDW: 13 % (ref 11.5–15.5)
WBC: 7.6 10*3/uL (ref 4.0–10.5)
nRBC: 0 % (ref 0.0–0.2)

## 2019-07-07 LAB — SAMPLE TO BLOOD BANK

## 2019-07-07 MED ORDER — LIP MEDEX EX OINT
1.0000 "application " | TOPICAL_OINTMENT | CUTANEOUS | Status: DC | PRN
  Administered 2019-07-08: 1 via TOPICAL
  Filled 2019-07-07: qty 7

## 2019-07-07 NOTE — Progress Notes (Signed)
PROGRESS NOTE  Abigail Castillo [id number] DOB: 08-Aug-1950 DOA: 07/05/2019 PCP: [name], NP  HPI/Recap of past 16 hours: 69 year old female with past medical history of schizoaffective disorder and COPD on 2 L oxygen at night who is had progressively worsening shortness of breath for the past few days plus diarrhea and presented to the emergency room on the night of 2/9.  Noted to have an elevated CRP level and positive Covid test.  She was admitted for COPD exacerbation and Covid pneumonia.  Also noted to be hypoxic initially requiring 2 to 3 L nasal cannula to keep oxygen saturations greater than 90%.  Patient admitted to the hospitalist service and started on IV steroids and Remdisivir and transferred to [facility].  Today patient feeling a little bit better, breathing slightly easier than the previous day.  Still gets very easily winded.  Assessment/Plan: Principal Problem:   Acute on chronic respiratory failure with hypoxia (HCC) and patient with history of COPD: Patient no longer smokes.  Continue oxygen supplementation, steroids and Remdisivir.  Oxygen saturations appear overall stable.  Normal procalcitonin level, so no need for additional IV antibiotics.  Follow CRP and hopefully will make a quick recovery. Active Problems:   History of tobacco abuse: Quit several years ago.    COPD (chronic obstructive pulmonary disease) (HCC)    Schizoaffective disorder (HCC)/depression: Continue home medications.  Code Status: Full code  Family Communication: Updated daughter by phone  Disposition Plan: Discharge in the next few days, likely 2/13, once CRP further normalized and able to be weaned down to home baseline of oxygen.  Last dose of Remdisivir on 2/13.  Patient's daughter has been ill and patient does not drive, so she would not be able to come to the infusion center for outpatient Remdisivir.  Consultants:  None  Procedures:  None  Antimicrobials:  IV  Remdisivir 2/9-present  DVT prophylaxis: Lovenox   Objective: Vitals:   07/07/19 0400 07/07/19 0745  BP: 97/79 (!) 117/92  Pulse: 80 94  Resp: 17 18  Temp: 98.1 F (36.7 C) 97.9 F (36.6 C)  SpO2: 96% 95%    Intake/Output Summary (Last 24 hours) at 07/07/2019 1132 Last data filed at 07/07/2019 0600 Gross per 24 hour  Intake 540 ml  Output -  Net 540 ml   There were no vitals filed for this visit. Body mass index is 27.84 kg/m.  Exam:   General: Alert and oriented x3, no acute distress  HEENT: Normocephalic and atraumatic, mucous membranes are moist  Cardiovascular: Regular rate and rhythm, S1-S2  Respiratory: Decreased breath sounds throughout  Abdomen: Soft, nontender, nondistended, positive bowel sounds  Musculoskeletal: No clubbing or cyanosis or edema  Skin: No skin breaks, tears or lesions  Psychiatry: Appropriate, no evidence of psychoses   Data Reviewed: COVID-19 Labs  Recent Labs    07/05/19 1935 07/07/19 0852  DDIMER 1.16* 0.69*  FERRITIN 88 89  LDH 190  --   CRP 3.5*  --     No results found for: SARSCOV2NAA   CBC: Recent Labs  Lab 07/05/19 1817 07/06/19 0232 07/07/19 0852  WBC 6.2 3.0* 7.6  NEUTROABS 4.6  --   --   HGB 15.5* 14.3 13.8  HCT 48.6* 45.7 44.0  MCV 95.5 96.0 96.3  PLT 293 246 [id number]   Basic Metabolic Panel: Recent Labs  Lab 07/05/19 1817 07/06/19 0232 07/07/19 0852  NA 141  --  142  K 3.6  --  3.5  CL 102  --  102  CO2 23  --  30  GLUCOSE 133*  --  186*  BUN 16  --  30*  CREATININE 0.66 0.94 0.70  CALCIUM 9.2  --  9.3   GFR: CrCl cannot be calculated (Unknown ideal weight.). Liver Function Tests: Recent Labs  Lab 07/05/19 1817 07/07/19 0852  AST 24 17  ALT 22 20  ALKPHOS 90 84  BILITOT 2.2* 1.1  PROT 7.3 7.1  ALBUMIN 3.8 3.7   No results for input(s): LIPASE, AMYLASE in the last 168 hours. No results for input(s): AMMONIA in the last 168 hours. Coagulation Profile: No results for input(s):  INR, PROTIME in the last 168 hours. Cardiac Enzymes: No results for input(s): CKTOTAL, CKMB, CKMBINDEX, TROPONINI in the last 168 hours. BNP (last 3 results) No results for input(s): PROBNP in the last 8760 hours. HbA1C: No results for input(s): HGBA1C in the last 72 hours. CBG: No results for input(s): GLUCAP in the last 168 hours. Lipid Profile: Recent Labs    07/05/19 1935  TRIG 94   Thyroid Function Tests: No results for input(s): TSH, T4TOTAL, FREET4, T3FREE, THYROIDAB in the last 72 hours. Anemia Panel: Recent Labs    07/05/19 1935 07/07/19 0852  FERRITIN 88 89   Urine analysis:    Component Value Date/Time   COLORURINE AMBER (A) 07/06/2019 0712   APPEARANCEUR HAZY (A) 07/06/2019 0712   LABSPEC 1.034 (H) 07/06/2019 0712   PHURINE 5.0 07/06/2019 0712   GLUCOSEU 50 (A) 07/06/2019 0712   GLUCOSEU NEGATIVE 11/11/2017 1027   HGBUR NEGATIVE 07/06/2019 0712   BILIRUBINUR NEGATIVE 07/06/2019 0712   KETONESUR 20 (A) 07/06/2019 0712   PROTEINUR 30 (A) 07/06/2019 0712   UROBILINOGEN 0.2 11/11/2017 1027   NITRITE NEGATIVE 07/06/2019 0712   LEUKOCYTESUR NEGATIVE 07/06/2019 0712   Sepsis Labs: @LABRCNTIP (procalcitonin:4,lacticidven:4)  ) Recent Results (from the past 240 hour(s))  Blood Culture (routine x 2)     Status: None (Preliminary result)   Collection Time: 07/05/19  7:20 PM   Specimen: BLOOD RIGHT WRIST  Result Value Ref Range Status   Specimen Description BLOOD RIGHT WRIST  Final   Special Requests   Final    BOTTLES DRAWN AEROBIC AND ANAEROBIC Blood Culture adequate volume   Culture   Final    NO GROWTH < 12 HOURS Performed at [facility], [place] [address] [place], [place] [address]    Report Status PENDING  Incomplete  Blood Culture (routine x 2)     Status: None (Preliminary result)   Collection Time: 07/05/19  7:25 PM   Specimen: BLOOD  Result Value Ref Range Status   Specimen Description BLOOD LEFT ANTECUBITAL  Final   Special Requests    Final    BOTTLES DRAWN AEROBIC AND ANAEROBIC Blood Culture adequate volume   Culture   Final    NO GROWTH < 12 HOURS Performed at [facility], [place] [address] [place], [place] [address]    Report Status PENDING  Incomplete      Studies: No results found.  Scheduled Meds: . albuterol  8 puff Inhalation Q4H  . enoxaparin (LOVENOX) injection  40 mg Subcutaneous QHS  . methylPREDNISolone (SOLU-MEDROL) injection  40 mg Intravenous Q12H  . OLANZapine  7.5 mg Oral Daily  . pantoprazole  40 mg Oral Daily  . vitamin B-12  1,000 mcg Oral Daily    Continuous Infusions: . remdesivir 100 mg in NS 100 mL 100 mg (07/07/19 1031)     LOS:  2 days     Annita Brod, MD [facility]   07/07/2019, 11:32 AM

## 2019-07-07 NOTE — Progress Notes (Signed)
Home medications described and counted in the presence of charge nurse Jarrett Soho.  Pharmacy home medication form completed and medications placed in secure bag and hand delivered to pharmacy by charge RN.  Patient signed form prior to taking to pharmacy.  Note placed on door that medication is in pharmacy and bag receipt and chart copy of medication inventory placed in chart.

## 2019-07-07 NOTE — Progress Notes (Signed)
Pharmacy Medication Storage Note  Storing home medications for Abigail Castillo in pharmacy secured storage.   Medication storage bag number: [id number]  Delivered to pharmacy @ 1600 (time) by Jarrett Soho (RN name)  Medications will be returned to patient/caregiver upon discharge.  [name], PharmD, BCPS Pharmacy: [phone] 07/07/19 4:21 PM

## 2019-07-07 NOTE — Plan of Care (Signed)
  Problem: Education: Goal: Knowledge of risk factors and measures for prevention of condition will improve Outcome: Progressing   Problem: Coping: Goal: Psychosocial and spiritual needs will be supported Outcome: Progressing   Problem: Respiratory: Goal: Will maintain a patent airway Outcome: Progressing Goal: Complications related to the disease process, condition or treatment will be avoided or minimized Outcome: Progressing   

## 2019-07-08 ENCOUNTER — Other Ambulatory Visit: Payer: Self-pay | Admitting: Family

## 2019-07-08 LAB — CBC
HCT: 39.5 % (ref 36.0–46.0)
Hemoglobin: 12.9 g/dL (ref 12.0–15.0)
MCH: 31.2 pg (ref 26.0–34.0)
MCHC: 32.7 g/dL (ref 30.0–36.0)
MCV: 95.4 fL (ref 80.0–100.0)
Platelets: 303 10*3/uL (ref 150–400)
RBC: 4.14 MIL/uL (ref 3.87–5.11)
RDW: 13.2 % (ref 11.5–15.5)
WBC: 10.6 10*3/uL — ABNORMAL HIGH (ref 4.0–10.5)
nRBC: 0 % (ref 0.0–0.2)

## 2019-07-08 LAB — COMPREHENSIVE METABOLIC PANEL
ALT: 18 U/L (ref 0–44)
AST: 16 U/L (ref 15–41)
Albumin: 3.2 g/dL — ABNORMAL LOW (ref 3.5–5.0)
Alkaline Phosphatase: 81 U/L (ref 38–126)
Anion gap: 10 (ref 5–15)
BUN: 28 mg/dL — ABNORMAL HIGH (ref 8–23)
CO2: 29 mmol/L (ref 22–32)
Calcium: 8.8 mg/dL — ABNORMAL LOW (ref 8.9–10.3)
Chloride: 103 mmol/L (ref 98–111)
Creatinine, Ser: 0.67 mg/dL (ref 0.44–1.00)
GFR calc Af Amer: >60 mL/min (ref >60)
GFR calc non Af Amer: >60 mL/min (ref >60)
Glucose, Bld: 156 mg/dL — ABNORMAL HIGH (ref 70–99)
Potassium: 3.4 mmol/L — ABNORMAL LOW (ref 3.5–5.1)
Sodium: 142 mmol/L (ref 135–145)
Total Bilirubin: 0.4 mg/dL (ref 0.3–1.2)
Total Protein: 6.2 g/dL — ABNORMAL LOW (ref 6.5–8.1)

## 2019-07-08 LAB — FERRITIN: Ferritin: 64 ng/mL (ref 11–307)

## 2019-07-08 LAB — C-REACTIVE PROTEIN: CRP: 0.8 mg/dL (ref <1.0)

## 2019-07-08 LAB — D-DIMER, QUANTITATIVE: D-Dimer, Quant: 0.61 ug/mL-FEU — ABNORMAL HIGH (ref 0.00–0.50)

## 2019-07-08 MED ORDER — POTASSIUM CHLORIDE CRYS ER 20 MEQ PO TBCR
40.0000 meq | EXTENDED_RELEASE_TABLET | Freq: Once | ORAL | Status: AC
  Administered 2019-07-08: 40 meq via ORAL
  Filled 2019-07-08: qty 2

## 2019-07-08 NOTE — Plan of Care (Signed)
Pt A&Ox4. VSS, SpO2 >88% on 2L [place]. No c/o pain throughout shift. Utilizing [facility] indepdently w/o issue  Worked w/ PT, see notes for details. Washed up at sink  OOBTC for half of shift, tolerated transition well.   All safety measures in place, will report to oncoming shift  Parry Po A Indiyah Paone    Problem: Education: Goal: Knowledge of risk factors and measures for prevention of condition will improve Outcome: Progressing   Problem: Coping: Goal: Psychosocial and spiritual needs will be supported Outcome: Progressing   Problem: Respiratory: Goal: Will maintain a patent airway Outcome: Progressing Goal: Complications related to the disease process, condition or treatment will be avoided or minimized Outcome: Progressing

## 2019-07-08 NOTE — Progress Notes (Signed)
Occupational Therapy Evaluation Patient Details Name: Abigail Castillo MRN: [id number] DOB: 12-28-1950 Today's Date: 07/08/2019    History of Present Illness 69 y.o. female with history of COPD schizoaffective disorder was exposed to COVID-19 infection about 2/1 and started getting more short of breath last few days.  Also has had 2-3 episodes diarrhea last few days. She was admitted for COPD exacerbation and Covid pneumonia.  Also noted to be hypoxic initially requiring 2 to 3 L nasal cannula to keep oxygen saturations greater than 90%.  Patient admitted to the hospitalist service and started on IV steroids and Remdisivir and transferred to [facility].   Clinical Impression   PTA pt reports residing home alone in a trailer, independent in all ADL, IADL, and mobility tasks. Pt reports ambulating without an assistive device, but requires 2L of Home O2 via [place]. Pt reports 0 fall in the last 6 months. Patient's O2 stats were in the 90s throughout OT eval, but patient required sitting for bathing, grooming and use of BSC. Patient demonstrates dsypnea upon exertion resulting in extra time to recover and assist from staff with set-up. Patient was. Educated pt on breathing strategies and energy conservation techniques with fair understanding and follow through. All questions/concerns answered at this time. Patient required verbal cues for encouragement to use flutter valve and incentive spirometer (227mL) only agreeable to 3 times for each. Patient will benefit from continued skilled OT services in acute setting.     Follow Up Recommendations  Home health OT    Equipment Recommendations  3 in 1 bedside commode    Recommendations for Other Services       Precautions / Restrictions Precautions Precautions: Fall Restrictions Weight Bearing Restrictions: No      Mobility Bed Mobility Overal bed mobility: Modified Independent                Transfers Overall transfer level: Modified  independent Equipment used: None                  Balance Overall balance assessment: Needs assistance   Sitting balance-Leahy Scale: Good       Standing balance-Leahy Scale: Fair                             ADL either performed or assessed with clinical judgement   ADL Overall ADL's : Needs assistance/impaired Eating/Feeding: Modified independent Eating/Feeding Details (indicate cue type and reason): no teeth  Grooming: Wash/dry face;Wash/dry hands   Upper Body Bathing: Set up   Lower Body Bathing: Set up   Upper Body Dressing : Set up   Lower Body Dressing: Set up   Toilet Transfer: Supervision/safety   Toileting- Clothing Manipulation and Hygiene: Supervision/safety       Functional mobility during ADLs: Supervision/safety       Vision Baseline Vision/History: No visual deficits Patient Visual Report: No change from baseline       Perception     Praxis      Pertinent Vitals/Pain Pain Assessment: No/denies pain     Hand Dominance Right   Extremity/Trunk Assessment Upper Extremity Assessment Upper Extremity Assessment: LUE deficits/detail;RUE deficits/detail;Generalized weakness RUE: (Grossly 3+/5 for B UE, AROM WFL for all planes ) LUE: (Grossly 3+/5 for B UE, AROM WFL for all planes)   Lower Extremity Assessment Lower Extremity Assessment: Defer to PT evaluation       Communication Communication Communication: No difficulties   Cognition Arousal/Alertness: Awake/alert Behavior  During Therapy: WFL for tasks assessed/performed Overall Cognitive Status: No family/caregiver present to determine baseline cognitive functioning                                     General Comments       Exercises Other Exercises Other Exercises: flutter valve x 3 w cues Other Exercises: incentive spirometer x3 pulls 214ml max   Shoulder Instructions      Home Living Family/patient expects to be discharged to:: Private  residence Living Arrangements: Children;Other (Comment) Available Help at Discharge: Other (Comment);Family Type of Home: Other(Comment) Home Access: Stairs to enter [employer] of Steps: just a few   Home Layout: One level     Bathroom Shower/Tub: Tub/shower unit;Walk-in shower         Home Equipment: Gilford Rile - 2 wheels;Cane - single point;Tub bench          Prior Functioning/Environment Level of Independence: Independent                 OT Problem List: Decreased strength;Decreased activity tolerance;Impaired balance (sitting and/or standing);Cardiopulmonary status limiting activity;Decreased knowledge of use of DME or AE      OT Treatment/Interventions: Self-care/ADL training;Therapeutic exercise;Energy conservation;DME and/or AE instruction;Balance training;Patient/family education;Therapeutic activities    OT Goals(Current goals can be found in the care plan section) Acute Rehab OT Goals Patient Stated Goal: wants to go home OT Goal Formulation: With patient Time For Goal Achievement: 07/22/19 Potential to Achieve Goals: Good  OT Frequency: Min 3X/week   Barriers to D/C:            Co-evaluation              AM-PAC OT "6 Clicks" Daily Activity     Outcome Measure Help from another person eating meals?: None Help from another person taking care of personal grooming?: A Little Help from another person toileting, which includes using toliet, bedpan, or urinal?: A Little Help from another person bathing (including washing, rinsing, drying)?: A Little Help from another person to put on and taking off regular upper body clothing?: A Little Help from another person to put on and taking off regular lower body clothing?: A Little 6 Click Score: 19   End of Session Equipment Utilized During Treatment: Oxygen  Activity Tolerance: Patient limited by fatigue Patient left: in chair;with call bell/phone within reach;with chair alarm set  OT Visit  Diagnosis: Muscle weakness (generalized) (M62.81);Unsteadiness on feet (R26.81)                Time: [id number] OT Time Calculation (min): 39 min Charges:  OT General Charges $OT Visit: 1 Visit OT Evaluation $OT Eval Moderate Complexity: 1 Mod OT Treatments $Self Care/Home Management : 8-22 mins $Therapeutic Exercise: 8-22 mins  Abigail Castillo OTR/L   Abigail Castillo 07/08/2019, 4:38 PM

## 2019-07-08 NOTE — Evaluation (Signed)
Physical Therapy Evaluation Patient Details Name: Abigail Castillo MRN: [id number] DOB: 1950-07-14 Today's Date: 07/08/2019   History of Present Illness  69 y.o. female with history of COPD schizoaffective disorder was exposed to COVID-19 infection about 2/1 and started getting more short of breath last few days.  Also has had 2-3 episodes diarrhea last few days. She was admitted for COPD exacerbation and Covid pneumonia.  Also noted to be hypoxic initially requiring 2 to 3 L nasal cannula to keep oxygen saturations greater than 90%.  Patient admitted to the hospitalist service and started on IV steroids and Remdisivir and transferred to [facility].  Clinical Impression   Pt has been admitted with above hx and above dx. She reports she was living at home with son and was quite independent and did not use any AD. She does own some DME/AD at home. She reports that her son is currently out of town but she has a daughter she can stay with at d/c. This am pt is moving quite well she is at mod I level with bed mob and transfers and was able to ambulate in room approx 59ft with SBA, pt was on 2L/min and after ambulation 43ft became quite winded and desat to 82%. Once resting in bed and completing breathing able to recover to low 90s within 2 mins. Pt has been reinforced on use of incentive spirometer and also flutter valve. Pt will benefit from continued PT tx while in hospital to address declines in overall strength, balance and coordination and activity tolerance. At d/c pt will not need further PT f/u.     Follow Up Recommendations No PT follow up    Equipment Recommendations  None recommended by PT    Recommendations for Other Services       Precautions / Restrictions Precautions Precautions: Fall Restrictions Weight Bearing Restrictions: No      Mobility  Bed Mobility Overal bed mobility: Modified Independent                Transfers Overall transfer level: Modified  independent Equipment used: None                Ambulation/Gait Ambulation/Gait assistance: Supervision Gait Distance (Feet): 30 Feet Assistive device: None Gait Pattern/deviations: Step-through pattern Gait velocity: decr   General Gait Details: ambulated 28ft on 2L/min desat to 82%% able to reover within 2 min to low 90s.  Stairs            Wheelchair Mobility    Modified Rankin (Stroke Patients Only)       Balance Overall balance assessment: Mild deficits observed, not formally tested                                           Pertinent Vitals/Pain Pain Assessment: No/denies pain    Home Living Family/patient expects to be discharged to:: Private residence Living Arrangements: Children;Other (Comment)(but son is currently out of town hence nobody at home) Available Help at Discharge: Other (Comment);Family Type of Home: Other(Comment)([place]) Home Access: Stairs to enter   Entrance Stairs-Number of Steps: just a few Home Layout: One level Home Equipment: Walker - 2 wheels;Cane - single point;Tub bench Additional Comments: Pt give convoluted hx she states she [place], but son is out of town currently and unknown when he will return but there is a daughter  who may pick pt up if she is to d/c soon    Prior Function Level of Independence: Independent               Hand Dominance   Dominant Hand: Right    Extremity/Trunk Assessment   Upper Extremity Assessment Upper Extremity Assessment: Overall WFL for tasks assessed    Lower Extremity Assessment Lower Extremity Assessment: Overall WFL for tasks assessed       Communication   Communication: No difficulties  Cognition Arousal/Alertness: Awake/alert Behavior During Therapy: WFL for tasks assessed/performed Overall Cognitive Status: No family/caregiver present to determine baseline cognitive functioning                                         General Comments      Exercises Other Exercises Other Exercises: flutter valve x 5 w cues Other Exercises: incentive spirometer x5 pulls 265ml max   Assessment/Plan    PT Assessment Patient needs continued PT services  PT Problem List Decreased strength;Decreased activity tolerance;Decreased coordination;Decreased safety awareness       PT Treatment Interventions Stair training;Gait training;DME instruction;Functional mobility training;Therapeutic activities;Therapeutic exercise;Balance training;Neuromuscular re-education;Patient/family education    PT Goals (Current goals can be found in the Care Plan section)  Acute Rehab PT Goals Patient Stated Goal: wants to go home PT Goal Formulation: With patient Time For Goal Achievement: 07/22/19 Potential to Achieve Goals: Fair    Frequency Min 3X/week   Barriers to discharge Other (comment);Decreased caregiver support      Co-evaluation               AM-PAC PT "6 Clicks" Mobility  Outcome Measure Help needed turning from your back to your side while in a flat bed without using bedrails?: None Help needed moving from lying on your back to sitting on the side of a flat bed without using bedrails?: None Help needed moving to and from a bed to a chair (including a wheelchair)?: None Help needed standing up from a chair using your arms (e.g., wheelchair or bedside chair)?: None Help needed to walk in hospital room?: A Little Help needed climbing 3-5 steps with a railing? : A Little 6 Click Score: 22    End of Session Equipment Utilized During Treatment: Oxygen Activity Tolerance: Patient limited by fatigue;Patient limited by lethargy;Treatment limited secondary to medical complications (Comment) Patient left: in bed;with call bell/phone within reach Nurse Communication: Mobility status PT Visit Diagnosis: Other abnormalities of gait and mobility (R26.89)    Time: [id number] PT Time Calculation (min)  (ACUTE ONLY): 16 min   Charges:   PT Evaluation $PT Eval Moderate Complexity: [employer], PT   Delford Field 07/08/2019, 12:54 PM

## 2019-07-08 NOTE — Progress Notes (Signed)
PROGRESS NOTE  Abigail Castillo [id number] DOB: [birth year] DOA: 07/05/2019 PCP: [name], NP  HPI/Recap of past 41 hours: 69 year old female with past medical history of schizoaffective disorder and COPD on 2 L oxygen at night who is had progressively worsening shortness of breath for the past few days plus diarrhea and presented to the emergency room on the night of 2/9.  Noted to have an elevated CRP level and positive Covid test.  She was admitted for COPD exacerbation and Covid pneumonia.  Also noted to be hypoxic initially requiring 2 to 3 L nasal cannula to keep oxygen saturations greater than 90%.  Patient admitted to the hospitalist service and started on IV steroids and Remdisivir and transferred to [facility].  Each day patient continues to breathe a little bit easier.  Still requiring oxygen, especially with ambulation.  Assessment/Plan: Principal Problem:   Acute on chronic respiratory failure with hypoxia (HCC) and patient with history of COPD: Patient no longer smokes.  Continue oxygen supplementation, steroids and Remdisivir.  Day 4/5.  Oxygen saturations appear overall stable.  Normal procalcitonin level, so no need for additional IV antibiotics.  CRP now normalized.  Active Problems:   History of tobacco abuse: Quit several years ago.    COPD (chronic obstructive pulmonary disease) (HCC)    Schizoaffective disorder (HCC)/depression: Continue home medications.  Code Status: Full code  Family Communication: Updated daughter by phone  Disposition Plan: Discharge home likely tomorrow after last dose of remdesivir.  May end up needing home oxygen (currently just on at night).  Patient's daughter has been ill and patient does not drive, so she would not be able to come to the infusion center for outpatient Remdisivir.  Consultants:  None  Procedures:  None  Antimicrobials:  IV Remdisivir 2/9-present  DVT prophylaxis: Lovenox   Objective: Vitals:    07/08/19 0756 07/08/19 1200  BP: 116/79 (!) 132/92  Pulse: 95   Resp:    Temp: 98.6 F (37 C) 98.7 F (37.1 C)  SpO2:      Intake/Output Summary (Last 24 hours) at 07/08/2019 1319 Last data filed at 07/07/2019 2000 Gross per 24 hour  Intake 754.94 ml  Output --  Net 754.94 ml   Filed Weights   07/07/19 1600  Weight: 62 kg   Body mass index is 24.98 kg/m.  Exam:   General: Alert and oriented x3, no acute distress  HEENT: Normocephalic and atraumatic, mucous membranes are moist  Cardiovascular: Regular rate and rhythm, S1-S2  Respiratory: Decreased breath sounds throughout, better airway exchange  Abdomen: Soft, nontender, nondistended, positive bowel sounds  Musculoskeletal: No clubbing or cyanosis or edema  Skin: No skin breaks, tears or lesions  Psychiatry: Appropriate, no evidence of psychoses   Data Reviewed: COVID-19 Labs  Recent Labs    07/05/19 1935 07/07/19 0852 07/08/19 0204  DDIMER 1.16* 0.69* 0.61*  FERRITIN 88 89 64  LDH 190  --   --   CRP 3.5*  --  0.8    No results found for: SARSCOV2NAA   CBC: Recent Labs  Lab 07/05/19 1817 07/06/19 0232 07/07/19 0852 07/08/19 0204  WBC 6.2 3.0* 7.6 10.6*  NEUTROABS 4.6  --   --   --   HGB 15.5* 14.3 13.8 12.9  HCT 48.6* 45.7 44.0 39.5  MCV 95.5 96.0 96.3 95.4  PLT 293 246 283 [id number]   Basic Metabolic Panel: Recent Labs  Lab 07/05/19 1817 07/06/19 0232 07/07/19 0852 07/08/19 0204  NA 141  --  142 142  K 3.6  --  3.5 3.4*  CL 102  --  102 103  CO2 23  --  30 29  GLUCOSE 133*  --  186* 156*  BUN 16  --  30* 28*  CREATININE 0.66 0.94 0.70 0.67  CALCIUM 9.2  --  9.3 8.8*   GFR: Estimated Creatinine Clearance: 59.1 mL/min (by C-G formula based on SCr of 0.67 mg/dL). Liver Function Tests: Recent Labs  Lab 07/05/19 1817 07/07/19 0852 07/08/19 0204  AST 24 17 16   ALT 22 20 18   ALKPHOS 90 84 81  BILITOT 2.2* 1.1 0.4  PROT 7.3 7.1 6.2*  ALBUMIN 3.8 3.7 3.2*   No results for  input(s): LIPASE, AMYLASE in the last 168 hours. No results for input(s): AMMONIA in the last 168 hours. Coagulation Profile: No results for input(s): INR, PROTIME in the last 168 hours. Cardiac Enzymes: No results for input(s): CKTOTAL, CKMB, CKMBINDEX, TROPONINI in the last 168 hours. BNP (last 3 results) No results for input(s): PROBNP in the last 8760 hours. HbA1C: No results for input(s): HGBA1C in the last 72 hours. CBG: No results for input(s): GLUCAP in the last 168 hours. Lipid Profile: Recent Labs    07/05/19 1935  TRIG 94   Thyroid Function Tests: No results for input(s): TSH, T4TOTAL, FREET4, T3FREE, THYROIDAB in the last 72 hours. Anemia Panel: Recent Labs    07/07/19 0852 07/08/19 0204  FERRITIN 89 64   Urine analysis:    Component Value Date/Time   COLORURINE AMBER (A) 07/06/2019 0712   APPEARANCEUR HAZY (A) 07/06/2019 0712   LABSPEC 1.034 (H) 07/06/2019 0712   PHURINE 5.0 07/06/2019 0712   GLUCOSEU 50 (A) 07/06/2019 0712   GLUCOSEU NEGATIVE 11/11/2017 1027   HGBUR NEGATIVE 07/06/2019 0712   BILIRUBINUR NEGATIVE 07/06/2019 0712   KETONESUR 20 (A) 07/06/2019 0712   PROTEINUR 30 (A) 07/06/2019 0712   UROBILINOGEN 0.2 11/11/2017 1027   NITRITE NEGATIVE 07/06/2019 0712   LEUKOCYTESUR NEGATIVE 07/06/2019 0712   Sepsis Labs: @LABRCNTIP (procalcitonin:4,lacticidven:4)  ) Recent Results (from the past 240 hour(s))  Blood Culture (routine x 2)     Status: None (Preliminary result)   Collection Time: 07/05/19  7:20 PM   Specimen: BLOOD RIGHT WRIST  Result Value Ref Range Status   Specimen Description BLOOD RIGHT WRIST  Final   Special Requests   Final    BOTTLES DRAWN AEROBIC AND ANAEROBIC Blood Culture adequate volume   Culture   Final    NO GROWTH 3 DAYS Performed at [facility], [place] [address] [place], [place] [address]    Report Status PENDING  Incomplete  Blood Culture (routine x 2)     Status: None (Preliminary result)   Collection Time:  07/05/19  7:25 PM   Specimen: BLOOD  Result Value Ref Range Status   Specimen Description BLOOD LEFT ANTECUBITAL  Final   Special Requests   Final    BOTTLES DRAWN AEROBIC AND ANAEROBIC Blood Culture adequate volume   Culture   Final    NO GROWTH 3 DAYS Performed at [facility], [place] [address], [place], [place] [address]    Report Status PENDING  Incomplete      Studies: No results found.  Scheduled Meds: . albuterol  8 puff Inhalation Q4H  . enoxaparin (LOVENOX) injection  40 mg Subcutaneous QHS  . methylPREDNISolone (SOLU-MEDROL) injection  40 mg Intravenous Q12H  . OLANZapine  7.5 mg Oral Daily  . pantoprazole  40 mg Oral Daily  . vitamin B-12  1,000 mcg Oral Daily    Continuous Infusions: . remdesivir 100 mg in NS 100 mL 100 mg (07/08/19 0813)     LOS: 3 days     Annita Brod, MD [facility]   07/08/2019, 1:19 PM

## 2019-07-08 NOTE — Plan of Care (Signed)
  Problem: Education: Goal: Knowledge of risk factors and measures for prevention of condition will improve Outcome: Progressing   Problem: Coping: Goal: Psychosocial and spiritual needs will be supported Outcome: Progressing   Problem: Respiratory: Goal: Will maintain a patent airway Outcome: Progressing Goal: Complications related to the disease process, condition or treatment will be avoided or minimized Outcome: Progressing   

## 2019-07-09 LAB — C-REACTIVE PROTEIN: CRP: 0.7 mg/dL (ref <1.0)

## 2019-07-09 NOTE — Plan of Care (Signed)
Pt A&Ox4. VSS, SpO2 >88% on 2LNC. No c/o pain throughout shift. Utilizing [facility] to void.  Bath completed.   All safety measures in place, will report to oncoming shift.  Tomie China    Problem: Education: Goal: Knowledge of risk factors and measures for prevention of condition will improve Outcome: Progressing   Problem: Coping: Goal: Psychosocial and spiritual needs will be supported Outcome: Progressing   Problem: Respiratory: Goal: Will maintain a patent airway Outcome: Progressing Goal: Complications related to the disease process, condition or treatment will be avoided or minimized Outcome: Progressing
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
## 2019-07-09 NOTE — Progress Notes (Signed)
Physical Therapy Treatment Patient Details Name: Abigail Castillo MRN: [id number] DOB: [birth year] Today's Date: 07/09/2019    History of Present Illness 69 y.o. female with history of COPD schizoaffective disorder was exposed to COVID-19 infection about 2/1 and started getting more short of breath last few days.  Also has had 2-3 episodes diarrhea last few days. She was admitted for COPD exacerbation and Covid pneumonia.  Also noted to be hypoxic initially requiring 2 to 3 L nasal cannula to keep oxygen saturations greater than 90%.  Patient admitted to the hospitalist service and started on IV steroids and Remdisivir and transferred to [facility].    PT Comments    Pt is making progress with tx, she is able to tolerate more mobility. Pt has been educated on need to continue with breathing exercises but also to ambulate around room as able. She is at SBA/mod I with all functional mobility but needs more more mobility as she fatigues very quickly. While ambulating in hall monitors registered sats at 77% and began alarming, pt became anxious at this and also started showing visible sx, but once alarm corrected and monitor was showing sats in 90s again pt seemed to also calm down. She does not seem to be desating but does fatigue quickly and gets anxious about not having enough air. Have discussed this with pt and need to increase mobility throughout day to increase activity tolerance.     Follow Up Recommendations  No PT follow up     Equipment Recommendations  None recommended by PT    Recommendations for Other Services       Precautions / Restrictions Precautions Precautions: Fall Restrictions Weight Bearing Restrictions: No    Mobility  Bed Mobility Overal bed mobility: Modified Independent                Transfers Overall transfer level: Modified independent Equipment used: None                Ambulation/Gait Ambulation/Gait assistance: Min guard Gait Distance  (Feet): 120 Feet Assistive device: None Gait Pattern/deviations: Step-through pattern Gait velocity: decr   General Gait Details: ambulated on 2L/min via [place], desat to 77% on monitor but this was not correct reading as when probe moved sats immediately increased. Pt became anxious at monitors beeping and hence unable to continue with ambulation   Stairs             Wheelchair Mobility    Modified Rankin (Stroke Patients Only)       Balance Overall balance assessment: Needs assistance Sitting-balance support: Feet supported Sitting balance-Leahy Scale: Good     Standing balance support: During functional activity;Single extremity supported Standing balance-Leahy Scale: Fair                              Cognition Arousal/Alertness: Awake/alert Behavior During Therapy: Impulsive Overall Cognitive Status: No family/caregiver present to determine baseline cognitive functioning                                        Exercises Other Exercises Other Exercises: incentive spirometer x 10 pulls 580ml Other Exercises: flutter valve x 10    General Comments        Pertinent Vitals/Pain Pain Assessment: No/denies pain    Home Living  Prior Function            PT Goals (current goals can now be found in the care plan section) Acute Rehab PT Goals Patient Stated Goal: wants to go home PT Goal Formulation: With patient Time For Goal Achievement: 07/22/19 Potential to Achieve Goals: Fair Progress towards PT goals: Progressing toward goals    Frequency    Min 3X/week      PT Plan Current plan remains appropriate    Co-evaluation              AM-PAC PT "6 Clicks" Mobility   Outcome Measure  Help needed turning from your back to your side while in a flat bed without using bedrails?: None Help needed moving from lying on your back to sitting on the side of a flat bed without using bedrails?:  None Help needed moving to and from a bed to a chair (including a wheelchair)?: None Help needed standing up from a chair using your arms (e.g., wheelchair or bedside chair)?: None Help needed to walk in hospital room?: A Little Help needed climbing 3-5 steps with a railing? : A Little 6 Click Score: 22    End of Session Equipment Utilized During Treatment: Oxygen Activity Tolerance: Patient limited by fatigue;Patient limited by lethargy;Treatment limited secondary to medical complications (Comment) Patient left: in bed;with call bell/phone within reach Nurse Communication: Mobility status PT Visit Diagnosis: Other abnormalities of gait and mobility (R26.89)     Time: [id number] PT Time Calculation (min) (ACUTE ONLY): 18 min  Charges:  $Gait Training: 8-22 mins                     Horald Chestnut, PT    Delford Field 07/09/2019, 4:39 PM

## 2019-07-10 LAB — CULTURE, BLOOD (ROUTINE X 2)
Culture: NO GROWTH
Culture: NO GROWTH
Special Requests: ADEQUATE
Special Requests: ADEQUATE

## 2019-07-10 MED ORDER — PREDNISONE 10 MG PO TABS: ORAL_TABLET | ORAL | 0 refills | Status: AC

## 2019-07-10 NOTE — Discharge Instructions (Addendum)
You need to stay in isolation and are still contagious until 3/2.  You can take the Covid vaccine after 3/26     COVID-19 Frequently Asked Questions COVID-19 (coronavirus disease) is an infection that is caused by a large family of viruses. Some viruses cause illness in people and others cause illness in animals like camels, cats, and bats. In some cases, the viruses that cause illness in animals can spread to humans. Where did the coronavirus come from? In December 2019, [place] told the [facility]) of several cases of lung disease (human respiratory illness). These cases were linked to an open seafood and livestock market in the city of [place]. The link to the seafood and livestock market suggests that the virus may have spread from animals to humans. However, since that first outbreak in December, the virus has also been shown to spread from person to person. What is the name of the disease and the virus? Disease name Early on, this disease was called novel coronavirus. This is because scientists determined that the disease was caused by a new (novel) respiratory virus. The World Health Organization [facility]) has now named the disease COVID-19, or coronavirus disease. Virus name The virus that causes the disease is called severe acute respiratory syndrome coronavirus 2 (SARS-CoV-2). More information on disease and virus naming World Health Organization [facility]): www.who.int/emergencies/diseases/novel-coronavirus-2019/technical-guidance/naming-the-coronavirus-disease-(covid-2019)-and-the-virus-that-causes-it Who is at risk for complications from coronavirus disease? Some people may be at higher risk for complications from coronavirus disease. This includes older adults and people who have chronic diseases, such as heart disease, diabetes, and lung disease. If you are at higher risk for complications, take these extra precautions:  Stay home as much as possible.  Avoid social  gatherings and travel.  Avoid close contact with others. Stay at least 6 ft (2 m) away from others, if possible.  Wash your hands often with soap and water for at least 20 seconds.  Avoid touching your face, mouth, nose, or eyes.  Keep supplies on hand at home, such as food, medicine, and cleaning supplies.  If you must go out in public, wear a cloth face covering or face mask. Make sure your mask covers your nose and mouth. How does coronavirus disease spread? The virus that causes coronavirus disease spreads easily from person to person (is contagious). You may catch the virus by:  Breathing in droplets from an infected person. Droplets can be spread by a person breathing, speaking, singing, coughing, or sneezing.  Touching something, like a table or a doorknob, that was exposed to the virus (contaminated) and then touching your mouth, nose, or eyes. Can I get the virus from touching surfaces or objects? There is still a lot that we do not know about the virus that causes coronavirus disease. Scientists are basing a lot of information on what they know about similar viruses, such as:  Viruses cannot generally survive on surfaces for long. They need a human body (host) to survive.  It is more likely that the virus is spread by close contact with people who are sick (direct contact), such as through: ? Shaking hands or hugging. ? Breathing in respiratory droplets that travel through the air. Droplets can be spread by a person breathing, speaking, singing, coughing, or sneezing.  It is less likely that the virus is spread when a person touches a surface or object that has the virus on it (indirect contact). The virus may be able to enter the body if the person touches a surface  or object and then touches his or her face, eyes, nose, or mouth. Can a person spread the virus without having symptoms of the disease? It may be possible for the virus to spread before a person has symptoms of the  disease, but this is most likely not the main way the virus is spreading. It is more likely for the virus to spread by being in close contact with people who are sick and breathing in the respiratory droplets spread by a person breathing, speaking, singing, coughing, or sneezing. What are the symptoms of coronavirus disease? Symptoms vary from person to person and can range from mild to severe. Symptoms may include:  Fever or chills.  Cough.  Difficulty breathing or feeling short of breath.  Headaches, body aches, or muscle aches.  Runny or stuffy (congested) nose.  Sore throat.  New loss of taste or smell.  Nausea, vomiting, or diarrhea. These symptoms can appear anywhere from 2 to 14 days after you have been exposed to the virus. Some people may not have any symptoms. If you develop symptoms, call your health care provider. People with severe symptoms may need hospital care. Should I be tested for this virus? Your health care provider will decide whether to test you based on your symptoms, history of exposure, and your risk factors. How does a health care provider test for this virus? Health care providers will collect samples to send for testing. Samples may include:  Taking a swab of fluid from the back of your nose and throat, your nose, or your throat.  Taking fluid from the lungs by having you cough up mucus (sputum) into a sterile cup.  Taking a blood sample. Is there a treatment or vaccine for this virus? Currently, there is no vaccine to prevent coronavirus disease. Also, there are no medicines like antibiotics or antivirals to treat the virus. A person who becomes sick is given supportive care, which means rest and fluids. A person may also relieve his or her symptoms by using over-the-counter medicines that treat sneezing, coughing, and runny nose. These are the same medicines that a person takes for the common cold. If you develop symptoms, call your health care provider.  People with severe symptoms may need hospital care. What can I do to protect myself and my family from this virus?     You can protect yourself and your family by taking the same actions that you would take to prevent the spread of other viruses. Take the following actions:  Wash your hands often with soap and water for at least 20 seconds. If soap and water are not available, use alcohol-based hand sanitizer.  Avoid touching your face, mouth, nose, or eyes.  Cough or sneeze into a tissue, sleeve, or elbow. Do not cough or sneeze into your hand or the air. ? If you cough or sneeze into a tissue, throw it away immediately and wash your hands.  Disinfect objects and surfaces that you frequently touch every day.  Stay away from people who are sick.  Avoid going out in public, follow guidance from your state and local health authorities.  Avoid crowded indoor spaces. Stay at least 6 ft (2 m) away from others.  If you must go out in public, wear a cloth face covering or face mask. Make sure your mask covers your nose and mouth.  Stay home if you are sick, except to get medical care. Call your health care provider before you get medical care. Your health  care provider will tell you how long to stay home.  Make sure your vaccines are up to date. Ask your health care provider what vaccines you need. What should I do if I need to travel? Follow travel recommendations from your local health authority, the CDC, and WHO. Travel information and advice  Centers for Disease Control and Prevention (CDC): BodyEditor.hu  World Health Organization [facility]): ThirdIncome.ca Know the risks and take action to protect your health  You are at higher risk of getting coronavirus disease if you are traveling to areas with an outbreak or if you are exposed to travelers from areas with an outbreak.  Wash your hands  often and practice good hygiene to lower the risk of catching or spreading the virus. What should I do if I am sick? General instructions to stop the spread of infection  Wash your hands often with soap and water for at least 20 seconds. If soap and water are not available, use alcohol-based hand sanitizer.  Cough or sneeze into a tissue, sleeve, or elbow. Do not cough or sneeze into your hand or the air.  If you cough or sneeze into a tissue, throw it away immediately and wash your hands.  Stay home unless you must get medical care. Call your health care provider or local health authority before you get medical care.  Avoid public areas. Do not take public transportation, if possible.  If you can, wear a mask if you must go out of the house or if you are in close contact with someone who is not sick. Make sure your mask covers your nose and mouth. Keep your home clean  Disinfect objects and surfaces that are frequently touched every day. This may include: ? Counters and tables. ? Doorknobs and light switches. ? Sinks and faucets. ? Electronics such as phones, remote controls, keyboards, computers, and tablets.  Wash dishes in hot, soapy water or use a dishwasher. Air-dry your dishes.  Wash laundry in hot water. Prevent infecting other household members  Let healthy household members care for children and pets, if possible. If you have to care for children or pets, wash your hands often and wear a mask.  Sleep in a different bedroom or bed, if possible.  Do not share personal items, such as razors, toothbrushes, deodorant, combs, brushes, towels, and washcloths. Where to find more information Centers for Disease Control and Prevention (CDC)  Information and news updates: https://www.butler-gonzalez.com/ World Health Organization [facility])  Information and news updates: MissExecutive.com.ee  Coronavirus health topic:  https://www.castaneda.info/  Questions and answers on COVID-19: OpportunityDebt.at  Global tracker: who.sprinklr.com American Academy of Pediatrics (AAP)  Information for families: www.healthychildren.org/English/health-issues/conditions/chest-lungs/Pages/2019-Novel-Coronavirus.aspx The coronavirus situation is changing rapidly. Check your local health authority website or the CDC and [facility] websites for updates and news. When should I contact a health care provider?  Contact your health care provider if you have symptoms of an infection, such as fever or cough, and you: ? Have been near anyone who is known to have coronavirus disease. ? Have come into contact with a person who is suspected to have coronavirus disease. ? Have traveled to an area where there is an outbreak of COVID-19. When should I get emergency medical care?  Get help right away by calling your local emergency services (911 in the [place] if you have: ? Trouble breathing. ? Pain or pressure in your chest. ? Confusion. ? Blue-tinged lips and fingernails. ? Difficulty waking from sleep. ? Symptoms that get worse. Let the  emergency medical personnel know if you think you have coronavirus disease. Summary  A new respiratory virus is spreading from person to person and causing COVID-19 (coronavirus disease).  The virus that causes COVID-19 appears to spread easily. It spreads from one person to another through droplets from breathing, speaking, singing, coughing, or sneezing.  Older adults and those with chronic diseases are at higher risk of disease. If you are at higher risk for complications, take extra precautions.  There is currently no vaccine to prevent coronavirus disease. There are no medicines, such as antibiotics or antivirals, to treat the virus.  You can protect yourself and your family by washing your hands often, avoiding touching your face, and covering your coughs  and sneezes. This information is not intended to replace advice given to you by your health care provider. Make sure you discuss any questions you have with your health care provider. Document Revised: 03/11/2019 Document Reviewed: 09/07/2018 Elsevier Patient Education  [place].

## 2019-07-10 NOTE — Plan of Care (Signed)
Pt A&Ox4. VSS, SpO2 > 92% on RA. Pt c/o of feeling SOB, 2L [place] added for comfort - MD made aware. Utilizing [facility], tolerating independent transitions well  Bath & complete linen change done  Pt HR elevated after commode use around 1700, sustained HR in 120s. MD notified of acute change. MD asked RN to give PRN ativan in case pt was starting to withdrawal. PRN ativan given x1.   All safety measures in place, will report to oncoming shift  Abigail Castillo A Valeri Sula    Problem: Education: Goal: Knowledge of risk factors and measures for prevention of condition will improve Outcome: Progressing   Problem: Coping: Goal: Psychosocial and spiritual needs will be supported Outcome: Progressing   Problem: Respiratory: Goal: Will maintain a patent airway Outcome: Progressing Goal: Complications related to the disease process, condition or treatment will be avoided or minimized Outcome: Progressing

## 2019-07-10 NOTE — Discharge Summary (Addendum)
Discharge Summary  this d/c summary was reviewed and patient is stable for d/c home no changes to [place] DOB: Sep 05, 1950  PCP: [name], NP  Admit date: 07/05/2019 Anticipated discharge date: 07/10/2019  Time spent: 25 minutes  Recommendations for Outpatient Follow-up:  1. New medication: Prednisone taper 2. Patient is advised to stay in isolation until 3/2, 21 days from her positive Covid test date 3. Patient is advised to take the Covid vaccine when available to her, no earlier than 3/26, 45 days from her positive Covid test date 4. Patient will follow up with her PCP in 1 month 5. Patient will be set up with home health OT +3 and 1 6. Patient will go home on home oxygen 2 L, nasal cannula continuous  Discharge Diagnoses:  Active Hospital Problems   Diagnosis Date Noted  . Acute on chronic respiratory failure with hypoxia ([place]) 07/05/2019  . COVID-19 virus infection 07/06/2019  . Schizoaffective disorder ([place]) 06/30/2016  . B12 deficiency 11/11/2010  . Depression 05/31/2009  . History of tobacco abuse 03/02/2008  . COPD (chronic obstructive pulmonary disease) ([place]) 03/24/2007    Resolved Hospital Problems  No resolved problems to display.    Discharge Condition: Improved, being discharged home  Diet recommendation: Low-sodium  Vitals:   07/09/19 2355 07/10/19 1400  BP: 138/79 (!) 145/91  Pulse: 100 (!) 124  Resp: (!) 22 (!) 38  Temp: 97.9 F (36.6 C) 98.2 F (36.8 C)  SpO2: 100% 100%    History of present illness:  69 year old female with past medical history of schizoaffective disorder and COPD on 2 L oxygen at night who is had progressively worsening shortness of breath for the past few days plus diarrhea and presented to the emergency room on the night of 2/9.  Noted to have an elevated CRP level and positive Covid test.  She was admitted for COPD exacerbation and Covid pneumonia.  Also noted to be hypoxic initially requiring  2 to 3 L nasal cannula to keep oxygen saturations greater than 90%.  Patient admitted to the hospitalist service and started on IV steroids and Remdisivir and transferred to [facility].   Hospital Course:    Acute on chronic respiratory failure with hypoxia (HCC) and patient with history of COPD: Patient no longer smokes.    Patient was continued on steroids, completed 5 days of remdesivir on 2/13 and oxygen.  She was able to be weaned down to 2 L nasal cannula continuous.  When he attempts to ambulate, she becomes extremely tachycardic, tachypneic and oxygen saturations will drop to 87% on room air.  Plan will be for her to go home on home oxygen 2 L.  She was seen by PT who signed off, but OT recommended home health OT.  Normal procalcitonin level, so no need for additional IV antibiotics.  CRP now normalized.  Active Problems:   History of tobacco abuse: Quit several years ago.    COPD (chronic obstructive pulmonary disease) (HCC)    Schizoaffective disorder (HCC)/depression: Continue home medications.  Procedures:  None  Consultations:  None  Discharge Exam: BP (!) 145/91 (BP Location: Left Arm)   Pulse (!) 124   Temp 98.2 F (36.8 C) (Oral)   Resp (!) 38   Ht 5\' 2"  (1.575 m)   Wt 62 kg   SpO2 100%   BMI 24.98 kg/m   General: Alert and oriented x3, no acute distress Cardiovascular: Regular rate and rhythm, S1-S2 Respiratory: Clear to auscultation  bilaterally  Discharge Instructions You were cared for by a hospitalist during your hospital stay. If you have any questions about your discharge medications or the care you received while you were in the hospital after you are discharged, you can call the unit and asked to speak with the hospitalist on call if the hospitalist that took care of you is not available. Once you are discharged, your primary care physician will handle any further medical issues. Please note that NO REFILLS for any discharge medications will be  authorized once you are discharged, as it is imperative that you return to your primary care physician (or establish a relationship with a primary care physician if you do not have one) for your aftercare needs so that they can reassess your need for medications and monitor your lab values.   Allergies as of 07/10/2019      Reactions   Sulfonamide Derivatives Hives      Medication List    STOP taking these medications   meloxicam 7.5 MG tablet Commonly known as: MOBIC     TAKE these medications   AeroChamber MV inhaler Use as instructed   cholecalciferol 1000 units tablet Commonly known as: VITAMIN D Take 3,000 Units by mouth daily.   Dexilant 60 MG capsule Generic drug: dexlansoprazole TAKE ONE CAPSULE BY MOUTH DAILY What changed: how much to take   LORazepam 1 MG tablet Commonly known as: ATIVAN Take 1 mg by mouth 2 (two) times daily as needed for anxiety.   OLANZapine 5 MG tablet Commonly known as: ZYPREXA Take 7.5 mg by mouth daily.   OXYGEN Inhale 2 L into the lungs.   predniSONE 10 MG tablet Commonly known as: DELTASONE Take 5 tablets (50 mg total) by mouth daily for 1 day, THEN 4 tablets (40 mg total) daily for 1 day, THEN 3 tablets (30 mg total) daily for 1 day, THEN 2 tablets (20 mg total) daily for 1 day, THEN 1 tablet (10 mg total) daily for 1 day. Start taking on: July 10, 2019   Stiolto Respimat 2.5-2.5 MCG/ACT Aers Generic drug: Tiotropium Bromide-Olodaterol INHALE TWO PUFFS INTO THE LUNGS DAILY What changed: See the new instructions.   Ventolin HFA 108 (90 Base) MCG/ACT inhaler Generic drug: albuterol INHALE TWO PUFFS BY MOUTH INTO THE LUNGS EVERY 6 HOURS AS NEEDED FOR WHEEZING OR SHORTNESS OF BREATH What changed: See the new instructions.   vitamin B-12 1000 MCG tablet Commonly known as: CYANOCOBALAMIN TAKE ONE TABLET BY MOUTH DAILY            Durable Medical Equipment  (From admission, onward)         Start     Ordered    07/10/19 1444  For home use only DME 3 n 1  Once     07/10/19 1444   07/10/19 1444  For home use only DME oxygen  Once    Question Answer Comment  Length of Need 6 Months   Mode or (Route) Nasal cannula   Liters per Minute 2   Frequency Continuous (stationary and portable oxygen unit needed)   Oxygen delivery system Gas      07/10/19 1444         Allergies  Allergen Reactions  . Sulfonamide Derivatives Hives   Follow-up Information    [name], NP Follow up in 1 month(s).   Specialty: Internal Medicine Contact information: [address] [place] [address] [phone]  The results of significant diagnostics from this hospitalization (including imaging, microbiology, ancillary and laboratory) are listed below for reference.    Significant Diagnostic Studies: DG Chest Portable 1 View  Result Date: 07/05/2019 CLINICAL DATA:  Short of breath, diarrhea, weakness EXAM: PORTABLE CHEST 1 VIEW COMPARISON:  03/10/2016 FINDINGS: Single frontal view of the chest demonstrates a stable cardiac silhouette. There is extensive upper lobe predominant bullous emphysema. Severe bibasilar scarring and fibrosis again noted. No airspace disease, effusion, or pneumothorax. No acute bony abnormality. IMPRESSION: 1. Stable severe emphysema.  No acute process. Electronically Signed   By: Randa Ngo M.D.   On: 07/05/2019 19:16    Microbiology: Recent Results (from the past 240 hour(s))  Blood Culture (routine x 2)     Status: None (Preliminary result)   Collection Time: 07/05/19  7:20 PM   Specimen: BLOOD RIGHT WRIST  Result Value Ref Range Status   Specimen Description BLOOD RIGHT WRIST  Final   Special Requests   Final    BOTTLES DRAWN AEROBIC AND ANAEROBIC Blood Culture adequate volume   Culture   Final    NO GROWTH 4 DAYS Performed at [facility], [address] [place], [place] [address]    Report Status PENDING  Incomplete  Blood  Culture (routine x 2)     Status: None (Preliminary result)   Collection Time: 07/05/19  7:25 PM   Specimen: BLOOD  Result Value Ref Range Status   Specimen Description BLOOD LEFT ANTECUBITAL  Final   Special Requests   Final    BOTTLES DRAWN AEROBIC AND ANAEROBIC Blood Culture adequate volume   Culture   Final    NO GROWTH 4 DAYS Performed at [facility], [address], [place], [place] [address]    Report Status PENDING  Incomplete     Labs: Basic Metabolic Panel: Recent Labs  Lab 07/05/19 1817 07/06/19 0232 07/07/19 0852 07/08/19 0204  NA 141  --  142 142  K 3.6  --  3.5 3.4*  CL 102  --  102 103  CO2 23  --  30 29  GLUCOSE 133*  --  186* 156*  BUN 16  --  30* 28*  CREATININE 0.66 0.94 0.70 0.67  CALCIUM 9.2  --  9.3 8.8*   Liver Function Tests: Recent Labs  Lab 07/05/19 1817 07/07/19 0852 07/08/19 0204  AST 24 17 16   ALT 22 20 18   ALKPHOS 90 84 81  BILITOT 2.2* 1.1 0.4  PROT 7.3 7.1 6.2*  ALBUMIN 3.8 3.7 3.2*   No results for input(s): LIPASE, AMYLASE in the last 168 hours. No results for input(s): AMMONIA in the last 168 hours. CBC: Recent Labs  Lab 07/05/19 1817 07/06/19 0232 07/07/19 0852 07/08/19 0204  WBC 6.2 3.0* 7.6 10.6*  NEUTROABS 4.6  --   --   --   HGB 15.5* 14.3 13.8 12.9  HCT 48.6* 45.7 44.0 39.5  MCV 95.5 96.0 96.3 95.4  PLT 293 246 283 303   Cardiac Enzymes: No results for input(s): CKTOTAL, CKMB, CKMBINDEX, TROPONINI in the last 168 hours. BNP: BNP (last 3 results) No results for input(s): BNP in the last 8760 hours.  ProBNP (last 3 results) No results for input(s): PROBNP in the last 8760 hours.  CBG: No results for input(s): GLUCAP in the last 168 hours.     Signed:  Annita Brod, MD [facility] 07/10/2019, 2:46 PM

## 2019-07-11 ENCOUNTER — Telehealth: Payer: Self-pay | Admitting: *Deleted

## 2019-07-11 DIAGNOSIS — J9621 Acute and chronic respiratory failure with hypoxia: Secondary | ICD-10-CM

## 2019-07-11 NOTE — Progress Notes (Signed)
Patient seen examined and stable at this time  Main barrier to d/c is lack of electricity at home Spoke w patient daugther as well who doesn't have power andf is running a generator As she requires oxygen will need to ensure safe d/c plan prior to d/c home   No charge  Verneita Griffes, MD [facility] 8:38 AM

## 2019-07-11 NOTE — Progress Notes (Signed)
Physical Therapy Treatment Patient Details Name: Abigail Castillo MRN: [id number] DOB: 10/12/1950 Today's Date: 07/11/2019    History of Present Illness 69 y.o. female with history of COPD schizoaffective disorder was exposed to COVID-19 infection about 2/1 and started getting more short of breath last few days.  Also has had 2-3 episodes diarrhea last few days. She was admitted for COPD exacerbation and Covid pneumonia.  Also noted to be hypoxic initially requiring 2 to 3 L nasal cannula to keep oxygen saturations greater than 90%.  Patient admitted to the hospitalist service and started on IV steroids and Remdisivir and transferred to [facility].    PT Comments    Patient on room air during session. Recovers quickly after mobility (in less than 30 seconds). HR high (in 120s bpm) during session. She used suppl oxygen as needed at home, mostly at night, per patient report. PMH: COPD. Patient ambulates with slow, grossly steady, gait without device but does have a 4WW she can use if needed upon discharge home. No discharge planned for today per discussion with nurse, as patient still does not have power at her home. Her son is not home so patient would be home alone at times. Patient cannot discharge to her daughter's home either.     07/11/19 0906  Vitals  BP (!) 144/90  BP Location Right Arm  BP Method Automatic  Patient Position (if appropriate) Sitting  Pulse Rate (!) 114  Pulse Rate Source Monitor  Oxygen Therapy  SpO2 100 %  O2 Flow Rate (L/min) 1.5 L/min  Patient Activity (if Appropriate)  (sitting EOB)  Pulse Oximetry Type Continuous                   07/11/19 0918  Vitals  Pulse Rate (!) 128 (ambulating)  Oxygen Therapy  SpO2 93 %  O2 Device Room Air  Patient Activity (if Appropriate) Ambulating       07/11/19 0924  Vitals  Pulse Rate (!) 131 (post ambulation)  Oxygen Therapy  SpO2 (!) 88 %  O2 Device Room Air  Patient Activity (if Appropriate)  (post  ambulation, recovered to 93% in less than 30 sec)   O2 saturation 85% after stair negotiation but quickly increased to >/=87% with seated rest   Follow Up Recommendations  Home health PT     Equipment Recommendations  (patient has 4WW and SPC at home to use as needed)       Precautions / Restrictions Precautions Precautions: Fall Restrictions Weight Bearing Restrictions: No    Mobility  Bed Mobility Overal bed mobility: Modified Independent    General bed mobility comments: HOB elevated, supine>sit modI  Transfers Overall transfer level: Needs assistance Equipment used: None;4-wheeled walker Transfers: Sit to/from Stand Sit to Stand: Supervision;Modified independent (Device/Increase time)    General transfer comment: initially unsteady with first sit>stand from EOB, sit<>stand from recliner chair with 4WW and supervision, sit<>stand from recliner chair modI with more stability on subsequent trial  Ambulation/Gait Ambulation/Gait assistance: Supervision;Modified independent (Device/Increase time)   Assistive device: None;4-wheeled walker Gait Pattern/deviations: Decreased step length - right;Decreased step length - left;Step-through pattern(decreased speed) Gait velocity: decreased   General Gait Details: Patient on room air. Grossly steady without AD for ambulation in room 48ft and 15ft. Use of 4WW for ambulation in hallway, distance limited as patient noting she was cold in the hallway. Patient has a 4WW she can use if needed at home and reports it fits in the home.   Stairs  Stairs assistance: Min assist((due to just one railing present)) Stair Management: One rail Left   General stair comments: Patient declined negotiating further steps. O2 down to 85% post stair negotiation but quickly increased to >/=87% within approx 15 seconds.       Balance     Sitting balance-Leahy Scale: (Good+)     Standing balance support: No upper extremity supported Standing  balance-Leahy Scale: Good Standing balance comment: Improved standing balance as session progressed. Initially unsteady on first stand from EOB.      Cognition Arousal/Alertness: Awake/alert Behavior During Therapy: WFL for tasks assessed/performed     Exercises Other Exercises Other Exercises: flutter x 10 Other Exercises: incentive spirometer x 10 (250)    General Comments          PT Goals (current goals can now be found in the care plan section) Acute Rehab PT Goals Patient Stated Goal: To go home Progress towards PT goals: Progressing toward goals    Frequency    Min 3X/week      PT Plan Discharge plan needs to be updated    AM-PAC PT "6 Clicks" Mobility   Outcome Measure  Help needed turning from your back to your side while in a flat bed without using bedrails?: None Help needed moving from lying on your back to sitting on the side of a flat bed without using bedrails?: None Help needed moving to and from a bed to a chair (including a wheelchair)?: None Help needed standing up from a chair using your arms (e.g., wheelchair or bedside chair)?: None Help needed to walk in hospital room?: A Little Help needed climbing 3-5 steps with a railing? : A Little 6 Click Score: 22    End of Session   Activity Tolerance: Patient tolerated treatment well Patient left: in chair;with chair alarm set Nurse Communication: Mobility status PT Visit Diagnosis: Unsteadiness on feet (R26.81);Other abnormalities of gait and mobility (R26.89);Muscle weakness (generalized) (M62.81)     Time: [id number] PT Time Calculation (min) (ACUTE ONLY): 41 min  Charges:  $Gait Training: 38-52 mins                     Birdie Hopes, DPT, PT Acute Rehab [phone]     Birdie Hopes 07/11/2019, 1:59 PM

## 2019-07-11 NOTE — Progress Notes (Signed)
Occupational Therapy Treatment Patient Details Name: CHRISTINEMARIE ROUCH MRN: [id number] DOB: 07-31-1950 Today's Date: 07/11/2019    History of present illness 69 y.o. female with history of COPD schizoaffective disorder was exposed to COVID-19 infection about 2/1 and started getting more short of breath last few days.  Also has had 2-3 episodes diarrhea last few days. She was admitted for COPD exacerbation and Covid pneumonia.  Also noted to be hypoxic initially requiring 2 to 3 L nasal cannula to keep oxygen saturations greater than 90%.  Patient admitted to the hospitalist service and started on IV steroids and Remdisivir and transferred to [facility].   OT comments  Pt making progress in therapy, demonstrating improved activity tolerance this date. Continued education/instruction with pt on safety strategies and fall prevention techniques with fair understanding. Pt able to ambulate to/from bathroom with RW and variable supervision to min guard to ensure safety with O2 tubing. Noted 0 instances of loss of balance throughout. Pt completed toileting task, able to stand ~1 min at the sink to wash hands. Pt required min cues on body positioning with RW to increase safety with fair understanding and follow through. Pt reported mod shortness of breath during activity. SpO2 maintained in 90s throughout on 2L [place]. Continued education with pt on pursed lip breathing strategies. Educated/instructed pt on energy conservation techniques with handout provided. Began education/instruction with pt on BUE level I theraband HEP with pt requiring mod cues on technique. Recommend continued training. OT will continue to follow acutely.    Follow Up Recommendations  Home health OT;Supervision - Intermittent    Equipment Recommendations  3 in 1 bedside commode    Recommendations for Other Services      Precautions / Restrictions Precautions Precautions: Fall;Other (comment) Precaution Comments: Monitor  SpO2 Restrictions Weight Bearing Restrictions: No       Mobility Bed Mobility Overal bed mobility: Modified Independent             General bed mobility comments: Pt seated in bedside chair upon OT arrival.   Transfers Overall transfer level: Needs assistance Equipment used: Rolling walker (2 wheeled) Transfers: Sit to/from Stand Sit to Stand: Supervision         General transfer comment: Supervision to ensure balance and safety with O2 tubing    Balance Overall balance assessment: Mild deficits observed, not formally tested   Sitting balance-Leahy Scale: (Good+)     Standing balance support: No upper extremity supported Standing balance-Leahy Scale: Good Standing balance comment: Improved standing balance as session progressed. Initially unsteady on first stand from EOB.                           ADL either performed or assessed with clinical judgement   ADL Overall ADL's : Needs assistance/impaired     Grooming: Supervision/safety;Set up;Standing;Wash/dry hands Grooming Details (indicate cue type and reason): While standing at the sink                 Toilet Transfer: Supervision/safety;Ambulation;Regular Toilet;Grab bars   Toileting- Clothing Manipulation and Hygiene: Supervision/safety;Sit to/from stand       Functional mobility during ADLs: Supervision/safety;Min guard General ADL Comments: Pt able to ambulate to/from bathroom with RW. Noted 0 instances of LOB. Supervision to min guard to ensure balance and safety with O2 tubing and RW.      Vision       Quarry manager  Arousal/Alertness: Awake/alert Behavior During Therapy: WFL for tasks assessed/performed Overall Cognitive Status: No family/caregiver present to determine baseline cognitive functioning                                          Exercises Exercises: Other exercises Other Exercises Other Exercises: Encouraged pursed lip  breathing throughout Other Exercises: Educated/instructed pt on BUE level I therband HEP. Pt required mod cues on technique.    Shoulder Instructions       General Comments Pt on 2.5L [place] with SpO2 at 95%. Pt completed mobility and toileting tasks on 2L [place] with SpO2 maintaining in 90s. Pt reported mod SOB during activity.     Pertinent Vitals/ Pain       Pain Assessment: No/denies pain  Home Living                                          Prior Functioning/Environment              Frequency           Progress Toward Goals  OT Goals(current goals can now be found in the care plan section)  Progress towards OT goals: Progressing toward goals  Acute Rehab OT Goals Patient Stated Goal: To go home ADL Goals Pt Will Perform Lower Body Bathing: with modified independence Pt Will Transfer to Toilet: with modified independence Pt/caregiver will Perform Home Exercise Program: Increased strength;Both right and left upper extremity;With written HEP provided Additional ADL Goal #1: Patient will recall and verabilze 3 energy conservation techniques to incorporate in self care tasks with Modified Independence  Plan Discharge plan remains appropriate    Co-evaluation                 AM-PAC OT "6 Clicks" Daily Activity     Outcome Measure   Help from another person eating meals?: None Help from another person taking care of personal grooming?: A Little Help from another person toileting, which includes using toliet, bedpan, or urinal?: A Little Help from another person bathing (including washing, rinsing, drying)?: A Little Help from another person to put on and taking off regular upper body clothing?: A Little Help from another person to put on and taking off regular lower body clothing?: A Little 6 Click Score: 19    End of Session Equipment Utilized During Treatment: Oxygen;Rolling walker  OT Visit Diagnosis: Muscle weakness (generalized)  (M62.81);Unsteadiness on feet (R26.81)   Activity Tolerance Patient limited by fatigue;Other (comment)(Limited by SOB)   Patient Left in chair;with call bell/phone within reach;with chair alarm set   Nurse Communication Mobility status        Time: [id number] OT Time Calculation (min): 25 min  Charges: OT General Charges $OT Visit: 1 Visit OT Treatments $Self Care/Home Management : 8-22 mins $Therapeutic Exercise: 8-22 mins  Mauri Brooklyn OTR/L [phone]   Mauri Brooklyn 07/11/2019, 2:19 PM

## 2019-07-11 NOTE — Progress Notes (Signed)
Patient Saturations on Room Air at Rest = 93%  Patient Saturations on [employer] while Ambulating = 85%  Patient Saturations on 2 Liters of oxygen while Ambulating = 90%

## 2019-07-11 NOTE — Telephone Encounter (Signed)
Received dexilant refill request. Medication is no longer on her med list. Has pantoprazole instead and am unsure if the change was made recently while in hospital?  Please advise.

## 2019-07-11 NOTE — Telephone Encounter (Signed)
LVM for pt to call office to schedule hospital follow up appt.  

## 2019-07-12 NOTE — TOC Transition Note (Signed)
Transition of Care [facility]) - CM/SW Discharge Note   Patient Details  Name: Abigail Castillo MRN: [id number] Date of Birth: 1951/01/30  [facility] of [facility]) CM/SW Contact:  Ross Ludwig, LCSW Phone Number: 07/12/2019, 1:26 PM   Clinical Narrative:    Patient is a 69 year old female she is a new oxygen patient.  Patient will be going home, and staying with her sister after discharge from hospital.  Patient also requested a 3 in 1, CSW spoke to [facility], and they were able to get the oxygen concentrator, and also the 3 in 1 which will be brought to patient's room by the [facility].  CSW was able to make contact of [facility] who will accept patient today.  Patient's daughter was notified about patient discharging today.    Final next level of care: [facility] Barriers to Discharge: Barriers Resolved   Patient Goals and CMS Choice Patient states their goals for this hospitalization and ongoing recovery are:: To return back home with home health. CMS Medicare.gov Compare Post Acute Care list provided to:: Patient Represenative (must comment) Choice offered to / list presented to : Adult Children  Discharge Placement                  Name of family member notified: Patient's daughter Duwaine Maxin [phone] Patient and family notified of of transfer: 07/12/19  Discharge Plan and Services                DME Arranged: 3-N-1, Oxygen DME Agency: [employer] Date DME Agency Contacted: 07/12/19 Time DME Agency Contacted: 80 Representative spoke with at DME Agency: Thedore Mins HH Arranged: PT, OT HH Agency: [facility] [place] Date [place]: 07/12/19 Time [place]: 31 Representative spoke with at [place]: Amy  Social Determinants of Health ([place]) Interventions     Readmission Risk Interventions No flowsheet data found.

## 2019-07-12 NOTE — Progress Notes (Signed)
Occupational Therapy Treatment Patient Details Name: Abigail Castillo MRN: [id number] DOB: 1951/02/20 Today's Date: 07/12/2019    History of present illness 69 year old female with history of COPD and schizoaffective disorder was exposed to COVID-19 infection about 2/1 and started getting more short of breath last few days.  Also has had 2-3 episodes diarrhea last few days. She was admitted for COPD exacerbation and Covid pneumonia.  Also noted to be hypoxic initially requiring 2 to 3 L nasal cannula to keep oxygen saturations greater than 90%.  Patient admitted to the hospitalist service and started on IV steroids and Remdisivir and transferred to [facility].   OT comments  Patient up in chair on arrival and using 2L O2, with resting SpO2 99.  Patient demonstrated some impulsivity during session, moving very quickly through the room and lifting up the walker while walking.  Educated patient on safety with walker and fall prevention strategies.  Patient able to ambulate to bathroom and toilet/groom with supervision.  She was fatigued after standing but not short of breath.  SpO2 remained >93 throughout.  Completed flutter valve and incentive spirometer exercises (pulled 250).  Will continue to follow with OT acutely to address the deficits listed below and improve independence with ADLs..    Follow Up Recommendations  Home health OT;Supervision - Intermittent    Equipment Recommendations  3 in 1 bedside commode    Recommendations for Other Services      Precautions / Restrictions Precautions Precautions: Fall Restrictions Weight Bearing Restrictions: No       Mobility Bed Mobility               General bed mobility comments: Up in chair on arrival  Transfers Overall transfer level: Needs assistance Equipment used: Rolling walker (2 wheeled) Transfers: Sit to/from Stand Sit to Stand: Supervision         .    Balance Overall balance assessment: Mild deficits observed,  not formally tested                                   ADL either performed or assessed with clinical judgement   ADL Overall ADL's : Needs assistance/impaired     Grooming: Wash/dry hands;Wash/dry face;Oral care;Supervision/safety;Standing   Upper Body Bathing: Supervision/ safety;Standing   Lower Body Bathing: Supervison/ safety;Sit to/from stand           Toilet Transfer: Supervision/safety;Ambulation;BSC;RW   Toileting- Clothing Manipulation and Hygiene: Supervision/safety;Sit to/from stand       Functional mobility during ADLs: Supervision/safety;Rolling walker General ADL Comments: Patient moves quickly and often lifts up walker when walking.     Vision       Perception     Praxis      Cognition Arousal/Alertness: Awake/alert Behavior During Therapy: WFL for tasks assessed/performed Overall Cognitive Status: No family/caregiver present to determine baseline cognitive functioning                                 General Comments: Impulsive         Exercises Exercises: Other exercises Other Exercises Other Exercises: flutter x 10 Other Exercises: incentive spirometer x 10 (max 250)   Shoulder Instructions       General Comments      Pertinent Vitals/ Pain       Pain Assessment: No/denies pain  Home Living  Prior Functioning/Environment              Frequency  Min 3X/week        Progress Toward Goals  OT Goals(current goals can now be found in the care plan section)  Progress towards OT goals: Progressing toward goals  Acute Rehab OT Goals Patient Stated Goal: To go home OT Goal Formulation: With patient Time For Goal Achievement: 07/22/19 Potential to Achieve Goals: Good  Plan Discharge plan remains appropriate    Co-evaluation                 AM-PAC OT "6 Clicks" Daily Activity     Outcome Measure   Help from another person  eating meals?: None Help from another person taking care of personal grooming?: A Little Help from another person toileting, which includes using toliet, bedpan, or urinal?: A Little Help from another person bathing (including washing, rinsing, drying)?: A Little Help from another person to put on and taking off regular upper body clothing?: A Little Help from another person to put on and taking off regular lower body clothing?: A Little 6 Click Score: 19    End of Session Equipment Utilized During Treatment: Oxygen;Rolling walker  OT Visit Diagnosis: Muscle weakness (generalized) (M62.81);Unsteadiness on feet (R26.81)   Activity Tolerance Patient tolerated treatment well   Patient Left in chair;with call bell/phone within reach;with chair alarm set   Nurse Communication Mobility status        Time: [id number] OT Time Calculation (min): 30 min  Charges: OT General Charges $OT Visit: 1 Visit OT Treatments $Self Care/Home Management : 23-37 mins  August Luz, OTR/L    Phylliss Bob 07/12/2019, 2:01 PM

## 2019-07-12 NOTE — Care Management Important Message (Signed)
Important Message  Patient Details  Name: Abigail Castillo MRN: [id number] Date of Birth: [birth year]   Medicare Important Message Given:  Yes - Important Message mailed due to current National Emergency   Verbal consent obtained due to current National Emergency  Relationship to patient: Self Contact Name: Kaesha Risser Call Date: 07/12/19  Time: 1529 Phone: [phone] Outcome: Spoke with contact Important Message mailed to: Patient address on file      Tommy Medal 07/12/2019, 3:30 PM

## 2019-07-12 NOTE — Progress Notes (Signed)
Physical Therapy Treatment Patient Details Name: Abigail Castillo MRN: [id number] DOB: 14-Nov-1950 Today's Date: 07/12/2019    History of Present Illness 69 year old female with history of COPD and schizoaffective disorder was exposed to COVID-19 infection about 2/1 and started getting more short of breath last few days.  Also has had 2-3 episodes diarrhea last few days. She was admitted for COPD exacerbation and Covid pneumonia.  Also noted to be hypoxic initially requiring 2 to 3 L nasal cannula to keep oxygen saturations greater than 90%.  Patient admitted to the hospitalist service and started on IV steroids and Remdisivir and transferred to [facility].    PT Comments    Patient on 2L suppl oxygen upon PT entrance. Patient does desat to 84% on room air with ambulation. She does recover quickly (approx 45 seconds) with reapplication of [place]. HR improved compared to yesterday but still high 90s to 120s bpm during session. Patient back on 2L suppl oxygen at end of session. Patient already has oxygen at home that she used prn due to her COPD diagnosis. Patient to discharge home with intermittent supervision/assist from her family. Currently her son is away but her daughter lives nearby. Recommend continued skilled PT services and home PT.     07/12/19 0835  Vitals  Pulse Rate (!) 106  Pulse Rate Source Monitor  Oxygen Therapy  SpO2 94 %  O2 Device Nasal Cannula  O2 Flow Rate (L/min) 2 L/min  Patient Activity (if Appropriate) Other (Comment) (sitting EOB)  Pulse Oximetry Type Continuous     07/12/19 0849  Vitals  Pulse Rate (!) 122 (with ambulation)  Oxygen Therapy  SpO2 90 %  O2 Device Nasal Cannula  O2 Flow Rate (L/min) 2 L/min  Patient Activity (if Appropriate) Ambulating     07/12/19 0854  Vitals  Pulse Rate (!) 127 (post ambulation)  Oxygen Therapy  SpO2 (!) 84 % (2L reapplied during recovery ->90% in approx 45 seconds)  O2 Device Room Air  Patient Activity (if  Appropriate)  (post ambulation during seated recovery)  Pulse Oximetry Type Continuous       Follow Up Recommendations  Home health PT;Supervision - Intermittent     Equipment Recommendations  3in1 (PT)(patient has 4WW to use at home if needed, she also has [facility])       Precautions / Restrictions Precautions Precautions: Fall Restrictions Weight Bearing Restrictions: No    Mobility  Bed Mobility   General bed mobility comments: Patient sitting EOB upon PT arrival. She just finished eating breakfast sitting EOB.  Transfers Overall transfer level: Needs assistance Equipment used: None;4-wheeled walker Transfers: Sit to/from Stand Sit to Stand: Modified independent (Device/Increase time);Supervision;Independent General transfer comment: Cue for brake mgmt with 4WW sit>stand. Sit<>stand without AD modI to independent.  Ambulation/Gait Ambulation/Gait assistance: Supervision;Modified independent (Device/Increase time)   Assistive device: None;4-wheeled walker Gait Pattern/deviations: Decreased step length - right;Decreased step length - left;Step-through pattern(1 right stagger patient self corrected w/ stepping response) Gait velocity: decreased when ambulating without AD   General Gait Details: First two gait trials without AD and patient managing oxygen tubing in room 14ft x 2 with supervision for safety with oxygen tube mgmt progressing toward modI. Patient has suppl oxygen at home already and was using it prn prior to this admission for her COPD diagnosis. Patient reports she is aware of how to manage oxygen tubing for mobility around the home. 3rd gait trial in hallway on room air modI, no LOB. Oxygen saturation decreased to  84% upon return to room with seated recovery. 2L [place] reapplied and patient recovered to >/=90% on 2L with seated rest at EOB. 4th gait trial short distance in room with 4WW with cues for oxygen tube mgmt. Patient does have a 4WW to use at home if needed. Her  mobility/balance improve the more she is up moving around.    Stairs   Stairs assistance: Min guard;Min assist Stair Management: One rail Left(then R on 2nd step)   General stair comments: Patient declined further stair negotiation. Education provided on taking rest breaks as needed when negotiating up stairs to enter her home and having someone asist her into the home.      Balance Overall balance assessment: Modified Independent   Sitting balance-Leahy Scale: (Good+) Sitting balance - Comments: able to reach outside BOS   Standing balance support: No upper extremity supported Standing balance-Leahy Scale: Good Standing balance comment: Able to minimally reach outside BOS. Self corrects near balance losses.      Cognition Arousal/Alertness: Awake/alert      Exercises Other Exercises Other Exercises: flutter x 10(continued cues for improved technique) Other Exercises: incentive spirometer x 10 (max 250)(cues for improved technique)    General Comments        Pertinent Vitals/Pain      Home Living Patient's son is away so she will be home alone. Recommendation for patient to have cellphone on her at all times and supervision from her family as much as possible.    Frequency    Min 3X/week      PT Plan Discharge plan needs to be updated       AM-PAC PT "6 Clicks" Mobility   Outcome Measure  Help needed turning from your back to your side while in a flat bed without using bedrails?: None Help needed moving from lying on your back to sitting on the side of a flat bed without using bedrails?: None Help needed moving to and from a bed to a chair (including a wheelchair)?: None Help needed standing up from a chair using your arms (e.g., wheelchair or bedside chair)?: None Help needed to walk in hospital room?: None Help needed climbing 3-5 steps with a railing? : A Little 6 Click Score: 23    End of Session Equipment Utilized During Treatment:  Oxygen Activity Tolerance: Patient tolerated treatment well Patient left: in chair;with chair alarm set Nurse Communication: Mobility status(oxygen response, via secure chat) PT Visit Diagnosis: Unsteadiness on feet (R26.81);Other abnormalities of gait and mobility (R26.89);Muscle weakness (generalized) (M62.81)     833-909 AM 36 minutes gait training                    Birdie Hopes, DPT, PT Acute Rehab [phone]     Birdie Hopes 07/12/2019, 12:45 PM

## 2019-07-12 NOTE — Progress Notes (Signed)
Patient seens examinmed and agree with poc She is requiring minmal oxygen, ambulatory with minmal issue sre: desats and doesn't require contuous electricity at home for low flow oxygen She is stable for d/c home   Verneita Griffes, MD [facility] 10:53 AM

## 2019-07-12 NOTE — Telephone Encounter (Signed)
I have made two attempts and have been unable to reach patient. I have mailed the patient a letter requesting they call the office to schedule a hospital follow up appointment.   

## 2019-07-12 NOTE — Progress Notes (Signed)
Physical Therapy Treatment Patient Details Name: Abigail Castillo MRN: [id number] DOB: 03-05-1951 Today's Date: 07/12/2019    History of Present Illness 69 year old female with history of COPD and schizoaffective disorder was exposed to COVID-19 infection about 2/1 and started getting more short of breath last few days.  Also has had 2-3 episodes diarrhea last few days. She was admitted for COPD exacerbation and Covid pneumonia.  Also noted to be hypoxic initially requiring 2 to 3 L nasal cannula to keep oxygen saturations greater than 90%.  Patient admitted to the hospitalist service and started on IV steroids and Remdisivir and transferred to [facility].    PT Comments    Patient unsure of discharge date. She is awaiting power to be restored at her home. Patient on 2L oxygen upon PT arrival this morning. HR at rest appears mildly  improved since yesterday. Focus of session on oxygen tube management for safety in the home. Patient did desat to 84% post ambulation approx 4ft on room air. Recovered with approx 45 seconds with seated rest and 2L suppl oxygen reapplied. Patient expresses that she still feels weak. Discussed limitations due to COVID but that supervision would be recommended upon discharge home. Education on using her 4WW if needed, not furniture walking, and keeping cell phone on her at all times in case of emergency. Recommend continued skilled PT services and home PT.    07/12/19 0835  Vitals  Pulse Rate (!) 106  Pulse Rate Source Monitor  Oxygen Therapy  SpO2 94 %  O2 Device Nasal Cannula  O2 Flow Rate (L/min) 2 L/min  Patient Activity (if Appropriate) Other (Comment) (sitting EOB)  Pulse Oximetry Type Continuous     07/12/19 0849  Vitals  Pulse Rate (!) 122 (with ambulation)  Oxygen Therapy  SpO2 90 %  O2 Device Nasal Cannula  O2 Flow Rate (L/min) 2 L/min  Patient Activity (if Appropriate) Ambulating       07/12/19 0854  Vitals  Pulse Rate (!) 127 (post  ambulation)  Oxygen Therapy  SpO2 (!) 84 % (2L reapplied during recovery ->90% in approx 45 seconds)  O2 Device Room Air  Patient Activity (if Appropriate)  (post ambulation during seated recovery)  Pulse Oximetry Type Continuous     Follow Up Recommendations  Home health PT;Supervision - Intermittent     Equipment Recommendations  (patient has 4WW to use at home if needed, she also has [facility])    Recommendations for Other Services       Precautions / Restrictions Precautions Precautions: Fall Restrictions Weight Bearing Restrictions: No    Mobility  Bed Mobility  General bed mobility comments: Patient sitting EOB upon PT arrival. She just finished eating breakfast sitting EOB.  Transfers Overall transfer level: Needs assistance Equipment used: None;4-wheeled walker Transfers: Sit to/from Stand Sit to Stand: Modified independent (Device/Increase time);Supervision         General transfer comment: cue for brake mgmt with 4WW sit>stand  Ambulation/Gait Ambulation/Gait assistance: Supervision;Modified independent (Device/Increase time) Gait Distance (Feet): 40 Feet(x 2 (with pt managing O2 tubing), 60, 20) Assistive device: None;4-wheeled walker Gait Pattern/deviations: Decreased step length - right;Decreased step length - left;Step-through pattern(1 right stagger patient self corrected w/ stepping response) Gait velocity: decreased   General Gait Details: First two gait trials without AD and patient managing oxygen tubing in room 42ft x 2 with supervision for safety with oxygen tube mgmt progressing toward modI. Patient has suppl oxygen at home already and was using it prn  prior to this admission. 3rd gait trial in hallway on room air modI, no LOB. Oxygen saturation decreased to 84% upon return to room with seated recovery. 2L [place] reapplied and patient recovered to >/=90% on 2L with seated rest at EOB. 4th gait trial short distance in room with 4WW with cues for oxygen tube  mgmt. Patient does have a 4WW to use at home if needed. Her mobility/balance improve the moe she is up moving around.    Stairs Stairs: Yes Stairs assistance: Min guard;Min assist Stair Management: One rail Left(then R on 2nd step) Number of Stairs: 2 General stair comments: Patient declined further stair negotiation. Education provided on taking rest breaks as needed when negotiating up stairs to enter her home.     Balance     Sitting balance-Leahy Scale: (Good+) Sitting balance - Comments: able to reach outside BOS   Standing balance support: No upper extremity supported Standing balance-Leahy Scale: Good Standing balance comment: Able to minimally reach outside BOS         Cognition Arousal/Alertness: Awake/alert    Exercises Other Exercises Other Exercises: flutter x 10(continued cues for improved technique) Other Exercises: incentive spirometer x 10 (max 250)(cues for improved technique)    General Comments General comments (skin integrity, edema, etc.): Patient on 2L suppl oxygen via [place] upon PT arrival this morning.      Pertinent Vitals/Pain Pain Assessment: No/denies pain(No complaints of pain. No signs/symptoms of pain.)           PT Goals (current goals can now be found in the care plan section) Progress towards PT goals: Progressing toward goals    Frequency    Min 3X/week      PT Plan Discharge plan needs to be updated       AM-PAC PT "6 Clicks" Mobility   Outcome Measure  Help needed turning from your back to your side while in a flat bed without using bedrails?: None Help needed moving from lying on your back to sitting on the side of a flat bed without using bedrails?: None Help needed moving to and from a bed to a chair (including a wheelchair)?: None Help needed standing up from a chair using your arms (e.g., wheelchair or bedside chair)?: None Help needed to walk in hospital room?: None Help needed climbing 3-5 steps with a railing? :  A Little 6 Click Score: 23    End of Session Equipment Utilized During Treatment: Oxygen Activity Tolerance: Patient tolerated treatment well Patient left: in chair;with chair alarm set Nurse Communication: Mobility status(oxygen response, via secure chat) PT Visit Diagnosis: Unsteadiness on feet (R26.81);Other abnormalities of gait and mobility (R26.89);Muscle weakness (generalized) (M62.81)     Time: [id number] PT Time Calculation (min) (ACUTE ONLY): 36 min  Charges:  $Gait Training: 23-37 mins                     Birdie Hopes, DPT, PT Acute Rehab [phone]     Birdie Hopes 07/12/2019, 9:23 AM

## 2019-07-12 NOTE — Progress Notes (Signed)
Discharge instructions provided to pt and family. Pt discharged with personal home O2 and Additional tank for home use. Home meds returned to pt and pt stable at time of discharge.

## 2019-07-13 ENCOUNTER — Telehealth: Payer: Self-pay | Admitting: Family

## 2019-07-13 NOTE — Telephone Encounter (Signed)
protonix is hospital formulary drug so it was likely changed only while in hospital. [place] rx sent.

## 2019-07-13 NOTE — Telephone Encounter (Signed)
Pt Declined her Phy Therapy for today. And to come back tomorrow  Abigail Castillo ( PT) stated that he told her that it's a calling for Bad weather tomorrow.  Pt said come back Friday. He wanted to let us know that her PT will be delayed

## 2019-07-14 NOTE — Telephone Encounter (Signed)
Just an FYI

## 2019-07-15 ENCOUNTER — Other Ambulatory Visit: Payer: Self-pay

## 2019-07-15 ENCOUNTER — Ambulatory Visit (INDEPENDENT_AMBULATORY_CARE_PROVIDER_SITE_OTHER): Payer: Medicare Other | Admitting: Family

## 2019-07-15 ENCOUNTER — Telehealth: Payer: Self-pay | Admitting: *Deleted

## 2019-07-15 DIAGNOSIS — J1282 Pneumonia due to coronavirus disease 2019: Secondary | ICD-10-CM | POA: Diagnosis not present

## 2019-07-15 DIAGNOSIS — R197 Diarrhea, unspecified: Secondary | ICD-10-CM | POA: Diagnosis not present

## 2019-07-15 DIAGNOSIS — U071 COVID-19: Secondary | ICD-10-CM | POA: Diagnosis not present

## 2019-07-15 NOTE — Telephone Encounter (Signed)
Who Is Calling Patient / Member / Family / Caregiver Call Type Triage / Clinical Caller Name Abigail Castillo Relationship To Patient Daughter Return Phone Number [phone] (Primary) Chief Complaint Paging or Request for Consult Reason for Call Request to Speak to a Physician Initial Comment Caller states her mother was released from the hospital on Tuesday and she was supposed to get a referral to remote to [facility]. Her mother was in the hospital from last Tuesday to this past Tuesday with Covid and she states she is still weak and needs assistance. Caller would rather not wait until Monday to start this process and would like to speak to the on call provider. Translation No Nurse Assessment Nurse: Neena Rhymes, RN, Sharyn Lull Date/Time (Eastern Time): 07/14/2019 1:53:19 PM Confirm and document reason for call. If symptomatic, describe symptoms. ---Caller states her mother was released from the hospital on Tuesday and she was supposed to get a referral to remote to [facility]. Her mother was in the hospital from last Tuesday to this past Tuesday with COVID and she states she is still weak and needs assistance. Caller would rather not wait until Monday to start this process and would like to speak to the on call provider. Called pt and she gave me permissoin to speak with her daughter Abigail Castillo.  Outcome: Pricilla Holm- MD 07/14/2019 2:05:43 PM Spoke with On Call - General Message Result Called Dr Sharlet Salina states the hospital ot her PCP would have to order. Contact # for [facility] is [phone]

## 2019-07-17 ENCOUNTER — Encounter: Payer: Self-pay | Admitting: Family

## 2019-07-17 ENCOUNTER — Telehealth: Payer: Self-pay | Admitting: Family

## 2019-07-17 DIAGNOSIS — R197 Diarrhea, unspecified: Secondary | ICD-10-CM

## 2019-07-17 NOTE — Telephone Encounter (Signed)
Please call pt and see if she is still having diarrhea- if so, I would like her daughter to pick up kit to do stool studies. I don't see that this was done in the hospital.   Order is placed.

## 2019-07-17 NOTE — Telephone Encounter (Signed)
Also, please ask daughter what the name of the service is that she wanted me to look into?  She wanted me to look into some additional services at home such as aid, correct?

## 2019-07-17 NOTE — Progress Notes (Addendum)
Virtual Visit via Video Note  I connected with Abigail Castillo on 07/20/19 at  4:20 PM EST by a video enabled telemedicine application and verified that I am speaking with the correct person using two identifiers.  Location: Patient: home Provider: office   I discussed the limitations of evaluation and management by telemedicine and the availability of in person appointments. The patient expressed understanding and agreed to proceed.  History of Present Illness:  Patient is a 69 yr old female who presents today for hospital follow up following covid-19 infection.  She was admitted 07/05/19 with progressive worsening SOB/diarrhea.  She was noted to have an elevated CRP and a positive COVID test.  She was admitted for COPD exacerbation and COVID pneumonia. She was treated with remdesivir, oxygen, IV antibiotics.   Reports diarrhea on off at the beginning of the month. Not worse.  Since returning home she reports that her breathing has been fair.  Using albuterol prn in addition to stiolto respimat.  Reports that her breathing is fair. Using oxygen 2 liters.      Review of Systems Past Medical History:  Diagnosis Date  . Anxiety   . Blood transfusion   . Blood transfusion without reported diagnosis   . Chronic mental illness   . COPD (chronic obstructive pulmonary disease) ([place])   . DEPRESSION 05/31/2009  . Emphysema of lung ([place])   . Esophageal stricture 03/14/2015  . History of home oxygen therapy   . History of pneumonia   . Osteoporosis   . Shortness of breath dyspnea   . Tobacco abuse    ongoing  . Vitamin B 12 deficiency   . Vitamin D deficiency      Social History   Socioeconomic History  . Marital status: Divorced    Spouse name: Not on file  . Number of children: 3  . Years of education: Not on file  . Highest education level: Not on file  Occupational History  . Occupation: disabled    Fish farm manager: UNEMPLOYED  Tobacco Use  . Smoking status: Former Smoker   Types: Cigarettes    Quit date: 08/12/2011    Years since quitting: 7.9  . Smokeless tobacco: Never Used  Substance and Sexual Activity  . Alcohol use: No    Alcohol/week: 0.0 standard drinks  . Drug use: No  . Sexual activity: Never  Other Topics Concern  . Not on file  Social History Narrative   Lost a son to Lung CA 1/18   Lives with a son in [place]   Has a daughter who lives locally   Social Determinants of Health   Financial Resource Strain:   . Difficulty of Paying Living Expenses: Not on file  Food Insecurity:   . Worried About Charity fundraiser in the Last Year: Not on file  . Ran Out of Food in the Last Year: Not on file  Transportation Needs:   . Lack of Transportation (Medical): Not on file  . Lack of Transportation (Non-Medical): Not on file  Physical Activity:   . Days of Exercise per Week: Not on file  . Minutes of Exercise per Session: Not on file  Stress:   . Feeling of Stress : Not on file  Social Connections:   . Frequency of Communication with Friends and Family: Not on file  . Frequency of Social Gatherings with Friends and Family: Not on file  . Attends Religious Services: Not on file  . Active Member of Clubs  or Organizations: Not on file  . Attends Archivist Meetings: Not on file  . Marital Status: Not on file  Intimate Partner Violence:   . Fear of Current or Ex-Partner: Not on file  . Emotionally Abused: Not on file  . Physically Abused: Not on file  . Sexually Abused: Not on file    Past Surgical History:  Procedure Laterality Date  . ABDOMINAL HYSTERECTOMY    . BREAST EXCISIONAL BIOPSY Left 2017  . BREAST LUMPECTOMY  1972   left breast  . BREAST LUMPECTOMY WITH RADIOACTIVE SEED LOCALIZATION Left 11/22/2015   Procedure: LEFT BREAST LUMPECTOMY WITH RADIOACTIVE SEED LOCALIZATION;  Surgeon: Erroll Luna, MD;  Location: [facility];  Service: General;  Laterality: Left;  . BREAST SURGERY  ?2015   lumpectomy, left  . CERVICAL  CONE BIOPSY    . CHOLECYSTECTOMY    . COLONOSCOPY    . ESOPHAGOGASTRODUODENOSCOPY (EGD) WITH ESOPHAGEAL DILATION    . UPPER GASTROINTESTINAL ENDOSCOPY      Family History  Problem Relation Age of Onset  . Coronary artery disease Mother   . Diabetes Mother   . Hypertension Mother   . Coronary artery disease Father   . Diabetes Father        Grandfather  . Hypertension Father   . Stroke Father   . Other Father        colostomy bag  . Breast cancer Sister   . Cancer Other        niece--breast  . Diabetes Sister   . Hypertension Sister   . Mental illness Sister        x 4  . Esophageal cancer Neg Hx   . Stomach cancer Neg Hx   . Rectal cancer Neg Hx   . Colon cancer Neg Hx   . Colon polyps Neg Hx     Allergies  Allergen Reactions  . Sulfonamide Derivatives Hives    Current Outpatient Medications on File Prior to Visit  Medication Sig Dispense Refill  . cholecalciferol (VITAMIN D) 1000 UNITS tablet Take 3,000 Units by mouth daily.     Marland Kitchen DEXILANT 60 MG capsule TAKE ONE CAPSULE BY MOUTH DAILY 30 capsule 5  . LORazepam (ATIVAN) 1 MG tablet Take 1 mg by mouth 2 (two) times daily as needed for anxiety.     Marland Kitchen OLANZapine (ZYPREXA) 5 MG tablet Take 7.5 mg by mouth daily.     . OXYGEN Inhale 2 L into the lungs.     . predniSONE (DELTASONE) 10 MG tablet Take 5 tablets (50 mg total) by mouth daily for 1 day, THEN 4 tablets (40 mg total) daily for 1 day, THEN 3 tablets (30 mg total) daily for 1 day, THEN 2 tablets (20 mg total) daily for 1 day, THEN 1 tablet (10 mg total) daily for 1 day. 15 tablet 0  . Spacer/Aero-Holding Chambers (AEROCHAMBER MV) inhaler Use as instructed 1 each 0  . STIOLTO RESPIMAT 2.5-2.5 MCG/ACT AERS INHALE TWO PUFFS INTO THE LUNGS DAILY (Patient taking differently: Inhale 2 puffs into the lungs daily. ) 1 Inhaler 0  . VENTOLIN HFA 108 (90 Base) MCG/ACT inhaler INHALE TWO PUFFS BY MOUTH INTO THE LUNGS EVERY 6 HOURS AS NEEDED FOR WHEEZING OR SHORTNESS OF BREATH  (Patient taking differently: Inhale 2 puffs into the lungs every 6 (six) hours as needed for shortness of breath. ) 6.7 g 5  . vitamin B-12 (CYANOCOBALAMIN) 1000 MCG tablet TAKE ONE TABLET BY MOUTH DAILY 30 tablet 5  No current facility-administered medications on file prior to visit.    There were no vitals taken for this visit.    Gen: Awake, alert, no acute distress Resp: Breathing appears even and non-labored Psych: calm/pleasant demeanor Neuro: Alert and Oriented x 3, + facial symmetry, speech is clear.  A/P  COVID-19 pneumonia- clinically improved. Advised pt to continue using oxygen 2L and schedule follow up with pulmonology. Complete prednisone taper.   Diarrhea- if no improvement in next few days will plan to obtain stool studies.    Home health has been arranged.    20 minutes spent on today's visit.    I discussed the assessment and treatment plan with the patient. The patient was provided an opportunity to ask questions and all were answered. The patient agreed with the plan and demonstrated an understanding of the instructions.   The patient was advised to call back or seek an in-person evaluation if the symptoms worsen or if the condition fails to improve as anticipated.  Nance Pear, NP

## 2019-07-18 ENCOUNTER — Inpatient Hospital Stay: Payer: Medicare Other | Admitting: Family

## 2019-07-18 NOTE — Telephone Encounter (Signed)
Patient was seen 07-15-19, per note, home health was arranged.

## 2019-07-18 NOTE — Telephone Encounter (Signed)
Per daughter she is not sure if patient still has diarrhea, "will call me back if she is" . The services she was referring to is called [facility] ( covid care at home department).  Spoke to [facility] rep Caryl Pina at [phone] and she will fax me a referral form. Their fax number is [phone].

## 2019-07-20 DIAGNOSIS — F259 Schizoaffective disorder, unspecified: Secondary | ICD-10-CM | POA: Diagnosis not present

## 2019-07-20 DIAGNOSIS — U071 COVID-19: Secondary | ICD-10-CM | POA: Diagnosis not present

## 2019-07-20 DIAGNOSIS — Z9981 Dependence on supplemental oxygen: Secondary | ICD-10-CM | POA: Diagnosis not present

## 2019-07-20 DIAGNOSIS — F419 Anxiety disorder, unspecified: Secondary | ICD-10-CM | POA: Diagnosis not present

## 2019-07-20 DIAGNOSIS — K222 Esophageal obstruction: Secondary | ICD-10-CM | POA: Diagnosis not present

## 2019-07-20 DIAGNOSIS — M810 Age-related osteoporosis without current pathological fracture: Secondary | ICD-10-CM | POA: Diagnosis not present

## 2019-07-20 DIAGNOSIS — F329 Major depressive disorder, single episode, unspecified: Secondary | ICD-10-CM | POA: Diagnosis not present

## 2019-07-20 DIAGNOSIS — J9621 Acute and chronic respiratory failure with hypoxia: Secondary | ICD-10-CM | POA: Diagnosis not present

## 2019-07-20 DIAGNOSIS — J441 Chronic obstructive pulmonary disease with (acute) exacerbation: Secondary | ICD-10-CM | POA: Diagnosis not present

## 2019-07-20 DIAGNOSIS — R000 Tachycardia, unspecified: Secondary | ICD-10-CM | POA: Diagnosis not present

## 2019-07-20 NOTE — Telephone Encounter (Signed)
Referral faxed to [facility],Per daughter patient still has diarrhea. Order sign for stool test, she will try to come in today to pick up kit.

## 2019-08-03 DIAGNOSIS — J441 Chronic obstructive pulmonary disease with (acute) exacerbation: Secondary | ICD-10-CM | POA: Diagnosis not present

## 2019-08-03 DIAGNOSIS — R000 Tachycardia, unspecified: Secondary | ICD-10-CM | POA: Diagnosis not present

## 2019-08-03 DIAGNOSIS — J9621 Acute and chronic respiratory failure with hypoxia: Secondary | ICD-10-CM | POA: Diagnosis not present

## 2019-08-03 DIAGNOSIS — F259 Schizoaffective disorder, unspecified: Secondary | ICD-10-CM | POA: Diagnosis not present

## 2019-08-03 DIAGNOSIS — F419 Anxiety disorder, unspecified: Secondary | ICD-10-CM | POA: Diagnosis not present

## 2019-08-03 DIAGNOSIS — U071 COVID-19: Secondary | ICD-10-CM | POA: Diagnosis not present

## 2019-08-05 DIAGNOSIS — U071 COVID-19: Secondary | ICD-10-CM | POA: Diagnosis not present

## 2019-08-05 DIAGNOSIS — F419 Anxiety disorder, unspecified: Secondary | ICD-10-CM | POA: Diagnosis not present

## 2019-08-05 DIAGNOSIS — J441 Chronic obstructive pulmonary disease with (acute) exacerbation: Secondary | ICD-10-CM | POA: Diagnosis not present

## 2019-08-05 DIAGNOSIS — J9621 Acute and chronic respiratory failure with hypoxia: Secondary | ICD-10-CM | POA: Diagnosis not present

## 2019-08-05 DIAGNOSIS — R000 Tachycardia, unspecified: Secondary | ICD-10-CM | POA: Diagnosis not present

## 2019-08-05 DIAGNOSIS — F259 Schizoaffective disorder, unspecified: Secondary | ICD-10-CM | POA: Diagnosis not present

## 2019-08-09 ENCOUNTER — Other Ambulatory Visit: Payer: Self-pay

## 2019-08-17 DIAGNOSIS — R000 Tachycardia, unspecified: Secondary | ICD-10-CM | POA: Diagnosis not present

## 2019-08-17 DIAGNOSIS — U071 COVID-19: Secondary | ICD-10-CM | POA: Diagnosis not present

## 2019-08-17 DIAGNOSIS — F259 Schizoaffective disorder, unspecified: Secondary | ICD-10-CM | POA: Diagnosis not present

## 2019-08-17 DIAGNOSIS — J441 Chronic obstructive pulmonary disease with (acute) exacerbation: Secondary | ICD-10-CM | POA: Diagnosis not present

## 2019-08-17 DIAGNOSIS — J9621 Acute and chronic respiratory failure with hypoxia: Secondary | ICD-10-CM | POA: Diagnosis not present

## 2019-08-17 DIAGNOSIS — F419 Anxiety disorder, unspecified: Secondary | ICD-10-CM | POA: Diagnosis not present

## 2019-08-19 DIAGNOSIS — U071 COVID-19: Secondary | ICD-10-CM | POA: Diagnosis not present

## 2019-09-01 DIAGNOSIS — F251 Schizoaffective disorder, depressive type: Secondary | ICD-10-CM | POA: Diagnosis not present

## 2019-09-01 DIAGNOSIS — Z79899 Other long term (current) drug therapy: Secondary | ICD-10-CM | POA: Diagnosis not present

## 2019-09-05 ENCOUNTER — Other Ambulatory Visit: Payer: Self-pay | Admitting: Family

## 2019-11-29 ENCOUNTER — Other Ambulatory Visit: Payer: Self-pay | Admitting: Family

## 2019-11-29 DIAGNOSIS — Z1231 Encounter for screening mammogram for malignant neoplasm of breast: Secondary | ICD-10-CM

## 2019-12-15 ENCOUNTER — Other Ambulatory Visit: Payer: Self-pay | Admitting: Family

## 2019-12-16 ENCOUNTER — Other Ambulatory Visit: Payer: Self-pay

## 2019-12-16 MED ORDER — ALBUTEROL SULFATE HFA 108 (90 BASE) MCG/ACT IN AERS: INHALATION_SPRAY | RESPIRATORY_TRACT | 5 refills | Status: DC

## 2019-12-16 MED ORDER — DEXILANT 60 MG PO CPDR: 1.0000 | DELAYED_RELEASE_CAPSULE | Freq: Every day | ORAL | 5 refills | Status: DC

## 2020-01-06 ENCOUNTER — Ambulatory Visit
Admission: RE | Admit: 2020-01-06 | Discharge: 2020-01-06 | Disposition: A | Discharge status: Home / Self Care | Payer: Medicare Other | Source: Ambulatory Visit | Attending: Family | Admitting: Family

## 2020-01-06 ENCOUNTER — Other Ambulatory Visit: Payer: Self-pay

## 2020-01-06 DIAGNOSIS — Z1231 Encounter for screening mammogram for malignant neoplasm of breast: Secondary | ICD-10-CM | POA: Diagnosis not present

## 2020-01-27 NOTE — Progress Notes (Signed)
I connected with [name] today by telephone and verified that I am speaking with the correct person using two identifiers. Location patient: home Location provider: work Persons participating in the virtual visit: patient, Marine scientist.    I discussed the limitations, risks, security and privacy concerns of performing an evaluation and management service by telephone and the availability of in person appointments. I also discussed with the patient that there may be a patient responsible charge related to this service. The patient expressed understanding and verbally consented to this telephonic visit.    Interactive audio and video telecommunications were attempted between this provider and patient, however failed, due to patient having technical difficulties OR patient did not have access to video capability.  We continued and completed visit with audio only.  Some vital signs may be absent or patient reported.    Subjective:   Abigail Castillo is a 69 y.o. female who presents for Medicare Annual (Subsequent) preventive examination.  Review of Systems    Cardiac Risk Factors include: advanced age (>60men, >64 women)     Objective:      Advanced Directives 01/31/2020 07/05/2019 01/25/2019 01/22/2018 12/11/2016 11/22/2015 11/13/2015  Does Patient Have a Medical Advance Directive? No No No No No No No  Would patient like information on creating a medical advance directive? No - Patient declined No - Patient declined No - Patient declined Yes (MAU/Ambulatory/Procedural Areas - Information given) Yes (MAU/Ambulatory/Procedural Areas - Information given) No - patient declined information No - patient declined information    Current Medications (verified) Outpatient Encounter Medications as of 01/31/2020  Medication Sig  . albuterol (VENTOLIN HFA) 108 (90 Base) MCG/ACT inhaler INHALE TWO PUFFS BY MOUTH INTO THE LUNGS EVERY 6 HOURS AS NEEDED FOR WHEEZING OR SHORTNESS OF BREATH  . cholecalciferol (VITAMIN D) 1000  UNITS tablet Take 3,000 Units by mouth daily.   Marland Kitchen dexlansoprazole (DEXILANT) 60 MG capsule Take 1 capsule (60 mg total) by mouth daily.  Marland Kitchen LORazepam (ATIVAN) 1 MG tablet Take 1 mg by mouth 2 (two) times daily as needed for anxiety.   . OXYGEN Inhale 2 L into the lungs.   Marland Kitchen Spacer/Aero-Holding Chambers (AEROCHAMBER MV) inhaler Use as instructed  . STIOLTO RESPIMAT 2.5-2.5 MCG/ACT AERS INHALE TWO PUFFS INTO THE LUNGS DAILY (Patient taking differently: Inhale 2 puffs into the lungs daily. )  . vitamin B-12 (CYANOCOBALAMIN) 1000 MCG tablet TAKE ONE TABLET BY MOUTH DAILY  . OLANZapine (ZYPREXA) 5 MG tablet Take 7.5 mg by mouth daily.    No facility-administered encounter medications on file as of 01/31/2020.    Allergies (verified) Sulfonamide derivatives   History: Past Medical History:  Diagnosis Date  . Anxiety   . Blood transfusion   . Blood transfusion without reported diagnosis   . Chronic mental illness   . COPD (chronic obstructive pulmonary disease) ([place])   . DEPRESSION 05/31/2009  . Emphysema of lung ([place])   . Esophageal stricture 03/14/2015  . History of home oxygen therapy   . History of pneumonia   . Osteoporosis   . Shortness of breath dyspnea   . Tobacco abuse    ongoing  . Vitamin B 12 deficiency   . Vitamin D deficiency    Past Surgical History:  Procedure Laterality Date  . ABDOMINAL HYSTERECTOMY    . BREAST EXCISIONAL BIOPSY Left 2017  . BREAST LUMPECTOMY  1972   left breast  . BREAST LUMPECTOMY WITH RADIOACTIVE SEED LOCALIZATION Left 11/22/2015   Procedure: LEFT BREAST LUMPECTOMY WITH RADIOACTIVE  SEED LOCALIZATION;  Surgeon: Erroll Luna, MD;  Location: [facility];  Service: General;  Laterality: Left;  . BREAST SURGERY  ?2015   lumpectomy, left  . CERVICAL CONE BIOPSY    . CHOLECYSTECTOMY    . COLONOSCOPY    . ESOPHAGOGASTRODUODENOSCOPY (EGD) WITH ESOPHAGEAL DILATION    . UPPER GASTROINTESTINAL ENDOSCOPY     Family History  Problem Relation Age of Onset  .  Coronary artery disease Mother   . Diabetes Mother   . Hypertension Mother   . Coronary artery disease Father   . Diabetes Father        Grandfather  . Hypertension Father   . Stroke Father   . Other Father        colostomy bag  . Breast cancer Sister   . Cancer Other        niece--breast  . Diabetes Sister   . Hypertension Sister   . Mental illness Sister        x 4  . Esophageal cancer Neg Hx   . Stomach cancer Neg Hx   . Rectal cancer Neg Hx   . Colon cancer Neg Hx   . Colon polyps Neg Hx    Social History   Socioeconomic History  . Marital status: Divorced    Spouse name: Not on file  . Number of children: 3  . Years of education: Not on file  . Highest education level: Not on file  Occupational History  . Occupation: disabled    Fish farm manager: UNEMPLOYED  Tobacco Use  . Smoking status: Former Smoker    Types: Cigarettes    Quit date: 08/12/2011    Years since quitting: 8.4  . Smokeless tobacco: Never Used  Substance and Sexual Activity  . Alcohol use: No    Alcohol/week: 0.0 standard drinks  . Drug use: No  . Sexual activity: Never  Other Topics Concern  . Not on file  Social History Narrative   Lost a son to Lung CA 1/18   Lives with a son in [place]   Has a daughter who lives locally   Social Determinants of Health   Financial Resource Strain: Low Risk   . Difficulty of Paying Living Expenses: Not hard at all  Food Insecurity: No Food Insecurity  . Worried About Charity fundraiser in the Last Year: Never true  . Ran Out of Food in the Last Year: Never true  Transportation Needs: No Transportation Needs  . Lack of Transportation (Medical): No  . Lack of Transportation (Non-Medical): No  Physical Activity:   . Days of Exercise per Week: Not on file  . Minutes of Exercise per Session: Not on file  Stress:   . Feeling of Stress : Not on file  Social Connections:   . Frequency of Communication with Friends and Family: Not on file  . Frequency of  Social Gatherings with Friends and Family: Not on file  . Attends Religious Services: Not on file  . Active Member of Clubs or Organizations: Not on file  . Attends Archivist Meetings: Not on file  . Marital Status: Not on file    Tobacco Counseling Counseling given: Not Answered   Clinical Intake: Pain : No/denies pain    Activities of Daily Living In your present state of health, do you have any difficulty performing the following activities: 01/31/2020 07/06/2019  Hearing? N N  Vision? N Y  Difficulty concentrating or making decisions? N N  Walking or  climbing stairs? N N  Dressing or bathing? N N  Doing errands, shopping? Y N  Preparing Food and eating ? N -  Using the Toilet? N -  In the past six months, have you accidently leaked urine? Y -  Do you have problems with loss of bowel control? N -  Managing your Medications? Y -  Managing your Finances? Y -  Housekeeping or managing your Housekeeping? N -  Some recent data might be hidden    Patient Care Team: [name], NP as PCP - General (Internal Medicine) Gatha Mayer, MD as Consulting Physician (Gastroenterology) Lendon Colonel, MD as Referring Physician (Psychiatry)  Indicate any recent Medical Services you may have received from other than [facility] providers in the past year (date may be approximate).     Assessment:   This is a routine wellness examination for [place].  Dietary issues and exercise activities discussed: Current Exercise Habits: The patient does not participate in regular exercise at present, Exercise limited by: None identified Diet (meal preparation, eat out, water intake, caffeinated beverages, dairy products, fruits and vegetables): in general, a "healthy" diet  , well balanced   Goals    . Increase physical activity     As tolerated.      Depression Screen PHQ 2/9 Scores 01/31/2020 01/25/2019 01/22/2018 11/11/2017 12/11/2016 07/13/2015 07/13/2015  PHQ - 2 Score 0 0 0 2 0 0 0    PHQ- 9 Score - - - 8 - - -    Fall Risk Fall Risk  01/31/2020 01/25/2019 01/22/2018 12/11/2016 07/13/2015  Falls in the past year? 0 0 No No No  Number falls in past yr: 0 - - - -  Injury with Fall? 0 - - - -  Follow up Education provided;Falls prevention discussed - - - -    Any stairs in or around the home? Yes  If so, are there any without handrails? No  Home free of loose throw rugs in walkways, pet beds, electrical cords, etc? Yes  Adequate lighting in your home to reduce risk of falls? Yes   ASSISTIVE DEVICES UTILIZED TO PREVENT FALLS:  Life alert? No  Use of a cane, walker or w/c? Yes  Grab bars in the bathroom? Yes  Shower chair or bench in shower? Yes  Elevated toilet seat or a handicapped toilet? No    Cognitive Function:   MMSE - Mini Mental State Exam 01/31/2020 01/22/2018 12/11/2016  Not completed: Unable to complete - -  Orientation to time - 4 5  Orientation to Place - 5 5  Registration - 3 3  Attention/ Calculation - 5 5  Recall - 2 2  Language- name 2 objects - 2 2  Language- repeat - 1 1  Language- follow 3 step command - 3 3  Language- read & follow direction - 1 1  Write a sentence - 1 1  Copy design - 1 1  Total score - 28 29     6CIT Screen 01/25/2019  What Year? 0 points  What month? 0 points  What time? 0 points  Count back from 20 0 points  Months in reverse 4 points  Repeat phrase 4 points  Total Score 8    Immunizations Immunization History  Administered Date(s) Administered  . Influenza Whole 03/02/2008, 04/09/2010  . Influenza, High Dose Seasonal PF 03/13/2017  . Influenza, Seasonal, Injecte, Preservative Fre 06/18/2012  . Influenza,inj,Quad PF,6+ Mos 02/01/2013, 02/03/2014, 02/05/2015, 03/10/2016  . Influenza,inj,Quad PF,6-35 Mos 01/25/2019  .  Influenza-Unspecified 02/23/2018  . PPD Test 12/17/2010  . Pneumococcal Conjugate-13 11/11/2017  . Pneumococcal Polysaccharide-23 03/02/2008, 02/01/2013  . Tdap 05/26/2008    TDAP status:  Due, Education has been provided regarding the importance of this vaccine. Advised may receive this vaccine at local pharmacy or Health Dept. Aware to provide a copy of the vaccination record if obtained from local pharmacy or Health Dept. Verbalized acceptance and understanding. Flu Vaccine status: Up to date Pneumococcal vaccine status: Up to date Covid-19 vaccine status: Information provided on how to obtain vaccines.    Screening Tests Health Maintenance  Topic Date Due  . Hepatitis C Screening  Never done  . COVID-19 Vaccine (1) Never done  . COLONOSCOPY  02/11/2017  . TETANUS/TDAP  05/26/2018  . PNA vac Low Risk Adult (2 of 2 - PPSV23) 11/12/2018  . INFLUENZA VACCINE  12/25/2019  . MAMMOGRAM  01/05/2021  . DEXA SCAN  Completed    Health Maintenance  Health Maintenance Due  Topic Date Due  . Hepatitis C Screening  Never done  . COVID-19 Vaccine (1) Never done  . COLONOSCOPY  02/11/2017  . TETANUS/TDAP  05/26/2018  . PNA vac Low Risk Adult (2 of 2 - PPSV23) 11/12/2018  . INFLUENZA VACCINE  12/25/2019    CCS- pt declines Mammogram status: Completed 01/06/20. Repeat every year Bone Density status: Completed 2018. Results reflect: Bone density results: OSTEOPOROSIS. Repeat every 2 years. pt declines today.   Additional Screening:   Vision Screening: Recommended annual ophthalmology exams for early detection of glaucoma and other disorders of the eye. al dental exams for proper oral hygiene  Community Resource Referral / Chronic Care Management: CRR required this visit?  No   CCM required this visit?  No      Plan:    Continue to eat heart healthy diet (full of fruits, vegetables, whole grains, lean protein, water--limit salt, fat, and sugar intake) and increase physical activity as tolerated.  Continue doing brain stimulating activities (puzzles, reading, adult coloring books, staying active) to keep memory sharp.    I have personally reviewed and noted the  following in the patient's chart:   . Medical and social history . Use of alcohol, tobacco or illicit drugs  . Current medications and supplements . Functional ability and status . Nutritional status . Physical activity . Advanced directives . List of other physicians . Hospitalizations, surgeries, and ER visits in previous 12 months . Vitals . Screenings to include cognitive, depression, and falls . Referrals and appointments  In addition, I have reviewed and discussed with patient certain preventive protocols, quality metrics, and best practice recommendations. A written personalized care plan for preventive services as well as general preventive health recommendations were provided to patient.   Due to this being a telephonic visit, the after visit summary with patients personalized plan was offered to patient via mail or my-chart. Patient declined at this time   Shela Nevin, RN   01/31/2020

## 2020-01-31 ENCOUNTER — Other Ambulatory Visit: Payer: Self-pay

## 2020-01-31 ENCOUNTER — Encounter: Payer: Self-pay | Admitting: *Deleted

## 2020-01-31 ENCOUNTER — Ambulatory Visit (INDEPENDENT_AMBULATORY_CARE_PROVIDER_SITE_OTHER): Payer: Medicare Other | Admitting: *Deleted

## 2020-01-31 DIAGNOSIS — Z0000 Encounter for general adult medical examination without abnormal findings: Secondary | ICD-10-CM | POA: Diagnosis not present

## 2020-01-31 NOTE — Patient Instructions (Signed)
Continue to eat heart healthy diet (full of fruits, vegetables, whole grains, lean protein, water--limit salt, fat, and sugar intake) and increase physical activity as tolerated.  Continue doing brain stimulating activities (puzzles, reading, adult coloring books, staying active) to keep memory sharp.    Abigail Castillo , Thank you for taking time to come for your Medicare Wellness Visit. I appreciate your ongoing commitment to your health goals. Please review the following plan we discussed and let me know if I can assist you in the future.   These are the goals we discussed: Goals    . Increase physical activity     As tolerated.       This is a list of the screening recommended for you and due dates:  Health Maintenance  Topic Date Due  .  Hepatitis C: One time screening is recommended by Center for Disease Control  (CDC) for  adults born from [birth year] through 1964.   Never done  . COVID-19 Vaccine (1) Never done  . Colon Cancer Screening  02/11/2017  . Tetanus Vaccine  05/26/2018  . Pneumonia vaccines (2 of 2 - PPSV23) 11/12/2018  . Flu Shot  12/25/2019  . Mammogram  01/05/2021  . DEXA scan (bone density measurement)  Completed    Preventive Care 65 Years and Older, Female Preventive care refers to lifestyle choices and visits with your health care provider that can promote health and wellness. This includes:  A yearly physical exam. This is also called an annual well check.  Regular dental and eye exams.  Immunizations.  Screening for certain conditions.  Healthy lifestyle choices, such as diet and exercise. What can I expect for my preventive care visit? Physical exam Your health care provider will check:  Height and weight. These may be used to calculate body mass index (BMI), which is a measurement that tells if you are at a healthy weight.  Heart rate and blood pressure.  Your skin for abnormal spots. Counseling Your health care provider may ask you questions  about:  Alcohol, tobacco, and drug use.  Emotional well-being.  Home and relationship well-being.  Sexual activity.  Eating habits.  History of falls.  Memory and ability to understand (cognition).  Work and work Statistician.  Pregnancy and menstrual history. What immunizations do I need?  Influenza (flu) vaccine  This is recommended every year. Tetanus, diphtheria, and pertussis (Tdap) vaccine  You may need a Td booster every 10 years. Varicella (chickenpox) vaccine  You may need this vaccine if you have not already been vaccinated. Zoster (shingles) vaccine  You may need this after age 51. Pneumococcal conjugate (PCV13) vaccine  One dose is recommended after age 108. Pneumococcal polysaccharide (PPSV23) vaccine  One dose is recommended after age 71. Measles, mumps, and rubella (MMR) vaccine  You may need at least one dose of MMR if you were born in 1957 or later. You may also need a second dose. Meningococcal conjugate (MenACWY) vaccine  You may need this if you have certain conditions. Hepatitis A vaccine  You may need this if you have certain conditions or if you travel or work in places where you may be exposed to hepatitis A. Hepatitis B vaccine  You may need this if you have certain conditions or if you travel or work in places where you may be exposed to hepatitis B. Haemophilus influenzae type b (Hib) vaccine  You may need this if you have certain conditions. You may receive vaccines as individual doses or as  more than one vaccine together in one shot (combination vaccines). Talk with your health care provider about the risks and benefits of combination vaccines. What tests do I need? Blood tests  Lipid and cholesterol levels. These may be checked every 5 years, or more frequently depending on your overall health.  Hepatitis C test.  Hepatitis B test. Screening  Lung cancer screening. You may have this screening every year starting at age 77 if  you have a 30-pack-year history of smoking and currently smoke or have quit within the past 15 years.  Colorectal cancer screening. All adults should have this screening starting at age 71 and continuing until age 70. Your health care provider may recommend screening at age 56 if you are at increased risk. You will have tests every 1-10 years, depending on your results and the type of screening test.  Diabetes screening. This is done by checking your blood sugar (glucose) after you have not eaten for a while (fasting). You may have this done every 1-3 years.  Mammogram. This may be done every 1-2 years. Talk with your health care provider about how often you should have regular mammograms.  BRCA-related cancer screening. This may be done if you have a family history of breast, ovarian, tubal, or peritoneal cancers. Other tests  Sexually transmitted disease (STD) testing.  Bone density scan. This is done to screen for osteoporosis. You may have this done starting at age 52. Follow these instructions at home: Eating and drinking  Eat a diet that includes fresh fruits and vegetables, whole grains, lean protein, and low-fat dairy products. Limit your intake of foods with high amounts of sugar, saturated fats, and salt.  Take vitamin and mineral supplements as recommended by your health care provider.  Do not drink alcohol if your health care provider tells you not to drink.  If you drink alcohol: ? Limit how much you have to 0-1 drink a day. ? Be aware of how much alcohol is in your drink. In the [place] one drink equals one 12 oz bottle of beer (355 mL), one 5 oz glass of wine (148 mL), or one 1 oz glass of hard liquor (44 mL). Lifestyle  Take daily care of your teeth and gums.  Stay active. Exercise for at least 30 minutes on 5 or more days each week.  Do not use any products that contain nicotine or tobacco, such as cigarettes, e-cigarettes, and chewing tobacco. If you need help  quitting, ask your health care provider.  If you are sexually active, practice safe sex. Use a condom or other form of protection in order to prevent STIs (sexually transmitted infections).  Talk with your health care provider about taking a low-dose aspirin or statin. What's next?  Go to your health care provider once a year for a well check visit.  Ask your health care provider how often you should have your eyes and teeth checked.  Stay up to date on all vaccines. This information is not intended to replace advice given to you by your health care provider. Make sure you discuss any questions you have with your health care provider. Document Revised: 05/06/2018 Document Reviewed: 05/06/2018 Elsevier Patient Education  2020 [employer].

## 2020-02-08 ENCOUNTER — Other Ambulatory Visit: Payer: Self-pay | Admitting: Family

## 2020-02-23 DIAGNOSIS — Z79899 Other long term (current) drug therapy: Secondary | ICD-10-CM | POA: Diagnosis not present

## 2020-02-23 DIAGNOSIS — F411 Generalized anxiety disorder: Secondary | ICD-10-CM | POA: Diagnosis not present

## 2020-02-23 DIAGNOSIS — F251 Schizoaffective disorder, depressive type: Secondary | ICD-10-CM | POA: Diagnosis not present

## 2020-03-20 ENCOUNTER — Telehealth: Payer: Self-pay | Admitting: Emergency Medicine

## 2020-03-20 MED ORDER — PREDNISONE 10 MG PO TABS: ORAL_TABLET | ORAL | 0 refills | Status: DC

## 2020-03-20 MED ORDER — STIOLTO RESPIMAT 2.5-2.5 MCG/ACT IN AERS: 2.0000 | INHALATION_SPRAY | Freq: Every day | RESPIRATORY_TRACT | 2 refills | Status: DC

## 2020-03-20 NOTE — Telephone Encounter (Signed)
Recommend flonase nasal spray and mucinex 600mg  twice daily. Please send in prednisone taper 40mg  x 2 days; 30mg  x 2 days; 20mg  x 2 days; 10mg  x 2 days. Refill Stiolto ok. Needs televisit if not better

## 2020-03-20 NOTE — Telephone Encounter (Signed)
Called and spoke with Abigail Castillo per DPR who states that patient is congested and having trouble breathing that started about 2 weeks ago overall. Started with runny nose and congestion. Now states that her nose is not running as much is not sure if it has moved into her chest. Denies cough, when she blows her nose get light lime green color nasal discharge, denies any fevers. Had Covid in February. Has been using ventolin inhaler q6. Also needs a refill on her stiloto inhaler called in to [facility].    Abigail Castillo please advise as Dr. Lamonte Sakai is out of the office till 03/26/20

## 2020-03-20 NOTE — Telephone Encounter (Signed)
Spoke with the pt and notified of recs per [facility]  She verbalized understanding  Rxs were sent

## 2020-03-22 ENCOUNTER — Telehealth: Payer: Self-pay | Admitting: Primary Care

## 2020-03-22 NOTE — Telephone Encounter (Signed)
Medication name and strength: Stiolto  Provider: Lamonte Sakai Pharmacy: [facility] Patient insurance ID:  Phone:  Fax:    Was the PA started on CMM?  yes  Abigail Castillo (Key: [id number]) Rx #: [phone] Stiolto Respimat 2.5-2.5MCG/ACT aerosol

## 2020-04-07 NOTE — Telephone Encounter (Signed)
Error

## 2020-04-10 NOTE — Telephone Encounter (Signed)
Pt's daughter Hal Hope calling for update on PA for Stiolto. Pt has been out for two months.  [phone]

## 2020-04-10 NOTE — Telephone Encounter (Signed)
Checked CMM and saw that the PA for Stiolto was denied:  Why did we deny your request? We denied this request under Medicare Part D because: The requested drug is not on your plan's formulary  (list of covered drugs). Your Medicare Part D drug plan was asked to cover a drug that is not on the formulary  (this is called a formulary exception). Your prescriber did not provide the detailed information that is required in  order to approve the request. To receive a formulary exception, your prescriber must provide information that  documents at least one of the following has occurred:  - You have tried the formulary drugs for the treatment of your condition and they did not work for you. OR - The formulary drugs could cause adverse effects. OR - The formulary drugs would be less effective for your condition than the requested drug. Talk to your prescriber to see if the following covered alternative(s) would be right for you:  Combivent Respimat (Quantity limit of 8 grams per 30 days) Form CMS-10146 OMB Approval No. [id number] (Expires 11/22/2021) Anoro Ellipta (Quantity limit of 60 each per 30 days) Bevespi Aerosphere (Quantity limit of 10.7 grams per 30 days  Dr. Lamonte Sakai, please advise.

## 2020-04-12 ENCOUNTER — Telehealth: Payer: Self-pay | Admitting: Emergency Medicine

## 2020-04-12 MED ORDER — STIOLTO RESPIMAT 2.5-2.5 MCG/ACT IN AERS: 2.0000 | INHALATION_SPRAY | Freq: Every day | RESPIRATORY_TRACT | 0 refills | Status: DC

## 2020-04-12 NOTE — Telephone Encounter (Signed)
PLease reply that the patient has been clinically stable on Stiolto, has been less-well-controlled on alternatives in the past and that it is my medical opinion that change to a formulary alternative would result in poorer control of her COPD and associated symptoms.

## 2020-04-12 NOTE — Telephone Encounter (Signed)
Appeal was done for Abigail Castillo's Stiolto inhaler after PA had been denied.  Received the below response from the appeal:  Your appeal has been approved.  Called and spoke with Abigail Castillo's daughter Duwaine Maxin letting her know this info and she verbalized understanding. Nothing further needed.

## 2020-04-12 NOTE — Telephone Encounter (Signed)
Pt's daughter following up regarding the PA for Stiolto.  Let her know PA was denied and information sent to Dr. Lamonte Sakai.  Please advise. [phone]

## 2020-04-12 NOTE — Telephone Encounter (Signed)
Called and spoke with pt's daughter about the PA status and stated that we were going to do an appeal to try to get the denial overturned and she verbalized understanding. Pt has been scheduled for a f/u appt and stated to her at that appt that we could give pt a sample of stiolto and she verbalized understanding. Sample has been placed aside for pt as we have a low supply of Stiolto samples. Nothing further needed.

## 2020-04-12 NOTE — Telephone Encounter (Signed)
Called and spoke with pt's daughter about O2 recertification needed. Stated to [facility] that we needed to schedule pt for a follow up OV and then we could get her requalified for O2. appt has been scheduled for pt with RB 11/22. Nothing further needed.

## 2020-04-16 ENCOUNTER — Encounter: Payer: Self-pay | Admitting: Emergency Medicine

## 2020-04-16 ENCOUNTER — Other Ambulatory Visit: Payer: Self-pay

## 2020-04-16 ENCOUNTER — Ambulatory Visit (INDEPENDENT_AMBULATORY_CARE_PROVIDER_SITE_OTHER): Payer: Medicare Other | Admitting: Emergency Medicine

## 2020-04-16 VITALS — BP 112/70 | HR 106 | Temp 98.8°F | Ht 62.5 in | Wt 146.6 lb

## 2020-04-16 DIAGNOSIS — J449 Chronic obstructive pulmonary disease, unspecified: Secondary | ICD-10-CM

## 2020-04-16 DIAGNOSIS — J9611 Chronic respiratory failure with hypoxia: Secondary | ICD-10-CM

## 2020-04-16 DIAGNOSIS — Z23 Encounter for immunization: Secondary | ICD-10-CM

## 2020-04-16 NOTE — Assessment & Plan Note (Signed)
Continue oxygen at 2 L/min with exertion.  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

## 2020-04-16 NOTE — Progress Notes (Signed)
Subjective:    Patient ID: Abigail Castillo, female    DOB: [birth year], 69 y.o.   MRN: [id number]  COPD She complains of shortness of breath. There is no cough or wheezing. Pertinent negatives include no ear pain, fever, headaches, postnasal drip, rhinorrhea, sneezing, sore throat or trouble swallowing. Her past medical history is significant for COPD.    [name] 06/08/19 --69 year old woman who follows up today for severe COPD and associated exertional hypoxemia on 2 L/min..  She also has intermittent nasal drainage.  Currently managed on Stiolto.  Uses albuterol 1-3x a day.  Has not had any hospital visits, exacerbations or prednisone. She has occasional cough, mucous. Thick clear. She is able to get around but has to pace herself. Some difficulty with her O2 hose limiting her. Nasal congestion in the am, interested in NSW's. She is interested in a POC. Flu shot is up to date.   [name] 04/16/20--69 year old woman with a history of severe COPD and associate exertional hypoxemia.  She also has allergic rhinitis.  We have been managing her on Stiolto.  She ran out of it about 2 months ago, had to have an appeal for a rejected PA. Now has it, and just restarted. Hasn't gotten back to her usual functional capacity yet. She is having dyspnea w housework. Was treated with pred after her Stiolto ran out for flaring symptoms.  She has albuterol which she uses approximately bid.  She is due to be requalified for oxygen, currently uses 2L/min Has not been COVID vaccinated  Needs Flu shot    Review of Systems  Constitutional: Negative for fever and unexpected weight change.  HENT: Negative for congestion, dental problem, ear pain, nosebleeds, postnasal drip, rhinorrhea, sinus pressure, sneezing, sore throat and trouble swallowing.   Eyes: Negative for redness and itching.  Respiratory: Positive for shortness of breath. Negative for cough, chest tightness and wheezing.   Cardiovascular: Negative for palpitations  and leg swelling.  Gastrointestinal: Negative for nausea and vomiting.  Genitourinary: Negative for dysuria.  Musculoskeletal: Negative for joint swelling.  Skin: Negative for rash.  Neurological: Negative for headaches.  Hematological: Does not bruise/bleed easily.  Psychiatric/Behavioral: Negative for dysphoric mood. The patient is not nervous/anxious.        Objective:   Physical Exam  Vitals:   04/16/20 1005  BP: 112/70  Pulse: (!) 106  Temp: 98.8 F (37.1 C)  SpO2: 93%  Weight: 146 lb 9.6 oz (66.5 kg)  Height: 5' 2.5" (1.588 m)   Gen: Pleasant, well-nourished, in no distress  ENT: No lesions,  Edentulous, mouth clear,  oropharynx clear, no postnasal drip  Neck: No JVD, no stridor  Lungs: No use of accessory muscles, distant,   Cardiovascular: RRR, heart sounds normal, no murmur or gallops, no peripheral edema  Musculoskeletal: No deformities, no cyanosis or clubbing  Neuro: alert, non focal, oriented   Skin: Warm, no lesions or rashes       Assessment & Plan:  COPD (chronic obstructive pulmonary disease) (HCC) Please continue Stiolto 2 puffs once a day.  We will ensure that you have refills for 1 year Keep your albuterol available to use 2 puffs if needed for shortness of breath, chest tightness, wheezing. Flu shot today You would likely benefit from the COVID-19 vaccine.  Pursue this if you are comfortable doing so Follow with Dr Lamonte Sakai in 6 months or sooner if you have any problems  Chronic respiratory failure ([place]) Continue oxygen at 2 L/min with exertion.  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

## 2020-04-16 NOTE — Patient Instructions (Signed)
Please continue Stiolto 2 puffs once a day.  We will ensure that you have refills for 1 year Keep your albuterol available to use 2 puffs if needed for shortness of breath, chest tightness, wheezing. Continue oxygen at 2 L/min with exertion.  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  Pursue this if you are comfortable doing so Follow with Dr Lamonte Sakai in 6 months or sooner if you have any problems

## 2020-04-16 NOTE — Assessment & Plan Note (Signed)
Please continue Stiolto 2 puffs once a day.  We will ensure that you have refills for 1 year Keep your albuterol available to use 2 puffs if needed for shortness of breath, chest tightness, wheezing. Flu shot today You would likely benefit from the COVID-19 vaccine.  Pursue this if you are comfortable doing so Follow with Dr Lamonte Sakai in 6 months or sooner if you have any problems

## 2020-06-08 ENCOUNTER — Telehealth: Payer: Self-pay | Admitting: Emergency Medicine

## 2020-06-08 MED ORDER — STIOLTO RESPIMAT 2.5-2.5 MCG/ACT IN AERS: 2.0000 | INHALATION_SPRAY | Freq: Every day | RESPIRATORY_TRACT | 5 refills | Status: DC

## 2020-06-08 NOTE — Telephone Encounter (Signed)
Rx for pt's stiolto inhaler has been sent to preferred pharmacy for pt. Called and spoke with pt letting her know this had been done and she verbalized understanding. Nothing further needed.

## 2020-08-08 DIAGNOSIS — F259 Schizoaffective disorder, unspecified: Secondary | ICD-10-CM | POA: Diagnosis not present

## 2020-08-08 DIAGNOSIS — F411 Generalized anxiety disorder: Secondary | ICD-10-CM | POA: Diagnosis not present

## 2020-08-08 DIAGNOSIS — Z79899 Other long term (current) drug therapy: Secondary | ICD-10-CM | POA: Diagnosis not present

## 2020-08-10 ENCOUNTER — Telehealth: Payer: Self-pay | Admitting: Family

## 2020-08-10 MED ORDER — VITAMIN B-12 1000 MCG PO TABS: 1000.0000 ug | ORAL_TABLET | Freq: Every day | ORAL | 0 refills | Status: DC

## 2020-08-10 MED ORDER — DEXLANSOPRAZOLE 60 MG PO CPDR: 1.0000 | DELAYED_RELEASE_CAPSULE | Freq: Every day | ORAL | 0 refills | Status: DC

## 2020-08-10 NOTE — Telephone Encounter (Signed)
rx was refilled for 30 days, patient has appointment coming up

## 2020-08-10 NOTE — Telephone Encounter (Signed)
Medication: vitamin B-12 (CYANOCOBALAMIN) 1000 MCG tablet  dexlansoprazole (DEXILANT) 60 MG capsule [[id number]]    Has the patient contacted their pharmacy? No. (If no, request that the patient contact the pharmacy for the refill.) (If yes, when and what did the pharmacy advise?)  Preferred Pharmacy (with phone number or street name):  [address], [place] [address]  Phone:  [phone] Fax:  [phone]  DEA #:  --  DAW Reason: --     Agent: Please be advised that RX refills may take up to 3 business days. We ask that you follow-up with your pharmacy.

## 2020-08-21 ENCOUNTER — Ambulatory Visit (INDEPENDENT_AMBULATORY_CARE_PROVIDER_SITE_OTHER): Payer: Medicare Other | Admitting: Family

## 2020-08-21 ENCOUNTER — Other Ambulatory Visit: Payer: Self-pay

## 2020-08-21 ENCOUNTER — Other Ambulatory Visit: Payer: Self-pay | Admitting: Family

## 2020-08-21 ENCOUNTER — Encounter: Payer: Self-pay | Admitting: Family

## 2020-08-21 VITALS — BP 152/104 | HR 59 | Temp 97.8°F | Resp 16 | Ht 62.5 in | Wt 151.0 lb

## 2020-08-21 DIAGNOSIS — Z2821 Immunization not carried out because of patient refusal: Secondary | ICD-10-CM | POA: Diagnosis not present

## 2020-08-21 DIAGNOSIS — E663 Overweight: Secondary | ICD-10-CM | POA: Diagnosis not present

## 2020-08-21 DIAGNOSIS — E538 Deficiency of other specified B group vitamins: Secondary | ICD-10-CM | POA: Diagnosis not present

## 2020-08-21 DIAGNOSIS — F32A Depression, unspecified: Secondary | ICD-10-CM | POA: Diagnosis not present

## 2020-08-21 DIAGNOSIS — R030 Elevated blood-pressure reading, without diagnosis of hypertension: Secondary | ICD-10-CM | POA: Diagnosis not present

## 2020-08-21 DIAGNOSIS — F259 Schizoaffective disorder, unspecified: Secondary | ICD-10-CM | POA: Diagnosis not present

## 2020-08-21 DIAGNOSIS — Z23 Encounter for immunization: Secondary | ICD-10-CM | POA: Diagnosis not present

## 2020-08-21 DIAGNOSIS — J449 Chronic obstructive pulmonary disease, unspecified: Secondary | ICD-10-CM

## 2020-08-21 LAB — LIPID PANEL
Cholesterol: 200 mg/dL (ref 0–200)
HDL: 38.7 mg/dL — ABNORMAL LOW (ref >39.00)
LDL Cholesterol: 132 mg/dL — ABNORMAL HIGH (ref 0–99)
NonHDL: 161.21
Total CHOL/HDL Ratio: 5
Triglycerides: 145 mg/dL (ref 0.0–149.0)
VLDL: 29 mg/dL (ref 0.0–40.0)

## 2020-08-21 LAB — VITAMIN B12: Vitamin B-12: >1506 pg/mL — ABNORMAL HIGH (ref 211–911)

## 2020-08-21 MED ORDER — TETANUS-DIPHTHERIA TOXOIDS TD 2-2 LF/0.5ML IM SUSP: 0.5000 mL | Freq: Once | INTRAMUSCULAR | 0 refills | Status: DC

## 2020-08-21 NOTE — Progress Notes (Signed)
Mailed

## 2020-08-21 NOTE — Progress Notes (Signed)
Subjective:    Patient ID: Abigail Castillo, female    DOB: 1951-01-13, 70 y.o.   MRN: [id number]  HPI  Patient is a 70 yr old female who presents today for follow up. She is accompanied by her daughter.  b12 deficiency- she is currently on oral b12 1013mcg once daily.   COPD- she is maintained on oxygen 2 liters continuously.   Depression/schizoaffective disorder- Sees Dr. Erling Cruz- psychiatry.  Reports mood is good.  She is maintained on zyprexa 5mg .   Lab Results  Component Value Date   CHOL 177 02/05/2015   HDL 41.40 02/05/2015   LDLCALC 110 (H) 02/05/2015   TRIG 94 07/05/2019   CHOLHDL 4 02/05/2015     Review of Systems See HPI  Past Medical History:  Diagnosis Date  . Anxiety   . Blood transfusion   . Blood transfusion without reported diagnosis   . Chronic mental illness   . COPD (chronic obstructive pulmonary disease) ([place])   . DEPRESSION 05/31/2009  . Emphysema of lung ([place])   . Esophageal stricture 03/14/2015  . History of home oxygen therapy   . History of pneumonia   . Osteoporosis   . Shortness of breath dyspnea   . Tobacco abuse    ongoing  . Vitamin B 12 deficiency   . Vitamin D deficiency      Social History   Socioeconomic History  . Marital status: Divorced    Spouse name: Not on file  . Number of children: 3  . Years of education: Not on file  . Highest education level: Not on file  Occupational History  . Occupation: disabled    Fish farm manager: UNEMPLOYED  Tobacco Use  . Smoking status: Former Smoker    Types: Cigarettes    Quit date: 08/12/2011    Years since quitting: 9.0  . Smokeless tobacco: Never Used  Substance and Sexual Activity  . Alcohol use: No    Alcohol/week: 0.0 standard drinks  . Drug use: No  . Sexual activity: Never  Other Topics Concern  . Not on file  Social History Narrative   Lost a son to Lung CA 1/18   Lives with a son in [place]   Has a daughter who lives locally   Social Determinants of Health    Financial Resource Strain: Low Risk   . Difficulty of Paying Living Expenses: Not hard at all  Food Insecurity: No Food Insecurity  . Worried About Charity fundraiser in the Last Year: Never true  . Ran Out of Food in the Last Year: Never true  Transportation Needs: No Transportation Needs  . Lack of Transportation (Medical): No  . Lack of Transportation (Non-Medical): No  Physical Activity: Not on file  Stress: Not on file  Social Connections: Not on file  Intimate Partner Violence: Not on file    Past Surgical History:  Procedure Laterality Date  . ABDOMINAL HYSTERECTOMY    . BREAST EXCISIONAL BIOPSY Left 2017  . BREAST LUMPECTOMY  1972   left breast  . BREAST LUMPECTOMY WITH RADIOACTIVE SEED LOCALIZATION Left 11/22/2015   Procedure: LEFT BREAST LUMPECTOMY WITH RADIOACTIVE SEED LOCALIZATION;  Surgeon: Erroll Luna, MD;  Location: [facility];  Service: General;  Laterality: Left;  . BREAST SURGERY  ?2015   lumpectomy, left  . CERVICAL CONE BIOPSY    . CHOLECYSTECTOMY    . COLONOSCOPY    . ESOPHAGOGASTRODUODENOSCOPY (EGD) WITH ESOPHAGEAL DILATION    . UPPER GASTROINTESTINAL ENDOSCOPY  Family History  Problem Relation Age of Onset  . Coronary artery disease Mother   . Diabetes Mother   . Hypertension Mother   . Coronary artery disease Father   . Diabetes Father        Grandfather  . Hypertension Father   . Stroke Father   . Other Father        colostomy bag  . Breast cancer Sister   . Cancer Other        niece--breast  . Diabetes Sister   . Hypertension Sister   . Mental illness Sister        x 4  . Esophageal cancer Neg Hx   . Stomach cancer Neg Hx   . Rectal cancer Neg Hx   . Colon cancer Neg Hx   . Colon polyps Neg Hx     Allergies  Allergen Reactions  . Sulfonamide Derivatives Hives    Current Outpatient Medications on File Prior to Visit  Medication Sig Dispense Refill  . albuterol (VENTOLIN HFA) 108 (90 Base) MCG/ACT inhaler INHALE TWO PUFFS  BY MOUTH INTO THE LUNGS EVERY 6 HOURS AS NEEDED FOR WHEEZING OR SHORTNESS OF BREATH 6.7 g 5  . cholecalciferol (VITAMIN D) 1000 UNITS tablet Take 3,000 Units by mouth daily.     Marland Kitchen dexlansoprazole (DEXILANT) 60 MG capsule Take 1 capsule (60 mg total) by mouth daily. 30 capsule 0  . LORazepam (ATIVAN) 1 MG tablet Take 1 mg by mouth 2 (two) times daily as needed for anxiety.     Marland Kitchen OLANZapine (ZYPREXA) 5 MG tablet Take 7.5 mg by mouth daily.     . OXYGEN Inhale 2 L into the lungs.     Marland Kitchen Spacer/Aero-Holding Chambers (AEROCHAMBER MV) inhaler Use as instructed 1 each 0  . Tiotropium Bromide-Olodaterol (STIOLTO RESPIMAT) 2.5-2.5 MCG/ACT AERS Inhale 2 puffs into the lungs daily. 4 g 5  . vitamin B-12 (CYANOCOBALAMIN) 1000 MCG tablet Take 1 tablet (1,000 mcg total) by mouth daily. 30 tablet 0   No current facility-administered medications on file prior to visit.    BP (!) 152/104 (BP Location: Right Arm, Patient Position: Sitting, Cuff Size: Small)   Pulse (!) 59   Temp 97.8 F (36.6 C) (Oral)   Resp 16   Ht 5' 2.5" (1.588 m)   Wt 151 lb (68.5 kg)   SpO2 98%   BMI 27.18 kg/m       Objective:   Physical Exam Constitutional:      Appearance: She is well-developed.  Cardiovascular:     Rate and Rhythm: Normal rate and regular rhythm.     Heart sounds: Normal heart sounds. No murmur heard.   Pulmonary:     Effort: Pulmonary effort is normal. No respiratory distress.     Breath sounds: Decreased breath sounds present. No wheezing.  Psychiatric:        Behavior: Behavior normal.        Thought Content: Thought content normal.        Judgment: Judgment normal.           Assessment & Plan:  COPD- unchanged. She is maintained on oxygen 2 liters continuously and continues to follow with pulmonology. Pneumovax 23 booster today.   b12 deficiency- check a follow up b12 level. Continue oral b12 1046mcg po daily.  Covid-19 Vaccine counseling- pt has not received any covid vaccination. We  discussed that she is at high risk for death and complications due to her age and COPD if  she remains unvaccinated. Pt declines vaccine.   Elevated blood pressure reading- bp is elevated today- recommended to daughter and patient that we recheck her bp in the office in 2-3 weeks. If still elevated, will need to consider anti-hypertensive.   BP Readings from Last 3 Encounters:  08/21/20 (!) 152/104  04/16/20 112/70  07/12/19 (!) 137/96   Depression/schizoaffective disorder- stable. Management per pulmonology.   This visit occurred during the SARS-CoV-2 public health emergency.  Safety protocols were in place, including screening questions prior to the visit, additional usage of staff PPE, and extensive cleaning of exam room while observing appropriate contact time as indicated for disinfecting solutions.

## 2020-08-21 NOTE — Patient Instructions (Signed)
Please complete lab work prior to leaving.  Please get your tetanus shot from the pharmacy.

## 2020-08-30 ENCOUNTER — Telehealth: Payer: Self-pay

## 2020-08-30 MED ORDER — TETANUS-DIPHTH-ACELL PERTUSSIS 5-2-15.5 LF-MCG/0.5 IM SUSP: 0.5000 mL | Freq: Once | INTRAMUSCULAR | 0 refills | Status: AC

## 2020-08-30 NOTE — Telephone Encounter (Signed)
Rx was sent. I resent an Rx for Booostrix.

## 2020-08-30 NOTE — Telephone Encounter (Signed)
Was the Tdap that was ordered at last OV to be given in office or as a Rx? [facility] sent a notification via fax saying that is would need a PA if it was truly a Rx.  Alternatives that would not need the PA: Boostrix and Adacel  Key: [facility]

## 2020-08-30 NOTE — Addendum Note (Signed)
Addended by: [name] on: 08/30/2020 04:02 PM   Modules accepted: Orders

## 2020-09-10 ENCOUNTER — Telehealth: Payer: Self-pay | Admitting: Emergency Medicine

## 2020-09-10 ENCOUNTER — Encounter: Payer: Self-pay | Admitting: *Deleted

## 2020-09-10 DIAGNOSIS — J449 Chronic obstructive pulmonary disease, unspecified: Secondary | ICD-10-CM

## 2020-09-10 NOTE — Telephone Encounter (Signed)
Yes please order

## 2020-09-10 NOTE — Telephone Encounter (Signed)
RB please advise if you are ok with sending in an rx to [employer] for the pt to get a wheelchair with an oxygen attachment.  Thanks   Pt was last seen by RB on 04/16/2020 Next ov is 09/25/2020

## 2020-09-10 NOTE — Telephone Encounter (Signed)
Spoke with pt who wanted to know when they would get the WC. Pt was informed that DME company would reach out to her and go over all those details with her. Pt stated understanding and no further needs at this time.

## 2020-09-10 NOTE — Telephone Encounter (Signed)
ATC patient x1.  Left vm to return call.  DME order placed for WC with oxygen attachement.

## 2020-09-14 ENCOUNTER — Other Ambulatory Visit: Payer: Self-pay

## 2020-09-14 ENCOUNTER — Ambulatory Visit (INDEPENDENT_AMBULATORY_CARE_PROVIDER_SITE_OTHER): Payer: Medicare Other | Admitting: Family

## 2020-09-14 DIAGNOSIS — R030 Elevated blood-pressure reading, without diagnosis of hypertension: Secondary | ICD-10-CM | POA: Diagnosis not present

## 2020-09-14 NOTE — Assessment & Plan Note (Signed)
Blood pressure today looks much better.  Will plan to continue to monitor and repeat in 3 months. She states that she is interested in weight loss. We discussed limiting portions, increasing activity, avoiding late night snacking.

## 2020-09-14 NOTE — Progress Notes (Signed)
Subjective:   By signing my name below, I, [name], attest that this documentation has been prepared under the direction and in the presence of [name], NP. 09/14/2020    Patient ID: Abigail Castillo, female    DOB: [birth year], 70 y.o.   MRN: [id number]  No chief complaint on file.   HPI Patient is in today for a office visit. She reports that she is occasionally short of breath but manages this with her inhaler. She notes that she is struggling to lose weight but has not changed her diet.  Past Medical History:  Diagnosis Date  . Anxiety   . Blood transfusion   . Blood transfusion without reported diagnosis   . Chronic mental illness   . COPD (chronic obstructive pulmonary disease) ([place])   . DEPRESSION 05/31/2009  . Emphysema of lung ([place])   . Esophageal stricture 03/14/2015  . History of home oxygen therapy   . History of pneumonia   . Osteoporosis   . Shortness of breath dyspnea   . Tobacco abuse    ongoing  . Vitamin B 12 deficiency   . Vitamin D deficiency     Past Surgical History:  Procedure Laterality Date  . ABDOMINAL HYSTERECTOMY    . BREAST EXCISIONAL BIOPSY Left 2017  . BREAST LUMPECTOMY  1972   left breast  . BREAST LUMPECTOMY WITH RADIOACTIVE SEED LOCALIZATION Left 11/22/2015   Procedure: LEFT BREAST LUMPECTOMY WITH RADIOACTIVE SEED LOCALIZATION;  Surgeon: Erroll Luna, MD;  Location: [facility];  Service: General;  Laterality: Left;  . BREAST SURGERY  ?2015   lumpectomy, left  . CERVICAL CONE BIOPSY    . CHOLECYSTECTOMY    . COLONOSCOPY    . ESOPHAGOGASTRODUODENOSCOPY (EGD) WITH ESOPHAGEAL DILATION    . UPPER GASTROINTESTINAL ENDOSCOPY      Family History  Problem Relation Age of Onset  . Coronary artery disease Mother   . Diabetes Mother   . Hypertension Mother   . Coronary artery disease Father   . Diabetes Father        Grandfather  . Hypertension Father   . Stroke Father   . Other Father        colostomy bag  . Breast cancer  Sister   . Cancer Other        niece--breast  . Diabetes Sister   . Hypertension Sister   . Mental illness Sister        x 4  . Esophageal cancer Neg Hx   . Stomach cancer Neg Hx   . Rectal cancer Neg Hx   . Colon cancer Neg Hx   . Colon polyps Neg Hx     Social History   Socioeconomic History  . Marital status: Divorced    Spouse name: Not on file  . Number of children: 3  . Years of education: Not on file  . Highest education level: Not on file  Occupational History  . Occupation: disabled    Fish farm manager: UNEMPLOYED  Tobacco Use  . Smoking status: Former Smoker    Types: Cigarettes    Quit date: 08/12/2011    Years since quitting: 9.0  . Smokeless tobacco: Never Used  Substance and Sexual Activity  . Alcohol use: No    Alcohol/week: 0.0 standard drinks  . Drug use: No  . Sexual activity: Never  Other Topics Concern  . Not on file  Social History Narrative   Lost a son to Lung CA 1/18   Lives with  a son in [place]   Has a daughter who lives locally   Social Determinants of Health   Financial Resource Strain: Low Risk   . Difficulty of Paying Living Expenses: Not hard at all  Food Insecurity: No Food Insecurity  . Worried About Charity fundraiser in the Last Year: Never true  . Ran Out of Food in the Last Year: Never true  Transportation Needs: No Transportation Needs  . Lack of Transportation (Medical): No  . Lack of Transportation (Non-Medical): No  Physical Activity: Not on file  Stress: Not on file  Social Connections: Not on file  Intimate Partner Violence: Not on file    Outpatient Medications Prior to Visit  Medication Sig Dispense Refill  . albuterol (VENTOLIN HFA) 108 (90 Base) MCG/ACT inhaler INHALE TWO PUFFS BY MOUTH INTO THE LUNGS EVERY 6 HOURS AS NEEDED FOR WHEEZING OR SHORTNESS OF BREATH 6.7 g 5  . cholecalciferol (VITAMIN D) 1000 UNITS tablet Take 3,000 Units by mouth daily.     Marland Kitchen dexlansoprazole (DEXILANT) 60 MG capsule Take 1 capsule  (60 mg total) by mouth daily. 30 capsule 0  . LORazepam (ATIVAN) 1 MG tablet Take 1 mg by mouth 2 (two) times daily as needed for anxiety.     Marland Kitchen OLANZapine (ZYPREXA) 5 MG tablet Take 7.5 mg by mouth daily.     . OXYGEN Inhale 2 L into the lungs.     Marland Kitchen Spacer/Aero-Holding Chambers (AEROCHAMBER MV) inhaler Use as instructed 1 each 0  . Tiotropium Bromide-Olodaterol (STIOLTO RESPIMAT) 2.5-2.5 MCG/ACT AERS Inhale 2 puffs into the lungs daily. 4 g 5  . vitamin B-12 (CYANOCOBALAMIN) 1000 MCG tablet Take 1 tablet (1,000 mcg total) by mouth daily. 30 tablet 0   No facility-administered medications prior to visit.    Allergies  Allergen Reactions  . Sulfonamide Derivatives Hives    ROS     Objective:    Physical Exam Constitutional:      Appearance: She is well-developed.  Neck:     Thyroid: No thyromegaly.  Cardiovascular:     Rate and Rhythm: Normal rate and regular rhythm.     Heart sounds: Normal heart sounds. No murmur heard.   Pulmonary:     Effort: No respiratory distress.     Breath sounds: No wheezing.     Comments: Diminished breath sounds throughout. Musculoskeletal:     Cervical back: Neck supple.  Skin:    General: Skin is warm and dry.  Neurological:     Mental Status: She is alert and oriented to person, place, and time.  Psychiatric:        Behavior: Behavior normal.        Thought Content: Thought content normal.        Judgment: Judgment normal.     There were no vitals taken for this visit. Wt Readings from Last 3 Encounters:  08/21/20 151 lb (68.5 kg)  04/16/20 146 lb 9.6 oz (66.5 kg)  07/07/19 136 lb 9.6 oz (62 kg)    Diabetic Foot Exam - Simple   No data filed    Lab Results  Component Value Date   WBC 10.6 (H) 07/08/2019   HGB 12.9 07/08/2019   HCT 39.5 07/08/2019   PLT 303 07/08/2019   GLUCOSE 156 (H) 07/08/2019   CHOL 200 08/21/2020   TRIG 145.0 08/21/2020   HDL 38.70 (L) 08/21/2020   LDLCALC 132 (H) 08/21/2020   ALT 18 07/08/2019    AST 16 07/08/2019  NA 142 07/08/2019   K 3.4 (L) 07/08/2019   CL 103 07/08/2019   CREATININE 0.67 07/08/2019   BUN 28 (H) 07/08/2019   CO2 29 07/08/2019   TSH 0.49 07/13/2015   INR 1.4 11/09/2008   HGBA1C 5.4 02/03/2014    Lab Results  Component Value Date   TSH 0.49 07/13/2015   Lab Results  Component Value Date   WBC 10.6 (H) 07/08/2019   HGB 12.9 07/08/2019   HCT 39.5 07/08/2019   MCV 95.4 07/08/2019   PLT 303 07/08/2019   Lab Results  Component Value Date   NA 142 07/08/2019   K 3.4 (L) 07/08/2019   CO2 29 07/08/2019   GLUCOSE 156 (H) 07/08/2019   BUN 28 (H) 07/08/2019   CREATININE 0.67 07/08/2019   BILITOT 0.4 07/08/2019   ALKPHOS 81 07/08/2019   AST 16 07/08/2019   ALT 18 07/08/2019   PROT 6.2 (L) 07/08/2019   ALBUMIN 3.2 (L) 07/08/2019   CALCIUM 8.8 (L) 07/08/2019   ANIONGAP 10 07/08/2019   GFR 85.36 01/16/2016   Lab Results  Component Value Date   CHOL 200 08/21/2020   Lab Results  Component Value Date   HDL 38.70 (L) 08/21/2020   Lab Results  Component Value Date   LDLCALC 132 (H) 08/21/2020   Lab Results  Component Value Date   TRIG 145.0 08/21/2020   Lab Results  Component Value Date   CHOLHDL 5 08/21/2020   Lab Results  Component Value Date   HGBA1C 5.4 02/03/2014       Assessment & Plan:   Problem List Items Addressed This Visit   None      No orders of the defined types were placed in this encounter.   I, Shehryar [place], personally preformed the services described in this documentation.  All medical record entries made by the scribe were at my direction and in my presence.  I have reviewed the chart and discharge instructions (if applicable) and agree that the record reflects my personal performance and is accurate and complete. 09/14/2020   I,[name],acting as a scribe for Nance Pear, NP.,have documented all relevant documentation on the behalf of Nance Pear, NP,as directed by  Nance Pear, NP while in the presence of Nance Pear, NP.   Shehryar Greenfield, NP, have reviewed all documentation for this visit. The documentation on 09/14/20 for the exam, diagnosis, procedures, and orders are all accurate and complete.

## 2020-09-17 ENCOUNTER — Other Ambulatory Visit: Payer: Self-pay | Admitting: Family

## 2020-09-18 ENCOUNTER — Telehealth: Payer: Self-pay | Admitting: Emergency Medicine

## 2020-09-18 DIAGNOSIS — J449 Chronic obstructive pulmonary disease, unspecified: Secondary | ICD-10-CM

## 2020-09-18 DIAGNOSIS — J9611 Chronic respiratory failure with hypoxia: Secondary | ICD-10-CM

## 2020-09-18 NOTE — Telephone Encounter (Signed)
ATC patient unable to reach LM to call back office (x1)  

## 2020-09-18 NOTE — Telephone Encounter (Signed)
Patient last b12 checked on 08/21/20 and was >1506.  Do you want her to continue?

## 2020-09-19 NOTE — Telephone Encounter (Signed)
Pt returning a phone call. Pt can be reached at [phone].

## 2020-09-19 NOTE — Telephone Encounter (Signed)
Spoke with patient. She stated that she had received a call from [employer] stating that they needed more information in regards to her wheelchair. I looked at the order that was placed on 09/10/20. It was placed without using the wheelchair smartphrase. I advised her that I would place the correct order. She verbalized understanding.

## 2020-09-25 ENCOUNTER — Other Ambulatory Visit: Payer: Self-pay

## 2020-09-25 ENCOUNTER — Encounter: Payer: Self-pay | Admitting: Emergency Medicine

## 2020-09-25 ENCOUNTER — Ambulatory Visit (INDEPENDENT_AMBULATORY_CARE_PROVIDER_SITE_OTHER): Payer: Medicare Other | Admitting: Emergency Medicine

## 2020-09-25 DIAGNOSIS — J9611 Chronic respiratory failure with hypoxia: Secondary | ICD-10-CM | POA: Diagnosis not present

## 2020-09-25 DIAGNOSIS — J449 Chronic obstructive pulmonary disease, unspecified: Secondary | ICD-10-CM

## 2020-09-25 NOTE — Progress Notes (Signed)
Subjective:    Patient ID: Abigail Castillo, female    DOB: [birth year], 70 y.o.   MRN: [id number]  COPD She complains of shortness of breath. There is no cough or wheezing. Pertinent negatives include no ear pain, fever, headaches, postnasal drip, rhinorrhea, sneezing, sore throat or trouble swallowing. Her past medical history is significant for COPD.    [name] 06/08/19 --70 year old woman who follows up today for severe COPD and associated exertional hypoxemia on 2 L/min..  She also has intermittent nasal drainage.  Currently managed on Stiolto.  Uses albuterol 1-3x a day.  Has not had any hospital visits, exacerbations or prednisone. She has occasional cough, mucous. Thick clear. She is able to get around but has to pace herself. Some difficulty with her O2 hose limiting her. Nasal congestion in the am, interested in NSW's. She is interested in a POC. Flu shot is up to date.   [name] 04/16/20--70 year old woman with a history of severe COPD and associate exertional hypoxemia.  She also has allergic rhinitis.  We have been managing her on Stiolto.  She ran out of it about 2 months ago, had to have an appeal for a rejected PA. Now has it, and just restarted. Hasn't gotten back to her usual functional capacity yet. She is having dyspnea w housework. Was treated with pred after her Stiolto ran out for flaring symptoms.  She has albuterol which she uses approximately bid.  She is due to be requalified for oxygen, currently uses 2L/min Has not been COVID vaccinated  Needs Flu shot   [name] 09/25/20 --Ms. Ventress is a 70, follows up for her severe COPD with associated exertional hypoxemia, allergic rhinitis.  We have been working on trying to get her a wheelchair with oxygen attachment. She has been managed on Stiolto, albuterol which she uses nebs a few times a day. No flares or prednisone. dealing with nasal congestion and drainage.  She has not had COVID since last visit (had it in Feb 2021), is not  vaccinated.  Oxygen 2 L/min with exertion, sometimes also at rest.   Order for Standard Wheelchair: Patient suffers from severe COPD and chronic hypoxemia which impairs their ability to perform daily activities like ambulating through the home, preparing meals, self care in the home.  A walker will not resolve  issue with performing activities of daily living. A wheelchair will allow patient to safely perform daily activities. Patient can safely propel the wheelchair in the home or has a caregiver who can provide assistance.  Accessories: elevating leg rests (ELRs), wheel locks, extensions and anti-tippers and a place to attach / carry her oxygen tanks.      Review of Systems  Constitutional: Negative for fever and unexpected weight change.  HENT: Negative for congestion, dental problem, ear pain, nosebleeds, postnasal drip, rhinorrhea, sinus pressure, sneezing, sore throat and trouble swallowing.   Eyes: Negative for redness and itching.  Respiratory: Positive for shortness of breath. Negative for cough, chest tightness and wheezing.   Cardiovascular: Negative for palpitations and leg swelling.  Gastrointestinal: Negative for nausea and vomiting.  Genitourinary: Negative for dysuria.  Musculoskeletal: Negative for joint swelling.  Skin: Negative for rash.  Neurological: Negative for headaches.  Hematological: Does not bruise/bleed easily.  Psychiatric/Behavioral: Negative for dysphoric mood. The patient is not nervous/anxious.        Objective:   Physical Exam  Vitals:   09/25/20 1022  BP: 108/68  Pulse: 97  Temp: 98.1 F (36.7 C)  TempSrc: Temporal  SpO2: 96%  Weight: 151 lb 12.8 oz (68.9 kg)  Height: 5\' 2"  (1.575 m)   Gen: Pleasant, well-nourished, in no distress  ENT: No lesions,  Edentulous, mouth clear,  oropharynx clear, no postnasal drip  Neck: No JVD, no stridor  Lungs: No use of accessory muscles, distant,   Cardiovascular: RRR, heart sounds normal, no  murmur or gallops, no peripheral edema  Musculoskeletal: No deformities, no cyanosis or clubbing  Neuro: alert, non focal, oriented   Skin: Warm, no lesions or rashes       Assessment & Plan:  COPD (chronic obstructive pulmonary disease) (H[facility]) Overall doing well although some increased cough and albuterol use during the allergy season.  We talked about possibly adding allergy medications but she wants to hold off for now.  No flares, no prednisone.  Continue her current regimen as below  Please continue Stiolto 2 puffs once daily as you have been using it. Keep your albuterol available use either 1 nebulizer treatment or 2 puffs when you need it for shortness of breath, chest tightness, wheezing. We will work on obtaining a wheelchair with attachment to carry your oxygen You should consider getting the COVID-19 vaccine when it becomes seasonal, along with a flu vaccine Follow with Dr Lamonte Sakai in 6 months or sooner if you have any problems  Chronic respiratory failure ([place]) Continue your oxygen set at 2 L/min with exertion  Baltazar Apo, MD, PhD 09/25/2020, 10:41 AM [facility] [phone] or if no answer [phone]

## 2020-09-25 NOTE — Assessment & Plan Note (Signed)
Continue your oxygen set at 2 L/min with exertion

## 2020-09-25 NOTE — Patient Instructions (Addendum)
Please continue Stiolto 2 puffs once daily as you have been using it. Keep your albuterol available use either 1 nebulizer treatment or 2 puffs when you need it for shortness of breath, chest tightness, wheezing. Continue your oxygen set at 2 L/min with exertion We will work on obtaining a wheelchair with attachment to carry your oxygen You should consider getting the COVID-19 vaccine when it becomes seasonal, along with a flu vaccine Follow with Dr Lamonte Sakai in 6 months or sooner if you have any problems

## 2020-09-25 NOTE — Assessment & Plan Note (Signed)
Overall doing well although some increased cough and albuterol use during the allergy season.  We talked about possibly adding allergy medications but she wants to hold off for now.  No flares, no prednisone.  Continue her current regimen as below  Please continue Stiolto 2 puffs once daily as you have been using it. Keep your albuterol available use either 1 nebulizer treatment or 2 puffs when you need it for shortness of breath, chest tightness, wheezing. We will work on obtaining a wheelchair with attachment to carry your oxygen You should consider getting the COVID-19 vaccine when it becomes seasonal, along with a flu vaccine Follow with Dr Lamonte Sakai in 6 months or sooner if you have any problems

## 2020-09-27 ENCOUNTER — Telehealth: Payer: Self-pay | Admitting: Emergency Medicine

## 2020-09-27 NOTE — Telephone Encounter (Signed)
Pt is calling in regards to getting her wheelchair yesterday but she stated that it did not come with the attachment to carry her oxygen. Pls regard; [phone]

## 2020-09-27 NOTE — Telephone Encounter (Signed)
Called and spoke with Patient.  Patient stated she received her wheelchair yesterday from [place], but did not receive the O2 holder for the chair. Called [employer] and was told that Patient would have to purchase O2 holder herself. I have sent a staff message to [facility] to clarify if we need to place a separate order for a O2 holder, was it left off, or does the Patient have to order it herself?

## 2020-10-01 NOTE — Telephone Encounter (Signed)
Melissa isnt able to respond as quickly as before. New message sent to [facility] with [employer] to check on O2 holder issue.

## 2020-10-05 NOTE — Telephone Encounter (Signed)
Found response in Abigail Castillo's community message: "Hello,   Yes that is correct, wc bags are private pay retail items.   Thanks  "   Called and spoke with patient. She verbalized understanding. I also advised her that she could try looking online to see if she could find one since she'd have to pay out of pocket for it. She verbalized understanding.   Nothing further needed at time of call.

## 2020-10-23 ENCOUNTER — Telehealth: Payer: Self-pay | Admitting: *Deleted

## 2020-10-23 NOTE — Chronic Care Management (AMB) (Signed)
  Chronic Care Management   Outreach Note  10/23/2020 Name: Abigail Castillo MRN: [id number] DOB: 11-22-1950  Abigail Castillo is a 70 y.o. year old female who is a primary care patient of Abigail Alar, Abigail Castillo. I reached out to Abigail Castillo by phone today in response to a referral sent by Abigail Castillo's PCP, Abigail Alar, Abigail Castillo.     An unsuccessful telephone outreach was attempted today. The patient was referred to the case management team for assistance with care management and care coordination.   Follow Up Plan: A HIPAA compliant phone message was left for the patient providing contact information and requesting a return call. The care management team will reach out to the patient again over the next 7 days. If patient returns call to provider office, please advise to call [place] at [phone].  Abigail Castillo Management

## 2020-10-23 NOTE — Chronic Care Management (AMB) (Signed)
  Chronic Care Management   Note  10/23/2020 Name: Abigail Castillo MRN: [id number] DOB: 1950/10/22  Abigail Castillo is a 70 y.o. year old female who is a primary care patient of [name], NP. I reached out to Abigail Castillo by phone today in response to a referral sent by Abigail Castillo's PCP, [name], NP.     [name] was given information about Chronic Care Management services today including:  1. CCM service includes personalized support from designated clinical staff supervised by her physician, including individualized plan of care and coordination with other care providers 2. 24/7 contact phone numbers for assistance for urgent and routine care needs. 3. Service will only be billed when office clinical staff spend 20 minutes or more in a month to coordinate care. 4. Only one practitioner may furnish and bill the service in a calendar month. 5. The patient may stop CCM services at any time (effective at the end of the month) by phone call to the office staff. 6. The patient will be responsible for cost sharing (co-pay) of up to 20% of the service fee (after annual deductible is met).  Patient did not agree to enrollment in care management services and does not wish to consider at this time.  Follow up plan: Patient declines further follow up and engagement by the care management team. Appropriate care team members and provider have been notified via electronic communication. The care management team is available to follow up with the patient after provider conversation with the patient regarding recommendation for care management engagement and subsequent re-referral to the care management team.   [place] Management

## 2020-10-23 NOTE — Telephone Encounter (Signed)
Patient called stating she did not have a message on her voice mail from anyone but someone had called her from [facility].  At the time I did not recognize anyone calling from our office while I had the patient on the phone.  I went back and looked in the patient's chart and saw where [facility] had called the patient and then called her back and left a detailed message with all of that information on patient's voice mail for her so that she will have it to call them back.

## 2020-10-29 ENCOUNTER — Telehealth: Payer: Self-pay | Admitting: *Deleted

## 2020-10-29 NOTE — Chronic Care Management (AMB) (Signed)
  Chronic Care Management   Outreach Note  10/29/2020 Name: Abigail Castillo MRN: [id number] DOB: January 31, 1951  Abigail Castillo is a 70 y.o. year old female who is a primary care patient of Abigail Alar, Abigail Castillo. I reached out to Abigail Castillo by phone today in response to a referral sent by Abigail Castillo PCP, Abigail Alar, Abigail Castillo.     An unsuccessful telephone outreach was attempted today. The patient was referred to the case management team for assistance with care management and care coordination.   Follow Up Plan: A HIPAA compliant phone message was left for the patient providing contact information and requesting a return call. The care management team will reach out to the patient again over the next 7 days. If patient returns call to provider office, please advise to call [place] at [phone].  [place] Management

## 2020-10-29 NOTE — Chronic Care Management (AMB) (Signed)
  Chronic Care Management   Note  10/29/2020 Name: Abigail Castillo MRN: [id number] DOB: 1950-12-15  Abigail Castillo is a 70 y.o. year old female who is a primary care patient of [name], NP. Sundra Aland reached out to careguide by phone today in response to scheduling an initial call with [facility]  Abigail Castillo was given information about Chronic Care Management services today including:  1. CCM service includes personalized support from designated clinical staff supervised by her physician, including individualized plan of care and coordination with other care providers 2. 24/7 contact phone numbers for assistance for urgent and routine care needs. 3. Service will only be billed when office clinical staff spend 20 minutes or more in a month to coordinate care. 4. Only one practitioner may furnish and bill the service in a calendar month. 5. The patient may stop CCM services at any time (effective at the end of the month) by phone call to the office staff. 6. The patient will be responsible for cost sharing (co-pay) of up to 20% of the service fee (after annual deductible is met).  Patient did not agree to enrollment in care management services and does not wish to consider at this time.  Follow up plan: Patient declined engagement by the care management team X2. Appropriate care team members and provider have been notified via electronic communication.   [place] Management

## 2020-11-09 NOTE — Chronic Care Management (AMB) (Signed)
  Chronic Care Management   Outreach Note  11/09/2020 Name: Abigail Castillo MRN: [id number] DOB: 02-21-1951  Abigail Castillo is a 70 y.o. year old female who is a primary care patient of [name], NP. I reached out to Abigail Castillo by phone today in response to a referral sent by Abigail Castillo PCP, [name], NP      A telephone outreach was attempted today. The patient was referred to the case management team for assistance with care management and care coordination.   Follow Up Plan: Provided contact information and requesting a return call. The care management team will reach out to the patient again over the next 7 days. If patient returns call to provider office, please advise to call [place] at [phone].  [place] [facility]

## 2020-11-09 NOTE — Chronic Care Management (AMB) (Signed)
  Chronic Care Management   Note  11/09/2020 Name: CARL BLEECKER MRN: [id number] DOB: Sep 24, 1950  CARY WILFORD is a 70 y.o. year old female who is a primary care patient of [name], NP. I reached out to Sundra Aland by phone today in response to a referral sent by Ms. Vincente Poli Wissmann's PCP, [name], NP.      Ms. Orchard was given information about Chronic Care Management services today including:  CCM service includes personalized support from designated clinical staff supervised by her physician, including individualized plan of care and coordination with other care providers 24/7 contact phone numbers for assistance for urgent and routine care needs. Service will only be billed when office clinical staff spend 20 minutes or more in a month to coordinate care. Only one practitioner may furnish and bill the service in a calendar month. The patient may stop CCM services at any time (effective at the end of the month) by phone call to the office staff. The patient will be responsible for cost sharing (co-pay) of up to 20% of the service fee (after annual deductible is met).  Patient did not agree to enrollment in care management services and does not wish to consider at this time.  Follow up plan: Patient declines further follow up and engagement by the care management team. Appropriate care team members and provider have been notified via electronic communication.   [place] [facility]

## 2020-11-21 ENCOUNTER — Telehealth: Payer: Self-pay | Admitting: Family

## 2020-11-21 MED ORDER — OMEPRAZOLE 40 MG PO CPDR: 40.0000 mg | DELAYED_RELEASE_CAPSULE | Freq: Every day | ORAL | 3 refills | Status: DC

## 2020-11-21 NOTE — Telephone Encounter (Signed)
Caller states her mother lost her meds for DEXILANT 60 MG capsule.  She wants to know if it's something over the counter she can take or if another type of med could me sent over for her. Please advise

## 2020-11-21 NOTE — Telephone Encounter (Signed)
Patient notified

## 2020-11-21 NOTE — Telephone Encounter (Signed)
Rx sent to [facility] for omeprazole once daily in place of dexilant.

## 2020-12-03 ENCOUNTER — Other Ambulatory Visit: Payer: Self-pay | Admitting: Emergency Medicine

## 2020-12-17 ENCOUNTER — Ambulatory Visit: Payer: Medicare Other | Admitting: Family

## 2020-12-24 ENCOUNTER — Ambulatory Visit: Payer: Medicare Other | Admitting: Family

## 2020-12-28 ENCOUNTER — Telehealth: Payer: Self-pay | Admitting: Family

## 2020-12-28 NOTE — Telephone Encounter (Signed)
Can you please call pt's pharmacy and ask who is prescribing dexilant for her?  tks

## 2021-01-01 NOTE — Telephone Encounter (Signed)
Called patient and advised her of provider's comments, she verbalized understanding that she is supposed to be on Omeprazole only.

## 2021-01-01 NOTE — Telephone Encounter (Signed)
Pharmacy said that she picked up rx's for both.  Dexilant was discontinued back in March. She should be taking omeprazole only. Please advise pt.

## 2021-02-01 DIAGNOSIS — Z79899 Other long term (current) drug therapy: Secondary | ICD-10-CM | POA: Diagnosis not present

## 2021-02-01 DIAGNOSIS — F251 Schizoaffective disorder, depressive type: Secondary | ICD-10-CM | POA: Diagnosis not present

## 2021-02-01 DIAGNOSIS — F411 Generalized anxiety disorder: Secondary | ICD-10-CM | POA: Diagnosis not present

## 2021-02-15 ENCOUNTER — Other Ambulatory Visit: Payer: Self-pay | Admitting: Family

## 2021-02-15 DIAGNOSIS — Z1231 Encounter for screening mammogram for malignant neoplasm of breast: Secondary | ICD-10-CM

## 2021-02-20 ENCOUNTER — Telehealth: Payer: Self-pay | Admitting: Emergency Medicine

## 2021-02-20 NOTE — Telephone Encounter (Signed)
Community message response from [place] at [employer]:   The patient has declined to begin a new rental period for their O2 equipment. Ownership of the equipment has been transferred to the patient and all associated maintenance and supply costs are patient responsibility and will be handled by customer service. If the patient decides to restart their rental period please email [email or web address] or call [phone]    Gave patient number to call and patient is going to call to get it taken care of. Nothing further needed.

## 2021-02-20 NOTE — Telephone Encounter (Signed)
I called and spoke with patient regarding message. Patient stated someone called from [facility] and notified patient to call our office about her oxygen. Patient stated they did not state why. I have message Danielle at [employer] to see what is needed and will let patient know. Will keep in triage for f/u

## 2021-02-22 ENCOUNTER — Ambulatory Visit (INDEPENDENT_AMBULATORY_CARE_PROVIDER_SITE_OTHER): Payer: Medicare Other | Admitting: Family

## 2021-02-22 ENCOUNTER — Encounter: Payer: Self-pay | Admitting: Family

## 2021-02-22 ENCOUNTER — Other Ambulatory Visit: Payer: Self-pay

## 2021-02-22 ENCOUNTER — Telehealth (HOSPITAL_BASED_OUTPATIENT_CLINIC_OR_DEPARTMENT_OTHER): Payer: Self-pay

## 2021-02-22 VITALS — BP 145/92 | HR 99 | Temp 98.0°F | Resp 16 | Wt 149.0 lb

## 2021-02-22 DIAGNOSIS — E538 Deficiency of other specified B group vitamins: Secondary | ICD-10-CM | POA: Diagnosis not present

## 2021-02-22 DIAGNOSIS — E559 Vitamin D deficiency, unspecified: Secondary | ICD-10-CM

## 2021-02-22 DIAGNOSIS — M810 Age-related osteoporosis without current pathological fracture: Secondary | ICD-10-CM

## 2021-02-22 DIAGNOSIS — F259 Schizoaffective disorder, unspecified: Secondary | ICD-10-CM

## 2021-02-22 DIAGNOSIS — Z23 Encounter for immunization: Secondary | ICD-10-CM | POA: Diagnosis not present

## 2021-02-22 DIAGNOSIS — R030 Elevated blood-pressure reading, without diagnosis of hypertension: Secondary | ICD-10-CM | POA: Diagnosis not present

## 2021-02-22 DIAGNOSIS — J449 Chronic obstructive pulmonary disease, unspecified: Secondary | ICD-10-CM | POA: Diagnosis not present

## 2021-02-22 LAB — COMPREHENSIVE METABOLIC PANEL
ALT: 38 U/L — ABNORMAL HIGH (ref 0–35)
AST: 43 U/L — ABNORMAL HIGH (ref 0–37)
Albumin: 4.3 g/dL (ref 3.5–5.2)
Alkaline Phosphatase: 109 U/L (ref 39–117)
BUN: 14 mg/dL (ref 6–23)
CO2: 32 mEq/L (ref 19–32)
Calcium: 9.5 mg/dL (ref 8.4–10.5)
Chloride: 99 mEq/L (ref 96–112)
Creatinine, Ser: 0.75 mg/dL (ref 0.40–1.20)
GFR: 81.52 mL/min (ref >60.00)
Glucose, Bld: 96 mg/dL (ref 70–99)
Potassium: 4.5 mEq/L (ref 3.5–5.1)
Sodium: 139 mEq/L (ref 135–145)
Total Bilirubin: 1.7 mg/dL — ABNORMAL HIGH (ref 0.2–1.2)
Total Protein: 6.6 g/dL (ref 6.0–8.3)

## 2021-02-22 LAB — VITAMIN D 25 HYDROXY (VIT D DEFICIENCY, FRACTURES): VITD: 87.46 ng/mL (ref 30.00–100.00)

## 2021-02-22 LAB — VITAMIN B12: Vitamin B-12: >1550 pg/mL — ABNORMAL HIGH (ref 211–911)

## 2021-02-22 MED ORDER — CALCIUM CARBONATE-VITAMIN D 600-400 MG-UNIT PO TABS: 1.0000 | ORAL_TABLET | Freq: Two times a day (BID) | ORAL | Status: DC

## 2021-02-22 NOTE — Patient Instructions (Signed)
Please complete lab work prior to leaving.   

## 2021-02-22 NOTE — Progress Notes (Signed)
Subjective:     Patient ID: Abigail Castillo, female    DOB: 08-Feb-1951, 70 y.o.   MRN: [id number]  Chief Complaint  Patient presents with   COPD    Here for follow up    B12 deficiency    Taking OTC B12     COPD Her past medical history is significant for COPD.  Patient is in today for follow up.   Elevated blood pressure reading-  BP Readings from Last 3 Encounters:  02/22/21 (!) 145/92  09/25/20 108/68  09/14/20 139/87   COPD- followed by pulmonology- Dr.  Lamonte Sakai and maintained on Stiolto Respimat. Reports chronic SOB which is about the same. She is maintained on oxygen 2L via nasal cannula.    B12 deficiency- continues b12 1065mg PO daily.   Schizoaffective disorder- followed by psychiatry- Dr. LErling Cruzand maintained on zyprex and pr lorazepam.   Osteoporosis- She is not currently on fosamax. She had been on/off for many years. Vit d deficiency- She continues calcium.   Health Maintenance Due  Topic Date Due   Zoster Vaccines- Shingrix (1 of 2) Never done   TETANUS/TDAP  05/26/2018   MAMMOGRAM  01/05/2021    Past Medical History:  Diagnosis Date   Acute on chronic respiratory failure with hypoxia ([facility] 07/05/2019   Anxiety    Blood transfusion    Blood transfusion without reported diagnosis    Chronic mental illness    COPD (chronic obstructive pulmonary disease) ([facility]    COVID-19 virus infection 07/06/2019   DEPRESSION 05/31/2009   Emphysema of lung ([facility]    Esophageal stricture 03/14/2015   History of home oxygen therapy    History of pneumonia    Osteoporosis    Shortness of breath dyspnea    Tobacco abuse    ongoing   Vitamin B 12 deficiency    Vitamin D deficiency     Past Surgical History:  Procedure Laterality Date   ABDOMINAL HYSTERECTOMY     BREAST EXCISIONAL BIOPSY Left 2017   BREAST LUMPECTOMY  1972   left breast   BREAST LUMPECTOMY WITH RADIOACTIVE SEED LOCALIZATION Left 11/22/2015   Procedure: LEFT BREAST LUMPECTOMY WITH RADIOACTIVE SEED  LOCALIZATION;  Surgeon: TErroll Luna MD;  Location: [facility]  Service: General;  Laterality: Left;   BREAST SURGERY  ?2015   lumpectomy, left   CERVICAL CONE BIOPSY     CHOLECYSTECTOMY     COLONOSCOPY     ESOPHAGOGASTRODUODENOSCOPY (EGD) WITH ESOPHAGEAL DILATION     UPPER GASTROINTESTINAL ENDOSCOPY      Family History  Problem Relation Age of Onset   Coronary artery disease Mother    Diabetes Mother    Hypertension Mother    Coronary artery disease Father    Diabetes Father        Grandfather   Hypertension Father    Stroke Father    Other Father        colostomy bag   Breast cancer Sister    Cancer Other        niece--breast   Diabetes Sister    Hypertension Sister    Mental illness Sister        x 4   Esophageal cancer Neg Hx    Stomach cancer Neg Hx    Rectal cancer Neg Hx    Colon cancer Neg Hx    Colon polyps Neg Hx     Social History   Socioeconomic History   Marital status: Divorced    Spouse  name: Not on file   Number of children: 3   Years of education: Not on file   Highest education level: Not on file  Occupational History   Occupation: disabled    Employer: UNEMPLOYED  Tobacco Use   Smoking status: Former    Packs/day: 1.00    Years: 40.00    Pack years: 40.00    Types: Cigarettes    Quit date: 08/12/2011    Years since quitting: 9.5   Smokeless tobacco: Never  Substance and Sexual Activity   Alcohol use: No    Alcohol/week: 0.0 standard drinks   Drug use: No   Sexual activity: Never  Other Topics Concern   Not on file  Social History Narrative   Lost a son to Lung CA 1/18   Lives with a son in [place]   Has a daughter who lives locally   Social Determinants of Health   Financial Resource Strain: Not on file  Food Insecurity: Not on file  Transportation Needs: Not on file  Physical Activity: Not on file  Stress: Not on file  Social Connections: Not on file  Intimate Partner Violence: Not on file    Outpatient Medications  Prior to Visit  Medication Sig Dispense Refill   albuterol (VENTOLIN HFA) 108 (90 Base) MCG/ACT inhaler INHALE TWO PUFFS BY MOUTH INTO THE LUNGS EVERY 6 HOURS AS NEEDED FOR WHEEZING OR SHORTNESS OF BREATH 6.7 g 5   cholecalciferol (VITAMIN D) 1000 UNITS tablet Take 3,000 Units by mouth daily.      LORazepam (ATIVAN) 1 MG tablet Take 1 mg by mouth 2 (two) times daily as needed for anxiety.      OLANZapine (ZYPREXA) 5 MG tablet Take 7.5 mg by mouth daily.      omeprazole (PRILOSEC) 40 MG capsule Take 1 capsule (40 mg total) by mouth daily. 30 capsule 3   OXYGEN Inhale 2 L into the lungs.      Spacer/Aero-Holding Chambers (AEROCHAMBER MV) inhaler Use as instructed 1 each 0   STIOLTO RESPIMAT 2.5-2.5 MCG/ACT AERS INHALE TWO PUFFS INTO THE LUNGS DAILY 4 g 4   vitamin B-12 (CYANOCOBALAMIN) 1000 MCG tablet TAKE 1 TABLET (1,000 MCG TOTAL) BY MOUTH DAILY. 30 tablet 5   No facility-administered medications prior to visit.    Allergies  Allergen Reactions   Sulfonamide Derivatives Hives    ROS See HPI    Objective:    Physical Exam Constitutional:      Appearance: She is well-developed.  Cardiovascular:     Rate and Rhythm: Normal rate and regular rhythm.     Heart sounds: Normal heart sounds. No murmur heard. Pulmonary:     Effort: Pulmonary effort is normal. No respiratory distress.     Breath sounds: No wheezing.     Comments: Diminshed breath sounds throughout Psychiatric:        Behavior: Behavior normal.        Thought Content: Thought content normal.        Judgment: Judgment normal.    BP (!) 145/92 (BP Location: Right Arm, Patient Position: Sitting, Cuff Size: Small)   Pulse 99   Temp 98 F (36.7 C) (Oral)   Resp 16   Wt 149 lb (67.6 kg)   SpO2 98%   BMI 27.25 kg/m  Wt Readings from Last 3 Encounters:  02/22/21 149 lb (67.6 kg)  09/25/20 151 lb 12.8 oz (68.9 kg)  09/14/20 151 lb 9.6 oz (68.8 kg)  Assessment & Plan:   Problem List Items Addressed This  Visit       Unprioritized   Vitamin D deficiency   Relevant Orders   Vitamin D (25 hydroxy)   Schizoaffective disorder ([place])    Stable on current meds. Management per psychiatry.       Osteoporosis   Relevant Medications   Calcium Carbonate-Vitamin D 600-400 MG-UNIT tablet   Other Relevant Orders   DG Bone Density   COPD (chronic obstructive pulmonary disease) (HCC)    Stable, on oxygen 2 L via [place].  Handicapped placard form is filled and provided to patient today. Flu shot today.       B12 deficiency    Continue oral b12 102mg PO daily. Check follow up b12 level.       Relevant Orders   B12   Other Visit Diagnoses     Needs flu shot    -  Primary   Relevant Orders   Flu Vaccine QUAD High Dose(Fluad) (Completed)   Elevated blood pressure reading       Relevant Orders   Comp Met (CMET)       I am having Sydelle R. Scheunemann start on Calcium Carbonate-Vitamin D. I am also having her maintain her cholecalciferol, OXYGEN, OLANZapine, AeroChamber MV, LORazepam, albuterol, vitamin B-12, omeprazole, and Stiolto Respimat.  Meds ordered this encounter  Medications   Calcium Carbonate-Vitamin D 600-400 MG-UNIT tablet    Sig: Take 1 tablet by mouth 2 (two) times daily.    Order Specific Question:   Supervising Provider    Answer:   BPenni HomansA [[[id number]]

## 2021-02-22 NOTE — Assessment & Plan Note (Signed)
Stable on current meds. Management per psychiatry.

## 2021-02-22 NOTE — Assessment & Plan Note (Signed)
Continue oral b12 1085mcg PO daily. Check follow up b12 level.

## 2021-02-22 NOTE — Assessment & Plan Note (Signed)
Stable, on oxygen 2 L via [place].  Handicapped placard form is filled and provided to patient today. Flu shot today.

## 2021-02-24 ENCOUNTER — Telehealth: Payer: Self-pay | Admitting: Family

## 2021-02-24 DIAGNOSIS — R7989 Other specified abnormal findings of blood chemistry: Secondary | ICD-10-CM

## 2021-02-24 NOTE — Telephone Encounter (Signed)
Please contact daughter and let her know that patient's liver testing is mildly elevated. I would like for patient to complete an abdominal ultrasound for further evaluation.

## 2021-02-25 NOTE — Telephone Encounter (Signed)
Called and patient answered she said her daughter gets out of work at 5 pm. She will give her the message to call the office back. Order for test needs to be sign once daughter notified.

## 2021-02-26 ENCOUNTER — Other Ambulatory Visit: Payer: Self-pay | Admitting: Family

## 2021-02-26 NOTE — Telephone Encounter (Signed)
Patient daughter Lovey Newcomer called back. Please return phone call as soon as possible [phone]. Please advise.

## 2021-02-26 NOTE — Telephone Encounter (Signed)
Patient daughter Freida Busman is ok with [place] ordering the ultrasound.  Order has been placed.

## 2021-02-26 NOTE — Telephone Encounter (Signed)
Spoke with patients daughter Freida Busman and she stated that pt never took the omeprazole and she prefers to have [place].  Dexilant refilled.

## 2021-03-13 ENCOUNTER — Telehealth (HOSPITAL_BASED_OUTPATIENT_CLINIC_OR_DEPARTMENT_OTHER): Payer: Self-pay

## 2021-03-14 ENCOUNTER — Ambulatory Visit (HOSPITAL_BASED_OUTPATIENT_CLINIC_OR_DEPARTMENT_OTHER): Payer: Medicare Other

## 2021-03-14 ENCOUNTER — Other Ambulatory Visit (HOSPITAL_BASED_OUTPATIENT_CLINIC_OR_DEPARTMENT_OTHER): Payer: Medicare Other

## 2021-03-15 ENCOUNTER — Telehealth (HOSPITAL_BASED_OUTPATIENT_CLINIC_OR_DEPARTMENT_OTHER): Payer: Self-pay

## 2021-03-21 ENCOUNTER — Ambulatory Visit
Admission: RE | Admit: 2021-03-21 | Discharge: 2021-03-21 | Disposition: A | Discharge status: Home / Self Care | Payer: Medicare Other | Source: Ambulatory Visit | Attending: Family | Admitting: Family

## 2021-03-21 ENCOUNTER — Encounter: Payer: Self-pay | Admitting: Emergency Medicine

## 2021-03-21 ENCOUNTER — Ambulatory Visit (INDEPENDENT_AMBULATORY_CARE_PROVIDER_SITE_OTHER): Payer: Medicare Other | Admitting: Emergency Medicine

## 2021-03-21 ENCOUNTER — Other Ambulatory Visit: Payer: Self-pay

## 2021-03-21 DIAGNOSIS — Z1231 Encounter for screening mammogram for malignant neoplasm of breast: Secondary | ICD-10-CM | POA: Diagnosis not present

## 2021-03-21 DIAGNOSIS — J9611 Chronic respiratory failure with hypoxia: Secondary | ICD-10-CM

## 2021-03-21 DIAGNOSIS — J449 Chronic obstructive pulmonary disease, unspecified: Secondary | ICD-10-CM

## 2021-03-21 MED ORDER — ALBUTEROL SULFATE HFA 108 (90 BASE) MCG/ACT IN AERS: INHALATION_SPRAY | RESPIRATORY_TRACT | 5 refills | Status: DC

## 2021-03-21 MED ORDER — STIOLTO RESPIMAT 2.5-2.5 MCG/ACT IN AERS: INHALATION_SPRAY | RESPIRATORY_TRACT | 4 refills | Status: DC

## 2021-03-21 NOTE — Assessment & Plan Note (Signed)
Continue to use your oxygen at 2 L/min with exertion.

## 2021-03-21 NOTE — Progress Notes (Signed)
Subjective:    Patient ID: SALEM MASTROGIOVANNI, female    DOB: 07-10-1950, 70 y.o.   MRN: [id number]  COPD She complains of shortness of breath. There is no cough or wheezing. Pertinent negatives include no ear pain, fever, headaches, postnasal drip, rhinorrhea, sneezing, sore throat or trouble swallowing. Her past medical history is significant for COPD.     [name] 09/25/20 --Ms. Facemire is a 79, follows up for her severe COPD with associated exertional hypoxemia, allergic rhinitis.  We have been working on trying to get her a wheelchair with oxygen attachment. She has been managed on Stiolto, albuterol which she uses nebs a few times a day. No flares or prednisone. dealing with nasal congestion and drainage.  She has not had COVID since last visit (had it in Feb 2021), is not vaccinated.  Oxygen 2 L/min with exertion, sometimes also at rest.   Order for Standard Wheelchair: Patient suffers from severe COPD and chronic hypoxemia which impairs their ability to perform daily activities like ambulating through the home, preparing meals, self care in the home.  A walker will not resolve  issue with performing activities of daily living. A wheelchair will allow patient to safely perform daily activities. Patient can safely propel the wheelchair in the home or has a caregiver who can provide assistance.  Accessories: elevating leg rests (ELRs), wheel locks, extensions and anti-tippers and a place to attach / carry her oxygen tanks.  [name] 03/21/21 --70 year old woman who follows up today for her severe COPD with associated exertional hypoxemia.  Also with a history of allergic rhinitis.  She is on oxygen at 2 L/min, reports Currently managed on Stiolto, uses albuterol about 0-1 time a day. Had the flu shot recently, no COVID shots. No flares. Minimal cough or sputum. Rare clear mucous.  She reports that her breathing does have some ups and downs - can have dyspnea with heavier exertion like housework. She  doesn't walk far distances - uses her wheelchair. Usually wears her O2        Review of Systems  Constitutional:  Negative for fever and unexpected weight change.  HENT:  Negative for congestion, dental problem, ear pain, nosebleeds, postnasal drip, rhinorrhea, sinus pressure, sneezing, sore throat and trouble swallowing.   Eyes:  Negative for redness and itching.  Respiratory:  Positive for shortness of breath. Negative for cough, chest tightness and wheezing.   Cardiovascular:  Negative for palpitations and leg swelling.  Gastrointestinal:  Negative for nausea and vomiting.  Genitourinary:  Negative for dysuria.  Musculoskeletal:  Negative for joint swelling.  Skin:  Negative for rash.  Neurological:  Negative for headaches.  Hematological:  Does not bruise/bleed easily.  Psychiatric/Behavioral:  Negative for dysphoric mood. The patient is not nervous/anxious.       Objective:   Physical Exam  Vitals:   03/21/21 0907  BP: 118/80  Pulse: 94  SpO2: 98%  Weight: 149 lb (67.6 kg)  Height: 5\' 2"  (1.575 m)   Gen: Pleasant, well-nourished, in no distress  ENT: No lesions,  Edentulous, mouth clear,  oropharynx clear, no postnasal drip  Neck: No JVD, no stridor  Lungs: No use of accessory muscles, distant,   Cardiovascular: RRR, heart sounds normal, no murmur or gallops, no peripheral edema  Musculoskeletal: No deformities, no cyanosis or clubbing  Neuro: alert, non focal, oriented   Skin: Warm, no lesions or rashes       Assessment & Plan:  COPD (chronic obstructive pulmonary disease) (HCC) No  flares since last visit.  Doing well.  Fairly good functional capacity except for long walks.  She does use her wheelchair.  Tolerating Stiolto, plan to continue.  Please continue Stiolto 2 puffs once daily. Keep albuterol available to use 2 puffs up to every 4 hours if needed for shortness of breath, chest tightness, wheezing.  Flu shot up-to-date. Consider getting the  COVID-19 vaccine at some point.  You would probably benefit from this Follow with Dr. Lamonte Sakai in 12 months or sooner if you have any problems  Chronic respiratory failure ([place]) Continue to use your oxygen at 2 L/min with exertion.  Baltazar Apo, MD, PhD 03/21/2021, 9:19 AM [facility] [phone] or if no answer [phone]

## 2021-03-21 NOTE — Addendum Note (Signed)
Addended by: Konrad Felix L on: 03/21/2021 09:21 AM   Modules accepted: Orders

## 2021-03-21 NOTE — Patient Instructions (Signed)
Please continue Stiolto 2 puffs once daily. Keep albuterol available to use 2 puffs up to every 4 hours if needed for shortness of breath, chest tightness, wheezing.  Continue to use your oxygen at 2 L/min with exertion. Flu shot up-to-date. Consider getting the COVID-19 vaccine at some point.  You would probably benefit from this Follow with Dr. Lamonte Sakai in 12 months or sooner if you have any problems.

## 2021-03-21 NOTE — Assessment & Plan Note (Signed)
No flares since last visit.  Doing well.  Fairly good functional capacity except for long walks.  She does use her wheelchair.  Tolerating Stiolto, plan to continue.  Please continue Stiolto 2 puffs once daily. Keep albuterol available to use 2 puffs up to every 4 hours if needed for shortness of breath, chest tightness, wheezing.  Flu shot up-to-date. Consider getting the COVID-19 vaccine at some point.  You would probably benefit from this Follow with Dr. Lamonte Sakai in 12 months or sooner if you have any problems

## 2021-03-26 ENCOUNTER — Other Ambulatory Visit: Payer: Self-pay | Admitting: Family

## 2021-03-26 ENCOUNTER — Telehealth: Payer: Self-pay

## 2021-03-26 ENCOUNTER — Ambulatory Visit (INDEPENDENT_AMBULATORY_CARE_PROVIDER_SITE_OTHER): Payer: Medicare Other

## 2021-03-26 ENCOUNTER — Other Ambulatory Visit: Payer: Self-pay

## 2021-03-26 VITALS — BP 128/86 | HR 81 | Temp 98.1°F | Resp 16 | Ht 62.0 in | Wt 151.4 lb

## 2021-03-26 DIAGNOSIS — Z0000 Encounter for general adult medical examination without abnormal findings: Secondary | ICD-10-CM | POA: Diagnosis not present

## 2021-03-26 DIAGNOSIS — R928 Other abnormal and inconclusive findings on diagnostic imaging of breast: Secondary | ICD-10-CM

## 2021-03-26 NOTE — Patient Instructions (Signed)
Abigail Castillo , Thank you for taking time to come for your Medicare Wellness Visit. I appreciate your ongoing commitment to your health goals. Please review the following plan we discussed and let me know if I can assist you in the future.   Screening recommendations/referrals: Colonoscopy: Due-Declined today. Please call when you are ready to schedule. Mammogram: Completed 03/21/2021. Bone Density: Ordered. Please call to schedule. Recommended yearly ophthalmology/optometry visit for glaucoma screening and checkup Recommended yearly dental visit for hygiene and checkup  Vaccinations: Influenza vaccine: Up to date Pneumococcal vaccine: Up to date Tdap vaccine: Discuss with pharmacy Shingles vaccine: Discuss with pharmacy   Covid-19:Declined  Advanced directives: Declined information today.  Conditions/risks identified: See problem list  Next appointment: Follow up in one year for your annual wellness visit    Preventive Care 65 Years and Older, Female Preventive care refers to lifestyle choices and visits with your health care provider that can promote health and wellness. What does preventive care include? A yearly physical exam. This is also called an annual well check. Dental exams once or twice a year. Routine eye exams. Ask your health care provider how often you should have your eyes checked. Personal lifestyle choices, including: Daily care of your teeth and gums. Regular physical activity. Eating a healthy diet. Avoiding tobacco and drug use. Limiting alcohol use. Practicing safe sex. Taking low-dose aspirin every day. Taking vitamin and mineral supplements as recommended by your health care provider. What happens during an annual well check? The services and screenings done by your health care provider during your annual well check will depend on your age, overall health, lifestyle risk factors, and family history of disease. Counseling  Your health care provider may  ask you questions about your: Alcohol use. Tobacco use. Drug use. Emotional well-being. Home and relationship well-being. Sexual activity. Eating habits. History of falls. Memory and ability to understand (cognition). Work and work Statistician. Reproductive health. Screening  You may have the following tests or measurements: Height, weight, and BMI. Blood pressure. Lipid and cholesterol levels. These may be checked every 5 years, or more frequently if you are over 42 years old. Skin check. Lung cancer screening. You may have this screening every year starting at age 37 if you have a 30-pack-year history of smoking and currently smoke or have quit within the past 15 years. Fecal occult blood test (FOBT) of the stool. You may have this test every year starting at age 74. Flexible sigmoidoscopy or colonoscopy. You may have a sigmoidoscopy every 5 years or a colonoscopy every 10 years starting at age 41. Hepatitis C blood test. Hepatitis B blood test. Sexually transmitted disease (STD) testing. Diabetes screening. This is done by checking your blood sugar (glucose) after you have not eaten for a while (fasting). You may have this done every 1-3 years. Bone density scan. This is done to screen for osteoporosis. You may have this done starting at age 67. Mammogram. This may be done every 1-2 years. Talk to your health care provider about how often you should have regular mammograms. Talk with your health care provider about your test results, treatment options, and if necessary, the need for more tests. Vaccines  Your health care provider may recommend certain vaccines, such as: Influenza vaccine. This is recommended every year. Tetanus, diphtheria, and acellular pertussis (Tdap, Td) vaccine. You may need a Td booster every 10 years. Zoster vaccine. You may need this after age 34. Pneumococcal 13-valent conjugate (PCV13) vaccine. One dose is recommended  after age 21. Pneumococcal  polysaccharide (PPSV23) vaccine. One dose is recommended after age 42. Talk to your health care provider about which screenings and vaccines you need and how often you need them. This information is not intended to replace advice given to you by your health care provider. Make sure you discuss any questions you have with your health care provider. Document Released: 06/08/2015 Document Revised: 01/30/2016 Document Reviewed: 03/13/2015 Elsevier Interactive Patient Education  2017 [place] Prevention in the Home Falls can cause injuries. They can happen to people of all ages. There are many things you can do to make your home safe and to help prevent falls. What can I do on the outside of my home? Regularly fix the edges of walkways and driveways and fix any cracks. Remove anything that might make you trip as you walk through a door, such as a raised step or threshold. Trim any bushes or trees on the path to your home. Use bright outdoor lighting. Clear any walking paths of anything that might make someone trip, such as rocks or tools. Regularly check to see if handrails are loose or broken. Make sure that both sides of any steps have handrails. Any raised decks and porches should have guardrails on the edges. Have any leaves, snow, or ice cleared regularly. Use sand or salt on walking paths during winter. Clean up any spills in your garage right away. This includes oil or grease spills. What can I do in the bathroom? Use night lights. Install grab bars by the toilet and in the tub and shower. Do not use towel bars as grab bars. Use non-skid mats or decals in the tub or shower. If you need to sit down in the shower, use a plastic, non-slip stool. Keep the floor dry. Clean up any water that spills on the floor as soon as it happens. Remove soap buildup in the tub or shower regularly. Attach bath mats securely with double-sided non-slip rug tape. Do not have throw rugs and other  things on the floor that can make you trip. What can I do in the bedroom? Use night lights. Make sure that you have a light by your bed that is easy to reach. Do not use any sheets or blankets that are too big for your bed. They should not hang down onto the floor. Have a firm chair that has side arms. You can use this for support while you get dressed. Do not have throw rugs and other things on the floor that can make you trip. What can I do in the kitchen? Clean up any spills right away. Avoid walking on wet floors. Keep items that you use a lot in easy-to-reach places. If you need to reach something above you, use a strong step stool that has a grab bar. Keep electrical cords out of the way. Do not use floor polish or wax that makes floors slippery. If you must use wax, use non-skid floor wax. Do not have throw rugs and other things on the floor that can make you trip. What can I do with my stairs? Do not leave any items on the stairs. Make sure that there are handrails on both sides of the stairs and use them. Fix handrails that are broken or loose. Make sure that handrails are as long as the stairways. Check any carpeting to make sure that it is firmly attached to the stairs. Fix any carpet that is loose or worn. Avoid having throw  rugs at the top or bottom of the stairs. If you do have throw rugs, attach them to the floor with carpet tape. Make sure that you have a light switch at the top of the stairs and the bottom of the stairs. If you do not have them, ask someone to add them for you. What else can I do to help prevent falls? Wear shoes that: Do not have high heels. Have rubber bottoms. Are comfortable and fit you well. Are closed at the toe. Do not wear sandals. If you use a stepladder: Make sure that it is fully opened. Do not climb a closed stepladder. Make sure that both sides of the stepladder are locked into place. Ask someone to hold it for you, if possible. Clearly  mark and make sure that you can see: Any grab bars or handrails. First and last steps. Where the edge of each step is. Use tools that help you move around (mobility aids) if they are needed. These include: Canes. Walkers. Scooters. Crutches. Turn on the lights when you go into a dark area. Replace any light bulbs as soon as they burn out. Set up your furniture so you have a clear path. Avoid moving your furniture around. If any of your floors are uneven, fix them. If there are any pets around you, be aware of where they are. Review your medicines with your doctor. Some medicines can make you feel dizzy. This can increase your chance of falling. Ask your doctor what other things that you can do to help prevent falls. This information is not intended to replace advice given to you by your health care provider. Make sure you discuss any questions you have with your health care provider. Document Released: 03/08/2009 Document Revised: 10/18/2015 Document Reviewed: 06/16/2014 Elsevier Interactive Patient Education  2017 [employer].

## 2021-03-26 NOTE — Progress Notes (Signed)
Subjective:   Abigail Castillo is a 70 y.o. female who presents for Medicare Annual (Subsequent) preventive examination.   Review of Systems     Cardiac Risk Factors include: advanced age (>14men, >17 women);dyslipidemia;sedentary lifestyle     Objective:    Today's Vitals   03/26/21 0809  BP: 128/86  Pulse: 81  Resp: 16  Temp: 98.1 F (36.7 C)  TempSrc: Temporal  SpO2: 99%  Weight: 151 lb 6.4 oz (68.7 kg)  Height: 5\' 2"  (1.575 m)   Body mass index is 27.69 kg/m.  Advanced Directives 03/26/2021 01/31/2020 07/05/2019 01/25/2019 01/22/2018 12/11/2016 11/22/2015  Does Patient Have a Medical Advance Directive? No No No No No No No  Would patient like information on creating a medical advance directive? No - Patient declined No - Patient declined No - Patient declined No - Patient declined Yes (MAU/Ambulatory/Procedural Areas - Information given) Yes (MAU/Ambulatory/Procedural Areas - Information given) No - patient declined information    Current Medications (verified) Outpatient Encounter Medications as of 03/26/2021  Medication Sig   albuterol (VENTOLIN HFA) 108 (90 Base) MCG/ACT inhaler INHALE TWO PUFFS BY MOUTH INTO THE LUNGS EVERY 6 HOURS AS NEEDED FOR WHEEZING OR SHORTNESS OF BREATH   Calcium Carbonate-Vitamin D 600-400 MG-UNIT tablet Take 1 tablet by mouth 2 (two) times daily.   cholecalciferol (VITAMIN D) 1000 UNITS tablet Take 3,000 Units by mouth daily.    DEXILANT 60 MG capsule TAKE ONE CAPSULE BY MOUTH DAILY   LORazepam (ATIVAN) 1 MG tablet Take 1 mg by mouth 2 (two) times daily as needed for anxiety.    OLANZapine (ZYPREXA) 5 MG tablet Take 7.5 mg by mouth daily.    OXYGEN Inhale 2 L into the lungs.    Spacer/Aero-Holding Chambers (AEROCHAMBER MV) inhaler Use as instructed   Tiotropium Bromide-Olodaterol (STIOLTO RESPIMAT) 2.5-2.5 MCG/ACT AERS INHALE TWO PUFFS INTO THE LUNGS DAILY   vitamin B-12 (CYANOCOBALAMIN) 1000 MCG tablet TAKE ONE TABLET BY MOUTH DAILY   No  facility-administered encounter medications on file as of 03/26/2021.    Allergies (verified) Sulfonamide derivatives   History: Past Medical History:  Diagnosis Date   Acute on chronic respiratory failure with hypoxia ([place]) 07/05/2019   Anxiety    Blood transfusion    Blood transfusion without reported diagnosis    Chronic mental illness    COPD (chronic obstructive pulmonary disease) ([facility])    COVID-19 virus infection 07/06/2019   DEPRESSION 05/31/2009   Emphysema of lung ([place])    Esophageal stricture 03/14/2015   History of home oxygen therapy    History of pneumonia    Osteoporosis    Shortness of breath dyspnea    Tobacco abuse    ongoing   Vitamin B 12 deficiency    Vitamin D deficiency    Past Surgical History:  Procedure Laterality Date   ABDOMINAL HYSTERECTOMY     BREAST EXCISIONAL BIOPSY Left 2017   BREAST LUMPECTOMY  1972   left breast   BREAST LUMPECTOMY WITH RADIOACTIVE SEED LOCALIZATION Left 11/22/2015   Procedure: LEFT BREAST LUMPECTOMY WITH RADIOACTIVE SEED LOCALIZATION;  Surgeon: Erroll Luna, MD;  Location: [facility];  Service: General;  Laterality: Left;   BREAST SURGERY  ?2015   lumpectomy, left   CERVICAL CONE BIOPSY     CHOLECYSTECTOMY     COLONOSCOPY     ESOPHAGOGASTRODUODENOSCOPY (EGD) WITH ESOPHAGEAL DILATION     UPPER GASTROINTESTINAL ENDOSCOPY     Family History  Problem Relation Age of Onset   Coronary  artery disease Mother    Diabetes Mother    Hypertension Mother    Coronary artery disease Father    Diabetes Father        Grandfather   Hypertension Father    Stroke Father    Other Father        colostomy bag   Breast cancer Sister    Cancer Other        niece--breast   Diabetes Sister    Hypertension Sister    Mental illness Sister        x 4   Esophageal cancer Neg Hx    Stomach cancer Neg Hx    Rectal cancer Neg Hx    Colon cancer Neg Hx    Colon polyps Neg Hx    Social History   Socioeconomic History   Marital status:  Divorced    Spouse name: Not on file   Number of children: 3   Years of education: Not on file   Highest education level: Not on file  Occupational History   Occupation: disabled    Employer: UNEMPLOYED  Tobacco Use   Smoking status: Former    Packs/day: 1.00    Years: 40.00    Pack years: 40.00    Types: Cigarettes    Quit date: 08/12/2011    Years since quitting: 9.6   Smokeless tobacco: Never  Substance and Sexual Activity   Alcohol use: No    Alcohol/week: 0.0 standard drinks   Drug use: No   Sexual activity: Never  Other Topics Concern   Not on file  Social History Narrative   Lost a son to Lung CA 1/18   Lives with a son in [place]   Has a daughter who lives locally   Social Determinants of Health   Financial Resource Strain: Low Risk    Difficulty of Paying Living Expenses: Not hard at all  Food Insecurity: No Food Insecurity   Worried About Charity fundraiser in the Last Year: Never true   Arboriculturist in the Last Year: Never true  Transportation Needs: No Transportation Needs   Lack of Transportation (Medical): No   Lack of Transportation (Non-Medical): No  Physical Activity: Inactive   Days of Exercise per Week: 0 days   Minutes of Exercise per Session: 0 min  Stress: No Stress Concern Present   Feeling of Stress : Not at all  Social Connections: Moderately Isolated   Frequency of Communication with Friends and Family: More than three times a week   Frequency of Social Gatherings with Friends and Family: More than three times a week   Attends Religious Services: More than 4 times per year   Active Member of [employer] or Organizations: No   Attends Music therapist: Never   Marital Status: Divorced    Tobacco Counseling Counseling given: Not Answered   Clinical Intake:  Pre-visit preparation completed: Yes  Pain : No/denies pain     BMI - recorded: 27.69 Nutritional Status: BMI 25 -29 Overweight Nutritional Risks:  None Diabetes: No  How often do you need to have someone help you when you read instructions, pamphlets, or other written materials from your doctor or pharmacy?: 1 - Never  Diabetic?No  Interpreter Needed?: No  Information entered by :: Caroleen Hamman LPN   Activities of Daily Living In your present state of health, do you have any difficulty performing the following activities: 03/26/2021  Hearing? N  Vision? N  Difficulty  concentrating or making decisions? N  Walking or climbing stairs? N  Dressing or bathing? N  Doing errands, shopping? Y  Preparing Food and eating ? N  Using the Toilet? N  In the past six months, have you accidently leaked urine? N  Do you have problems with loss of bowel control? Y  Comment occasionally with diarrhea  Managing your Medications? N  Managing your Finances? N  Housekeeping or managing your Housekeeping? N  Some recent data might be hidden    Patient Care Team: [name], NP as PCP - General (Internal Medicine) Gatha Mayer, MD as Consulting Physician (Gastroenterology) Lendon Colonel, MD as Referring Physician (Psychiatry)  Indicate any recent Medical Services you may have received from other than [facility] providers in the past year (date may be approximate).     Assessment:   This is a routine wellness examination for [place].  Hearing/Vision screen Hearing Screening - Comments:: No issues Vision Screening - Comments:: Reading glasses Last eye exam- years ago  Dietary issues and exercise activities discussed: Current Exercise Habits: The patient does not participate in regular exercise at present, Exercise limited by: respiratory conditions(s)   Goals Addressed             This Visit's Progress    Patient Stated       Drink more water       Depression Screen PHQ 2/9 Scores 03/26/2021 08/21/2020 01/31/2020 01/25/2019 01/22/2018 11/11/2017 12/11/2016  PHQ - 2 Score 0 3 0 0 0 2 0  PHQ- 9 Score - 5 - - - 8 -    Fall  Risk Fall Risk  03/26/2021 01/31/2020 01/25/2019 01/22/2018 12/11/2016  Falls in the past year? 0 0 0 No No  Number falls in past yr: 0 0 - - -  Injury with Fall? 0 0 - - -  Follow up Falls prevention discussed Education provided;Falls prevention discussed - - -    FALL RISK PREVENTION PERTAINING TO THE HOME:  Any stairs in or around the home? Yes  If so, are there any without handrails? No  Home free of loose throw rugs in walkways, pet beds, electrical cords, etc? Yes  Adequate lighting in your home to reduce risk of falls? Yes   ASSISTIVE DEVICES UTILIZED TO PREVENT FALLS:  Life alert? No  Use of a cane, walker or w/c? Yes cane occasionally Grab bars in the bathroom? No  Shower chair or bench in shower? Yes  Elevated toilet seat or a handicapped toilet? No   TIMED UP AND GO:  Was the test performed? Yes .  Length of time to ambulate 10 feet: 11 sec.   Gait slow and steady without use of assistive device  Cognitive Function: MMSE - Mini Mental State Exam 01/31/2020 01/22/2018 12/11/2016  Not completed: Unable to complete - -  Orientation to time - 4 5  Orientation to Place - 5 5  Registration - 3 3  Attention/ Calculation - 5 5  Recall - 2 2  Language- name 2 objects - 2 2  Language- repeat - 1 1  Language- follow 3 step command - 3 3  Language- read & follow direction - 1 1  Write a sentence - 1 1  Copy design - 1 1  Total score - 28 29     6CIT Screen 03/26/2021 01/25/2019  What Year? 0 points 0 points  What month? 0 points 0 points  What time? 0 points 0 points  Count back from 20  0 points 0 points  Months in reverse 0 points 4 points  Repeat phrase 10 points 4 points  Total Score 10 8    Immunizations Immunization History  Administered Date(s) Administered   Fluad Quad(high Dose 65+) 04/16/2020, 02/22/2021   Influenza Whole 03/02/2008, 04/09/2010   Influenza, High Dose Seasonal PF 03/13/2017   Influenza, Seasonal, Injecte, Preservative Fre 06/18/2012    Influenza,inj,Quad PF,6+ Mos 02/01/2013, 02/03/2014, 02/05/2015, 03/10/2016   Influenza,inj,Quad PF,6-35 Mos 01/25/2019   Influenza-Unspecified 02/23/2018   PPD Test 12/17/2010   Pneumococcal Conjugate-13 11/11/2017   Pneumococcal Polysaccharide-23 03/02/2008, 02/01/2013, 08/21/2020   Tdap 05/26/2008    TDAP status: Due, Education has been provided regarding the importance of this vaccine. Advised may receive this vaccine at local pharmacy or Health Dept. Aware to provide a copy of the vaccination record if obtained from local pharmacy or Health Dept. Verbalized acceptance and understanding.  Flu Vaccine status: Up to date  Pneumococcal vaccine status: Up to date  Covid-19 vaccine status: Declined, Education has been provided regarding the importance of this vaccine but patient still declined. Advised may receive this vaccine at local pharmacy or Health Dept.or vaccine clinic. Aware to provide a copy of the vaccination record if obtained from local pharmacy or Health Dept. Verbalized acceptance and understanding.  Qualifies for Shingles Vaccine? Yes   Zostavax completed No   Shingrix Completed?: No.    Education has been provided regarding the importance of this vaccine. Patient has been advised to call insurance company to determine out of pocket expense if they have not yet received this vaccine. Advised may also receive vaccine at local pharmacy or Health Dept. Verbalized acceptance and understanding.  Screening Tests Health Maintenance  Topic Date Due   Zoster Vaccines- Shingrix (1 of 2) Never done   TETANUS/TDAP  05/26/2018   COLONOSCOPY (Pts 45-73yrs Insurance coverage will need to be confirmed)  08/21/2021 (Originally 02/11/2017)   MAMMOGRAM  03/21/2022   Pneumonia Vaccine 79+ Years old  Completed   INFLUENZA VACCINE  Completed   DEXA SCAN  Completed   HPV VACCINES  Aged Out   COVID-19 Vaccine  Discontinued   Hepatitis C Screening  Discontinued    Health  Maintenance  Health Maintenance Due  Topic Date Due   Zoster Vaccines- Shingrix (1 of 2) Never done   TETANUS/TDAP  05/26/2018    Colorectal cancer screening: Due-Patient declined today.  Mammogram status: Completed bilateral 03/21/2021. Repeat every year. Report states-Further evaluation is suggested for calcifications in the left breast.  Bone Density status: Ordered previously. Pt provided with contact info and advised to call to schedule appt.  Lung Cancer Screening: (Low Dose CT Chest recommended if Age 61-80 years, 30 pack-year currently smoking OR have quit w/in 15years.) does qualify.   Lung Cancer Screening Referral: Declined today  Additional Screening:  Hepatitis C Screening: does qualify; Per chart-patient declines  Vision Screening: Recommended annual ophthalmology exams for early detection of glaucoma and other disorders of the eye. Is the patient up to date with their annual eye exam?  No  Who is the provider or what is the name of the office in which the patient attends annual eye exams? Pt unsure of name Patient advised to make an appt  Dental Screening: Recommended annual dental exams for proper oral hygiene  Community Resource Referral / Chronic Care Management: CRR required this visit?  Yes for transportation assistance  CCM required this visit?  No      Plan:     I  have personally reviewed and noted the following in the patient's chart:   Medical and social history Use of alcohol, tobacco or illicit drugs  Current medications and supplements including opioid prescriptions.  Functional ability and status Nutritional status Physical activity Advanced directives List of other physicians Hospitalizations, surgeries, and ER visits in previous 12 months Vitals Screenings to include cognitive, depression, and falls Referrals and appointments  In addition, I have reviewed and discussed with patient certain preventive protocols, quality metrics, and  best practice recommendations. A written personalized care plan for preventive services as well as general preventive health recommendations were provided to patient.     Marta Antu, LPN   [id number]  Nurse Health Advisor  Nurse Notes: None

## 2021-03-26 NOTE — Telephone Encounter (Signed)
Abigail Castillo, During medicare wellness exam. Patient wanted to know her recent mammogram results.I saw where someone tried to notify her yesterday so I gave her the results. She agrees to have the recommended further testing. I told her someone will call her to schedule. I did not see the orders in the chart.

## 2021-03-26 NOTE — Telephone Encounter (Signed)
Noted. [facility] to her to schedule. Hector Shade.

## 2021-04-04 ENCOUNTER — Telehealth: Payer: Self-pay

## 2021-04-04 NOTE — Telephone Encounter (Signed)
   Telephone encounter was:  Unsuccessful.  04/04/2021 Name: Abigail Castillo MRN: [id number] DOB: 17-Oct-1950  Unsuccessful outbound call made today to assist with:  Transportation Needs   Outreach Attempt:  1st Attempt  A HIPAA compliant voice message was left requesting a return call.  Instructed patient to call back at [phone] at their earliest convenience.  [facility] management  [place], [place]  Main Phone: [phone]  E-mail: [email or web address]  Website: [email or web address]

## 2021-04-05 ENCOUNTER — Telehealth: Payer: Self-pay

## 2021-04-05 NOTE — Telephone Encounter (Signed)
   Telephone encounter was:  Unsuccessful.  04/05/2021 Name: Abigail Castillo MRN: [id number] DOB: [birth year]  Unsuccessful outbound call made today to assist with:  Transportation Needs   Outreach Attempt:  2nd Attempt  A HIPAA compliant voice message was left requesting a return call.  Instructed patient to call back at [phone] at their earliest convenience.  [facility] management  [place], [place]  Main Phone: [phone]  [email or web address]: [email or web address]  Website: [email or web address]

## 2021-04-09 ENCOUNTER — Telehealth: Payer: Self-pay

## 2021-04-09 NOTE — Telephone Encounter (Signed)
   Telephone encounter was:  Unsuccessful.  04/09/2021 Name: Abigail Castillo MRN: [id number] DOB: 15-Jul-1950  Unsuccessful outbound call made today to assist with:  Transportation Needs   Outreach Attempt:  3rd Attempt.  Referral closed unable to contact patient.  A HIPAA compliant voice message was left requesting a return call.  Instructed patient to call back at [phone] at their earliest convenience.  Left voicemail for pt to return my call. I have also reached out to daughter and left a message on daughter's cell in order for pt to return my call.   [name]  [place], [address] [place]  Main Phone: [phone]  E-mail: [email or web address]  Website: [email or web address]

## 2021-04-11 ENCOUNTER — Other Ambulatory Visit (HOSPITAL_BASED_OUTPATIENT_CLINIC_OR_DEPARTMENT_OTHER): Payer: Self-pay | Admitting: Family

## 2021-04-11 DIAGNOSIS — M810 Age-related osteoporosis without current pathological fracture: Secondary | ICD-10-CM

## 2021-04-15 ENCOUNTER — Ambulatory Visit (HOSPITAL_BASED_OUTPATIENT_CLINIC_OR_DEPARTMENT_OTHER): Payer: Medicare Other

## 2021-04-15 ENCOUNTER — Ambulatory Visit (HOSPITAL_BASED_OUTPATIENT_CLINIC_OR_DEPARTMENT_OTHER)
Admission: RE | Admit: 2021-04-15 | Discharge: 2021-04-15 | Disposition: A | Discharge status: Home / Self Care | Payer: Medicare Other | Source: Ambulatory Visit | Attending: Family | Admitting: Family

## 2021-04-15 ENCOUNTER — Other Ambulatory Visit: Payer: Self-pay

## 2021-04-15 DIAGNOSIS — M810 Age-related osteoporosis without current pathological fracture: Secondary | ICD-10-CM | POA: Insufficient documentation

## 2021-04-15 DIAGNOSIS — Z780 Asymptomatic menopausal state: Secondary | ICD-10-CM | POA: Diagnosis not present

## 2021-04-16 ENCOUNTER — Telehealth: Payer: Self-pay | Admitting: Family

## 2021-04-16 NOTE — Telephone Encounter (Signed)
Bone density is still showing significant bone thinning. Would she be willing to start prolia injections every 6 months for bone health? If so, please forward her information to [place] who is completing insurance authorizations for prolia.

## 2021-04-22 NOTE — Telephone Encounter (Signed)
Just making sure you saw this message. tks

## 2021-04-22 NOTE — Telephone Encounter (Signed)
FYI Results and provider's advise given to patient.  Patient wants to "think about it and call us back when she makes a decision".

## 2021-04-24 ENCOUNTER — Other Ambulatory Visit: Payer: Self-pay | Admitting: Family

## 2021-04-24 ENCOUNTER — Ambulatory Visit
Admission: RE | Admit: 2021-04-24 | Discharge: 2021-04-24 | Disposition: A | Discharge status: Home / Self Care | Payer: Medicare Other | Source: Ambulatory Visit | Attending: Family | Admitting: Family

## 2021-04-24 DIAGNOSIS — R928 Other abnormal and inconclusive findings on diagnostic imaging of breast: Secondary | ICD-10-CM

## 2021-04-24 DIAGNOSIS — R921 Mammographic calcification found on diagnostic imaging of breast: Secondary | ICD-10-CM

## 2021-04-24 DIAGNOSIS — R922 Inconclusive mammogram: Secondary | ICD-10-CM | POA: Diagnosis not present

## 2021-05-03 ENCOUNTER — Ambulatory Visit
Admission: RE | Admit: 2021-05-03 | Discharge: 2021-05-03 | Disposition: A | Discharge status: Home / Self Care | Payer: Medicare Other | Source: Ambulatory Visit | Attending: Family | Admitting: Family

## 2021-05-03 ENCOUNTER — Telehealth: Payer: Self-pay

## 2021-05-03 ENCOUNTER — Other Ambulatory Visit (HOSPITAL_COMMUNITY): Payer: Self-pay

## 2021-05-03 DIAGNOSIS — D0512 Intraductal carcinoma in situ of left breast: Secondary | ICD-10-CM | POA: Diagnosis not present

## 2021-05-03 DIAGNOSIS — R921 Mammographic calcification found on diagnostic imaging of breast: Secondary | ICD-10-CM

## 2021-05-03 DIAGNOSIS — R928 Other abnormal and inconclusive findings on diagnostic imaging of breast: Secondary | ICD-10-CM

## 2021-05-03 NOTE — Telephone Encounter (Signed)
Patient Advocate Encounter  Prior Authorization for [employer] Respimat has been approved.    PA# N/A  Effective dates: 05/26/2020 through 05/03/2022  Per Test Claim Patients co-pay is $0.   Spoke with Pharmacy to Process.  Patient Advocate Fax:  [phone]

## 2021-05-03 NOTE — Telephone Encounter (Signed)
Patient Advocate Encounter   Received notification from [facility] that prior authorization for Stiolto Respimat 2.74mcg is required by his/her insurance [employer].  PA submitted on 05/03/2021  Key#:  [id number]  Status is pending    [facility] will continue to follow:  Patient Advocate Fax:  [phone]

## 2021-05-10 ENCOUNTER — Ambulatory Visit (HOSPITAL_BASED_OUTPATIENT_CLINIC_OR_DEPARTMENT_OTHER)
Admission: RE | Admit: 2021-05-10 | Discharge: 2021-05-10 | Disposition: A | Discharge status: Home / Self Care | Payer: Medicare Other | Source: Ambulatory Visit | Attending: Family | Admitting: Family

## 2021-05-10 ENCOUNTER — Encounter: Payer: Self-pay | Admitting: Emergency Medicine

## 2021-05-10 ENCOUNTER — Other Ambulatory Visit: Payer: Self-pay

## 2021-05-10 ENCOUNTER — Ambulatory Visit (INDEPENDENT_AMBULATORY_CARE_PROVIDER_SITE_OTHER): Payer: Medicare Other | Admitting: Emergency Medicine

## 2021-05-10 DIAGNOSIS — J9611 Chronic respiratory failure with hypoxia: Secondary | ICD-10-CM

## 2021-05-10 DIAGNOSIS — Z9049 Acquired absence of other specified parts of digestive tract: Secondary | ICD-10-CM | POA: Diagnosis not present

## 2021-05-10 DIAGNOSIS — R7989 Other specified abnormal findings of blood chemistry: Secondary | ICD-10-CM | POA: Diagnosis not present

## 2021-05-10 DIAGNOSIS — J309 Allergic rhinitis, unspecified: Secondary | ICD-10-CM | POA: Diagnosis not present

## 2021-05-10 DIAGNOSIS — J449 Chronic obstructive pulmonary disease, unspecified: Secondary | ICD-10-CM | POA: Diagnosis not present

## 2021-05-10 DIAGNOSIS — K760 Fatty (change of) liver, not elsewhere classified: Secondary | ICD-10-CM | POA: Diagnosis not present

## 2021-05-10 NOTE — Progress Notes (Addendum)
Subjective:    Patient ID: Abigail Castillo, female    DOB: [birth year], 70 y.o.   MRN: [id number]  COPD She complains of shortness of breath. There is no cough or wheezing. Pertinent negatives include no ear pain, fever, headaches, postnasal drip, rhinorrhea, sneezing, sore throat or trouble swallowing. Her past medical history is significant for COPD.   [name] 03/21/21 --70 year old woman who follows up today for her severe COPD with associated exertional hypoxemia.  Also with a history of allergic rhinitis.  She is on oxygen at 2 L/min, reports Currently managed on Stiolto, uses albuterol about 0-1 time a day. Had the flu shot recently, no COVID shots. No flares. Minimal cough or sputum. Rare clear mucous.  She reports that her breathing does have some ups and downs - can have dyspnea with heavier exertion like housework. She doesn't walk far distances - uses her wheelchair. Usually wears her O2   [name] 05/10/21 --Abigail Castillo has chronic hypoxemic respiratory failure in the setting of severe COPD.  Also with a history of allergic rhinitis.  She has been managed on Stiolto. She has been more SOB today, has been more active today which has led to dyspnea. No flares, no pred or abx. She had some increased nasal congestion and sinus drainage for the last few day.  Uses oxygen at 2 L/min, apparently needed to have a repeat walking oximetry done per her DME.     Review of Systems  Constitutional:  Negative for fever and unexpected weight change.  HENT:  Negative for congestion, dental problem, ear pain, nosebleeds, postnasal drip, rhinorrhea, sinus pressure, sneezing, sore throat and trouble swallowing.   Eyes:  Negative for redness and itching.  Respiratory:  Positive for shortness of breath. Negative for cough, chest tightness and wheezing.   Cardiovascular:  Negative for palpitations and leg swelling.  Gastrointestinal:  Negative for nausea and vomiting.  Genitourinary:  Negative for dysuria.   Musculoskeletal:  Negative for joint swelling.  Skin:  Negative for rash.  Neurological:  Negative for headaches.  Hematological:  Does not bruise/bleed easily.  Psychiatric/Behavioral:  Negative for dysphoric mood. The patient is not nervous/anxious.       Objective:   Physical Exam  Vitals:   05/10/21 1515  BP: 118/66  Pulse: 100  Temp: 98.3 F (36.8 C)  TempSrc: Oral  SpO2: 94%  Weight: 150 lb 9.6 oz (68.3 kg)  Height: 5\' 2"  (1.575 m)   Gen: Pleasant, well-nourished, in no distress  ENT: No lesions,  Edentulous, mouth clear,  oropharynx clear, no postnasal drip  Neck: No JVD, no stridor  Lungs: No use of accessory muscles, distant,   Cardiovascular: RRR, heart sounds normal, no murmur or gallops, no peripheral edema  Musculoskeletal: No deformities, no cyanosis or clubbing  Neuro: alert, non focal, oriented   Skin: Warm, no lesions or rashes       Assessment & Plan:  COPD (chronic obstructive pulmonary disease) (HCC) Continue Stiolto 2 puffs once daily. Keep your albuterol available to use 2 puffs when you needed for shortness of breath, chest tightness, wheezing. Follow with Dr Lamonte Sakai in 6 months or sooner if you have any problems  Chronic respiratory failure ([place]) We will repeat your walking oximetry today to ensure that you are qualified for oxygen.  Continue 2 L/min   Allergic rhinitis Try starting loratadine 10 mg once daily when your allergy symptoms are active. Try using fluticasone nasal spray (Flonase), 2 sprays each nostril once daily if you  need it for allergy symptoms, congestion and drainage.  Baltazar Apo, MD, PhD 05/10/2021, 3:36 PM [facility] [phone] or if no answer [phone]   Addendum: Reviewed patient's clinical status, chart.  She is preparing for possible breast lumpectomy.  Her underlying COPD, hypoxemic respiratory failure place her at moderate to high surgical risk, anesthesia risk.  This includes the  risk for extended period of mechanical ventilation, prolonged hospitalization.  As long as she is not having symptoms consistent with an acute exacerbation of her COPD then there are no absolute contraindications to proceeding with surgery as long as the benefits outweigh these risks.  Baltazar Apo, MD, PhD 05/16/2021, 4:44 PM [facility] [phone] or if no answer before 7:00PM call [phone] For any issues after 7:00PM please call eLink [phone]

## 2021-05-10 NOTE — Assessment & Plan Note (Signed)
We will repeat your walking oximetry today to ensure that you are qualified for oxygen.  Continue 2 L/min

## 2021-05-10 NOTE — Assessment & Plan Note (Signed)
Try starting loratadine 10 mg once daily when your allergy symptoms are active. Try using fluticasone nasal spray (Flonase), 2 sprays each nostril once daily if you need it for allergy symptoms, congestion and drainage.

## 2021-05-10 NOTE — Patient Instructions (Addendum)
We will repeat your walking oximetry today to ensure that you are qualified for oxygen.  Continue 2 L/min Continue Stiolto 2 puffs once daily. Keep your albuterol available to use 2 puffs when you needed for shortness of breath, chest tightness, wheezing. Try starting loratadine 10 mg once daily when your allergy symptoms are active. Try using fluticasone nasal spray (Flonase), 2 sprays each nostril once daily if you need it for allergy symptoms, congestion and drainage. Follow with Dr Lamonte Sakai in 6 months or sooner if you have any problems

## 2021-05-10 NOTE — Assessment & Plan Note (Signed)
Continue Stiolto 2 puffs once daily. Keep your albuterol available to use 2 puffs when you needed for shortness of breath, chest tightness, wheezing. Follow with Dr Lamonte Sakai in 6 months or sooner if you have any problems

## 2021-05-13 ENCOUNTER — Encounter: Payer: Self-pay | Admitting: Family

## 2021-05-13 ENCOUNTER — Ambulatory Visit: Payer: Self-pay | Admitting: Surgery

## 2021-05-13 DIAGNOSIS — C50919 Malignant neoplasm of unspecified site of unspecified female breast: Secondary | ICD-10-CM

## 2021-05-13 DIAGNOSIS — K760 Fatty (change of) liver, not elsewhere classified: Secondary | ICD-10-CM | POA: Insufficient documentation

## 2021-05-13 DIAGNOSIS — D0512 Intraductal carcinoma in situ of left breast: Secondary | ICD-10-CM

## 2021-05-13 HISTORY — DX: Malignant neoplasm of unspecified site of unspecified female breast: C50.919

## 2021-05-13 NOTE — Progress Notes (Signed)
Mailed out to patient 

## 2021-05-15 ENCOUNTER — Telehealth: Payer: Self-pay | Admitting: Emergency Medicine

## 2021-05-15 NOTE — Telephone Encounter (Signed)
Fax received from Dr. Erroll Luna to perform brest lumpectomy surgery on patient.  Patient needs surgery clearance. Patient was seen on 05/10/2021. Office protocol is a risk assessment can be sent to surgeon if patient has been seen in 60 days or less.   Sending to Dr. Lamonte Sakai to addend his note for risk assessment

## 2021-05-16 NOTE — Telephone Encounter (Signed)
Routing back to [place] as addendum was done by RB.

## 2021-05-16 NOTE — Telephone Encounter (Signed)
I did need that note.  Please make this available for Dr. Brantley Stage.  Thank you

## 2021-05-17 NOTE — Telephone Encounter (Signed)
OV notes and clearance form has been faxed back to [facility]. Nothing further needed at this time.

## 2021-05-23 ENCOUNTER — Telehealth: Payer: Self-pay | Admitting: Hematology and Oncology

## 2021-05-23 NOTE — Telephone Encounter (Signed)
Scheduled appt per 12/22 referral. Spoke to pt's daughter who brings pt to her appts. She is aware of appt date and time. I did offer her an appt on 12/12, however they needed a morning slot. I scheduled pt for first available morning slot. Pt's daughter is also aware to have pt here 15 mins prior to appt time.

## 2021-05-29 ENCOUNTER — Ambulatory Visit: Payer: Medicare Other

## 2021-05-29 ENCOUNTER — Ambulatory Visit: Payer: Medicare Other | Admitting: Radiation Oncology

## 2021-06-03 DIAGNOSIS — D0512 Intraductal carcinoma in situ of left breast: Secondary | ICD-10-CM | POA: Insufficient documentation

## 2021-06-03 NOTE — Progress Notes (Incomplete)
Radiation Oncology         [phone] ________________________________  Name: Abigail Castillo        MRN: [id number]  Date of Service: 06/05/2021 DOB: [birth year]  CC:[name], [name], NP  Erroll Luna, MD     REFERRING PHYSICIAN: Erroll Luna, MD   DIAGNOSIS: The encounter diagnosis was Ductal carcinoma in situ (DCIS) of left breast.   HISTORY OF PRESENT ILLNESS: Abigail Castillo is a 71 y.o. female seen at the request of Dr. Brantley Stage for a new diagnosis of left breast cancer. The patient was noted to have a CSL of the breast in 2017, and recent screening mammogram showed a 5 cm group of calcifications in the upper outer quadrant. There was a second group of calcifications but the main site was biopsied anteriorly and posteriorly and was consistent with high grade, ER/PR positive DCIS with calcifications and necrosis. She was offered lumpectomy versus mastectomy due to risks of breast volume loss but is motivated for breast conservation and getting set up for this. She's seen to discuss adjuvant radiotherapy and sees Dr. Chryl Heck next week.     PREVIOUS RADIATION THERAPY: {EXAM; YES/NO:19492::"No"}   PAST MEDICAL HISTORY:  Past Medical History:  Diagnosis Date   Acute on chronic respiratory failure with hypoxia (HCC) 07/05/2019   Anxiety    Blood transfusion    Blood transfusion without reported diagnosis    Chronic mental illness    COPD (chronic obstructive pulmonary disease) ([place])    COVID-19 virus infection 07/06/2019   DEPRESSION 05/31/2009   Emphysema of lung (HCC)    Esophageal stricture 03/14/2015   Hepatic steatosis    History of home oxygen therapy    History of pneumonia    Osteoporosis    Shortness of breath dyspnea    Tobacco abuse    ongoing   Vitamin B 12 deficiency    Vitamin D deficiency        PAST SURGICAL HISTORY: Past Surgical History:  Procedure Laterality Date   ABDOMINAL HYSTERECTOMY     BREAST EXCISIONAL BIOPSY Left 2017   BREAST  LUMPECTOMY  1972   left breast   BREAST LUMPECTOMY WITH RADIOACTIVE SEED LOCALIZATION Left 11/22/2015   Procedure: LEFT BREAST LUMPECTOMY WITH RADIOACTIVE SEED LOCALIZATION;  Surgeon: Erroll Luna, MD;  Location: [place] [facility];  Service: General;  Laterality: Left;   BREAST SURGERY  ?2015   lumpectomy, left   CERVICAL CONE BIOPSY     CHOLECYSTECTOMY     COLONOSCOPY     ESOPHAGOGASTRODUODENOSCOPY (EGD) WITH ESOPHAGEAL DILATION     UPPER GASTROINTESTINAL ENDOSCOPY       FAMILY HISTORY:  Family History  Problem Relation Age of Onset   Coronary artery disease Mother    Diabetes Mother    Hypertension Mother    Coronary artery disease Father    Diabetes Father        Grandfather   Hypertension Father    Stroke Father    Other Father        colostomy bag   Breast cancer Sister    Cancer Other        niece--breast   Diabetes Sister    Hypertension Sister    Mental illness Sister        x 4   Esophageal cancer Neg Hx    Stomach cancer Neg Hx    Rectal cancer Neg Hx    Colon cancer Neg Hx    Colon polyps Neg Hx  SOCIAL HISTORY:  reports that she quit smoking about 9 years ago. Her smoking use included cigarettes. She has a 40.00 pack-year smoking history. She has never used smokeless tobacco. She reports that she does not drink alcohol and does not use drugs. She is divorced and lives in [place]. She ***   ALLERGIES: Sulfonamide derivatives   MEDICATIONS:  Current Outpatient Medications  Medication Sig Dispense Refill   albuterol (VENTOLIN HFA) 108 (90 Base) MCG/ACT inhaler INHALE TWO PUFFS BY MOUTH INTO THE LUNGS EVERY 6 HOURS AS NEEDED FOR WHEEZING OR SHORTNESS OF BREATH 6.7 g 5   Calcium Carbonate-Vitamin D 600-400 MG-UNIT tablet Take 1 tablet by mouth 2 (two) times daily.     cholecalciferol (VITAMIN D) 1000 UNITS tablet Take 3,000 Units by mouth daily.      DEXILANT 60 MG capsule TAKE ONE CAPSULE BY MOUTH DAILY 90 capsule 1   LORazepam (ATIVAN) 1 MG tablet Take  1 mg by mouth 2 (two) times daily as needed for anxiety.      OLANZapine (ZYPREXA) 5 MG tablet Take 7.5 mg by mouth daily.      OXYGEN Inhale 2 L into the lungs.      Spacer/Aero-Holding Chambers (AEROCHAMBER MV) inhaler Use as instructed 1 each 0   Tiotropium Bromide-Olodaterol (STIOLTO RESPIMAT) 2.5-2.5 MCG/ACT AERS INHALE TWO PUFFS INTO THE LUNGS DAILY 4 g 4   vitamin B-12 (CYANOCOBALAMIN) 1000 MCG tablet TAKE ONE TABLET BY MOUTH DAILY 30 tablet 4   No current facility-administered medications for this visit.     REVIEW OF SYSTEMS: On review of systems, the patient reports that she is doing ***  PHYSICAL EXAM:  Wt Readings from Last 3 Encounters:  05/10/21 150 lb 9.6 oz (68.3 kg)  03/26/21 151 lb 6.4 oz (68.7 kg)  03/21/21 149 lb (67.6 kg)   Temp Readings from Last 3 Encounters:  05/10/21 98.3 F (36.8 C) (Oral)  03/26/21 98.1 F (36.7 C) (Temporal)  02/22/21 98 F (36.7 C) (Oral)   BP Readings from Last 3 Encounters:  05/10/21 118/66  03/26/21 128/86  03/21/21 118/80   Pulse Readings from Last 3 Encounters:  05/10/21 100  03/26/21 81  03/21/21 94    In general this is a well appearing *** female in no acute distress. She's alert and oriented x4 and appropriate throughout the examination. Cardiopulmonary assessment is negative for acute distress and she exhibits normal effort. Bilateral breast exam is deferred.    ECOG = ***  0 - Asymptomatic (Fully active, able to carry on all predisease activities without restriction)  1 - Symptomatic but completely ambulatory (Restricted in physically strenuous activity but ambulatory and able to carry out work of a light or sedentary nature. For example, light housework, office work)  2 - Symptomatic, <50% in bed during the day (Ambulatory and capable of all self care but unable to carry out any work activities. Up and about more than 50% of waking hours)  3 - Symptomatic, >50% in bed, but not bedbound (Capable of only limited  self-care, confined to bed or chair 50% or more of waking hours)  4 - Bedbound (Completely disabled. Cannot carry on any self-care. Totally confined to bed or chair)  5 - Death   Eustace Pen MM, Creech RH, Tormey DC, et al. [phone]). "Toxicity and response criteria of the [facility]". [place] Oncol. 5 (6): 649-55    LABORATORY DATA:  Lab Results  Component Value Date   WBC 10.6 (H)  07/08/2019   HGB 12.9 07/08/2019   HCT 39.5 07/08/2019   MCV 95.4 07/08/2019   PLT 303 07/08/2019   Lab Results  Component Value Date   NA 139 02/22/2021   K 4.5 02/22/2021   CL 99 02/22/2021   CO2 32 02/22/2021   Lab Results  Component Value Date   ALT 38 (H) 02/22/2021   AST 43 (H) 02/22/2021   ALKPHOS 109 02/22/2021   BILITOT 1.7 (H) 02/22/2021      RADIOGRAPHY: US Abdomen Complete  Result Date: 05/11/2021 CLINICAL DATA:  Abnormal LFTs EXAM: ABDOMEN ULTRASOUND COMPLETE COMPARISON:  None. FINDINGS: Gallbladder: Surgically absent. Common bile duct: Diameter: 1.1 cm, within normal limits given history of cholecystectomy. Liver: No focal lesion identified. Diffusely increased parenchymal echogenicity. Portal vein is patent on color Doppler imaging with normal direction of blood flow towards the liver. IVC: No abnormality visualized. Pancreas: Visualized portion unremarkable. Spleen: Size and appearance within normal limits. Right Kidney: Length: 11 cm.  No mass or hydronephrosis visualized. Left Kidney: Length: 10 cm.  No mass or hydronephrosis visualized. Abdominal aorta: No aneurysm visualized. Other findings: None. IMPRESSION: Diffusely increased parenchymal echogenicity of the liver which is nonspecific but most commonly seen with hepatic steatosis. Electronically Signed   By: Audie Pinto M.D.   On: 05/11/2021 13:49       IMPRESSION/PLAN: 1. High grade, ER/PR positive DCIS of the left breast. Dr. Lisbeth Renshaw discusses the pathology findings and reviews the nature of  noninvasive breast disease. She's awaiting the scheduling of breast conservation with lumpectomy and Dr. Lisbeth Renshaw discusses the rationale for external radiotherapy to the breast  to reduce risks of local recurrence followed by antiestrogen therapy. We discussed the risks, benefits, short, and long term effects of radiotherapy, as well as the curative intent, and the patient is interested in proceeding. Dr. Lisbeth Renshaw discusses the delivery and logistics of radiotherapy and anticipates a course of 4 weeks of radiotherapy. We will see her back a few weeks after surgery to discuss the simulation process and anticipate we starting radiotherapy about 4-6 weeks after surgery.  2. O2 dependant COPD. She will continue with Dr. Lamonte Sakai in pulmonary medicine as she proceeds with surgery and for the long term.    In a visit lasting *** minutes, greater than 50% of the time was spent face to face reviewing her case, as well as in preparation of, discussing, and coordinating the patient's care.  The above documentation reflects my direct findings during this shared patient visit. Please see the separate note by Dr. Lisbeth Renshaw on this date for the remainder of the patient's plan of care.    Carola Rhine, [facility]    **Disclaimer: This note was dictated with voice recognition software. Similar sounding words can inadvertently be transcribed and this note may contain transcription errors which may not have been corrected upon publication of note.**

## 2021-06-04 NOTE — Progress Notes (Incomplete)
New Breast Cancer Diagnosis: Left Breast- UOQ  Did patient present with symptoms (if so, please note symptoms) or screening mammography?:Screening Calcifications    Location and Extent of disease :left breast. Located in the upper outer quadrant, measured 5 cm.   Adenopathy .  Histology per Pathology Report: grade high, DCIS with calcifications and necrosis.  Receptor Status: ER(positive), PR (positive), Her2-neu (), Ki-(%)  Surgeon and surgical plan, if any:  Dr. Brantley Stage 05/13/2021  -Discussed breast conserving surgery versus mastectomy with reconstruction. -She would prefer a lumpectomy. I talked to her that this could be possible but she will lose some volume on her left side and she needs to see both medical and radiation oncology to see if she is a candidate given her COPD.  -She is on home oxygen but certainly a lumpectomy would be easier on her.  -There is a risk she would need a mastectomy and I talked about mastectomy as well and the pros and cons of that approach.  -She would like to proceed with lumpectomy and I discussed left breast seed localized lumpectomy.   Medical oncologist, treatment if any:   Dr. Chryl Heck 06/13/2021   Family History of Breast/Ovarian/Prostate Cancer:   Lymphedema issues, if any:      Pain issues, if any:     SAFETY ISSUES: Prior radiation?  Pacemaker/ICD?  Possible current pregnancy? Hysterectomy Is the patient on methotrexate?   Current Complaints / other details:

## 2021-06-05 ENCOUNTER — Ambulatory Visit: Payer: Medicare Other | Admitting: Radiation Oncology

## 2021-06-05 ENCOUNTER — Ambulatory Visit: Payer: Medicare Other

## 2021-06-05 DIAGNOSIS — D0512 Intraductal carcinoma in situ of left breast: Secondary | ICD-10-CM

## 2021-06-07 ENCOUNTER — Other Ambulatory Visit: Payer: Self-pay | Admitting: Surgery

## 2021-06-07 DIAGNOSIS — D0512 Intraductal carcinoma in situ of left breast: Secondary | ICD-10-CM

## 2021-06-10 NOTE — Progress Notes (Incomplete)
Radiation Oncology         [phone] ________________________________  Name: Abigail Castillo        MRN: [id number]  Date of Service: 06/13/2021 DOB: July 29, 1950  CC:Abigail Castillo, Abigail Sciara, NP  Erroll Luna, MD     REFERRING PHYSICIAN: Erroll Luna, MD   DIAGNOSIS: There were no encounter diagnoses.   HISTORY OF PRESENT ILLNESS: Abigail Castillo is a 71 y.o. female seen at the request of Dr. Brantley Stage for a new diagnosis of left breast cancer. The patient was noted to have a CSL of the breast in 2017, and recent screening mammogram showed a 5 cm group of calcifications in the upper outer quadrant. There was a second group of calcifications but the main site was biopsied anteriorly and posteriorly and was consistent with high grade, ER/PR positive DCIS with calcifications and necrosis. She was offered lumpectomy versus mastectomy due to risks of breast volume loss but is motivated for breast conservation and getting set up for this. She's seen to discuss adjuvant radiotherapy and sees Dr. Chryl Heck next week.      PREVIOUS RADIATION THERAPY: {EXAM; YES/NO:19492::"No"}   PAST MEDICAL HISTORY:  Past Medical History:  Diagnosis Date   Acute on chronic respiratory failure with hypoxia (HCC) 07/05/2019   Anxiety    Blood transfusion    Blood transfusion without reported diagnosis    Chronic mental illness    COPD (chronic obstructive pulmonary disease) ([place])    COVID-19 virus infection 07/06/2019   DEPRESSION 05/31/2009   Emphysema of lung (HCC)    Esophageal stricture 03/14/2015   Hepatic steatosis    History of home oxygen therapy    History of pneumonia    Osteoporosis    Shortness of breath dyspnea    Tobacco abuse    ongoing   Vitamin B 12 deficiency    Vitamin D deficiency        PAST SURGICAL HISTORY: Past Surgical History:  Procedure Laterality Date   ABDOMINAL HYSTERECTOMY     BREAST EXCISIONAL BIOPSY Left 2017   BREAST LUMPECTOMY  1972   left breast   BREAST  LUMPECTOMY WITH RADIOACTIVE SEED LOCALIZATION Left 11/22/2015   Procedure: LEFT BREAST LUMPECTOMY WITH RADIOACTIVE SEED LOCALIZATION;  Surgeon: Erroll Luna, MD;  Location: [facility];  Service: General;  Laterality: Left;   BREAST SURGERY  ?2015   lumpectomy, left   CERVICAL CONE BIOPSY     CHOLECYSTECTOMY     COLONOSCOPY     ESOPHAGOGASTRODUODENOSCOPY (EGD) WITH ESOPHAGEAL DILATION     UPPER GASTROINTESTINAL ENDOSCOPY       FAMILY HISTORY:  Family History  Problem Relation Age of Onset   Coronary artery disease Mother    Diabetes Mother    Hypertension Mother    Coronary artery disease Father    Diabetes Father        Grandfather   Hypertension Father    Stroke Father    Other Father        colostomy bag   Breast cancer Sister    Cancer Other        niece--breast   Diabetes Sister    Hypertension Sister    Mental illness Sister        x 4   Esophageal cancer Neg Hx    Stomach cancer Neg Hx    Rectal cancer Neg Hx    Colon cancer Neg Hx    Colon polyps Neg Hx      SOCIAL HISTORY:  reports that she  quit smoking about 9 years ago. Her smoking use included cigarettes. She has a 40.00 pack-year smoking history. She has never used smokeless tobacco. She reports that she does not drink alcohol and does not use drugs.   ALLERGIES: Sulfonamide derivatives   MEDICATIONS:  Current Outpatient Medications  Medication Sig Dispense Refill   albuterol (VENTOLIN HFA) 108 (90 Base) MCG/ACT inhaler INHALE TWO PUFFS BY MOUTH INTO THE LUNGS EVERY 6 HOURS AS NEEDED FOR WHEEZING OR SHORTNESS OF BREATH 6.7 g 5   Calcium Carbonate-Vitamin D 600-400 MG-UNIT tablet Take 1 tablet by mouth 2 (two) times daily.     cholecalciferol (VITAMIN D) 1000 UNITS tablet Take 3,000 Units by mouth daily.      DEXILANT 60 MG capsule TAKE ONE CAPSULE BY MOUTH DAILY 90 capsule 1   LORazepam (ATIVAN) 1 MG tablet Take 1 mg by mouth 2 (two) times daily as needed for anxiety.      OLANZapine (ZYPREXA) 5 MG tablet  Take 7.5 mg by mouth daily.      OXYGEN Inhale 2 L into the lungs.      Spacer/Aero-Holding Chambers (AEROCHAMBER MV) inhaler Use as instructed 1 each 0   Tiotropium Bromide-Olodaterol (STIOLTO RESPIMAT) 2.5-2.5 MCG/ACT AERS INHALE TWO PUFFS INTO THE LUNGS DAILY 4 g 4   vitamin B-12 (CYANOCOBALAMIN) 1000 MCG tablet TAKE ONE TABLET BY MOUTH DAILY 30 tablet 4   No current facility-administered medications for this visit.     REVIEW OF SYSTEMS: On review of systems, the patient reports that she is doing ***    PHYSICAL EXAM:  Wt Readings from Last 3 Encounters:  05/10/21 150 lb 9.6 oz (68.3 kg)  03/26/21 151 lb 6.4 oz (68.7 kg)  03/21/21 149 lb (67.6 kg)   Temp Readings from Last 3 Encounters:  05/10/21 98.3 F (36.8 C) (Oral)  03/26/21 98.1 F (36.7 C) (Temporal)  02/22/21 98 F (36.7 C) (Oral)   BP Readings from Last 3 Encounters:  05/10/21 118/66  03/26/21 128/86  03/21/21 118/80   Pulse Readings from Last 3 Encounters:  05/10/21 100  03/26/21 81  03/21/21 94    In general this is a well appearing *** female in no acute distress. She's alert and oriented x4 and appropriate throughout the examination. Cardiopulmonary assessment is negative for acute distress and she exhibits normal effort. Bilateral breast exam is deferred.    ECOG = ***  0 - Asymptomatic (Fully active, able to carry on all predisease activities without restriction)  1 - Symptomatic but completely ambulatory (Restricted in physically strenuous activity but ambulatory and able to carry out work of a light or sedentary nature. For example, light housework, office work)  2 - Symptomatic, <50% in bed during the day (Ambulatory and capable of all self care but unable to carry out any work activities. Up and about more than 50% of waking hours)  3 - Symptomatic, >50% in bed, but not bedbound (Capable of only limited self-care, confined to bed or chair 50% or more of waking hours)  4 - Bedbound (Completely  disabled. Cannot carry on any self-care. Totally confined to bed or chair)  5 - Death   Eustace Pen MM, Creech RH, Tormey DC, et al. [phone]). "Toxicity and response criteria of the [facility]". [place] Oncol. 5 (6): 649-55    LABORATORY DATA:  Lab Results  Component Value Date   WBC 10.6 (H) 07/08/2019   HGB 12.9 07/08/2019   HCT 39.5 07/08/2019   MCV  95.4 07/08/2019   PLT 303 07/08/2019   Lab Results  Component Value Date   NA 139 02/22/2021   K 4.5 02/22/2021   CL 99 02/22/2021   CO2 32 02/22/2021   Lab Results  Component Value Date   ALT 38 (H) 02/22/2021   AST 43 (H) 02/22/2021   ALKPHOS 109 02/22/2021   BILITOT 1.7 (H) 02/22/2021      RADIOGRAPHY: No results found.     IMPRESSION/PLAN: 1.         High grade, ER/PR positive DCIS of the left breast. Dr. Lisbeth Renshaw discusses the pathology findings and reviews the nature of noninvasive breast disease. She's awaiting the scheduling of breast conservation with lumpectomy and Dr. Lisbeth Renshaw discusses the rationale for external radiotherapy to the breast  to reduce risks of local recurrence followed by antiestrogen therapy. We discussed the risks, benefits, short, and long term effects of radiotherapy, as well as the curative intent, and the patient is interested in proceeding. Dr. Lisbeth Renshaw discusses the delivery and logistics of radiotherapy and anticipates a course of 4 weeks of radiotherapy. We will see her back a few weeks after surgery to discuss the simulation process and anticipate we starting radiotherapy about 4-6 weeks after surgery.  2.         O2 dependant COPD. She will continue with Dr. Lamonte Sakai in pulmonary medicine as she proceeds with surgery and for the long term.    In a visit lasting *** minutes, greater than 50% of the time was spent face to face reviewing her case, as well as in preparation of, discussing, and coordinating the patient's care.  The above documentation reflects my direct findings during  this shared patient visit. Please see the separate note by Dr. Lisbeth Renshaw on this date for the remainder of the patient's plan of care.    Carola Rhine, [facility]    **Disclaimer: This note was dictated with voice recognition software. Similar sounding words can inadvertently be transcribed and this note may contain transcription errors which may not have been corrected upon publication of note.**

## 2021-06-12 ENCOUNTER — Telehealth: Payer: Self-pay | Admitting: Radiation Oncology

## 2021-06-12 NOTE — Progress Notes (Signed)
New Breast Cancer Diagnosis: Left Breast- UOQ   Did patient present with symptoms (if so, please note symptoms) or screening mammography?:Screening Calcifications     Location and Extent of disease :left breast. Located in the upper outer quadrant, measured 5 cm.   Adenopathy .   Histology per Pathology Report: grade high, DCIS with calcifications and necrosis. 05/03/2021 Receptor Status: ER(positive), PR (positive), Her2-neu (), Ki-(%)   Surgeon and surgical plan, if any:  Dr. Brantley Stage 05/13/2021  -Discussed breast conserving surgery versus mastectomy with reconstruction. -She would prefer a lumpectomy. I talked to her that this could be possible but she will lose some volume on her left side and she needs to see both medical and radiation oncology to see if she is a candidate given her COPD.  -She is on home oxygen but certainly a lumpectomy would be easier on her.  -There is a risk she would need a mastectomy and I talked about mastectomy as well and the pros and cons of that approach.  -She would like to proceed with lumpectomy and I discussed left breast seed localized lumpectomy. -Left Breast Lumpectomy with radioactive seed localization 07/03/2021     Medical oncologist, treatment if any:   Dr. Chryl Heck -No appointment scheduled at this time.    Family History of Breast/Ovarian/Prostate Cancer: 3 Sisters had breast.   Lymphedema issues, if any: no      Pain issues, if any: No   SAFETY ISSUES: Prior radiation? No Pacemaker/ICD? No Possible current pregnancy? Hysterectomy Is the patient on methotrexate? no   Current Complaints / other details: -Wears Oxygen at 3 liters

## 2021-06-12 NOTE — Progress Notes (Incomplete)
New Breast Cancer Diagnosis: Left Breast- UOQ   Did patient present with symptoms (if so, please note symptoms) or screening mammography?:Screening Calcifications     Location and Extent of disease :left breast. Located in the upper outer quadrant, measured 5 cm.   Adenopathy .   Histology per Pathology Report: grade high, DCIS with calcifications and necrosis. 05/03/2021  Receptor Status: ER(positive), PR (positive), Her2-neu (), Ki-(%)   Surgeon and surgical plan, if any:  Dr. Brantley Stage 05/13/2021  -Discussed breast conserving surgery versus mastectomy with reconstruction. -She would prefer a lumpectomy. I talked to her that this could be possible but she will lose some volume on her left side and she needs to see both medical and radiation oncology to see if she is a candidate given her COPD.  -She is on home oxygen but certainly a lumpectomy would be easier on her.  -There is a risk she would need a mastectomy and I talked about mastectomy as well and the pros and cons of that approach.  -She would like to proceed with lumpectomy and I discussed left breast seed localized lumpectomy. -Left Breast Lumpectomy with radioactive seed localization 07/03/2021     Medical oncologist, treatment if any:   Dr. Chryl Heck 06/13/2021    Family History of Breast/Ovarian/Prostate Cancer:    Lymphedema issues, if any:       Pain issues, if any:      SAFETY ISSUES: Prior radiation?  Pacemaker/ICD?  Possible current pregnancy? Hysterectomy Is the patient on methotrexate?    Current Complaints / other details:

## 2021-06-13 ENCOUNTER — Ambulatory Visit
Admission: RE | Admit: 2021-06-13 | Discharge: 2021-06-13 | Disposition: A | Discharge status: Home / Self Care | Payer: Medicare Other | Source: Ambulatory Visit | Attending: Radiation Oncology | Admitting: Radiation Oncology

## 2021-06-13 ENCOUNTER — Ambulatory Visit: Payer: Medicare Other | Admitting: Radiation Oncology

## 2021-06-13 ENCOUNTER — Ambulatory Visit: Payer: Medicare Other

## 2021-06-13 ENCOUNTER — Encounter: Payer: Self-pay | Admitting: Radiation Oncology

## 2021-06-13 ENCOUNTER — Inpatient Hospital Stay: Payer: Medicare Other | Admitting: Hematology and Oncology

## 2021-06-13 ENCOUNTER — Other Ambulatory Visit: Payer: Self-pay

## 2021-06-13 ENCOUNTER — Inpatient Hospital Stay: Payer: Medicare Other

## 2021-06-13 VITALS — Ht 62.0 in

## 2021-06-13 DIAGNOSIS — D0512 Intraductal carcinoma in situ of left breast: Secondary | ICD-10-CM

## 2021-06-13 NOTE — Progress Notes (Signed)
Radiation Oncology         [phone] ________________________________  Initial Outpatient Consultation - Conducted via telephone due to current COVID-19 concerns for limiting patient exposure  I spoke with the patient to conduct this consult visit via telephone to spare the patient unnecessary potential exposure in the healthcare setting during the current COVID-19 pandemic. The patient was notified in advance and was offered a [place] meeting to allow for face to face communication but unfortunately reported that they did not have the appropriate resources/technology to support such a visit and instead preferred to proceed with a telephone consult.   Name: Abigail Castillo        MRN: [id number]  Date of Service: 06/13/2021 DOB: [birth year]  CC:[name], [name], NP  Erroll Luna, MD     REFERRING PHYSICIAN: Erroll Luna, MD   DIAGNOSIS: The encounter diagnosis was Ductal carcinoma in situ (DCIS) of left breast.   HISTORY OF PRESENT ILLNESS: Abigail Castillo is a 71 y.o. female seen at the request of Dr. Brantley Stage for a new diagnosis of left breast cancer. The patient was noted to have a CSL of the breast in 2017, and recent screening mammogram showed a 5 cm group of calcifications in the upper outer quadrant. There was a second group of calcifications but the main site was biopsied anteriorly and posteriorly and was consistent with high grade, ER/PR positive DCIS with calcifications and necrosis. She was offered lumpectomy versus mastectomy due to risks of breast volume loss but is motivated for breast conservation and getting set up for this. She's seen to discuss adjuvant radiotherapy.    PREVIOUS RADIATION THERAPY: No   PAST MEDICAL HISTORY:  Past Medical History:  Diagnosis Date   Acute on chronic respiratory failure with hypoxia ([place]) 07/05/2019   Anxiety    Blood transfusion    Blood transfusion without reported diagnosis    Breast cancer ([place]) 05/13/2021   Chronic mental  illness    COPD (chronic obstructive pulmonary disease) ([place])    COVID-19 virus infection 07/06/2019   DEPRESSION 05/31/2009   Emphysema of lung (HCC)    Esophageal stricture 03/14/2015   Hepatic steatosis    History of home oxygen therapy    History of pneumonia    Osteoporosis    Shortness of breath dyspnea    Tobacco abuse    ongoing   Vitamin B 12 deficiency    Vitamin D deficiency        PAST SURGICAL HISTORY: Past Surgical History:  Procedure Laterality Date   ABDOMINAL HYSTERECTOMY     BREAST EXCISIONAL BIOPSY Left 2017   BREAST LUMPECTOMY  1972   left breast   BREAST LUMPECTOMY WITH RADIOACTIVE SEED LOCALIZATION Left 11/22/2015   Procedure: LEFT BREAST LUMPECTOMY WITH RADIOACTIVE SEED LOCALIZATION;  Surgeon: Erroll Luna, MD;  Location: [facility];  Service: General;  Laterality: Left;   BREAST SURGERY  ?2015   lumpectomy, left   CERVICAL CONE BIOPSY     CHOLECYSTECTOMY     COLONOSCOPY     ESOPHAGOGASTRODUODENOSCOPY (EGD) WITH ESOPHAGEAL DILATION     UPPER GASTROINTESTINAL ENDOSCOPY       FAMILY HISTORY:  Family History  Problem Relation Age of Onset   Coronary artery disease Mother    Diabetes Mother    Hypertension Mother    Coronary artery disease Father    Diabetes Father        Grandfather   Hypertension Father    Stroke Father    Other Father  colostomy bag   Breast cancer Sister    Breast cancer Sister    Diabetes Sister    Breast cancer Sister    Hypertension Sister    Mental illness Sister        x 4   Cancer Other        niece--breast   Esophageal cancer Neg Hx    Stomach cancer Neg Hx    Rectal cancer Neg Hx    Colon cancer Neg Hx    Colon polyps Neg Hx      SOCIAL HISTORY:  reports that she quit smoking about 9 years ago. Her smoking use included cigarettes. She has a 40.00 pack-year smoking history. She has never used smokeless tobacco. She reports that she does not drink alcohol and does not use drugs. The patient is divorced  and lives in [place].   ALLERGIES: Sulfonamide derivatives   MEDICATIONS:  Current Outpatient Medications  Medication Sig Dispense Refill   albuterol (VENTOLIN HFA) 108 (90 Base) MCG/ACT inhaler INHALE TWO PUFFS BY MOUTH INTO THE LUNGS EVERY 6 HOURS AS NEEDED FOR WHEEZING OR SHORTNESS OF BREATH 6.7 g 5   Calcium Carbonate-Vitamin D 600-400 MG-UNIT tablet Take 1 tablet by mouth 2 (two) times daily.     cholecalciferol (VITAMIN D) 1000 UNITS tablet Take 3,000 Units by mouth daily.      DEXILANT 60 MG capsule TAKE ONE CAPSULE BY MOUTH DAILY 90 capsule 1   LORazepam (ATIVAN) 1 MG tablet Take 1 mg by mouth 2 (two) times daily as needed for anxiety.      OLANZapine (ZYPREXA) 5 MG tablet Take 7.5 mg by mouth daily.      OXYGEN Inhale 3 L into the lungs.     Spacer/Aero-Holding Chambers (AEROCHAMBER MV) inhaler Use as instructed 1 each 0   Tiotropium Bromide-Olodaterol (STIOLTO RESPIMAT) 2.5-2.5 MCG/ACT AERS INHALE TWO PUFFS INTO THE LUNGS DAILY 4 g 4   vitamin B-12 (CYANOCOBALAMIN) 1000 MCG tablet TAKE ONE TABLET BY MOUTH DAILY 30 tablet 4   vitamin E 180 MG (400 UNITS) capsule Take 400 Units by mouth daily.     No current facility-administered medications for this encounter.     REVIEW OF SYSTEMS: On review of systems, the patient reports that she is doing fair. She is dependant on 3L of oxygen continuously and has significant fatigue. It is a struggle for her to get to the doctors office. Her daughter and sister do most of her errands and she can do some laundry, but otherwise does not leave the house independantly. No breast complaints are noted.      PHYSICAL EXAM:  Unable to assess due to encounter type.     ECOG = 2  0 - Asymptomatic (Fully active, able to carry on all predisease activities without restriction)  1 - Symptomatic but completely ambulatory (Restricted in physically strenuous activity but ambulatory and able to carry out work of a light or sedentary nature. For  example, light housework, office work)  2 - Symptomatic, <50% in bed during the day (Ambulatory and capable of all self care but unable to carry out any work activities. Up and about more than 50% of waking hours)  3 - Symptomatic, >50% in bed, but not bedbound (Capable of only limited self-care, confined to bed or chair 50% or more of waking hours)  4 - Bedbound (Completely disabled. Cannot carry on any self-care. Totally confined to bed or chair)  5 - Death   Lolita Rieger, Daniel Nones  RH, Tormey DC, et al. [phone]). "Toxicity and response criteria of the [facility]". [place] Oncol. 5 (6): 649-55    LABORATORY DATA:  Lab Results  Component Value Date   WBC 10.6 (H) 07/08/2019   HGB 12.9 07/08/2019   HCT 39.5 07/08/2019   MCV 95.4 07/08/2019   PLT 303 07/08/2019   Lab Results  Component Value Date   NA 139 02/22/2021   K 4.5 02/22/2021   CL 99 02/22/2021   CO2 32 02/22/2021   Lab Results  Component Value Date   ALT 38 (H) 02/22/2021   AST 43 (H) 02/22/2021   ALKPHOS 109 02/22/2021   BILITOT 1.7 (H) 02/22/2021      RADIOGRAPHY: No results found.     IMPRESSION/PLAN: 1.         High grade, ER/PR positive DCIS of the left breast. Dr. Lisbeth Renshaw discusses the pathology findings and reviews the nature of noninvasive breast disease. She's awaiting the scheduling of breast conservation with lumpectomy and Dr. Lisbeth Renshaw discusses the rationale for external radiotherapy to the breast  to reduce risks of local recurrence followed by antiestrogen therapy. We discussed the risks, benefits, short, and long term effects of radiotherapy, as well as the curative intent, and the patient and her daughter are concerned about the ability for her logistically to get to treatment given the distance they live to our office and her lack of mobility. We discussed that while she does not quite fit the situation to forgo radiation, Dr. Lisbeth Renshaw prefers to review her final pathology margins and  then make the decision with the patient regarding treatment. She may still decline given physical limitations of getting to appointments.   2.         O2 dependant COPD. She will continue with Dr. Lamonte Sakai in pulmonary medicine as she proceeds with surgery and for the long term.    Given current concerns for patient exposure during the COVID-19 pandemic, this encounter was conducted via telephone.  The patient has provided two factor identification and has given verbal consent for this type of encounter and has been advised to only accept a meeting of this type in a secure network environment. The time spent during this encounter was 60 minutes including preparation, discussion, and coordination of the patient's care. The attendants for this meeting include Blenda Nicely, RN, Dr. Lisbeth Renshaw, Hayden Pedro  and Sundra Aland and her daughter Willey.  During the encounter,  Blenda Nicely, RN, Dr. Lisbeth Renshaw, and Hayden Pedro were located at [facility] Radiation Oncology Department.  CONLEIGH HEINLEIN was located at home with her daughter Diona Browner.    The above documentation reflects my direct findings during this shared patient visit. Please see the separate note by Dr. Lisbeth Renshaw on this date for the remainder of the patient's plan of care.    Carola Rhine, [facility]    **Disclaimer: This note was dictated with voice recognition software. Similar sounding words can inadvertently be transcribed and this note may contain transcription errors which may not have been corrected upon publication of note.**

## 2021-06-14 ENCOUNTER — Telehealth: Payer: Self-pay | Admitting: Hematology and Oncology

## 2021-06-14 NOTE — Telephone Encounter (Signed)
R/s appt per 1/20 staff msg from [employer]. Called pt, no answer and no vm was available. Will try to contact pt again tomorrow.

## 2021-06-21 ENCOUNTER — Inpatient Hospital Stay: Payer: Medicare Other | Admitting: Hematology and Oncology

## 2021-06-24 ENCOUNTER — Other Ambulatory Visit: Payer: Self-pay

## 2021-06-24 ENCOUNTER — Encounter (HOSPITAL_BASED_OUTPATIENT_CLINIC_OR_DEPARTMENT_OTHER): Payer: Self-pay | Admitting: Surgery

## 2021-06-24 ENCOUNTER — Telehealth: Payer: Self-pay | Admitting: Hematology and Oncology

## 2021-06-24 NOTE — Telephone Encounter (Signed)
R/s pt's new pt appt with Dr. Chryl Heck. Pt is aware of new appt date and time. Pt is aware to arrive to 15 mins prior to appt time.

## 2021-06-25 ENCOUNTER — Ambulatory Visit: Payer: Self-pay | Admitting: Surgery

## 2021-06-25 DIAGNOSIS — N6320 Unspecified lump in the left breast, unspecified quadrant: Secondary | ICD-10-CM

## 2021-06-26 ENCOUNTER — Other Ambulatory Visit: Payer: Self-pay | Admitting: Surgery

## 2021-06-26 DIAGNOSIS — D0512 Intraductal carcinoma in situ of left breast: Secondary | ICD-10-CM

## 2021-07-02 ENCOUNTER — Encounter: Payer: Self-pay | Admitting: Hematology and Oncology

## 2021-07-02 ENCOUNTER — Other Ambulatory Visit: Payer: Self-pay

## 2021-07-02 ENCOUNTER — Encounter (HOSPITAL_COMMUNITY): Payer: Self-pay | Admitting: Surgery

## 2021-07-02 ENCOUNTER — Encounter (HOSPITAL_BASED_OUTPATIENT_CLINIC_OR_DEPARTMENT_OTHER): Payer: Self-pay | Admitting: Surgery

## 2021-07-02 ENCOUNTER — Ambulatory Visit
Admission: RE | Admit: 2021-07-02 | Discharge: 2021-07-02 | Disposition: A | Discharge status: Home / Self Care | Payer: Medicare Other | Source: Ambulatory Visit | Attending: Surgery | Admitting: Surgery

## 2021-07-02 ENCOUNTER — Encounter (HOSPITAL_BASED_OUTPATIENT_CLINIC_OR_DEPARTMENT_OTHER)
Admission: RE | Admit: 2021-07-02 | Discharge: 2021-07-02 | Disposition: A | Discharge status: Home / Self Care | Payer: Medicare Other | Source: Ambulatory Visit | Attending: Surgery | Admitting: Surgery

## 2021-07-02 ENCOUNTER — Inpatient Hospital Stay: Payer: Medicare Other | Attending: Hematology and Oncology | Admitting: Hematology and Oncology

## 2021-07-02 VITALS — BP 157/94 | HR 103 | Temp 97.9°F | Resp 16 | Ht 62.0 in | Wt 146.2 lb

## 2021-07-02 DIAGNOSIS — Z170 Estrogen receptor positive status [ER+]: Secondary | ICD-10-CM | POA: Diagnosis not present

## 2021-07-02 DIAGNOSIS — R928 Other abnormal and inconclusive findings on diagnostic imaging of breast: Secondary | ICD-10-CM | POA: Diagnosis not present

## 2021-07-02 DIAGNOSIS — M810 Age-related osteoporosis without current pathological fracture: Secondary | ICD-10-CM | POA: Diagnosis not present

## 2021-07-02 DIAGNOSIS — Z79899 Other long term (current) drug therapy: Secondary | ICD-10-CM | POA: Diagnosis not present

## 2021-07-02 DIAGNOSIS — D0512 Intraductal carcinoma in situ of left breast: Secondary | ICD-10-CM | POA: Insufficient documentation

## 2021-07-02 DIAGNOSIS — K219 Gastro-esophageal reflux disease without esophagitis: Secondary | ICD-10-CM | POA: Diagnosis not present

## 2021-07-02 DIAGNOSIS — F32A Depression, unspecified: Secondary | ICD-10-CM | POA: Diagnosis not present

## 2021-07-02 DIAGNOSIS — K760 Fatty (change of) liver, not elsewhere classified: Secondary | ICD-10-CM | POA: Insufficient documentation

## 2021-07-02 DIAGNOSIS — J449 Chronic obstructive pulmonary disease, unspecified: Secondary | ICD-10-CM | POA: Diagnosis not present

## 2021-07-02 DIAGNOSIS — Z87891 Personal history of nicotine dependence: Secondary | ICD-10-CM | POA: Diagnosis not present

## 2021-07-02 DIAGNOSIS — Z9981 Dependence on supplemental oxygen: Secondary | ICD-10-CM | POA: Insufficient documentation

## 2021-07-02 DIAGNOSIS — F419 Anxiety disorder, unspecified: Secondary | ICD-10-CM | POA: Diagnosis not present

## 2021-07-02 LAB — BASIC METABOLIC PANEL
Anion gap: 10 (ref 5–15)
BUN: 5 mg/dL — ABNORMAL LOW (ref 8–23)
CO2: 27 mmol/L (ref 22–32)
Calcium: 9.3 mg/dL (ref 8.9–10.3)
Chloride: 103 mmol/L (ref 98–111)
Creatinine, Ser: 0.67 mg/dL (ref 0.44–1.00)
GFR, Estimated: >60 mL/min (ref >60)
Glucose, Bld: 108 mg/dL — ABNORMAL HIGH (ref 70–99)
Potassium: 4.5 mmol/L (ref 3.5–5.1)
Sodium: 140 mmol/L (ref 135–145)

## 2021-07-02 NOTE — Progress Notes (Signed)
PCP - [name] Cardiologist - denies EKG - 07/02/21 Chest x-ray -  ECHO -  Cardiac Cath -   ERAS Protcol - eras 0530 COVID TEST- n/a  Anesthesia review: already reviewed by Dr. Elgie Congo   -------------  SDW INSTRUCTIONS:  Your procedure is scheduled on Wednesday 2/8. Please report to [facility] Main Entrance "A" at [place].M., and check in at the Admitting office. Call this number if you have problems the morning of surgery: [phone]   Remember: Do not eat after midnight the night before your surgery  You may drink clear liquids until 0530 AM the morning of your surgery.   Clear liquids allowed are: Water, Non-Citrus Juices (without pulp), Carbonated Beverages, Clear Tea, Black Coffee Only, and Gatorade   Medications to take morning of surgery with a sip of water include: Dexilant Zyprexa  As of today, STOP taking any Aspirin (unless otherwise instructed by your surgeon), Aleve, Naproxen, Ibuprofen, Motrin, Advil, Goody's, BC's, all herbal medications, fish oil, and all vitamins.    The Morning of Surgery Do not wear jewelry, make-up or nail polish. Do not wear lotions, powders, or perfumes/colognes, or deodorant Do not bring valuables to the hospital. [facility] is not responsible for any belongings or valuables.  If you are a smoker, DO NOT Smoke 24 hours prior to surgery  If you wear a CPAP at night please bring your mask the morning of surgery   Remember that you must have someone to transport you home after your surgery, and remain with you for 24 hours if you are discharged the same day.  Please bring cases for contacts, glasses, hearing aids, dentures or bridgework because it cannot be worn into surgery.   Patients discharged the day of surgery will not be allowed to drive home.   Please shower the NIGHT BEFORE/MORNING OF SURGERY (use antibacterial soap like DIAL soap if possible). Wear comfortable clothes the morning of surgery. Oral Hygiene is also  important to reduce your risk of infection.  Remember - BRUSH YOUR TEETH THE MORNING OF SURGERY WITH YOUR REGULAR TOOTHPASTE  Patient denies shortness of breath, fever, cough and chest pain.

## 2021-07-02 NOTE — Progress Notes (Signed)
[place] CONSULT NOTE  Patient Care Team: [name], NP as PCP - General (Internal Medicine) Gatha Mayer, MD as Consulting Physician (Gastroenterology) Lendon Colonel, MD as Referring Physician (Psychiatry)  CHIEF COMPLAINTS/PURPOSE OF CONSULTATION:  Newly diagnosed breast cancer  HISTORY OF PRESENTING ILLNESS:  Abigail Castillo 71 y.o. female is here because of recent diagnosis of left breast DCIS  This is a 71 year old postmenopausal female patient who presented with abnormal screening mammogram.  Mammogram showed a 5 cm area of distortion and calcifications in the left breast and core biopsy showed DCIS high-grade with comedonecrosis.  Prognostic showed ER +100% strong staining intensity PR 40% positive strong staining intensity she was seen by Dr. Brantley Stage from breast surgery for further recommendations. She is scheduled for surgery tomorrow.  She is now referred to medical oncology and radiation oncology for adjuvant recommendations.  Past medical history significant for COPD, oxygen dependency, osteoporosis. She is here in a wheelchair with her daughter.  She is very restricted in terms of mobility at home because of her COPD and oxygen dependency but is able to be independent with her activities of daily living.  She is not quite sure if she would pursue radiation even if it was recommended.  Besides COPD, she has chronic mental illness and takes Zyprexa.  She quit smoking several years ago.  She denies any personal history of strokes or heart attacks or DVTs.  She does have a diagnosis of osteoporosis which is not yet treated. I reviewed her records extensively and collaborated the history with the patient.  SUMMARY OF ONCOLOGIC HISTORY: Oncology History   No history exists.     MEDICAL HISTORY:  Past Medical History:  Diagnosis Date   Acute on chronic respiratory failure with hypoxia ([place]) 07/05/2019   Anxiety    Blood transfusion    Blood transfusion  without reported diagnosis    Breast cancer ([place]) 05/13/2021   Chronic mental illness    COPD (chronic obstructive pulmonary disease) ([place])    COVID-19 virus infection 07/06/2019   DEPRESSION 05/31/2009   Emphysema of lung ([facility])    Esophageal stricture 03/14/2015   GERD (gastroesophageal reflux disease)    Hepatic steatosis    History of home oxygen therapy    History of pneumonia    Osteoporosis    Oxygen dependent    on O2 at 2-3L/min 24hr/day   Shortness of breath dyspnea    Tobacco abuse    ongoing   Vitamin B 12 deficiency    Vitamin D deficiency     SURGICAL HISTORY: Past Surgical History:  Procedure Laterality Date   ABDOMINAL HYSTERECTOMY     BREAST EXCISIONAL BIOPSY Left 2017   BREAST LUMPECTOMY  1972   left breast   BREAST LUMPECTOMY WITH RADIOACTIVE SEED LOCALIZATION Left 11/22/2015   Procedure: LEFT BREAST LUMPECTOMY WITH RADIOACTIVE SEED LOCALIZATION;  Surgeon: Erroll Luna, MD;  Location: [facility];  Service: General;  Laterality: Left;   BREAST SURGERY  ?2015   lumpectomy, left   CERVICAL CONE BIOPSY     CHOLECYSTECTOMY     COLONOSCOPY     ESOPHAGOGASTRODUODENOSCOPY (EGD) WITH ESOPHAGEAL DILATION     UPPER GASTROINTESTINAL ENDOSCOPY      SOCIAL HISTORY: Social History   Socioeconomic History   Marital status: Divorced    Spouse name: Not on file   Number of children: 3   Years of education: Not on file   Highest education level: Not on file  Occupational History  Occupation: disabled    Fish farm manager: UNEMPLOYED  Tobacco Use   Smoking status: Former    Packs/day: 1.00    Years: 40.00    Pack years: 40.00    Types: Cigarettes    Quit date: 08/12/2011    Years since quitting: 9.8   Smokeless tobacco: Never  Substance and Sexual Activity   Alcohol use: No    Alcohol/week: 0.0 standard drinks   Drug use: No   Sexual activity: Never  Other Topics Concern   Not on file  Social History Narrative   Lost a son to Lung CA 1/18   Lives with a son in  [place]   Has a daughter who lives locally   Social Determinants of Health   Financial Resource Strain: Low Risk    Difficulty of Paying Living Expenses: Not hard at all  Food Insecurity: No Food Insecurity   Worried About Charity fundraiser in the Last Year: Never true   Arboriculturist in the Last Year: Never true  Transportation Needs: No Transportation Needs   Lack of Transportation (Medical): No   Lack of Transportation (Non-Medical): No  Physical Activity: Inactive   Days of Exercise per Week: 0 days   Minutes of Exercise per Session: 0 min  Stress: No Stress Concern Present   Feeling of Stress : Not at all  Social Connections: Moderately Isolated   Frequency of Communication with Friends and Family: More than three times a week   Frequency of Social Gatherings with Friends and Family: More than three times a week   Attends Religious Services: More than 4 times per year   Active Member of [employer] or Organizations: No   Attends Music therapist: Never   Marital Status: Divorced  Human resources officer Violence: Not At Risk   Fear of Current or Ex-Partner: No   Emotionally Abused: No   Physically Abused: No   Sexually Abused: No    FAMILY HISTORY: Family History  Problem Relation Age of Onset   Coronary artery disease Mother    Diabetes Mother    Hypertension Mother    Coronary artery disease Father    Diabetes Father        Grandfather   Hypertension Father    Stroke Father    Other Father        colostomy bag   Breast cancer Sister    Breast cancer Sister    Diabetes Sister    Breast cancer Sister    Hypertension Sister    Mental illness Sister        x 4   Cancer Other        niece--breast   Esophageal cancer Neg Hx    Stomach cancer Neg Hx    Rectal cancer Neg Hx    Colon cancer Neg Hx    Colon polyps Neg Hx     ALLERGIES:  is allergic to sulfonamide derivatives.  MEDICATIONS:  Current Outpatient Medications  Medication Sig Dispense  Refill   albuterol (VENTOLIN HFA) 108 (90 Base) MCG/ACT inhaler INHALE TWO PUFFS BY MOUTH INTO THE LUNGS EVERY 6 HOURS AS NEEDED FOR WHEEZING OR SHORTNESS OF BREATH 6.7 g 5   Calcium Carbonate-Vitamin D 600-400 MG-UNIT tablet Take 1 tablet by mouth 2 (two) times daily.     cholecalciferol (VITAMIN D) 1000 UNITS tablet Take 3,000 Units by mouth daily.      DEXILANT 60 MG capsule TAKE ONE CAPSULE BY MOUTH DAILY 90 capsule 1  LORazepam (ATIVAN) 1 MG tablet Take 1 mg by mouth 2 (two) times daily as needed for anxiety.      OLANZapine (ZYPREXA) 5 MG tablet Take 7.5 mg by mouth daily.      OXYGEN Inhale 3 L into the lungs.     Spacer/Aero-Holding Chambers (AEROCHAMBER MV) inhaler Use as instructed 1 each 0   Tiotropium Bromide-Olodaterol (STIOLTO RESPIMAT) 2.5-2.5 MCG/ACT AERS INHALE TWO PUFFS INTO THE LUNGS DAILY 4 g 4   vitamin B-12 (CYANOCOBALAMIN) 1000 MCG tablet TAKE ONE TABLET BY MOUTH DAILY 30 tablet 4   vitamin E 180 MG (400 UNITS) capsule Take 400 Units by mouth daily.     No current facility-administered medications for this visit.    PHYSICAL EXAMINATION: ECOG PERFORMANCE STATUS: 0 - Asymptomatic  Vitals:   07/02/21 1006  BP: (!) 157/94  Pulse: (!) 103  Resp: 16  Temp: 97.9 F (36.6 C)  SpO2: 94%   Filed Weights   07/02/21 1006  Weight: 146 lb 3.2 oz (66.3 kg)    GENERAL:alert, no distress and comfortable in a wheelchair SKIN: skin color, texture, turgor are normal, no rashes or significant lesions EYES: normal, conjunctiva are pink and non-injected, sclera clear OROPHARYNX:no exudate, no erythema and lips, buccal mucosa, and tongue normal  NECK: No palpable cervical lymphadenopathy  LUNGS: Scattered bronchial breathing, no adventitious sounds HEART: Distant heart sounds from COPD  ABDOMEN:abdomen soft, non-tender and normal bowel sounds Musculoskeletal:no cyanosis of digits and no clubbing  PSYCH: alert & oriented x 3 with fluent speech BREAST: deferred  LABORATORY  DATA:  I have reviewed the data as listed Lab Results  Component Value Date   WBC 10.6 (H) 07/08/2019   HGB 12.9 07/08/2019   HCT 39.5 07/08/2019   MCV 95.4 07/08/2019   PLT 303 07/08/2019   Lab Results  Component Value Date   NA 139 02/22/2021   K 4.5 02/22/2021   CL 99 02/22/2021   CO2 32 02/22/2021    RADIOGRAPHIC STUDIES: I have personally reviewed the radiological reports and agreed with the findings in the report.  ASSESSMENT AND PLAN:   This is a pleasant 71 year old female patient with left breast DCIS, high-grade, with comedonecrosis and ER/PR positive is here for adjuvant recommendations.  She prefers lumpectomy according to her discussion with Dr. Brantley Stage.  We have discussed the following details about DCIS.  We have discussed the following details about DCIS.  Pathology review: I discussed with the patient the difference between DCIS and invasive breast cancer. It is considered a precancerous lesion. DCIS is classified as a Stage 0 breast cancer. It is generally detected through mammograms as calcifications. We discussed the significance of grades and its impact on prognosis. We also discussed the importance of ER and PR receptors and their implications to adjuvant treatment options. Prognosis of DCIS dependence on grade and degree of comedo necrosis. It is anticipated that if not treated, 20-30% of DCIS can develop into invasive breast cancer.  Recommendation: 1. Breast conserving surgery 2. Followed by adjuvant radiation therapy 3. Followed by antiestrogen therapy with tamoxifen/aromatase inhibitors 5 years  Tamoxifen counseling: We discussed the risks and benefits of tamoxifen. These include but not limited to insomnia, hot flashes, mood changes, vaginal dryness, and weight gain. Although rare, serious side effects including endometrial cancer, risk of blood clots were also discussed. We strongly believe that the benefits far outweigh the risks. Patient understands  these risks and consented to starting treatment. Planned treatment duration is 5 years.  Aromatase inhibitors counseling: We have discussed the mechanism of action of aromatase inhibitors today.  We have discussed adverse effects including but not limited to menopausal symptoms, increased risk of osteoporosis and fractures, cardiovascular events, arthralgias and myalgias.  We do believe that the benefits far outweigh the risks.  Plan treatment duration of 5 years.  She has baseline history of osteoporosis and is currently not on any treatment.  Although she has baseline history of osteoporosis given her current sedentary lifestyle, we have discussed about considering aromatase inhibitors as adjuvant antiestrogen therapy along with bisphosphonates for management of osteoporosis.  She is willing to consider this as an adjuvant treatment.  She will return to clinic in about 3 to 4 weeks for adjuvant antiestrogen therapy.  All questions were answered. The patient knows to call the clinic with any problems, questions or concerns. I spent 60 minutes in the care of this patient including history, physical exam, review of records, counseling and coordination of care as mentioned above.    Benay Pike, MD 07/02/21

## 2021-07-02 NOTE — Progress Notes (Signed)
Patient on continuous home oxygen 2-3L due to history of COPD.  Pulse ox-99% on oxygen, chart reviewed and EKG reviewed by Dr. Elgie Congo, will move case to main OR.  Called Dr. Josetta Huddle office and spoke with Merleen Nicely.

## 2021-07-02 NOTE — Progress Notes (Signed)

## 2021-07-03 ENCOUNTER — Ambulatory Visit (HOSPITAL_COMMUNITY): Payer: Medicare Other | Admitting: Anesthesiology

## 2021-07-03 ENCOUNTER — Other Ambulatory Visit: Payer: Self-pay

## 2021-07-03 ENCOUNTER — Ambulatory Visit (HOSPITAL_COMMUNITY)
Admission: RE | Admit: 2021-07-03 | Discharge: 2021-07-03 | Disposition: A | Discharge status: Home / Self Care | Payer: Medicare Other | Attending: Surgery | Admitting: Surgery

## 2021-07-03 ENCOUNTER — Encounter (HOSPITAL_COMMUNITY): Payer: Self-pay | Admitting: Surgery

## 2021-07-03 ENCOUNTER — Ambulatory Visit
Admission: RE | Admit: 2021-07-03 | Discharge: 2021-07-03 | Disposition: A | Discharge status: Home / Self Care | Payer: Medicaid Other | Source: Ambulatory Visit | Attending: Surgery | Admitting: Surgery

## 2021-07-03 ENCOUNTER — Encounter (HOSPITAL_COMMUNITY)
Admission: RE | Disposition: A | Discharge status: Home / Self Care | Payer: Self-pay | Source: Home / Self Care | Attending: Surgery

## 2021-07-03 ENCOUNTER — Ambulatory Visit
Admission: RE | Admit: 2021-07-03 | Discharge: 2021-07-03 | Disposition: A | Discharge status: Home / Self Care | Payer: Medicare Other | Source: Ambulatory Visit | Attending: Surgery | Admitting: Surgery

## 2021-07-03 DIAGNOSIS — Z9981 Dependence on supplemental oxygen: Secondary | ICD-10-CM | POA: Diagnosis not present

## 2021-07-03 DIAGNOSIS — Z87891 Personal history of nicotine dependence: Secondary | ICD-10-CM | POA: Insufficient documentation

## 2021-07-03 DIAGNOSIS — J449 Chronic obstructive pulmonary disease, unspecified: Secondary | ICD-10-CM | POA: Diagnosis not present

## 2021-07-03 DIAGNOSIS — D242 Benign neoplasm of left breast: Secondary | ICD-10-CM | POA: Diagnosis not present

## 2021-07-03 DIAGNOSIS — D0512 Intraductal carcinoma in situ of left breast: Secondary | ICD-10-CM

## 2021-07-03 DIAGNOSIS — F32A Depression, unspecified: Secondary | ICD-10-CM | POA: Diagnosis not present

## 2021-07-03 DIAGNOSIS — K219 Gastro-esophageal reflux disease without esophagitis: Secondary | ICD-10-CM | POA: Diagnosis not present

## 2021-07-03 DIAGNOSIS — F419 Anxiety disorder, unspecified: Secondary | ICD-10-CM | POA: Insufficient documentation

## 2021-07-03 DIAGNOSIS — Z79899 Other long term (current) drug therapy: Secondary | ICD-10-CM | POA: Diagnosis not present

## 2021-07-03 DIAGNOSIS — R928 Other abnormal and inconclusive findings on diagnostic imaging of breast: Secondary | ICD-10-CM | POA: Diagnosis not present

## 2021-07-03 HISTORY — PX: BREAST LUMPECTOMY WITH RADIOACTIVE SEED LOCALIZATION: SHX6424

## 2021-07-03 HISTORY — DX: Dependence on supplemental oxygen: Z99.81

## 2021-07-03 HISTORY — DX: Gastro-esophageal reflux disease without esophagitis: K21.9

## 2021-07-03 LAB — CBC
HCT: 40.6 % (ref 36.0–46.0)
Hemoglobin: 13.4 g/dL (ref 12.0–15.0)
MCH: 32.1 pg (ref 26.0–34.0)
MCHC: 33 g/dL (ref 30.0–36.0)
MCV: 97.4 fL (ref 80.0–100.0)
Platelets: 194 10*3/uL (ref 150–400)
RBC: 4.17 MIL/uL (ref 3.87–5.11)
RDW: 12.3 % (ref 11.5–15.5)
WBC: 5.5 10*3/uL (ref 4.0–10.5)
nRBC: 0 % (ref 0.0–0.2)

## 2021-07-03 SURGERY — BREAST LUMPECTOMY WITH RADIOACTIVE SEED LOCALIZATION
Anesthesia: General | Site: Breast | Laterality: Left

## 2021-07-03 MED ORDER — CHLORHEXIDINE GLUCONATE CLOTH 2 % EX PADS: 6.0000 | MEDICATED_PAD | Freq: Once | CUTANEOUS | Status: DC

## 2021-07-03 MED ORDER — LIDOCAINE 2% (20 MG/ML) 5 ML SYRINGE
INTRAMUSCULAR | Status: DC | PRN
  Administered 2021-07-03: 60 mg via INTRAVENOUS

## 2021-07-03 MED ORDER — IBUPROFEN 800 MG PO TABS: 800.0000 mg | ORAL_TABLET | Freq: Three times a day (TID) | ORAL | 0 refills | Status: DC | PRN

## 2021-07-03 MED ORDER — ORAL CARE MOUTH RINSE: 15.0000 mL | Freq: Once | OROMUCOSAL | Status: AC

## 2021-07-03 MED ORDER — OXYCODONE HCL 5 MG PO TABS: 5.0000 mg | ORAL_TABLET | Freq: Once | ORAL | Status: DC | PRN

## 2021-07-03 MED ORDER — PROPOFOL 10 MG/ML IV BOLUS
INTRAVENOUS | Status: DC | PRN
  Administered 2021-07-03: 120 mg via INTRAVENOUS
  Administered 2021-07-03 (×2): 20 mg via INTRAVENOUS

## 2021-07-03 MED ORDER — BUPIVACAINE-EPINEPHRINE 0.25% -1:200000 IJ SOLN
INTRAMUSCULAR | Status: DC | PRN
  Administered 2021-07-03: 28 mL

## 2021-07-03 MED ORDER — PHENYLEPHRINE 40 MCG/ML (10ML) SYRINGE FOR IV PUSH (FOR BLOOD PRESSURE SUPPORT)
PREFILLED_SYRINGE | INTRAVENOUS | Status: DC | PRN
  Administered 2021-07-03 (×2): 120 ug via INTRAVENOUS
  Administered 2021-07-03 (×2): 80 ug via INTRAVENOUS

## 2021-07-03 MED ORDER — 0.9 % SODIUM CHLORIDE (POUR BTL) OPTIME
TOPICAL | Status: DC | PRN
Start: 2021-07-03 — End: 2021-07-03
  Administered 2021-07-03: 1000 mL

## 2021-07-03 MED ORDER — PROPOFOL 10 MG/ML IV BOLUS
INTRAVENOUS | Status: AC
  Filled 2021-07-03: qty 20

## 2021-07-03 MED ORDER — MIDAZOLAM HCL 2 MG/2ML IJ SOLN
INTRAMUSCULAR | Status: AC
  Filled 2021-07-03: qty 2

## 2021-07-03 MED ORDER — FENTANYL CITRATE (PF) 250 MCG/5ML IJ SOLN
INTRAMUSCULAR | Status: DC | PRN
Start: 2021-07-03 — End: 2021-07-03
  Administered 2021-07-03 (×2): 25 ug via INTRAVENOUS

## 2021-07-03 MED ORDER — ALBUTEROL SULFATE (2.5 MG/3ML) 0.083% IN NEBU
INHALATION_SOLUTION | RESPIRATORY_TRACT | Status: AC
  Filled 2021-07-03: qty 3

## 2021-07-03 MED ORDER — FENTANYL CITRATE (PF) 100 MCG/2ML IJ SOLN
25.0000 ug | INTRAMUSCULAR | Status: DC | PRN
  Administered 2021-07-03: 50 ug via INTRAVENOUS

## 2021-07-03 MED ORDER — LACTATED RINGERS IV SOLN: INTRAVENOUS | Status: DC

## 2021-07-03 MED ORDER — ACETAMINOPHEN 500 MG PO TABS
1000.0000 mg | ORAL_TABLET | ORAL | Status: AC
  Administered 2021-07-03: 1000 mg via ORAL
  Filled 2021-07-03: qty 2

## 2021-07-03 MED ORDER — CEFAZOLIN SODIUM-DEXTROSE 2-4 GM/100ML-% IV SOLN
2.0000 g | INTRAVENOUS | Status: AC
  Administered 2021-07-03: 2 g via INTRAVENOUS
  Filled 2021-07-03: qty 100

## 2021-07-03 MED ORDER — ONDANSETRON HCL 4 MG/2ML IJ SOLN
INTRAMUSCULAR | Status: DC | PRN
Start: 2021-07-03 — End: 2021-07-03
  Administered 2021-07-03: 4 mg via INTRAVENOUS

## 2021-07-03 MED ORDER — ALBUTEROL SULFATE (2.5 MG/3ML) 0.083% IN NEBU
2.5000 mg | INHALATION_SOLUTION | Freq: Once | RESPIRATORY_TRACT | Status: AC
  Administered 2021-07-03: 2.5 mg via RESPIRATORY_TRACT
  Filled 2021-07-03: qty 3

## 2021-07-03 MED ORDER — FENTANYL CITRATE (PF) 100 MCG/2ML IJ SOLN
INTRAMUSCULAR | Status: AC
  Filled 2021-07-03: qty 2

## 2021-07-03 MED ORDER — FENTANYL CITRATE (PF) 250 MCG/5ML IJ SOLN
INTRAMUSCULAR | Status: AC
  Filled 2021-07-03: qty 5

## 2021-07-03 MED ORDER — OXYCODONE HCL 5 MG/5ML PO SOLN: 5.0000 mg | Freq: Once | ORAL | Status: DC | PRN

## 2021-07-03 MED ORDER — IPRATROPIUM-ALBUTEROL 0.5-2.5 (3) MG/3ML IN SOLN: 3.0000 mL | Freq: Once | RESPIRATORY_TRACT | Status: DC

## 2021-07-03 MED ORDER — MIDAZOLAM HCL 2 MG/2ML IJ SOLN
INTRAMUSCULAR | Status: DC | PRN
Start: 2021-07-03 — End: 2021-07-03
  Administered 2021-07-03: 1 mg via INTRAVENOUS

## 2021-07-03 MED ORDER — DEXAMETHASONE SODIUM PHOSPHATE 10 MG/ML IJ SOLN
INTRAMUSCULAR | Status: DC | PRN
  Administered 2021-07-03: 10 mg via INTRAVENOUS

## 2021-07-03 MED ORDER — CHLORHEXIDINE GLUCONATE 0.12 % MT SOLN
15.0000 mL | Freq: Once | OROMUCOSAL | Status: AC
  Administered 2021-07-03: 15 mL via OROMUCOSAL
  Filled 2021-07-03: qty 15

## 2021-07-03 MED ORDER — TRAMADOL HCL 50 MG PO TABS
50.0000 mg | ORAL_TABLET | Freq: Four times a day (QID) | ORAL | 0 refills | Status: DC | PRN
Start: 2021-07-03 — End: 2021-10-28

## 2021-07-03 MED ORDER — BUPIVACAINE-EPINEPHRINE (PF) 0.25% -1:200000 IJ SOLN
INTRAMUSCULAR | Status: AC
  Filled 2021-07-03: qty 30

## 2021-07-03 SURGICAL SUPPLY — 36 items
ADH SKN CLS APL DERMABOND .7 (GAUZE/BANDAGES/DRESSINGS) ×1
APL PRP STRL LF DISP 70% ISPRP (MISCELLANEOUS) ×1
APPLIER CLIP 9.375 MED OPEN (MISCELLANEOUS) ×2
APR CLP MED 9.3 20 MLT OPN (MISCELLANEOUS) ×1
BAG COUNTER SPONGE SURGICOUNT (BAG) ×3 IMPLANT
BAG SPNG CNTER NS LX DISP (BAG) ×1
BINDER BREAST LRG (GAUZE/BANDAGES/DRESSINGS) ×1 IMPLANT
CANISTER SUCT 3000ML PPV (MISCELLANEOUS) ×1 IMPLANT
CHLORAPREP W/TINT 26 (MISCELLANEOUS) ×3 IMPLANT
CLIP APPLIE 9.375 MED OPEN (MISCELLANEOUS) IMPLANT
COVER PROBE W GEL 5X96 (DRAPES) ×3 IMPLANT
COVER SURGICAL LIGHT HANDLE (MISCELLANEOUS) ×3 IMPLANT
DERMABOND ADVANCED (GAUZE/BANDAGES/DRESSINGS) ×1
DERMABOND ADVANCED .7 DNX12 (GAUZE/BANDAGES/DRESSINGS) ×2 IMPLANT
DEVICE DUBIN SPECIMEN MAMMOGRA (MISCELLANEOUS) ×3 IMPLANT
DRAPE CHEST BREAST 15X10 FENES (DRAPES) ×3 IMPLANT
ELECT CAUTERY BLADE 6.4 (BLADE) ×3 IMPLANT
ELECT REM PT RETURN 9FT ADLT (ELECTROSURGICAL) ×2
ELECTRODE REM PT RTRN 9FT ADLT (ELECTROSURGICAL) ×2 IMPLANT
GLOVE SRG 8 PF TXTR STRL LF DI (GLOVE) ×2 IMPLANT
GLOVE SURG ENC MOIS LTX SZ8 (GLOVE) ×3 IMPLANT
GLOVE SURG UNDER POLY LF SZ8 (GLOVE) ×2
GOWN STRL REUS W/ TWL LRG LVL3 (GOWN DISPOSABLE) ×2 IMPLANT
GOWN STRL REUS W/ TWL XL LVL3 (GOWN DISPOSABLE) ×2 IMPLANT
GOWN STRL REUS W/TWL LRG LVL3 (GOWN DISPOSABLE) ×2
GOWN STRL REUS W/TWL XL LVL3 (GOWN DISPOSABLE) ×2
KIT BASIN OR (CUSTOM PROCEDURE TRAY) ×3 IMPLANT
KIT MARKER MARGIN INK (KITS) ×1 IMPLANT
NDL HYPO 25GX1X1/2 BEV (NEEDLE) IMPLANT
NEEDLE HYPO 25GX1X1/2 BEV (NEEDLE) ×2 IMPLANT
NS IRRIG 1000ML POUR BTL (IV SOLUTION) ×1 IMPLANT
PACK GENERAL/GYN (CUSTOM PROCEDURE TRAY) ×3 IMPLANT
SUT MNCRL AB 4-0 PS2 18 (SUTURE) ×3 IMPLANT
SUT VIC AB 3-0 SH 8-18 (SUTURE) ×3 IMPLANT
SYR CONTROL 10ML LL (SYRINGE) ×1 IMPLANT
TOWEL GREEN STERILE FF (TOWEL DISPOSABLE) ×1 IMPLANT

## 2021-07-03 NOTE — Transfer of Care (Signed)
Immediate Anesthesia Transfer of Care Note  Patient: Abigail Castillo  Procedure(s) Performed: LEFT BREAST LUMPECTOMY WITH RADIOACTIVE SEED LOCALIZATION (Left: Breast)  Patient Location: PACU  Anesthesia Type:General  Level of Consciousness: drowsy, patient cooperative and responds to stimulation  Airway & Oxygen Therapy: Patient Spontanous Breathing and Patient connected to nasal cannula oxygen  Post-op Assessment: Report given to RN and Post -op Vital signs reviewed and stable  Post vital signs: Reviewed and stable  Last Vitals:  Vitals Value Taken Time  BP 148/101 07/03/21 0940  Temp    Pulse 98 07/03/21 0942  Resp 16 07/03/21 0942  SpO2 97 % 07/03/21 0942  Vitals shown include unvalidated device data.  Last Pain:  Vitals:   07/03/21 0722  TempSrc:   PainSc: 0-No pain      Patients Stated Pain Goal: 0 (87/57/97 2820)  Complications: No notable events documented.

## 2021-07-03 NOTE — Anesthesia Preprocedure Evaluation (Addendum)
Anesthesia Evaluation  Patient identified by MRN, date of birth, ID band Patient awake    Reviewed: Allergy & Precautions, NPO status , Patient's Chart, lab work & pertinent test results  History of Anesthesia Complications Negative for: history of anesthetic complications  Airway Mallampati: II  TM Distance: >3 FB Neck ROM: Full    Dental  (+) Edentulous Lower, Edentulous Upper   Pulmonary COPD (2-3L O2 at home),  COPD inhaler and oxygen dependent, former smoker,     + decreased breath sounds+ wheezing      Cardiovascular negative cardio ROS Normal cardiovascular exam     Neuro/Psych Anxiety Depression Schizophrenia negative neurological ROS     GI/Hepatic Neg liver ROS, GERD  Medicated and Controlled,  Endo/Other  negative endocrine ROS  Renal/GU negative Renal ROS  negative genitourinary   Musculoskeletal negative musculoskeletal ROS (+)   Abdominal   Peds  Hematology negative hematology ROS (+)   Anesthesia Other Findings Day of surgery medications reviewed with patient.  Reproductive/Obstetrics negative OB ROS                           Anesthesia Physical Anesthesia Plan  ASA: 2  Anesthesia Plan: General   Post-op Pain Management: Tylenol PO (pre-op)   Induction: Intravenous  PONV Risk Score and Plan: 3 and Treatment may vary due to age or medical condition, Ondansetron and Dexamethasone  Airway Management Planned: LMA  Additional Equipment: None  Intra-op Plan:   Post-operative Plan: Extubation in OR  Informed Consent: I have reviewed the patients History and Physical, chart, labs and discussed the procedure including the risks, benefits and alternatives for the proposed anesthesia with the patient or authorized representative who has indicated his/her understanding and acceptance.     Dental advisory given  Plan Discussed with: CRNA  Anesthesia Plan Comments:  (Duoneb given in preop with improvement of wheezing prior to procedure. Daiva Huge, MD)      Anesthesia Quick Evaluation

## 2021-07-03 NOTE — Anesthesia Postprocedure Evaluation (Signed)
Anesthesia Post Note  Patient: Abigail Castillo  Procedure(s) Performed: LEFT BREAST LUMPECTOMY WITH RADIOACTIVE SEED LOCALIZATION (Left: Breast)     Patient location during evaluation: PACU Anesthesia Type: General Level of consciousness: awake and alert Pain management: pain level controlled Vital Signs Assessment: post-procedure vital signs reviewed and stable Respiratory status: spontaneous breathing, nonlabored ventilation and respiratory function stable Cardiovascular status: blood pressure returned to baseline Postop Assessment: no apparent nausea or vomiting Anesthetic complications: no   No notable events documented.  Last Vitals:  Vitals:   07/03/21 0955 07/03/21 1010  BP: 118/83 (!) 126/96  Pulse: 91 86  Resp: 15 13  Temp:  36.6 C  SpO2: 97% 94%    Last Pain:  Vitals:   07/03/21 1010  TempSrc:   PainSc: [place]

## 2021-07-03 NOTE — Anesthesia Procedure Notes (Signed)
Procedure Name: LMA Insertion Date/Time: 07/03/2021 8:43 AM Performed by: Janace Litten, CRNA Pre-anesthesia Checklist: Patient identified, Emergency Drugs available, Suction available and Patient being monitored Patient Re-evaluated:Patient Re-evaluated prior to induction Oxygen Delivery Method: Circle System Utilized Preoxygenation: Pre-oxygenation with 100% oxygen Induction Type: IV induction Ventilation: Mask ventilation without difficulty LMA: LMA inserted LMA Size: 3.0 Number of attempts: 1 Airway Equipment and Method: Bite block Placement Confirmation: positive ETCO2 Tube secured with: Tape Dental Injury: Teeth and Oropharynx as per pre-operative assessment

## 2021-07-03 NOTE — Op Note (Signed)
Preoperative diagnosis: Left breast DCIS upper outer quadrant  Postoperative diagnosis: Same  Procedure: Left breast seed localized lumpectomy using 2 seeds to bracket the area of disease in the left breast upper outer quadrant  Surgeon: Erroll Luna, MD  Anesthesia: LMA with 0.25% Marcaine  EBL: Minimal  Specimen: Left breast tissue with 2 seed  and 2 clips verified by Faxitron.  Additional margins also shaved and sent separately.  All tissue was oriented with ink  Drains: None  Indications: The patient is a 71 year old female found to have left breast DCIS on work-up.  This was high-grade and measured about 5 cm.  She was not an appropriate candidate for medical management.  She had multiple medical problems as well we talked about the pros and cons of surgery.  We felt that lumpectomy would be the least disruptive for her but would also adequately treat her disease.  We discussed the pros and cons of radiation therapy and the use of antiestrogen therapy postoperatively.  After discussion of all the options as well as nonoperative management and reviewing her significant comorbidities, she agreed to proceed with left breast lumpectomy seed localized.The procedure has been discussed with the patient.  Alternative therapies have been discussed with the patient.  Operative risks include bleeding,  Infection,  Organ injury,  Nerve injury,  Blood vessel injury,  DVT,  Pulmonary embolism,  Death,  And possible reoperation.  Medical management risks include worsening of present situation.  The success of the procedure is 50 -90 % at treating patients symptoms.  The procedure has been discussed with the patient. Alternatives to surgery have been discussed with the patient.  Risks of surgery include bleeding,  Infection,  Seroma formation, death,  and the need for further surgery.   The patient understands and wishes to proceed.    Description of procedure: The patient was met in the holding area and  questions were answered.  Left breast was marked as correct site.  She was then taken back to the operative room.  She was placed upon upon the OR table.  After induction of general anesthesia, left breast was prepped and draped in a sterile fashion.  Timeout performed.  Films were available for review.  She received appropriate preoperative antibiotics.  Neoprobe used to identify both seeds left breast upper outer quadrant.  These were marked with a pen.  Local anesthetic was infiltrated around this area.  A transverse incision was made between the signals.  Dissection was carried superiorly and toward the upper outer quadrant and then back behind the nipple.  All tissue was excised with a grossly negative margin.  The Faxitron image revealed the seed and clip to be present.  I took additional shave margins as well and these were sent separately after orientation with ink.  Cavity irrigated made hemostatic.  Clips were placed to mark it.  We then closed the cavity with 3-0 Vicryl.  4 Monocryl used to approximate the skin in a subcuticular fashion.  Dermabond applied.  All counts found to be correct.  Breast binder placed.  The patient was awoke extubated taken to recovery in satisfactory condition.

## 2021-07-03 NOTE — Discharge Instructions (Addendum)
[place] Office Phone Number [phone]  BREAST BIOPSY/ PARTIAL MASTECTOMY: POST OP INSTRUCTIONS  Always review your discharge instruction sheet given to you by the facility where your surgery was performed.  IF YOU HAVE DISABILITY OR FAMILY LEAVE FORMS, YOU MUST BRING THEM TO THE OFFICE FOR PROCESSING.  DO NOT GIVE THEM TO YOUR DOCTOR.  A prescription for pain medication may be given to you upon discharge.  Take your pain medication as prescribed, if needed.  If narcotic pain medicine is not needed, then you may take acetaminophen (Tylenol) or ibuprofen (Advil) as needed. Take your usually prescribed medications unless otherwise directed If you need a refill on your pain medication, please contact your pharmacy.  They will contact our office to request authorization.  Prescriptions will not be filled after 5pm or on week-ends. You should eat very light the first 24 hours after surgery, such as soup, crackers, pudding, etc.  Resume your normal diet the day after surgery. Most patients will experience some swelling and bruising in the breast.  Ice packs and a good support bra will help.  Swelling and bruising can take several days to resolve.  It is common to experience some constipation if taking pain medication after surgery.  Increasing fluid intake and taking a stool softener will usually help or prevent this problem from occurring.  A mild laxative (Milk of Magnesia or Miralax) should be taken according to package directions if there are no bowel movements after 48 hours. Unless discharge instructions indicate otherwise, you may remove your bandages 24-48 hours after surgery, and you may shower at that time.  You may have steri-strips (small skin tapes) in place directly over the incision.  These strips should be left on the skin for 7-10 days.  If your surgeon used skin glue on the incision, you may shower in 24 hours.  The glue will flake off over the next 2-3 weeks.  Any  sutures or staples will be removed at the office during your follow-up visit. ACTIVITIES:  You may resume regular daily activities (gradually increasing) beginning the next day.  Wearing a good support bra or sports bra minimizes pain and swelling.  You may have sexual intercourse when it is comfortable. You may drive when you no longer are taking prescription pain medication, you can comfortably wear a seatbelt, and you can safely maneuver your car and apply brakes. RETURN TO WORK:  ______________________________________________________________________________________ Dennis Bast should see your doctor in the office for a follow-up appointment approximately two weeks after your surgery.  Your doctors nurse will typically make your follow-up appointment when she calls you with your pathology report.  Expect your pathology report 2-3 business days after your surgery.  You may call to check if you do not hear from [place] after three days. OTHER INSTRUCTIONS: _______________________________________________________________________________________________ _____________________________________________________________________________________________________________________________________ _____________________________________________________________________________________________________________________________________ _____________________________________________________________________________________________________________________________________  WHEN TO CALL YOUR DOCTOR: Fever over 101.0 Nausea and/or vomiting. Extreme swelling or bruising. Continued bleeding from incision. Increased pain, redness, or drainage from the incision.  The clinic staff is available to answer your questions during regular business hours.  Please dont hesitate to call and ask to speak to one of the nurses for clinical concerns.  If you have a medical emergency, go to the nearest emergency room or call 911.  A surgeon from [facility] is always on call at the hospital.  For further questions, please visit [email or web address]  anesth

## 2021-07-03 NOTE — H&P (Signed)
History of Present Illness: Glena Pharris is a 71 y.o. female who is seen today for abnormal left breast mammogram. She underwent screening mammogram which showed a 5 cm area of distortion and calcifications in the left breast. Core biopsy of 2 sites showed DCIS high-grade with comedonecrosis. Denies any history of breast pain, nipple discharge or breast mass. She has had a cyst removed from her breast in the past. No family history of breast cancer she is aware of. She is on home oxygen due to COPD. She does drop her sats with exertion. She has no complaints today otherwise.    Review of Systems: A complete review of systems was obtained from the patient. I have reviewed this information and discussed as appropriate with the patient. See HPI as well for other ROS.    Medical History: Past Medical History:  Diagnosis Date   Anxiety   COPD (chronic obstructive pulmonary disease) (CMS-HCC)   GERD (gastroesophageal reflux disease)   History of cancer   There is no problem list on file for this patient.  Past Surgical History:  Procedure Laterality Date   MASTECTOMY   removed cyst from breast N/A   removed gallbladder N/A   removed part of cervix N/A    Allergies  Allergen Reactions   Other Hives   Current Outpatient Medications on File Prior to Visit  Medication Sig Dispense Refill   dexlansoprazole (DEXILANT) 60 mg DR capsule Take 1 capsule (60 mg total) by mouth once daily   LORazepam (ATIVAN) 1 MG tablet Take 1 tab twice a day as needed for anxiety   OLANZapine (ZYPREXA) 5 MG tablet Take 1 tab at night   No current facility-administered medications on file prior to visit.   Family History  Problem Relation Age of Onset   Stroke Mother   Obesity Mother   Heart valve disease Mother   Stroke Father   Obesity Father   Heart valve disease Father   Diabetes Father   Obesity Sister   Hyperlipidemia (Elevated cholesterol) Sister   Diabetes Sister   Deep vein thrombosis (DVT  or abnormal blood clot formation) Sister   Breast cancer Sister    Social History   Tobacco Use  Smoking Status Former   Types: Cigarettes  Smokeless Tobacco Never    Social History   Socioeconomic History   Marital status: Divorced  Tobacco Use   Smoking status: Former  Types: Cigarettes   Smokeless tobacco: Never  Vaping Use   Vaping Use: Never used  Substance and Sexual Activity   Alcohol use: Never   Drug use: Never   Objective:   Vitals:  05/13/21 1038  BP: 128/86  Pulse: 100  Temp: 36.9 C (98.5 F)  SpO2: 98%  Weight: 68.8 kg (151 lb 9.6 oz)  Height: 157.5 cm (5\' 2" )   Body mass index is 27.73 kg/m.  Physical Exam Constitutional:  Appearance: Normal appearance.  HENT:  Head: Normocephalic.  Eyes:  Pupils: Pupils are equal, round, and reactive to light.  Cardiovascular:  Rate and Rhythm: Normal rate.  Pulses: Normal pulses.  Pulmonary:  Effort: Pulmonary effort is normal.  Breath sounds: No stridor.  Chest:  Breasts: Right: Normal. No nipple discharge.  Left: Normal. No nipple discharge.  Musculoskeletal:  General: Normal range of motion.  Cervical back: Normal range of motion.  Lymphadenopathy:  Upper Body:  Right upper body: No supraclavicular or axillary adenopathy.  Left upper body: No supraclavicular or axillary adenopathy.  Skin: General: Skin is warm  and dry.  Neurological:  General: No focal deficit present.  Mental Status: She is alert and oriented to person, place, and time.  Psychiatric:  Mood and Affect: Mood normal.  Behavior: Behavior normal.     Labs, Imaging and Diagnostic Testing:  ADDITIONAL INFORMATION: 2. PROGNOSTIC INDICATORS Results: IMMUNOHISTOCHEMICAL AND MORPHOMETRIC ANALYSIS PERFORMED MANUALLY Estrogen Receptor: 100%, POSITIVE, STRONG STAINING INTENSITY Progesterone Receptor: 40%, POSITIVE, STRONG STAINING INTENSITY REFERENCE RANGE ESTROGEN RECEPTOR NEGATIVE 0% POSITIVE =>1% REFERENCE RANGE  PROGESTERONE RECEPTOR NEGATIVE 0% POSITIVE =>1% All controls stained appropriately Thressa Sheller MD Pathologist, Electronic Signature ( Signed 05/07/2021) FINAL DIAGNOSIS Diagnosis 1. Breast, left, needle core biopsy, Upper outer POSTERIOR, coil clip - DUCTAL CARCINOMA IN SITU WITH CALCIFICATIONS AND NECROSIS - SEE COMMENT 2. Breast, left, needle core biopsy, Upper outer ANTERIOR, X clip - DUCTAL CARCINOMA IN SITU WITH CALCIFICATIONS AND NECROSIS - SEE COMMENT 1 of 3 FINAL for [place], Chelsie R ([id number]) Microscopic Comment 1. and 2. based on the biopsy, the ductal carcinoma in situ has a comedo pattern, high nuclear grade and measures 0.4 cm in greatest linear extent. Prognostic markers (ER/PR) are pending and will be reported in an addendum. Dr. Thornell Sartorius reviewed the case and agrees with the above diagnosis. These results were called to [facility] on May 06, 2021. Thressa Sheller MD Pathologist, Electronic  Screening recall for left breast calcifications. Patient has history of prior benign left breast surgical biopsies.   EXAM: DIGITAL DIAGNOSTIC UNILATERAL LEFT MAMMOGRAM WITH CAD   TECHNIQUE: Left digital diagnostic mammography was performed. Mammographic images were processed with CAD.   COMPARISON: Previous exam(s).   ACR Breast Density Category b: There are scattered areas of fibroglandular density.   FINDINGS: There are linearly arranged pleomorphic to coarse heterogeneous calcifications in the left breast, extending posteriorly from the nipple towards the upper-outer quadrant, spanning 5 cm in long axis. A second smaller, 1 cm, group of calcifications lie along the posterosuperior margin of the linear calcifications.   There are no associated masses no associated architectural distortion.   IMPRESSION: 1. Suspicious left breast calcifications, with the linear group spanning 5 cm in long axis. Tissue sampling is recommended.    RECOMMENDATION: 1. Stereotactic core needle biopsy of the left breast calcifications. Recommend 2 stereotactic biopsies targeting the anterior and posterior extents. These procedures were scheduled prior to the patient being discharged from the breast Center.   I have discussed the findings and recommendations with the patient. If applicable, a reminder letter will be sent to the patient regarding the next appointment.   BI-RADS CATEGORY 4: Suspicious.     Electronically Signed By: Lajean Manes M.D. On: 04/24/2021 11:28  Assessment and Plan:   Diagnoses and all orders for this visit:  Ductal carcinoma in situ (DCIS) of left breast - Ambulatory Referral to Oncology-Medical - Ambulatory Referral to Radiation Oncology    Discussed breast conserving surgery versus mastectomy with reconstruction. She would prefer a lumpectomy. I talked to her that this could be possible but she will lose some volume on her left side and she needs to see both medical and radiation oncology to see if she is a candidate given her COPD. She is on home oxygen but certainly a lumpectomy would be easier on her. There is a risk she would need a mastectomy and I talked about mastectomy as well and the pros and cons of that approach. Discussed local regional recurrence, complications and long-term survival and expectations. Risk of surgery include but exclusive of bleeding,  infection, cosmetic deformity, skin loss, skin necrosis, revisional surgery, and the need further treatments and/or procedures. She would like to proceed with lumpectomy and I discussed left breast seed localized lumpectomy. No follow-ups on file.  Kennieth Francois, MD

## 2021-07-03 NOTE — Interval H&P Note (Signed)
History and Physical Interval Note:  07/03/2021 8:06 AM  Abigail Castillo  has presented today for surgery, with the diagnosis of LEFT BREAST DCIS.  The various methods of treatment have been discussed with the patient and family. After consideration of risks, benefits and other options for treatment, the patient has consented to  Procedure(s): LEFT BREAST LUMPECTOMY WITH RADIOACTIVE SEED LOCALIZATION (Left) as a surgical intervention.  The patient's history has been reviewed, patient examined, no change in status, stable for surgery.  I have reviewed the patient's chart and labs.  Questions were answered to the patient's satisfaction.     [place]

## 2021-07-04 ENCOUNTER — Encounter (HOSPITAL_COMMUNITY): Payer: Self-pay | Admitting: Surgery

## 2021-07-04 ENCOUNTER — Encounter: Payer: Self-pay | Admitting: *Deleted

## 2021-07-04 DIAGNOSIS — D0512 Intraductal carcinoma in situ of left breast: Secondary | ICD-10-CM

## 2021-07-08 ENCOUNTER — Other Ambulatory Visit: Payer: Self-pay | Admitting: Family

## 2021-07-08 LAB — SURGICAL PATHOLOGY

## 2021-07-09 ENCOUNTER — Encounter: Payer: Self-pay | Admitting: *Deleted

## 2021-07-22 NOTE — Progress Notes (Signed)
[facility] CONSULT NOTE  Patient Care Team: [name], NP as PCP - General (Internal Medicine) Gatha Mayer, MD as Consulting Physician (Gastroenterology) Lendon Colonel, MD as Referring Physician (Psychiatry) Mauro Kaufmann, RN as Oncology Nurse Navigator Rockwell Germany, RN as Oncology Nurse Navigator  CHIEF COMPLAINTS/PURPOSE OF CONSULTATION:  Newly diagnosed breast cancer  HISTORY OF PRESENTING ILLNESS:  Abigail Castillo 71 y.o. female is here because of recent diagnosis of left breast DCIS  This is a 71 year old postmenopausal female patient who presented with abnormal screening mammogram.  Mammogram showed a 5 cm area of distortion and calcifications in the left breast and core biopsy showed DCIS high-grade with comedonecrosis.   Prognostics showed ER +100% strong staining intensity PR 40% positive strong staining intensity she was seen by Dr. Brantley Stage from breast surgery for further recommendations. She is scheduled for surgery tomorrow.  She is now referred to medical oncology and radiation oncology for adjuvant recommendations.  Past medical history significant for COPD, oxygen dependency, osteoporosis. She does have a diagnosis of osteoporosis which is not yet treated. Since last visit, she had left breast lumpectomy which showed DCIS, high-grade, 0.6 cm, margins uninvolved by carcinoma, previous biopsy site changes, small incidental intraductal papilloma.  Prior prognostic showed ER positive, 100% strong PR positive, 40% strong  Interval history She is healing well from her surgery.  No complaints.  She is scheduled to talk to the radiation oncologist however does not want to proceed with adjuvant radiation.  She most likely would like to take the pill. Rest of the pertinent 10 point ROS reviewed and negative.  I reviewed her records extensively and collaborated the history with the patient.  SUMMARY OF ONCOLOGIC HISTORY: Oncology History   No history  exists.     MEDICAL HISTORY:  Past Medical History:  Diagnosis Date   Acute on chronic respiratory failure with hypoxia ([facility]) 07/05/2019   Anxiety    Blood transfusion    Blood transfusion without reported diagnosis    Breast cancer ([facility]) 05/13/2021   Chronic mental illness    COPD (chronic obstructive pulmonary disease) ([facility])    COVID-19 virus infection 07/06/2019   DEPRESSION 05/31/2009   Emphysema of lung ([facility])    Esophageal stricture 03/14/2015   GERD (gastroesophageal reflux disease)    Hepatic steatosis    History of home oxygen therapy    History of pneumonia    Osteoporosis    Oxygen dependent    on O2 at 2-3L/min 24hr/day   Shortness of breath dyspnea    Tobacco abuse    ongoing   Vitamin B 12 deficiency    Vitamin D deficiency     SURGICAL HISTORY: Past Surgical History:  Procedure Laterality Date   ABDOMINAL HYSTERECTOMY     BREAST EXCISIONAL BIOPSY Left 2017   BREAST LUMPECTOMY  1972   left breast   BREAST LUMPECTOMY WITH RADIOACTIVE SEED LOCALIZATION Left 11/22/2015   Procedure: LEFT BREAST LUMPECTOMY WITH RADIOACTIVE SEED LOCALIZATION;  Surgeon: Erroll Luna, MD;  Location: [facility];  Service: General;  Laterality: Left;   BREAST LUMPECTOMY WITH RADIOACTIVE SEED LOCALIZATION Left 07/03/2021   Procedure: LEFT BREAST LUMPECTOMY WITH RADIOACTIVE SEED LOCALIZATION;  Surgeon: Erroll Luna, MD;  Location: [facility];  Service: General;  Laterality: Left;   BREAST SURGERY  ?2015   lumpectomy, left   CERVICAL CONE BIOPSY     CHOLECYSTECTOMY     COLONOSCOPY     ESOPHAGOGASTRODUODENOSCOPY (EGD) WITH ESOPHAGEAL DILATION  UPPER GASTROINTESTINAL ENDOSCOPY      SOCIAL HISTORY: Social History   Socioeconomic History   Marital status: Divorced    Spouse name: Not on file   Number of children: 3   Years of education: Not on file   Highest education level: Not on file  Occupational History   Occupation: disabled    Employer: UNEMPLOYED  Tobacco Use   Smoking  status: Former    Packs/day: 1.00    Years: 40.00    Pack years: 40.00    Types: Cigarettes    Quit date: 08/12/2011    Years since quitting: 9.9   Smokeless tobacco: Never  Vaping Use   Vaping Use: Never used  Substance and Sexual Activity   Alcohol use: No    Alcohol/week: 0.0 standard drinks   Drug use: No   Sexual activity: Never  Other Topics Concern   Not on file  Social History Narrative   Lost a son to Lung CA 1/18   Lives with a son in [place]   Has a daughter who lives locally   Social Determinants of Health   Financial Resource Strain: Low Risk    Difficulty of Paying Living Expenses: Not hard at all  Food Insecurity: No Food Insecurity   Worried About Charity fundraiser in the Last Year: Never true   Arboriculturist in the Last Year: Never true  Transportation Needs: No Transportation Needs   Lack of Transportation (Medical): No   Lack of Transportation (Non-Medical): No  Physical Activity: Inactive   Days of Exercise per Week: 0 days   Minutes of Exercise per Session: 0 min  Stress: No Stress Concern Present   Feeling of Stress : Not at all  Social Connections: Moderately Isolated   Frequency of Communication with Friends and Family: More than three times a week   Frequency of Social Gatherings with Friends and Family: More than three times a week   Attends Religious Services: More than 4 times per year   Active Member of [employer] or Organizations: No   Attends Music therapist: Never   Marital Status: Divorced  Human resources officer Violence: Not At Risk   Fear of Current or Ex-Partner: No   Emotionally Abused: No   Physically Abused: No   Sexually Abused: No    FAMILY HISTORY: Family History  Problem Relation Age of Onset   Coronary artery disease Mother    Diabetes Mother    Hypertension Mother    Coronary artery disease Father    Diabetes Father        Grandfather   Hypertension Father    Stroke Father    Other Father         colostomy bag   Breast cancer Sister    Breast cancer Sister    Diabetes Sister    Breast cancer Sister    Hypertension Sister    Mental illness Sister        x 4   Cancer Other        niece--breast   Esophageal cancer Neg Hx    Stomach cancer Neg Hx    Rectal cancer Neg Hx    Colon cancer Neg Hx    Colon polyps Neg Hx     ALLERGIES:  is allergic to sulfonamide derivatives.  MEDICATIONS:  Current Outpatient Medications  Medication Sig Dispense Refill   anastrozole (ARIMIDEX) 1 MG tablet Take 1 tablet (1 mg total) by mouth daily. 30 tablet  2   albuterol (VENTOLIN HFA) 108 (90 Base) MCG/ACT inhaler INHALE TWO PUFFS BY MOUTH INTO THE LUNGS EVERY 6 HOURS AS NEEDED FOR WHEEZING OR SHORTNESS OF BREATH 6.7 g 5   Calcium Carbonate-Vitamin D 600-400 MG-UNIT tablet Take 1 tablet by mouth 2 (two) times daily.     cholecalciferol (VITAMIN D) 1000 UNITS tablet Take 3,000 Units by mouth daily.      DEXILANT 60 MG capsule TAKE ONE CAPSULE BY MOUTH DAILY 90 capsule 1   ibuprofen (ADVIL) 800 MG tablet Take 1 tablet (800 mg total) by mouth every 8 (eight) hours as needed. 30 tablet 0   LORazepam (ATIVAN) 1 MG tablet Take 1 mg by mouth 2 (two) times daily as needed for anxiety.      OLANZapine (ZYPREXA) 5 MG tablet Take 7.5 mg by mouth daily.      OXYGEN Inhale 3 L into the lungs.     Spacer/Aero-Holding Chambers (AEROCHAMBER MV) inhaler Use as instructed 1 each 0   Tiotropium Bromide-Olodaterol (STIOLTO RESPIMAT) 2.5-2.5 MCG/ACT AERS INHALE TWO PUFFS INTO THE LUNGS DAILY 4 g 4   traMADol (ULTRAM) 50 MG tablet Take 1 tablet (50 mg total) by mouth every 6 (six) hours as needed. 20 tablet 0   vitamin B-12 (CYANOCOBALAMIN) 1000 MCG tablet TAKE ONE TABLET BY MOUTH DAILY 30 tablet 3   vitamin E 180 MG (400 UNITS) capsule Take 400 Units by mouth daily.     No current facility-administered medications for this visit.    PHYSICAL EXAMINATION: ECOG PERFORMANCE STATUS: 0 - Asymptomatic  There were no  vitals filed for this visit.  There were no vitals filed for this visit.   GENERAL:alert, no distress Breast: Left breast appears to be healing well.  No concerns.  She is in a wheelchair and oxygen dependent  LABORATORY DATA:  I have reviewed the data as listed Lab Results  Component Value Date   WBC 5.5 07/03/2021   HGB 13.4 07/03/2021   HCT 40.6 07/03/2021   MCV 97.4 07/03/2021   PLT 194 07/03/2021   Lab Results  Component Value Date   NA 140 07/02/2021   K 4.5 07/02/2021   CL 103 07/02/2021   CO2 27 07/02/2021    RADIOGRAPHIC STUDIES: I have personally reviewed the radiological reports and agreed with the findings in the report.  ASSESSMENT AND PLAN:   This is a pleasant 71 year old female patient with left breast DCIS, high-grade, with comedonecrosis and ER/PR positive is here for adjuvant recommendations.   Since her last visit she underwent lumpectomy and pathology showed 6 mm of high-grade DCIS.  Prior prognostics ER 100% positive strong staining, PR 40% positive strong staining. We have in the past discussed about considering antiestrogen therapy for 5 years.  We have discussed about both tamoxifen and aromatase inhibitors.  Given risk of DVT/PE, she did not want to proceed with tamoxifen.  She has underlying osteoporosis but would like to be treated for osteoporosis and also to consider anastrozole at the same day.  We have once again discussed mechanism of action of aromatase inhibitors, adverse effects including but not limited to fatigue, postmenopausal symptoms, arthralgias, myalgias, questionable increased risk of cardiovascular events and bone loss.  I think her underlying medical issues are more of a risk for her cardiovascular health medication. I have sent prescription for anastrozole, instructed her not to take it unless she talks to radiation and decides not to proceed with radiation.  If she decides to proceed with adjuvant  radiation, she may have to start this  pill after radiation. With regards to osteoporosis, we have discussed about bisphosphonates, risks of bisphosphonates including hypocalcemia, osteonecrosis of the jaw.  She does not have any teeth or dentures. She is instructed to take calcium and vitamin D supplementation for bone health along with bisphosphonates.  I will order bisphosphonates yearly since she will also be on anastrozole. She will return to clinic in 3 months.  All questions were answered. The patient knows to call the clinic with any problems, questions or concerns. I spent 30 minutes in the care of this patient including history, physical exam, review of records, counseling and coordination of care as mentioned above.    Benay Pike, MD 07/23/21

## 2021-07-23 ENCOUNTER — Encounter: Payer: Self-pay | Admitting: Hematology and Oncology

## 2021-07-23 ENCOUNTER — Encounter: Payer: Self-pay | Admitting: Radiation Oncology

## 2021-07-23 ENCOUNTER — Inpatient Hospital Stay (HOSPITAL_BASED_OUTPATIENT_CLINIC_OR_DEPARTMENT_OTHER): Payer: Medicare Other | Admitting: Hematology and Oncology

## 2021-07-23 ENCOUNTER — Other Ambulatory Visit: Payer: Self-pay

## 2021-07-23 DIAGNOSIS — K760 Fatty (change of) liver, not elsewhere classified: Secondary | ICD-10-CM | POA: Diagnosis not present

## 2021-07-23 DIAGNOSIS — Z9981 Dependence on supplemental oxygen: Secondary | ICD-10-CM | POA: Diagnosis not present

## 2021-07-23 DIAGNOSIS — Z170 Estrogen receptor positive status [ER+]: Secondary | ICD-10-CM | POA: Diagnosis not present

## 2021-07-23 DIAGNOSIS — M810 Age-related osteoporosis without current pathological fracture: Secondary | ICD-10-CM | POA: Diagnosis not present

## 2021-07-23 DIAGNOSIS — Z79899 Other long term (current) drug therapy: Secondary | ICD-10-CM | POA: Diagnosis not present

## 2021-07-23 DIAGNOSIS — D0512 Intraductal carcinoma in situ of left breast: Secondary | ICD-10-CM | POA: Diagnosis not present

## 2021-07-23 MED ORDER — ANASTROZOLE 1 MG PO TABS: 1.0000 mg | ORAL_TABLET | Freq: Every day | ORAL | 2 refills | Status: DC

## 2021-07-23 NOTE — Progress Notes (Incomplete)
Spoke w/ patient, verified identity, and began nurse interview by phone. Patient is doing well. No issues reported at this time.  Meaningful use complete. Postmenopausal- NO chances of pregnancy.  Reminded patient and she verbalized understanding of her 8:00am-08/01/21 in-person appointment w/ Shona Simpson PA-C.   Vitals pending appointment date.Marland KitchenMarland Kitchen

## 2021-08-01 ENCOUNTER — Ambulatory Visit: Payer: Medicare Other | Admitting: Radiation Oncology

## 2021-08-01 ENCOUNTER — Other Ambulatory Visit: Payer: Self-pay

## 2021-08-01 ENCOUNTER — Ambulatory Visit
Admission: RE | Admit: 2021-08-01 | Discharge: 2021-08-01 | Disposition: A | Discharge status: Home / Self Care | Payer: Medicare Other | Source: Ambulatory Visit | Attending: Radiation Oncology | Admitting: Radiation Oncology

## 2021-08-01 ENCOUNTER — Ambulatory Visit: Payer: Medicare Other

## 2021-08-01 ENCOUNTER — Encounter: Payer: Self-pay | Admitting: Radiation Oncology

## 2021-08-01 VITALS — BP 136/79 | HR 96 | Temp 97.8°F | Resp 20 | Ht 62.0 in | Wt 144.0 lb

## 2021-08-01 DIAGNOSIS — Z87891 Personal history of nicotine dependence: Secondary | ICD-10-CM | POA: Insufficient documentation

## 2021-08-01 DIAGNOSIS — Z170 Estrogen receptor positive status [ER+]: Secondary | ICD-10-CM | POA: Diagnosis not present

## 2021-08-01 DIAGNOSIS — D0512 Intraductal carcinoma in situ of left breast: Secondary | ICD-10-CM

## 2021-08-01 DIAGNOSIS — M810 Age-related osteoporosis without current pathological fracture: Secondary | ICD-10-CM | POA: Insufficient documentation

## 2021-08-01 DIAGNOSIS — J439 Emphysema, unspecified: Secondary | ICD-10-CM | POA: Insufficient documentation

## 2021-08-01 DIAGNOSIS — K219 Gastro-esophageal reflux disease without esophagitis: Secondary | ICD-10-CM | POA: Insufficient documentation

## 2021-08-01 DIAGNOSIS — J9621 Acute and chronic respiratory failure with hypoxia: Secondary | ICD-10-CM | POA: Insufficient documentation

## 2021-08-01 DIAGNOSIS — K760 Fatty (change of) liver, not elsewhere classified: Secondary | ICD-10-CM | POA: Diagnosis not present

## 2021-08-01 DIAGNOSIS — Z79899 Other long term (current) drug therapy: Secondary | ICD-10-CM | POA: Diagnosis not present

## 2021-08-01 DIAGNOSIS — E559 Vitamin D deficiency, unspecified: Secondary | ICD-10-CM | POA: Diagnosis not present

## 2021-08-01 DIAGNOSIS — E538 Deficiency of other specified B group vitamins: Secondary | ICD-10-CM | POA: Insufficient documentation

## 2021-08-01 DIAGNOSIS — F17210 Nicotine dependence, cigarettes, uncomplicated: Secondary | ICD-10-CM | POA: Insufficient documentation

## 2021-08-01 DIAGNOSIS — Z9981 Dependence on supplemental oxygen: Secondary | ICD-10-CM | POA: Diagnosis not present

## 2021-08-01 DIAGNOSIS — Z803 Family history of malignant neoplasm of breast: Secondary | ICD-10-CM | POA: Diagnosis not present

## 2021-08-01 DIAGNOSIS — Z8616 Personal history of COVID-19: Secondary | ICD-10-CM | POA: Diagnosis not present

## 2021-08-01 NOTE — Progress Notes (Signed)
Radiation Oncology         [phone] ________________________________  Initial Outpatient Consultation - Conducted via telephone due to current COVID-19 concerns for limiting patient exposure  I spoke with the patient to conduct this consult visit via telephone to spare the patient unnecessary potential exposure in the healthcare setting during the current COVID-19 pandemic. The patient was notified in advance and was offered a [place] meeting to allow for face to face communication but unfortunately reported that they did not have the appropriate resources/technology to support such a visit and instead preferred to proceed with a telephone consult.   Name: Abigail Castillo        MRN: [id number]  Date of Service: 08/01/2021 DOB: 1950-12-17  CC:[name], [name], NP  Benay Pike, MD     REFERRING PHYSICIAN: Benay Pike, MD   DIAGNOSIS: The encounter diagnosis was Ductal carcinoma in situ (DCIS) of left breast.   HISTORY OF PRESENT ILLNESS: Abigail Castillo is a 71 y.o. female seen at the request of Dr. Brantley Stage for a new diagnosis of left breast cancer. The patient was noted to have a CSL of the breast in 2017, and recent screening mammogram showed a 5 cm group of calcifications in the upper outer quadrant. There was a second group of calcifications but the main site was biopsied anteriorly and posteriorly and was consistent with high grade, ER/PR positive DCIS with calcifications and necrosis. She was offered lumpectomy versus mastectomy due to risks of breast volume loss but is motivated for breast conservation and getting set up for this. She's seen to discuss adjuvant radiotherapy.  Since her last visit, she underwent left lumpectomy with Dr. Brantley Stage on 07/03/21 that showed high grade DCIS measuring up to 6 mm, and additional margins were submitted and all were negative for disease. She's seen today to discuss adjuvant radiotherapy. She has also met again with Dr. Chryl Heck, and is  interested in forgoing radiation.    PREVIOUS RADIATION THERAPY: No   PAST MEDICAL HISTORY:  Past Medical History:  Diagnosis Date   Acute on chronic respiratory failure with hypoxia ([place]) 07/05/2019   Anxiety    Blood transfusion    Blood transfusion without reported diagnosis    Breast cancer ([place]) 05/13/2021   Chronic mental illness    COPD (chronic obstructive pulmonary disease) ([place])    COVID-19 virus infection 07/06/2019   DEPRESSION 05/31/2009   Emphysema of lung ([facility])    Esophageal stricture 03/14/2015   GERD (gastroesophageal reflux disease)    Hepatic steatosis    History of home oxygen therapy    History of pneumonia    Osteoporosis    Oxygen dependent    on O2 at 2-3L/min 24hr/day   Shortness of breath dyspnea    Tobacco abuse    ongoing   Vitamin B 12 deficiency    Vitamin D deficiency        PAST SURGICAL HISTORY: Past Surgical History:  Procedure Laterality Date   ABDOMINAL HYSTERECTOMY     BREAST EXCISIONAL BIOPSY Left 2017   BREAST LUMPECTOMY  1972   left breast   BREAST LUMPECTOMY WITH RADIOACTIVE SEED LOCALIZATION Left 11/22/2015   Procedure: LEFT BREAST LUMPECTOMY WITH RADIOACTIVE SEED LOCALIZATION;  Surgeon: Erroll Luna, MD;  Location: [facility];  Service: General;  Laterality: Left;   BREAST LUMPECTOMY WITH RADIOACTIVE SEED LOCALIZATION Left 07/03/2021   Procedure: LEFT BREAST LUMPECTOMY WITH RADIOACTIVE SEED LOCALIZATION;  Surgeon: Erroll Luna, MD;  Location: [facility];  Service: General;  Laterality:  Left;   BREAST SURGERY  ?2015   lumpectomy, left   CERVICAL CONE BIOPSY     CHOLECYSTECTOMY     COLONOSCOPY     ESOPHAGOGASTRODUODENOSCOPY (EGD) WITH ESOPHAGEAL DILATION     UPPER GASTROINTESTINAL ENDOSCOPY       FAMILY HISTORY:  Family History  Problem Relation Age of Onset   Coronary artery disease Mother    Diabetes Mother    Hypertension Mother    Coronary artery disease Father    Diabetes Father        Grandfather   Hypertension  Father    Stroke Father    Other Father        colostomy bag   Breast cancer Sister    Breast cancer Sister    Diabetes Sister    Breast cancer Sister    Hypertension Sister    Mental illness Sister        x 4   Cancer Other        niece--breast   Esophageal cancer Neg Hx    Stomach cancer Neg Hx    Rectal cancer Neg Hx    Colon cancer Neg Hx    Colon polyps Neg Hx      SOCIAL HISTORY:  reports that she quit smoking about 9 years ago. Her smoking use included cigarettes. She has a 40.00 pack-year smoking history. She has never used smokeless tobacco. She reports that she does not drink alcohol and does not use drugs. The patient is divorced and lives in [place]. She's accompanied by her daughter.   ALLERGIES: Sulfonamide derivatives   MEDICATIONS:  Current Outpatient Medications  Medication Sig Dispense Refill   albuterol (VENTOLIN HFA) 108 (90 Base) MCG/ACT inhaler INHALE TWO PUFFS BY MOUTH INTO THE LUNGS EVERY 6 HOURS AS NEEDED FOR WHEEZING OR SHORTNESS OF BREATH 6.7 g 5   Calcium Carbonate-Vitamin D 600-400 MG-UNIT tablet Take 1 tablet by mouth 2 (two) times daily.     cholecalciferol (VITAMIN D) 1000 UNITS tablet Take 3,000 Units by mouth daily.      DEXILANT 60 MG capsule TAKE ONE CAPSULE BY MOUTH DAILY 90 capsule 1   ibuprofen (ADVIL) 800 MG tablet Take 1 tablet (800 mg total) by mouth every 8 (eight) hours as needed. 30 tablet 0   LORazepam (ATIVAN) 1 MG tablet Take 1 mg by mouth 2 (two) times daily as needed for anxiety.      OLANZapine (ZYPREXA) 5 MG tablet Take 7.5 mg by mouth daily.      OXYGEN Inhale 3 L into the lungs.     Spacer/Aero-Holding Chambers (AEROCHAMBER MV) inhaler Use as instructed 1 each 0   Tiotropium Bromide-Olodaterol (STIOLTO RESPIMAT) 2.5-2.5 MCG/ACT AERS INHALE TWO PUFFS INTO THE LUNGS DAILY 4 g 4   vitamin B-12 (CYANOCOBALAMIN) 1000 MCG tablet TAKE ONE TABLET BY MOUTH DAILY 30 tablet 3   vitamin E 180 MG (400 UNITS) capsule Take 400 Units  by mouth daily.     anastrozole (ARIMIDEX) 1 MG tablet Take 1 tablet (1 mg total) by mouth daily. (Patient not taking: Reported on 08/01/2021) 30 tablet 2   traMADol (ULTRAM) 50 MG tablet Take 1 tablet (50 mg total) by mouth every 6 (six) hours as needed. (Patient not taking: Reported on 08/01/2021) 20 tablet 0   No current facility-administered medications for this encounter.     REVIEW OF SYSTEMS: On review of systems, the patient reports that she is doing okay. Her ex-husband recently passed away and she  and her daughter are trying to coordinate his funeral. She reports her breathing continues to be fair and she is still dependant on 3L of oxygen continuously. She continues to note a struggle  to get to the doctors office. She states her breast is healing well. No other complaints are noted.      PHYSICAL EXAM:  Wt Readings from Last 3 Encounters:  07/23/21 144 lb (65.3 kg)  07/03/21 140 lb (63.5 kg)  07/02/21 146 lb 3.2 oz (66.3 kg)   Temp Readings from Last 3 Encounters:  07/23/21 97.8 F (36.6 C)  07/03/21 97.9 F (36.6 C)  07/02/21 97.9 F (36.6 C) (Temporal)   BP Readings from Last 3 Encounters:  07/23/21 136/79  07/03/21 (!) 126/96  07/02/21 (!) 157/94   Pulse Readings from Last 3 Encounters:  07/23/21 96  07/03/21 86  07/02/21 (!) 103   In general this is a chronically ill appearing caucasian female in no acute distress. She's alert and oriented x4 and appropriate throughout the examination. Cardiopulmonary assessment is negative for acute distress and she exhibits normal effort.     ECOG = 2  0 - Asymptomatic (Fully active, able to carry on all predisease activities without restriction)  1 - Symptomatic but completely ambulatory (Restricted in physically strenuous activity but ambulatory and able to carry out work of a light or sedentary nature. For example, light housework, office work)  2 - Symptomatic, <50% in bed during the day (Ambulatory and capable of all  self care but unable to carry out any work activities. Up and about more than 50% of waking hours)  3 - Symptomatic, >50% in bed, but not bedbound (Capable of only limited self-care, confined to bed or chair 50% or more of waking hours)  4 - Bedbound (Completely disabled. Cannot carry on any self-care. Totally confined to bed or chair)  5 - Death   Eustace Pen MM, Creech RH, Tormey DC, et al. [phone]). "Toxicity and response criteria of the [facility]". [place] Oncol. 5 (6): 649-55    LABORATORY DATA:  Lab Results  Component Value Date   WBC 5.5 07/03/2021   HGB 13.4 07/03/2021   HCT 40.6 07/03/2021   MCV 97.4 07/03/2021   PLT 194 07/03/2021   Lab Results  Component Value Date   NA 140 07/02/2021   K 4.5 07/02/2021   CL 103 07/02/2021   CO2 27 07/02/2021   Lab Results  Component Value Date   ALT 38 (H) 02/22/2021   AST 43 (H) 02/22/2021   ALKPHOS 109 02/22/2021   BILITOT 1.7 (H) 02/22/2021      RADIOGRAPHY: MM Breast Surgical Specimen  Result Date: 07/03/2021 CLINICAL DATA:  Evaluate specimen EXAM: SPECIMEN RADIOGRAPH OF THE LEFT BREAST COMPARISON:  Previous exam(s). FINDINGS: Status post excision of the left breast. The radioactive seeds and biopsy marking clips are present, completely intact, and were marked for pathology. IMPRESSION: Specimen radiograph of the left breast. Electronically Signed   By: Dorise Bullion III M.D.   On: 07/03/2021 09:18  MM LT RADIOACTIVE SEED LOC MAMMO GUIDE  Result Date: 07/02/2021 CLINICAL DATA:  Localization prior to surgery EXAM: MAMMOGRAPHIC GUIDED RADIOACTIVE SEED LOCALIZATION OF THE LEFT BREAST COMPARISON:  Previous exam(s). FINDINGS: Patient presents for radioactive seed localization prior to surgery. I met with the patient and we discussed the procedure of seed localization including benefits and alternatives. We discussed the high likelihood of a successful procedure. We discussed the risks of the procedure  including infection, bleeding, tissue injury and further surgery. We discussed the low dose of radioactivity involved in the procedure. Informed, written consent was given. The usual time-out protocol was performed immediately prior to the procedure. Using mammographic guidance, sterile technique, 1% lidocaine and an I-125 radioactive seed, the posterior extent of calcifications was localized using a superior approach. The follow-up mammogram images confirm the seed in the expected location and were marked for the surgeon. Follow-up survey of the patient confirms presence of the radioactive seed. Order number of I-125 seed:  [id number]. Total activity:  6.767 millicuries reference Date: May 08, 2021 Using mammographic guidance, sterile technique, 1% lidocaine and an I-125 radioactive seed, the anterior extent of calcifications was localized using a superior approach. The follow-up mammogram images confirm the seed in the expected location and were marked for a surgeon. Follow-up survey of the patient confirms presence of the radioactive seed. Order number of I-125 seed:  [id number]. Total activity:  8.366 millicuries reference Date: July 01, 2021 The patient tolerated the procedure well and was released from the [place] [facility]. She was given instructions regarding seed removal. IMPRESSION: Radioactive seed localization 2 sites in the left breast breast. No apparent complications. Electronically Signed   By: Dorise Bullion III M.D.   On: 07/02/2021 14:14  MM LT RAD SEED EA ADD LESION LOC MAMMO  Result Date: 07/02/2021 CLINICAL DATA:  Localization prior to surgery EXAM: MAMMOGRAPHIC GUIDED RADIOACTIVE SEED LOCALIZATION OF THE LEFT BREAST COMPARISON:  Previous exam(s). FINDINGS: Patient presents for radioactive seed localization prior to surgery. I met with the patient and we discussed the procedure of seed localization including benefits and alternatives. We discussed the high likelihood of a successful  procedure. We discussed the risks of the procedure including infection, bleeding, tissue injury and further surgery. We discussed the low dose of radioactivity involved in the procedure. Informed, written consent was given. The usual time-out protocol was performed immediately prior to the procedure. Using mammographic guidance, sterile technique, 1% lidocaine and an I-125 radioactive seed, the posterior extent of calcifications was localized using a superior approach. The follow-up mammogram images confirm the seed in the expected location and were marked for the surgeon. Follow-up survey of the patient confirms presence of the radioactive seed. Order number of I-125 seed:  [id number]. Total activity:  0.354 millicuries reference Date: May 08, 2021 Using mammographic guidance, sterile technique, 1% lidocaine and an I-125 radioactive seed, the anterior extent of calcifications was localized using a superior approach. The follow-up mammogram images confirm the seed in the expected location and were marked for a surgeon. Follow-up survey of the patient confirms presence of the radioactive seed. Order number of I-125 seed:  [id number]. Total activity:  7.001 millicuries reference Date: July 01, 2021 The patient tolerated the procedure well and was released from the [facility]. She was given instructions regarding seed removal. IMPRESSION: Radioactive seed localization 2 sites in the left breast breast. No apparent complications. Electronically Signed   By: Dorise Bullion III M.D.   On: 07/02/2021 14:14      IMPRESSION/PLAN: 1.         High grade, ER/PR positive DCIS of the left breast. Dr. Lisbeth Renshaw and I have reviewed her case. Today I discussed the pathology findings and reviews the nature of noninvasive breast disease. We reviewed the rational to proceed with external radiotherapy to the left breast with deep inspiration breath hold technique for 4 week to reduce risks of local recurrence followed by  antiestrogen therapy. We discussed  the risks, benefits, short, and long term effects of radiotherapy, as well as the curative intent, and the patient and her daughter are concerned about the ability for her logistically to get to treatment given the distance they live to our office and her lack of mobility. We reviewed the DCIS Nomogram from [place] and reviewed scenarios of risk without any adjuvant therapy, as well as with each radiation or antiestrogen, and both. She is aware of the risk reductions seen with each of the interventions. We discussed that she does not quite fit the situation to forgo radiation and that guidelines would still support the use of radiation, but she prefers to forgo therapy given concerns her COPD may become more life threatening sooner than risk of breast cancer recurrence. We will follow up with her as needed and she plans to begin antiestrogen under Dr. Rob Hickman care within the next week. 2.         O2 dependant COPD. She will continue with Dr. Lamonte Sakai in pulmonary medicine.   In a visit lasting 45 minutes, greater than 50% of the time was spent face to face discussing the patient's condition, in preparation for the discussion, and coordinating the patient's care.      Carola Rhine, [facility]    **Disclaimer: This note was dictated with voice recognition software. Similar sounding words can inadvertently be transcribed and this note may contain transcription errors which may not have been corrected upon publication of note.**

## 2021-08-01 NOTE — Progress Notes (Signed)
Patient is here for a follow-up new appointment today.Patient states that she has not taken radiation before. Patient denies having a Psychologist, forensic. ?Vitals:  ? 07/23/21 1342  ?BP: 136/79  ?Pulse: 96  ?Resp: 20  ?Temp: 97.8 ?F (36.6 ?C)  ?SpO2: 97%  ?Weight: 65.3 kg  ?Height: '5\' 2"'$  (1.575 m)  ? ? ?

## 2021-08-08 ENCOUNTER — Encounter (HOSPITAL_COMMUNITY): Payer: Self-pay

## 2021-08-08 ENCOUNTER — Encounter: Payer: Self-pay | Admitting: *Deleted

## 2021-08-14 ENCOUNTER — Other Ambulatory Visit: Payer: Self-pay | Admitting: Family

## 2021-09-03 ENCOUNTER — Other Ambulatory Visit: Payer: Self-pay | Admitting: Emergency Medicine

## 2021-09-13 ENCOUNTER — Telehealth: Payer: Self-pay | Admitting: *Deleted

## 2021-09-19 ENCOUNTER — Telehealth: Payer: Self-pay | Admitting: *Deleted

## 2021-09-19 NOTE — Telephone Encounter (Signed)
Per 4/27 in basket called and spoke to pt about appointment.  Pt confirmed appointment  ?

## 2021-10-02 ENCOUNTER — Other Ambulatory Visit: Payer: Self-pay | Admitting: Hematology and Oncology

## 2021-10-22 ENCOUNTER — Other Ambulatory Visit: Payer: Self-pay

## 2021-10-22 ENCOUNTER — Inpatient Hospital Stay (HOSPITAL_BASED_OUTPATIENT_CLINIC_OR_DEPARTMENT_OTHER): Payer: Medicare Other | Admitting: *Deleted

## 2021-10-22 ENCOUNTER — Encounter: Payer: Self-pay | Admitting: *Deleted

## 2021-10-22 ENCOUNTER — Inpatient Hospital Stay: Payer: Medicare Other | Attending: Hematology and Oncology | Admitting: Hematology and Oncology

## 2021-10-22 ENCOUNTER — Encounter: Payer: Self-pay | Admitting: Hematology and Oncology

## 2021-10-22 VITALS — BP 124/78 | HR 94 | Temp 97.8°F | Resp 16 | Ht 62.0 in | Wt 143.0 lb

## 2021-10-22 VITALS — BP 124/78 | Ht 62.0 in | Wt 143.0 lb

## 2021-10-22 DIAGNOSIS — M818 Other osteoporosis without current pathological fracture: Secondary | ICD-10-CM | POA: Diagnosis not present

## 2021-10-22 DIAGNOSIS — Z79811 Long term (current) use of aromatase inhibitors: Secondary | ICD-10-CM | POA: Diagnosis not present

## 2021-10-22 DIAGNOSIS — M810 Age-related osteoporosis without current pathological fracture: Secondary | ICD-10-CM

## 2021-10-22 DIAGNOSIS — D0512 Intraductal carcinoma in situ of left breast: Secondary | ICD-10-CM | POA: Diagnosis not present

## 2021-10-22 DIAGNOSIS — Z87891 Personal history of nicotine dependence: Secondary | ICD-10-CM | POA: Diagnosis not present

## 2021-10-22 DIAGNOSIS — Z9981 Dependence on supplemental oxygen: Secondary | ICD-10-CM | POA: Insufficient documentation

## 2021-10-22 MED ORDER — ANASTROZOLE 1 MG PO TABS: 1.0000 mg | ORAL_TABLET | Freq: Every day | ORAL | 3 refills | Status: DC

## 2021-10-22 NOTE — Progress Notes (Signed)
[place] CONSULT NOTE  Patient Care Team: [name], NP as PCP - General (Internal Medicine) Gatha Mayer, MD as Consulting Physician (Gastroenterology) Lendon Colonel, MD as Referring Physician (Psychiatry) Erroll Luna, MD as Consulting Physician (General Surgery) Hayden Pedro, PA-C as Physician Assistant (Radiation Oncology) Hayden Pedro, PA-C as Physician Assistant (Radiation Oncology) Delice Bison, Charlestine Massed, NP as Nurse Practitioner (Hematology and Oncology) Benay Pike, MD as Consulting Physician (Hematology and Oncology) Harmon Pier, RN as Registered Nurse  CHIEF COMPLAINTS/PURPOSE OF CONSULTATION:  Newly diagnosed breast cancer  HISTORY OF PRESENTING ILLNESS:  Abigail Castillo 71 y.o. female is here because of recent diagnosis of left breast DCIS  This is a 71 year old postmenopausal female patient who presented with abnormal screening mammogram.  Mammogram showed a 5 cm area of distortion and calcifications in the left breast and core biopsy showed DCIS high-grade with comedonecrosis.   Prognostics showed ER +100% strong staining intensity PR 40% positive strong staining intensity she was seen by Dr. Brantley Stage from breast surgery for further recommendations. She is scheduled for surgery tomorrow.  She is now referred to medical oncology and radiation oncology for adjuvant recommendations.  Past medical history significant for COPD, oxygen dependency, osteoporosis. She does have a diagnosis of osteoporosis which is not yet treated. Since last visit, she had left breast lumpectomy which showed DCIS, high-grade, 0.6 cm, margins uninvolved by carcinoma, previous biopsy site changes, small incidental intraductal papilloma.  Prior prognostic showed ER positive, 100% strong PR positive, 40% strong  Interval history She is healing well from her surgery.  No complaints. She denied adjuvant radiation. She started anastrazole about a  month ago. No adverse effects so far. Surgery site healed well.  I reviewed her records extensively and collaborated the history with the patient.  SUMMARY OF ONCOLOGIC HISTORY: Oncology History   No history exists.     MEDICAL HISTORY:  Past Medical History:  Diagnosis Date   Acute on chronic respiratory failure with hypoxia ([place]) 07/05/2019   Anxiety    Blood transfusion    Blood transfusion without reported diagnosis    Breast cancer ([place]) 05/13/2021   Chronic mental illness    COPD (chronic obstructive pulmonary disease) ([employer])    COVID-19 virus infection 07/06/2019   DEPRESSION 05/31/2009   Emphysema of lung ([place])    Esophageal stricture 03/14/2015   GERD (gastroesophageal reflux disease)    Hepatic steatosis    History of home oxygen therapy    History of pneumonia    Osteoporosis    Oxygen dependent    on O2 at 2-3L/min 24hr/day   Shortness of breath dyspnea    Tobacco abuse    ongoing   Vitamin B 12 deficiency    Vitamin D deficiency     SURGICAL HISTORY: Past Surgical History:  Procedure Laterality Date   ABDOMINAL HYSTERECTOMY     BREAST EXCISIONAL BIOPSY Left 2017   BREAST LUMPECTOMY  1972   left breast   BREAST LUMPECTOMY WITH RADIOACTIVE SEED LOCALIZATION Left 11/22/2015   Procedure: LEFT BREAST LUMPECTOMY WITH RADIOACTIVE SEED LOCALIZATION;  Surgeon: Erroll Luna, MD;  Location: [facility];  Service: General;  Laterality: Left;   BREAST LUMPECTOMY WITH RADIOACTIVE SEED LOCALIZATION Left 07/03/2021   Procedure: LEFT BREAST LUMPECTOMY WITH RADIOACTIVE SEED LOCALIZATION;  Surgeon: Erroll Luna, MD;  Location: [facility];  Service: General;  Laterality: Left;   BREAST SURGERY  ?2015   lumpectomy, left   CERVICAL CONE BIOPSY     CHOLECYSTECTOMY  COLONOSCOPY     ESOPHAGOGASTRODUODENOSCOPY (EGD) WITH ESOPHAGEAL DILATION     UPPER GASTROINTESTINAL ENDOSCOPY      SOCIAL HISTORY: Social History   Socioeconomic History   Marital status: Divorced    Spouse  name: Not on file   Number of children: 3   Years of education: Not on file   Highest education level: Not on file  Occupational History   Occupation: disabled    Employer: UNEMPLOYED  Tobacco Use   Smoking status: Former    Packs/day: 1.00    Years: 40.00    Pack years: 40.00    Types: Cigarettes    Quit date: 08/12/2011    Years since quitting: 10.2   Smokeless tobacco: Never  Vaping Use   Vaping Use: Never used  Substance and Sexual Activity   Alcohol use: No    Alcohol/week: 0.0 standard drinks   Drug use: No   Sexual activity: Never  Other Topics Concern   Not on file  Social History Narrative   Lost a son to Lung CA 1/18   Lives with a son in [place]   Has a daughter who lives locally   Social Determinants of Health   Financial Resource Strain: Low Risk    Difficulty of Paying Living Expenses: Not hard at all  Food Insecurity: No Food Insecurity   Worried About Charity fundraiser in the Last Year: Never true   Arboriculturist in the Last Year: Never true  Transportation Needs: No Transportation Needs   Lack of Transportation (Medical): No   Lack of Transportation (Non-Medical): No  Physical Activity: Inactive   Days of Exercise per Week: 0 days   Minutes of Exercise per Session: 0 min  Stress: No Stress Concern Present   Feeling of Stress : Not at all  Social Connections: Moderately Isolated   Frequency of Communication with Friends and Family: More than three times a week   Frequency of Social Gatherings with Friends and Family: More than three times a week   Attends Religious Services: More than 4 times per year   Active Member of [employer] or Organizations: No   Attends Music therapist: Never   Marital Status: Divorced  Human resources officer Violence: Not At Risk   Fear of Current or Ex-Partner: No   Emotionally Abused: No   Physically Abused: No   Sexually Abused: No    FAMILY HISTORY: Family History  Problem Relation Age of Onset    Coronary artery disease Mother    Diabetes Mother    Hypertension Mother    Coronary artery disease Father    Diabetes Father        Grandfather   Hypertension Father    Stroke Father    Other Father        colostomy bag   Breast cancer Sister    Breast cancer Sister    Diabetes Sister    Breast cancer Sister    Hypertension Sister    Mental illness Sister        x 4   Cancer Other        niece--breast   Esophageal cancer Neg Hx    Stomach cancer Neg Hx    Rectal cancer Neg Hx    Colon cancer Neg Hx    Colon polyps Neg Hx     ALLERGIES:  is allergic to sulfonamide derivatives.  MEDICATIONS:  Current Outpatient Medications  Medication Sig Dispense Refill   albuterol (VENTOLIN  HFA) 108 (90 Base) MCG/ACT inhaler INHALE TWO PUFFS BY MOUTH INTO THE LUNGS EVERY 6 HOURS AS NEEDED FOR WHEEZING OR SHORTNESS OF BREATH 8.5 g 2   anastrozole (ARIMIDEX) 1 MG tablet TAKE ONE TABLET BY MOUTH DAILY 30 tablet 1   Calcium Carbonate-Vitamin D 600-400 MG-UNIT tablet Take 1 tablet by mouth 2 (two) times daily.     cholecalciferol (VITAMIN D) 1000 UNITS tablet Take 3,000 Units by mouth daily.      dexlansoprazole (DEXILANT) 60 MG capsule TAKE ONE CAPSULE BY MOUTH DAILY 90 capsule 0   ibuprofen (ADVIL) 800 MG tablet Take 1 tablet (800 mg total) by mouth every 8 (eight) hours as needed. 30 tablet 0   LORazepam (ATIVAN) 1 MG tablet Take 1 mg by mouth 2 (two) times daily as needed for anxiety.      OLANZapine (ZYPREXA) 5 MG tablet Take 7.5 mg by mouth daily.      OXYGEN Inhale 3 L into the lungs.     Spacer/Aero-Holding Chambers (AEROCHAMBER MV) inhaler Use as instructed 1 each 0   Tiotropium Bromide-Olodaterol (STIOLTO RESPIMAT) 2.5-2.5 MCG/ACT AERS INHALE TWO PUFFS INTO THE LUNGS DAILY 4 g 4   traMADol (ULTRAM) 50 MG tablet Take 1 tablet (50 mg total) by mouth every 6 (six) hours as needed. (Patient not taking: Reported on 08/01/2021) 20 tablet 0   vitamin B-12 (CYANOCOBALAMIN) 1000 MCG tablet TAKE  ONE TABLET BY MOUTH DAILY 30 tablet 3   vitamin E 180 MG (400 UNITS) capsule Take 400 Units by mouth daily.     No current facility-administered medications for this visit.    PHYSICAL EXAMINATION: ECOG PERFORMANCE STATUS: 0 - Asymptomatic  Vitals:   10/22/21 0901  BP: 124/78  Pulse: 94  Resp: 16  Temp: 97.8 F (36.6 C)  SpO2: 99%    Filed Weights   10/22/21 0901  Weight: 143 lb (64.9 kg)    Physical Exam Constitutional:      Appearance: Normal appearance.  Cardiovascular:     Rate and Rhythm: Normal rate and regular rhythm.  Pulmonary:     Breath sounds: Normal breath sounds.  Musculoskeletal:        General: No swelling.     Cervical back: Normal range of motion and neck supple. No rigidity.  Lymphadenopathy:     Cervical: No cervical adenopathy.  Neurological:     Mental Status: She is alert.     LABORATORY DATA:  I have reviewed the data as listed Lab Results  Component Value Date   WBC 5.5 07/03/2021   HGB 13.4 07/03/2021   HCT 40.6 07/03/2021   MCV 97.4 07/03/2021   PLT 194 07/03/2021   Lab Results  Component Value Date   NA 140 07/02/2021   K 4.5 07/02/2021   CL 103 07/02/2021   CO2 27 07/02/2021    RADIOGRAPHIC STUDIES: I have personally reviewed the radiological reports and agreed with the findings in the report.  ASSESSMENT AND PLAN:   This is a pleasant 71 year old female patient with left breast DCIS, high-grade, with comedonecrosis and ER/PR positive is underwent lumpectomy and pathology showed 6 mm of high-grade DCIS.  Prior prognostics ER 100% positive strong staining, PR 40% positive strong staining. She is now here for follow up on anastrazole. She is tolerating the anastrazole very well. With regards to osteoporosis, she will follow up with Dr Inda Castle for management of osteoporosis. In the mean time, she will continue Vit D supplementation, calcium as tolerated. Surgical site  healed well. She will return to clinic in 3/4  months.  All questions were answered. The patient knows to call the clinic with any problems, questions or concerns. I spent 20 minutes in the care of this patient including history, physical exam, review of records, counseling and coordination of care as mentioned above.    Benay Pike, MD 10/22/21

## 2021-10-22 NOTE — Progress Notes (Signed)
   Pt came today for Survivorship Care Plan appointment. Pt denies pain today but does have fatigue and rates it 5/10. Vital signs are WNL. Pt is in wheelchair today and using 2.5 L of oxygen. Pt has an hx of COPD. Allergies and medication were updated. Pt states she is tolerating taking anastrozole without any problems so far. Pt does not exercise regularly because of her limited mobility with COPD. I did mention to her about trying chair yoga.   Last colonoscopy was 2013. I recommended that she call [facility] and schedule to have colonoscopy this year. Pt will see PCP this year for annual check-up. We discussed "Nutrition [employer]" and healthier eating choices. Pt will return to see Dr. Chryl Heck in September. SCP reviewed and completed .

## 2021-10-25 ENCOUNTER — Telehealth: Payer: Self-pay | Admitting: Emergency Medicine

## 2021-10-25 MED ORDER — STIOLTO RESPIMAT 2.5-2.5 MCG/ACT IN AERS: INHALATION_SPRAY | RESPIRATORY_TRACT | 2 refills | Status: DC

## 2021-10-25 NOTE — Telephone Encounter (Signed)
ATC patient about refills for Stiolto. Left detailed message that refill was sent to get her through to her OV that is scheduled. Nothing further need at this time.

## 2021-10-25 NOTE — Telephone Encounter (Signed)
Patient states she does not have any more refills on Stiolto inhaler. Uses [facility].  Please advise.

## 2021-10-28 ENCOUNTER — Telehealth: Payer: Self-pay | Admitting: Family

## 2021-10-28 ENCOUNTER — Ambulatory Visit (INDEPENDENT_AMBULATORY_CARE_PROVIDER_SITE_OTHER): Payer: Medicare Other | Admitting: Family

## 2021-10-28 VITALS — BP 143/93 | HR 93 | Temp 97.8°F | Resp 16 | Wt 144.0 lb

## 2021-10-28 DIAGNOSIS — M810 Age-related osteoporosis without current pathological fracture: Secondary | ICD-10-CM

## 2021-10-28 DIAGNOSIS — E785 Hyperlipidemia, unspecified: Secondary | ICD-10-CM | POA: Diagnosis not present

## 2021-10-28 DIAGNOSIS — K219 Gastro-esophageal reflux disease without esophagitis: Secondary | ICD-10-CM

## 2021-10-28 DIAGNOSIS — E559 Vitamin D deficiency, unspecified: Secondary | ICD-10-CM

## 2021-10-28 DIAGNOSIS — E781 Pure hyperglyceridemia: Secondary | ICD-10-CM | POA: Diagnosis not present

## 2021-10-28 DIAGNOSIS — J449 Chronic obstructive pulmonary disease, unspecified: Secondary | ICD-10-CM | POA: Diagnosis not present

## 2021-10-28 DIAGNOSIS — D0512 Intraductal carcinoma in situ of left breast: Secondary | ICD-10-CM | POA: Diagnosis not present

## 2021-10-28 DIAGNOSIS — R739 Hyperglycemia, unspecified: Secondary | ICD-10-CM | POA: Diagnosis not present

## 2021-10-28 DIAGNOSIS — F32A Depression, unspecified: Secondary | ICD-10-CM

## 2021-10-28 DIAGNOSIS — Z1211 Encounter for screening for malignant neoplasm of colon: Secondary | ICD-10-CM

## 2021-10-28 DIAGNOSIS — F259 Schizoaffective disorder, unspecified: Secondary | ICD-10-CM

## 2021-10-28 DIAGNOSIS — E538 Deficiency of other specified B group vitamins: Secondary | ICD-10-CM | POA: Diagnosis not present

## 2021-10-28 LAB — LIPID PANEL
Cholesterol: 232 mg/dL — ABNORMAL HIGH (ref 0–200)
HDL: 52.9 mg/dL (ref >39.00)
LDL Cholesterol: 158 mg/dL — ABNORMAL HIGH (ref 0–99)
NonHDL: 178.75
Total CHOL/HDL Ratio: 4
Triglycerides: 103 mg/dL (ref 0.0–149.0)
VLDL: 20.6 mg/dL (ref 0.0–40.0)

## 2021-10-28 LAB — BASIC METABOLIC PANEL
BUN: 8 mg/dL (ref 6–23)
CO2: 32 mEq/L (ref 19–32)
Calcium: 9.5 mg/dL (ref 8.4–10.5)
Chloride: 102 mEq/L (ref 96–112)
Creatinine, Ser: 0.79 mg/dL (ref 0.40–1.20)
GFR: 76.23 mL/min (ref >60.00)
Glucose, Bld: 85 mg/dL (ref 70–99)
Potassium: 4.4 mEq/L (ref 3.5–5.1)
Sodium: 141 mEq/L (ref 135–145)

## 2021-10-28 LAB — HEMOGLOBIN A1C: Hgb A1c MFr Bld: 5.3 % (ref 4.6–6.5)

## 2021-10-28 LAB — VITAMIN D 25 HYDROXY (VIT D DEFICIENCY, FRACTURES): VITD: 81.64 ng/mL (ref 30.00–100.00)

## 2021-10-28 MED ORDER — VITAMIN B-12 1000 MCG PO TABS: 1000.0000 ug | ORAL_TABLET | Freq: Every day | ORAL | 1 refills | Status: DC

## 2021-10-28 MED ORDER — DEXLANSOPRAZOLE 60 MG PO CPDR: 1.0000 | DELAYED_RELEASE_CAPSULE | Freq: Every day | ORAL | 1 refills | Status: DC

## 2021-10-28 MED ORDER — TETANUS-DIPHTHERIA TOXOIDS TD 2-2 LF/0.5ML IM SUSP: 0.5000 mL | Freq: Once | INTRAMUSCULAR | 0 refills | Status: AC

## 2021-10-28 NOTE — Assessment & Plan Note (Signed)
She would like to initiate Prolia.

## 2021-10-28 NOTE — Telephone Encounter (Signed)
Pt is interested in prolia injection. Please work on [facility]. Daughter wishes to be the point of contact for scheduling.  Thanks.

## 2021-10-28 NOTE — Assessment & Plan Note (Signed)
Stable, management per psychiatry.

## 2021-10-28 NOTE — Assessment & Plan Note (Signed)
Continue oral b12.

## 2021-10-28 NOTE — Assessment & Plan Note (Signed)
Management per oncology. Has upcoming appointment.

## 2021-10-28 NOTE — Progress Notes (Signed)
Subjective:   By signing my name below, I, Abigail Castillo, attest that this documentation has been prepared under the direction and in the presence ofO'Sullivan, [name], NP 10/28/2021    Patient ID: Abigail Castillo, female    DOB: [birth year], 71 y.o.   MRN: [id number]  Chief Complaint  Patient presents with   B12 deficiency    Here for follow up   COPD    History of COPD   Osteoporosis    Will like to discuss options    COPD Her past medical history is significant for COPD.  Patient is in today for a follow up visit. She is present with her daughter during this visit.   Breast cancer- She continues seeing her oncologist regularly to follow up on her breast cancer.   Mood- She continues seeing her psychiatrist, Dr. Erling Cruz, to manage her mood.   Breathing- She continues using 2-3 liters supplemental oxygen daily and reports doing well while on it. She continues using albuterol inhaler and reports using it 2x daily to manage her breathing. She continues using stiolta inhaler 2x daily and reports doing well while taking it.   Colonoscopy- She is due for a colonoscopy and is interested in scheduling an appointment.   Immunizations- She is due for a her tetanus vaccine and is interested in receiving it at her pharmacy. She is due for a shingles vaccine and is not interested in receiving it.   Osteoporosis- Her daughter reports her mother is not taking anything to manage her osteoporosis. Patient is interested in prolia injections to manage her osteoporosis. She is currently on estrogen supplements at this time.   Blood sugar- Her daughter reports her blood sugar was elevated during her last lab work. She notes her mother typically eats sweets often. She is interested in checking her blood sugar during her lab work this visit.  Lab Results  Component Value Date   HGBA1C 5.4 02/03/2014   Cholesterol- She last check her cholesterol last year. She is interested in checking her cholesterol  during her lab work this visit.  Lab Results  Component Value Date   CHOL 200 08/21/2020   HDL 38.70 (L) 08/21/2020   LDLCALC 132 (H) 08/21/2020   TRIG 145.0 08/21/2020   CHOLHDL 5 08/21/2020    Health Maintenance Due  Topic Date Due   COLONOSCOPY (Pts 45-74yr Insurance coverage will need to be confirmed)  02/11/2017   TETANUS/TDAP  05/26/2018    Past Medical History:  Diagnosis Date   [place]   Anxiety    Blood transfusion    Blood transfusion without reported diagnosis    [place] 05/13/2021   Chronic mental illness    [place]    COVID-19 virus infection 07/06/2019   DEPRESSION 05/31/2009   Emphysema of lung ([place]    Esophageal stricture 03/14/2015   GERD (gastroesophageal reflux disease)    Hepatic steatosis    History of home oxygen therapy    History of pneumonia    Osteoporosis    Oxygen dependent    on O2 at 2-3L/min 24hr/day   Shortness of breath dyspnea    Tobacco abuse    ongoing   Vitamin B 12 deficiency    Vitamin D deficiency     Past Surgical History:  Procedure Laterality Date   ABDOMINAL HYSTERECTOMY     BREAST EXCISIONAL BIOPSY Left 2017   BREAST LUMPECTOMY  1972  left breast   BREAST LUMPECTOMY WITH RADIOACTIVE SEED LOCALIZATION Left 11/22/2015   Procedure: LEFT BREAST LUMPECTOMY WITH RADIOACTIVE SEED LOCALIZATION;  Surgeon: Erroll Luna, MD;  Location: [facility];  Service: General;  Laterality: Left;   BREAST LUMPECTOMY WITH RADIOACTIVE SEED LOCALIZATION Left 07/03/2021   Procedure: LEFT BREAST LUMPECTOMY WITH RADIOACTIVE SEED LOCALIZATION;  Surgeon: Erroll Luna, MD;  Location: [facility];  Service: General;  Laterality: Left;   BREAST SURGERY  ?2015   lumpectomy, left   CERVICAL CONE BIOPSY     CHOLECYSTECTOMY     COLONOSCOPY     ESOPHAGOGASTRODUODENOSCOPY (EGD) WITH ESOPHAGEAL DILATION     UPPER GASTROINTESTINAL ENDOSCOPY       Family History  Problem Relation Age of Onset   Coronary artery disease Mother    Diabetes Mother    Hypertension Mother    Coronary artery disease Father    Diabetes Father        Grandfather   Hypertension Father    Stroke Father    Other Father        colostomy bag   Breast cancer Sister    Breast cancer Sister    Diabetes Sister    Breast cancer Sister    Hypertension Sister    Mental illness Sister        x 4   Cancer Other        niece--breast   Esophageal cancer Neg Hx    Stomach cancer Neg Hx    Rectal cancer Neg Hx    Colon cancer Neg Hx    Colon polyps Neg Hx     Social History   Socioeconomic History   Marital status: Divorced    Spouse name: Not on file   Number of children: 3   Years of education: Not on file   Highest education level: Not on file  Occupational History   Occupation: disabled    Employer: UNEMPLOYED  Tobacco Use   Smoking status: Former    Packs/day: 1.00    Years: 40.00    Pack years: 40.00    Types: Cigarettes    Quit date: 08/12/2011    Years since quitting: 10.2   Smokeless tobacco: Never  Vaping Use   Vaping Use: Never used  Substance and Sexual Activity   Alcohol use: No    Alcohol/week: 0.0 standard drinks   Drug use: No   Sexual activity: Never  Other Topics Concern   Not on file  Social History Narrative   Lost a son to Lung CA 1/18   Lives with a son in [place]   Has a daughter who lives locally   Social Determinants of Health   Financial Resource Strain: Low Risk    Difficulty of Paying Living Expenses: Not hard at all  Food Insecurity: No Food Insecurity   Worried About Charity fundraiser in the Last Year: Never true   Arboriculturist in the Last Year: Never true  Transportation Needs: No Transportation Needs   Lack of Transportation (Medical): No   Lack of Transportation (Non-Medical): No  Physical Activity: Inactive   Days of Exercise per Week: 0 days   Minutes of Exercise per Session: 0  min  Stress: No Stress Concern Present   Feeling of Stress : Not at all  Social Connections: Moderately Isolated   Frequency of Communication with Friends and Family: More than three times a week   Frequency of Social Gatherings with Friends and Family: More than  three times a week   Attends Religious Services: More than 4 times per year   Active Member of Clubs or Organizations: No   Attends Archivist Meetings: Never   Marital Status: Divorced  Human resources officer Violence: Not At Risk   Fear of Current or Ex-Partner: No   Emotionally Abused: No   Physically Abused: No   Sexually Abused: No    Outpatient Medications Prior to Visit  Medication Sig Dispense Refill   albuterol (VENTOLIN HFA) 108 (90 Base) MCG/ACT inhaler INHALE TWO PUFFS BY MOUTH INTO THE LUNGS EVERY 6 HOURS AS NEEDED FOR WHEEZING OR SHORTNESS OF BREATH 8.5 g 2   anastrozole (ARIMIDEX) 1 MG tablet Take 1 tablet (1 mg total) by mouth daily. 90 tablet 3   Calcium Carbonate-Vitamin D 600-400 MG-UNIT tablet Take 1 tablet by mouth 2 (two) times daily.     cholecalciferol (VITAMIN D) 1000 UNITS tablet Take 3,000 Units by mouth daily.      ibuprofen (ADVIL) 800 MG tablet Take 1 tablet (800 mg total) by mouth every 8 (eight) hours as needed. 30 tablet 0   LORazepam (ATIVAN) 1 MG tablet Take 1 mg by mouth 2 (two) times daily as needed for anxiety.      OLANZapine (ZYPREXA) 5 MG tablet Take 7.5 mg by mouth daily.      OXYGEN Inhale 3 L into the lungs.     Spacer/Aero-Holding Chambers (AEROCHAMBER MV) inhaler Use as instructed 1 each 0   Tiotropium Bromide-Olodaterol (STIOLTO RESPIMAT) 2.5-2.5 MCG/ACT AERS INHALE TWO PUFFS INTO THE LUNGS DAILY 4 g 2   vitamin E 180 MG (400 UNITS) capsule Take 400 Units by mouth daily.     dexlansoprazole (DEXILANT) 60 MG capsule TAKE ONE CAPSULE BY MOUTH DAILY 90 capsule 0   traMADol (ULTRAM) 50 MG tablet Take 1 tablet (50 mg total) by mouth every 6 (six) hours as needed. (Patient not  taking: Reported on 08/01/2021) 20 tablet 0   vitamin B-12 (CYANOCOBALAMIN) 1000 MCG tablet TAKE ONE TABLET BY MOUTH DAILY 30 tablet 3   No facility-administered medications prior to visit.    Allergies  Allergen Reactions   Sulfonamide Derivatives Hives    ROS See HPI    Objective:    Physical Exam Constitutional:      General: She is not in acute distress.    Appearance: Normal appearance. She is not ill-appearing.  HENT:     Head: Normocephalic and atraumatic.     Right Ear: External ear normal.     Left Ear: External ear normal.  Eyes:     Extraocular Movements: Extraocular movements intact.     Pupils: Pupils are equal, round, and reactive to light.  Cardiovascular:     Rate and Rhythm: Normal rate and regular rhythm.     Heart sounds: Normal heart sounds. No murmur heard.   No gallop.  Pulmonary:     Effort: Pulmonary effort is normal. No respiratory distress.     Breath sounds: Decreased breath sounds (decreased breath sounds throughout) present. No wheezing.  Skin:    General: Skin is warm and dry.  Neurological:     Mental Status: She is alert and oriented to person, place, and time.  Psychiatric:        Judgment: Judgment normal.    BP (!) 143/93 (BP Location: Right Arm, Patient Position: Sitting, Cuff Size: Small)   Pulse 93   Temp 97.8 F (36.6 C) (Oral)   Resp 16   Wt  144 lb (65.3 kg)   SpO2 96%   BMI 26.34 kg/m  Wt Readings from Last 3 Encounters:  10/28/21 144 lb (65.3 kg)  10/22/21 143 lb (64.9 kg)  10/22/21 143 lb (64.9 kg)       Assessment & Plan:   Problem List Items Addressed This Visit       Unprioritized   Vitamin D deficiency   Relevant Orders   Vitamin D (25 hydroxy)   Schizoaffective disorder ([place])    Stable, management per psychiatry.        Osteoporosis    She would like to initiate Prolia.         GERD (gastroesophageal reflux disease)    Stable on dexilant. Continue same.        Relevant Medications    dexlansoprazole (DEXILANT) 60 MG capsule   Ductal carcinoma in situ (DCIS) of left breast    Management per oncology. Has upcoming appointment.       Depression    Stable, management per psychiatry.        COPD (chronic obstructive pulmonary disease) (H[facility])    Stable on current regimen (oxygen, Stiolto and albuterol prn). Management per pulmonology.        B12 deficiency    Continue oral b12.         Other Visit Diagnoses     Colon cancer screening    -  Primary   Relevant Orders   Ambulatory referral to Gastroenterology   Hyperlipidemia, unspecified hyperlipidemia type       Relevant Orders   Lipid panel   Hyperglycemia       Relevant Orders   Basic metabolic panel   Hemoglobin A1c   Hypertriglyceridemia            Meds ordered this encounter  Medications   diptheria-tetanus toxoids (DECAVAC) 2-2 LF/0.5ML injection    Sig: Inject 0.5 mLs into the muscle once for 1 dose.    Dispense:  0.5 mL    Refill:  0    Order Specific Question:   Supervising Provider    Answer:   Penni Homans A [[id number]]   dexlansoprazole (DEXILANT) 60 MG capsule    Sig: Take 1 capsule (60 mg total) by mouth daily.    Dispense:  90 capsule    Refill:  1    Order Specific Question:   Supervising Provider    Answer:   Penni Homans A [[id number]]   vitamin B-12 (CYANOCOBALAMIN) 1000 MCG tablet    Sig: Take 1 tablet (1,000 mcg total) by mouth daily.    Dispense:  90 tablet    Refill:  1    Order Specific Question:   Supervising Provider    Answer:   Penni Homans A [[id number]]    I, Nance Pear, NP, personally preformed the services described in this documentation.  All medical record entries made by the scribe were at my direction and in my presence.  I have reviewed the chart and discharge instructions (if applicable) and agree that the record reflects my personal performance and is accurate and complete. 10/28/2021   I,Abigail Castillo,acting as a scribe for Nance Pear,  NP.,have documented all relevant documentation on the behalf of Nance Pear, NP,as directed by  Nance Pear, NP while in the presence of Nance Pear, NP.   Nance Pear, NP

## 2021-10-28 NOTE — Assessment & Plan Note (Signed)
Stable on current regimen (oxygen, Stiolto and albuterol prn). Management per pulmonology.

## 2021-10-28 NOTE — Assessment & Plan Note (Signed)
Stable on dexilant. Continue same.

## 2021-10-29 ENCOUNTER — Encounter: Payer: Self-pay | Admitting: Family

## 2021-10-29 NOTE — Progress Notes (Signed)
Mailed out to pt 

## 2021-10-30 NOTE — Telephone Encounter (Signed)
Prolia VOB initiated via [email or web address] ? ?New start ? ?

## 2021-11-29 ENCOUNTER — Other Ambulatory Visit: Payer: Self-pay | Admitting: Emergency Medicine

## 2021-12-01 NOTE — Telephone Encounter (Signed)
Prior auth required for PROLIA  PA PROCESS DETAILS: Effective 05/26/2021 if the patient is new to Prolia, Prior authorization and Step Therapy are required & not on file. Please go to [email or web address] or call [phone] to initiate the prior authorization. For exception to the policy please visit [email or web address] and review Policy Number [email or web address] 

## 2021-12-04 ENCOUNTER — Ambulatory Visit (INDEPENDENT_AMBULATORY_CARE_PROVIDER_SITE_OTHER): Payer: Medicare Other | Admitting: Emergency Medicine

## 2021-12-04 ENCOUNTER — Encounter: Payer: Self-pay | Admitting: Emergency Medicine

## 2021-12-04 DIAGNOSIS — J309 Allergic rhinitis, unspecified: Secondary | ICD-10-CM | POA: Diagnosis not present

## 2021-12-04 DIAGNOSIS — J9611 Chronic respiratory failure with hypoxia: Secondary | ICD-10-CM

## 2021-12-04 DIAGNOSIS — J449 Chronic obstructive pulmonary disease, unspecified: Secondary | ICD-10-CM

## 2021-12-04 DIAGNOSIS — K219 Gastro-esophageal reflux disease without esophagitis: Secondary | ICD-10-CM | POA: Diagnosis not present

## 2021-12-04 NOTE — Assessment & Plan Note (Signed)
No flares.  She does have some decreased functional capacity.  She is not very active and is probably having at least some component of deconditioning.  She has some exertional hypoxemia, likely needs to uptitrate her oxygen as below.  I encouraged her to do so and remain active.  We will continue the Stiolto, albuterol as needed.

## 2021-12-04 NOTE — Assessment & Plan Note (Signed)
Stable on Dexilant. 

## 2021-12-04 NOTE — Assessment & Plan Note (Signed)
Desaturated with walking on 2 L/min, we will uptitrate to 3 L/min with heavier exertion.  She understands the plan.

## 2021-12-04 NOTE — Progress Notes (Signed)
Subjective:    Patient ID: Abigail Castillo, female    DOB: Aug 28, 1950, 71 y.o.   MRN: [id number]  COPD She complains of shortness of breath. There is no cough or wheezing. Pertinent negatives include no ear pain, fever, headaches, postnasal drip, rhinorrhea, sneezing, sore throat or trouble swallowing. Her past medical history is significant for COPD.    [name] 03/21/21 --71 year old woman who follows up today for her severe COPD with associated exertional hypoxemia.  Also with a history of allergic rhinitis.  She is on oxygen at 2 L/min, reports Currently managed on Stiolto, uses albuterol about 0-1 time a day. Had the flu shot recently, no COVID shots. No flares. Minimal cough or sputum. Rare clear mucous.  She reports that her breathing does have some ups and downs - can have dyspnea with heavier exertion like housework. She doesn't walk far distances - uses her wheelchair. Usually wears her O2   [name] 05/10/21 --Abigail Castillo has chronic hypoxemic respiratory failure in the setting of severe COPD.  Also with a history of allergic rhinitis.  She has been managed on Stiolto. She has been more SOB today, has been more active today which has led to dyspnea. No flares, no pred or abx. She had some increased nasal congestion and sinus drainage for the last few day.  Uses oxygen at 2 L/min, apparently needed to have a repeat walking oximetry done per her DME.  [name] 12/04/21 --this a follow-up visit for 71 year old woman with a history of severe COPD and associated exertional hypoxemia.  She had been using 2 L/min, has been noted to have some desaturations with exertion and needed increased to 3 L/min.  She desaturated with ambulation here today and was uptitrated to 3 L/min.  Currently managed on Stiolto, albuterol which she uses about 4 x a day. She has SOB with exertion, especially a few stairs. Occasional cough. No flares or pred, abx since last time.  She is on Dexilant.  At her last visit we discussed  starting loratadine, fluticasone nasal spray as needed She underwent a left breast lumpectomy that showed DCIS on 07/03/2021. She had sgy and is on arimidex.     Review of Systems  Constitutional:  Negative for fever and unexpected weight change.  HENT:  Negative for congestion, dental problem, ear pain, nosebleeds, postnasal drip, rhinorrhea, sinus pressure, sneezing, sore throat and trouble swallowing.   Eyes:  Negative for redness and itching.  Respiratory:  Positive for shortness of breath. Negative for cough, chest tightness and wheezing.   Cardiovascular:  Negative for palpitations and leg swelling.  Gastrointestinal:  Negative for nausea and vomiting.  Genitourinary:  Negative for dysuria.  Musculoskeletal:  Negative for joint swelling.  Skin:  Negative for rash.  Neurological:  Negative for headaches.  Hematological:  Does not bruise/bleed easily.  Psychiatric/Behavioral:  Negative for dysphoric mood. The patient is not nervous/anxious.        Objective:   Physical Exam  Vitals:   12/04/21 1610  BP: 118/84  Pulse: 88  Temp: 98.2 F (36.8 C)  TempSrc: Oral  SpO2: 95%  Weight: 144 lb 12.8 oz (65.7 kg)  Height: '5\' 2"'$  (1.575 m)   Gen: Pleasant, well-nourished, in no distress  ENT: No lesions,  Edentulous, mouth clear,  oropharynx clear, no postnasal drip  Neck: No JVD, no stridor  Lungs: No use of accessory muscles, distant,   Cardiovascular: RRR, heart sounds normal, no murmur or gallops, no peripheral edema  Musculoskeletal: No deformities,  no cyanosis or clubbing  Neuro: alert, non focal, oriented   Skin: Warm, no lesions or rashes       Assessment & Plan:  COPD (chronic obstructive pulmonary disease) (HCC) No flares.  She does have some decreased functional capacity.  She is not very active and is probably having at least some component of deconditioning.  She has some exertional hypoxemia, likely needs to uptitrate her oxygen as below.  I encouraged her  to do so and remain active.  We will continue the Stiolto, albuterol as needed.  Chronic respiratory failure ([place]) Desaturated with walking on 2 L/min, we will uptitrate to 3 L/min with heavier exertion.  She understands the plan.  Allergic rhinitis She did not start loratadine or fluticasone after our visit last time.  I asked her to go ahead and get the loratadine and start it  GERD (gastroesophageal reflux disease) Stable on Dexilant  Baltazar Apo, MD, PhD 12/04/2021, 4:31 PM [facility] [phone] or if no answer [phone]

## 2021-12-04 NOTE — Patient Instructions (Addendum)
Please continue your Stiolto 2 puffs once daily. Keep your albuterol available to use 2 puffs as needed for shortness of breath, chest tightness, wheezing. Please continue to use your oxygen at 2-3 L/min depending on your level of exertion.  Turn the oxygen up to 3 L/min when you are doing more activity. Continue Dexilant as you have been taking it Try starting loratadine 10 mg once daily. Follow with Dr Lamonte Sakai in 6 months or sooner if you have any problems

## 2021-12-04 NOTE — Assessment & Plan Note (Signed)
She did not start loratadine or fluticasone after our visit last time.  I asked her to go ahead and get the loratadine and start it

## 2021-12-15 NOTE — Telephone Encounter (Signed)
Prior Authorization initiated for [facility] via [facility] Provider portal.  Case ID: [id number]

## 2021-12-18 ENCOUNTER — Encounter: Payer: Self-pay | Admitting: Internal Medicine

## 2021-12-18 NOTE — Telephone Encounter (Signed)
Left vm to return call.    

## 2021-12-18 NOTE — Telephone Encounter (Signed)
Patient's daughter returned call that was left for patient. Advised the call was to schedule her Prolia which will be $0 out of pocket. Daughter will speak with patient to see if she wants to get that scheduled.

## 2021-12-18 NOTE — Telephone Encounter (Signed)
PA# [id number] Valid:12/15/21-12/16/22

## 2021-12-18 NOTE — Telephone Encounter (Signed)
Pt ready for scheduling on or after 12/15/21  Out-of-pocket cost due at time of visit: $0  Primary: UHC Medicare Dual Complete Prolia co-insurance: 0% Admin fee co-insurance: 0%  Secondary: n/a Prolia co-insurance:  Admin fee co-insurance:   Deductible: does not apply  Prior Auth: APPROVED PA# [id number] Valid:12/15/21-12/16/22    ** This summary of benefits is an estimation of the patient's out-of-pocket cost. Exact cost may very based on individual plan coverage.

## 2021-12-24 ENCOUNTER — Encounter: Payer: Self-pay | Admitting: *Deleted

## 2021-12-24 DIAGNOSIS — Z9981 Dependence on supplemental oxygen: Secondary | ICD-10-CM | POA: Insufficient documentation

## 2022-02-05 ENCOUNTER — Encounter: Payer: Medicare Other | Admitting: Internal Medicine

## 2022-02-14 ENCOUNTER — Other Ambulatory Visit: Payer: Self-pay | Admitting: Hematology and Oncology

## 2022-02-21 ENCOUNTER — Inpatient Hospital Stay: Payer: Medicare Other | Attending: Hematology and Oncology | Admitting: Hematology and Oncology

## 2022-02-21 ENCOUNTER — Other Ambulatory Visit: Payer: Self-pay

## 2022-02-21 ENCOUNTER — Encounter: Payer: Self-pay | Admitting: Hematology and Oncology

## 2022-02-21 VITALS — BP 111/88 | HR 89 | Temp 97.7°F | Resp 18 | Ht 62.0 in | Wt 145.1 lb

## 2022-02-21 DIAGNOSIS — D0512 Intraductal carcinoma in situ of left breast: Secondary | ICD-10-CM | POA: Diagnosis not present

## 2022-02-21 DIAGNOSIS — Z79811 Long term (current) use of aromatase inhibitors: Secondary | ICD-10-CM | POA: Diagnosis not present

## 2022-02-21 NOTE — Progress Notes (Signed)
[facility] CONSULT NOTE  Patient Care Team: [name], NP as PCP - General (Internal Medicine) Gatha Mayer, MD as Consulting Physician (Gastroenterology) Lendon Colonel, MD as Referring Physician (Psychiatry) Erroll Luna, MD as Consulting Physician (General Surgery) Hayden Pedro, PA-C as Physician Assistant (Radiation Oncology) Hayden Pedro, PA-C as Physician Assistant (Radiation Oncology) Delice Bison, Charlestine Massed, NP as Nurse Practitioner (Hematology and Oncology) Benay Pike, MD as Consulting Physician (Hematology and Oncology) Harmon Pier, RN as Registered Nurse  CHIEF COMPLAINTS/PURPOSE OF CONSULTATION:  Newly diagnosed breast cancer  HISTORY OF PRESENTING ILLNESS:  Abigail Castillo 71 y.o. female is here because of recent diagnosis of left breast DCIS  This is a 71 year old postmenopausal female patient who presented with abnormal screening mammogram.  Mammogram showed a 5 cm area of distortion and calcifications in the left breast and core biopsy showed DCIS high-grade with comedonecrosis.   Prognostics showed ER +100% strong staining intensity PR 40% positive strong staining intensity she was seen by Dr. Brantley Stage from breast surgery for further recommendations. She is scheduled for surgery tomorrow.  She is now referred to medical oncology and radiation oncology for adjuvant recommendations.  Past medical history significant for COPD, oxygen dependency, osteoporosis. She does have a diagnosis of osteoporosis which is not yet treated. Since last visit, she had left breast lumpectomy which showed DCIS, high-grade, 0.6 cm, margins uninvolved by carcinoma, previous biopsy site changes, small incidental intraductal papilloma.  Prior prognostic showed ER positive, 100% strong PR positive, 40% strong  Interval history  She declined adjuvant radiation.  She is here to follow-up on adjuvant anastrozole. She is tolerating it well. She  denies any complaints.  I reviewed her records extensively and collaborated the history with the patient.  SUMMARY OF ONCOLOGIC HISTORY: Oncology History   No history exists.     MEDICAL HISTORY:  Past Medical History:  Diagnosis Date   Acute on chronic respiratory failure with hypoxia ([place]) 07/05/2019   Anxiety    Blood transfusion    Blood transfusion without reported diagnosis    Breast cancer ([place]) 05/13/2021   Chronic mental illness    COPD (chronic obstructive pulmonary disease) ([place])    COVID-19 virus infection 07/06/2019   DEPRESSION 05/31/2009   Emphysema of lung ([place])    Esophageal stricture 03/14/2015   GERD (gastroesophageal reflux disease)    Hepatic steatosis    History of home oxygen therapy    History of pneumonia    Osteoporosis    Oxygen dependent    on O2 at 2-3L/min 24hr/day   Shortness of breath dyspnea    Tobacco abuse    ongoing   Vitamin B 12 deficiency    Vitamin D deficiency     SURGICAL HISTORY: Past Surgical History:  Procedure Laterality Date   ABDOMINAL HYSTERECTOMY     BREAST EXCISIONAL BIOPSY Left 2017   BREAST LUMPECTOMY  1972   left breast   BREAST LUMPECTOMY WITH RADIOACTIVE SEED LOCALIZATION Left 11/22/2015   Procedure: LEFT BREAST LUMPECTOMY WITH RADIOACTIVE SEED LOCALIZATION;  Surgeon: Erroll Luna, MD;  Location: [place];  Service: General;  Laterality: Left;   BREAST LUMPECTOMY WITH RADIOACTIVE SEED LOCALIZATION Left 07/03/2021   Procedure: LEFT BREAST LUMPECTOMY WITH RADIOACTIVE SEED LOCALIZATION;  Surgeon: Erroll Luna, MD;  Location: [place];  Service: General;  Laterality: Left;   BREAST SURGERY  ?2015   lumpectomy, left   CERVICAL CONE BIOPSY     CHOLECYSTECTOMY     COLONOSCOPY  ESOPHAGOGASTRODUODENOSCOPY (EGD) WITH ESOPHAGEAL DILATION     UPPER GASTROINTESTINAL ENDOSCOPY      SOCIAL HISTORY: Social History   Socioeconomic History   Marital status: Divorced    Spouse name: Not on file   Number of children: 3    Years of education: Not on file   Highest education level: Not on file  Occupational History   Occupation: disabled    Employer: UNEMPLOYED  Tobacco Use   Smoking status: Former    Packs/day: 1.00    Years: 40.00    Total pack years: 40.00    Types: Cigarettes    Quit date: 08/12/2011    Years since quitting: 10.5    Passive exposure: Past   Smokeless tobacco: Never  Vaping Use   Vaping Use: Never used  Substance and Sexual Activity   Alcohol use: No    Alcohol/week: 0.0 standard drinks of alcohol   Drug use: No   Sexual activity: Never  Other Topics Concern   Not on file  Social History Narrative   Lost a son to Lung CA 1/18   Lives with a son in [place]   Has a daughter who lives locally   Social Determinants of Health   Financial Resource Strain: [place]  (03/26/2021)   Overall Financial Resource Strain (CARDIA)    Difficulty of Paying Living Expenses: Not hard at all  Food Insecurity: No Food Insecurity (03/26/2021)   Hunger Vital Sign    Worried About Running Out of Food in the Last Year: Never true    [place] in the Last Year: Never true  Transportation Needs: No Transportation Needs (03/26/2021)   PRAPARE - Hydrologist (Medical): No    Lack of Transportation (Non-Medical): No  Physical Activity: Inactive (03/26/2021)   Exercise Vital Sign    Days of Exercise per Week: 0 days    Minutes of Exercise per Session: 0 min  Stress: No Stress Concern Present (03/26/2021)   [place]    Feeling of Stress : Not at all  Social Connections: Moderately Isolated (03/26/2021)   Social Connection and Isolation Panel [NHANES]    Frequency of Communication with Friends and Family: More than three times a week    Frequency of Social Gatherings with Friends and Family: More than three times a week    Attends Religious Services: More than 4 times per year    Active Member  of [employer] or Organizations: No    Attends Archivist Meetings: Never    Marital Status: Divorced  Human resources officer Violence: Not At Risk (03/26/2021)   Humiliation, Afraid, Rape, and Kick questionnaire    Fear of Current or Ex-Partner: No    Emotionally Abused: No    Physically Abused: No    Sexually Abused: No    FAMILY HISTORY: Family History  Problem Relation Age of Onset   Coronary artery disease Mother    Diabetes Mother    Hypertension Mother    Coronary artery disease Father    Diabetes Father        Grandfather   Hypertension Father    Stroke Father    Other Father        colostomy bag   Breast cancer Sister    Breast cancer Sister    Diabetes Sister    Breast cancer Sister    Hypertension Sister    Mental illness Sister  x 4   Cancer Other        niece--breast   Esophageal cancer Neg Hx    Stomach cancer Neg Hx    Rectal cancer Neg Hx    Colon cancer Neg Hx    Colon polyps Neg Hx     ALLERGIES:  is allergic to sulfonamide derivatives.  MEDICATIONS:  Current Outpatient Medications  Medication Sig Dispense Refill   albuterol (VENTOLIN HFA) 108 (90 Base) MCG/ACT inhaler INHALE TWO PUFFS BY MOUTH INTO THE LUNGS EVERY 6 HOURS AS NEEDED FOR WHEEZING OR SHORTNESS OF BREATH 8.5 g 1   anastrozole (ARIMIDEX) 1 MG tablet TAKE ONE TABLET BY MOUTH DAILY 90 tablet 2   Calcium Carbonate-Vitamin D 600-400 MG-UNIT tablet Take 1 tablet by mouth 2 (two) times daily.     cholecalciferol (VITAMIN D) 1000 UNITS tablet Take 3,000 Units by mouth daily.      dexlansoprazole (DEXILANT) 60 MG capsule Take 1 capsule (60 mg total) by mouth daily. 90 capsule 1   ibuprofen (ADVIL) 800 MG tablet Take 1 tablet (800 mg total) by mouth every 8 (eight) hours as needed. 30 tablet 0   LORazepam (ATIVAN) 1 MG tablet Take 1 mg by mouth 2 (two) times daily as needed for anxiety.      OLANZapine (ZYPREXA) 5 MG tablet Take 7.5 mg by mouth daily.      OXYGEN Inhale 3 L into the lungs.      Spacer/Aero-Holding Chambers (AEROCHAMBER MV) inhaler Use as instructed 1 each 0   Tiotropium Bromide-Olodaterol (STIOLTO RESPIMAT) 2.5-2.5 MCG/ACT AERS INHALE TWO PUFFS INTO THE LUNGS DAILY 4 g 2   vitamin B-12 (CYANOCOBALAMIN) 1000 MCG tablet Take 1 tablet (1,000 mcg total) by mouth daily. 90 tablet 1   vitamin E 180 MG (400 UNITS) capsule Take 400 Units by mouth daily.     No current facility-administered medications for this visit.    PHYSICAL EXAMINATION: ECOG PERFORMANCE STATUS: 0 - Asymptomatic  Vitals:   02/21/22 0908  BP: 111/88  Pulse: 89  Resp: 18  Temp: 97.7 F (36.5 C)  SpO2: 97%    Filed Weights   02/21/22 0908  Weight: 145 lb 1.6 oz (65.8 kg)    Physical Exam Constitutional:      Appearance: Normal appearance.  Cardiovascular:     Rate and Rhythm: Normal rate and regular rhythm.  Pulmonary:     Breath sounds: Normal breath sounds.  Chest:     Comments: Bilateral breast examined in sitting position.  Left breast status post surgery.  No palpable masses or regional adenopathy.  Right breast normal to inspection and palpation. Musculoskeletal:        General: No swelling.     Cervical back: Normal range of motion and neck supple. No rigidity.  Lymphadenopathy:     Cervical: No cervical adenopathy.  Neurological:     Mental Status: She is alert.      LABORATORY DATA:  I have reviewed the data as listed Lab Results  Component Value Date   WBC 5.5 07/03/2021   HGB 13.4 07/03/2021   HCT 40.6 07/03/2021   MCV 97.4 07/03/2021   PLT 194 07/03/2021   Lab Results  Component Value Date   NA 141 10/28/2021   K 4.4 10/28/2021   CL 102 10/28/2021   CO2 32 10/28/2021    RADIOGRAPHIC STUDIES: I have personally reviewed the radiological reports and agreed with the findings in the report.  ASSESSMENT AND PLAN:   This is  a pleasant 71 year old female patient with left breast DCIS, high-grade, with comedonecrosis and ER/PR positive is underwent  lumpectomy and pathology showed 6 mm of high-grade DCIS.  Prior prognostics ER 100% positive strong staining, PR 40% positive strong staining. She is now here for follow up on anastrazole. She is treated by Dr. Inda Castle for osteoporosis. She is still debating if she wants to consider medication for osteoporosis. She is currently on calcium and vitamin D supplementation. Physical examination today without any concerns for breast cancer recurrence.  I ordered mammogram for December 2023 All questions were answered. The patient knows to call the clinic with any problems, questions or concerns. I spent 20 minutes in the care of this patient including history, physical exam, review of records, counseling and coordination of care as mentioned above.    Benay Pike, MD 02/21/22

## 2022-04-09 ENCOUNTER — Other Ambulatory Visit: Payer: Self-pay

## 2022-04-09 ENCOUNTER — Emergency Department (HOSPITAL_COMMUNITY): Payer: Medicare Other

## 2022-04-09 ENCOUNTER — Inpatient Hospital Stay (HOSPITAL_COMMUNITY)
Admission: EM | Admit: 2022-04-09 | Discharge: 2022-05-12 | DRG: 642 | Disposition: A | Discharge status: Skilled Nursing Facility | Payer: Medicare Other | Attending: Internal Medicine | Admitting: Internal Medicine

## 2022-04-09 ENCOUNTER — Ambulatory Visit: Payer: Medicare Other | Admitting: Emergency Medicine

## 2022-04-09 DIAGNOSIS — J9622 Acute and chronic respiratory failure with hypercapnia: Secondary | ICD-10-CM | POA: Diagnosis present

## 2022-04-09 DIAGNOSIS — D72829 Elevated white blood cell count, unspecified: Secondary | ICD-10-CM | POA: Diagnosis present

## 2022-04-09 DIAGNOSIS — R062 Wheezing: Secondary | ICD-10-CM | POA: Diagnosis not present

## 2022-04-09 DIAGNOSIS — M6259 Muscle wasting and atrophy, not elsewhere classified, multiple sites: Secondary | ICD-10-CM | POA: Diagnosis not present

## 2022-04-09 DIAGNOSIS — E871 Hypo-osmolality and hyponatremia: Secondary | ICD-10-CM | POA: Insufficient documentation

## 2022-04-09 DIAGNOSIS — E876 Hypokalemia: Secondary | ICD-10-CM | POA: Diagnosis not present

## 2022-04-09 DIAGNOSIS — Z5320 Procedure and treatment not carried out because of patient's decision for unspecified reasons: Secondary | ICD-10-CM | POA: Diagnosis not present

## 2022-04-09 DIAGNOSIS — D0512 Intraductal carcinoma in situ of left breast: Secondary | ICD-10-CM | POA: Diagnosis present

## 2022-04-09 DIAGNOSIS — J849 Interstitial pulmonary disease, unspecified: Secondary | ICD-10-CM | POA: Diagnosis not present

## 2022-04-09 DIAGNOSIS — E559 Vitamin D deficiency, unspecified: Secondary | ICD-10-CM | POA: Diagnosis not present

## 2022-04-09 DIAGNOSIS — F32A Depression, unspecified: Secondary | ICD-10-CM | POA: Diagnosis present

## 2022-04-09 DIAGNOSIS — Z87891 Personal history of nicotine dependence: Secondary | ICD-10-CM | POA: Diagnosis not present

## 2022-04-09 DIAGNOSIS — Z882 Allergy status to sulfonamides status: Secondary | ICD-10-CM

## 2022-04-09 DIAGNOSIS — Z79899 Other long term (current) drug therapy: Secondary | ICD-10-CM

## 2022-04-09 DIAGNOSIS — Z833 Family history of diabetes mellitus: Secondary | ICD-10-CM

## 2022-04-09 DIAGNOSIS — F419 Anxiety disorder, unspecified: Secondary | ICD-10-CM | POA: Diagnosis present

## 2022-04-09 DIAGNOSIS — J449 Chronic obstructive pulmonary disease, unspecified: Secondary | ICD-10-CM | POA: Diagnosis not present

## 2022-04-09 DIAGNOSIS — Z79811 Long term (current) use of aromatase inhibitors: Secondary | ICD-10-CM

## 2022-04-09 DIAGNOSIS — K219 Gastro-esophageal reflux disease without esophagitis: Secondary | ICD-10-CM | POA: Diagnosis not present

## 2022-04-09 DIAGNOSIS — E875 Hyperkalemia: Secondary | ICD-10-CM | POA: Diagnosis not present

## 2022-04-09 DIAGNOSIS — Z818 Family history of other mental and behavioral disorders: Secondary | ICD-10-CM

## 2022-04-09 DIAGNOSIS — J441 Chronic obstructive pulmonary disease with (acute) exacerbation: Secondary | ICD-10-CM

## 2022-04-09 DIAGNOSIS — R000 Tachycardia, unspecified: Secondary | ICD-10-CM | POA: Diagnosis not present

## 2022-04-09 DIAGNOSIS — J398 Other specified diseases of upper respiratory tract: Secondary | ICD-10-CM

## 2022-04-09 DIAGNOSIS — R739 Hyperglycemia, unspecified: Secondary | ICD-10-CM | POA: Diagnosis present

## 2022-04-09 DIAGNOSIS — Z8249 Family history of ischemic heart disease and other diseases of the circulatory system: Secondary | ICD-10-CM

## 2022-04-09 DIAGNOSIS — T380X5A Adverse effect of glucocorticoids and synthetic analogues, initial encounter: Secondary | ICD-10-CM | POA: Diagnosis not present

## 2022-04-09 DIAGNOSIS — Z20822 Contact with and (suspected) exposure to covid-19: Secondary | ICD-10-CM | POA: Diagnosis present

## 2022-04-09 DIAGNOSIS — Z9981 Dependence on supplemental oxygen: Secondary | ICD-10-CM

## 2022-04-09 DIAGNOSIS — Z803 Family history of malignant neoplasm of breast: Secondary | ICD-10-CM

## 2022-04-09 DIAGNOSIS — Z515 Encounter for palliative care: Secondary | ICD-10-CM | POA: Diagnosis not present

## 2022-04-09 DIAGNOSIS — J9809 Other diseases of bronchus, not elsewhere classified: Secondary | ICD-10-CM

## 2022-04-09 DIAGNOSIS — Z9049 Acquired absence of other specified parts of digestive tract: Secondary | ICD-10-CM | POA: Diagnosis not present

## 2022-04-09 DIAGNOSIS — I272 Pulmonary hypertension, unspecified: Secondary | ICD-10-CM | POA: Diagnosis present

## 2022-04-09 DIAGNOSIS — I10 Essential (primary) hypertension: Secondary | ICD-10-CM | POA: Diagnosis not present

## 2022-04-09 DIAGNOSIS — R0689 Other abnormalities of breathing: Secondary | ICD-10-CM | POA: Diagnosis not present

## 2022-04-09 DIAGNOSIS — J9811 Atelectasis: Secondary | ICD-10-CM | POA: Diagnosis not present

## 2022-04-09 DIAGNOSIS — M810 Age-related osteoporosis without current pathological fracture: Secondary | ICD-10-CM | POA: Diagnosis present

## 2022-04-09 DIAGNOSIS — R0600 Dyspnea, unspecified: Secondary | ICD-10-CM | POA: Diagnosis not present

## 2022-04-09 DIAGNOSIS — Z823 Family history of stroke: Secondary | ICD-10-CM

## 2022-04-09 DIAGNOSIS — Z8701 Personal history of pneumonia (recurrent): Secondary | ICD-10-CM

## 2022-04-09 DIAGNOSIS — Z7189 Other specified counseling: Secondary | ICD-10-CM | POA: Diagnosis not present

## 2022-04-09 DIAGNOSIS — J9690 Respiratory failure, unspecified, unspecified whether with hypoxia or hypercapnia: Secondary | ICD-10-CM | POA: Diagnosis not present

## 2022-04-09 DIAGNOSIS — J9621 Acute and chronic respiratory failure with hypoxia: Secondary | ICD-10-CM | POA: Diagnosis present

## 2022-04-09 DIAGNOSIS — R0902 Hypoxemia: Secondary | ICD-10-CM | POA: Diagnosis not present

## 2022-04-09 DIAGNOSIS — T68XXXA Hypothermia, initial encounter: Secondary | ICD-10-CM | POA: Diagnosis not present

## 2022-04-09 DIAGNOSIS — Z8616 Personal history of COVID-19: Secondary | ICD-10-CM | POA: Diagnosis not present

## 2022-04-09 DIAGNOSIS — Z90710 Acquired absence of both cervix and uterus: Secondary | ICD-10-CM

## 2022-04-09 DIAGNOSIS — J439 Emphysema, unspecified: Secondary | ICD-10-CM | POA: Diagnosis not present

## 2022-04-09 DIAGNOSIS — E785 Hyperlipidemia, unspecified: Principal | ICD-10-CM | POA: Diagnosis present

## 2022-04-09 DIAGNOSIS — R0602 Shortness of breath: Secondary | ICD-10-CM | POA: Diagnosis not present

## 2022-04-09 DIAGNOSIS — Z743 Need for continuous supervision: Secondary | ICD-10-CM | POA: Diagnosis not present

## 2022-04-09 DIAGNOSIS — R600 Localized edema: Secondary | ICD-10-CM

## 2022-04-09 DIAGNOSIS — Z741 Need for assistance with personal care: Secondary | ICD-10-CM | POA: Diagnosis not present

## 2022-04-09 DIAGNOSIS — M6281 Muscle weakness (generalized): Secondary | ICD-10-CM | POA: Diagnosis not present

## 2022-04-09 DIAGNOSIS — R54 Age-related physical debility: Secondary | ICD-10-CM | POA: Diagnosis present

## 2022-04-09 DIAGNOSIS — Z853 Personal history of malignant neoplasm of breast: Secondary | ICD-10-CM

## 2022-04-09 DIAGNOSIS — R1312 Dysphagia, oropharyngeal phase: Secondary | ICD-10-CM | POA: Diagnosis not present

## 2022-04-09 DIAGNOSIS — K760 Fatty (change of) liver, not elsewhere classified: Secondary | ICD-10-CM | POA: Diagnosis not present

## 2022-04-09 DIAGNOSIS — K449 Diaphragmatic hernia without obstruction or gangrene: Secondary | ICD-10-CM | POA: Diagnosis not present

## 2022-04-09 DIAGNOSIS — E538 Deficiency of other specified B group vitamins: Secondary | ICD-10-CM | POA: Diagnosis present

## 2022-04-09 DIAGNOSIS — K5900 Constipation, unspecified: Secondary | ICD-10-CM | POA: Diagnosis not present

## 2022-04-09 DIAGNOSIS — R069 Unspecified abnormalities of breathing: Secondary | ICD-10-CM | POA: Diagnosis not present

## 2022-04-09 DIAGNOSIS — R2681 Unsteadiness on feet: Secondary | ICD-10-CM | POA: Diagnosis not present

## 2022-04-09 LAB — CBC
HCT: 42.7 % (ref 36.0–46.0)
Hemoglobin: 13.6 g/dL (ref 12.0–15.0)
MCH: 30.7 pg (ref 26.0–34.0)
MCHC: 31.9 g/dL (ref 30.0–36.0)
MCV: 96.4 fL (ref 80.0–100.0)
Platelets: 215 10*3/uL (ref 150–400)
RBC: 4.43 MIL/uL (ref 3.87–5.11)
RDW: 12.8 % (ref 11.5–15.5)
WBC: 12.2 10*3/uL — ABNORMAL HIGH (ref 4.0–10.5)
nRBC: 0 % (ref 0.0–0.2)

## 2022-04-09 LAB — COMPREHENSIVE METABOLIC PANEL
ALT: 25 U/L (ref 0–44)
AST: 25 U/L (ref 15–41)
Albumin: 3.6 g/dL (ref 3.5–5.0)
Alkaline Phosphatase: 104 U/L (ref 38–126)
Anion gap: 13 (ref 5–15)
BUN: 13 mg/dL (ref 8–23)
CO2: 23 mmol/L (ref 22–32)
Calcium: 8.1 mg/dL — ABNORMAL LOW (ref 8.9–10.3)
Chloride: 100 mmol/L (ref 98–111)
Creatinine, Ser: 0.69 mg/dL (ref 0.44–1.00)
GFR, Estimated: >60 mL/min (ref >60)
Glucose, Bld: 181 mg/dL — ABNORMAL HIGH (ref 70–99)
Potassium: 3.8 mmol/L (ref 3.5–5.1)
Sodium: 136 mmol/L (ref 135–145)
Total Bilirubin: 1 mg/dL (ref 0.3–1.2)
Total Protein: 6.2 g/dL — ABNORMAL LOW (ref 6.5–8.1)

## 2022-04-09 LAB — RESP PANEL BY RT-PCR (FLU A&B, COVID) ARPGX2
Influenza A by PCR: NEGATIVE
Influenza B by PCR: NEGATIVE
SARS Coronavirus 2 by RT PCR: NEGATIVE

## 2022-04-09 LAB — BRAIN NATRIURETIC PEPTIDE: B Natriuretic Peptide: 22.7 pg/mL (ref 0.0–100.0)

## 2022-04-09 LAB — HIV ANTIBODY (ROUTINE TESTING W REFLEX): HIV Screen 4th Generation wRfx: NONREACTIVE

## 2022-04-09 MED ORDER — OLANZAPINE 5 MG PO TABS
7.5000 mg | ORAL_TABLET | Freq: Every day | ORAL | Status: DC
  Administered 2022-04-10 – 2022-05-12 (×33): 7.5 mg via ORAL
  Filled 2022-04-09 (×34): qty 1

## 2022-04-09 MED ORDER — IPRATROPIUM BROMIDE 0.02 % IN SOLN
0.5000 mg | Freq: Two times a day (BID) | RESPIRATORY_TRACT | Status: DC
  Administered 2022-04-10: 0.5 mg via RESPIRATORY_TRACT
  Filled 2022-04-09: qty 2.5

## 2022-04-09 MED ORDER — DOXYCYCLINE HYCLATE 100 MG PO TABS
100.0000 mg | ORAL_TABLET | Freq: Two times a day (BID) | ORAL | Status: AC
  Administered 2022-04-09 – 2022-04-13 (×10): 100 mg via ORAL
  Filled 2022-04-09 (×10): qty 1

## 2022-04-09 MED ORDER — GUAIFENESIN ER 600 MG PO TB12
1200.0000 mg | ORAL_TABLET | Freq: Two times a day (BID) | ORAL | Status: DC
  Administered 2022-04-09 – 2022-05-12 (×66): 1200 mg via ORAL
  Filled 2022-04-09 (×68): qty 2

## 2022-04-09 MED ORDER — IPRATROPIUM BROMIDE 0.02 % IN SOLN
0.5000 mg | Freq: Four times a day (QID) | RESPIRATORY_TRACT | Status: DC
  Administered 2022-04-09 (×2): 0.5 mg via RESPIRATORY_TRACT
  Filled 2022-04-09 (×2): qty 2.5

## 2022-04-09 MED ORDER — ENOXAPARIN SODIUM 40 MG/0.4ML IJ SOSY
40.0000 mg | PREFILLED_SYRINGE | INTRAMUSCULAR | Status: DC
  Administered 2022-04-09 – 2022-05-11 (×30): 40 mg via SUBCUTANEOUS
  Filled 2022-04-09 (×30): qty 0.4

## 2022-04-09 MED ORDER — METHYLPREDNISOLONE SODIUM SUCC 125 MG IJ SOLR
80.0000 mg | INTRAMUSCULAR | Status: AC
  Administered 2022-04-09: 80 mg via INTRAVENOUS
  Filled 2022-04-09: qty 2

## 2022-04-09 MED ORDER — PANTOPRAZOLE SODIUM 40 MG PO TBEC
40.0000 mg | DELAYED_RELEASE_TABLET | Freq: Every day | ORAL | Status: DC
  Administered 2022-04-09 – 2022-04-13 (×5): 40 mg via ORAL
  Filled 2022-04-09 (×5): qty 1

## 2022-04-09 MED ORDER — ARFORMOTEROL TARTRATE 15 MCG/2ML IN NEBU
15.0000 ug | INHALATION_SOLUTION | Freq: Two times a day (BID) | RESPIRATORY_TRACT | Status: DC
  Administered 2022-04-09 – 2022-05-12 (×66): 15 ug via RESPIRATORY_TRACT
  Filled 2022-04-09 (×68): qty 2

## 2022-04-09 MED ORDER — UMECLIDINIUM BROMIDE 62.5 MCG/ACT IN AEPB
1.0000 | INHALATION_SPRAY | Freq: Every day | RESPIRATORY_TRACT | Status: DC
  Administered 2022-04-10 – 2022-04-26 (×16): 1 via RESPIRATORY_TRACT
  Filled 2022-04-09 (×2): qty 7

## 2022-04-09 MED ORDER — ANASTROZOLE 1 MG PO TABS
1.0000 mg | ORAL_TABLET | Freq: Every day | ORAL | Status: DC
  Administered 2022-04-10 – 2022-05-12 (×33): 1 mg via ORAL
  Filled 2022-04-09 (×35): qty 1

## 2022-04-09 MED ORDER — IBUPROFEN 400 MG PO TABS: 800.0000 mg | ORAL_TABLET | Freq: Three times a day (TID) | ORAL | Status: DC | PRN

## 2022-04-09 MED ORDER — PREDNISONE 20 MG PO TABS: 40.0000 mg | ORAL_TABLET | Freq: Every day | ORAL | Status: DC

## 2022-04-09 MED ORDER — ALBUTEROL SULFATE (2.5 MG/3ML) 0.083% IN NEBU
2.5000 mg | INHALATION_SOLUTION | RESPIRATORY_TRACT | Status: DC | PRN
  Administered 2022-04-10 – 2022-04-25 (×8): 2.5 mg via RESPIRATORY_TRACT
  Filled 2022-04-09 (×9): qty 3

## 2022-04-09 MED ORDER — LORAZEPAM 1 MG PO TABS
1.0000 mg | ORAL_TABLET | Freq: Two times a day (BID) | ORAL | Status: DC | PRN
  Administered 2022-04-25 – 2022-05-05 (×10): 1 mg via ORAL
  Filled 2022-04-09 (×10): qty 1

## 2022-04-09 MED ORDER — ALBUTEROL SULFATE (2.5 MG/3ML) 0.083% IN NEBU
INHALATION_SOLUTION | RESPIRATORY_TRACT | Status: AC
  Administered 2022-04-09: 10 mg
  Filled 2022-04-09: qty 12

## 2022-04-09 MED ORDER — ALBUTEROL (5 MG/ML) CONTINUOUS INHALATION SOLN
10.0000 mg/h | INHALATION_SOLUTION | Freq: Once | RESPIRATORY_TRACT | Status: DC
  Filled 2022-04-09: qty 20

## 2022-04-09 NOTE — Progress Notes (Signed)
Removed patient from bipap and placed on 4lpm. Sp02=95%

## 2022-04-09 NOTE — ED Notes (Addendum)
Patient is resting

## 2022-04-09 NOTE — ED Provider Notes (Signed)
[facility] EMERGENCY DEPARTMENT Provider Note   CSN: [id number] Arrival date & time: 04/09/22  1749     History {Add pertinent medical, surgical, social history, OB history to HPI:1} Chief Complaint  Patient presents with   Cough   Shortness of Breath    Abigail Castillo is a 71 y.o. female.   Cough Associated symptoms: shortness of breath   Associated symptoms: no chest pain and no fever   Shortness of Breath Associated symptoms: cough   Associated symptoms: no chest pain and no fever      Patient has a history of COPD, depression, vitamin D and B12 deficiency, reflux, breast cancer.  Patient quit smoking years ago.  She is chronically on oxygen however for her COPD.  Patient states she started having URI type symptoms a few days ago with nasal congestion.  She has been feeling more short of breath and wheezing.  She has been using her medications and treatments without relief.  This morning she felt worse despite being on her home oxygen and she had to call EMS.  They noted her sats were 69% on 4 L.  Started on nonrebreather and breathing treatment.  Patient was also given 125 mg of Solu-Medrol and 2 g of magnesium.  Patient states she is feeling somewhat better  Home Medications Prior to Admission medications   Medication Sig Start Date End Date Taking? Authorizing Provider  albuterol (VENTOLIN HFA) 108 (90 Base) MCG/ACT inhaler INHALE TWO PUFFS BY MOUTH INTO THE LUNGS EVERY 6 HOURS AS NEEDED FOR WHEEZING OR SHORTNESS OF BREATH 11/29/21   Collene Gobble, MD  anastrozole (ARIMIDEX) 1 MG tablet TAKE ONE TABLET BY MOUTH DAILY 02/14/22   Benay Pike, MD  Calcium Carbonate-Vitamin D 600-400 MG-UNIT tablet Take 1 tablet by mouth 2 (two) times daily. 02/22/21   [name], NP  cholecalciferol (VITAMIN D) 1000 UNITS tablet Take 3,000 Units by mouth daily.     [provider]  dexlansoprazole (DEXILANT) 60 MG capsule Take 1 capsule (60 mg total) by  mouth daily. 10/28/21   [name], NP  ibuprofen (ADVIL) 800 MG tablet Take 1 tablet (800 mg total) by mouth every 8 (eight) hours as needed. 07/03/21   Cornett, Marcello Moores, MD  LORazepam (ATIVAN) 1 MG tablet Take 1 mg by mouth 2 (two) times daily as needed for anxiety.  09/07/17   [provider]  OLANZapine (ZYPREXA) 5 MG tablet Take 7.5 mg by mouth daily.  06/18/16   [provider]  OXYGEN Inhale 3 L into the lungs.    [provider]  Spacer/Aero-Holding Chambers (AEROCHAMBER MV) inhaler Use as instructed 03/13/17   Collene Gobble, MD  Tiotropium Bromide-Olodaterol (STIOLTO RESPIMAT) 2.5-2.5 MCG/ACT AERS INHALE TWO PUFFS INTO THE LUNGS DAILY 10/25/21   Collene Gobble, MD  vitamin B-12 (CYANOCOBALAMIN) 1000 MCG tablet Take 1 tablet (1,000 mcg total) by mouth daily. 10/28/21   [name], NP  vitamin E 180 MG (400 UNITS) capsule Take 400 Units by mouth daily.    [provider]      Allergies    Sulfonamide derivatives    Review of Systems   Review of Systems  Constitutional:  Negative for fever.  Respiratory:  Positive for cough and shortness of breath.   Cardiovascular:  Negative for chest pain.  Neurological:  Negative for syncope.    Physical Exam Updated Vital Signs BP (!) 148/97   Pulse (!) 119   Temp 97.9 F (36.6  C) (Oral)   Resp (!) 21   Ht 1.575 m ('5\' 2"'$ )   Wt 65.8 kg   SpO2 98%   BMI 26.53 kg/m  Physical Exam Vitals and nursing note reviewed.  Constitutional:      Appearance: She is well-developed. She is not diaphoretic.  HENT:     Head: Normocephalic and atraumatic.     Right Ear: External ear normal.     Left Ear: External ear normal.  Eyes:     General: No scleral icterus.       Right eye: No discharge.        Left eye: No discharge.     Conjunctiva/sclera: Conjunctivae normal.  Neck:     Trachea: No tracheal deviation.  Cardiovascular:     Rate and Rhythm: Normal rate and regular rhythm.  Pulmonary:      Effort: Pulmonary effort is normal. Tachypnea present.     Breath sounds: No stridor. Decreased breath sounds and wheezing present. No rales.  Abdominal:     General: Bowel sounds are normal. There is no distension.     Palpations: Abdomen is soft.     Tenderness: There is no abdominal tenderness. There is no guarding or rebound.  Musculoskeletal:        General: No tenderness or deformity.     Cervical back: Neck supple.     Right lower leg: No edema.     Left lower leg: No edema.  Skin:    General: Skin is warm and dry.     Findings: No rash.  Neurological:     General: No focal deficit present.     Mental Status: She is alert.     Cranial Nerves: No cranial nerve deficit (no facial droop, extraocular movements intact, no slurred speech).     Sensory: No sensory deficit.     Motor: No abnormal muscle tone or seizure activity.     Coordination: Coordination normal.  Psychiatric:        Mood and Affect: Mood normal.     ED Results / Procedures / Treatments   Labs (all labs ordered are listed, but only abnormal results are displayed) Labs Reviewed  COMPREHENSIVE METABOLIC PANEL - Abnormal; Notable for the following components:      Result Value   Glucose, Bld 181 (*)    Calcium 8.1 (*)    Total Protein 6.2 (*)    All other components within normal limits  CBC - Abnormal; Notable for the following components:   WBC 12.2 (*)    All other components within normal limits  RESP PANEL BY RT-PCR (FLU A&B, COVID) [id number]  BRAIN NATRIURETIC PEPTIDE    EKG EKG Interpretation  Date/Time:  Wednesday April 09 2022 07:35:56 EST Ventricular Rate:  127 PR Interval:  108 QRS Duration: 94 QT Interval:  298 QTC Calculation: 434 R Axis:   83 Text Interpretation: Sinus tachycardia Borderline right axis deviation Repol abnrm suggests ischemia, diffuse leads Since last tracing rate faster Confirmed by Dorie Rank [phone]) on 04/09/2022 8:26:02 AM  Radiology DG Chest Port 1  View  Result Date: 04/09/2022 CLINICAL DATA:  Dyspnea, history breast cancer, COPD, emphysema, GERD, hepatic steatosis EXAM: PORTABLE CHEST 1 VIEW COMPARISON:  Portable exam 0750 hours compared to 07/05/2019 FINDINGS: Normal heart size and mediastinal contours. Prominent central pulmonary arteries question pulmonary arterial hypertension. Severe emphysematous changes, greater on RIGHT. Bibasilar chronic interstitial lung disease changes again seen. No acute infiltrate, pleural effusion, or pneumothorax. Diffuse osseous demineralization. IMPRESSION:  COPD changes with severe bibasilar chronic interstitial lung disease. Question pulmonary arterial hypertension. No acute abnormalities or interval change. Electronically Signed   By: Lavonia Dana M.D.   On: 04/09/2022 07:58    Procedures Procedures  {Document cardiac monitor, telemetry assessment procedure when appropriate:1}  Medications Ordered in ED Medications  albuterol (PROVENTIL,VENTOLIN) solution continuous neb (0 mg/hr Nebulization Hold 04/09/22 0806)  albuterol (PROVENTIL) (2.5 MG/3ML) 0.083% nebulizer solution (10 mg  Given 04/09/22 3149)    ED Course/ Medical Decision Making/ A&P Clinical Course as of 04/09/22 1030  Wed Apr 09, 2022  0824 DG Chest [facility] 1 View Chest x-ray with chronic findings.  No acute pneumonia [JK]  0912 CBC(!) White blood cell count elevated [JK]  0913 Comprehensive metabolic panel(!) No significant abnormalities [JK]    Clinical Course User Index [JK] Dorie Rank, MD                           Medical Decision Making Problems Addressed: COPD exacerbation [facility]): acute illness or injury that poses a threat to life or bodily functions  Amount and/or Complexity of Data Reviewed Labs: ordered. Decision-making details documented in ED Course. Radiology: ordered and independent interpretation performed. Decision-making details documented in ED Course.   Patient presented ED for evaluation of shortness of  breath.  Patient noted to have significant wheezing on exam.  No evidence of pneumonia.  COVID-negative.  No signs of CHF.  Suspect viral precipitation.  Patient is having some improvement but she continues to be tachycardic wheezing on exam despite hour-long nebulizer treatments steroids and magnesium.  Plan on consultation with medical service for admission for further treatment of a COPD exacerbation  {Document critical care time when appropriate:1} {Document review of labs and clinical decision tools ie heart score, Chads2Vasc2 etc:1}  {Document your independent review of radiology images, and any outside records:1} {Document your discussion with family members, caretakers, and with consultants:1} {Document social determinants of health affecting pt's care:1} {Document your decision making why or why not admission, treatments were needed:1} Final Clinical Impression(s) / ED Diagnoses Final diagnoses:  COPD exacerbation ([place])    Rx / DC Orders ED Discharge Orders     None

## 2022-04-09 NOTE — ED Notes (Signed)
RT notified that pt has order or Bipap

## 2022-04-09 NOTE — ED Triage Notes (Signed)
Wheezes throughout started wit SOB yesterday worsened throughout the night and into the morning unable to sleep on O2 2-3L: 69% on 4L on EMS arrival came up to 95% on NRB and neb treatment  166/118 123 HR  28 RR  2 duo neb 125 solumedrol  2 g mag given

## 2022-04-09 NOTE — Plan of Care (Signed)
Patient on CAT at [phone]. Placed on BiPAP 04/29/49% at 1240. Tolerating well.

## 2022-04-09 NOTE — H&P (Addendum)
History and Physical    Abigail Castillo WSF:[id number] DOB: 1950/10/19 DOA: 04/09/2022  PCP: [name], NP (Confirm with patient/family/NH records and if not entered, this has to be entered at [facility] point of entry) Patient coming from: Home  I have personally briefly reviewed patient's old medical records in [place]  Chief Complaint: Cough, SOB  HPI: Abigail Castillo is a 71 y.o. female with medical history significant of COPD Gold stage IV, chronic hypoxic respite failure on 2-3 liters continuously, moderate pulmonary hypertension, breast cancer status post left lumpectomy on hormone therapy, anxiety/depression, presented with increasing shortness of breath and cough.  Symptoms started 2 to 3 days ago, with new onset of stuffy nose runny nose, congestion and cough with clear phlegm no fever chills no chest pains.  Increasedly, dyspnea became worse could not sleep all night and came to ED.  ED Course: Patient was found to be very tachypneic and tachycardia, initially O2 saturation 98% on 6 L.  Chest x-ray showed severe COPD no acute infiltrates or consolidation.  Patient was started on IV Solu-Medrol magnesium x1, breathing effort, much improved.  Review of Systems: As per HPI otherwise 14 point review of systems negative.    Past Medical History:  Diagnosis Date   Acute on chronic respiratory failure with hypoxia ([place]) 07/05/2019   Anxiety    Blood transfusion    Blood transfusion without reported diagnosis    Breast cancer ([place]) 05/13/2021   Chronic mental illness    COPD (chronic obstructive pulmonary disease) ([place])    COVID-19 virus infection 07/06/2019   DEPRESSION 05/31/2009   Emphysema of lung ([place])    Esophageal stricture 03/14/2015   GERD (gastroesophageal reflux disease)    Hepatic steatosis    History of home oxygen therapy    History of pneumonia    Osteoporosis    Oxygen dependent    on O2 at 2-3L/min 24hr/day   Shortness of breath dyspnea     Tobacco abuse    ongoing   Vitamin B 12 deficiency    Vitamin D deficiency     Past Surgical History:  Procedure Laterality Date   ABDOMINAL HYSTERECTOMY     BREAST EXCISIONAL BIOPSY Left 2017   BREAST LUMPECTOMY  1972   left breast   BREAST LUMPECTOMY WITH RADIOACTIVE SEED LOCALIZATION Left 11/22/2015   Procedure: LEFT BREAST LUMPECTOMY WITH RADIOACTIVE SEED LOCALIZATION;  Surgeon: Erroll Luna, MD;  Location: [facility];  Service: General;  Laterality: Left;   BREAST LUMPECTOMY WITH RADIOACTIVE SEED LOCALIZATION Left 07/03/2021   Procedure: LEFT BREAST LUMPECTOMY WITH RADIOACTIVE SEED LOCALIZATION;  Surgeon: Erroll Luna, MD;  Location: [facility];  Service: General;  Laterality: Left;   BREAST SURGERY  ?2015   lumpectomy, left   CERVICAL [place]E BIOPSY     CHOLECYSTECTOMY     COLONOSCOPY     ESOPHAGOGASTRODUODENOSCOPY (EGD) WITH ESOPHAGEAL DILATION     UPPER GASTROINTESTINAL ENDOSCOPY       reports that she quit smoking about 10 years ago. Her smoking use included cigarettes. She has a 40.00 pack-year smoking history. She has been exposed to tobacco smoke. She has never used smokeless tobacco. She reports that she does not drink alcohol and does not use drugs.  Allergies  Allergen Reactions   Sulfonamide Derivatives Hives    Family History  Problem Relation Age of Onset   Coronary artery disease Mother    Diabetes Mother    Hypertension Mother    Coronary artery disease Father  Diabetes Father        Grandfather   Hypertension Father    Stroke Father    Other Father        colostomy bag   Breast cancer Sister    Breast cancer Sister    Diabetes Sister    Breast cancer Sister    Hypertension Sister    Mental illness Sister        x 4   Cancer Other        niece--breast   Esophageal cancer Neg Hx    Stomach cancer Neg Hx    Rectal cancer Neg Hx    Colon cancer Neg Hx    Colon polyps Neg Hx      Prior to Admission medications   Medication Sig Start Date End Date  Taking? Authorizing Provider  albuterol (VENTOLIN HFA) 108 (90 Base) MCG/ACT inhaler INHALE TWO PUFFS BY MOUTH INTO THE LUNGS EVERY 6 HOURS AS NEEDED FOR WHEEZING OR SHORTNESS OF BREATH Patient taking differently: Inhale 2 puffs into the lungs every 6 (six) hours as needed for wheezing or shortness of breath. 11/29/21  Yes Collene Gobble, MD  anastrozole (ARIMIDEX) 1 MG tablet TAKE ONE TABLET BY MOUTH DAILY 02/14/22  Yes Benay Pike, MD  Calcium Carbonate-Vitamin D 600-400 MG-UNIT tablet Take 1 tablet by mouth 2 (two) times daily. 02/22/21  Yes [name], NP  cholecalciferol (VITAMIN D) 1000 UNITS tablet Take 3,000 Units by mouth daily.    Yes [provider]  dexlansoprazole (DEXILANT) 60 MG capsule Take 1 capsule (60 mg total) by mouth daily. 10/28/21  Yes [name], NP  ibuprofen (ADVIL) 800 MG tablet Take 1 tablet (800 mg total) by mouth every 8 (eight) hours as needed. Patient taking differently: Take 800 mg by mouth every 8 (eight) hours as needed for fever, headache or mild pain. 07/03/21  Yes Cornett, Marcello Moores, MD  LORazepam (ATIVAN) 1 MG tablet Take 1 mg by mouth 2 (two) times daily. 09/07/17  Yes [provider]  OLANZapine (ZYPREXA) 5 MG tablet Take 7.5 mg by mouth daily.  06/18/16  Yes [provider]  OXYGEN Inhale 3 L into the lungs.   Yes [provider]  Tiotropium Bromide-Olodaterol (STIOLTO RESPIMAT) 2.5-2.5 MCG/ACT AERS INHALE TWO PUFFS INTO THE LUNGS DAILY Patient taking differently: Inhale 2 each into the lungs daily. INHALE TWO PUFFS INTO THE LUNGS DAILY 10/25/21  Yes Collene Gobble, MD  vitamin B-12 (CYANOCOBALAMIN) 1000 MCG tablet Take 1 tablet (1,000 mcg total) by mouth daily. 10/28/21  Yes [name], NP  vitamin E 180 MG (400 UNITS) capsule Take 400 Units by mouth daily.   Yes [provider]  Spacer/Aero-Holding Chambers (AEROCHAMBER MV) inhaler Use as instructed 03/13/17   Collene Gobble, MD    Physical  Exam: Vitals:   04/09/22 1030 04/09/22 1100 04/09/22 1130 04/09/22 1230  BP: (!) 153/95 (!) 144/97 (!) 136/96 (!) 167/146  Pulse: (!) 119 (!) 122 (!) 123 (!) 120  Resp: (!) 21 (!) '23 20 20  '$ Temp:      TempSrc:      SpO2: 98% 98% 98% 95%  Weight:      Height:        Constitutional: NAD, calm, comfortable Vitals:   04/09/22 1030 04/09/22 1100 04/09/22 1130 04/09/22 1230  BP: (!) 153/95 (!) 144/97 (!) 136/96 (!) 167/146  Pulse: (!) 119 (!) 122 (!) 123 (!) 120  Resp: (!) 21 (!) '23 20 20  '$ Temp:  TempSrc:      SpO2: 98% 98% 98% 95%  Weight:      Height:       Eyes: PERRL, lids and conjunctivae normal ENMT: Mucous membranes are moist. Posterior pharynx clear of any exudate or lesions.Normal dentition.  Neck: normal, supple, no masses, no thyromegaly Respiratory: Diminished breathing sound bilaterally, no wheezing, no crackles. Increasing respiratory effort, talking in broken sentences. No accessory muscle use.  Cardiovascular: Regular rate and rhythm, no murmurs / rubs / gallops. . 2+ pedal pulses. No carotid bruits.  Abdomen: no tenderness, no masses palpated. No hepatosplenomegaly. Bowel sounds positive.  Musculoskeletal: no clubbing / cyanosis. No joint deformity upper and lower extremities. Good ROM, no contractures. Normal muscle tone.  Skin: no rashes, lesions, ulcers. No induration Neurologic: CN 2-12 grossly intact. Sensation intact, DTR normal. Strength 5/5 in all 4.  Psychiatric: Normal judgment and insight. Alert and oriented x 3. Normal mood.    Labs on Admission: I have personally reviewed following labs and imaging studies  CBC: Recent Labs  Lab 04/09/22 0745  WBC 12.2*  HGB 13.6  HCT 42.7  MCV 96.4  PLT 174   Basic Metabolic Panel: Recent Labs  Lab 04/09/22 0745  NA 136  K 3.8  CL 100  CO2 23  GLUCOSE 181*  BUN 13  CREATININE 0.69  CALCIUM 8.1*   GFR: Estimated Creatinine Clearance: 58.3 mL/min (by C-G formula based on SCr of 0.69  mg/dL). Liver Function Tests: Recent Labs  Lab 04/09/22 0745  AST 25  ALT 25  ALKPHOS 104  BILITOT 1.0  PROT 6.2*  ALBUMIN 3.6   No results for input(s): "LIPASE", "AMYLASE" in the last 168 hours. No results for input(s): "AMMONIA" in the last 168 hours. Coagulation Profile: No results for input(s): "INR", "PROTIME" in the last 168 hours. Cardiac Enzymes: No results for input(s): "CKTOTAL", "CKMB", "CKMBINDEX", "TROPONINI" in the last 168 hours. BNP (last 3 results) No results for input(s): "PROBNP" in the last 8760 hours. HbA1C: No results for input(s): "HGBA1C" in the last 72 hours. CBG: No results for input(s): "GLUCAP" in the last 168 hours. Lipid Profile: No results for input(s): "CHOL", "HDL", "LDLCALC", "TRIG", "CHOLHDL", "LDLDIRECT" in the last 72 hours. Thyroid Function Tests: No results for input(s): "TSH", "T4TOTAL", "FREET4", "T3FREE", "THYROIDAB" in the last 72 hours. Anemia Panel: No results for input(s): "VITAMINB12", "FOLATE", "FERRITIN", "TIBC", "IRON", "RETICCTPCT" in the last 72 hours. Urine analysis:    Component Value Date/Time   COLORURINE AMBER (A) 07/06/2019 0712   APPEARANCEUR HAZY (A) 07/06/2019 0712   LABSPEC 1.034 (H) 07/06/2019 0712   PHURINE 5.0 07/06/2019 0712   GLUCOSEU 50 (A) 07/06/2019 0712   GLUCOSEU NEGATIVE 11/11/2017 1027   HGBUR NEGATIVE 07/06/2019 0712   BILIRUBINUR NEGATIVE 07/06/2019 0712   KETONESUR 20 (A) 07/06/2019 0712   PROTEINUR 30 (A) 07/06/2019 0712   UROBILINOGEN 0.2 11/11/2017 1027   NITRITE NEGATIVE 07/06/2019 0712   LEUKOCYTESUR NEGATIVE 07/06/2019 9449    Radiological Exams on Admission: DG Chest Port 1 View  Result Date: 04/09/2022 CLINICAL DATA:  Dyspnea, history breast cancer, COPD, emphysema, GERD, hepatic steatosis EXAM: PORTABLE CHEST 1 VIEW COMPARISON:  Portable exam 0750 hours compared to 07/05/2019 FINDINGS: Normal heart size and mediastinal contours. Prominent central pulmonary arteries question  pulmonary arterial hypertension. Severe emphysematous changes, greater on RIGHT. Bibasilar chronic interstitial lung disease changes again seen. No acute infiltrate, pleural effusion, or pneumothorax. Diffuse osseous demineralization. IMPRESSION: COPD changes with severe bibasilar chronic interstitial lung disease. Question  pulmonary arterial hypertension. No acute abnormalities or interval change. Electronically Signed   By: Lavonia Dana M.D.   On: 04/09/2022 07:58    EKG: Independently reviewed. Sinus tachy, no acute ST-T changes.  Assessment/Plan Principal Problem:   COPD (chronic obstructive pulmonary disease) ([place])  (please populate well all problems here in Problem List. (For example, if patient is on BP meds at home and you resume or decide to hold them, it is a problem that needs to be her. Same for CAD, COPD, HLD and so on)  Acute on chronic hypoxic respiratory failure -Secondary to acute COPD exacerbation -Probably has concurrent atypical infection, covered with doxycycline, check sputum culture, atypical study Legionella and mycoplasma and sputum culture -Solumedro x1 day switch to p.o. prednisone -Continue LABA inhaled steroid, iotrolan nebulizing and as needed DuoNeb -Incentive spirometry and flutter valve, wean down O2 to 93-94% -For now given the significant respiratory distress and risk of deterioration and fatigue, ordered BiPAP, explained to the patient and her daughter both agreed.  Elevated glucose -A1C=5.3 in June this year, outpatient A1C and PCP follow up.  Anxiety/depression -Continue home dose of Ativan, watch mentation avoid oversedation -Olanzapine  Moderate pulmonary hypertension -Euvolemic, may need as needed Lasix.  Breast disease status post lumpectomy -Continue anastrozole  DVT prophylaxis: Lovenox Code Status: Full code Family Communication: Daughter at bedside Disposition Plan: Patient is sick with significant severe COPD exacerbation hypoxia,  requiring BiPAP and IV steroids, expect more than 2 midnight hospital stay. Consults called: None Admission status: PCU   Lequita Halt MD [facility] Pager [phone]  04/09/2022, 1:17 PM

## 2022-04-09 NOTE — ED Notes (Signed)
RT at bedside.

## 2022-04-09 NOTE — ED Notes (Signed)
Placed patient on the bedpan patient is now off with family at bedside and call bell in reach

## 2022-04-10 DIAGNOSIS — J441 Chronic obstructive pulmonary disease with (acute) exacerbation: Secondary | ICD-10-CM | POA: Diagnosis not present

## 2022-04-10 DIAGNOSIS — E871 Hypo-osmolality and hyponatremia: Secondary | ICD-10-CM | POA: Insufficient documentation

## 2022-04-10 LAB — CBC
HCT: 44.1 % (ref 36.0–46.0)
Hemoglobin: 14.4 g/dL (ref 12.0–15.0)
MCH: 30.8 pg (ref 26.0–34.0)
MCHC: 32.7 g/dL (ref 30.0–36.0)
MCV: 94.4 fL (ref 80.0–100.0)
Platelets: 272 10*3/uL (ref 150–400)
RBC: 4.67 MIL/uL (ref 3.87–5.11)
RDW: 13.3 % (ref 11.5–15.5)
WBC: 11.8 10*3/uL — ABNORMAL HIGH (ref 4.0–10.5)
nRBC: 0 % (ref 0.0–0.2)

## 2022-04-10 LAB — MYCOPLASMA PNEUMONIAE ANTIBODY, IGM: Mycoplasma pneumo IgM: <770 U/mL (ref 0–769)

## 2022-04-10 LAB — BASIC METABOLIC PANEL
Anion gap: 13 (ref 5–15)
BUN: 15 mg/dL (ref 8–23)
CO2: 24 mmol/L (ref 22–32)
Calcium: 9.4 mg/dL (ref 8.9–10.3)
Chloride: 96 mmol/L — ABNORMAL LOW (ref 98–111)
Creatinine, Ser: 0.69 mg/dL (ref 0.44–1.00)
GFR, Estimated: >60 mL/min (ref >60)
Glucose, Bld: 147 mg/dL — ABNORMAL HIGH (ref 70–99)
Potassium: 4.5 mmol/L (ref 3.5–5.1)
Sodium: 133 mmol/L — ABNORMAL LOW (ref 135–145)

## 2022-04-10 LAB — GLUCOSE, CAPILLARY: Glucose-Capillary: 140 mg/dL — ABNORMAL HIGH (ref 70–99)

## 2022-04-10 MED ORDER — METHYLPREDNISOLONE SODIUM SUCC 40 MG IJ SOLR
40.0000 mg | Freq: Two times a day (BID) | INTRAMUSCULAR | Status: DC
  Administered 2022-04-10 – 2022-04-14 (×10): 40 mg via INTRAVENOUS
  Filled 2022-04-10 (×10): qty 1

## 2022-04-10 MED ORDER — IPRATROPIUM-ALBUTEROL 0.5-2.5 (3) MG/3ML IN SOLN
3.0000 mL | Freq: Four times a day (QID) | RESPIRATORY_TRACT | Status: DC
  Administered 2022-04-10 (×2): 3 mL via RESPIRATORY_TRACT
  Filled 2022-04-10 (×3): qty 3

## 2022-04-10 MED ORDER — ALBUTEROL SULFATE (2.5 MG/3ML) 0.083% IN NEBU
2.5000 mg | INHALATION_SOLUTION | Freq: Four times a day (QID) | RESPIRATORY_TRACT | Status: DC
  Filled 2022-04-10: qty 3

## 2022-04-10 MED ORDER — IPRATROPIUM-ALBUTEROL 0.5-2.5 (3) MG/3ML IN SOLN
3.0000 mL | Freq: Two times a day (BID) | RESPIRATORY_TRACT | Status: DC
  Administered 2022-04-11 – 2022-04-14 (×6): 3 mL via RESPIRATORY_TRACT
  Filled 2022-04-10 (×7): qty 3

## 2022-04-10 MED ORDER — IPRATROPIUM BROMIDE 0.02 % IN SOLN: 0.5000 mg | Freq: Four times a day (QID) | RESPIRATORY_TRACT | Status: DC

## 2022-04-10 NOTE — Progress Notes (Signed)
Pt off bipap at this time. Currently on 4L nasal cannula. No distress noted. RT will continue to monitor and be available as needed.

## 2022-04-10 NOTE — Progress Notes (Addendum)
PROGRESS NOTE    Abigail Castillo  EGB:[id number] DOB: 25-Jun-1950 DOA: 04/09/2022 PCP: [name], NP   Brief Narrative: 71 past medical history significant for COPD stage IV, chronic hypoxic respiratory failure 2 to 3 L, moderate pulmonary hypertension, breast cancer status post left lumpectomy and hormone therapy, anxiety, depression presented with worsening shortness of breath and cough symptoms started 2 or 3 days prior to admission.  She was noted to be hypoxic requiring 6 L of oxygen.  She received IV Solu-Medrol in the ED.  Patient oxygen saturation dropped to the 70s this morning and she was in respiratory distress she was placed on BiPAP.     Assessment & Plan: Principal Problem:   COPD (chronic obstructive pulmonary disease) (HCC) Active Problems:   Depression   GERD (gastroesophageal reflux disease)   Acute on chronic respiratory failure with hypoxia (HCC)   Ductal carcinoma in situ (DCIS) of left breast   Hyponatremia  1-Acute on Chronic Hypoxic Respiratory Failure in the setting of acute COPD exacerbation Chest x ray: COPD changes with severe bibasilar chronic interstitial lung disease.  Question pulmonary arterial hypertension. -Continue with Solu-Medrol -Continue with BiPAP -Pulmonologist consulted.  -Continue with doxycycline -Continue with Brovana and Incruse.  Scheduled nebulizer.  Continue with guaifenesin  Anxiety,  depression: Continue with Ativan Continue with olanzapine  Pulmonary hypertension: Pulmonology consulted BNP 22.   History of breast cancer status postlumpectomy: Continue with anastrozole  Mild Hyponatremia; monitor.  Elevation CBG; suspect in setting steroids. Monitor.  GERD; PPI  Estimated body mass index is 26.21 kg/m as calculated from the following:   Height as of this encounter: '5\' 2"'$  (1.575 m).   Weight as of this encounter: 65 kg.   DVT prophylaxis: Lovenox Code Status: Full code Family Communication:no family at  bedside Disposition Plan:  Status is: Inpatient Remains inpatient appropriate because: management of Resp failure, COPD exacerbation.     Consultants:  Pulmonologist   Procedures:    Antimicrobials:    Subjective: She is back on BIPAP, she desat to 70 in AM, and increased work of breathing.  She report cough.   Objective: Vitals:   04/10/22 1200 04/10/22 1240 04/10/22 1242 04/10/22 1427  BP:  123/80    Pulse: (!) 104 (!) 105    Resp: 20 19    Temp:      TempSrc:      SpO2: 95% 93%  93%  Weight:   65 kg   Height:   '5\' 2"'$  (1.575 m)    No intake or output data in the 24 hours ending 04/10/22 1647 Filed Weights   04/09/22 0734 04/10/22 1242  Weight: 65.8 kg 65 kg    Examination:  General exam: Appears calm and comfortable  Respiratory system: Decreased breath sounds, BL wheezing.  Cardiovascular system: S1 & S2 heard, RRR.  Gastrointestinal system: Abdomen is nondistended, soft and nontender. No organomegaly or masses felt. Normal bowel sounds heard. Central nervous system: Alert and oriented. No focal neurological deficits. Extremities: Symmetric 5 x 5 power. Skin: No rashes, lesions or ulcers Psychiatry: Judgement and insight appear normal. Mood & affect appropriate.     Data Reviewed: I have personally reviewed following labs and imaging studies  CBC: Recent Labs  Lab 04/09/22 0745 04/10/22 0220  WBC 12.2* 11.8*  HGB 13.6 14.4  HCT 42.7 44.1  MCV 96.4 94.4  PLT 215 371   Basic Metabolic Panel: Recent Labs  Lab 04/09/22 0745 04/10/22 0220  NA 136 133*  K 3.8  4.5  CL 100 96*  CO2 23 24  GLUCOSE 181* 147*  BUN 13 15  CREATININE 0.69 0.69  CALCIUM 8.1* 9.4   GFR: Estimated Creatinine Clearance: 58 mL/min (by C-G formula based on SCr of 0.69 mg/dL). Liver Function Tests: Recent Labs  Lab 04/09/22 0745  AST 25  ALT 25  ALKPHOS 104  BILITOT 1.0  PROT 6.2*  ALBUMIN 3.6   No results for input(s): "LIPASE", "AMYLASE" in the last 168  hours. No results for input(s): "AMMONIA" in the last 168 hours. Coagulation Profile: No results for input(s): "INR", "PROTIME" in the last 168 hours. Cardiac Enzymes: No results for input(s): "CKTOTAL", "CKMB", "CKMBINDEX", "TROPONINI" in the last 168 hours. BNP (last 3 results) No results for input(s): "PROBNP" in the last 8760 hours. HbA1C: No results for input(s): "HGBA1C" in the last 72 hours. CBG: No results for input(s): "GLUCAP" in the last 168 hours. Lipid Profile: No results for input(s): "CHOL", "HDL", "LDLCALC", "TRIG", "CHOLHDL", "LDLDIRECT" in the last 72 hours. Thyroid Function Tests: No results for input(s): "TSH", "T4TOTAL", "FREET4", "T3FREE", "THYROIDAB" in the last 72 hours. Anemia Panel: No results for input(s): "VITAMINB12", "FOLATE", "FERRITIN", "TIBC", "IRON", "RETICCTPCT" in the last 72 hours. Sepsis Labs: No results for input(s): "PROCALCITON", "LATICACIDVEN" in the last 168 hours.  Recent Results (from the past 240 hour(s))  Resp Panel by RT-PCR (Flu A&B, Covid) Anterior Nasal Swab     Status: None   Collection Time: 04/09/22  7:45 AM   Specimen: Anterior Nasal Swab  Result Value Ref Range Status   SARS Coronavirus 2 by RT PCR NEGATIVE NEGATIVE Final    Comment: (NOTE) SARS-CoV-2 target nucleic acids are NOT DETECTED.  The SARS-CoV-2 RNA is generally detectable in upper respiratory specimens during the acute phase of infection. The lowest concentration of SARS-CoV-2 viral copies this assay can detect is 138 copies/mL. A negative result does not preclude SARS-Cov-2 infection and should not be used as the sole basis for treatment or other patient management decisions. A negative result may occur with  improper specimen collection/handling, submission of specimen other than nasopharyngeal swab, presence of viral mutation(s) within the areas targeted by this assay, and inadequate number of viral copies(<138 copies/mL). A negative result must be combined  with clinical observations, patient history, and epidemiological information. The expected result is Negative.  Fact Sheet for Patients:  EntrepreneurPulse.com.au  Fact Sheet for Healthcare Providers:  IncredibleEmployment.be  This test is no t yet approved or cleared by the [place] FDA and  has been authorized for detection and/or diagnosis of SARS-CoV-2 by FDA under an Emergency Use Authorization (EUA). This EUA will remain  in effect (meaning this test can be used) for the duration of the COVID-19 declaration under Section 564(b)(1) of the Act, 21 U.S.C.section 360bbb-3(b)(1), unless the authorization is terminated  or revoked sooner.       Influenza A by PCR NEGATIVE NEGATIVE Final   Influenza B by PCR NEGATIVE NEGATIVE Final    Comment: (NOTE) The Xpert Xpress SARS-CoV-2/FLU/RSV plus assay is intended as an aid in the diagnosis of influenza from Nasopharyngeal swab specimens and should not be used as a sole basis for treatment. Nasal washings and aspirates are unacceptable for Xpert Xpress SARS-CoV-2/FLU/RSV testing.  Fact Sheet for Patients: EntrepreneurPulse.com.au  Fact Sheet for Healthcare Providers: IncredibleEmployment.be  This test is not yet approved or cleared by the [place] FDA and has been authorized for detection and/or diagnosis of SARS-CoV-2 by FDA under an Emergency Use Authorization (EUA).  This EUA will remain in effect (meaning this test can be used) for the duration of the COVID-19 declaration under Section 564(b)(1) of the Act, 21 U.S.C. section 360bbb-3(b)(1), unless the authorization is terminated or revoked.  Performed at [facility], [place] [address], [place], [place] [address]          Radiology Studies: DG Chest Port 1 View  Result Date: 04/09/2022 CLINICAL DATA:  Dyspnea, history breast cancer, COPD, emphysema, GERD, hepatic steatosis EXAM:  PORTABLE CHEST 1 VIEW COMPARISON:  Portable exam 0750 hours compared to 07/05/2019 FINDINGS: Normal heart size and mediastinal contours. Prominent central pulmonary arteries question pulmonary arterial hypertension. Severe emphysematous changes, greater on RIGHT. Bibasilar chronic interstitial lung disease changes again seen. No acute infiltrate, pleural effusion, or pneumothorax. Diffuse osseous demineralization. IMPRESSION: COPD changes with severe bibasilar chronic interstitial lung disease. Question pulmonary arterial hypertension. No acute abnormalities or interval change. Electronically Signed   By: Lavonia Dana M.D.   On: 04/09/2022 07:58        Scheduled Meds:  albuterol  10 mg/hr Nebulization Once   anastrozole  1 mg Oral Daily   arformoterol  15 mcg Nebulization BID   And   umeclidinium bromide  1 puff Inhalation Daily   doxycycline  100 mg Oral Q12H   enoxaparin (LOVENOX) injection  40 mg Subcutaneous Q24H   guaiFENesin  1,200 mg Oral BID   ipratropium-albuterol  3 mL Nebulization Q6H   methylPREDNISolone (SOLU-MEDROL) injection  40 mg Intravenous Q12H   OLANZapine  7.5 mg Oral Daily   pantoprazole  40 mg Oral Daily   Continuous Infusions:   LOS: 1 day    Time spent: 35 minutes    Aubrey Blackard A Semisi Biela, MD [facility]   If 7PM-7AM, please contact night-coverage [email or web address]  04/10/2022, 4:47 PM

## 2022-04-10 NOTE — Consult Note (Signed)
NAME:  Abigail Castillo, MRN:  [id number], DOB:  1950-08-04, LOS: 1 ADMISSION DATE:  04/09/2022, CONSULTATION DATE: April 10, 2022 REFERRING MD: Tyrell Antonio, CHIEF COMPLAINT: Dyspnea  History of Present Illness:  71 year old female with COPD, chronic respiratory failure with hypoxemia who follows with Dr. Lamonte Sakai presented for shortness of breath to the emergency department yesterday.  She says that last week she got a cold and she has been having allergies.  She had significant postnasal drip and sinus congestion.  She has had some cough but not much mucus production.  Her primary complaint yesterday was shortness of breath.  She denies leg swelling.  She has not had hemoptysis or chest pain.  In the emergency department she has been treated with bronchodilators, steroids, and required noninvasive mechanical ventilation.  The noninvasive mechanical ventilation was just stopped and she says she is feeling a bit better.  She is currently on 4 L of oxygen, she uses 2-3 at home.  She says that she quit smoking several years ago.    Pertinent  Medical History   Past Medical History:  Diagnosis Date   Acute on chronic respiratory failure with hypoxia ([place]) 07/05/2019   Anxiety    Blood transfusion    Blood transfusion without reported diagnosis    Breast cancer ([place]) 05/13/2021   Chronic mental illness    COPD (chronic obstructive pulmonary disease) ([place])    COVID-19 virus infection 07/06/2019   DEPRESSION 05/31/2009   Emphysema of lung (HCC)    Esophageal stricture 03/14/2015   GERD (gastroesophageal reflux disease)    Hepatic steatosis    History of home oxygen therapy    History of pneumonia    Osteoporosis    Oxygen dependent    on O2 at 2-3L/min 24hr/day   Shortness of breath dyspnea    Tobacco abuse    ongoing   Vitamin B 12 deficiency    Vitamin D deficiency      Significant Hospital Events: Including procedures, antibiotic start and stop dates in addition to other pertinent  events   April 09, 2022 respiratory viral panel negative for flu and COVID November 2015 chest x-ray images independently reviewed showing emphysema, interstitial changes in bases  Interim History / Subjective:  As Above  Objective   Blood pressure (!) 143/105, pulse (!) 117, temperature 98.5 F (36.9 C), temperature source Oral, resp. rate (!) 24, height '5\' 2"'$  (1.575 m), weight 65.8 kg, SpO2 95 %.       No intake or output data in the 24 hours ending 04/10/22 1047 Filed Weights   04/09/22 0734  Weight: 65.8 kg    Examination:  General:  Resting comfortably in bed HENT: NCAT OP clear PULM: Poor air movement B, normal effort CV: RRR, no mgr GI: BS+, soft, nontender MSK: normal bulk and tone Neuro: awake, alert, no distress, MAEW   Resolved Hospital Problem list     Assessment & Plan:  COPD with acute exacerbation Acute respiratory failure with hypoxemia Chronic respiratory failure with hypoxemia  Discussion: Severe COPD exacerbation requiring noninvasive mechanical ventilation.  Likely started with a viral illness a week ago.  Now improving with steroids and bronchodilators.  Plan: Continue Solu-Medrol 40 mg every 12 hours Continue Brovana, increase Continue albuterol as scheduled Wean off oxygen for O2 saturation greater than 88%  We will follow  Best Practice (right click and "Reselect all SmartList Selections" daily)     Labs   CBC: Recent Labs  Lab 04/09/22 0745 04/10/22 0220  WBC 12.2* 11.8*  HGB 13.6 14.4  HCT 42.7 44.1  MCV 96.4 94.4  PLT 215 086    Basic Metabolic Panel: Recent Labs  Lab 04/09/22 0745 04/10/22 0220  NA 136 133*  K 3.8 4.5  CL 100 96*  CO2 23 24  GLUCOSE 181* 147*  BUN 13 15  CREATININE 0.69 0.69  CALCIUM 8.1* 9.4   GFR: Estimated Creatinine Clearance: 58.3 mL/min (by C-G formula based on SCr of 0.69 mg/dL). Recent Labs  Lab 04/09/22 0745 04/10/22 0220  WBC 12.2* 11.8*    Liver Function Tests: Recent  Labs  Lab 04/09/22 0745  AST 25  ALT 25  ALKPHOS 104  BILITOT 1.0  PROT 6.2*  ALBUMIN 3.6   No results for input(s): "LIPASE", "AMYLASE" in the last 168 hours. No results for input(s): "AMMONIA" in the last 168 hours.  ABG    Component Value Date/Time   PHART 7.339 (L) 02/05/2010 1540   PCO2ART 64.2 (HH) 02/05/2010 1540   PO2ART 107.0 (H) 02/05/2010 1540   HCO3 34.5 (H) 02/05/2010 1540   TCO2 36 02/05/2010 1540   O2SAT 98.0 02/05/2010 1540     Coagulation Profile: No results for input(s): "INR", "PROTIME" in the last 168 hours.  Cardiac Enzymes: No results for input(s): "CKTOTAL", "CKMB", "CKMBINDEX", "TROPONINI" in the last 168 hours.  HbA1C: Hgb A1c MFr Bld  Date/Time Value Ref Range Status  10/28/2021 08:41 AM 5.3 4.6 - 6.5 % Final    Comment:    Glycemic Control Guidelines for People with Diabetes:Non Diabetic:  <6%Goal of Therapy: <7%Additional Action Suggested:  >8%   02/03/2014 10:23 AM 5.4 4.6 - 6.5 % Final    Comment:    Glycemic Control Guidelines for People with Diabetes:Non Diabetic:  <6%Goal of Therapy: <7%Additional Action Suggested:  >8%     CBG: No results for input(s): "GLUCAP" in the last 168 hours.  Review of Systems:   Gen: Denies fever, chills, weight change, fatigue, night sweats HEENT: Denies blurred vision, double vision, hearing loss, tinnitus, sinus congestion, rhinorrhea, sore throat, neck stiffness, dysphagia PULM: per HPI CV: Denies chest pain, edema, orthopnea, paroxysmal nocturnal dyspnea, palpitations GI: Denies abdominal pain, nausea, vomiting, diarrhea, hematochezia, melena, constipation, change in bowel habits GU: Denies dysuria, hematuria, polyuria, oliguria, urethral discharge Endocrine: Denies hot or cold intolerance, polyuria, polyphagia or appetite change Derm: Denies rash, dry skin, scaling or peeling skin change Heme: Denies easy bruising, bleeding, bleeding gums Neuro: Denies headache, numbness, weakness, slurred speech,  loss of memory or consciousness   Past Medical History:  She,  has a past medical history of Acute on chronic respiratory failure with hypoxia ([place]) (07/05/2019), Anxiety, Blood transfusion, Blood transfusion without reported diagnosis, Breast cancer ([place]) (05/13/2021), Chronic mental illness, COPD (chronic obstructive pulmonary disease) ([place]), COVID-19 virus infection (07/06/2019), DEPRESSION (05/31/2009), Emphysema of lung ([place]), Esophageal stricture (03/14/2015), GERD (gastroesophageal reflux disease), Hepatic steatosis, History of home oxygen therapy, History of pneumonia, Osteoporosis, Oxygen dependent, Shortness of breath dyspnea, Tobacco abuse, Vitamin B 12 deficiency, and Vitamin D deficiency.   Surgical History:   Past Surgical History:  Procedure Laterality Date   ABDOMINAL HYSTERECTOMY     BREAST EXCISIONAL BIOPSY Left 2017   BREAST LUMPECTOMY  1972   left breast   BREAST LUMPECTOMY WITH RADIOACTIVE SEED LOCALIZATION Left 11/22/2015   Procedure: LEFT BREAST LUMPECTOMY WITH RADIOACTIVE SEED LOCALIZATION;  Surgeon: Erroll Luna, MD;  Location: [facility];  Service: General;  Laterality: Left;   BREAST LUMPECTOMY WITH RADIOACTIVE SEED LOCALIZATION  Left 07/03/2021   Procedure: LEFT BREAST LUMPECTOMY WITH RADIOACTIVE SEED LOCALIZATION;  Surgeon: Erroll Luna, MD;  Location: [facility];  Service: General;  Laterality: Left;   BREAST SURGERY  ?2015   lumpectomy, left   CERVICAL [place]E BIOPSY     CHOLECYSTECTOMY     COLONOSCOPY     ESOPHAGOGASTRODUODENOSCOPY (EGD) WITH ESOPHAGEAL DILATION     UPPER GASTROINTESTINAL ENDOSCOPY       Social History:   reports that she quit smoking about 10 years ago. Her smoking use included cigarettes. She has a 40.00 pack-year smoking history. She has been exposed to tobacco smoke. She has never used smokeless tobacco. She reports that she does not drink alcohol and does not use drugs.   Family History:  Her family history includes Breast cancer in her sister,  sister, and sister; Cancer in an other family member; Coronary artery disease in her father and mother; Diabetes in her father, mother, and sister; Hypertension in her father, mother, and sister; Mental illness in her sister; Other in her father; Stroke in her father. There is no history of Esophageal cancer, Stomach cancer, Rectal cancer, Colon cancer, or Colon polyps.   Allergies Allergies  Allergen Reactions   Sulfonamide Derivatives Hives     Home Medications  Prior to Admission medications   Medication Sig Start Date End Date Taking? Authorizing Provider  albuterol (VENTOLIN HFA) 108 (90 Base) MCG/ACT inhaler INHALE TWO PUFFS BY MOUTH INTO THE LUNGS EVERY 6 HOURS AS NEEDED FOR WHEEZING OR SHORTNESS OF BREATH Patient taking differently: Inhale 2 puffs into the lungs every 6 (six) hours as needed for wheezing or shortness of breath. 11/29/21  Yes Collene Gobble, MD  anastrozole (ARIMIDEX) 1 MG tablet TAKE ONE TABLET BY MOUTH DAILY 02/14/22  Yes Benay Pike, MD  Calcium Carbonate-Vitamin D 600-400 MG-UNIT tablet Take 1 tablet by mouth 2 (two) times daily. 02/22/21  Yes [name], NP  cholecalciferol (VITAMIN D) 1000 UNITS tablet Take 3,000 Units by mouth daily.    Yes [provider]  dexlansoprazole (DEXILANT) 60 MG capsule Take 1 capsule (60 mg total) by mouth daily. 10/28/21  Yes [name], NP  ibuprofen (ADVIL) 800 MG tablet Take 1 tablet (800 mg total) by mouth every 8 (eight) hours as needed. Patient taking differently: Take 800 mg by mouth every 8 (eight) hours as needed for fever, headache or mild pain. 07/03/21  Yes Cornett, Marcello Moores, MD  LORazepam (ATIVAN) 1 MG tablet Take 1 mg by mouth 2 (two) times daily. 09/07/17  Yes [provider]  OLANZapine (ZYPREXA) 5 MG tablet Take 7.5 mg by mouth daily.  06/18/16  Yes [provider]  OXYGEN Inhale 3 L into the lungs.   Yes [provider]  Tiotropium Bromide-Olodaterol (STIOLTO RESPIMAT)  2.5-2.5 MCG/ACT AERS INHALE TWO PUFFS INTO THE LUNGS DAILY Patient taking differently: Inhale 2 each into the lungs daily. INHALE TWO PUFFS INTO THE LUNGS DAILY 10/25/21  Yes Collene Gobble, MD  vitamin B-12 (CYANOCOBALAMIN) 1000 MCG tablet Take 1 tablet (1,000 mcg total) by mouth daily. 10/28/21  Yes [name], NP  vitamin E 180 MG (400 UNITS) capsule Take 400 Units by mouth daily.   Yes [provider]  Spacer/Aero-Holding Chambers (AEROCHAMBER MV) inhaler Use as instructed 03/13/17   Collene Gobble, MD     Critical care time: n/a     Roselie Awkward, MD [facility] [place] [facility] Pager: [phone] Cell: [phone] After 7:00 pm call Elink  [phone]

## 2022-04-10 NOTE — ED Notes (Signed)
ED TO INPATIENT HANDOFF REPORT  ED Nurse Name and Phone #:   S Name/Age/Gender Abigail Castillo 71 y.o. female Room/Bed: 010C/010C  Code Status   Code Status: Full Code  Home/SNF/Other Home Patient oriented to: self, place, time, and situation Is this baseline? Yes   Triage Complete: Triage complete  Chief Complaint COPD (chronic obstructive pulmonary disease) ([employer]) [J44.9]  Triage Note Wheezes throughout started wit SOB yesterday worsened throughout the night and into the morning unable to sleep on O2 2-3L: 69% on 4L on EMS arrival came up to 95% on NRB and neb treatment  166/118 123 HR  28 RR  2 duo neb 125 solumedrol  2 g mag given   Allergies Allergies  Allergen Reactions   Sulfonamide Derivatives Hives    Level of Care/Admitting Diagnosis ED Disposition     ED Disposition  Admit   Condition  --   [place]: [place] [[id number]]  Level of Care: Progressive [102]  Admit to Progressive based on following criteria: RESPIRATORY PROBLEMS hypoxemic/hypercapnic respiratory failure that is responsive to NIPPV (BiPAP) or High Flow Nasal Cannula (6-80 lpm). Frequent assessment/intervention, no > Q2 hrs < Q4 hrs, to maintain oxygenation and pulmonary hygiene.  May admit patient to [facility] or [facility] if equivalent level of care is available:: No  Covid Evaluation: Asymptomatic - no recent exposure (last 10 days) testing not required  Diagnosis: COPD (chronic obstructive pulmonary disease) [facility]) [338250]  Admitting Physician: Lequita Halt [[id number]]  Attending Physician: Lequita Halt [[id number]]  Certification:: I certify this patient will need inpatient services for at least 2 midnights  Estimated Length of Stay: 2          B Medical/Surgery History Past Medical History:  Diagnosis Date   Acute on chronic respiratory failure with hypoxia ([place]) 07/05/2019   Anxiety    Blood transfusion    Blood transfusion without  reported diagnosis    Breast cancer ([place]) 05/13/2021   Chronic mental illness    COPD (chronic obstructive pulmonary disease) ([place])    COVID-19 virus infection 07/06/2019   DEPRESSION 05/31/2009   Emphysema of lung ([place])    Esophageal stricture 03/14/2015   GERD (gastroesophageal reflux disease)    Hepatic steatosis    History of home oxygen therapy    History of pneumonia    Osteoporosis    Oxygen dependent    on O2 at 2-3L/min 24hr/day   Shortness of breath dyspnea    Tobacco abuse    ongoing   Vitamin B 12 deficiency    Vitamin D deficiency    Past Surgical History:  Procedure Laterality Date   ABDOMINAL HYSTERECTOMY     BREAST EXCISIONAL BIOPSY Left 2017   BREAST LUMPECTOMY  1972   left breast   BREAST LUMPECTOMY WITH RADIOACTIVE SEED LOCALIZATION Left 11/22/2015   Procedure: LEFT BREAST LUMPECTOMY WITH RADIOACTIVE SEED LOCALIZATION;  Surgeon: Erroll Luna, MD;  Location: [place];  Service: General;  Laterality: Left;   BREAST LUMPECTOMY WITH RADIOACTIVE SEED LOCALIZATION Left 07/03/2021   Procedure: LEFT BREAST LUMPECTOMY WITH RADIOACTIVE SEED LOCALIZATION;  Surgeon: Erroll Luna, MD;  Location: [place];  Service: General;  Laterality: Left;   BREAST SURGERY  ?2015   lumpectomy, left   CERVICAL CONE BIOPSY     CHOLECYSTECTOMY     COLONOSCOPY     ESOPHAGOGASTRODUODENOSCOPY (EGD) WITH ESOPHAGEAL DILATION     UPPER GASTROINTESTINAL ENDOSCOPY       A IV Location/Drains/Wounds  Patient Lines/Drains/Airways Status     Active Line/Drains/Airways     Name Placement date Placement time Site Days   Peripheral IV 04/09/22 20 G Right Hand 04/09/22  0720  Hand  1   Incision (Closed) 07/03/21 Breast Left 07/03/21  0931  -- 281            Intake/Output Last 24 hours No intake or output data in the 24 hours ending 04/10/22 1120  Labs/Imaging Results for orders placed or performed during the hospital encounter of 04/09/22 (from the past 48 hour(s))  Resp Panel by RT-PCR  (Flu A&B, Covid) Anterior Nasal Swab     Status: None   Collection Time: 04/09/22  7:45 AM   Specimen: Anterior Nasal Swab  Result Value Ref Range   SARS Coronavirus 2 by RT PCR NEGATIVE NEGATIVE    Comment: (NOTE) SARS-CoV-2 target nucleic acids are NOT DETECTED.  The SARS-CoV-2 RNA is generally detectable in upper respiratory specimens during the acute phase of infection. The lowest concentration of SARS-CoV-2 viral copies this assay can detect is 138 copies/mL. A negative result does not preclude SARS-Cov-2 infection and should not be used as the sole basis for treatment or other patient management decisions. A negative result may occur with  improper specimen collection/handling, submission of specimen other than nasopharyngeal swab, presence of viral mutation(s) within the areas targeted by this assay, and inadequate number of viral copies(<138 copies/mL). A negative result must be combined with clinical observations, patient history, and epidemiological information. The expected result is Negative.  Fact Sheet for Patients:  EntrepreneurPulse.com.au  Fact Sheet for Healthcare Providers:  IncredibleEmployment.be  This test is no t yet approved or cleared by the [place] FDA and  has been authorized for detection and/or diagnosis of SARS-CoV-2 by FDA under an Emergency Use Authorization (EUA). This EUA will remain  in effect (meaning this test can be used) for the duration of the COVID-19 declaration under Section 564(b)(1) of the Act, 21 U.S.C.section 360bbb-3(b)(1), unless the authorization is terminated  or revoked sooner.       Influenza A by PCR NEGATIVE NEGATIVE   Influenza B by PCR NEGATIVE NEGATIVE    Comment: (NOTE) The Xpert Xpress SARS-CoV-2/FLU/RSV plus assay is intended as an aid in the diagnosis of influenza from Nasopharyngeal swab specimens and should not be used as a sole basis for treatment. Nasal washings  and aspirates are unacceptable for Xpert Xpress SARS-CoV-2/FLU/RSV testing.  Fact Sheet for Patients: EntrepreneurPulse.com.au  Fact Sheet for Healthcare Providers: IncredibleEmployment.be  This test is not yet approved or cleared by the [place] FDA and has been authorized for detection and/or diagnosis of SARS-CoV-2 by FDA under an Emergency Use Authorization (EUA). This EUA will remain in effect (meaning this test can be used) for the duration of the COVID-19 declaration under Section 564(b)(1) of the Act, 21 U.S.C. section 360bbb-3(b)(1), unless the authorization is terminated or revoked.  Performed at [facility], [place] [address], [place], [place] [address]   Comprehensive metabolic panel     Status: Abnormal   Collection Time: 04/09/22  7:45 AM  Result Value Ref Range   Sodium 136 135 - 145 mmol/L   Potassium 3.8 3.5 - 5.1 mmol/L   Chloride 100 98 - 111 mmol/L   CO2 23 22 - 32 mmol/L   Glucose, Bld 181 (H) 70 - 99 mg/dL    Comment: Glucose reference range applies only to samples taken after fasting for at least 8 hours.  BUN 13 8 - 23 mg/dL   Creatinine, Ser 0.69 0.44 - 1.00 mg/dL   Calcium 8.1 (L) 8.9 - 10.3 mg/dL   Total Protein 6.2 (L) 6.5 - 8.1 g/dL   Albumin 3.6 3.5 - 5.0 g/dL   AST 25 15 - 41 U/L   ALT 25 0 - 44 U/L   Alkaline Phosphatase 104 38 - 126 U/L   Total Bilirubin 1.0 0.3 - 1.2 mg/dL   GFR, Estimated >60 >60 mL/min    Comment: (NOTE) Calculated using the CKD-EPI Creatinine Equation (2021)    Anion gap 13 5 - 15    Comment: Performed at [facility], [place] [address], [place], [place] [address]  CBC     Status: Abnormal   Collection Time: 04/09/22  7:45 AM  Result Value Ref Range   WBC 12.2 (H) 4.0 - 10.5 K/uL   RBC 4.43 3.87 - 5.11 MIL/uL   Hemoglobin 13.6 12.0 - 15.0 g/dL   HCT 42.7 36.0 - 46.0 %   MCV 96.4 80.0 - 100.0 fL   MCH 30.7 26.0 - 34.0 pg   MCHC 31.9 30.0 - 36.0 g/dL   RDW 12.8  11.5 - 15.5 %   Platelets 215 150 - 400 K/uL   nRBC 0.0 0.0 - 0.2 %    Comment: Performed at [facility], [place] [address], [place], [place] [address]  Brain natriuretic peptide     Status: None   Collection Time: 04/09/22  7:45 AM  Result Value Ref Range   B Natriuretic Peptide 22.7 0.0 - 100.0 pg/mL    Comment: Performed at [facility] [address], [place], [place] [address]  HIV Antibody (routine testing w rflx)     Status: None   Collection Time: 04/09/22  6:07 PM  Result Value Ref Range   HIV Screen 4th Generation wRfx Non Reactive Non Reactive    Comment: Performed at [facility], [place] [address] [place], [place] [address]  CBC     Status: Abnormal   Collection Time: 04/10/22  2:20 AM  Result Value Ref Range   WBC 11.8 (H) 4.0 - 10.5 K/uL   RBC 4.67 3.87 - 5.11 MIL/uL   Hemoglobin 14.4 12.0 - 15.0 g/dL   HCT 44.1 36.0 - 46.0 %   MCV 94.4 80.0 - 100.0 fL   MCH 30.8 26.0 - 34.0 pg   MCHC 32.7 30.0 - 36.0 g/dL   RDW 13.3 11.5 - 15.5 %   Platelets 272 150 - 400 K/uL   nRBC 0.0 0.0 - 0.2 %    Comment: Performed at [facility], [place] [address] [place], [place] [address]  Basic metabolic panel     Status: Abnormal   Collection Time: 04/10/22  2:20 AM  Result Value Ref Range   Sodium 133 (L) 135 - 145 mmol/L   Potassium 4.5 3.5 - 5.1 mmol/L   Chloride 96 (L) 98 - 111 mmol/L   CO2 24 22 - 32 mmol/L   Glucose, Bld 147 (H) 70 - 99 mg/dL    Comment: Glucose reference range applies only to samples taken after fasting for at least 8 hours.   BUN 15 8 - 23 mg/dL   Creatinine, Ser 0.69 0.44 - 1.00 mg/dL   Calcium 9.4 8.9 - 10.3 mg/dL   GFR, Estimated >60 >60 mL/min    Comment: (NOTE) Calculated using the CKD-EPI Creatinine Equation (2021)    Anion gap 13  5 - 15    Comment: Performed at [facility], [place] [address], [place], [place] [address]   DG Chest Port 1 View  Result Date: 04/09/2022 CLINICAL DATA:  Dyspnea, history breast cancer, COPD,  emphysema, GERD, hepatic steatosis EXAM: PORTABLE CHEST 1 VIEW COMPARISON:  Portable exam 0750 hours compared to 07/05/2019 FINDINGS: Normal heart size and mediastinal contours. Prominent central pulmonary arteries question pulmonary arterial hypertension. Severe emphysematous changes, greater on RIGHT. Bibasilar chronic interstitial lung disease changes again seen. No acute infiltrate, pleural effusion, or pneumothorax. Diffuse osseous demineralization. IMPRESSION: COPD changes with severe bibasilar chronic interstitial lung disease. Question pulmonary arterial hypertension. No acute abnormalities or interval change. Electronically Signed   By: Lavonia Dana M.D.   On: 04/09/2022 07:58    Pending Labs Unresulted Labs (From admission, onward)     Start     Ordered   04/09/22 1202  Legionella Pneumophila Serogp 1 Ur Ag  Once,   R        04/09/22 1201   04/09/22 1202  Mycoplasma pneumoniae antibody, IgM  Once,   R        04/09/22 1201   04/09/22 1202  Expectorated Sputum Assessment w Gram Stain, Rflx to Resp Cult  Once,   R        04/09/22 1201            Vitals/Pain Today's Vitals   04/10/22 0901 04/10/22 0910 04/10/22 1009 04/10/22 1114  BP: (!) 143/105 (!) 143/105    Pulse: (!) 116 (!) 117    Resp: (!) 23 (!) 24    Temp:    98 F (36.7 C)  TempSrc:    Oral  SpO2: 97% 98% 95%   Weight:      Height:      PainSc:        Isolation Precautions No active isolations  Medications Medications  albuterol (PROVENTIL,VENTOLIN) solution continuous neb (0 mg/hr Nebulization Hold 04/09/22 0806)  ibuprofen (ADVIL) tablet 800 mg (has no administration in time range)  anastrozole (ARIMIDEX) tablet 1 mg (1 mg Oral Given 04/10/22 0821)  LORazepam (ATIVAN) tablet 1 mg (has no administration in time range)  OLANZapine (ZYPREXA) tablet 7.5 mg (7.5 mg Oral Given 04/10/22 0820)  pantoprazole (PROTONIX) EC tablet 40 mg (40 mg Oral Given 04/10/22 0821)  arformoterol (BROVANA) nebulizer solution 15  mcg (15 mcg Nebulization Given 04/10/22 0824)    And  umeclidinium bromide (INCRUSE ELLIPTA) 62.5 MCG/ACT 1 puff (1 puff Inhalation Given 04/10/22 0824)  albuterol (PROVENTIL) (2.5 MG/3ML) 0.083% nebulizer solution 2.5 mg (2.5 mg Inhalation Given 04/10/22 1032)  enoxaparin (LOVENOX) injection 40 mg (40 mg Subcutaneous Given 04/09/22 1239)  doxycycline (VIBRA-TABS) tablet 100 mg (100 mg Oral Given 04/10/22 0821)  guaiFENesin (MUCINEX) 12 hr tablet 1,200 mg (1,200 mg Oral Given 04/10/22 0821)  methylPREDNISolone sodium succinate (SOLU-MEDROL) 40 mg/mL injection 40 mg (40 mg Intravenous Given 04/10/22 1017)  ipratropium-albuterol (DUONEB) 0.5-2.5 (3) MG/3ML nebulizer solution 3 mL (has no administration in time range)  methylPREDNISolone sodium succinate (SOLU-MEDROL) 125 mg/2 mL injection 80 mg (80 mg Intravenous Given 04/09/22 1239)    Mobility walks with device High fall risk   Focused Assessments Pulmonary Assessment Handoff:  Lung sounds: Bilateral Breath Sounds: Diminished L Breath Sounds: Inspiratory wheezes R Breath Sounds: Expiratory wheezes, Inspiratory wheezes O2 Device: Nasal Cannula O2 Flow Rate (L/min): 4 L/min    R Recommendations: See Admitting Provider Note  Report given to:   Additional Notes:

## 2022-04-10 NOTE — Plan of Care (Signed)

## 2022-04-10 NOTE — Progress Notes (Signed)
Pt received from ED alert and calm.  Oriented to room and equipment.  VS taken, CCMD notified of tele placement, and CHG wipes administered.  Care plan reviewed.  Pt resting comfortably with call bell in reach.  Will cont plan of care.

## 2022-04-11 DIAGNOSIS — J9621 Acute and chronic respiratory failure with hypoxia: Secondary | ICD-10-CM

## 2022-04-11 DIAGNOSIS — J441 Chronic obstructive pulmonary disease with (acute) exacerbation: Secondary | ICD-10-CM | POA: Diagnosis not present

## 2022-04-11 LAB — GLUCOSE, CAPILLARY
Glucose-Capillary: 144 mg/dL — ABNORMAL HIGH (ref 70–99)
Glucose-Capillary: 121 mg/dL — ABNORMAL HIGH (ref 70–99)
Glucose-Capillary: 132 mg/dL — ABNORMAL HIGH (ref 70–99)
Glucose-Capillary: 138 mg/dL — ABNORMAL HIGH (ref 70–99)

## 2022-04-11 NOTE — Progress Notes (Signed)
NAME:  Abigail Castillo, MRN:  [id number], DOB:  Oct 07, 1950, LOS: 2 ADMISSION DATE:  04/09/2022, CONSULTATION DATE: April 10, 2022 REFERRING MD: Tyrell Antonio, CHIEF COMPLAINT: Dyspnea  History of Present Illness:  71 year old female with COPD, chronic respiratory failure with hypoxemia who follows with Dr. Lamonte Sakai presented for shortness of breath to the emergency department yesterday.  She says that last week she got a cold and she has been having allergies.  She had significant postnasal drip and sinus congestion.  She has had some cough but not much mucus production.  Her primary complaint yesterday was shortness of breath.  She denies leg swelling.  She has not had hemoptysis or chest pain.  In the emergency department she has been treated with bronchodilators, steroids, and required noninvasive mechanical ventilation.  The noninvasive mechanical ventilation was just stopped and she says she is feeling a bit better.  She is currently on 4 L of oxygen, she uses 2-3 at home.  She says that she quit smoking several years ago.    Pertinent  Medical History   Past Medical History:  Diagnosis Date   Acute on chronic respiratory failure with hypoxia ([place]) 07/05/2019   Anxiety    Blood transfusion    Blood transfusion without reported diagnosis    Breast cancer ([place]) 05/13/2021   Chronic mental illness    COPD (chronic obstructive pulmonary disease) ([place])    COVID-19 virus infection 07/06/2019   DEPRESSION 05/31/2009   Emphysema of lung ([facility])    Esophageal stricture 03/14/2015   GERD (gastroesophageal reflux disease)    Hepatic steatosis    History of home oxygen therapy    History of pneumonia    Osteoporosis    Oxygen dependent    on O2 at 2-3L/min 24hr/day   Shortness of breath dyspnea    Tobacco abuse    ongoing   Vitamin B 12 deficiency    Vitamin D deficiency      Significant Hospital Events: Including procedures, antibiotic start and stop dates in addition to other pertinent  events   April 09, 2022 respiratory viral panel negative for flu and COVID November 2015 chest x-ray images independently reviewed showing emphysema, interstitial changes in bases  Interim History / Subjective:  Required BiPAP throughout the night  Objective   Blood pressure 135/64, pulse (!) 104, temperature 98 F (36.7 C), temperature source Axillary, resp. rate 20, height '5\' 2"'$  (1.575 m), weight 65 kg, SpO2 98 %.        Intake/Output Summary (Last 24 hours) at 04/11/2022 0810 Last data filed at 04/11/2022 0600 Gross per 24 hour  Intake 200 ml  Output 400 ml  Net -200 ml   Filed Weights   04/09/22 0734 04/10/22 1242  Weight: 65.8 kg 65 kg    Examination:  General: Frail appearing female in no acute distress HEENT: MM pink/moist no JVD is appreciated, endentulous Neuro: Grossly intact CV: Sounds are regular PULM: Decreased breath sounds throughout currently on 3 L with sats 94%   GI: soft, bsx4 active  GU: Extremities: warm/dry, 1+ edema  Skin: no rashes or lesions    Resolved Hospital Problem list     Assessment & Plan:  COPD with acute exacerbation Acute respiratory failure with hypoxemia Chronic respiratory failure with hypoxemia  Discussion: Severe COPD exacerbation requiring noninvasive mechanical ventilation.  Likely started with a viral illness a week ago.  Now improving with steroids and bronchodilators.  Plan: Continue IV steroids As needed noninvasive mechanical ventilatory support Continue  [place] Continue bronchodilators Wean FiO2 as tolerated     We will follow  Best Practice (right click and "Reselect all SmartList Selections" daily)     Labs   CBC: Recent Labs  Lab 04/09/22 0745 04/10/22 0220  WBC 12.2* 11.8*  HGB 13.6 14.4  HCT 42.7 44.1  MCV 96.4 94.4  PLT 215 861    Basic Metabolic Panel: Recent Labs  Lab 04/09/22 0745 04/10/22 0220  NA 136 133*  K 3.8 4.5  CL 100 96*  CO2 23 24  GLUCOSE 181* 147*  BUN 13  15  CREATININE 0.69 0.69  CALCIUM 8.1* 9.4   GFR: Estimated Creatinine Clearance: 58 mL/min (by C-G formula based on SCr of 0.69 mg/dL). Recent Labs  Lab 04/09/22 0745 04/10/22 0220  WBC 12.2* 11.8*    Liver Function Tests: Recent Labs  Lab 04/09/22 0745  AST 25  ALT 25  ALKPHOS 104  BILITOT 1.0  PROT 6.2*  ALBUMIN 3.6   No results for input(s): "LIPASE", "AMYLASE" in the last 168 hours. No results for input(s): "AMMONIA" in the last 168 hours.  ABG    Component Value Date/Time   PHART 7.339 (L) 02/05/2010 1540   PCO2ART 64.2 (HH) 02/05/2010 1540   PO2ART 107.0 (H) 02/05/2010 1540   HCO3 34.5 (H) 02/05/2010 1540   TCO2 36 02/05/2010 1540   O2SAT 98.0 02/05/2010 1540     Coagulation Profile: No results for input(s): "INR", "PROTIME" in the last 168 hours.  Cardiac Enzymes: No results for input(s): "CKTOTAL", "CKMB", "CKMBINDEX", "TROPONINI" in the last 168 hours.  HbA1C: Hgb A1c MFr Bld  Date/Time Value Ref Range Status  10/28/2021 08:41 AM 5.3 4.6 - 6.5 % Final    Comment:    Glycemic Control Guidelines for People with Diabetes:Non Diabetic:  <6%Goal of Therapy: <7%Additional Action Suggested:  >8%   02/03/2014 10:23 AM 5.4 4.6 - 6.5 % Final    Comment:    Glycemic Control Guidelines for People with Diabetes:Non Diabetic:  <6%Goal of Therapy: <7%Additional Action Suggested:  >8%     CBG: Recent Labs  Lab 04/10/22 2121 04/11/22 0602  GLUCAP 140* [place] Jermale Crass ACNP Acute Care Nurse Practitioner [place] Please consult [place] 04/11/2022, 8:10 AM

## 2022-04-11 NOTE — Progress Notes (Signed)
PROGRESS NOTE    Abigail Castillo  TSV:[id number] DOB: 05/06/1951 DOA: 04/09/2022 PCP: [name], NP   Brief Narrative: 71 past medical history significant for COPD stage IV, chronic hypoxic respiratory failure 2 to 3 L, moderate pulmonary hypertension, breast cancer status post left lumpectomy and hormone therapy, anxiety, depression presented with worsening shortness of breath and cough symptoms started 2 or 3 days prior to admission.  She was noted to be hypoxic requiring 6 L of oxygen.  She received IV Solu-Medrol in the ED.  Patient oxygen saturation dropped to the 70s this morning and she was in respiratory distress she was placed on BiPAP.   Assessment & Plan: Principal Problem:   COPD (chronic obstructive pulmonary disease) (HCC) Active Problems:   Depression   GERD (gastroesophageal reflux disease)   Acute on chronic respiratory failure with hypoxia (HCC)   Ductal carcinoma in situ (DCIS) of left breast   Hyponatremia  1-Acute on Chronic Hypoxic Respiratory Failure in the setting of acute COPD exacerbation Chest x ray: COPD changes with severe bibasilar chronic interstitial lung disease.  Question pulmonary arterial hypertension. -Continue with Solu-Medrol -Continue with BiPAP PRN -Pulmonologist consulted.  -Continue with doxycycline -Continue with Brovana and Incruse.  Scheduled nebulizer.  Continue with guaifenesin She has been off BIPAP for last several hours.   Anxiety,  depression: Continue with Ativan Continue with olanzapine  Pulmonary hypertension: Pulmonology consulted BNP 22.   History of breast cancer status postlumpectomy: Continue with anastrozole  Mild Hyponatremia; monitor.  Elevation CBG; suspect in setting steroids. Monitor.  GERD; PPI  Estimated body mass index is 26.21 kg/m as calculated from the following:   Height as of this encounter: '5\' 2"'$  (1.575 m).   Weight as of this encounter: 65 kg.   DVT prophylaxis: Lovenox Code Status:  Full code Family Communication: no family at bedside Disposition Plan:  Status is: Inpatient Remains inpatient appropriate because: management of Resp failure, COPD exacerbation.     Consultants:  Pulmonologist   Procedures:    Antimicrobials:    Subjective: She is breathing some what better. She use BIPAP last night.  Report SOB, cough   Objective: Vitals:   04/11/22 0057 04/11/22 0433 04/11/22 0826 04/11/22 0907  BP: (!) 150/83 135/64 133/82   Pulse: (!) 105 (!) 104 96   Resp: '19 20 17   '$ Temp:  98 F (36.7 C) 97.8 F (36.6 C)   TempSrc:  Axillary Oral   SpO2: 99% 98% 91% 94%  Weight:      Height:        Intake/Output Summary (Last 24 hours) at 04/11/2022 0920 Last data filed at 04/11/2022 0600 Gross per 24 hour  Intake 200 ml  Output 400 ml  Net -200 ml   Filed Weights   04/09/22 0734 04/10/22 1242  Weight: 65.8 kg 65 kg    Examination:  General exam: NAD Respiratory system: BL air movement, BL wheezing.  Cardiovascular system: S 1, S 2 RRR Gastrointestinal system: BS present, soft, nt Central nervous system: alert, follows command Extremities: no edema   Data Reviewed: I have personally reviewed following labs and imaging studies  CBC: Recent Labs  Lab 04/09/22 0745 04/10/22 0220  WBC 12.2* 11.8*  HGB 13.6 14.4  HCT 42.7 44.1  MCV 96.4 94.4  PLT 215 923    Basic Metabolic Panel: Recent Labs  Lab 04/09/22 0745 04/10/22 0220  NA 136 133*  K 3.8 4.5  CL 100 96*  CO2 23 24  GLUCOSE  181* 147*  BUN 13 15  CREATININE 0.69 0.69  CALCIUM 8.1* 9.4    GFR: Estimated Creatinine Clearance: 58 mL/min (by C-G formula based on SCr of 0.69 mg/dL). Liver Function Tests: Recent Labs  Lab 04/09/22 0745  AST 25  ALT 25  ALKPHOS 104  BILITOT 1.0  PROT 6.2*  ALBUMIN 3.6    No results for input(s): "LIPASE", "AMYLASE" in the last 168 hours. No results for input(s): "AMMONIA" in the last 168 hours. Coagulation Profile: No results for  input(s): "INR", "PROTIME" in the last 168 hours. Cardiac Enzymes: No results for input(s): "CKTOTAL", "CKMB", "CKMBINDEX", "TROPONINI" in the last 168 hours. BNP (last 3 results) No results for input(s): "PROBNP" in the last 8760 hours. HbA1C: No results for input(s): "HGBA1C" in the last 72 hours. CBG: Recent Labs  Lab 04/10/22 2121 04/11/22 0602  GLUCAP 140* 144*   Lipid Profile: No results for input(s): "CHOL", "HDL", "LDLCALC", "TRIG", "CHOLHDL", "LDLDIRECT" in the last 72 hours. Thyroid Function Tests: No results for input(s): "TSH", "T4TOTAL", "FREET4", "T3FREE", "THYROIDAB" in the last 72 hours. Anemia Panel: No results for input(s): "VITAMINB12", "FOLATE", "FERRITIN", "TIBC", "IRON", "RETICCTPCT" in the last 72 hours. Sepsis Labs: No results for input(s): "PROCALCITON", "LATICACIDVEN" in the last 168 hours.  Recent Results (from the past 240 hour(s))  Resp Panel by RT-PCR (Flu A&B, Covid) Anterior Nasal Swab     Status: None   Collection Time: 04/09/22  7:45 AM   Specimen: Anterior Nasal Swab  Result Value Ref Range Status   SARS Coronavirus 2 by RT PCR NEGATIVE NEGATIVE Final    Comment: (NOTE) SARS-CoV-2 target nucleic acids are NOT DETECTED.  The SARS-CoV-2 RNA is generally detectable in upper respiratory specimens during the acute phase of infection. The lowest concentration of SARS-CoV-2 viral copies this assay can detect is 138 copies/mL. A negative result does not preclude SARS-Cov-2 infection and should not be used as the sole basis for treatment or other patient management decisions. A negative result may occur with  improper specimen collection/handling, submission of specimen other than nasopharyngeal swab, presence of viral mutation(s) within the areas targeted by this assay, and inadequate number of viral copies(<138 copies/mL). A negative result must be combined with clinical observations, patient history, and epidemiological information. The expected  result is Negative.  Fact Sheet for Patients:  EntrepreneurPulse.com.au  Fact Sheet for Healthcare Providers:  IncredibleEmployment.be  This test is no t yet approved or cleared by the [place] FDA and  has been authorized for detection and/or diagnosis of SARS-CoV-2 by FDA under an Emergency Use Authorization (EUA). This EUA will remain  in effect (meaning this test can be used) for the duration of the COVID-19 declaration under Section 564(b)(1) of the Act, 21 U.S.C.section 360bbb-3(b)(1), unless the authorization is terminated  or revoked sooner.       Influenza A by PCR NEGATIVE NEGATIVE Final   Influenza B by PCR NEGATIVE NEGATIVE Final    Comment: (NOTE) The Xpert Xpress SARS-CoV-2/FLU/RSV plus assay is intended as an aid in the diagnosis of influenza from Nasopharyngeal swab specimens and should not be used as a sole basis for treatment. Nasal washings and aspirates are unacceptable for Xpert Xpress SARS-CoV-2/FLU/RSV testing.  Fact Sheet for Patients: EntrepreneurPulse.com.au  Fact Sheet for Healthcare Providers: IncredibleEmployment.be  This test is not yet approved or cleared by the [place] FDA and has been authorized for detection and/or diagnosis of SARS-CoV-2 by FDA under an Emergency Use Authorization (EUA). This EUA will remain in  effect (meaning this test can be used) for the duration of the COVID-19 declaration under Section 564(b)(1) of the Act, 21 U.S.C. section 360bbb-3(b)(1), unless the authorization is terminated or revoked.  Performed at [facility], [place] [address] [place], [place] [address]          Radiology Studies: No results found.      Scheduled Meds:  albuterol  10 mg/hr Nebulization Once   anastrozole  1 mg Oral Daily   arformoterol  15 mcg Nebulization BID   And   umeclidinium bromide  1 puff Inhalation Daily   doxycycline  100 mg Oral  Q12H   enoxaparin (LOVENOX) injection  40 mg Subcutaneous Q24H   guaiFENesin  1,200 mg Oral BID   ipratropium-albuterol  3 mL Nebulization BID   methylPREDNISolone (SOLU-MEDROL) injection  40 mg Intravenous Q12H   OLANZapine  7.5 mg Oral Daily   pantoprazole  40 mg Oral Daily   Continuous Infusions:   LOS: 2 days    Time spent: 35 minutes    Drisana Schweickert A Kerstie Agent, MD [facility]   If 7PM-7AM, please contact night-coverage [email or web address]  04/11/2022, 9:20 AM

## 2022-04-11 NOTE — Progress Notes (Signed)
Patient was placed on Bi-pap per RT around 0100. Now pt is awake and requested to be off bi-pap. Switched to [place] 02 via [place]. Sating 97% in 4l o2 via [place]. Plan of care continues.

## 2022-04-11 NOTE — Care Management Important Message (Signed)
Important Message  Patient Details  Name: Abigail Castillo MRN: [id number] Date of Birth: 03-15-1951   Medicare Important Message Given:  Yes     Shelda Altes 04/11/2022, 9:21 AM

## 2022-04-12 DIAGNOSIS — J441 Chronic obstructive pulmonary disease with (acute) exacerbation: Secondary | ICD-10-CM | POA: Diagnosis not present

## 2022-04-12 LAB — GLUCOSE, CAPILLARY
Glucose-Capillary: 138 mg/dL — ABNORMAL HIGH (ref 70–99)
Glucose-Capillary: 115 mg/dL — ABNORMAL HIGH (ref 70–99)
Glucose-Capillary: 122 mg/dL — ABNORMAL HIGH (ref 70–99)
Glucose-Capillary: 120 mg/dL — ABNORMAL HIGH (ref 70–99)

## 2022-04-12 LAB — BASIC METABOLIC PANEL
Anion gap: 13 (ref 5–15)
BUN: 25 mg/dL — ABNORMAL HIGH (ref 8–23)
CO2: 28 mmol/L (ref 22–32)
Calcium: 9.5 mg/dL (ref 8.9–10.3)
Chloride: 95 mmol/L — ABNORMAL LOW (ref 98–111)
Creatinine, Ser: 0.79 mg/dL (ref 0.44–1.00)
GFR, Estimated: >60 mL/min (ref >60)
Glucose, Bld: 117 mg/dL — ABNORMAL HIGH (ref 70–99)
Potassium: 5.3 mmol/L — ABNORMAL HIGH (ref 3.5–5.1)
Sodium: 136 mmol/L (ref 135–145)

## 2022-04-12 MED ORDER — BISACODYL 10 MG RE SUPP: 10.0000 mg | Freq: Every day | RECTAL | Status: DC | PRN

## 2022-04-12 MED ORDER — AMLODIPINE BESYLATE 5 MG PO TABS
2.5000 mg | ORAL_TABLET | Freq: Every day | ORAL | Status: DC
  Administered 2022-04-12 – 2022-05-12 (×31): 2.5 mg via ORAL
  Filled 2022-04-12 (×31): qty 1

## 2022-04-12 MED ORDER — POLYETHYLENE GLYCOL 3350 17 G PO PACK
17.0000 g | PACK | Freq: Two times a day (BID) | ORAL | Status: DC
  Administered 2022-04-12 – 2022-05-07 (×23): 17 g via ORAL
  Filled 2022-04-12 (×39): qty 1

## 2022-04-12 MED ORDER — ACETAMINOPHEN 325 MG PO TABS
650.0000 mg | ORAL_TABLET | Freq: Four times a day (QID) | ORAL | Status: DC | PRN
  Administered 2022-04-13 – 2022-05-11 (×7): 650 mg via ORAL
  Filled 2022-04-12 (×7): qty 2

## 2022-04-12 MED ORDER — SENNOSIDES-DOCUSATE SODIUM 8.6-50 MG PO TABS
1.0000 | ORAL_TABLET | Freq: Two times a day (BID) | ORAL | Status: DC
  Administered 2022-04-12 – 2022-05-07 (×24): 1 via ORAL
  Filled 2022-04-12 (×42): qty 1

## 2022-04-12 NOTE — Progress Notes (Signed)
PROGRESS NOTE    Abigail Castillo  ZDG:[id number] DOB: 1951/03/28 DOA: 04/09/2022 PCP: [name], NP   Brief Narrative: 71 past medical history significant for COPD stage IV, chronic hypoxic respiratory failure 2 to 3 L, moderate pulmonary hypertension, breast cancer status post left lumpectomy and hormone therapy, anxiety, depression presented with worsening shortness of breath and cough symptoms started 2 or 3 days prior to admission.  She was noted to be hypoxic requiring 6 L of oxygen.  She received IV Solu-Medrol in the ED.  Patient oxygen saturation dropped to the 70s this morning and she was in respiratory distress she was placed on BiPAP.   Assessment & Plan: Principal Problem:   COPD (chronic obstructive pulmonary disease) (HCC) Active Problems:   Depression   GERD (gastroesophageal reflux disease)   Acute on chronic respiratory failure with hypoxia (HCC)   Ductal carcinoma in situ (DCIS) of left breast   Hyponatremia  1-Acute on Chronic Hypoxic Respiratory Failure in the setting of acute COPD exacerbation Chest x ray: COPD changes with severe bibasilar chronic interstitial lung disease.  Question pulmonary arterial hypertension. -Continue with Solu-Medrol -Continue with BiPAP PRN -Pulmonologist consulted.  -Continue with doxycycline -Continue with Brovana and Incruse.  Scheduled nebulizer.  Continue with guaifenesin -She didn't required BIPAP last night. Not at baseline, still SOB.  -Add flutter valve. Continue with IV steroids.   Anxiety,  depression: Continue with Ativan. Continue with olanzapine.  HTN; BP elevated. Started  Norvasc.   Pulmonary hypertension: Pulmonology consulted BNP 22.   History of breast cancer status postlumpectomy: Continue with anastrozole  Mild Hyponatremia; monitor.  Elevation CBG; suspect in setting steroids. Monitor.  GERD; PPI Constipation: Start Bowel regimen.  Hypokalemia; hemolyses specimen.  Estimated body mass index  is 26.21 kg/m as calculated from the following:   Height as of this encounter: '5\' 2"'$  (1.575 m).   Weight as of this encounter: 65 kg.   DVT prophylaxis: Lovenox Code Status: Full code Family Communication: no family at bedside Disposition Plan:  Status is: Inpatient Remains inpatient appropriate because: management of Resp failure, COPD exacerbation.     Consultants:  Pulmonologist   Procedures:    Antimicrobials:    Subjective: She is alert, still SOB, didn't required BIPAP last night. No Bowel movement since admission.   Objective: Vitals:   04/11/22 2346 04/12/22 0414 04/12/22 0804 04/12/22 0817  BP: 138/79 (!) 124/95  (!) 157/88  Pulse: (!) 103 (!) 103 (!) 103 (!) 109  Resp: '20 20 20 20  '$ Temp: 97.6 F (36.4 C) 97.8 F (36.6 C)  97.8 F (36.6 C)  TempSrc: Oral Oral  Oral  SpO2: 97% 96% 99% 95%  Weight:      Height:       No intake or output data in the 24 hours ending 04/12/22 0907  Filed Weights   04/09/22 0734 04/10/22 1242  Weight: 65.8 kg 65 kg    Examination:  General exam: NAD Respiratory system: BL wheezing, mild tachypnea Cardiovascular system: S 1, S 2 RRR Gastrointestinal system: BS present, soft, nt Central nervous system: Alert, follows command Extremities: No edema   Data Reviewed: I have personally reviewed following labs and imaging studies  CBC: Recent Labs  Lab 04/09/22 0745 04/10/22 0220  WBC 12.2* 11.8*  HGB 13.6 14.4  HCT 42.7 44.1  MCV 96.4 94.4  PLT 215 951    Basic Metabolic Panel: Recent Labs  Lab 04/09/22 0745 04/10/22 0220 04/12/22 0108  NA 136 133* 136  K 3.8 4.5 5.3*  CL 100 96* 95*  CO2 '23 24 28  '$ GLUCOSE 181* 147* 117*  BUN 13 15 25*  CREATININE 0.69 0.69 0.79  CALCIUM 8.1* 9.4 9.5    GFR: Estimated Creatinine Clearance: 58 mL/min (by C-G formula based on SCr of 0.79 mg/dL). Liver Function Tests: Recent Labs  Lab 04/09/22 0745  AST 25  ALT 25  ALKPHOS 104  BILITOT 1.0  PROT 6.2*   ALBUMIN 3.6    No results for input(s): "LIPASE", "AMYLASE" in the last 168 hours. No results for input(s): "AMMONIA" in the last 168 hours. Coagulation Profile: No results for input(s): "INR", "PROTIME" in the last 168 hours. Cardiac Enzymes: No results for input(s): "CKTOTAL", "CKMB", "CKMBINDEX", "TROPONINI" in the last 168 hours. BNP (last 3 results) No results for input(s): "PROBNP" in the last 8760 hours. HbA1C: No results for input(s): "HGBA1C" in the last 72 hours. CBG: Recent Labs  Lab 04/11/22 0602 04/11/22 1107 04/11/22 1636 04/11/22 2023 04/12/22 0625  GLUCAP 144* 121* 132* 138* 138*    Lipid Profile: No results for input(s): "CHOL", "HDL", "LDLCALC", "TRIG", "CHOLHDL", "LDLDIRECT" in the last 72 hours. Thyroid Function Tests: No results for input(s): "TSH", "T4TOTAL", "FREET4", "T3FREE", "THYROIDAB" in the last 72 hours. Anemia Panel: No results for input(s): "VITAMINB12", "FOLATE", "FERRITIN", "TIBC", "IRON", "RETICCTPCT" in the last 72 hours. Sepsis Labs: No results for input(s): "PROCALCITON", "LATICACIDVEN" in the last 168 hours.  Recent Results (from the past 240 hour(s))  Resp Panel by RT-PCR (Flu A&B, Covid) Anterior Nasal Swab     Status: None   Collection Time: 04/09/22  7:45 AM   Specimen: Anterior Nasal Swab  Result Value Ref Range Status   SARS Coronavirus 2 by RT PCR NEGATIVE NEGATIVE Final    Comment: (NOTE) SARS-CoV-2 target nucleic acids are NOT DETECTED.  The SARS-CoV-2 RNA is generally detectable in upper respiratory specimens during the acute phase of infection. The lowest concentration of SARS-CoV-2 viral copies this assay can detect is 138 copies/mL. A negative result does not preclude SARS-Cov-2 infection and should not be used as the sole basis for treatment or other patient management decisions. A negative result may occur with  improper specimen collection/handling, submission of specimen other than nasopharyngeal swab, presence  of viral mutation(s) within the areas targeted by this assay, and inadequate number of viral copies(<138 copies/mL). A negative result must be combined with clinical observations, patient history, and epidemiological information. The expected result is Negative.  Fact Sheet for Patients:  EntrepreneurPulse.com.au  Fact Sheet for Healthcare Providers:  IncredibleEmployment.be  This test is no t yet approved or cleared by the [place] FDA and  has been authorized for detection and/or diagnosis of SARS-CoV-2 by FDA under an Emergency Use Authorization (EUA). This EUA will remain  in effect (meaning this test can be used) for the duration of the COVID-19 declaration under Section 564(b)(1) of the Act, 21 U.S.C.section 360bbb-3(b)(1), unless the authorization is terminated  or revoked sooner.       Influenza A by PCR NEGATIVE NEGATIVE Final   Influenza B by PCR NEGATIVE NEGATIVE Final    Comment: (NOTE) The Xpert Xpress SARS-CoV-2/FLU/RSV plus assay is intended as an aid in the diagnosis of influenza from Nasopharyngeal swab specimens and should not be used as a sole basis for treatment. Nasal washings and aspirates are unacceptable for Xpert Xpress SARS-CoV-2/FLU/RSV testing.  Fact Sheet for Patients: EntrepreneurPulse.com.au  Fact Sheet for Healthcare Providers: IncredibleEmployment.be  This test is not yet approved  or cleared by the [place] and has been authorized for detection and/or diagnosis of SARS-CoV-2 by FDA under an Emergency Use Authorization (EUA). This EUA will remain in effect (meaning this test can be used) for the duration of the COVID-19 declaration under Section 564(b)(1) of the Act, 21 U.S.C. section 360bbb-3(b)(1), unless the authorization is terminated or revoked.  Performed at [facility], [place] [address], [place], [place] [address]          Radiology  Studies: No results found.      Scheduled Meds:  albuterol  10 mg/hr Nebulization Once   amLODipine  2.5 mg Oral Daily   anastrozole  1 mg Oral Daily   arformoterol  15 mcg Nebulization BID   And   umeclidinium bromide  1 puff Inhalation Daily   doxycycline  100 mg Oral Q12H   enoxaparin (LOVENOX) injection  40 mg Subcutaneous Q24H   guaiFENesin  1,200 mg Oral BID   ipratropium-albuterol  3 mL Nebulization BID   methylPREDNISolone (SOLU-MEDROL) injection  40 mg Intravenous Q12H   OLANZapine  7.5 mg Oral Daily   pantoprazole  40 mg Oral Daily   Continuous Infusions:   LOS: 3 days    Time spent: 35 minutes    Catherene Kaleta A Tereka Thorley, MD [facility]   If 7PM-7AM, please contact night-coverage [email or web address]  04/12/2022, 9:07 AM

## 2022-04-12 NOTE — Evaluation (Signed)
Physical Therapy Evaluation Patient Details Name: Abigail Castillo MRN: [id number] DOB: [birth year] Today's Date: 04/12/2022  History of Present Illness  Pt is a 71 y.o. F who presents 04/09/2022 with COPD with acute exacerbation. Significant PMH: COPD, chronic respiratory failure with hypoxemia, COVID-19, osteoporosis, tobacco abuse.  Clinical Impression  Pt admitted with above. Presents with significantly decreased cardiopulmonary endurance in comparison to baseline and desats easily with minimal movement. Desat to 87% on 4L O2 sitting EOB, bumped up to 6L O2 for ambulating to and from bathroom. Desat to 77-83% on 6L O2 during ambulation; required extended sitting rest break to rebound > 88%. HR 107-130 bpm. Encouraged sitting up in chair and flutter valve use. Will progress mobility as tolerated.     Recommendations for follow up therapy are one component of a multi-disciplinary discharge planning process, led by the attending physician.  Recommendations may be updated based on patient status, additional functional criteria and insurance authorization.  Follow Up Recommendations Home health PT      Assistance Recommended at Discharge PRN  Patient can return home with the following  Assistance with cooking/housework;Assist for transportation;Help with stairs or ramp for entrance    Equipment Recommendations None recommended by PT  Recommendations for Other Services       Functional Status Assessment Patient has had a recent decline in their functional status and demonstrates the ability to make significant improvements in function in a reasonable and predictable amount of time.     Precautions / Restrictions Precautions Precautions: Other (comment) Precaution Comments: watch O2 Restrictions Weight Bearing Restrictions: No      Mobility  Bed Mobility Overal bed mobility: Modified Independent                  Transfers Overall transfer level: Needs  assistance Equipment used: Rolling walker (2 wheels) Transfers: Sit to/from Stand Sit to Stand: Min guard           General transfer comment: Increased time to rise from toilet, use of grab bar    Ambulation/Gait Ambulation/Gait assistance: Supervision Gait Distance (Feet): 25 Feet Assistive device: Rolling walker (2 wheels) Gait Pattern/deviations: Step-through pattern, Decreased stride length       General Gait Details: Min reliance thorugh arms on walker, supervision for safety  Stairs            Wheelchair Mobility    Modified Rankin (Stroke Patients Only)       Balance Overall balance assessment: Mild deficits observed, not formally tested                                           Pertinent Vitals/Pain Pain Assessment Pain Assessment: No/denies pain    Home Living Family/patient expects to be discharged to:: Private residence Living Arrangements: Alone;Children Available Help at Discharge: Family (pt son stays part of the time) Type of Home: Mobile home Home Access: Stairs to enter Entrance Stairs-Rails:  (doesn't recall if R/L entering home) Entrance Stairs-Number of Steps: 6   Home Layout: One level Home Equipment: Rollator (4 wheels);Cane - single point;Wheelchair - manual      Prior Function Prior Level of Function : Needs assist             Mobility Comments: uses cane vs Rollator vs no AD depending on day, does not drive ADLs Comments: pt sister does grocery shopping, pt performs IADL's  Hand Dominance        Extremity/Trunk Assessment   Upper Extremity Assessment Upper Extremity Assessment: Overall WFL for tasks assessed    Lower Extremity Assessment Lower Extremity Assessment: Generalized weakness       Communication   Communication: No difficulties  Cognition Arousal/Alertness: Awake/alert Behavior During Therapy: WFL for tasks assessed/performed Overall Cognitive Status: No family/caregiver  present to determine baseline cognitive functioning                                 General Comments: Following all commands. At times, not responding to questions and requires repeating several times        General Comments      Exercises     Assessment/Plan    PT Assessment Patient needs continued PT services  PT Problem List Decreased activity tolerance;Decreased mobility;Cardiopulmonary status limiting activity       PT Treatment Interventions DME instruction;Gait training;Functional mobility training;Therapeutic activities;Therapeutic exercise;Stair training;Balance training;Patient/family education    PT Goals (Current goals can be found in the Care Plan section)  Acute Rehab PT Goals Patient Stated Goal: improved breathing PT Goal Formulation: With patient Time For Goal Achievement: 04/26/22 Potential to Achieve Goals: Good    Frequency Min 3X/week     Co-evaluation               AM-PAC PT "6 Clicks" Mobility  Outcome Measure Help needed turning from your back to your side while in a flat bed without using bedrails?: None Help needed moving from lying on your back to sitting on the side of a flat bed without using bedrails?: None Help needed moving to and from a bed to a chair (including a wheelchair)?: A Little Help needed standing up from a chair using your arms (e.g., wheelchair or bedside chair)?: A Little Help needed to walk in hospital room?: A Little Help needed climbing 3-5 steps with a railing? : A Lot 6 Click Score: 19    End of Session Equipment Utilized During Treatment: Oxygen Activity Tolerance: Patient tolerated treatment well Patient left: in chair;with call bell/phone within reach;with chair alarm set Nurse Communication: Mobility status;Other (comment) (increased O2 needs) PT Visit Diagnosis: Difficulty in walking, not elsewhere classified (R26.2)    Time: 1696-7893 PT Time Calculation (min) (ACUTE ONLY): 29  min   Charges:   PT Evaluation $PT Eval Moderate Complexity: 1 Mod PT Treatments $Therapeutic Activity: 8-22 mins        Wyona Almas, PT, DPT Acute Rehabilitation Services Office [phone]   Deno Etienne 04/12/2022, 9:32 AM

## 2022-04-13 ENCOUNTER — Inpatient Hospital Stay (HOSPITAL_COMMUNITY): Payer: Medicare Other

## 2022-04-13 DIAGNOSIS — J441 Chronic obstructive pulmonary disease with (acute) exacerbation: Secondary | ICD-10-CM | POA: Diagnosis not present

## 2022-04-13 LAB — CBC
HCT: 46.2 % — ABNORMAL HIGH (ref 36.0–46.0)
Hemoglobin: 14.8 g/dL (ref 12.0–15.0)
MCH: 30.8 pg (ref 26.0–34.0)
MCHC: 32 g/dL (ref 30.0–36.0)
MCV: 96 fL (ref 80.0–100.0)
Platelets: 148 10*3/uL — ABNORMAL LOW (ref 150–400)
RBC: 4.81 MIL/uL (ref 3.87–5.11)
RDW: 13.2 % (ref 11.5–15.5)
WBC: 14.1 10*3/uL — ABNORMAL HIGH (ref 4.0–10.5)
nRBC: 0 % (ref 0.0–0.2)

## 2022-04-13 LAB — BASIC METABOLIC PANEL
Anion gap: 16 — ABNORMAL HIGH (ref 5–15)
BUN: 32 mg/dL — ABNORMAL HIGH (ref 8–23)
CO2: 26 mmol/L (ref 22–32)
Calcium: 9.5 mg/dL (ref 8.9–10.3)
Chloride: 93 mmol/L — ABNORMAL LOW (ref 98–111)
Creatinine, Ser: 0.92 mg/dL (ref 0.44–1.00)
GFR, Estimated: >60 mL/min (ref >60)
Glucose, Bld: 129 mg/dL — ABNORMAL HIGH (ref 70–99)
Potassium: 4.5 mmol/L (ref 3.5–5.1)
Sodium: 135 mmol/L (ref 135–145)

## 2022-04-13 LAB — GLUCOSE, CAPILLARY
Glucose-Capillary: 122 mg/dL — ABNORMAL HIGH (ref 70–99)
Glucose-Capillary: 189 mg/dL — ABNORMAL HIGH (ref 70–99)
Glucose-Capillary: 138 mg/dL — ABNORMAL HIGH (ref 70–99)
Glucose-Capillary: 161 mg/dL — ABNORMAL HIGH (ref 70–99)

## 2022-04-13 NOTE — Progress Notes (Signed)
Mobility Specialist Progress Note:   04/13/22 1525  Mobility  Activity Refused mobility   Pt currently getting washed up. Will follow-up as time allows.   Gareth Eagle Dewarren Ledbetter Mobility Specialist Please contact via [employer] or  Rehab Office at [phone]

## 2022-04-13 NOTE — Progress Notes (Signed)
BIPAP on stand-by. No distress at this time. Not indicated.

## 2022-04-13 NOTE — Progress Notes (Signed)
PROGRESS NOTE    Abigail Castillo  YQM:[id number] DOB: 1950-09-27 DOA: 04/09/2022 PCP: [name], NP   Brief Narrative: 71 past medical history significant for COPD stage IV, chronic hypoxic respiratory failure 2 to 3 L, moderate pulmonary hypertension, breast cancer status post left lumpectomy and hormone therapy, anxiety, depression presented with worsening shortness of breath and cough symptoms started 2 or 3 days prior to admission.  She was noted to be hypoxic requiring 6 L of oxygen.  She received IV Solu-Medrol in the ED.  Patient oxygen saturation dropped to the 70s this morning and she was in respiratory distress she was placed on BiPAP.   Assessment & Plan: Principal Problem:   COPD (chronic obstructive pulmonary disease) (HCC) Active Problems:   Depression   GERD (gastroesophageal reflux disease)   Acute on chronic respiratory failure with hypoxia (HCC)   Ductal carcinoma in situ (DCIS) of left breast   Hyponatremia  1-Acute on Chronic Hypoxic Respiratory Failure in the setting of acute COPD exacerbation Chest x ray: COPD changes with severe bibasilar chronic interstitial lung disease.  Question pulmonary arterial hypertension. -Continue with Solu-Medrol -Continue with BiPAP PRN -Pulmonologist consulted.  -Continue with doxycycline day 5/5 -Continue with Brovana and Incruse.  Scheduled nebulizer.  Continue with guaifenesin -She didn't required BIPAP last night. Not at baseline, still SOB.  -Continue flutter valve. Continue with IV steroids.  Chest x ray stable.  Needs to be out bed, ambulation.   Anxiety,  depression: Continue with Ativan. Continue with olanzapine.  HTN; BP elevated. Started  Norvasc.   Pulmonary hypertension: Pulmonology consulted BNP 22.   History of breast cancer status postlumpectomy: Continue with anastrozole  Mild Hyponatremia; monitor.  Elevation CBG; suspect in setting steroids. Monitor.  GERD; PPI Constipation: had  BM Hyperkalemia; resolved. Hemolyze specimen.   Estimated body mass index is 26.21 kg/m as calculated from the following:   Height as of this encounter: '5\' 2"'$  (1.575 m).   Weight as of this encounter: 65 kg.   DVT prophylaxis: Lovenox Code Status: Full code Family Communication: no family at bedside Disposition Plan:  Status is: Inpatient Remains inpatient appropriate because: management of Resp failure, COPD exacerbation.     Consultants:  Pulmonologist   Procedures:    Antimicrobials:    Subjective: Dyspnea improving. Worse on exertion   Objective: Vitals:   04/13/22 0338 04/13/22 0756 04/13/22 0825 04/13/22 1141  BP: (!) 146/91  (!) 146/90 127/64  Pulse: (!) 108 93 (!) 103 (!) 110  Resp: '20 18 19 20  '$ Temp: 98 F (36.7 C)  (!) 97.5 F (36.4 C) 98 F (36.7 C)  TempSrc: Axillary  Oral Oral  SpO2: 91% 99% 98% 98%  Weight:      Height:        Intake/Output Summary (Last 24 hours) at 04/13/2022 1250 Last data filed at 04/12/2022 2106 Gross per 24 hour  Intake --  Output 200 ml  Net -200 ml    Filed Weights   04/09/22 0734 04/10/22 1242  Weight: 65.8 kg 65 kg    Examination:  General exam: NAD Respiratory system: BL wheezing Cardiovascular system: S 1, S 2 RRR Gastrointestinal system: BS present, soft, nt Central nervous system: Alert, follows command Extremities: No edema   Data Reviewed: I have personally reviewed following labs and imaging studies  CBC: Recent Labs  Lab 04/09/22 0745 04/10/22 0220 04/13/22 0115  WBC 12.2* 11.8* 14.1*  HGB 13.6 14.4 14.8  HCT 42.7 44.1 46.2*  MCV 96.4  94.4 96.0  PLT 215 272 148*    Basic Metabolic Panel: Recent Labs  Lab 04/09/22 0745 04/10/22 0220 04/12/22 0108 04/13/22 0641  NA 136 133* 136 135  K 3.8 4.5 5.3* 4.5  CL 100 96* 95* 93*  CO2 '23 24 28 26  '$ GLUCOSE 181* 147* 117* 129*  BUN 13 15 25* 32*  CREATININE 0.69 0.69 0.79 0.92  CALCIUM 8.1* 9.4 9.5 9.5    GFR: Estimated Creatinine  Clearance: 50.4 mL/min (by C-G formula based on SCr of 0.92 mg/dL). Liver Function Tests: Recent Labs  Lab 04/09/22 0745  AST 25  ALT 25  ALKPHOS 104  BILITOT 1.0  PROT 6.2*  ALBUMIN 3.6    No results for input(s): "LIPASE", "AMYLASE" in the last 168 hours. No results for input(s): "AMMONIA" in the last 168 hours. Coagulation Profile: No results for input(s): "INR", "PROTIME" in the last 168 hours. Cardiac Enzymes: No results for input(s): "CKTOTAL", "CKMB", "CKMBINDEX", "TROPONINI" in the last 168 hours. BNP (last 3 results) No results for input(s): "PROBNP" in the last 8760 hours. HbA1C: No results for input(s): "HGBA1C" in the last 72 hours. CBG: Recent Labs  Lab 04/12/22 1108 04/12/22 1633 04/12/22 2137 04/13/22 0607 04/13/22 1108  GLUCAP 115* 122* 120* 122* 189*    Lipid Profile: No results for input(s): "CHOL", "HDL", "LDLCALC", "TRIG", "CHOLHDL", "LDLDIRECT" in the last 72 hours. Thyroid Function Tests: No results for input(s): "TSH", "T4TOTAL", "FREET4", "T3FREE", "THYROIDAB" in the last 72 hours. Anemia Panel: No results for input(s): "VITAMINB12", "FOLATE", "FERRITIN", "TIBC", "IRON", "RETICCTPCT" in the last 72 hours. Sepsis Labs: No results for input(s): "PROCALCITON", "LATICACIDVEN" in the last 168 hours.  Recent Results (from the past 240 hour(s))  Resp Panel by RT-PCR (Flu A&B, Covid) Anterior Nasal Swab     Status: None   Collection Time: 04/09/22  7:45 AM   Specimen: Anterior Nasal Swab  Result Value Ref Range Status   SARS Coronavirus 2 by RT PCR NEGATIVE NEGATIVE Final    Comment: (NOTE) SARS-CoV-2 target nucleic acids are NOT DETECTED.  The SARS-CoV-2 RNA is generally detectable in upper respiratory specimens during the acute phase of infection. The lowest concentration of SARS-CoV-2 viral copies this assay can detect is 138 copies/mL. A negative result does not preclude SARS-Cov-2 infection and should not be used as the sole basis for  treatment or other patient management decisions. A negative result may occur with  improper specimen collection/handling, submission of specimen other than nasopharyngeal swab, presence of viral mutation(s) within the areas targeted by this assay, and inadequate number of viral copies(<138 copies/mL). A negative result must be combined with clinical observations, patient history, and epidemiological information. The expected result is Negative.  Fact Sheet for Patients:  EntrepreneurPulse.com.au  Fact Sheet for Healthcare Providers:  IncredibleEmployment.be  This test is no t yet approved or cleared by the [place] FDA and  has been authorized for detection and/or diagnosis of SARS-CoV-2 by FDA under an Emergency Use Authorization (EUA). This EUA will remain  in effect (meaning this test can be used) for the duration of the COVID-19 declaration under Section 564(b)(1) of the Act, 21 U.S.C.section 360bbb-3(b)(1), unless the authorization is terminated  or revoked sooner.       Influenza A by PCR NEGATIVE NEGATIVE Final   Influenza B by PCR NEGATIVE NEGATIVE Final    Comment: (NOTE) The Xpert Xpress SARS-CoV-2/FLU/RSV plus assay is intended as an aid in the diagnosis of influenza from Nasopharyngeal swab specimens and should not  be used as a sole basis for treatment. Nasal washings and aspirates are unacceptable for Xpert Xpress SARS-CoV-2/FLU/RSV testing.  Fact Sheet for Patients: EntrepreneurPulse.com.au  Fact Sheet for Healthcare Providers: IncredibleEmployment.be  This test is not yet approved or cleared by the [place] FDA and has been authorized for detection and/or diagnosis of SARS-CoV-2 by FDA under an Emergency Use Authorization (EUA). This EUA will remain in effect (meaning this test can be used) for the duration of the COVID-19 declaration under Section 564(b)(1) of the Act, 21  U.S.C. section 360bbb-3(b)(1), unless the authorization is terminated or revoked.  Performed at [facility], [place] [address] [place], [place] [address]          Radiology Studies: DG CHEST PORT 1 VIEW  Result Date: 04/13/2022 CLINICAL DATA:  Hypoxemia, dry throat EXAM: PORTABLE CHEST 1 VIEW COMPARISON:  Prior chest x-ray 04/09/2022 FINDINGS: Stable cardiac and mediastinal contours. Severe bullous emphysematous changes in both mid and upper lungs again noted. Mild chronic interstitial prominence in the lung bases. No pneumothorax or pleural effusion. No significant interval change. Surgical clips project over the left chest. IMPRESSION: Stable appearance of the chest with the out evidence of acute cardiopulmonary process. Electronically Signed   By: Jacqulynn Cadet M.D.   On: 04/13/2022 07:35        Scheduled Meds:  albuterol  10 mg/hr Nebulization Once   amLODipine  2.5 mg Oral Daily   anastrozole  1 mg Oral Daily   arformoterol  15 mcg Nebulization BID   And   umeclidinium bromide  1 puff Inhalation Daily   doxycycline  100 mg Oral Q12H   enoxaparin (LOVENOX) injection  40 mg Subcutaneous Q24H   guaiFENesin  1,200 mg Oral BID   ipratropium-albuterol  3 mL Nebulization BID   methylPREDNISolone (SOLU-MEDROL) injection  40 mg Intravenous Q12H   OLANZapine  7.5 mg Oral Daily   pantoprazole  40 mg Oral Daily   polyethylene glycol  17 g Oral BID   senna-docusate  1 tablet Oral BID   Continuous Infusions:   LOS: 4 days    Time spent: 35 minutes    Kenzi Bardwell A Capricia Serda, MD [facility]   If 7PM-7AM, please contact night-coverage [email or web address]  04/13/2022, 12:50 PM

## 2022-04-14 ENCOUNTER — Inpatient Hospital Stay (HOSPITAL_COMMUNITY): Payer: Medicare Other

## 2022-04-14 DIAGNOSIS — J9621 Acute and chronic respiratory failure with hypoxia: Secondary | ICD-10-CM | POA: Diagnosis not present

## 2022-04-14 LAB — GLUCOSE, CAPILLARY
Glucose-Capillary: 133 mg/dL — ABNORMAL HIGH (ref 70–99)
Glucose-Capillary: 135 mg/dL — ABNORMAL HIGH (ref 70–99)
Glucose-Capillary: 130 mg/dL — ABNORMAL HIGH (ref 70–99)
Glucose-Capillary: 144 mg/dL — ABNORMAL HIGH (ref 70–99)

## 2022-04-14 MED ORDER — IOHEXOL 350 MG/ML SOLN
75.0000 mL | Freq: Once | INTRAVENOUS | Status: AC | PRN
  Administered 2022-04-14: 75 mL via INTRAVENOUS

## 2022-04-14 MED ORDER — IPRATROPIUM-ALBUTEROL 0.5-2.5 (3) MG/3ML IN SOLN
3.0000 mL | Freq: Four times a day (QID) | RESPIRATORY_TRACT | Status: DC
  Administered 2022-04-14 – 2022-04-15 (×4): 3 mL via RESPIRATORY_TRACT
  Filled 2022-04-14 (×6): qty 3

## 2022-04-14 MED ORDER — PANTOPRAZOLE SODIUM 40 MG PO TBEC
40.0000 mg | DELAYED_RELEASE_TABLET | Freq: Two times a day (BID) | ORAL | Status: DC
  Administered 2022-04-14 – 2022-05-12 (×56): 40 mg via ORAL
  Filled 2022-04-14 (×57): qty 1

## 2022-04-14 NOTE — Plan of Care (Signed)
  Problem: Education: Goal: Knowledge of disease or condition will improve Outcome: Progressing   Problem: Activity: Goal: Ability to tolerate increased activity will improve Outcome: Progressing Goal: Will verbalize the importance of balancing activity with adequate rest periods Outcome: Progressing   Problem: Respiratory: Goal: Ability to maintain a clear airway will improve Outcome: Progressing Goal: Levels of oxygenation will improve Outcome: Progressing Goal: Ability to maintain adequate ventilation will improve Outcome: Progressing   Problem: Activity: Goal: Risk for activity intolerance will decrease Outcome: Progressing

## 2022-04-14 NOTE — Progress Notes (Signed)
Physical Therapy Treatment Patient Details Name: Abigail Castillo MRN: [id number] DOB: 1951-01-15 Today's Date: 04/14/2022   History of Present Illness Pt is a 71 y.o. F who presents 04/09/2022 with COPD with acute exacerbation. Significant PMH: COPD, chronic respiratory failure with hypoxemia, COVID-19, osteoporosis, tobacco abuse.    PT Comments    Pt making gradual progress but continues to be limited by respiratory status.  She ambulated 20' x 4 with seated rest breaks.  She did require 6 L O2 with sats dropping as low as 85% requiring 1 minute rest to recover.  Continue to progress as able.     Recommendations for follow up therapy are one component of a multi-disciplinary discharge planning process, led by the attending physician.  Recommendations may be updated based on patient status, additional functional criteria and insurance authorization.  Follow Up Recommendations  Home health PT     Assistance Recommended at Discharge PRN  Patient can return home with the following Assistance with cooking/housework;Assist for transportation;Help with stairs or ramp for entrance   Equipment Recommendations  None recommended by PT    Recommendations for Other Services       Precautions / Restrictions Precautions Precautions: Other (comment) Precaution Comments: watch O2     Mobility  Bed Mobility               General bed mobility comments: In chair at arrival    Transfers Overall transfer level: Needs assistance Equipment used: Rolling walker (2 wheels) Transfers: Sit to/from Stand Sit to Stand: Supervision           General transfer comment: Performed x 6 during session with cues for hand placement    Ambulation/Gait Ambulation/Gait assistance: Supervision Gait Distance (Feet): 20 Feet (20'x4) Assistive device: Rolling walker (2 wheels) Gait Pattern/deviations: Step-through pattern, Decreased stride length Gait velocity: decreased     General Gait  Details: Supervision for safety and lines, min reliance of RW, ambulated 20'x4 with 2-3 min seated rest breaks   Stairs             Wheelchair Mobility    Modified Rankin (Stroke Patients Only)       Balance Overall balance assessment: Needs assistance Sitting-balance support: No upper extremity supported Sitting balance-Leahy Scale: Good     Standing balance support: Bilateral upper extremity supported, No upper extremity supported Standing balance-Leahy Scale: Fair Standing balance comment: could static stand or stand pivot without AD; RW to ambulate                            Cognition Arousal/Alertness: Awake/alert Behavior During Therapy: WFL for tasks assessed/performed Overall Cognitive Status: No family/caregiver present to determine baseline cognitive functioning                                 General Comments: Following all commands. At times, not responding to questions and requires repeating several times        Exercises      General Comments General comments (skin integrity, edema, etc.): On 6 L O2 with sats 93% rest and as low as 85% with activity requiring 1 minute to recover.  Pt does well with pursed lip breathing      Pertinent Vitals/Pain Pain Assessment Pain Assessment: No/denies pain    Home Living  Prior Function            PT Goals (current goals can now be found in the care plan section) Progress towards PT goals: Progressing toward goals    Frequency    Min 3X/week      PT Plan Current plan remains appropriate    Co-evaluation              AM-PAC PT "6 Clicks" Mobility   Outcome Measure  Help needed turning from your back to your side while in a flat bed without using bedrails?: None Help needed moving from lying on your back to sitting on the side of a flat bed without using bedrails?: None Help needed moving to and from a bed to a chair (including  a wheelchair)?: A Little Help needed standing up from a chair using your arms (e.g., wheelchair or bedside chair)?: A Little Help needed to walk in hospital room?: A Little Help needed climbing 3-5 steps with a railing? : A Little 6 Click Score: 20    End of Session Equipment Utilized During Treatment: Oxygen Activity Tolerance: Patient tolerated treatment well Patient left: in chair;with call bell/phone within reach;with chair alarm set Nurse Communication: Mobility status PT Visit Diagnosis: Difficulty in walking, not elsewhere classified (R26.2)     Time: 4196-2229 PT Time Calculation (min) (ACUTE ONLY): 23 min  Charges:  $Gait Training: 8-22 mins $Therapeutic Activity: 8-22 mins                     Abran Richard, PT Acute Rehab [facility] [phone]    [name] 04/14/2022, 3:15 PM

## 2022-04-14 NOTE — Progress Notes (Signed)
Mobility Specialist Progress Note:   04/14/22 1352  Mobility  Activity Ambulated with assistance in room  Level of Assistance Minimal assist, patient does 75% or more  Assistive Device Front wheel walker  Distance Ambulated (ft) 4 ft  Activity Response Tolerated well  $Mobility charge 1 Mobility   Pt received in bed willing to participate in mobility. No complaints of pain. MinA to stand then contact guard throughout. Left in chair with call bell in reach and all needs met.   Abigail Castillo Abigail Castillo Mobility Specialist Please contact via [employer] or  Rehab Office at [phone]

## 2022-04-14 NOTE — Progress Notes (Signed)
PROGRESS NOTE    Abigail Castillo  ZOX:[id number] DOB: 20-Sep-1950 DOA: 04/09/2022 PCP: [name], NP   Brief Narrative: 71 past medical history significant for COPD stage IV, chronic hypoxic respiratory failure 2 to 3 L, moderate pulmonary hypertension, breast cancer status post left lumpectomy and hormone therapy, anxiety, depression presented with worsening shortness of breath and cough symptoms started 2 or 3 days prior to admission.  She was noted to be hypoxic requiring 6 L of oxygen.  She received IV Solu-Medrol in the ED.  Patient oxygen saturation dropped to the 70s this morning and she was in respiratory distress she was placed on BiPAP.   Assessment & Plan: Principal Problem:   COPD (chronic obstructive pulmonary disease) (HCC) Active Problems:   Depression   GERD (gastroesophageal reflux disease)   Acute on chronic respiratory failure with hypoxia (HCC)   Ductal carcinoma in situ (DCIS) of left breast   Hyponatremia  1-Acute on Chronic Hypoxic Respiratory Failure in the setting of acute COPD exacerbation Chest x ray: COPD changes with severe bibasilar chronic interstitial lung disease.  Question pulmonary arterial hypertension. -Continue with Solu-Medrol -Continue with BiPAP PRN -Pulmonologist consulted.  -Continue with doxycycline day 5/5 -Continue with Brovana and Incruse.  Scheduled nebulizer.  Continue with guaifenesin -She didn't required BIPAP last night. Not at baseline, still SOB.  -Continue flutter valve. Continue with IV steroids.  Chest x ray stable. She is tachycardic today back on 6 L oxygen, CTA obtain to rule out PE>  Needs to be out bed, ambulation.   Anxiety, Depression: Continue with Ativan. Continue with olanzapine.  HTN; BP elevated. Started  Norvasc.   Pulmonary hypertension: Pulmonology consulted BNP 22.   History of breast cancer status postlumpectomy: Continue with anastrozole  Mild Hyponatremia; monitor.  Elevation CBG; suspect  in setting steroids. Monitor.  GERD; PPI Constipation: Had BM Hyperkalemia; Resolved. Hemolyze specimen.   Estimated body mass index is 26.21 kg/m as calculated from the following:   Height as of this encounter: '5\' 2"'$  (1.575 m).   Weight as of this encounter: 65 kg.   DVT prophylaxis: Lovenox Code Status: Full code Family Communication: no family at bedside Disposition Plan:  Status is: Inpatient Remains inpatient appropriate because: management of Resp failure, COPD exacerbation.     Consultants:  Pulmonologist   Procedures:    Antimicrobials:    Subjective: SOB, back on 6 L oxygen overnight.  Report chest tightness/   Objective: Vitals:   04/14/22 0823 04/14/22 1202 04/14/22 1239 04/14/22 1252  BP: 118/76  126/68   Pulse: (!) 102  (!) 112 (!) 108  Resp: '15  20 19  '$ Temp: 97.6 F (36.4 C)  (!) 97.4 F (36.3 C)   TempSrc: Oral  Oral   SpO2: 96% 94% 93% 93%  Weight:      Height:        Intake/Output Summary (Last 24 hours) at 04/14/2022 1256 Last data filed at 04/14/2022 1242 Gross per 24 hour  Intake 720 ml  Output 701 ml  Net 19 ml    Filed Weights   04/09/22 0734 04/10/22 1242  Weight: 65.8 kg 65 kg    Examination:  General exam: NAD Respiratory system: BL wheezing.  Cardiovascular system: S 1, S 2 RRR Gastrointestinal system: BS present, soft, nt Central nervous system: Alert, follows command Extremities: No edema   Data Reviewed: I have personally reviewed following labs and imaging studies  CBC: Recent Labs  Lab 04/09/22 0745 04/10/22 0220 04/13/22 0115  WBC 12.2* 11.8* 14.1*  HGB 13.6 14.4 14.8  HCT 42.7 44.1 46.2*  MCV 96.4 94.4 96.0  PLT 215 272 148*    Basic Metabolic Panel: Recent Labs  Lab 04/09/22 0745 04/10/22 0220 04/12/22 0108 04/13/22 0641  NA 136 133* 136 135  K 3.8 4.5 5.3* 4.5  CL 100 96* 95* 93*  CO2 '23 24 28 26  '$ GLUCOSE 181* 147* 117* 129*  BUN 13 15 25* 32*  CREATININE 0.69 0.69 0.79 0.92  CALCIUM  8.1* 9.4 9.5 9.5    GFR: Estimated Creatinine Clearance: 50.4 mL/min (by C-G formula based on SCr of 0.92 mg/dL). Liver Function Tests: Recent Labs  Lab 04/09/22 0745  AST 25  ALT 25  ALKPHOS 104  BILITOT 1.0  PROT 6.2*  ALBUMIN 3.6    No results for input(s): "LIPASE", "AMYLASE" in the last 168 hours. No results for input(s): "AMMONIA" in the last 168 hours. Coagulation Profile: No results for input(s): "INR", "PROTIME" in the last 168 hours. Cardiac Enzymes: No results for input(s): "CKTOTAL", "CKMB", "CKMBINDEX", "TROPONINI" in the last 168 hours. BNP (last 3 results) No results for input(s): "PROBNP" in the last 8760 hours. HbA1C: No results for input(s): "HGBA1C" in the last 72 hours. CBG: Recent Labs  Lab 04/13/22 1108 04/13/22 1645 04/13/22 2119 04/14/22 0637 04/14/22 1104  GLUCAP 189* 138* 161* 133* 135*    Lipid Profile: No results for input(s): "CHOL", "HDL", "LDLCALC", "TRIG", "CHOLHDL", "LDLDIRECT" in the last 72 hours. Thyroid Function Tests: No results for input(s): "TSH", "T4TOTAL", "FREET4", "T3FREE", "THYROIDAB" in the last 72 hours. Anemia Panel: No results for input(s): "VITAMINB12", "FOLATE", "FERRITIN", "TIBC", "IRON", "RETICCTPCT" in the last 72 hours. Sepsis Labs: No results for input(s): "PROCALCITON", "LATICACIDVEN" in the last 168 hours.  Recent Results (from the past 240 hour(s))  Resp Panel by RT-PCR (Flu A&B, Covid) Anterior Nasal Swab     Status: None   Collection Time: 04/09/22  7:45 AM   Specimen: Anterior Nasal Swab  Result Value Ref Range Status   SARS Coronavirus 2 by RT PCR NEGATIVE NEGATIVE Final    Comment: (NOTE) SARS-CoV-2 target nucleic acids are NOT DETECTED.  The SARS-CoV-2 RNA is generally detectable in upper respiratory specimens during the acute phase of infection. The lowest concentration of SARS-CoV-2 viral copies this assay can detect is 138 copies/mL. A negative result does not preclude SARS-Cov-2 infection  and should not be used as the sole basis for treatment or other patient management decisions. A negative result may occur with  improper specimen collection/handling, submission of specimen other than nasopharyngeal swab, presence of viral mutation(s) within the areas targeted by this assay, and inadequate number of viral copies(<138 copies/mL). A negative result must be combined with clinical observations, patient history, and epidemiological information. The expected result is Negative.  Fact Sheet for Patients:  EntrepreneurPulse.com.au  Fact Sheet for Healthcare Providers:  IncredibleEmployment.be  This test is no t yet approved or cleared by the [place] FDA and  has been authorized for detection and/or diagnosis of SARS-CoV-2 by FDA under an Emergency Use Authorization (EUA). This EUA will remain  in effect (meaning this test can be used) for the duration of the COVID-19 declaration under Section 564(b)(1) of the Act, 21 U.S.C.section 360bbb-3(b)(1), unless the authorization is terminated  or revoked sooner.       Influenza A by PCR NEGATIVE NEGATIVE Final   Influenza B by PCR NEGATIVE NEGATIVE Final    Comment: (NOTE) The Xpert Xpress SARS-CoV-2/FLU/RSV plus assay  is intended as an aid in the diagnosis of influenza from Nasopharyngeal swab specimens and should not be used as a sole basis for treatment. Nasal washings and aspirates are unacceptable for Xpert Xpress SARS-CoV-2/FLU/RSV testing.  Fact Sheet for Patients: EntrepreneurPulse.com.au  Fact Sheet for Healthcare Providers: IncredibleEmployment.be  This test is not yet approved or cleared by the [place] FDA and has been authorized for detection and/or diagnosis of SARS-CoV-2 by FDA under an Emergency Use Authorization (EUA). This EUA will remain in effect (meaning this test can be used) for the duration of the COVID-19 declaration  under Section 564(b)(1) of the Act, 21 U.S.C. section 360bbb-3(b)(1), unless the authorization is terminated or revoked.  Performed at [facility], [place] [address], [place], [place] [address]          Radiology Studies: CT Angio Chest Pulmonary Embolism (PE) W or WO Contrast  Result Date: 04/14/2022 CLINICAL DATA:  Evaluate for acute pulmonary embolus. EXAM: CT ANGIOGRAPHY CHEST WITH CONTRAST TECHNIQUE: Multidetector CT imaging of the chest was performed using the standard protocol during bolus administration of intravenous contrast. Multiplanar CT image reconstructions and MIPs were obtained to evaluate the vascular anatomy. RADIATION DOSE REDUCTION: This exam was performed according to the departmental dose-optimization program which includes automated exposure control, adjustment of the mA and/or kV according to patient size and/or use of iterative reconstruction technique. CONTRAST:  71m OMNIPAQUE IOHEXOL 350 MG/ML SOLN COMPARISON:  04/17/2010 FINDINGS: Cardiovascular: Satisfactory opacification of the pulmonary arteries to the segmental level. No evidence of pulmonary embolism. Aortic atherosclerosis. Coronary artery calcifications. Normal heart size. No pericardial effusion. Mediastinum/Nodes: Thyroid gland, and esophagus appear unremarkable. There is decreased AP diameter of the trachea and bilateral mainstem bronchi concerning for chronic tracheobronchomalacia. No enlarged axillary, mediastinal, or hilar lymph nodes. Lungs/Pleura: Severe changes of emphysema. No pleural effusion. No airspace consolidation. Subpleural scarring noted within the posterolateral right middle lobe. Upper Abdomen: No acute findings. Small hiatal hernia. Status post cholecystectomy. Musculoskeletal: No acute or suspicious osseous findings. Mild chronic endplate deformities noted throughout the thoracic spine. Review of the MIP images confirms the above findings. IMPRESSION: 1. No evidence for acute pulmonary  embolism. 2. Decreased AP diameter of the trachea and bilateral mainstem bronchi concerning for chronic tracheobronchomalacia. 3. Diffuse bronchial wall thickening with severe emphysema, as above; imaging findings suggestive of underlying COPD. Emphysema (ICD10-J43.9). 4. Coronary artery calcifications. 5. Small hiatal hernia. 6.  Aortic Atherosclerosis (ICD10-I70.0). Electronically Signed   By: TKerby MoorsM.D.   On: 04/14/2022 10:24   DG CHEST PORT 1 VIEW  Result Date: 04/13/2022 CLINICAL DATA:  Hypoxemia, dry throat EXAM: PORTABLE CHEST 1 VIEW COMPARISON:  Prior chest x-ray 04/09/2022 FINDINGS: Stable cardiac and mediastinal contours. Severe bullous emphysematous changes in both mid and upper lungs again noted. Mild chronic interstitial prominence in the lung bases. No pneumothorax or pleural effusion. No significant interval change. Surgical clips project over the left chest. IMPRESSION: Stable appearance of the chest with the out evidence of acute cardiopulmonary process. Electronically Signed   By: HJacqulynn CadetM.D.   On: 04/13/2022 07:35        Scheduled Meds:  albuterol  10 mg/hr Nebulization Once   amLODipine  2.5 mg Oral Daily   anastrozole  1 mg Oral Daily   arformoterol  15 mcg Nebulization BID   And   umeclidinium bromide  1 puff Inhalation Daily   enoxaparin (LOVENOX) injection  40 mg Subcutaneous Q24H   guaiFENesin  1,200 mg Oral BID  ipratropium-albuterol  3 mL Nebulization QID   methylPREDNISolone (SOLU-MEDROL) injection  40 mg Intravenous Q12H   OLANZapine  7.5 mg Oral Daily   pantoprazole  40 mg Oral BID   polyethylene glycol  17 g Oral BID   senna-docusate  1 tablet Oral BID   Continuous Infusions:   LOS: 5 days    Time spent: 35 minutes    Maleiya Pergola A Drevon Plog, MD [facility]   If 7PM-7AM, please contact night-coverage [email or web address]  04/14/2022, 12:56 PM

## 2022-04-14 NOTE — Progress Notes (Signed)
Patient in no distress at this time. BiPAP on stand-by.

## 2022-04-14 NOTE — TOC Initial Note (Signed)
Transition of Care (TOC) - Initial/Assessment Note  [name] RN, BSN Transitions of Care Unit 4E- RN Case Manager See Treatment Team for direct phone #   Patient Details  Name: Abigail Castillo MRN: [id number] Date of Birth: 1950/08/09  Transition of Care [facility]) CM/SW Contact:    Dawayne Patricia, RN Phone Number: 04/14/2022, 4:01 PM  Clinical Narrative:                 Pt from home, CM in to speak with pt regarding transition needs.  Per conversation pt reports she has home 02 w/ [employer] (baseline 2.5-3L), as well as rollator and wheelchair.  Pt is asking about nebulizer for home (used to have one but states she turned it in) she also is asking about suction for home. Pt currently on 6LHF [place]. CM will follow- pt states family can bring transport tank when ready to return home.   List provided for [facility] choice- Per CMS guidelines from [email or web address] website with star ratings (copy placed in shadow chart), pt voiced she would like time to review list with family- CM will return prior to discharge to f/u and make referral.      Expected Discharge Plan: [place] Barriers to Discharge: Continued Medical Work up   Patient Goals and CMS Choice Patient states their goals for this hospitalization and ongoing recovery are:: return home   Choice offered to / list presented to : Patient  Expected Discharge Plan and Services Expected Discharge Plan: [place]   Discharge Planning Services: CM Consult Post Acute Care Choice: Durable Medical Equipment, Home Health Living arrangements for the past 2 months: Mobile Home                           HH Arranged: PT          Prior Living Arrangements/Services Living arrangements for the past 2 months: Mobile Home Lives with:: Self Patient language and need for interpreter reviewed:: Yes        Need for Family Participation in Patient Care: Yes (Comment) Care giver support system in  place?: Yes (comment) Current home services: DME (home 02, rollator, wheelchair) Criminal Activity/Legal Involvement Pertinent to Current Situation/Hospitalization: No - Comment as needed  Activities of Daily Living      Permission Sought/Granted                  Emotional Assessment Appearance:: Appears stated age Attitude/Demeanor/Rapport: Engaged Affect (typically observed): Accepting, Appropriate Orientation: : Oriented to Self, Oriented to Place, Oriented to  Time, Oriented to Situation Alcohol / Substance Use: Not Applicable Psych Involvement: No (comment)  Admission diagnosis:  COPD (chronic obstructive pulmonary disease) ([place]) [J44.9] COPD exacerbation ([place]) [J44.1] Patient Active Problem List   Diagnosis Date Noted   Hyponatremia 04/10/2022   On home oxygen therapy 12/24/2021   Ductal carcinoma in situ (DCIS) of left breast 06/03/2021   Hepatic steatosis 05/13/2021   Elevated blood pressure reading without diagnosis of hypertension 09/14/2020   Acute on chronic respiratory failure with hypoxia (HCC) 07/05/2019   Chronic respiratory failure ([place]) 08/25/2017   Allergic rhinitis 06/16/2017   Schizoaffective disorder ([place]) 06/30/2016   GERD (gastroesophageal reflux disease) 11/02/2015   Esophageal stricture 03/14/2015   Mild hyperlipidemia 02/03/2014   Vitamin D deficiency 02/03/2014   Encounter for routine gynecological examination 06/14/2013   Personal history of colonic polyps 03/10/2012   B12 deficiency 11/11/2010  Memory change 11/04/2010   HYPERTRIGLYCERIDEMIA 06/14/2010   Depression 05/31/2009   History of tobacco abuse 03/02/2008   Osteoporosis 04/05/2007   COPD (chronic obstructive pulmonary disease) ([place]) 03/24/2007   HOARSENESS, CHRONIC 03/24/2007   PCP:  [name], NP Pharmacy:   [facility], [place] [address] Phone: [phone] Fax:  [phone]  [facility] [place], [place] [address] Phone: [phone] Fax: [phone]  [id number] [place] [place]Phone: [phone]: [phone]    Social Determinants of Health (SDOH) Interventions    Readmission Risk Interventions     No data to display

## 2022-04-15 DIAGNOSIS — J441 Chronic obstructive pulmonary disease with (acute) exacerbation: Secondary | ICD-10-CM | POA: Diagnosis not present

## 2022-04-15 LAB — GLUCOSE, CAPILLARY
Glucose-Capillary: 158 mg/dL — ABNORMAL HIGH (ref 70–99)
Glucose-Capillary: 226 mg/dL — ABNORMAL HIGH (ref 70–99)
Glucose-Capillary: 144 mg/dL — ABNORMAL HIGH (ref 70–99)
Glucose-Capillary: 133 mg/dL — ABNORMAL HIGH (ref 70–99)

## 2022-04-15 MED ORDER — IPRATROPIUM-ALBUTEROL 0.5-2.5 (3) MG/3ML IN SOLN
3.0000 mL | Freq: Two times a day (BID) | RESPIRATORY_TRACT | Status: DC
  Administered 2022-04-15 – 2022-04-24 (×19): 3 mL via RESPIRATORY_TRACT
  Filled 2022-04-15 (×21): qty 3

## 2022-04-15 MED ORDER — IPRATROPIUM-ALBUTEROL 0.5-2.5 (3) MG/3ML IN SOLN: 3.0000 mL | Freq: Four times a day (QID) | RESPIRATORY_TRACT | Status: DC

## 2022-04-15 MED ORDER — LIDOCAINE 5 % EX PTCH
1.0000 | MEDICATED_PATCH | Freq: Every day | CUTANEOUS | Status: DC
  Administered 2022-04-15 – 2022-05-12 (×28): 1 via TRANSDERMAL
  Filled 2022-04-15 (×28): qty 1

## 2022-04-15 MED ORDER — PREDNISONE 20 MG PO TABS
40.0000 mg | ORAL_TABLET | Freq: Every day | ORAL | Status: DC
  Administered 2022-04-15 – 2022-04-22 (×8): 40 mg via ORAL
  Filled 2022-04-15 (×8): qty 2

## 2022-04-15 NOTE — Progress Notes (Addendum)
PROGRESS NOTE    EDA MAGNUSSEN  KZL:[id number] DOB: 06/27/1950 DOA: 04/09/2022 PCP: [name], NP   Brief Narrative: 71 past medical history significant for COPD stage IV, chronic hypoxic respiratory failure 2 to 3 L, moderate pulmonary hypertension, breast cancer status post left lumpectomy and hormone therapy, anxiety, depression presented with worsening shortness of breath and cough symptoms started 2 or 3 days prior to admission.  She was noted to be hypoxic requiring 6 L of oxygen.  She received IV Solu-Medrol in the ED.  Patient oxygen saturation dropped to the 70s this morning and she was in respiratory distress she was placed on BiPAP.  She has been slow to improved. She is still requiring 6 L oxygen. CT angio negative for PE.    Assessment & Plan: Principal Problem:   COPD (chronic obstructive pulmonary disease) (HCC) Active Problems:   Depression   GERD (gastroesophageal reflux disease)   Acute on chronic respiratory failure with hypoxia (HCC)   Ductal carcinoma in situ (DCIS) of left breast   Hyponatremia  1-Acute on Chronic Hypoxic Respiratory Failure in the setting of acute COPD exacerbation Chest x ray: COPD changes with severe bibasilar chronic interstitial lung disease.  Question pulmonary arterial hypertension. -Continue with BiPAP PRN. She  has  not required BPAP>  -Pulmonologist consulted. Recommend to provide prescription for Stiolto at discharge.  -Completed doxycycline day 5/5 -Continue with Brovana and Incruse.  Scheduled nebulizer.  Continue with guaifenesin -She didn't required BIPAP last night. Not at baseline, still SOB.  -Continue flutter valve.  -Chest x ray stable. She is tachycardic today back on 6 L oxygen, -CTA negative for PE, showed chronic tracheobronchomalacia. Discussed with pulmonologist patient will required follow up out patient for further care of Tracheomalacia; Stent vs CPAP/BPAP.  -CM trying to get BIPAP for patient.  -Needs to  be out bed, ambulation.   Anxiety, Depression: Continue with Ativan. Continue with olanzapine.  HTN; BP elevated. Started  Norvasc.   Pulmonary hypertension: Pulmonology consulted BNP 22.   History of breast cancer status postlumpectomy: Continue with anastrozole.  Mild Hyponatremia; monitor.  Elevation CBG; suspect in setting steroids. Monitor.  GERD; PPI Constipation: Had BM. Hyperkalemia; Resolved. Hemolyze specimen.   Estimated body mass index is 26.21 kg/m as calculated from the following:   Height as of this encounter: '5\' 2"'$  (1.575 m).   Weight as of this encounter: 65 kg.   DVT prophylaxis: Lovenox Code Status: Full code Family Communication: Daughter 11/21. Disposition Plan:  Status is: Inpatient Remains inpatient appropriate because: management of Resp failure, COPD exacerbation.     Consultants:  Pulmonologist   Procedures:    Antimicrobials:    Subjective: She denies worsening shortness of breath.  She is still on 6 L oxygen. Discussed with nurse plan to wean oxygen down as tolerated.   Objective: Vitals:   04/15/22 0511 04/15/22 0726 04/15/22 0832 04/15/22 1002  BP: (!) 148/82 130/70 113/70 118/73  Pulse: 95 68 97 96  Resp: '15 14  17  '$ Temp: 97.7 F (36.5 C) 98 F (36.7 C) 97.8 F (36.6 C)   TempSrc: Oral Oral Oral   SpO2: 91% 100% 96% 94%  Weight:      Height:        Intake/Output Summary (Last 24 hours) at 04/15/2022 1230 Last data filed at 04/15/2022 0900 Gross per 24 hour  Intake 480 ml  Output 750 ml  Net -270 ml    Filed Weights   04/09/22 0734 04/10/22 1242  Weight: 65.8 kg 65 kg    Examination:  General exam:  NAD Respiratory system: BL air movement.  Cardiovascular system: S 1, S 2 RRR Gastrointestinal system: BS present, soft, nt Central nervous system: Alert, follows command Extremities: No edema   Data Reviewed: I have personally reviewed following labs and imaging studies  CBC: Recent Labs  Lab  04/09/22 0745 04/10/22 0220 04/13/22 0115  WBC 12.2* 11.8* 14.1*  HGB 13.6 14.4 14.8  HCT 42.7 44.1 46.2*  MCV 96.4 94.4 96.0  PLT 215 272 148*    Basic Metabolic Panel: Recent Labs  Lab 04/09/22 0745 04/10/22 0220 04/12/22 0108 04/13/22 0641  NA 136 133* 136 135  K 3.8 4.5 5.3* 4.5  CL 100 96* 95* 93*  CO2 '23 24 28 26  '$ GLUCOSE 181* 147* 117* 129*  BUN 13 15 25* 32*  CREATININE 0.69 0.69 0.79 0.92  CALCIUM 8.1* 9.4 9.5 9.5    GFR: Estimated Creatinine Clearance: 50.4 mL/min (by C-G formula based on SCr of 0.92 mg/dL). Liver Function Tests: Recent Labs  Lab 04/09/22 0745  AST 25  ALT 25  ALKPHOS 104  BILITOT 1.0  PROT 6.2*  ALBUMIN 3.6    No results for input(s): "LIPASE", "AMYLASE" in the last 168 hours. No results for input(s): "AMMONIA" in the last 168 hours. Coagulation Profile: No results for input(s): "INR", "PROTIME" in the last 168 hours. Cardiac Enzymes: No results for input(s): "CKTOTAL", "CKMB", "CKMBINDEX", "TROPONINI" in the last 168 hours. BNP (last 3 results) No results for input(s): "PROBNP" in the last 8760 hours. HbA1C: No results for input(s): "HGBA1C" in the last 72 hours. CBG: Recent Labs  Lab 04/14/22 0637 04/14/22 1104 04/14/22 1700 04/14/22 2125 04/15/22 0608  GLUCAP 133* 135* 130* 144* 158*    Lipid Profile: No results for input(s): "CHOL", "HDL", "LDLCALC", "TRIG", "CHOLHDL", "LDLDIRECT" in the last 72 hours. Thyroid Function Tests: No results for input(s): "TSH", "T4TOTAL", "FREET4", "T3FREE", "THYROIDAB" in the last 72 hours. Anemia Panel: No results for input(s): "VITAMINB12", "FOLATE", "FERRITIN", "TIBC", "IRON", "RETICCTPCT" in the last 72 hours. Sepsis Labs: No results for input(s): "PROCALCITON", "LATICACIDVEN" in the last 168 hours.  Recent Results (from the past 240 hour(s))  Resp Panel by RT-PCR (Flu A&B, Covid) Anterior Nasal Swab     Status: None   Collection Time: 04/09/22  7:45 AM   Specimen: Anterior  Nasal Swab  Result Value Ref Range Status   SARS Coronavirus 2 by RT PCR NEGATIVE NEGATIVE Final    Comment: (NOTE) SARS-CoV-2 target nucleic acids are NOT DETECTED.  The SARS-CoV-2 RNA is generally detectable in upper respiratory specimens during the acute phase of infection. The lowest concentration of SARS-CoV-2 viral copies this assay can detect is 138 copies/mL. A negative result does not preclude SARS-Cov-2 infection and should not be used as the sole basis for treatment or other patient management decisions. A negative result may occur with  improper specimen collection/handling, submission of specimen other than nasopharyngeal swab, presence of viral mutation(s) within the areas targeted by this assay, and inadequate number of viral copies(<138 copies/mL). A negative result must be combined with clinical observations, patient history, and epidemiological information. The expected result is Negative.  Fact Sheet for Patients:  EntrepreneurPulse.com.au  Fact Sheet for Healthcare Providers:  IncredibleEmployment.be  This test is no t yet approved or cleared by the [place] FDA and  has been authorized for detection and/or diagnosis of SARS-CoV-2 by FDA under an Emergency Use Authorization (EUA). This EUA will  remain  in effect (meaning this test can be used) for the duration of the COVID-19 declaration under Section 564(b)(1) of the Act, 21 U.S.C.section 360bbb-3(b)(1), unless the authorization is terminated  or revoked sooner.       Influenza A by PCR NEGATIVE NEGATIVE Final   Influenza B by PCR NEGATIVE NEGATIVE Final    Comment: (NOTE) The Xpert Xpress SARS-CoV-2/FLU/RSV plus assay is intended as an aid in the diagnosis of influenza from Nasopharyngeal swab specimens and should not be used as a sole basis for treatment. Nasal washings and aspirates are unacceptable for Xpert Xpress SARS-CoV-2/FLU/RSV testing.  Fact Sheet for  Patients: EntrepreneurPulse.com.au  Fact Sheet for Healthcare Providers: IncredibleEmployment.be  This test is not yet approved or cleared by the [place] FDA and has been authorized for detection and/or diagnosis of SARS-CoV-2 by FDA under an Emergency Use Authorization (EUA). This EUA will remain in effect (meaning this test can be used) for the duration of the COVID-19 declaration under Section 564(b)(1) of the Act, 21 U.S.C. section 360bbb-3(b)(1), unless the authorization is terminated or revoked.  Performed at [facility], [place] [address], [place],  [address]          Radiology Studies: CT Angio Chest Pulmonary Embolism (PE) W or WO Contrast  Result Date: 04/14/2022 CLINICAL DATA:  Evaluate for acute pulmonary embolus. EXAM: CT ANGIOGRAPHY CHEST WITH CONTRAST TECHNIQUE: Multidetector CT imaging of the chest was performed using the standard protocol during bolus administration of intravenous contrast. Multiplanar CT image reconstructions and MIPs were obtained to evaluate the vascular anatomy. RADIATION DOSE REDUCTION: This exam was performed according to the departmental dose-optimization program which includes automated exposure control, adjustment of the mA and/or kV according to patient size and/or use of iterative reconstruction technique. CONTRAST:  8m OMNIPAQUE IOHEXOL 350 MG/ML SOLN COMPARISON:  04/17/2010 FINDINGS: Cardiovascular: Satisfactory opacification of the pulmonary arteries to the segmental level. No evidence of pulmonary embolism. Aortic atherosclerosis. Coronary artery calcifications. Normal heart size. No pericardial effusion. Mediastinum/Nodes: Thyroid gland, and esophagus appear unremarkable. There is decreased AP diameter of the trachea and bilateral mainstem bronchi concerning for chronic tracheobronchomalacia. No enlarged axillary, mediastinal, or hilar lymph nodes. Lungs/Pleura: Severe changes of  emphysema. No pleural effusion. No airspace consolidation. Subpleural scarring noted within the posterolateral right middle lobe. Upper Abdomen: No acute findings. Small hiatal hernia. Status post cholecystectomy. Musculoskeletal: No acute or suspicious osseous findings. Mild chronic endplate deformities noted throughout the thoracic spine. Review of the MIP images confirms the above findings. IMPRESSION: 1. No evidence for acute pulmonary embolism. 2. Decreased AP diameter of the trachea and bilateral mainstem bronchi concerning for chronic tracheobronchomalacia. 3. Diffuse bronchial wall thickening with severe emphysema, as above; imaging findings suggestive of underlying COPD. Emphysema (ICD10-J43.9). 4. Coronary artery calcifications. 5. Small hiatal hernia. 6.  Aortic Atherosclerosis (ICD10-I70.0). Electronically Signed   By: TKerby MoorsM.D.   On: 04/14/2022 10:24        Scheduled Meds:  albuterol  10 mg/hr Nebulization Once   amLODipine  2.5 mg Oral Daily   anastrozole  1 mg Oral Daily   arformoterol  15 mcg Nebulization BID   And   umeclidinium bromide  1 puff Inhalation Daily   enoxaparin (LOVENOX) injection  40 mg Subcutaneous Q24H   guaiFENesin  1,200 mg Oral BID   ipratropium-albuterol  3 mL Nebulization BID   lidocaine  1 patch Transdermal Daily   OLANZapine  7.5 mg Oral Daily   pantoprazole  40 mg  Oral BID   polyethylene glycol  17 g Oral BID   predniSONE  40 mg Oral Q breakfast   senna-docusate  1 tablet Oral BID   Continuous Infusions:   LOS: 6 days    Time spent: 35 minutes    Ismail Graziani A Dala Breault, MD [facility]   If 7PM-7AM, please contact night-coverage [email or web address]  04/15/2022, 12:30 PM

## 2022-04-15 NOTE — Progress Notes (Signed)
RT at bedside for morning tx. RT found pt on 5L [place] with BiPAP on stby. RT will continue to monitor pt.

## 2022-04-15 NOTE — Progress Notes (Signed)
SATURATION QUALIFICATIONS: (This note is used to comply with regulatory documentation for home oxygen)  Patient Saturations on Room Air at Rest = 88%  Patient Saturations on Room Air while Ambulating = n/a  Patient Saturations on 6 Liters of oxygen while Ambulating = 91%  Please briefly explain why patient needs home oxygen: 

## 2022-04-15 NOTE — TOC Progression Note (Addendum)
Transition of Care (TOC) - Progression Note  Valentina Gu, BSN Transitions of Care Unit 4E- RN Case Manager See Treatment Team for direct phone #   Patient Details  Name: Abigail Castillo MRN: [id number] Date of Birth: Jul 13, 1950  Transition of Care [facility]) CM/SW Contact  Dahlia Client Romeo Rabon, RN Phone Number: 04/15/2022, 3:20 PM  Clinical Narrative:    Follow up done with pt for [facility] choice, Pt has reviewed list and has selected [facility] as her first choice, with [facility] for backup.   Also discussed with pt potential need for CPAP/Bipap/NIV per MD as well as increased home 02 needs. Pt voiced understanding and CM will follow up with [employer] for these needs as well as nebulizer for home.   Call made to Jeneen Rinks w/ [employer] to discuss CPAP need- per [facility] is not likely to approve CPAP however may approve BIPAP vs NIV- do not see ABG on this admit and pt would need this to see if she can meet the need for insurance guidelines. Msg has been sent to MD- and Order for ABG placed.   [facility] will follow ABG results- Also spke w/ Erasmo Downer at [employer] who provided further info on insurance coverage- looks like BIPAP might be covered with no prior auth required, [employer] plans would be to seek BIPAP at discharge so pt has something to go home one, and pending ABG results can submit order for NIV and see if insurance will approve- (may take up to a week to get NIV approved). [employer] will f/u pending ABG results. MD aware and will come sign the order for BIPAP that has been placed on shadow chart.   Call made to [facility] w/ [place] regarding [facility] referral- staffing is tight this week due to Thanksgiving holiday- start of care would be first of next week- CM will reach back out to [facility] prior to discharge to confirm if they can accept.    Expected Discharge Plan: [place] Barriers to Discharge: Continued Medical Work up  Expected Discharge Plan and Services Expected Discharge  Plan: [place]   Discharge Planning Services: CM Consult Post Acute Care Choice: Durable Medical Equipment, Home Health Living arrangements for the past 2 months: Mobile Home                 DME Arranged: Nebulizer machine, Oxygen, Bipap DME Agency: [facility] Date DME Agency Contacted: 04/15/22 Time DME Agency Contacted: 53 Representative spoke with at DME Agency: Erasmo Downer HH Arranged: PT           Social Determinants of Health ([place]) Interventions    Readmission Risk Interventions     No data to display

## 2022-04-15 NOTE — Progress Notes (Signed)
RT attempted ABG x2 and was unsuccessful. RT notified MD.

## 2022-04-15 NOTE — Progress Notes (Signed)
Overnight pulse ox study set up on patient at this time.  RT will retrieve machine in the am and download results for MD.

## 2022-04-15 NOTE — Plan of Care (Signed)
  Problem: Education: Goal: Knowledge of disease or condition will improve Outcome: Progressing Goal: Knowledge of the prescribed therapeutic regimen will improve Outcome: Progressing   Problem: Activity: Goal: Ability to tolerate increased activity will improve Outcome: Progressing   Problem: Respiratory: Goal: Ability to maintain a clear airway will improve Outcome: Progressing Goal: Levels of oxygenation will improve Outcome: Progressing Goal: Ability to maintain adequate ventilation will improve Outcome: Progressing

## 2022-04-15 NOTE — Progress Notes (Signed)
Mobility Specialist Progress Note   04/15/22 1030  Mobility  Activity Ambulated with assistance in hallway;Ambulated with assistance in room;Transferred from bed to chair (in bed before to chair after)  Level of Assistance Standby assist, set-up cues, supervision of patient - no hands on (for safety)  Assistive Device Front wheel walker  Distance Ambulated (ft) 20 ft  Range of Motion/Exercises Active;All extremities  Activity Response Tolerated fair   Pre Ambulation:  HR 88, BP 118/73,  SpO2 93% 4LO2 HFNC During Ambulation: HR 105, SpO2 90-91% 6LO2 HFNC Post Ambulation: HR 87, SpO2 94% 4LO2 HFNC  Patient received in supine eager to participate. Required extra time + minimal HHA to EOB from supine to sit. Ambulated short distance before requiring a seated rest break secondary to SOB. During ambulation, oxygen saturation lingered mid-low 80's on 4LO2 but maintained >91% on 6LO2. Distance limited to fatigue and SOB, patient deferred further ambulation and returned to room without incident. Was left in recliner with all needs met, call bell in reach.   Abigail Castillo, BS EXP Mobility Specialist Please contact via SecureChat or Rehab office at [phone]

## 2022-04-16 DIAGNOSIS — J441 Chronic obstructive pulmonary disease with (acute) exacerbation: Secondary | ICD-10-CM | POA: Diagnosis not present

## 2022-04-16 LAB — GLUCOSE, CAPILLARY
Glucose-Capillary: 114 mg/dL — ABNORMAL HIGH (ref 70–99)
Glucose-Capillary: 125 mg/dL — ABNORMAL HIGH (ref 70–99)
Glucose-Capillary: 203 mg/dL — ABNORMAL HIGH (ref 70–99)
Glucose-Capillary: 139 mg/dL — ABNORMAL HIGH (ref 70–99)

## 2022-04-16 LAB — CBC
HCT: 45.6 % (ref 36.0–46.0)
Hemoglobin: 14.6 g/dL (ref 12.0–15.0)
MCH: 30.7 pg (ref 26.0–34.0)
MCHC: 32 g/dL (ref 30.0–36.0)
MCV: 95.8 fL (ref 80.0–100.0)
Platelets: 239 10*3/uL (ref 150–400)
RBC: 4.76 MIL/uL (ref 3.87–5.11)
RDW: 13 % (ref 11.5–15.5)
WBC: 15 10*3/uL — ABNORMAL HIGH (ref 4.0–10.5)
nRBC: 0 % (ref 0.0–0.2)

## 2022-04-16 LAB — BASIC METABOLIC PANEL
Anion gap: 7 (ref 5–15)
BUN: 19 mg/dL (ref 8–23)
CO2: 34 mmol/L — ABNORMAL HIGH (ref 22–32)
Calcium: 9.4 mg/dL (ref 8.9–10.3)
Chloride: 96 mmol/L — ABNORMAL LOW (ref 98–111)
Creatinine, Ser: 0.61 mg/dL (ref 0.44–1.00)
GFR, Estimated: >60 mL/min (ref >60)
Glucose, Bld: 113 mg/dL — ABNORMAL HIGH (ref 70–99)
Potassium: 3.9 mmol/L (ref 3.5–5.1)
Sodium: 137 mmol/L (ref 135–145)

## 2022-04-16 NOTE — Progress Notes (Signed)
Abigail Castillo  WIO:[id number] DOB: Aug 08, 1950 DOA: 04/09/2022 PCP: [name], NP    Brief Narrative:  71 year old with a history of COPD, chronic hypoxic respiratory failure on 2-3 L nasal cannula home oxygen support, moderate pulmonary hypertension, breast cancer status postlumpectomy and HRT, anxiety, and depression who presented with worsening shortness of breath for 2 to 3 days.  Consultants:  Pulmonary  Goals of Care:  Code Status: Full Code   DVT prophylaxis: Lovenox  Interim Hx: Afebrile.  Vital signs stable.  Continues to require 4 L high flow nasal cannula oxygen support.  At the time of my visit she has just been set up at the side of the bed with therapy.  She is markedly dyspneic and tachypneic.  She is not however in extremis.  Assessment & Plan:  Acute exacerbation of COPD with acute on chronic hypoxic respiratory failure As evidenced by tachypnea and requirement for BiPAP -has completed a 5-day course of doxycycline -continue Brovana and Incruse and changed to Stiolto at discharge -CTa negative for PE -still requiring an elevated level of oxygen support and therefore not yet ready for discharge home -attempting to arrange for home BiPAP -Home oxygen support requirement at baseline is reportedly 2-3 L  Tracheobronchomalacia Noted on CT during this admission -will need outpatient pulmonary follow-up -TRH is attempting to arrange home BiPAP  Anxiety/depression Continue olanzapine and Ativan  HTN Blood pressure presently well controlled  History of breast cancer status post-lumpectomy Continue anastrozole   Family Communication: No family present at time of exam Disposition: From home -eventual discharge home when able to be weaned down further on oxygen support   Objective: Blood pressure (!) 157/77, pulse 97, temperature 97.8 F (36.6 C), temperature source Oral, resp. rate 15, height '5\' 2"'$  (1.575 m), weight 65 kg, SpO2 100 %.  Intake/Output  Summary (Last 24 hours) at 04/16/2022 0901 Last data filed at 04/16/2022 0558 Gross per 24 hour  Intake 480 ml  Output 2125 ml  Net -1645 ml   Filed Weights   04/09/22 0734 04/10/22 1242  Weight: 65.8 kg 65 kg    Examination: General: Tachypnea and significant dyspnea upon sitting up at bedside Lungs: No focal crackles -poor air movement in all fields -mild expiratory wheezing Cardiovascular: Regular rate and rhythm without murmur gallop or rub normal S1 and S2 Abdomen: Nontender, nondistended, soft, bowel sounds positive, no rebound, no ascites, no appreciable mass Extremities: No significant cyanosis, clubbing, or edema bilateral lower extremities  CBC: Recent Labs  Lab 04/10/22 0220 04/13/22 0115 04/16/22 0153  WBC 11.8* 14.1* 15.0*  HGB 14.4 14.8 14.6  HCT 44.1 46.2* 45.6  MCV 94.4 96.0 95.8  PLT 272 148* 426   Basic Metabolic Panel: Recent Labs  Lab 04/12/22 0108 04/13/22 0641 04/16/22 0153  NA 136 135 137  K 5.3* 4.5 3.9  CL 95* 93* 96*  CO2 28 26 34*  GLUCOSE 117* 129* 113*  BUN 25* 32* 19  CREATININE 0.79 0.92 0.61  CALCIUM 9.5 9.5 9.4   GFR: Estimated Creatinine Clearance: 58 mL/min (by C-G formula based on SCr of 0.61 mg/dL).   Scheduled Meds:  albuterol  10 mg/hr Nebulization Once   amLODipine  2.5 mg Oral Daily   anastrozole  1 mg Oral Daily   arformoterol  15 mcg Nebulization BID   And   umeclidinium bromide  1 puff Inhalation Daily   enoxaparin (LOVENOX) injection  40 mg Subcutaneous Q24H   guaiFENesin  1,200 mg Oral BID  ipratropium-albuterol  3 mL Nebulization BID   lidocaine  1 patch Transdermal Daily   OLANZapine  7.5 mg Oral Daily   pantoprazole  40 mg Oral BID   polyethylene glycol  17 g Oral BID   predniSONE  40 mg Oral Q breakfast   senna-docusate  1 tablet Oral BID      LOS: 7 days   Cherene Altes, MD [facility] Office  [phone] Pager - Text Page per Shea Evans  If 7PM-7AM, please contact night-coverage  per Amion 04/16/2022, 9:01 AM

## 2022-04-16 NOTE — Progress Notes (Signed)
Mobility Specialist Progress Note:   04/16/22 0930  Mobility  Activity Ambulated with assistance in hallway  Level of Assistance Standby assist, set-up cues, supervision of patient - no hands on  Assistive Device Front wheel walker  Distance Ambulated (ft) 30 ft  Activity Response Tolerated well  $Mobility charge 1 Mobility   Pt received in bed willing to participate in mobility. No complaints of pain. Left in chair with call bell in reach and all needs met.   Gareth Eagle Pavneet [place] Mobility Specialist Please contact via [employer] or  Rehab Office at [phone]

## 2022-04-16 NOTE — Progress Notes (Signed)
Physical Therapy Treatment Patient Details Name: Abigail Castillo MRN: [id number] DOB: November 19, 1950 Today's Date: 04/16/2022   History of Present Illness Pt is a 71 y.o. F who presents 04/09/2022 with COPD with acute exacerbation. Significant PMH: COPD, chronic respiratory failure with hypoxemia, COVID-19, osteoporosis, tobacco abuse.    PT Comments    Pt continues to demonstrate slow, but steady gait, preferably with UE support. However, remains limited by muscular and aerobic endurance deficits, requiring up to 6L O2 via  to rebound sats to 90s% with seated rest break after trying to ambulate on 3L O2 with SpO2 dropping to the 70s%. Pt can only tolerate standing for a couple minutes before fatiguing. Will continue to follow acutely. Updated equipment recs to include shower stool/tub bench as pt does not have the endurance to stand for an entire shower at this time.     Recommendations for follow up therapy are one component of a multi-disciplinary discharge planning process, led by the attending physician.  Recommendations may be updated based on patient status, additional functional criteria and insurance authorization.  Follow Up Recommendations  Home health PT     Assistance Recommended at Discharge PRN  Patient can return home with the following Assistance with cooking/housework;Assist for transportation;Help with stairs or ramp for entrance   Equipment Recommendations  Other (comment) (shower stool/tub bench)    Recommendations for Other Services       Precautions / Restrictions Precautions Precautions: Other (comment);Fall Precaution Comments: watch O2 Restrictions Weight Bearing Restrictions: No     Mobility  Bed Mobility Overal bed mobility: Needs Assistance Bed Mobility: Sit to Supine       Sit to supine: Supervision, HOB elevated   General bed mobility comments: Supervision for safety and line management to return to supine    Transfers Overall transfer  level: Needs assistance Equipment used: Rollator (4 wheels), 1 person hand held assist Transfers: Sit to/from Stand Sit to Stand: Supervision, Min guard           General transfer comment: Pt requesting HHA intermittently but able to complete without just using rollator, min guard-supervision for safety, x9 reps    Ambulation/Gait Ambulation/Gait assistance: Supervision Gait Distance (Feet): 20 Feet (x3 bouts of ~7 ft > ~20 ft > ~3 ft) Assistive device: Rollator (4 wheels), 1 person hand held assist Gait Pattern/deviations: Step-through pattern, Decreased stride length Gait velocity: decreased Gait velocity interpretation: <1.31 ft/sec, indicative of household ambulator   General Gait Details: Supervision for safety and lines, intermittently holding onto therapist and then transitioning to rollator. No LOB, slow but mostly steady, limited by endurance deficits   Stairs             Wheelchair Mobility    Modified Rankin (Stroke Patients Only)       Balance Overall balance assessment: Needs assistance Sitting-balance support: No upper extremity supported Sitting balance-Leahy Scale: Good     Standing balance support: Bilateral upper extremity supported, No upper extremity supported Standing balance-Leahy Scale: Fair Standing balance comment: Able to stand statically without UE support, but prefers UE support                            Cognition Arousal/Alertness: Awake/alert Behavior During Therapy: Flat affect, WFL for tasks assessed/performed Overall Cognitive Status: No family/caregiver present to determine baseline cognitive functioning  General Comments: Following all commands. At times, not responding to questions and requires repeating several times. Slow processing. Unsure if does not respond due to SOB or desire.        Exercises Other Exercises Other Exercises: sit <> stand 7x EOB with HHA     General Comments General comments (skin integrity, edema, etc.): SpO2 down to 70s% on 3L, needing 6L to rebound to 90s% with seated rest breaks      Pertinent Vitals/Pain Pain Assessment Pain Assessment: Faces Faces Pain Scale: Hurts a little bit Pain Location: low back Pain Descriptors / Indicators: Discomfort, Grimacing Pain Intervention(s): Limited activity within patient's tolerance, Monitored during session, Repositioned    Home Living                          Prior Function            PT Goals (current goals can now be found in the care plan section) Acute Rehab PT Goals Patient Stated Goal: to improve breathing PT Goal Formulation: With patient Time For Goal Achievement: 04/26/22 Potential to Achieve Goals: Good Progress towards PT goals: Progressing toward goals    Frequency    Min 3X/week      PT Plan Equipment recommendations need to be updated    Co-evaluation              AM-PAC PT "6 Clicks" Mobility   Outcome Measure  Help needed turning from your back to your side while in a flat bed without using bedrails?: None Help needed moving from lying on your back to sitting on the side of a flat bed without using bedrails?: A Little Help needed moving to and from a bed to a chair (including a wheelchair)?: A Little Help needed standing up from a chair using your arms (e.g., wheelchair or bedside chair)?: A Little Help needed to walk in hospital room?: A Little Help needed climbing 3-5 steps with a railing? : A Little 6 Click Score: 19    End of Session Equipment Utilized During Treatment: Gait belt;Oxygen Activity Tolerance: Patient tolerated treatment well Patient left: in bed;with call bell/phone within reach;with bed alarm set   PT Visit Diagnosis: Difficulty in walking, not elsewhere classified (R26.2);Unsteadiness on feet (R26.81);Other abnormalities of gait and mobility (R26.89)     Time: 0086-7619 PT Time Calculation (min)  (ACUTE ONLY): 35 min  Charges:  $Gait Training: 8-22 mins $Therapeutic Exercise: 8-22 mins                     Moishe Spice, PT, DPT Acute Rehabilitation Services  Office: [place] 04/16/2022, 12:49 PM

## 2022-04-17 DIAGNOSIS — J441 Chronic obstructive pulmonary disease with (acute) exacerbation: Secondary | ICD-10-CM | POA: Diagnosis not present

## 2022-04-17 LAB — GLUCOSE, CAPILLARY
Glucose-Capillary: 106 mg/dL — ABNORMAL HIGH (ref 70–99)
Glucose-Capillary: 194 mg/dL — ABNORMAL HIGH (ref 70–99)
Glucose-Capillary: 183 mg/dL — ABNORMAL HIGH (ref 70–99)
Glucose-Capillary: 164 mg/dL — ABNORMAL HIGH (ref 70–99)

## 2022-04-17 NOTE — Progress Notes (Signed)
Patient with some increasing shortness of breath this afternoon, upon patient lung assessment patient with wheezing, and congested cough. patient oxygen on 4L 86-87% oxygen increased to 5L [place], respiratory made aware and breathing treatment administered.  Call bell within reach. Cierra Rothgeb, Bettina Gavia RN

## 2022-04-17 NOTE — Progress Notes (Signed)
Abigail Castillo  NGE:[id number] DOB: 19-Jul-1950 DOA: 04/09/2022 PCP: [name], NP    Brief Narrative:  71 year old with a history of COPD, chronic hypoxic respiratory failure on 2-3 L nasal cannula home oxygen support, moderate pulmonary hypertension, breast cancer status postlumpectomy and HRT, anxiety, and depression who presented with worsening shortness of breath for 2 to 3 days.  Consultants:  Pulmonary  Goals of Care:  Code Status: Full Code   DVT prophylaxis: Lovenox  Interim Hx: No acute events reported overnight.  Afebrile.  Vital signs stable.  Some mild sinus tachycardia with heart rates up to 107.  Saturations 96-99% on 4 L conventional nasal cannula.  Has required a slight increase in her oxygen support this afternoon.  Not making much progress at present.  Assessment & Plan:  Acute exacerbation of severe COPD with acute on chronic hypoxic respiratory failure As evidenced by tachypnea and requirement for BiPAP - has completed a 5-day course of doxycycline - continue Brovana and Incruse and change to Stiolto at discharge - CTa negative for PE - still requiring an elevated level of oxygen support and therefore not yet ready for discharge home - attempting to arrange for home BiPAP - home oxygen support requirement at baseline is reportedly 2-3 L  Tracheobronchomalacia Noted on CT during this admission - will need outpatient Pulmonary follow-up -TRH is attempting to arrange home BiPAP  Anxiety/depression Continue olanzapine and Ativan  HTN Blood pressure presently well controlled  History of breast cancer status post-lumpectomy Continue anastrozole   Family Communication: No family present at time of exam Disposition: From home - eventual discharge home when able to be weaned down further on oxygen support   Objective: Blood pressure (!) 141/70, pulse (!) 107, temperature 97.7 F (36.5 C), temperature source Oral, resp. rate 17, height '5\' 2"'$  (1.575 m),  weight 65 kg, SpO2 99 %.  Intake/Output Summary (Last 24 hours) at 04/17/2022 0843 Last data filed at 04/16/2022 2150 Gross per 24 hour  Intake 690 ml  Output 1500 ml  Net -810 ml    Filed Weights   04/09/22 0734 04/10/22 1242  Weight: 65.8 kg 65 kg    Examination: General: Tachypnea and significant dyspnea upon sitting up at bedside Lungs: No focal crackles -poor air movement in all fields with only distant breath sounds appreciable-mild expiratory wheezing Cardiovascular: Regular rate and rhythm without murmur -distant heart sounds Abdomen: Nontender, nondistended, soft, bowel sounds positive, Extremities: No significant edema bilateral lower extremities  CBC: Recent Labs  Lab 04/13/22 0115 04/16/22 0153  WBC 14.1* 15.0*  HGB 14.8 14.6  HCT 46.2* 45.6  MCV 96.0 95.8  PLT 148* 401    Basic Metabolic Panel: Recent Labs  Lab 04/12/22 0108 04/13/22 0641 04/16/22 0153  NA 136 135 137  K 5.3* 4.5 3.9  CL 95* 93* 96*  CO2 28 26 34*  GLUCOSE 117* 129* 113*  BUN 25* 32* 19  CREATININE 0.79 0.92 0.61  CALCIUM 9.5 9.5 9.4    GFR: Estimated Creatinine Clearance: 58 mL/min (by C-G formula based on SCr of 0.61 mg/dL).   Scheduled Meds:  amLODipine  2.5 mg Oral Daily   anastrozole  1 mg Oral Daily   arformoterol  15 mcg Nebulization BID   And   umeclidinium bromide  1 puff Inhalation Daily   enoxaparin (LOVENOX) injection  40 mg Subcutaneous Q24H   guaiFENesin  1,200 mg Oral BID   ipratropium-albuterol  3 mL Nebulization BID   lidocaine  1 patch  Transdermal Daily   OLANZapine  7.5 mg Oral Daily   pantoprazole  40 mg Oral BID   polyethylene glycol  17 g Oral BID   predniSONE  40 mg Oral Q breakfast   senna-docusate  1 tablet Oral BID      LOS: 8 days   Cherene Altes, MD [facility] Office  [phone] Pager - Text Page per Shea Evans  If 7PM-7AM, please contact night-coverage per Amion 04/17/2022, 8:43 AM

## 2022-04-17 NOTE — Progress Notes (Signed)
Mobility Specialist Progress Note:   04/17/22 1037  Mobility  Activity Ambulated with assistance in hallway  Level of Assistance Standby assist, set-up cues, supervision of patient - no hands on  Assistive Device Front wheel walker  Distance Ambulated (ft) 50 ft  Activity Response Tolerated well  $Mobility charge 1 Mobility   Pt received EOB willing to participate in mobility. No complaints of pain. Left in chair with call bell in reach and all needs met.  Gareth Eagle Cheryn Lundquist Mobility Specialist Please contact via [facility] [employer] or  Rehab Office at [phone]

## 2022-04-18 DIAGNOSIS — J441 Chronic obstructive pulmonary disease with (acute) exacerbation: Secondary | ICD-10-CM | POA: Diagnosis not present

## 2022-04-18 LAB — GLUCOSE, CAPILLARY
Glucose-Capillary: 111 mg/dL — ABNORMAL HIGH (ref 70–99)
Glucose-Capillary: 143 mg/dL — ABNORMAL HIGH (ref 70–99)
Glucose-Capillary: 146 mg/dL — ABNORMAL HIGH (ref 70–99)

## 2022-04-18 NOTE — TOC Progression Note (Signed)
Transition of Care (TOC) - Progression Note  [name] RN, BSN Transitions of Care Unit 4E- RN Case Manager See Treatment Team for direct phone #   Patient Details  Name: Abigail Castillo MRN: [id number] Date of Birth: [birth year]  Transition of Care [facility] CM/SW Contact  Dahlia Client Romeo Rabon, RN Phone Number: 04/18/2022, 2:30 PM  Clinical Narrative:    TOC continues to follow for transition needs. Note PFTs still pending (these are needed along with narrative from MD for NIV needs)- [facility] is following for Bipap and nebulizer at time of discharge and if needed can follow up on NIV as outpt. Note pt is close to baseline for home 02- if she needed increased liter flow at time of discharge will need new orders along with new qualifying note.   Updated Erasmo Downer w/ [employer] who is following for DME needs.   Call made to [facility] w/ [place] to f/u on HHPT needs and to check and see if they can accept referral- referral has been accepted with a potential discharge for the weekend. Anticipate start of care next week for either Tues/Wed.    TOC will continue to follow  Expected Discharge Plan: [place] Barriers to Discharge: Continued Medical Work up  Expected Discharge Plan and Services Expected Discharge Plan: [place]   Discharge Planning Services: CM Consult Post Acute Care Choice: Durable Medical Equipment, Home Health Living arrangements for the past 2 months: Mobile Home                 DME Arranged: Nebulizer machine, Oxygen, Bipap DME Agency: [facility] Date DME Agency Contacted: 04/15/22 Time DME Agency Contacted: 67 Representative spoke with at DME Agency: Erasmo Downer [place]: PT [place]: [place] Date [place]: 04/18/22 Time [place]: 4 Representative spoke with at [place]: [place] Determinants of Health ([place]) Interventions    Readmission Risk Interventions     No data to  display

## 2022-04-18 NOTE — Progress Notes (Signed)
Physical Therapy Treatment Patient Details Name: Abigail Castillo MRN: [id number] DOB: 1950-08-04 Today's Date: 04/18/2022   History of Present Illness Pt is a 71 y.o. F who presents 04/09/2022 with COPD with acute exacerbation. Significant PMH: COPD, chronic respiratory failure with hypoxemia, COVID-19, osteoporosis, tobacco abuse.    PT Comments    Pt wanting to eat breakfast, agreeable to PT to get OOB prior to eating. Pt ambulatory for room distance with use of RW and supervision level of assist for safety, Pt maintaining SPO2 91% and greater on 4LO2 during mobility. Pt also tolerated repeated sit<>stands to address deconditioning. Plan remains appropriate, PT encouraged pt to mobilize later today with other staff and pt agreeable. PT to continue to follow.      Recommendations for follow up therapy are one component of a multi-disciplinary discharge planning process, led by the attending physician.  Recommendations may be updated based on patient status, additional functional criteria and insurance authorization.  Follow Up Recommendations  Home health PT     Assistance Recommended at Discharge PRN  Patient can return home with the following Assistance with cooking/housework;Assist for transportation;Help with stairs or ramp for entrance   Equipment Recommendations  Other (comment) (shower seat vs tub bench)    Recommendations for Other Services       Precautions / Restrictions Precautions Precautions: Other (comment);Fall Precaution Comments: watch O2 Restrictions Weight Bearing Restrictions: No     Mobility  Bed Mobility Overal bed mobility: Needs Assistance Bed Mobility: Supine to Sit     Supine to sit: Supervision, HOB elevated          Transfers Overall transfer level: Needs assistance Equipment used: Rolling walker (2 wheels) Transfers: Sit to/from Stand Sit to Stand: Supervision           General transfer comment: PT watching lines/leads,  increased time to rise    Ambulation/Gait Ambulation/Gait assistance: Supervision Gait Distance (Feet): 15 Feet Assistive device: Rolling walker (2 wheels) Gait Pattern/deviations: Step-through pattern, Decreased stride length Gait velocity: decr     General Gait Details: safety and lines, pt bumping into objects with little to no correction. Distance limited by pt wanting to eat breakfast   Stairs             Wheelchair Mobility    Modified Rankin (Stroke Patients Only)       Balance Overall balance assessment: Needs assistance Sitting-balance support: No upper extremity supported Sitting balance-Leahy Scale: Good     Standing balance support: No upper extremity supported Standing balance-Leahy Scale: Fair                              Cognition Arousal/Alertness: Awake/alert Behavior During Therapy: Flat affect Overall Cognitive Status: No family/caregiver present to determine baseline cognitive functioning                                 General Comments: follows commands, increased processing time and very flat affect throughout        Exercises Other Exercises Other Exercises: sit<>stands x5 no UE support    General Comments General comments (skin integrity, edema, etc.): SpO2 98% and greater on 4LO2 during gait, dropped to 91% during repeated sit<>stand but recovers to mid-upper 90s with seated rest. pt on 5LO2 on wall      Pertinent Vitals/Pain Pain Assessment Pain Assessment: No/denies pain    Home  Living                          Prior Function            PT Goals (current goals can now be found in the care plan section) Acute Rehab PT Goals Patient Stated Goal: to improve breathing PT Goal Formulation: With patient Time For Goal Achievement: 04/26/22 Potential to Achieve Goals: Good Progress towards PT goals: Progressing toward goals    Frequency    Min 3X/week      PT Plan Current plan  remains appropriate    Co-evaluation              AM-PAC PT "6 Clicks" Mobility   Outcome Measure  Help needed turning from your back to your side while in a flat bed without using bedrails?: None Help needed moving from lying on your back to sitting on the side of a flat bed without using bedrails?: A Little Help needed moving to and from a bed to a chair (including a wheelchair)?: A Little Help needed standing up from a chair using your arms (e.g., wheelchair or bedside chair)?: A Little Help needed to walk in hospital room?: A Little Help needed climbing 3-5 steps with a railing? : A Little 6 Click Score: 19    End of Session Equipment Utilized During Treatment: Oxygen Activity Tolerance: Patient tolerated treatment well Patient left: with call bell/phone within reach;in chair;with chair alarm set Nurse Communication: Mobility status PT Visit Diagnosis: Difficulty in walking, not elsewhere classified (R26.2);Unsteadiness on feet (R26.81);Other abnormalities of gait and mobility (R26.89)     Time: 0160-1093 PT Time Calculation (min) (ACUTE ONLY): 12 min  Charges:  $Therapeutic Activity: 8-22 mins                     Stacie Glaze, PT DPT Acute Rehabilitation Services Pager [phone]  Office [phone]    Roxine Caddy E Ruffin Pyo 04/18/2022, 9:54 AM

## 2022-04-18 NOTE — Progress Notes (Signed)
Mobility Specialist Progress Note:   04/18/22 1427  Mobility  Activity Ambulated with assistance in hallway  Level of Assistance Standby assist, set-up cues, supervision of patient - no hands on  Assistive Device Front wheel walker  Distance Ambulated (ft) 40 ft  Activity Response Tolerated well  $Mobility charge 1 Mobility   Pre- Mobility: 96% SpO2 (5L) During Mobility:91% SpO2 (6L) Post Mobility:  95% SpO2 (5L)  Pt received in chair willing to participate in mobility. No complaints of pain. Left in bed with call ell in reach and all needs met.   Gareth Eagle Laina Guerrieri Mobility Specialist Please contact via [employer] or  Rehab Office at [phone]

## 2022-04-18 NOTE — Progress Notes (Addendum)
Abigail Castillo  ZWC:[id number] DOB: 03-Nov-1950 DOA: 04/09/2022 PCP: [name], NP    Brief Narrative:  71 year old with a history of COPD, chronic hypoxic respiratory failure on 2-3 L nasal cannula home oxygen support, moderate pulmonary hypertension, breast cancer status postlumpectomy and HRT, anxiety, and depression who presented with worsening shortness of breath for 2 to 3 days.  Consultants:  Pulmonary  Goals of Care:  Code Status: Full Code   DVT prophylaxis: Lovenox  Interim Hx: Experienced slight worsening of her respiratory status yesterday afternoon, requiring increased oxygen support.  Afebrile.  Vital signs otherwise stable.  Presently requiring 5 L nasal cannula support.  Feels that she has stabilized on today, with O2 requirement back down to 3.5-4 L.  Assessment & Plan:  Acute exacerbation of severe COPD with acute on chronic hypoxic respiratory failure As evidenced by tachypnea and requirement for BiPAP - has completed a 5-day course of doxycycline - continue Brovana and Incruse and change to Stiolto at discharge - CTa negative for PE - still requiring an elevated level of oxygen support and therefore not yet ready for discharge home - attempting to arrange for home BiPAP to be used until patient can be qualified for home NIMV - home oxygen support requirement at baseline is reportedly 2-3 L  Tracheobronchomalacia Noted on CT during this admission - will need outpatient Pulmonary follow-up - TR[facility] is attempting to arrange home NIMV due to the fact that this patient has severe chronic respiratory failure due to severe COPD and tracheobronchomalacia.  She requires frequent durations of respiratory support and deteriorates quickly in the absence of noninvasive mechanical ventilation.  BiPAP alone has been considered but ruled out as insufficient.  Interruption or failure to provide NIMV would quickly lead to exacerbation of the patient's condition and result in  hospitalization and probable harm to the patient.  Continued use of NIMV would allow [place] to avoid this.  Without use of nightly him IV there is significant risk of decline in the patient's health status specifically caused by poor ventilation and increasing CO2 retention which will ultimately deleteriously affect her health and likely lead to readmission.  Anxiety/depression Continue olanzapine and Ativan  HTN Blood pressure presently well controlled  History of breast cancer status post-lumpectomy Continue anastrozole  Family Communication: No family present at time of exam Disposition: From home - eventual discharge home when able to be weaned down further on oxygen support and home NIMV arranged   Objective: Blood pressure 111/65, pulse 94, temperature 97.6 F (36.4 C), temperature source Oral, resp. rate 18, height '5\' 2"'$  (1.575 m), weight 65 kg, SpO2 99 %.  Intake/Output Summary (Last 24 hours) at 04/18/2022 0942 Last data filed at 04/18/2022 0428 Gross per 24 hour  Intake --  Output 1000 ml  Net -1000 ml    Filed Weights   04/09/22 0734 04/10/22 1242  Weight: 65.8 kg 65 kg    Examination: General: Much more comfortable at time of exam today Lungs: Very minimal air movement throughout at baseline but slightly improved today with no wheezing Cardiovascular: Regular rate and rhythm without murmur -distant heart sounds Abdomen: Nontender, nondistended, soft, bowel sounds positive, Extremities: No significant edema bilateral lower extremities  CBC: Recent Labs  Lab 04/13/22 0115 04/16/22 0153  WBC 14.1* 15.0*  HGB 14.8 14.6  HCT 46.2* 45.6  MCV 96.0 95.8  PLT 148* 235    Basic Metabolic Panel: Recent Labs  Lab 04/12/22 0108 04/13/22 0641 04/16/22 0153  NA 136 135  137  K 5.3* 4.5 3.9  CL 95* 93* 96*  CO2 28 26 34*  GLUCOSE 117* 129* 113*  BUN 25* 32* 19  CREATININE 0.79 0.92 0.61  CALCIUM 9.5 9.5 9.4    GFR: Estimated Creatinine Clearance: 58 mL/min  (by C-G formula based on SCr of 0.61 mg/dL).   Scheduled Meds:  amLODipine  2.5 mg Oral Daily   anastrozole  1 mg Oral Daily   arformoterol  15 mcg Nebulization BID   And   umeclidinium bromide  1 puff Inhalation Daily   enoxaparin (LOVENOX) injection  40 mg Subcutaneous Q24H   guaiFENesin  1,200 mg Oral BID   ipratropium-albuterol  3 mL Nebulization BID   lidocaine  1 patch Transdermal Daily   OLANZapine  7.5 mg Oral Daily   pantoprazole  40 mg Oral BID   polyethylene glycol  17 g Oral BID   predniSONE  40 mg Oral Q breakfast   senna-docusate  1 tablet Oral BID      LOS: 9 days   Cherene Altes, MD [facility] Office  [phone] Pager - Text Page per Shea Evans  If 7PM-7AM, please contact night-coverage per Amion 04/18/2022, 9:42 AM

## 2022-04-19 DIAGNOSIS — J441 Chronic obstructive pulmonary disease with (acute) exacerbation: Secondary | ICD-10-CM | POA: Diagnosis not present

## 2022-04-19 NOTE — Progress Notes (Signed)
Abigail Castillo  PNT:[id number] DOB: [birth year] DOA: 04/09/2022 PCP: [name], NP    Brief Narrative:  71 year old with a history of COPD, chronic hypoxic respiratory failure on 2-3 L nasal cannula home oxygen support, moderate pulmonary hypertension, breast cancer status postlumpectomy and HRT, anxiety, and depression who presented with worsening shortness of breath for 2 to 3 days.  Consultants:  Pulmonary  Goals of Care:  Code Status: Full Code   DVT prophylaxis: Lovenox  Interim Hx: Afebrile.  Vital signs stable.  Has now been weaned to her baseline 2.5-3 L nasal cannula oxygen support.  Able to ambulate in her room with physical therapy on 4 L with sats at 91% or greater.  The patient feels that she is improving but still not back to her baseline and is quite worried about her exertional tolerance at present.  Assessment & Plan:  Acute exacerbation of severe COPD with acute on chronic hypoxic respiratory failure As evidenced by tachypnea and requirement for BiPAP - has completed a 5-day course of doxycycline - continue Brovana and Incruse and change to Stiolto at discharge - CTa negative for PE - still requiring an elevated level of oxygen support and therefore not yet ready for discharge home - attempting to arrange for home BiPAP to be used until patient can be qualified for home NIMV - home oxygen support requirement at baseline is reportedly 2-3 L  Tracheobronchomalacia Noted on CT during this admission - will need outpatient Pulmonary follow-up - [facility] is attempting to arrange home NIMV due to the fact that this patient has severe chronic respiratory failure due to severe COPD and tracheobronchomalacia.  She requires frequent durations of respiratory support and deteriorates quickly in the absence of noninvasive mechanical ventilation.  BiPAP alone has been considered but ruled out as insufficient.  Interruption or failure to provide NIMV would quickly lead to exacerbation  of the patient's condition and result in hospitalization and probable harm to the patient.  Continued use of NIMV would allow [place] to avoid this.  Without use of nightly him IV there is significant risk of decline in the patient's health status specifically caused by poor ventilation and increasing CO2 retention which will ultimately deleteriously affect her health and likely lead to readmission.  Anxiety/depression Continue olanzapine and Ativan - well compensated   HTN Blood pressure presently well controlled  History of breast cancer status post-lumpectomy Continue anastrozole  Family Communication: No family present at time of exam Disposition: From home - eventual discharge home when able to be weaned down further on oxygen support and home NIMV arranged (possibly 11/26 or 11/27)  Objective: Blood pressure 129/64, pulse 99, temperature 98.2 F (36.8 C), temperature source Oral, resp. rate 19, height '5\' 2"'$  (1.575 m), weight 65 kg, SpO2 98 %.  Intake/Output Summary (Last 24 hours) at 04/19/2022 0922 Last data filed at 04/19/2022 0867 Gross per 24 hour  Intake 300 ml  Output 1750 ml  Net -1450 ml    Filed Weights   04/09/22 0734 04/10/22 1242  Weight: 65.8 kg 65 kg    Examination: General: In no acute distress Lungs: Poor air movement throughout as per baseline with no focal crackles or wheezing Cardiovascular: Regular rate and rhythm without murmur -distant heart sounds Abdomen: Nontender, nondistended, soft, bowel sounds positive, Extremities: No significant edema bilateral lower extremities  CBC: Recent Labs  Lab 04/13/22 0115 04/16/22 0153  WBC 14.1* 15.0*  HGB 14.8 14.6  HCT 46.2* 45.6  MCV 96.0 95.8  PLT  148* 003    Basic Metabolic Panel: Recent Labs  Lab 04/13/22 0641 04/16/22 0153  NA 135 137  K 4.5 3.9  CL 93* 96*  CO2 26 34*  GLUCOSE 129* 113*  BUN 32* 19  CREATININE 0.92 0.61  CALCIUM 9.5 9.4    GFR: Estimated Creatinine Clearance: 58  mL/min (by C-G formula based on SCr of 0.61 mg/dL).   Scheduled Meds:  amLODipine  2.5 mg Oral Daily   anastrozole  1 mg Oral Daily   arformoterol  15 mcg Nebulization BID   And   umeclidinium bromide  1 puff Inhalation Daily   enoxaparin (LOVENOX) injection  40 mg Subcutaneous Q24H   guaiFENesin  1,200 mg Oral BID   ipratropium-albuterol  3 mL Nebulization BID   lidocaine  1 patch Transdermal Daily   OLANZapine  7.5 mg Oral Daily   pantoprazole  40 mg Oral BID   polyethylene glycol  17 g Oral BID   predniSONE  40 mg Oral Q breakfast   senna-docusate  1 tablet Oral BID      LOS: 10 days   Cherene Altes, MD [facility] Office  [phone] Pager - Text Page per Shea Evans  If 7PM-7AM, please contact night-coverage per Amion 04/19/2022, 9:22 AM

## 2022-04-20 DIAGNOSIS — J441 Chronic obstructive pulmonary disease with (acute) exacerbation: Secondary | ICD-10-CM | POA: Diagnosis not present

## 2022-04-20 LAB — CBC
HCT: 42 % (ref 36.0–46.0)
Hemoglobin: 13.8 g/dL (ref 12.0–15.0)
MCH: 31.7 pg (ref 26.0–34.0)
MCHC: 32.9 g/dL (ref 30.0–36.0)
MCV: 96.6 fL (ref 80.0–100.0)
Platelets: 187 10*3/uL (ref 150–400)
RBC: 4.35 MIL/uL (ref 3.87–5.11)
RDW: 13 % (ref 11.5–15.5)
WBC: 16.9 10*3/uL — ABNORMAL HIGH (ref 4.0–10.5)
nRBC: 0 % (ref 0.0–0.2)

## 2022-04-20 LAB — BASIC METABOLIC PANEL
Anion gap: 9 (ref 5–15)
BUN: 12 mg/dL (ref 8–23)
CO2: 30 mmol/L (ref 22–32)
Calcium: 9.1 mg/dL (ref 8.9–10.3)
Chloride: 97 mmol/L — ABNORMAL LOW (ref 98–111)
Creatinine, Ser: 0.67 mg/dL (ref 0.44–1.00)
GFR, Estimated: >60 mL/min (ref >60)
Glucose, Bld: 131 mg/dL — ABNORMAL HIGH (ref 70–99)
Potassium: 4.7 mmol/L (ref 3.5–5.1)
Sodium: 136 mmol/L (ref 135–145)

## 2022-04-20 LAB — MAGNESIUM: Magnesium: 2.2 mg/dL (ref 1.7–2.4)

## 2022-04-20 MED ORDER — ONDANSETRON HCL 4 MG/2ML IJ SOLN
4.0000 mg | Freq: Four times a day (QID) | INTRAMUSCULAR | Status: DC | PRN
  Administered 2022-04-20 – 2022-04-21 (×3): 4 mg via INTRAVENOUS
  Filled 2022-04-20 (×3): qty 2

## 2022-04-20 NOTE — Progress Notes (Signed)
Mobility Specialist Progress Note    04/20/22 1545  Mobility  Activity Transferred to/from [facility];Ambulated with assistance in room  Level of Assistance Contact guard assist, steadying assist  Assistive Device Front wheel walker  Distance Ambulated (ft) 20 ft  Activity Response Tolerated well  Mobility Referral Yes  $Mobility charge 1 Mobility   Pre-Mobility: 93% SpO2 Post-Mobility: 92% SpO2  Pt received in bed and agreeable. C/o nausea. Had BM on BSC. Completed standing for >2 minutes for pericare. Left in chair with call bell in reach.   Hildred Alamin Mobility Specialist  Please Psychologist, sport and exercise or Rehab Office at [phone]

## 2022-04-20 NOTE — Progress Notes (Signed)
PROGRESS NOTE Abigail Castillo  JME:[id number] DOB: 1950-06-03 DOA: 04/09/2022 PCP: [name], NP   Brief Narrative/Hospital Course: 71 year old with a history of COPD, chronic hypoxic respiratory failure on 2-3 L nasal cannula home oxygen support, moderate pulmonary hypertension, breast cancer status postlumpectomy and HRT, anxiety, and depression who presented with worsening shortness of breath for 2 to 3 days.  Patient was seen in the ED,   Subjective: Seen and examined this morning.  Getting nebulizer treatment overall feeling better but reports he does not feel ready for home today Reports he ambulated yesterday and had shortness of breath Overnight afebrile remains on 2 to 2.5 L nasal cannula home setting Labs reviewed this morning has persistent leukocytosis stable renal function  Assessment and Plan: Principal Problem:   COPD (chronic obstructive pulmonary disease) (HCC) Active Problems:   Depression   GERD (gastroesophageal reflux disease)   Acute on chronic respiratory failure with hypoxia (HCC)   Ductal carcinoma in situ (DCIS) of left breast   Hyponatremia  Acute exacerbation of severe COPD Acute chronic hypoxic respiratory failure: Initially needed BiPAP, CTA negative for PE.  Completed 5 days of doxycycline.  Overall respiration is stable, continue current bronchodilators Brovana Incruse, prednisone 40 mg.  Has leukocytosis likely due to steroid attempting to arrange for home BiPAP to be used until patient can be qualified for home NIMV, currently on home oxygen setting 2 to 3 L.  Still dyspneic with activity monitor overnight 1 more day as patient does not feel ready for discharge today   Tracheobronchomalacia:Noted on CT during this admission - will need outpatient Pulmonary follow-up - [facility] is attempting to arrange home NIMV due to the fact that this patient has severe chronic respiratory failure due to severe COPD and tracheobronchomalacia.  She requires frequent  durations of respiratory support and deteriorates quickly in the absence of noninvasive mechanical ventilation.  BiPAP alone has been considered but ruled out as insufficient.  Interruption or failure to provide NIMV would quickly lead to exacerbation of the patient's condition and result in hospitalization and probable harm to the patient.  Continued use of NIMV would allow [place] to avoid this.  Without use of nightly him IV there is significant risk of decline in the patient's health status specifically caused by poor ventilation and increasing CO2 retention which will ultimately deleteriously affect her health and likely lead to readmission.   Anxiety/depression mood stable continue olanzapine and Ativan. Hypertension BP controlled, on amlodipine,  History of breast cancer status post-lumpectomy:Continue anastrozole  DVT prophylaxis: enoxaparin (LOVENOX) injection 40 mg Start: 04/09/22 1215 Code Status:   Code Status: Full Code Family Communication: plan of care discussed with patient at bedside. Patient status is: Inpatient because of respiratory failure and dyspnea Level of care: Med-Surg   Dispo: The patient is from: Home            Anticipated disposition: home tomorrow Objective: Vitals last 24 hrs: Vitals:   04/19/22 2104 04/19/22 2353 04/20/22 0446 04/20/22 0800  BP: 121/77 (!) 128/95 102/68 (!) 148/79  Pulse: (!) 104 99 81 97  Resp: '18 19 19 18  '$ Temp: 98.5 F (36.9 C) 99 F (37.2 C) 97.6 F (36.4 C) 97.8 F (36.6 C)  TempSrc: Oral Oral Oral Oral  SpO2: 97% 95% 100% 96%  Weight:      Height:       Weight change:   Physical Examination: General exam: alert awake, older than stated age HEENT:Oral mucosa moist, Ear/Nose WNL grossly Respiratory system: bilaterally  diminished breath sounds,no use of accessory muscle Cardiovascular system: S1 & S2 +, No JVD. Gastrointestinal system: Abdomen soft,NT,ND, BS+ Nervous System:Alert, awake, moving extremities. Extremities:LE edema  neg,distal peripheral pulses palpable.  Skin:No rashes,no icterus. VEH:MCNOBS muscle bulk,tone, power  Medications reviewed:  Scheduled Meds:  amLODipine  2.5 mg Oral Daily   anastrozole  1 mg Oral Daily   arformoterol  15 mcg Nebulization BID   And   umeclidinium bromide  1 puff Inhalation Daily   enoxaparin (LOVENOX) injection  40 mg Subcutaneous Q24H   guaiFENesin  1,200 mg Oral BID   ipratropium-albuterol  3 mL Nebulization BID   lidocaine  1 patch Transdermal Daily   OLANZapine  7.5 mg Oral Daily   pantoprazole  40 mg Oral BID   polyethylene glycol  17 g Oral BID   predniSONE  40 mg Oral Q breakfast   senna-docusate  1 tablet Oral BID  Continuous Infusions:   Diet Order             Diet regular Room service appropriate? Yes with Assist; Fluid consistency: Thin  Diet effective now                   Intake/Output Summary (Last 24 hours) at 04/20/2022 1135 Last data filed at 04/19/2022 2236 Gross per 24 hour  Intake --  Output 700 ml  Net -700 ml   Net IO Since Admission: -6,406 mL [04/20/22 1135]  Wt Readings from Last 3 Encounters:  04/10/22 65 kg  02/21/22 65.8 kg  12/04/21 65.7 kg     Unresulted Labs (From admission, onward)    None     Data Reviewed: I have personally reviewed following labs and imaging studies CBC: Recent Labs  Lab 04/16/22 0153 04/20/22 0127  WBC 15.0* 16.9*  HGB 14.6 13.8  HCT 45.6 42.0  MCV 95.8 96.6  PLT 239 962  Basic Metabolic Panel: Recent Labs  Lab 04/16/22 0153 04/20/22 0127  NA 137 136  K 3.9 4.7  CL 96* 97*  CO2 34* 30  GLUCOSE 113* 131*  BUN 19 12  CREATININE 0.61 0.67  CALCIUM 9.4 9.1  MG  --  2.2  CBG: Recent Labs  Lab 04/17/22 1645 04/17/22 2110 04/18/22 0634 04/18/22 1155 04/18/22 1615  GLUCAP 183* 164* 111* 143* 146*  No results found for this or any previous visit (from the past 240 hour(s)).  Antimicrobials: Anti-infectives (From admission, onward)    Start     Dose/Rate Route  Frequency Ordered Stop   04/09/22 1215  doxycycline (VIBRA-TABS) tablet 100 mg        100 mg Oral Every 12 hours 04/09/22 1201 04/13/22 2121     Culture/Microbiology    Component Value Date/Time   SDES BLOOD LEFT ANTECUBITAL 07/05/2019 1925   SPECREQUEST  07/05/2019 1925    BOTTLES DRAWN AEROBIC AND ANAEROBIC Blood Culture adequate volume   CULT  07/05/2019 1925    NO GROWTH 5 DAYS Performed at [facility], [place] [address] [place], [place] [address]    REPTSTATUS 07/10/2019 FINAL 07/05/2019 1925  Radiology Studies: No results found.  LOS: 11 days  Antonieta Pert, MD [facility]  04/20/2022, 11:35 AM

## 2022-04-21 ENCOUNTER — Encounter (HOSPITAL_COMMUNITY): Payer: Self-pay | Admitting: Internal Medicine

## 2022-04-21 ENCOUNTER — Inpatient Hospital Stay (HOSPITAL_COMMUNITY): Payer: Medicare Other

## 2022-04-21 DIAGNOSIS — J441 Chronic obstructive pulmonary disease with (acute) exacerbation: Secondary | ICD-10-CM | POA: Diagnosis not present

## 2022-04-21 LAB — GLUCOSE, CAPILLARY
Glucose-Capillary: 118 mg/dL — ABNORMAL HIGH (ref 70–99)
Glucose-Capillary: 125 mg/dL — ABNORMAL HIGH (ref 70–99)

## 2022-04-21 LAB — PULMONARY FUNCTION TEST
FEF 25-75 Pre: 0.15 L/sec
FEF2575-%Pred-Pre: 8 %
FEV1-%Pred-Pre: 13 %
FEV1-Pre: 0.28 L
FEV1FVC-%Pred-Pre: 40 %
FEV6-%Pred-Pre: 34 %
FEV6-Pre: 0.92 L
FEV6FVC-%Pred-Pre: 103 %
FVC-%Pred-Pre: 33 %
FVC-Pre: 0.93 L
Pre FEV1/FVC ratio: 31 %
Pre FEV6/FVC Ratio: 99 %

## 2022-04-21 NOTE — Discharge Summary (Signed)
Physician Discharge Summary  Abigail Castillo KVQ:[id number] DOB: 1950-09-25 DOA: 04/09/2022  PCP: [name], NP  Admit date: 04/09/2022 Discharge date: 04/26/2022 Recommendations for Outpatient Follow-up:  Follow up with PCP in 1 weeks-call for appointment Please obtain BMP/CBC in one week Continue with bedtime BiPAP  Discharge Dispo: home w/ [facility] Discharge Condition: Stable Code Status:   Code Status: Full Code Diet recommendation:  Diet Order             Diet regular Room service appropriate? Yes with Assist; Fluid consistency: Thin  Diet effective now                 Brief/Interim Summary:71 year old with a history of COPD, chronic hypoxic respiratory failure on 2-3 L nasal cannula home oxygen support, moderate pulmonary hypertension, breast cancer status postlumpectomy and HRT, anxiety, and depression who presented with worsening shortness of breath for 2 to 3 days.  Patient was seen in the ED, admitted for acute exacerbation of severe COPD acute on chronic hypoxic respiratory failure needing BiPAP CT scan negative for PE found to have tracheobronchomalacia, seen by pulmonary. Managed with a steroid, initially needing BiPAP subsequently down to oxygen at home setting, planning for BiPAP on discharge at home At this time is doing well with PT and ambulation Dose of shortness of breath.  Chest x-ray no acute finding.  Labs with leukocytosis but slightly better which is from steroid. At this time she is medically stable for discharge to SNF.  Discharge Diagnoses:  Principal Problem:   COPD (chronic obstructive pulmonary disease) (HCC) Active Problems:   Depression   GERD (gastroesophageal reflux disease)   Acute on chronic respiratory failure with hypoxia (HCC)   Ductal carcinoma in situ (DCIS) of left breast   Hyponatremia   Bronchomalacia   Localized edema   COPD exacerbation (HCC)  Acute exacerbation of severe COPD Acute chronic hypoxic respiratory  failure: Initially needed BiPAP, CTA negative for PE.Completed 5 days of doxycycline.  Overall respiration is improving and is stable continue with regular nasal cannula, she is being set up for BiPAP at home.  Ambulating well with PT OT and planning for home health PT OT upon discharge.Continue  Brovana Incruse, prednisone 40 mg>changed to 20 mg  and taper off in 3 days.Patient was seen by pulmonary - advised she will need outpatient follow-up for tracheomalacia stent versus CPAP/BiPAP, TOC/case worker trying to get BiPAP for patient. She needs Stiolto rx on dc.   Tracheobronchomalacia:Noted on CT during this admission - will need outpatient Pulmonary follow-up - TRH is attempting to arrange home NIMV due to the fact that this patient has severe chronic respiratory failure due to severe COPD and tracheobronchomalacia.She requires frequent durations of respiratory support and deteriorates quickly in the absence of noninvasive mechanical ventilation.  BiPAP alone has been considered but ruled out as insufficient.  Interruption or failure to provide NIMV would quickly lead to exacerbation of the patient's condition and result in hospitalization and probable harm to the patient.  Continued use of NIMV would allow [place] to avoid this.  Without use of NIMV there is significant risk of decline in the patient's health status specifically caused by poor ventilation and increasing CO2 retention which will ultimately deleteriously affect her health and likely lead to readmission. Discussed w/ PCCM Pete-advised to discharge on BIPAP QHS and PRN for shortness of breath 10/5, Bleed in 3-4 L of oxygen/min,he will arrange f/u at pulm office.  Anxiety/depression mood stable.  Continue her olanzapine and Ativan. Hypertension  BP is controlled on amlodipine 2.'5MG'$   History of breast cancer status post-lumpectomy:Continue anastrozole Deconditioning debility  going to SNF  Consults: Pulmonary Subjective: Alert Abigail Castillo resting  comfortably on 3 l nasal cannula She ambulated with physical therapy saturation more than 92% on 3 nasal cannula  Discharge Exam: Vitals:   04/26/22 0819 04/26/22 1123  BP: (!) 130/94 128/81  Pulse: (!) 108 (!) 106  Resp: 16 18  Temp: 97.7 F (36.5 C) 98.2 F (36.8 C)  SpO2: 93% 100%   General: Pt is alert, awake, not in acute distress Cardiovascular: RRR, S1/S2 +, no rubs, no gallops Respiratory: CTA bilaterally, no wheezing, no rhonchi Abdominal: Soft, NT, ND, bowel sounds + Extremities: no edema, no cyanosis  Discharge Instructions  Discharge Instructions     Discharge instructions   Complete by: As directed    Follow-up with pulmonary office for further care of your tracheobronchomalacia/respiratory failure  Please call call MD or return to ER for similar or worsening recurring problem that brought you to hospital or if any fever,nausea/vomiting,abdominal pain, uncontrolled pain, chest pain,  shortness of breath or any other alarming symptoms.  Please follow-up your doctor as instructed in a week time and call the office for appointment.  Please avoid alcohol, smoking, or any other illicit substance and maintain healthy habits including taking your regular medications as prescribed.  You were cared for by a hospitalist during your hospital stay. If you have any questions about your discharge medications or the care you received while you were in the hospital after you are discharged, you can call the unit and ask to speak with the hospitalist on call if the hospitalist that took care of you is not available.  Once you are discharged, your primary care physician will handle any further medical issues. Please note that NO REFILLS for any discharge medications will be authorized once you are discharged, as it is imperative that you return to your primary care physician (or establish a relationship with a primary care physician if you do not have one) for your aftercare needs so  that they can reassess your need for medications and monitor your lab values   Increase activity slowly   Complete by: As directed       Allergies as of 04/26/2022       Reactions   Sulfonamide Derivatives Hives        Medication List     TAKE these medications    AeroChamber MV inhaler Use as instructed   albuterol 108 (90 Base) MCG/ACT inhaler Commonly known as: VENTOLIN HFA INHALE TWO PUFFS BY MOUTH INTO THE LUNGS EVERY 6 HOURS AS NEEDED FOR WHEEZING OR SHORTNESS OF BREATH What changed: See the new instructions.   amLODipine 2.5 MG tablet Commonly known as: NORVASC Take 1 tablet (2.5 mg total) by mouth daily.   anastrozole 1 MG tablet Commonly known as: ARIMIDEX TAKE ONE TABLET BY MOUTH DAILY   Calcium Carbonate-Vitamin D 600-400 MG-UNIT tablet Take 1 tablet by mouth 2 (two) times daily.   cholecalciferol 1000 units tablet Commonly known as: VITAMIN D Take 3,000 Units by mouth daily.   cyanocobalamin 1000 MCG tablet Commonly known as: VITAMIN B12 Take 1 tablet (1,000 mcg total) by mouth daily.   dexlansoprazole 60 MG capsule Commonly known as: DEXILANT Take 1 capsule (60 mg total) by mouth daily.   guaiFENesin 600 MG 12 hr tablet Commonly known as: MUCINEX Take 2 tablets (1,200 mg total) by mouth 2 (two) times daily for 7  days.   ibuprofen 800 MG tablet Commonly known as: ADVIL Take 1 tablet (800 mg total) by mouth every 8 (eight) hours as needed. What changed: reasons to take this   LORazepam 1 MG tablet Commonly known as: ATIVAN Take 1 mg by mouth 2 (two) times daily.   OLANZapine 5 MG tablet Commonly known as: ZYPREXA Take 7.5 mg by mouth daily.   OXYGEN Inhale 3 L into the lungs.   predniSONE 20 MG tablet Commonly known as: DELTASONE Take 1 tablet (20 mg total) by mouth daily with breakfast for 3 days.   Stiolto Respimat 2.5-2.5 MCG/ACT Aers Generic drug: Tiotropium Bromide-Olodaterol INHALE TWO PUFFS INTO THE LUNGS DAILY What  changed:  how much to take how to take this when to take this   vitamin E 180 MG (400 UNITS) capsule Take 400 Units by mouth daily.               Durable Medical Equipment  (From admission, onward)           Start     Ordered   04/25/22 1612  For home use only DME Bipap  Once       Comments: BIPap for tracheomalacia. Cont 10/5, bleed 3-4l/min of oxygen  Question Answer Comment  Length of Need Lifetime   Bleed in oxygen (LPM) 3-4   Inspiratory pressure 10   Expiratory pressure 5      04/25/22 1612   04/16/22 1253  For home use only DME Tub bench  Once        04/16/22 1253   04/15/22 1331  For home use only DME Nebulizer machine  Once       Question Answer Comment  Patient needs a nebulizer to treat with the following condition COPD (chronic obstructive pulmonary disease) ([place])   Length of Need 6 Months      04/15/22 1331            Contact information for follow-up providers     [facility] Follow up.   Specialty: [place] Why: HHPT arranged- they will contact you to schedule visits ( anticipate start of care tues/wed following discharge) Contact information: [address] [place] [address] [phone]         [name], NP Follow up in 1 week(s).   Specialty: Internal Medicine Contact information: [place] [address] [phone]         Juanito Doom, MD Follow up in 1 week(s).   Specialty: Pulmonary Disease Contact information: [address] [place] [address] [phone]              Contact information for after-discharge care     Destination     [facility] Preferred SNF .   Service: Skilled Nursing Contact information: [address] [address] [phone]                    Allergies  Allergen Reactions   Sulfonamide Derivatives Hives    The results of  significant diagnostics from this hospitalization (including imaging, microbiology, ancillary and laboratory) are listed below for reference.    Microbiology: No results found for this or any previous visit (from the past 240 hour(s)).  Procedures/Studies: DG Chest Port 1 View  Result Date: 04/25/2022 CLINICAL DATA:  History of COPD.  Shortness of breath EXAM: PORTABLE CHEST 1 VIEW COMPARISON:  04/13/2022 FINDINGS: Redemonstrated findings of severe bolus emphysema. Unchanged cardiac and mediastinal contours. No pneumothorax. Persistent bibasilar reticular opacities, unchanged from prior exam. No new focal airspace opacities visualized. Visualized upper abdomen is unremarkable. No displaced rib fracture. IMPRESSION: Redemonstrated findings of severe bolus emphysema. No new focal airspace opacities visualized. Electronically Signed   By: Marin Roberts M.D.   On: 04/25/2022 10:56   CT Angio Chest Pulmonary Embolism (PE) W or WO Contrast  Result Date: 04/14/2022 CLINICAL DATA:  Evaluate for acute pulmonary embolus. EXAM: CT ANGIOGRAPHY CHEST WITH CONTRAST TECHNIQUE: Multidetector CT imaging of the chest was performed using the standard protocol during bolus administration of intravenous contrast. Multiplanar CT image reconstructions and MIPs were obtained to evaluate the vascular anatomy. RADIATION DOSE REDUCTION: This exam was performed according to the departmental dose-optimization program which includes automated exposure control, adjustment of the mA and/or kV according to patient size and/or use of iterative reconstruction technique. CONTRAST:  64m OMNIPAQUE IOHEXOL 350 MG/ML SOLN COMPARISON:  04/17/2010 FINDINGS: Cardiovascular: Satisfactory opacification of the pulmonary arteries to the segmental level. No evidence of pulmonary embolism. Aortic atherosclerosis. Coronary artery calcifications. Normal heart size. No pericardial effusion. Mediastinum/Nodes: Thyroid gland, and esophagus appear  unremarkable. There is decreased AP diameter of the trachea and bilateral mainstem bronchi concerning for chronic tracheobronchomalacia. No enlarged axillary, mediastinal, or hilar lymph nodes. Lungs/Pleura: Severe changes of emphysema. No pleural effusion. No airspace consolidation. Subpleural scarring noted within the posterolateral right middle lobe. Upper Abdomen: No acute findings. Small hiatal hernia. Status post cholecystectomy. Musculoskeletal: No acute or suspicious osseous findings. Mild chronic endplate deformities noted throughout the thoracic spine. Review of the MIP images confirms the above findings. IMPRESSION: 1. No evidence for acute pulmonary embolism. 2. Decreased AP diameter of the trachea and bilateral mainstem bronchi concerning for chronic tracheobronchomalacia. 3. Diffuse bronchial wall thickening with severe emphysema, as above; imaging findings suggestive of underlying COPD. Emphysema (ICD10-J43.9). 4. Coronary artery calcifications. 5. Small hiatal hernia. 6.  Aortic Atherosclerosis (ICD10-I70.0). Electronically Signed   By: TKerby MoorsM.D.   On: 04/14/2022 10:24   DG CHEST PORT 1 VIEW  Result Date: 04/13/2022 CLINICAL DATA:  Hypoxemia, dry throat EXAM: PORTABLE CHEST 1 VIEW COMPARISON:  Prior chest x-ray 04/09/2022 FINDINGS: Stable cardiac and mediastinal contours. Severe bullous emphysematous changes in both mid and upper lungs again noted. Mild chronic interstitial prominence in the lung bases. No pneumothorax or pleural effusion. No significant interval change. Surgical clips project over the left chest. IMPRESSION: Stable appearance of the chest with the out evidence of acute cardiopulmonary process. Electronically Signed   By: HJacqulynn CadetM.D.   On: 04/13/2022 07:35   DG Chest Port 1 View  Result Date: 04/09/2022 CLINICAL DATA:  Dyspnea, history breast cancer, COPD, emphysema, GERD, hepatic steatosis EXAM: PORTABLE CHEST 1 VIEW COMPARISON:  Portable exam 0750  hours compared to 07/05/2019 FINDINGS: Normal heart size and mediastinal contours. Prominent central pulmonary arteries question pulmonary arterial hypertension. Severe emphysematous changes, greater on RIGHT. Bibasilar chronic interstitial lung disease changes again seen. No acute infiltrate, pleural effusion, or pneumothorax. Diffuse osseous demineralization. IMPRESSION: COPD changes with severe bibasilar chronic interstitial lung disease. Question pulmonary arterial hypertension. No acute abnormalities or interval change. Electronically Signed   By: MLavonia DanaM.D.   On: 04/09/2022 07:58    Labs: BNP (last 3 results) Recent Labs    04/09/22 0745 04/25/22 1649  BNP 22.7 441.3  Basic Metabolic Panel: Recent Labs  Lab 04/20/22 0127 04/25/22 1020  NA 136 137  K 4.7 3.9  CL 97* 96*  CO2 30 33*  GLUCOSE 131* 130*  BUN 12 13  CREATININE 0.67 0.66  CALCIUM 9.1 9.0  MG 2.2  --    Liver Function Tests: No results for input(s): "AST", "ALT", "ALKPHOS", "BILITOT", "PROT", "ALBUMIN" in the last 168 hours. No results for input(s): "LIPASE", "AMYLASE" in the last 168 hours. No results for input(s): "AMMONIA" in the last 168 hours. CBC: Recent Labs  Lab 04/20/22 0127 04/25/22 1020  WBC 16.9* 16.3*  HGB 13.8 13.3  HCT 42.0 39.7  MCV 96.6 94.7  PLT 187 210   Cardiac Enzymes: No results for input(s): "CKTOTAL", "CKMB", "CKMBINDEX", "TROPONINI" in the last 168 hours. BNP: Invalid input(s): "POCBNP" CBG: Recent Labs  Lab 04/21/22 1305 04/21/22 1705  GLUCAP 118* 125*   D-Dimer Recent Labs    04/25/22 1649  DDIMER <0.27   Hgb A1c No results for input(s): "HGBA1C" in the last 72 hours. Lipid Profile No results for input(s): "CHOL", "HDL", "LDLCALC", "TRIG", "CHOLHDL", "LDLDIRECT" in the last 72 hours. Thyroid function studies No results for input(s): "TSH", "T4TOTAL", "T3FREE", "THYROIDAB" in the last 72 hours.  Invalid input(s): "FREET3" Anemia work up No results for  input(s): "VITAMINB12", "FOLATE", "FERRITIN", "TIBC", "IRON", "RETICCTPCT" in the last 72 hours. Urinalysis    Component Value Date/Time   COLORURINE AMBER (A) 07/06/2019 0712   APPEARANCEUR HAZY (A) 07/06/2019 0712   LABSPEC 1.034 (H) 07/06/2019 0712   PHURINE 5.0 07/06/2019 0712   GLUCOSEU 50 (A) 07/06/2019 0712   GLUCOSEU NEGATIVE 11/11/2017 1027   HGBUR NEGATIVE 07/06/2019 0712   BILIRUBINUR NEGATIVE 07/06/2019 0712   KETONESUR 20 (A) 07/06/2019 0712   PROTEINUR 30 (A) 07/06/2019 0712   UROBILINOGEN 0.2 11/11/2017 1027   NITRITE NEGATIVE 07/06/2019 0712   LEUKOCYTESUR NEGATIVE 07/06/2019 0712   Sepsis Labs Recent Labs  Lab 04/20/22 0127 04/25/22 1020  WBC 16.9* 16.3*   Microbiology No results found for this or any previous visit (from the past 240 hour(s)).  Time coordinating discharge: 35 minutes  SIGNED: Antonieta Pert, MD  [facility] 04/26/2022, 11:36 AM  If 7PM-7AM, please contact night-coverage [email or web address]

## 2022-04-21 NOTE — Progress Notes (Signed)
PROGRESS NOTE Abigail Castillo  GDJ:[id number] DOB: May 19, 1951 DOA: 04/09/2022 PCP: [name], NP   Brief Narrative/Hospital Course: 71 year old with a history of COPD, chronic hypoxic respiratory failure on 2-3 L nasal cannula home oxygen support, moderate pulmonary hypertension, breast cancer status postlumpectomy and HRT, anxiety, and depression who presented with worsening shortness of breath for 2 to 3 days.  Patient was seen in the ED, admitted for acute exacerbation of severe COPD acute on chronic hypoxic respiratory failure needing BiPAP CT scan negative for PE found to have tracheobronchomalacia, seen by pulmonary.     Subjective: Seen and examined this morning.Seen on the bedside chair,able to speak well,no shortness of breath.Had not walked with PT with mobility tech>did not walk more than 14 feet due to nausea.later worked w/ PT did 46 ft, with plan for Stairs PT in am.  Assessment and Plan: Principal Problem:   COPD (chronic obstructive pulmonary disease) ([place]) Active Problems:   Depression   GERD (gastroesophageal reflux disease)   Acute on chronic respiratory failure with hypoxia (HCC)   Ductal carcinoma in situ (DCIS) of left breast   Hyponatremia  Acute exacerbation of severe COPD Acute chronic hypoxic respiratory failure: Initially needed BiPAP, CTA negative for PE.Completed 5 days of doxycycline.  Overall respiration is improving  cont PT/OT, plan for Stair PT tomorrow. Cont Brovana Incruse, prednisone 40 mg.Has leukocytosis likely due to steroid attempting to arrange for home BiPAP to be used until patient can be qualified for home NIMV, currently on home oxygen setting 2 to 3 L, needing more with ambulation.  We will ensure respiratory status stable with mobility before discharge home.   Tracheobronchomalacia:Noted on CT during this admission - will need outpatient Pulmonary follow-up - TRH is attempting to arrange home NIMV due to the fact that this patient has  severe chronic respiratory failure due to severe COPD and tracheobronchomalacia.  She requires frequent durations of respiratory support and deteriorates quickly in the absence of noninvasive mechanical ventilation.  BiPAP alone has been considered but ruled out as insufficient.  Interruption or failure to provide NIMV would quickly lead to exacerbation of the patient's condition and result in hospitalization and probable harm to the patient.  Continued use of NIMV would allow [place] to avoid this.  Without use of nightly him IV there is significant risk of decline in the patient's health status specifically caused by poor ventilation and increasing CO2 retention which will ultimately deleteriously affect her health and likely lead to readmission.   Anxiety/depression mood stable continue current olanzapine and Ativan. Hypertension BP controlled on amlodipine,  History of breast cancer status post-lumpectomy:Continue anastrozole  DVT prophylaxis: enoxaparin (LOVENOX) injection 40 mg Start: 04/09/22 1215 Code Status:   Code Status: Full Code Family Communication: plan of care discussed with patient at bedside. Patient status is: Inpatient because of respiratory failure and dyspnea Level of care: Med-Surg   Dispo: The patient is from: Home            Anticipated disposition: home tomorrow if respiratory status remains stable after PT Objective: Vitals last 24 hrs: Vitals:   04/21/22 0004 04/21/22 0317 04/21/22 0740 04/21/22 0909  BP: 120/73 133/73 111/71 111/75  Pulse: 100 (!) 108 (!) 105 62  Resp: '18 17  18  '$ Temp: 98.6 F (37 C) 98.5 F (36.9 C) 99.3 F (37.4 C) 98 F (36.7 C)  TempSrc: Oral Oral Oral Oral  SpO2: 98% 100% 95% 93%  Weight:      Height:  Weight change:   Physical Examination: General exam: AAox3, weak,older appearing HEENT:Oral mucosa moist, Ear/Nose WNL grossly, dentition normal. Respiratory system: bilaterally diminished BS, no use of accessory  muscle Cardiovascular system: S1 & S2 +, regular rate, JVD neg. Gastrointestinal system: Abdomen soft,NT,ND,BS+ Nervous System:Alert, awake, moving extremities and grossly nonfocal Extremities: LE ankle edema neg, lower extremities warm Skin:No rashes,no icterus. JSE:GBTDVV muscle bulk,tone, power   Medications reviewed:  Scheduled Meds:  amLODipine  2.5 mg Oral Daily   anastrozole  1 mg Oral Daily   arformoterol  15 mcg Nebulization BID   And   umeclidinium bromide  1 puff Inhalation Daily   enoxaparin (LOVENOX) injection  40 mg Subcutaneous Q24H   guaiFENesin  1,200 mg Oral BID   ipratropium-albuterol  3 mL Nebulization BID   lidocaine  1 patch Transdermal Daily   OLANZapine  7.5 mg Oral Daily   pantoprazole  40 mg Oral BID   polyethylene glycol  17 g Oral BID   predniSONE  40 mg Oral Q breakfast   senna-docusate  1 tablet Oral BID  Continuous Infusions:   Diet Order             Diet regular Room service appropriate? Yes with Assist; Fluid consistency: Thin  Diet effective now                  No intake or output data in the 24 hours ending 04/21/22 1303  Net IO Since Admission: -6,406 mL [04/21/22 1303]  Wt Readings from Last 3 Encounters:  04/10/22 65 kg  02/21/22 65.8 kg  12/04/21 65.7 kg     Unresulted Labs (From admission, onward)    None     Data Reviewed: I have personally reviewed following labs and imaging studies CBC: Recent Labs  Lab 04/16/22 0153 04/20/22 0127  WBC 15.0* 16.9*  HGB 14.6 13.8  HCT 45.6 42.0  MCV 95.8 96.6  PLT 239 616  Basic Metabolic Panel: Recent Labs  Lab 04/16/22 0153 04/20/22 0127  NA 137 136  K 3.9 4.7  CL 96* 97*  CO2 34* 30  GLUCOSE 113* 131*  BUN 19 12  CREATININE 0.61 0.67  CALCIUM 9.4 9.1  MG  --  2.2  CBG: Recent Labs  Lab 04/17/22 1645 04/17/22 2110 04/18/22 0634 04/18/22 1155 04/18/22 1615  GLUCAP 183* 164* 111* 143* 146*  No results found for this or any previous visit (from the past 240  hour(s)).  Antimicrobials: Anti-infectives (From admission, onward)    Start     Dose/Rate Route Frequency Ordered Stop   04/09/22 1215  doxycycline (VIBRA-TABS) tablet 100 mg        100 mg Oral Every 12 hours 04/09/22 1201 04/13/22 2121     Culture/Microbiology    Component Value Date/Time   SDES BLOOD LEFT ANTECUBITAL 07/05/2019 1925   SPECREQUEST  07/05/2019 1925    BOTTLES DRAWN AEROBIC AND ANAEROBIC Blood Culture adequate volume   CULT  07/05/2019 1925    NO GROWTH 5 DAYS Performed at [facility], [place] [address], [place], [place] [address]    REPTSTATUS 07/10/2019 FINAL 07/05/2019 1925  Radiology Studies: No results found.  LOS: 12 days  Antonieta Pert, MD [facility]  04/21/2022, 1:03 PM

## 2022-04-21 NOTE — Progress Notes (Signed)
Mobility Specialist Progress Note:   04/21/22 0903  Mobility  Activity Ambulated with assistance in room  Level of Assistance Standby assist, set-up cues, supervision of patient - no hands on  Assistive Device Front wheel walker  Distance Ambulated (ft) 14 ft  Activity Response Tolerated well  $Mobility charge 1 Mobility   Pt received in bed willing to participate in mobility. Complaints of nausea which limited distance. Left in chair with call bell in reach and all needs met.   Gareth Eagle Kimmie Berggren Mobility Specialist Please contact via [employer] or  Rehab Office at [phone]

## 2022-04-21 NOTE — Hospital Course (Addendum)
71 year old woman PMH COPD, chronic hypoxic respiratory failure on 2-3 L, moderate pulmonary hypertension presented with shortness of breath.  Seen by pulmonary, treated with bronchodilators, steroids for acute COPD exacerbation complicated by tracheobronchomalacia.  Plan for BiPAP at home.  Planning for SNF once respiratory status improved. 11/15 admit for COPD exacerbation 11/16 pulmonology consult 12/1 per PCCM advised to discharge on BIPAP QHS and PRN for shortness of breath 10/5, Bleed in 3-4 L of oxygen/min, f/u at pulm office. Discharge planned, but decompensated, PCCM re-involved. 12/2 COPD with acute exacerbation, seems recurrent after reducing steroid dose. Thankfully D-dimer does not suggest PE, low PCT and CXR suggest against pneumonia, and low BNP suggests against significant heart failure at this time.   home NIMV due to the fact that this patient has severe chronic respiratory failure due to severe COPD and tracheobronchomalacia. She requires frequent durations of respiratory support and deteriorates quickly in the absence of noninvasive mechanical ventilation. BiPAP alone has been considered but ruled out as insufficient. Interruption or failure to provide NIMV would quickly lead to exacerbation of the patient's condition and result in hospitalization and probable harm to the patient. Continued use of NIMV would allow [place] to avoid this. Without use of nightly him IV there is significant risk of decline in the patient's health status specifically caused by poor ventilation and increasing CO2 retention which will ultimately deleteriously affect her health and likely lead to readmission.

## 2022-04-21 NOTE — Progress Notes (Signed)
Physical Therapy Treatment Patient Details Name: Abigail Castillo MRN: [id number] DOB: 1951/03/06 Today's Date: 04/21/2022   History of Present Illness Pt is a 71 y.o. F who presents 04/09/2022 with COPD with acute exacerbation. Significant PMH: COPD, chronic respiratory failure with hypoxemia, COVID-19, osteoporosis, tobacco abuse.    PT Comments    Patient progressing well towards PT goals. Session focused on endurance training, strengthening and overall mobility. Continues to get dyspneic with ambulation needing seated rest break of a few minutes to recover. Sp02 remained >91% on 4L/min 02 [place] during activity. Worked on sit to stands for LE strengthening and cardiovascular function. Encouraged adding to HEP at home. Will continue to follow and progress as tolerated. Plan for stair training next session as pt reports difficulty with them at home.    Recommendations for follow up therapy are one component of a multi-disciplinary discharge planning process, led by the attending physician.  Recommendations may be updated based on patient status, additional functional criteria and insurance authorization.  Follow Up Recommendations  Home health PT     Assistance Recommended at Discharge PRN  Patient can return home with the following Assistance with cooking/housework;Assist for transportation;Help with stairs or ramp for entrance   Equipment Recommendations  Other (comment)    Recommendations for Other Services       Precautions / Restrictions Precautions Precautions: Other (comment);Fall Precaution Comments: watch O2 Restrictions Weight Bearing Restrictions: No     Mobility  Bed Mobility               General bed mobility comments: Up in chair upon PT arrival.    Transfers Overall transfer level: Needs assistance Equipment used: Rolling walker (2 wheels) Transfers: Sit to/from Stand Sit to Stand: Supervision           General transfer comment: Supervision for  safety. Stood from Theatre stage manager.    Ambulation/Gait Ambulation/Gait assistance: Supervision Gait Distance (Feet): 40 Feet (x2 bouts) Assistive device: Rolling walker (2 wheels) Gait Pattern/deviations: Step-through pattern, Decreased stride length Gait velocity: decreased Gait velocity interpretation: 1.31 - 2.62 ft/sec, indicative of limited community ambulator   General Gait Details: Slow, steady gait with use of RW for support, 2-3/4 DOE. SP02 remained >91% on 4L/min 02 [place]. Rest break needed between bouts.   Stairs             Wheelchair Mobility    Modified Rankin (Stroke Patients Only)       Balance Overall balance assessment: Needs assistance Sitting-balance support: Feet supported, No upper extremity supported Sitting balance-Leahy Scale: Good     Standing balance support: During functional activity Standing balance-Leahy Scale: Fair Standing balance comment: Able to stand statically without UE support, but prefers UE support                            Cognition Arousal/Alertness: Awake/alert Behavior During Therapy: Flat affect Overall Cognitive Status: No family/caregiver present to determine baseline cognitive functioning                                 General Comments: follows commands, increased processing time and very flat affect throughout        Exercises Other Exercises Other Exercises: Sit to stand x5 from chair for strengthening.    General Comments General comments (skin integrity, edema, etc.): SP02 remained >91% on 4L/min 02 [place] during activity.  Pertinent Vitals/Pain Pain Assessment Pain Assessment: No/denies pain    Home Living                          Prior Function            PT Goals (current goals can now be found in the care plan section) Progress towards PT goals: Progressing toward goals    Frequency    Min 3X/week      PT Plan Current plan remains appropriate     Co-evaluation              AM-PAC PT "6 Clicks" Mobility   Outcome Measure  Help needed turning from your back to your side while in a flat bed without using bedrails?: None Help needed moving from lying on your back to sitting on the side of a flat bed without using bedrails?: A Little Help needed moving to and from a bed to a chair (including a wheelchair)?: A Little Help needed standing up from a chair using your arms (e.g., wheelchair or bedside chair)?: A Little Help needed to walk in hospital room?: A Little Help needed climbing 3-5 steps with a railing? : A Little 6 Click Score: 19    End of Session Equipment Utilized During Treatment: Oxygen Activity Tolerance: Patient tolerated treatment well Patient left: in chair;with call bell/phone within reach Nurse Communication: Mobility status PT Visit Diagnosis: Difficulty in walking, not elsewhere classified (R26.2);Unsteadiness on feet (R26.81);Other abnormalities of gait and mobility (R26.89)     Time: 4431-5400 PT Time Calculation (min) (ACUTE ONLY): 20 min  Charges:  $Therapeutic Exercise: 8-22 mins                     Marisa Severin, PT, DPT Acute Rehabilitation Services Secure chat preferred Office [place] 04/21/2022, 12:24 PM

## 2022-04-22 DIAGNOSIS — J441 Chronic obstructive pulmonary disease with (acute) exacerbation: Secondary | ICD-10-CM | POA: Diagnosis not present

## 2022-04-22 MED ORDER — PREDNISONE 20 MG PO TABS
20.0000 mg | ORAL_TABLET | Freq: Every day | ORAL | Status: AC
  Administered 2022-04-23 – 2022-04-26 (×4): 20 mg via ORAL
  Filled 2022-04-22 (×4): qty 1

## 2022-04-22 NOTE — Progress Notes (Signed)
Mobility Specialist Progress Note:   04/22/22 0937  Mobility  Activity Ambulated with assistance in room  Level of Assistance Standby assist, set-up cues, supervision of patient - no hands on  Assistive Device Front wheel walker  Distance Ambulated (ft) 20 ft  Activity Response Tolerated well  $Mobility charge 1 Mobility   Pt received in bed willing to participate in mobility. No complaints of pain. Left in chair with call bell in reach and all needs met.   Gareth Eagle Rosalie Buenaventura Mobility Specialist Please contact via [employer] or  Rehab Office at [phone]

## 2022-04-22 NOTE — Progress Notes (Signed)
PROGRESS NOTE Abigail Castillo  TKP:[id number] DOB: [birth year] DOA: 04/09/2022 PCP: [name], NP   Brief Narrative/Hospital Course: 71 year old with a history of COPD, chronic hypoxic respiratory failure on 2-3 L nasal cannula home oxygen support, moderate pulmonary hypertension, breast cancer status postlumpectomy and HRT, anxiety, and depression who presented with worsening shortness of breath for 2 to 3 days.  Patient was seen in the ED, admitted for acute exacerbation of severe COPD acute on chronic hypoxic respiratory failure needing BiPAP CT scan negative for PE found to have tracheobronchomalacia, seen by pulmonary.   Subjective: Seen and examined this morning,  Overall feeling better, after ambulation this afternoon needing 4l  Mild cough Awaiting to work on stairs with PT  Assessment and Plan: Principal Problem:   COPD (chronic obstructive pulmonary disease) (HCC) Active Problems:   Depression   GERD (gastroesophageal reflux disease)   Acute on chronic respiratory failure with hypoxia (HCC)   Ductal carcinoma in situ (DCIS) of left breast   Hyponatremia  Acute exacerbation of severe COPD Acute chronic hypoxic respiratory failure: Initially needed BiPAP, CTA negative for PE.Completed 5 days of doxycycline.  Overall respiration is improving> but with ambulation and needing more oxygen, continue PT OT, planning for a stair PT today.  Continue  Brovana Incruse, prednisone 40 mg>change to 20 mg  and taper off in 4 days.Patient was seen by pulmonary - advised she will need outpatient follow-up for tracheomalacia stent versus CPAP/BiPAP, TOC/case worker trying to get BiPAP for patient. She needs Stiolto rx on dc.   Tracheobronchomalacia:Noted on CT during this admission - will need outpatient Pulmonary follow-up - [facility] is attempting to arrange home NIMV due to the fact that this patient has severe chronic respiratory failure due to severe COPD and tracheobronchomalacia.She  requires frequent durations of respiratory support and deteriorates quickly in the absence of noninvasive mechanical ventilation.  BiPAP alone has been considered but ruled out as insufficient.  Interruption or failure to provide NIMV would quickly lead to exacerbation of the patient's condition and result in hospitalization and probable harm to the patient.  Continued use of NIMV would allow [place] to avoid this.  Without use of NIMV there is significant risk of decline in the patient's health status specifically caused by poor ventilation and increasing CO2 retention which will ultimately deleteriously affect her health and likely lead to readmission.   Anxiety/depression mood stable.  Continue her olanzapine and Ativan. Hypertension BP is controlled on amlodipine 2.'5MG'$   History of breast cancer status post-lumpectomy:Continue anastrozole  DVT prophylaxis: enoxaparin (LOVENOX) injection 40 mg Start: 04/09/22 1215 Code Status:   Code Status: Full Code Family Communication: plan of care discussed with patient at bedside. Patient status is: Inpatient because of respiratory failure and dyspnea Level of care: Med-Surg   Dispo: The patient is from: Home            Anticipated disposition: home tomorrow if respiratory status remains stable after PT Objective: Vitals last 24 hrs: Vitals:   04/21/22 2357 04/22/22 0441 04/22/22 0826 04/22/22 1255  BP: 123/75 (!) 143/89  111/61  Pulse: 100 93 81 (!) 101  Resp: '18 18 20 20  '$ Temp: (!) 97.5 F (36.4 C) 98.2 F (36.8 C)  98.4 F (36.9 C)  TempSrc: Oral Oral  Oral  SpO2: 91% 96% 97% 90%  Weight:      Height:       Weight change:   Physical Examination: General exam: AAO, weak,older appearing HEENT:Oral mucosa moist, Ear/Nose WNL grossly, dentition  normal. Respiratory system: bilaterally diminished BS, CRACKLES FROM UPPER AIRWAYS Cardiovascular system: S1 & S2 +, regular rate, JVD NEG. Gastrointestinal system: Abdomen soft, NT,ND,BS+ Nervous  System:Alert, awake, moving extremities and grossly nonfocal Extremities: LE ankle edema NEG, lower extremities warm Skin: No rashes,no icterus. MSK: Normal muscle bulk,tone, power    Medications reviewed:  Scheduled Meds:  amLODipine  2.5 mg Oral Daily   anastrozole  1 mg Oral Daily   arformoterol  15 mcg Nebulization BID   And   umeclidinium bromide  1 puff Inhalation Daily   enoxaparin (LOVENOX) injection  40 mg Subcutaneous Q24H   guaiFENesin  1,200 mg Oral BID   ipratropium-albuterol  3 mL Nebulization BID   lidocaine  1 patch Transdermal Daily   OLANZapine  7.5 mg Oral Daily   pantoprazole  40 mg Oral BID   polyethylene glycol  17 g Oral BID   predniSONE  40 mg Oral Q breakfast   senna-docusate  1 tablet Oral BID  Continuous Infusions:   Diet Order             Diet regular Room service appropriate? Yes with Assist; Fluid consistency: Thin  Diet effective now                   Intake/Output Summary (Last 24 hours) at 04/22/2022 1509 Last data filed at 04/22/2022 1256 Gross per 24 hour  Intake 240 ml  Output 1000 ml  Net -760 ml    Net IO Since Admission: -6,806 mL [04/22/22 1509]  Wt Readings from Last 3 Encounters:  04/10/22 65 kg  02/21/22 65.8 kg  12/04/21 65.7 kg     Unresulted Labs (From admission, onward)    None     Data Reviewed: I have personally reviewed following labs and imaging studies CBC: Recent Labs  Lab 04/16/22 0153 04/20/22 0127  WBC 15.0* 16.9*  HGB 14.6 13.8  HCT 45.6 42.0  MCV 95.8 96.6  PLT 239 417   Basic Metabolic Panel: Recent Labs  Lab 04/16/22 0153 04/20/22 0127  NA 137 136  K 3.9 4.7  CL 96* 97*  CO2 34* 30  GLUCOSE 113* 131*  BUN 19 12  CREATININE 0.61 0.67  CALCIUM 9.4 9.1  MG  --  2.2   CBG: Recent Labs  Lab 04/18/22 0634 04/18/22 1155 04/18/22 1615 04/21/22 1305 04/21/22 1705  GLUCAP 111* 143* 146* 118* 125*   No results found for this or any previous visit (from the past 240 hour(s)).   Antimicrobials: Anti-infectives (From admission, onward)    Start     Dose/Rate Route Frequency Ordered Stop   04/09/22 1215  doxycycline (VIBRA-TABS) tablet 100 mg        100 mg Oral Every 12 hours 04/09/22 1201 04/13/22 2121     Culture/Microbiology    Component Value Date/Time   SDES BLOOD LEFT ANTECUBITAL 07/05/2019 1925   SPECREQUEST  07/05/2019 1925    BOTTLES DRAWN AEROBIC AND ANAEROBIC Blood Culture adequate volume   CULT  07/05/2019 1925    NO GROWTH 5 DAYS Performed at [facility], [place] [address] [place], [place] [address]    REPTSTATUS 07/10/2019 FINAL 07/05/2019 1925  Radiology Studies: No results found.  LOS: 13 days  Antonieta Pert, MD [facility]  04/22/2022, 3:09 PM

## 2022-04-23 DIAGNOSIS — J441 Chronic obstructive pulmonary disease with (acute) exacerbation: Secondary | ICD-10-CM | POA: Diagnosis not present

## 2022-04-23 NOTE — Progress Notes (Signed)
Mobility Specialist Progress Note:   04/23/22 1348  Mobility  Activity Ambulated with assistance in room  Level of Assistance Standby assist, set-up cues, supervision of patient - no hands on  Assistive Device Front wheel walker  Distance Ambulated (ft) 40 ft  Activity Response Tolerated well  $Mobility charge 1 Mobility   Pt received in chair willing to participate in mobility. No complaints of pain. Left in bed with call bell in reach and all needs met.   Abigail Castillo Mobility Specialist Please contact via [employer] or  Rehab Office at [phone]

## 2022-04-23 NOTE — NC FL2 (Signed)
[place] LEVEL OF CARE SCREENING TOOL     IDENTIFICATION  Patient Name: Abigail Castillo Birthdate: [birth year] Sex: female Admission Date (Current Location): 04/09/2022  [facility] and [place] Number:  Herbalist and Address:  [facility] [place], [place] [address] [place], [place] [address]      Provider Number: [id number]  Attending Physician Name and Address:  Antonieta Pert, MD  Relative Name and Phone Number:       Current Level of Care: Hospital Recommended Level of Care: [place] Prior Approval Number:    Date Approved/Denied:   PASRR Number: [id number]  Discharge Plan: SNF    Current Diagnoses: Patient Active Problem List   Diagnosis Date Noted   Hyponatremia 04/10/2022   On home oxygen therapy 12/24/2021   Ductal carcinoma in situ (DCIS) of left breast 06/03/2021   Hepatic steatosis 05/13/2021   Elevated blood pressure reading without diagnosis of hypertension 09/14/2020   Acute on chronic respiratory failure with hypoxia ([place]) 07/05/2019   Chronic respiratory failure ([place]) 08/25/2017   Allergic rhinitis 06/16/2017   Schizoaffective disorder ([place]) 06/30/2016   GERD (gastroesophageal reflux disease) 11/02/2015   Esophageal stricture 03/14/2015   Mild hyperlipidemia 02/03/2014   Vitamin D deficiency 02/03/2014   Encounter for routine gynecological examination 06/14/2013   Personal history of colonic polyps 03/10/2012   B12 deficiency 11/11/2010   Memory change 11/04/2010   HYPERTRIGLYCERIDEMIA 06/14/2010   Depression 05/31/2009   History of tobacco abuse 03/02/2008   Osteoporosis 04/05/2007   COPD (chronic obstructive pulmonary disease) ([place]) 03/24/2007   HOARSENESS, CHRONIC 03/24/2007    Orientation RESPIRATION BLADDER Height & Weight     Self, Time, Situation, Place  O2 Continent Weight: 157 lb 6.5 oz (71.4 kg) Height:  '5\' 2"'$  (157.5 cm)  BEHAVIORAL SYMPTOMS/MOOD NEUROLOGICAL BOWEL NUTRITION STATUS       Incontinent Diet (please see discharge summary)  AMBULATORY STATUS COMMUNICATION OF NEEDS Skin   Limited Assist Verbally                         Personal Care Assistance Level of Assistance  Bathing, Feeding, Dressing Bathing Assistance: Limited assistance Feeding assistance: Independent Dressing Assistance: Limited assistance     Functional Limitations Info  Sight, Hearing, Speech Sight Info: Impaired Hearing Info: Impaired Speech Info: Adequate    SPECIAL CARE FACTORS FREQUENCY  PT (By licensed PT), OT (By licensed OT)     PT Frequency: 5x per week OT Frequency: 5x per week            Contractures Contractures Info: Not present    Additional Factors Info  Code Status, Allergies Code Status Info: FUll Code Allergies Info: Sulfonamide Derivatives           Current Medications (04/23/2022):  This is the current hospital active medication list Current Facility-Administered Medications  Medication Dose Route Frequency Provider Last Rate Last Admin   acetaminophen (TYLENOL) tablet 650 mg  650 mg Oral Q6H PRN Regalado, Belkys A, MD   650 mg at 04/18/22 0931   albuterol (PROVENTIL) (2.5 MG/3ML) 0.083% nebulizer solution 2.5 mg  2.5 mg Inhalation Q4H PRN Wynetta Fines T, MD   2.5 mg at 04/19/22 1506   amLODipine (NORVASC) tablet 2.5 mg  2.5 mg Oral Daily Regalado, Belkys A, MD   2.5 mg at 04/23/22 0948   anastrozole (ARIMIDEX) tablet 1 mg  1 mg Oral Daily Lequita Halt, MD   1  mg at 04/23/22 0948   arformoterol (BROVANA) nebulizer solution 15 mcg  15 mcg Nebulization BID Wynetta Fines T, MD   15 mcg at 04/23/22 9379   And   umeclidinium bromide (INCRUSE ELLIPTA) 62.5 MCG/ACT 1 puff  1 puff Inhalation Daily Wynetta Fines T, MD   1 puff at 04/23/22 0845   bisacodyl (DULCOLAX) suppository 10 mg  10 mg Rectal Daily PRN Regalado, Belkys A, MD       enoxaparin (LOVENOX) injection 40 mg  40 mg Subcutaneous Q24H Wynetta Fines T, MD   40 mg at 04/21/22 1323   guaiFENesin  (MUCINEX) 12 hr tablet 1,200 mg  1,200 mg Oral BID Wynetta Fines T, MD   1,200 mg at 04/23/22 0948   ipratropium-albuterol (DUONEB) 0.5-2.5 (3) MG/3ML nebulizer solution 3 mL  3 mL Nebulization BID Regalado, Belkys A, MD   3 mL at 04/23/22 0842   lidocaine (LIDODERM) 5 % 1 patch  1 patch Transdermal Daily Regalado, Belkys A, MD   1 patch at 04/23/22 0950   LORazepam (ATIVAN) tablet 1 mg  1 mg Oral BID PRN Wynetta Fines T, MD       OLANZapine [facility]) tablet 7.5 mg  7.5 mg Oral Daily Wynetta Fines T, MD   7.5 mg at 04/23/22 0949   ondansetron (ZOFRAN) injection 4 mg  4 mg Intravenous Q6H PRN Kc, Maren [place], MD   4 mg at 04/21/22 1748   pantoprazole (PROTONIX) EC tablet 40 mg  40 mg Oral BID Regalado, Belkys A, MD   40 mg at 04/23/22 0948   polyethylene glycol (MIRALAX / GLYCOLAX) packet 17 g  17 g Oral BID Regalado, Belkys A, MD   17 g at 04/20/22 0240   predniSONE (DELTASONE) tablet 20 mg  20 mg Oral Q breakfast Kc, Ramesh, MD   20 mg at 04/23/22 0948   senna-docusate (Senokot-S) tablet 1 tablet  1 tablet Oral BID Regalado, Belkys A, MD   1 tablet at 04/20/22 9735     Discharge Medications: Please see discharge summary for a list of discharge medications.  Relevant Imaging Results:  Relevant Lab Results:   Additional Information SSN [id number]  Vinie Sill, LCSW

## 2022-04-23 NOTE — TOC Progression Note (Addendum)
Transition of Care (TOC) - Progression Note  Abigail Gibbons RN, BSN Transitions of Care Unit 4E- RN Case Manager See Treatment Team for direct phone #   Patient Details  Name: [name] MRN: [id number] Date of Birth: December 22, 1950  Transition of Care [facility]) CM/SW Contact  Abigail Castillo, Abigail Rabon, RN Phone Number: 04/23/2022, 12:29 PM  Clinical Narrative:    Per MD pt medically stable for transition home- however daughter has some concerns about patient returning home.   CM spoke with pt at bedside- pt not has talkative today as she has been, states she is nervious as possibly going home. Discussed that DME and HH are in place but that family would need to check in her more- pt reports she is not sure they can. Pt gave verbal permission for CM to call her daughter.   Call made to daughter- Abigail Castillo- per TC, Abigail Castillo shared her concerns that her and her brother do not feel pt is at her baseline- pt has been calling them for assistance- stating she needs help to bathroom, etc instead of hitting the call button for unit staff. Daughter feels pt is disoriented from her baseline.  Daughter also expressed that pt is alone at home during the day and often at night as well. Her brother works a lot and hours are not dependable as to when he gets home. PTA pt was able to make her own meals, take the dog in/out, and get to the bathroom on her own. Daughter voiced she needs to be able to do these things on return home and does not feel patient is able to at this time. Daughter shared that family works and are unable to stay with pt 24/7 or during the day. Daughter can stop by and check on patient on her way home from work. Per daughter pt has been in an ALF in past about 5/6 years ago- before coming to live with family again.   In light of the information shared by family- CM will speak with therapies and see if recommendations can be updated to SNF recommendations. Daughter would like for pt to get rehab  prior to return home. Have explained to daughter that insurance would need to approve and bed be available in order for pt to go to SNF/rehab- daughter voiced understanding.   CSW to f/u once updated  notes in by PT/OT- CM has spoken with PT regarding the need for updated notes.  MD aware of plan.   CM has updated [facility] who is following for DME needs (Bipap/nebulizer) as well as [facility] who has accepted for [facility] needs (RN/PT/OT/aide)  Patient updated as well and is agreeable to plan for SNF search and rehab stay if insurance approves.    Expected Discharge Plan: [place] Barriers to Discharge: Continued Medical Work up  Expected Discharge Plan and Services Expected Discharge Plan: [place]   Discharge Planning Services: CM Consult Post Acute Care Choice: Durable Medical Equipment, Home Health Living arrangements for the past 2 months: Mobile Home                 DME Arranged: Nebulizer machine, Oxygen, Bipap DME Agency: [facility] Date DME Agency Contacted: 04/15/22 Time DME Agency Contacted: 83 Representative spoke with at DME Agency: Erasmo Downer HH Arranged: PT [place]: [place] Date [place]: 04/18/22 Time [place]: 56 Representative spoke with at [place]: [place] ([place]) Interventions  Readmission Risk Interventions     No data to display

## 2022-04-23 NOTE — Plan of Care (Signed)
  Problem: Activity: Goal: Risk for activity intolerance will decrease Outcome: Progressing   

## 2022-04-23 NOTE — Progress Notes (Signed)
PROGRESS NOTE Abigail Castillo  TIW:[id number] DOB: [birth year] DOA: 04/09/2022 PCP: [name], NP   Brief Narrative/Hospital Course: 71 year old with a history of COPD, chronic hypoxic respiratory failure on 2-3 L nasal cannula home oxygen support, moderate pulmonary hypertension, breast cancer status postlumpectomy and HRT, anxiety, and depression who presented with worsening shortness of breath for 2 to 3 days.  Patient was seen in the ED, admitted for acute exacerbation of severe COPD acute on chronic hypoxic respiratory failure needing BiPAP CT scan negative for PE found to have tracheobronchomalacia, seen by pulmonary.   Subjective: Seen this morning, no complaints, on 3 L nasal cannula.   Mobilized with PT but weak Assessment and Plan: Principal Problem:   COPD (chronic obstructive pulmonary disease) (HCC) Active Problems:   Depression   GERD (gastroesophageal reflux disease)   Acute on chronic respiratory failure with hypoxia (HCC)   Ductal carcinoma in situ (DCIS) of left breast   Hyponatremia  Acute exacerbation of severe COPD Acute chronic hypoxic respiratory failure: Initially needed BiPAP, CTA negative for PE.Completed 5 days of doxycycline.  Overall respiration is improving> but with ambulation and needing more oxygen, continue PT OT, planning for a stair PT today.  Continue  Brovana Incruse, prednisone 20 mg continue for 3 more days.Patient was seen by pulmonary - advised she will need outpatient follow-up for tracheomalacia stent versus CPAP/BiPAP, TOC/case worker trying to get BiPAP for patient> which is being arranged upon discharge.She needs Stiolto rx on dc.   Tracheobronchomalacia:Noted on CT during this admission - will need outpatient Pulmonary follow-up - [facility] is attempting to arrange home NIMV due to the fact that this patient has severe chronic respiratory failure due to severe COPD and tracheobronchomalacia.She requires frequent durations of respiratory  support and deteriorates quickly in the absence of noninvasive mechanical ventilation.  BiPAP alone has been considered but ruled out as insufficient.  Interruption or failure to provide NIMV would quickly lead to exacerbation of the patient's condition and result in hospitalization and probable harm to the patient.  Continued use of NIMV would allow [place] to avoid this.  Without use of NIMV there is significant risk of decline in the patient's health status specifically caused by poor ventilation and increasing CO2 retention which will ultimately deleteriously affect her health and likely lead to readmission.   Anxiety/depression mentation and mood stable.  Continue her olanzapine and Ativan. Hypertension BP controlled on amlodipine 2.'5MG'$   History of breast cancer status post-lumpectomy:Continue anastrozole  DVT prophylaxis: enoxaparin (LOVENOX) injection 40 mg Start: 04/09/22 1215 Code Status:   Code Status: Full Code Family Communication: plan of care discussed with patient at bedside. Patient status is: Inpatient because of respiratory failure and dyspnea Level of care: Med-Surg   Dispo: The patient is from: Home            Anticipated disposition: Discussed with patient's and daughter-she is concerned that patient will be home alone and will be unable to use restroom- reluctant for home, will look for skilled nursing facility [facility] consulted  Objective: Vitals last 24 hrs: Vitals:   04/23/22 0737 04/23/22 0843 04/23/22 0844 04/23/22 1201  BP: 123/73   123/81  Pulse: 85   98  Resp: 20   20  Temp: 97.6 F (36.4 C)     TempSrc: Oral   Oral  SpO2: 91% 92% 91% 93%  Weight:      Height:       Weight change:   Physical Examination: General exam: AAOX3, weak,older appearing HEENT:Oral  mucosa moist, Ear/Nose WNL grossly, dentition normal. Respiratory system: bilaterally diminished BS, crackles from upper airway no use of accessory muscle Cardiovascular system: S1 & S2 +, regular rate, JVD  NEG. Gastrointestinal system: Abdomen soft, NT,ND,BS+ Nervous System:Alert, awake, moving extremities and grossly nonfocal Extremities: LE ankle edema NEG, lower extremities warm Skin: No rashes,no icterus. MSK: Normal muscle bulk,tone, power    Medications reviewed:  Scheduled Meds:  amLODipine  2.5 mg Oral Daily   anastrozole  1 mg Oral Daily   arformoterol  15 mcg Nebulization BID   And   umeclidinium bromide  1 puff Inhalation Daily   enoxaparin (LOVENOX) injection  40 mg Subcutaneous Q24H   guaiFENesin  1,200 mg Oral BID   ipratropium-albuterol  3 mL Nebulization BID   lidocaine  1 patch Transdermal Daily   OLANZapine  7.5 mg Oral Daily   pantoprazole  40 mg Oral BID   polyethylene glycol  17 g Oral BID   predniSONE  20 mg Oral Q breakfast   senna-docusate  1 tablet Oral BID  Continuous Infusions:   Diet Order             Diet regular Room service appropriate? Yes with Assist; Fluid consistency: Thin  Diet effective now                   Intake/Output Summary (Last 24 hours) at 04/23/2022 1250 Last data filed at 04/23/2022 0739 Gross per 24 hour  Intake 480 ml  Output --  Net 480 ml    Net IO Since Admission: -6,566 mL [04/23/22 1250]  Wt Readings from Last 3 Encounters:  04/23/22 71.4 kg  02/21/22 65.8 kg  12/04/21 65.7 kg     Unresulted Labs (From admission, onward)    None     Data Reviewed: I have personally reviewed following labs and imaging studies CBC: Recent Labs  Lab 04/20/22 0127  WBC 16.9*  HGB 13.8  HCT 42.0  MCV 96.6  PLT 093  Basic Metabolic Panel: Recent Labs  Lab 04/20/22 0127  NA 136  K 4.7  CL 97*  CO2 30  GLUCOSE 131*  BUN 12  CREATININE 0.67  CALCIUM 9.1  MG 2.2  CBG: Recent Labs  Lab 04/18/22 0634 04/18/22 1155 04/18/22 1615 04/21/22 1305 04/21/22 1705  GLUCAP 111* 143* 146* 118* 125*  No results found for this or any previous visit (from the past 240 hour(s)).  Antimicrobials: Anti-infectives (From  admission, onward)    Start     Dose/Rate Route Frequency Ordered Stop   04/09/22 1215  doxycycline (VIBRA-TABS) tablet 100 mg        100 mg Oral Every 12 hours 04/09/22 1201 04/13/22 2121     Culture/Microbiology    Component Value Date/Time   SDES BLOOD LEFT ANTECUBITAL 07/05/2019 1925   SPECREQUEST  07/05/2019 1925    BOTTLES DRAWN AEROBIC AND ANAEROBIC Blood Culture adequate volume   CULT  07/05/2019 1925    NO GROWTH 5 DAYS Performed at [facility], [place] [address] [place], [place] [address]    REPTSTATUS 07/10/2019 FINAL 07/05/2019 1925  Radiology Studies: No results found.  LOS: 14 days  Antonieta Pert, MD [facility]  04/23/2022, 12:50 PM

## 2022-04-23 NOTE — Progress Notes (Addendum)
Physical Therapy Treatment Patient Details Name: Abigail Castillo MRN: [id number] DOB: 1950/07/30 Today's Date: 04/23/2022   History of Present Illness Pt is a 71 y.o. F who presents 04/09/2022 with COPD with acute exacerbation. Significant PMH: COPD, chronic respiratory failure with hypoxemia, COVID-19, osteoporosis, tobacco abuse.    PT Comments    Pt progressing with mobility. Today's session focused on transfers and ambulation. Transferred to [facility] with supervision and ambulated 16 ft min guard with RW. Family not present during session to confirm but cognition appears to be altered with frequent blank stares, unsure if baseline. Pt remains limited by generalized weakness, decreased activity tolerance, and impaired balance strategies/postural reactions. Continue to recommend acute PT services to maximize functional mobility and independence. Daughter reports family unable to provide necessary assist at home, reports pt at baseline is independent around house and with caring for dog, therefore recommending SNF based on concerns listed above.  SpO2 >92% on 3L [place]    Recommendations for follow up therapy are one component of a multi-disciplinary discharge planning process, led by the attending physician.  Recommendations may be updated based on patient status, additional functional criteria and insurance authorization.  Follow Up Recommendations  Skilled nursing-short term rehab (<3 hours/day) (family unable to provide necessary support)     Assistance Recommended at Discharge Set up Supervision/Assistance  Patient can return home with the following Assistance with cooking/housework;Assist for transportation;Help with stairs or ramp for entrance;A little help with walking and/or transfers;A little help with bathing/dressing/bathroom   Equipment Recommendations  Other (comment) (TBD)    Recommendations for Other Services       Precautions / Restrictions Precautions Precautions: Other  (comment);Fall Precaution Comments: watch O2 Restrictions Weight Bearing Restrictions: No     Mobility  Bed Mobility Overal bed mobility: Needs Assistance Bed Mobility: Supine to Sit     Supine to sit: Supervision, HOB elevated     General bed mobility comments: supervision to sit EOB with use of single handrail    Transfers Overall transfer level: Needs assistance Equipment used: Rolling walker (2 wheels) Transfers: Bed to chair/wheelchair/BSC, Sit to/from Stand Sit to Stand: Supervision   Step pivot transfers: Min guard       General transfer comment: min guard transfer to [facility] with cues for RW negotiation    Ambulation/Gait Ambulation/Gait assistance: Min guard Gait Distance (Feet): 16 Feet Assistive device: Rolling walker (2 wheels) Gait Pattern/deviations: Step-through pattern, Decreased stride length Gait velocity: decreased     General Gait Details: stiff-like gait pattern with caution during turns and cues necessary for RW negotiation around obstacles in room   Stairs             Wheelchair Mobility    Modified Rankin (Stroke Patients Only)       Balance Overall balance assessment: Needs assistance Sitting-balance support: Feet supported, No upper extremity supported Sitting balance-Leahy Scale: Good Sitting balance - Comments: prolonged sitting EOB wihout LOB while conversing about location to urinate (BSC, bathroom, or purewick)   Standing balance support: During functional activity Standing balance-Leahy Scale: Fair Standing balance comment: static standing during dependent pericare without UE support, proceeded to ambulate with B UE support of RW                            Cognition Arousal/Alertness: Awake/alert Behavior During Therapy: Flat affect Overall Cognitive Status: No family/caregiver present to determine baseline cognitive functioning  General Comments:  inconsistently follows one step commands with increased time, unsure if pt comprehending request due to blank stare; responded to comments with literal response (ex. "push on the bed to stand" and then proceeded to push into bed several times with fist)        Exercises      General Comments General comments (skin integrity, edema, etc.): SpO2 >92% on 3L [place] during session, pt endorses no SOB during mobility      Pertinent Vitals/Pain Pain Assessment Faces Pain Scale: Hurts a little bit Pain Location: generalized Pain Descriptors / Indicators: Tiring Pain Intervention(s): Monitored during session    Home Living                          Prior Function            PT Goals (current goals can now be found in the care plan section) Acute Rehab PT Goals Patient Stated Goal: to improve breathing PT Goal Formulation: With patient Time For Goal Achievement: 04/26/22 Potential to Achieve Goals: Good Progress towards PT goals: Progressing toward goals    Frequency    Min 3X/week      PT Plan Current plan remains appropriate    Co-evaluation              AM-PAC PT "6 Clicks" Mobility   Outcome Measure  Help needed turning from your back to your side while in a flat bed without using bedrails?: A Little Help needed moving from lying on your back to sitting on the side of a flat bed without using bedrails?: A Little Help needed moving to and from a bed to a chair (including a wheelchair)?: A Little Help needed standing up from a chair using your arms (e.g., wheelchair or bedside chair)?: A Little Help needed to walk in hospital room?: Total Help needed climbing 3-5 steps with a railing? : Total 6 Click Score: 14    End of Session Equipment Utilized During Treatment: Oxygen;Gait belt Activity Tolerance: Patient tolerated treatment well Patient left: in chair;with call bell/phone within reach;with chair alarm set Nurse Communication: Mobility status PT  Visit Diagnosis: Difficulty in walking, not elsewhere classified (R26.2);Unsteadiness on feet (R26.81);Other abnormalities of gait and mobility (R26.89)     Time: 0349-1791 PT Time Calculation (min) (ACUTE ONLY): 27 min  Charges:  $Therapeutic Activity: 23-37 mins                     Chipper Oman, SPT    Lyons Keera Altidor 04/23/2022, 12:22 PM

## 2022-04-24 DIAGNOSIS — J441 Chronic obstructive pulmonary disease with (acute) exacerbation: Secondary | ICD-10-CM | POA: Diagnosis not present

## 2022-04-24 NOTE — Progress Notes (Signed)
PROGRESS NOTE Abigail Castillo  OEU:[id number] DOB: March 19, 1951 DOA: 04/09/2022 PCP: [name], NP   Brief Narrative/Hospital Course: 71 year old with a history of COPD, chronic hypoxic respiratory failure on 2-3 L nasal cannula home oxygen support, moderate pulmonary hypertension, breast cancer status postlumpectomy and HRT, anxiety, and depression who presented with worsening shortness of breath for 2 to 3 days.  Patient was seen in the ED, admitted for acute exacerbation of severe COPD acute on chronic hypoxic respiratory failure needing BiPAP CT scan negative for PE found to have tracheobronchomalacia, seen by pulmonary. Respiratory is overall stabilized doing well on nasal cannula, planning to discharge on home BiPAP Patient lives alone with deconditioning at this time looking into skilled nursing facility.  Subjective: Seen and examined this morning has no complaint.  Resting comfortably.    Assessment and Plan: Principal Problem:   COPD (chronic obstructive pulmonary disease) (HCC) Active Problems:   Depression   GERD (gastroesophageal reflux disease)   Acute on chronic respiratory failure with hypoxia (HCC)   Ductal carcinoma in situ (DCIS) of left breast   Hyponatremia  Acute exacerbation of severe COPD Acute chronic hypoxic respiratory failure: Initially needed BiPAP, CTA negative for PE.Completed 5 days of doxycycline.  Overall respiration is improving> but with ambulation and needing more oxygen, continue PT OT, planning for a stair PT today.  Continue  Brovana Incruse, prednisone 20 mg continue for 2 more days.she will need outpatient follow-up for tracheomalacia Case worker trying to get BiPAP for patient> which is being arranged upon discharge.She needs Stiolto rx on dc.   Tracheobronchomalacia:Noted on CT during this admission - will need outpatient Pulmonary follow-up - TRH is attempting to arrange home NIMV due to the fact that this patient has severe chronic  respiratory failure due to severe COPD and tracheobronchomalacia.She requires frequent durations of respiratory support and deteriorates quickly in the absence of noninvasive mechanical ventilation.  BiPAP alone has been considered but ruled out as insufficient.  Interruption or failure to provide NIMV would quickly lead to exacerbation of the patient's condition and result in hospitalization and probable harm to the patient.  Continued use of NIMV would allow [place] to avoid this.  Without use of NIMV there is significant risk of decline in the patient's health status specifically caused by poor ventilation and increasing CO2 retention which will ultimately deleteriously affect her health and likely lead to readmission.   Anxiety/depression mood is stable.  Continue her olanzapine and Ativan. Hypertension BP controlled on amlodipine 2.'5MG'$   History of breast cancer status post-lumpectomy:Continue anastrozole  DVT prophylaxis: enoxaparin (LOVENOX) injection 40 mg Start: 04/09/22 1215 Code Status:   Code Status: Full Code Family Communication: plan of care discussed with patient at bedside. Patient status is: Inpatient because of respiratory failure and dyspnea Level of care: Med-Surg   Dispo: The patient is from: Home            Anticipated disposition: Discussed with patient's and daughter 11/29 -she is concerned that patient will be home alone and will be unable to use restroom/or mobilize without any help.  At this time planning for skilled nursing facility and medically stable for discharge pending placement  Objective: Vitals last 24 hrs: Vitals:   04/24/22 0100 04/24/22 0332 04/24/22 0753 04/24/22 0908  BP: (!) 145/78 (!) 133/94  116/70  Pulse: 96 97  (!) 101  Resp: '17 17  20  '$ Temp: 97.8 F (36.6 C) 98 F (36.7 C)  98.7 F (37.1 C)  TempSrc: Oral Oral  Oral  SpO2: 96% 98% 93% 98%  Weight:      Height:       Weight change:   Physical Examination: General exam: alert awake, older  than stated age HEENT:Oral mucosa moist, Ear/Nose WNL grossly Respiratory system: Bilaterally diminished breath sound at bases crackles,, [place] with Cardiovascular system: S1 & S2 +, No JVD. Gastrointestinal system: Abdomen soft,NT,ND, BS+ Nervous System: Alert, awake, moving extremities,follows commands. Extremities: LE edema NEG,distal peripheral pulses palpable.  Skin: No rashes,no icterus. MSK: Normal muscle bulk,tone, power   Medications reviewed:  Scheduled Meds:  amLODipine  2.5 mg Oral Daily   anastrozole  1 mg Oral Daily   arformoterol  15 mcg Nebulization BID   And   umeclidinium bromide  1 puff Inhalation Daily   enoxaparin (LOVENOX) injection  40 mg Subcutaneous Q24H   guaiFENesin  1,200 mg Oral BID   ipratropium-albuterol  3 mL Nebulization BID   lidocaine  1 patch Transdermal Daily   OLANZapine  7.5 mg Oral Daily   pantoprazole  40 mg Oral BID   polyethylene glycol  17 g Oral BID   predniSONE  20 mg Oral Q breakfast   senna-docusate  1 tablet Oral BID  Continuous Infusions:   Diet Order             Diet regular Room service appropriate? Yes with Assist; Fluid consistency: Thin  Diet effective now                   Intake/Output Summary (Last 24 hours) at 04/24/2022 1142 Last data filed at 04/24/2022 0904 Gross per 24 hour  Intake --  Output 400 ml  Net -400 ml     Net IO Since Admission: -6,966 mL [04/24/22 1142]  Wt Readings from Last 3 Encounters:  04/23/22 71.4 kg  02/21/22 65.8 kg  12/04/21 65.7 kg     Unresulted Labs (From admission, onward)    None     Data Reviewed: I have personally reviewed following labs and imaging studies CBC: Recent Labs  Lab 04/20/22 0127  WBC 16.9*  HGB 13.8  HCT 42.0  MCV 96.6  PLT 673   Basic Metabolic Panel: Recent Labs  Lab 04/20/22 0127  NA 136  K 4.7  CL 97*  CO2 30  GLUCOSE 131*  BUN 12  CREATININE 0.67  CALCIUM 9.1  MG 2.2   CBG: Recent Labs  Lab 04/18/22 0634  04/18/22 1155 04/18/22 1615 04/21/22 1305 04/21/22 1705  GLUCAP 111* 143* 146* 118* 125*   No results found for this or any previous visit (from the past 240 hour(s)).  Antimicrobials: Anti-infectives (From admission, onward)    Start     Dose/Rate Route Frequency Ordered Stop   04/09/22 1215  doxycycline (VIBRA-TABS) tablet 100 mg        100 mg Oral Every 12 hours 04/09/22 1201 04/13/22 2121     Culture/Microbiology    Component Value Date/Time   SDES BLOOD LEFT ANTECUBITAL 07/05/2019 1925   SPECREQUEST  07/05/2019 1925    BOTTLES DRAWN AEROBIC AND ANAEROBIC Blood Culture adequate volume   CULT  07/05/2019 1925    NO GROWTH 5 DAYS Performed at [facility], [place] [address], [place],  [address]    REPTSTATUS 07/10/2019 FINAL 07/05/2019 1925  Radiology Studies: No results found.  LOS: 15 days  Antonieta Pert, MD [facility]  04/24/2022, 11:42 AM

## 2022-04-24 NOTE — Progress Notes (Signed)
Mobility Specialist Progress Note:   04/24/22 1009  Mobility  Activity Ambulated with assistance in room  Level of Assistance Standby assist, set-up cues, supervision of patient - no hands on  Assistive Device Front wheel walker  Distance Ambulated (ft) 20 ft  Activity Response Tolerated well  $Mobility charge 1 Mobility   Pt received in bed willing to participate in mobility. No complaints of pain. Left in chair with call bell in reach and all needs met.   Gareth Eagle Dameisha Tschida Mobility Specialist Please contact via [employer] or  Rehab Office at [phone]

## 2022-04-24 NOTE — TOC Progression Note (Signed)
Transition of Care [facility]) - Progression Note    Patient Details  Name: SHALEN PETRAK MRN: [id number] Date of Birth: 1950/05/31  Transition of [facility]) CM/SW [place], [place] Phone Number: 04/24/2022, 1:19 PM  Clinical Narrative:     CSW spoke with patient's daughter- CSW provided bed offers. She choose [facility]. CSW sent message to [place] to confirm availability. CSW waiting on response.   TOC will continue to follow and assist with discharge planning.  Thurmond Butts, MSW, LCSW Clinical Social Worker    Expected Discharge Plan: [place] Barriers to Discharge: Continued Medical Work up  Expected Discharge Plan and Services Expected Discharge Plan: [place]   Discharge Planning Services: CM Consult Post Acute Care Choice: Durable Medical Equipment, Home Health Living arrangements for the past 2 months: Mobile Home                 DME Arranged: Nebulizer machine, Oxygen, Bipap DME Agency: [employer] Date DME Agency Contacted: 04/15/22 Time DME Agency Contacted: 50 Representative spoke with at DME Agency: Erasmo Downer [place]: PT [place]: [place] Date [place]: 04/18/22 Time [place]: 48 Representative spoke with at [place]: [place] Determinants of Health ([place]) Interventions    Readmission Risk Interventions     No data to display

## 2022-04-24 NOTE — Plan of Care (Signed)
  Problem: Clinical Measurements: Goal: Will remain free from infection Outcome: Progressing   Problem: Health Behavior/Discharge Planning: Goal: Ability to manage health-related needs will improve Outcome: Not Progressing   Problem: Clinical Measurements: Goal: Respiratory complications will improve Outcome: Not Progressing

## 2022-04-25 ENCOUNTER — Encounter (HOSPITAL_COMMUNITY): Payer: Self-pay | Admitting: Internal Medicine

## 2022-04-25 ENCOUNTER — Telehealth: Payer: Self-pay | Admitting: Acute Care

## 2022-04-25 ENCOUNTER — Inpatient Hospital Stay (HOSPITAL_COMMUNITY): Payer: Medicare Other

## 2022-04-25 DIAGNOSIS — R600 Localized edema: Secondary | ICD-10-CM

## 2022-04-25 DIAGNOSIS — J441 Chronic obstructive pulmonary disease with (acute) exacerbation: Secondary | ICD-10-CM | POA: Diagnosis not present

## 2022-04-25 DIAGNOSIS — J9621 Acute and chronic respiratory failure with hypoxia: Secondary | ICD-10-CM | POA: Diagnosis not present

## 2022-04-25 DIAGNOSIS — J9809 Other diseases of bronchus, not elsewhere classified: Secondary | ICD-10-CM | POA: Diagnosis not present

## 2022-04-25 LAB — CBC
HCT: 39.7 % (ref 36.0–46.0)
Hemoglobin: 13.3 g/dL (ref 12.0–15.0)
MCH: 31.7 pg (ref 26.0–34.0)
MCHC: 33.5 g/dL (ref 30.0–36.0)
MCV: 94.7 fL (ref 80.0–100.0)
Platelets: 210 10*3/uL (ref 150–400)
RBC: 4.19 MIL/uL (ref 3.87–5.11)
RDW: 13.2 % (ref 11.5–15.5)
WBC: 16.3 10*3/uL — ABNORMAL HIGH (ref 4.0–10.5)
nRBC: 0 % (ref 0.0–0.2)

## 2022-04-25 LAB — BASIC METABOLIC PANEL
Anion gap: 8 (ref 5–15)
BUN: 13 mg/dL (ref 8–23)
CO2: 33 mmol/L — ABNORMAL HIGH (ref 22–32)
Calcium: 9 mg/dL (ref 8.9–10.3)
Chloride: 96 mmol/L — ABNORMAL LOW (ref 98–111)
Creatinine, Ser: 0.66 mg/dL (ref 0.44–1.00)
GFR, Estimated: >60 mL/min (ref >60)
Glucose, Bld: 130 mg/dL — ABNORMAL HIGH (ref 70–99)
Potassium: 3.9 mmol/L (ref 3.5–5.1)
Sodium: 137 mmol/L (ref 135–145)

## 2022-04-25 LAB — D-DIMER, QUANTITATIVE: D-Dimer, Quant: <0.27 ug/mL-FEU (ref 0.00–0.50)

## 2022-04-25 LAB — PROCALCITONIN: Procalcitonin: <0.1 ng/mL

## 2022-04-25 LAB — BRAIN NATRIURETIC PEPTIDE: B Natriuretic Peptide: 44.2 pg/mL (ref 0.0–100.0)

## 2022-04-25 MED ORDER — GUAIFENESIN ER 600 MG PO TB12: 1200.0000 mg | ORAL_TABLET | Freq: Two times a day (BID) | ORAL | 0 refills | Status: DC

## 2022-04-25 MED ORDER — FUROSEMIDE 10 MG/ML IJ SOLN: 40.0000 mg | Freq: Once | INTRAMUSCULAR | Status: DC

## 2022-04-25 MED ORDER — AMLODIPINE BESYLATE 2.5 MG PO TABS: 2.5000 mg | ORAL_TABLET | Freq: Every day | ORAL | 0 refills | Status: DC

## 2022-04-25 MED ORDER — PREDNISONE 20 MG PO TABS: 20.0000 mg | ORAL_TABLET | Freq: Every day | ORAL | 0 refills | Status: DC

## 2022-04-25 MED ORDER — STIOLTO RESPIMAT 2.5-2.5 MCG/ACT IN AERS: INHALATION_SPRAY | RESPIRATORY_TRACT | 2 refills | Status: DC

## 2022-04-25 MED ORDER — ALBUTEROL SULFATE (2.5 MG/3ML) 0.083% IN NEBU
2.5000 mg | INHALATION_SOLUTION | Freq: Four times a day (QID) | RESPIRATORY_TRACT | Status: DC
  Administered 2022-04-25 – 2022-04-26 (×5): 2.5 mg via RESPIRATORY_TRACT
  Filled 2022-04-25 (×6): qty 3

## 2022-04-25 MED ORDER — HYDROCHLOROTHIAZIDE 25 MG PO TABS
25.0000 mg | ORAL_TABLET | Freq: Once | ORAL | Status: AC
  Administered 2022-04-25: 25 mg via ORAL
  Filled 2022-04-25: qty 1

## 2022-04-25 NOTE — Progress Notes (Signed)
Plan for pt to go to [facility] for rehab, CM has notified Claiborne Billings w/ [facility] - they will follow for any [facility] needs post rehab stay. [employer] also will follow at [facility] for home Bipap needs, they have also submitted for home NIV to see if they can get approval from insurance while pt is at [place] is for Bipap vs NIV post rehab stay pending insurance auth. [employer] also has the order for home nebulizer.

## 2022-04-25 NOTE — Progress Notes (Signed)
NAME:  Abigail Castillo, MRN:  [id number], DOB:  December 22, 1950, LOS: 86 ADMISSION DATE:  04/09/2022, CONSULTATION DATE: April 10, 2022 REFERRING MD: Tyrell Antonio, CHIEF COMPLAINT: Dyspnea  History of Present Illness:  71 year old female with COPD, chronic respiratory failure with hypoxemia who follows with Dr. Lamonte Sakai presented for shortness of breath to the emergency department yesterday.  She says that last week she got a cold and she has been having allergies.  She had significant postnasal drip and sinus congestion.  She has had some cough but not much mucus production.  Her primary complaint yesterday was shortness of breath.  She denies leg swelling.  She has not had hemoptysis or chest pain.  In the emergency department she has been treated with bronchodilators, steroids, and required noninvasive mechanical ventilation.  The noninvasive mechanical ventilation was just stopped and she says she is feeling a bit better.  She is currently on 4 L of oxygen, she uses 2-3 at home.  She says that she quit smoking several years ago.    Pertinent  Medical History    has a past medical history of Acute on chronic respiratory failure with hypoxia ([place]) (07/05/2019), Anxiety, Blood transfusion, Blood transfusion without reported diagnosis, Breast cancer ([place]) (05/13/2021), Chronic mental illness, COPD (chronic obstructive pulmonary disease) ([place]), COVID-19 virus infection (07/06/2019), DEPRESSION (05/31/2009), Emphysema of lung ([place]), Esophageal stricture (03/14/2015), GERD (gastroesophageal reflux disease), Hepatic steatosis, History of home oxygen therapy, History of pneumonia, Osteoporosis, Oxygen dependent, Shortness of breath dyspnea, Tobacco abuse, Vitamin B 12 deficiency, and Vitamin D deficiency.   Significant Hospital Events: Including procedures, antibiotic start and stop dates in addition to other pertinent events   April 09, 2022 respiratory viral panel negative for flu and COVID November 2015  chest x-ray images independently reviewed showing emphysema, interstitial changes in bases 11/16 pulmonary consulted for dypsnea. Rec COPD tx. Prolonged inpatient course for acute on chronic hypoxia, O2 not weaning.  CT 11/20 to rule out PE, showed tracheobronchomalacia 12/1 PCCM re-consulted for ongoing oxygen requirement.   Interim History / Subjective:   Episode of dyspnea this morning. Oxygen increased to 5 and needed 8L while ambulating. Patient feels better now. At her recent baseline. Cough productive for clear sputum. Denies fevers.  Felt like BiPAP was helping, but when she started breathing better she felt like she no longer needed it has.   Objective   Blood pressure (!) 148/97, pulse (!) 102, temperature 97.9 F (36.6 C), temperature source Oral, resp. rate 18, height '5\' 2"'$  (1.575 m), weight 71.4 kg, SpO2 94 %.        Intake/Output Summary (Last 24 hours) at 04/25/2022 1523 Last data filed at 04/25/2022 0800 Gross per 24 hour  Intake 240 ml  Output 600 ml  Net -360 ml    Filed Weights   04/09/22 0734 04/10/22 1242 04/23/22 0515  Weight: 65.8 kg 65 kg 71.4 kg    Examination: General: Frail elderly female in NAD HEENT: [place]/AT, PERRL, no JVD Neuro: Alert, oriented, non-focal. Slow to answer.  CV: Tachy, regular, no MRG PULM: Diminished breath sounds throughout.  GI: soft, bsx4 active  Extremities: warm/dry, 1+ edema  Skin: no rashes or lesions  Resolved Hospital Problem list     Assessment & Plan:   COPD with acute exacerbation: improved Chronic respiratory failure with hypoxemia  COPD and tracheobronchomalacia seem to be at baseline. She is tachycardic and has lower extremity edema. Need to rule out VTE and pulmonary edema. CXR c/w COPD no obvious edema  seen. I was able to wean her oxygen to 2L at rest with O2 sats remaining 94%  Plan: Check D. Dimer, BNP, PCT If d. Dimer positive we will repeat CTA Lasix '40mg'$  IV I&O Complete steroid taper Continue  Brovana, albuterol, Incruse with plans to DC on Stiolto Wean FiO2 as tolerated for SpO2 goal 88-95%   Georgann Housekeeper, AGACNP-BC [facility]  See Amion for personal pager PCCM on call pager [phone] until 7pm. Please call Elink 7p-7a. [phone]  04/25/2022 4:17 PM

## 2022-04-25 NOTE — TOC Transition Note (Signed)
Transition of Care [facility]) - CM/SW Discharge Note   Patient Details  Name: Abigail Castillo MRN: [id number] Date of Birth: Mar 18, 1951  Transition [facility] Care [facility]) CM/SW Contact:  Vinie Sill, LCSW Phone Number: 04/25/2022, 2:53 PM   Clinical Narrative:     Patient will Discharge to: [facility] Discharge Date: 04/25/2022 Family Notified: daughter Transport By: Corey Harold  Per MD patient is ready for discharge. RN, patient, and facility notified of discharge. Discharge Summary sent to facility. RN given number for [phone]. Ambulance transport requested for patient.   Clinical Social Worker signing off.  Thurmond Butts, MSW, LCSW Clinical Social Worker     Final next level of care: Skilled Nursing Facility Barriers to Discharge: Barriers Resolved   Patient Goals and CMS Choice Patient states their goals for this hospitalization and ongoing recovery are:: return home   Choice offered to / list presented to : Patient  Discharge Placement              Patient chooses bed at: [facility] Patient to be transferred to facility by: [place] Name of family member notified: daughter Patient and family notified of of transfer: 04/25/22  Discharge Plan and Services   Discharge Planning Services: CM Consult Post Acute Care Choice: Durable Medical Equipment, Home Health          DME Arranged: Nebulizer machine, Oxygen, Bipap DME Agency: [employer] Date DME Agency Contacted: 04/15/22 Time DME Agency Contacted: 1430 Representative spoke with at DME Agency: Erasmo Downer HH Arranged: PT [place] Agency: [place] Date [place]: 04/18/22 Time HH Agency Contacted: 40 Representative spoke with at [place]: [place] Determinants of Health ([place]) Interventions     Readmission Risk Interventions     No data to display

## 2022-04-25 NOTE — TOC Progression Note (Signed)
Transition of Care [facility]) - Progression Note    Patient Details  Name: [name] MRN: [id number] Date of Birth: 05/19/1951  Transition of Care [facility]) CM/SW [place], [place] Phone Number: 04/25/2022, 3:56 PM  Clinical Narrative:     PTAR cancelled.   Expected Discharge Plan: [place] Barriers to Discharge: Barriers Resolved  Expected Discharge Plan and Services Expected Discharge Plan: [place]   Discharge Planning Services: CM Consult Post Acute Care Choice: Durable Medical Equipment, Home Health Living arrangements for the past 2 months: Mobile Home Expected Discharge Date: 04/25/22               DME Arranged: Nebulizer machine, Oxygen, Bipap DME Agency: [employer] Date DME Agency Contacted: 04/15/22 Time DME Agency Contacted: 1430 Representative spoke with at DME Agency: [place]: PT [place]: [place] Date [place]: 04/18/22 Time [place]: 75 Representative spoke with at [place]: [place] Determinants of Health ([place]) Interventions    Readmission Risk Interventions     No data to display

## 2022-04-25 NOTE — Telephone Encounter (Signed)
Patient scheduled for HFU with Rexene Edison, NP on 12/15 at 10am/ reminder mailed to address on file. Patient is established with [facility] 11/2021 due back 03/2022 (front has attempted to schedule follow up- no response from patient), please advise on provider switch to BQ.

## 2022-04-25 NOTE — TOC Progression Note (Signed)
Transition of Care [facility]) - Progression Note    Patient Details  Name: Abigail Castillo MRN: [id number] Date of Birth: [birth year]  Transition of Care [facility]) CM/SW [place], [place] Phone Number: 04/25/2022, 11:23 AM  Clinical Narrative:     CSW received insurance authorization # [id number] reference # [id number] 12/1-12/5.  Thurmond Butts, MSW, LCSW Clinical Social Worker    Expected Discharge Plan: Skilled Nursing Facility Barriers to Discharge: Continued Medical Work up  Expected Discharge Plan and Services Expected Discharge Plan: [place]   Discharge Planning Services: CM Consult Post Acute Care Choice: Durable Medical Equipment, Home Health Living arrangements for the past 2 months: Mobile Home                 DME Arranged: Nebulizer machine, Oxygen, Bipap DME Agency: [employer] Date DME Agency Contacted: 04/15/22 Time DME Agency Contacted: 35 Representative spoke with at DME Agency: Erasmo Downer [place]: PT [place]: [place] Date [place]: 04/18/22 Time [place]: 32 Representative spoke with at [place]: [place] Determinants of Health ([place]) Interventions    Readmission Risk Interventions     No data to display

## 2022-04-25 NOTE — Progress Notes (Signed)
Physical Therapy Treatment Patient Details Name: Abigail Castillo MRN: [id number] DOB: [birth year] Today's Date: 04/25/2022   History of Present Illness Pt is a 71 y.o. F who presents 04/09/2022 with COPD with acute exacerbation. Significant PMH: COPD, chronic respiratory failure with hypoxemia, COVID-19, osteoporosis, tobacco abuse.    PT Comments    Pt steadily progressing with mobility. Today's session focused on ambulation and energy conservation. Pt ambulated 59fx2 min guard with RW, requiring a prolonged seated rest break (~5 min) due to reported feelings of SOB. Please see below for detailed events of oxygen status. Pt remains limited by generalized weakness, decreased activity tolerance, cognitive deficits, and impaired balance strategies/postural reactions. Continue to recommend acute PT services to maximize functional mobility and independence prior to d/c to SNF level therapies when medically appropriate.  SpO2 92% on 4L [place] at beginning of session; temporary desaturation to 81%, titrated up to 6L with readings of 85%, ultimately requiring 8L [place] to return to SpO2 >92%; pt to remain at 5L [place] at end of session with SpO2 >90% when pleth reliable     Recommendations for follow up therapy are one component of a multi-disciplinary discharge planning process, led by the attending physician.  Recommendations may be updated based on patient status, additional functional criteria and insurance authorization.  Follow Up Recommendations  Skilled nursing-short term rehab (<3 hours/day)     Assistance Recommended at Discharge Set up Supervision/Assistance  Patient can return home with the following Assistance with cooking/housework;Assist for transportation;Help with stairs or ramp for entrance;A little help with walking and/or transfers;A little help with bathing/dressing/bathroom   Equipment Recommendations  Other (comment) (TBD)    Recommendations for Other Services       Precautions /  Restrictions Precautions Precautions: Fall;Other (comment) Precaution Comments: watch O2 Restrictions Weight Bearing Restrictions: No     Mobility  Bed Mobility Overal bed mobility: Needs Assistance             General bed mobility comments: pt sitting up in recliner upon arrival; able to scoot hips back in chair with mod ind for increased time and use of B arm rests    Transfers Overall transfer level: Needs assistance Equipment used: Rolling walker (2 wheels) Transfers: Sit to/from Stand Sit to Stand: Supervision           General transfer comment: supervision to standx2 from various surface heights with cues for UE placement during ascent    Ambulation/Gait Ambulation/Gait assistance: Min guard Gait Distance (Feet): 20 Feet (+20) Assistive device: Rolling walker (2 wheels) Gait Pattern/deviations: Step-through pattern, Decreased stride length Gait velocity: decreased     General Gait Details: min guard during gait and pt able to negotiate obstacles with RW without verbal cues from therapy staff; one prolonged seated rest break required (~5 min) due to reported SOB; pt educated on pursed lip breathing and energy conservation during seated rest break   Stairs             Wheelchair Mobility    Modified Rankin (Stroke Patients Only)       Balance Overall balance assessment: Needs assistance   Sitting balance-Leahy Scale: Good Sitting balance - Comments: ant/post scooting in chair with no LOB   Standing balance support: Single extremity supported Standing balance-Leahy Scale: Fair Standing balance comment: static stand after mobility ~30 seconds with single UE support  Cognition Arousal/Alertness: Awake/alert Behavior During Therapy: Flat affect Overall Cognitive Status: No family/caregiver present to determine baseline cognitive functioning                                 General Comments:  improved ability to answer questions with an appropriate amount of time alloted; pt able to understand when rest break is necessary due to SOB        Exercises      General Comments General comments (skin integrity, edema, etc.): SpO2 92% on 4L [place] at beginning of session; temporary desaturation to 81%, titrated up to 6L with readings of 85%, ultimately requiring 8L [place] to return to SpO2 >92%; pt returned to 5L  with SpO2 >90% when pleth reliable      Pertinent Vitals/Pain Pain Assessment Faces Pain Scale: Hurts a little bit Pain Location: stomach Pain Descriptors / Indicators: Discomfort, Grimacing Pain Intervention(s): Monitored during session, Repositioned    Home Living                          Prior Function            PT Goals (current goals can now be found in the care plan section) Acute Rehab PT Goals Patient Stated Goal: to improve breathing PT Goal Formulation: With patient Time For Goal Achievement: 04/26/22 Potential to Achieve Goals: Good Progress towards PT goals: Progressing toward goals    Frequency    Min 2X/week      PT Plan Current plan remains appropriate    Co-evaluation              AM-PAC PT "6 Clicks" Mobility   Outcome Measure  Help needed turning from your back to your side while in a flat bed without using bedrails?: A Little Help needed moving from lying on your back to sitting on the side of a flat bed without using bedrails?: A Little Help needed moving to and from a bed to a chair (including a wheelchair)?: A Little Help needed standing up from a chair using your arms (e.g., wheelchair or bedside chair)?: A Little Help needed to walk in hospital room?: Total Help needed climbing 3-5 steps with a railing? : Total 6 Click Score: 14    End of Session Equipment Utilized During Treatment: Oxygen;Gait belt Activity Tolerance: Treatment limited secondary to medical complications (Comment);Other (comment) (increased O2  demands during mobility) Patient left: in chair;with call bell/phone within reach;with chair alarm set Nurse Communication: Mobility status;Other (comment) (increased oxygen demands) PT Visit Diagnosis: Difficulty in walking, not elsewhere classified (R26.2);Unsteadiness on feet (R26.81);Other abnormalities of gait and mobility (R26.89)     Time: 1411-1434 PT Time Calculation (min) (ACUTE ONLY): 23 min  Charges:  $Therapeutic Activity: 23-37 mins                     Chipper Oman, SPT    Abigail Castillo 04/25/2022, 3:54 PM

## 2022-04-25 NOTE — TOC Progression Note (Signed)
Transition of Care [facility]) - Progression Note    Patient Details  Name: JAHAYRA MAZO MRN: [id number] Date of Birth: 22-Jan-1951  Transition of Care [facility]) CM/SW [place], [place] Phone Number: 04/25/2022, 9:28 AM  Clinical Narrative:     Helene Kelp confirmed bed availability- insurance authorization has been started. CSW waiting on determination.  Thurmond Butts, MSW, LCSW Clinical Social Worker     Expected Discharge Plan: [place] Barriers to Discharge: Continued Medical Work up  Expected Discharge Plan and Services Expected Discharge Plan: [place]   Discharge Planning Services: CM Consult Post Acute Care Choice: Durable Medical Equipment, Home Health Living arrangements for the past 2 months: Mobile Home                 DME Arranged: Nebulizer machine, Oxygen, Bipap DME Agency: [employer] Date DME Agency Contacted: 04/15/22 Time DME Agency Contacted: 68 Representative spoke with at DME Agency: Erasmo Downer [place]: PT [place]: [place] Date [place]: 04/18/22 Time [place]: 46 Representative spoke with at [place]: [place] Determinants of Health ([place]) Interventions    Readmission Risk Interventions     No data to display

## 2022-04-25 NOTE — Progress Notes (Signed)
Patient placed on BiPAP 10/5 for the night, at this time.

## 2022-04-25 NOTE — Progress Notes (Signed)
Mobility Specialist Progress Note:   04/25/22 1054  Mobility  Activity Ambulated with assistance in room;Transferred to/from [facility]  Level of Assistance Standby assist, set-up cues, supervision of patient - no hands on  Assistive Device Front wheel walker  Distance Ambulated (ft) 20 ft  Activity Response Tolerated well  $Mobility charge 1 Mobility   Pre- Mobility: 95% SpO2 Post Mobility: 93% SpO2  Pt received in bed willing to participate in mobility. Complaints of feeling SOB, encouraged pursed lip breathing. Pt asking to use BSC, MinA for peri-care. Lft in chair with call bell in reach and all needs met.   Gareth [place] Mobility Specialist Please contact via [employer] or  Rehab Office at [phone]

## 2022-04-26 ENCOUNTER — Inpatient Hospital Stay (HOSPITAL_COMMUNITY): Payer: Medicare Other

## 2022-04-26 DIAGNOSIS — J9621 Acute and chronic respiratory failure with hypoxia: Secondary | ICD-10-CM | POA: Diagnosis not present

## 2022-04-26 DIAGNOSIS — J441 Chronic obstructive pulmonary disease with (acute) exacerbation: Secondary | ICD-10-CM | POA: Diagnosis not present

## 2022-04-26 LAB — PROCALCITONIN: Procalcitonin: <0.1 ng/mL

## 2022-04-26 LAB — ECHOCARDIOGRAM COMPLETE BUBBLE STUDY
Area-P 1/2: 2.42 cm2
S' Lateral: 1.8 cm

## 2022-04-26 MED ORDER — AZITHROMYCIN 500 MG PO TABS
500.0000 mg | ORAL_TABLET | ORAL | Status: DC
  Administered 2022-04-26 – 2022-05-12 (×8): 500 mg via ORAL
  Filled 2022-04-26 (×8): qty 1

## 2022-04-26 MED ORDER — MONTELUKAST SODIUM 10 MG PO TABS
10.0000 mg | ORAL_TABLET | Freq: Every day | ORAL | Status: DC
  Administered 2022-04-26 – 2022-05-11 (×16): 10 mg via ORAL
  Filled 2022-04-26 (×16): qty 1

## 2022-04-26 MED ORDER — IPRATROPIUM-ALBUTEROL 0.5-2.5 (3) MG/3ML IN SOLN
3.0000 mL | RESPIRATORY_TRACT | Status: DC
  Administered 2022-04-26: 3 mL via RESPIRATORY_TRACT
  Filled 2022-04-26: qty 3

## 2022-04-26 MED ORDER — IPRATROPIUM-ALBUTEROL 0.5-2.5 (3) MG/3ML IN SOLN
3.0000 mL | Freq: Four times a day (QID) | RESPIRATORY_TRACT | Status: DC
  Administered 2022-04-27 – 2022-04-30 (×15): 3 mL via RESPIRATORY_TRACT
  Filled 2022-04-26 (×15): qty 3

## 2022-04-26 MED ORDER — PREDNISONE 20 MG PO TABS: 20.0000 mg | ORAL_TABLET | Freq: Every day | ORAL | Status: DC

## 2022-04-26 MED ORDER — PREDNISONE 20 MG PO TABS
40.0000 mg | ORAL_TABLET | Freq: Every day | ORAL | Status: AC
  Administered 2022-04-27 – 2022-04-29 (×3): 40 mg via ORAL
  Filled 2022-04-26 (×3): qty 2

## 2022-04-26 MED ORDER — REVEFENACIN 175 MCG/3ML IN SOLN
175.0000 ug | Freq: Every day | RESPIRATORY_TRACT | Status: DC
  Administered 2022-04-26 – 2022-05-12 (×15): 175 ug via RESPIRATORY_TRACT
  Filled 2022-04-26 (×18): qty 3

## 2022-04-26 MED ORDER — ALBUTEROL SULFATE (2.5 MG/3ML) 0.083% IN NEBU
2.5000 mg | INHALATION_SOLUTION | Freq: Four times a day (QID) | RESPIRATORY_TRACT | Status: DC | PRN
  Administered 2022-04-27 – 2022-05-06 (×3): 2.5 mg via RESPIRATORY_TRACT
  Filled 2022-04-26 (×3): qty 3

## 2022-04-26 MED ORDER — LORATADINE 10 MG PO TABS
10.0000 mg | ORAL_TABLET | Freq: Every day | ORAL | Status: DC
  Administered 2022-04-26 – 2022-05-12 (×17): 10 mg via ORAL
  Filled 2022-04-26 (×17): qty 1

## 2022-04-26 NOTE — Progress Notes (Signed)
Echocardiogram 2D Echocardiogram has been performed.  Abigail Castillo M 04/26/2022, 3:25 PM

## 2022-04-26 NOTE — Progress Notes (Signed)
Mobility Specialist - Progress Note   04/26/22 1344  Mobility  Activity Ambulated with assistance in room  Level of Assistance Standby assist, set-up cues, supervision of patient - no hands on  Assistive Device Front wheel walker  Distance Ambulated (ft) 20 ft  Activity Response Tolerated fair  Mobility Referral Yes  $Mobility charge 1 Mobility   Pt received in bed and agreeable to mobility. Pt required 8L/min O2 to stay </= 92% while ambulating. Pt required x1 seated break d/t SOB and general weakness.Pt was left in chair with all needs met and RN notified.   Franki Monte  Mobility Specialist Please contact via [employer] or Rehab office at [phone]

## 2022-04-26 NOTE — Progress Notes (Signed)
Patient requesting a break from the BiPAP at this time. Will place patient on 4 Lpm [place] after her breathing treatment. RN made aware.

## 2022-04-26 NOTE — Progress Notes (Signed)
Patient placed back on BiPAP at this time. Patient resting comfortably.

## 2022-04-26 NOTE — Progress Notes (Signed)
Seen and examined this morning.  Patient feels much better today after being on BiPAP overnight Seen by pulmonary yesterday BNP D-dimer procalcitonin are negative and reassuring We will ambulate her today if resp status remains stable, then we will discharge her to facility

## 2022-04-26 NOTE — Progress Notes (Signed)
EKG reviewed. Sinus tach, LAD, no ischemic changes. Qtc 448m.  LJulian Hy DO 04/26/22 5:20 PM [facility]

## 2022-04-26 NOTE — Progress Notes (Addendum)
NAME:  Abigail Castillo, MRN:  [id number], DOB:  [birth year], LOS: 61 ADMISSION DATE:  04/09/2022, CONSULTATION DATE: April 10, 2022 REFERRING MD: Tyrell Antonio, CHIEF COMPLAINT: Dyspnea  History of Present Illness:  71 year old female with COPD, chronic respiratory failure with hypoxemia who follows with Dr. Lamonte Sakai presented for shortness of breath to the emergency department yesterday.  She says that last week she got a cold and she has been having allergies.  She had significant postnasal drip and sinus congestion.  She has had some cough but not much mucus production.  Her primary complaint yesterday was shortness of breath.  She denies leg swelling.  She has not had hemoptysis or chest pain.  In the emergency department she has been treated with bronchodilators, steroids, and required noninvasive mechanical ventilation.  The noninvasive mechanical ventilation was just stopped and she says she is feeling a bit better.  She is currently on 4 L of oxygen, she uses 2-3 at home.  She says that she quit smoking several years ago.    Pertinent  Medical History    has a past medical history of Acute on chronic respiratory failure with hypoxia ([place]) (07/05/2019), Anxiety, Blood transfusion, Blood transfusion without reported diagnosis, Breast cancer ([place]) (05/13/2021), Chronic mental illness, COPD (chronic obstructive pulmonary disease) ([place]), COVID-19 virus infection (07/06/2019), DEPRESSION (05/31/2009), Emphysema of lung ([place]), Esophageal stricture (03/14/2015), GERD (gastroesophageal reflux disease), Hepatic steatosis, History of home oxygen therapy, History of pneumonia, Osteoporosis, Oxygen dependent, Shortness of breath dyspnea, Tobacco abuse, Vitamin B 12 deficiency, and Vitamin D deficiency.   Significant Hospital Events: Including procedures, antibiotic start and stop dates in addition to other pertinent events   April 09, 2022 respiratory viral panel negative for flu and COVID November 2015  chest x-ray images independently reviewed showing emphysema, interstitial changes in bases 11/16 pulmonary consulted for dypsnea. Rec COPD tx. Prolonged inpatient course for acute on chronic hypoxia, O2 not weaning.  CT 11/20 to rule out PE, showed tracheobronchomalacia 12/1 PCCM re-consulted for ongoing oxygen requirement.   Interim History / Subjective:  [name] still complains of SOB. She walked for an O2 assessment this morning and required 8L.  Objective   Blood pressure 128/81, pulse (!) 106, temperature 98.2 F (36.8 C), temperature source Oral, resp. rate 18, height '5\' 2"'$  (1.575 m), weight 67.8 kg, SpO2 100 %.        Intake/Output Summary (Last 24 hours) at 04/26/2022 1406 Last data filed at 04/25/2022 2042 Gross per 24 hour  Intake --  Output 450 ml  Net -450 ml    Filed Weights   04/10/22 1242 04/23/22 0515 04/26/22 0318  Weight: 65 kg 71.4 kg 67.8 kg    Examination: General: chronically ill appearing woman sitting up in bed in NAD HEENT:  [place]/AT, eyes anicteric Neuro: awake, alert, moving all extremities. Globally weak. Answering questions appropriately. CV: S1S2, RRR PULM: tachypnea, more notable respiratory effort today without accessory muscle use. Faint expiratory wheezing bilaterally. GI:  soft, nondistended Extremities: no cyanosis, no significant edema Skin: warm, dry, no diffuse rashes  PCT <0.1 BNP 44 D-dimer <0.27  Resolved Hospital Problem list     Assessment & Plan:   COPD with acute exacerbation, seems recurrent after reducing steroid dose. Thankfully D-dimer does not suggest PE, low PCT and CXR suggest against pneumonia, and low BNP suggests against significant heart failure at this time.  Acute on chronic respiratory failure with hypoxemia -Increase steroids to prednisone '40mg'$  daily again. May be steroid-dependent. -checking Qtc on  EKG-- if ok will plan to add azithromycin three days per week for anti-inflammatory effect -adding singulair,  claritin daily with her report of an allergy history -not likely to benefit significantly from diuresis unless she develops physical exam findings of volume overload -con't bronchodilators -- Incruse switch to yupelri; con't brovana. -Adding duonebs Q4h -Needs to be walked before discharge; I am not convinced she is at her new baseline. Would request to have a forehead rather than finger probe if she is ambulating with a walker during the test to reduce confounders.  -has follow up with [facility] on 05/09/22 -bubble echo to evaluate for shunt  Deconditioning -recommend OP referral for PR -PT, OT  Tracheomalacia -con't nocturnal NIPPV -bronchodilators -prednisone   History of breast cancer -con't anastrazole  Julian Hy, DO 04/26/22 5:10 PM [facility]  For contact information, see Amion. If no response to pager, please call PCCM consult pager. After hours, 7PM- 7AM, please call Elink.

## 2022-04-26 NOTE — TOC Progression Note (Signed)
Transition of Care [facility]) - Progression Note    Patient Details  Name: BESSIE BOYTE MRN: [id number] Date of Birth: 11/17/1950  Transition of Care [facility]) CM/SW [place], LCSW Phone Number: 04/26/2022, 11:38 AM  Clinical Narrative:     CSW was alerted of possible DC today. CSW followed up with facility and had to leave message.  TOC team will continue to assist with discharge planning needs.   Expected Discharge Plan: [place] Barriers to Discharge: Barriers Resolved  Expected Discharge Plan and Services Expected Discharge Plan: [place]   Discharge Planning Services: CM Consult Post Acute Care Choice: Durable Medical Equipment, Home Health Living arrangements for the past 2 months: Mobile Home Expected Discharge Date: 04/25/22               DME Arranged: Nebulizer machine, Oxygen, Bipap DME Agency: [employer] Date DME Agency Contacted: 04/15/22 Time DME Agency Contacted: 1430 Representative spoke with at DME Agency: [place]: PT [place]: [place] Date [place]: 04/18/22 Time [place]: 47 Representative spoke with at [place]: [place] Determinants of Health ([place]) Interventions    Readmission Risk Interventions   No data to display

## 2022-04-27 DIAGNOSIS — J9621 Acute and chronic respiratory failure with hypoxia: Secondary | ICD-10-CM

## 2022-04-27 DIAGNOSIS — J441 Chronic obstructive pulmonary disease with (acute) exacerbation: Secondary | ICD-10-CM | POA: Diagnosis not present

## 2022-04-27 DIAGNOSIS — J9622 Acute and chronic respiratory failure with hypercapnia: Secondary | ICD-10-CM | POA: Diagnosis not present

## 2022-04-27 DIAGNOSIS — J398 Other specified diseases of upper respiratory tract: Secondary | ICD-10-CM

## 2022-04-27 LAB — PROCALCITONIN: Procalcitonin: <0.1 ng/mL

## 2022-04-27 NOTE — Progress Notes (Signed)
NAME:  Abigail Castillo, MRN:  [id number], DOB:  Mar 09, 1951, LOS: 18 ADMISSION DATE:  04/09/2022, CONSULTATION DATE: April 10, 2022 REFERRING MD: Abigail Castillo, CHIEF COMPLAINT: Dyspnea  History of Present Illness:  71 year old female with COPD, chronic respiratory failure with hypoxemia who follows with Abigail Castillo presented for shortness of breath to the emergency department yesterday.  She says that last week she got a cold and she has been having allergies.  She had significant postnasal drip and sinus congestion.  She has had some cough but not much mucus production.  Her primary complaint yesterday was shortness of breath.  She denies leg swelling.  She has not had hemoptysis or chest pain.  In the emergency department she has been treated with bronchodilators, steroids, and required noninvasive mechanical ventilation.  The noninvasive mechanical ventilation was just stopped and she says she is feeling a bit better.  She is currently on 4 L of oxygen, she uses 2-3 at home.  She says that she quit smoking several years ago.    Pertinent  Medical History    has a past medical history of Acute on chronic respiratory failure with hypoxia ([place]) (07/05/2019), Anxiety, Blood transfusion, Blood transfusion without reported diagnosis, Breast cancer ([place]) (05/13/2021), Chronic mental illness, COPD (chronic obstructive pulmonary disease) ([place]), COVID-19 virus infection (07/06/2019), DEPRESSION (05/31/2009), Emphysema of lung ([place]), Esophageal stricture (03/14/2015), GERD (gastroesophageal reflux disease), Hepatic steatosis, History of home oxygen therapy, History of pneumonia, Osteoporosis, Oxygen dependent, Shortness of breath dyspnea, Tobacco abuse, Vitamin B 12 deficiency, and Vitamin D deficiency.   Significant Hospital Events: Including procedures, antibiotic start and stop dates in addition to other pertinent events   April 09, 2022 respiratory viral panel negative for flu and COVID November 2015  chest x-ray images independently reviewed showing emphysema, interstitial changes in bases 11/16 pulmonary consulted for dypsnea. Rec COPD tx. Prolonged inpatient course for acute on chronic hypoxia, O2 not weaning.  CT 11/20 to rule out PE, showed tracheobronchomalacia 12/1 PCCM re-consulted for ongoing oxygen requirement.   Interim History / Subjective:  Breathing feels better when resting, she is short of breath now after walking.Abigail Castillo on 4L with saturations that dropped to 87% once she sat down afterwards--persistent stating needing to have a bowel movement.   Objective   Blood pressure 120/82, pulse (!) 109, temperature 97.9 F (36.6 C), temperature source Oral, resp. rate 16, height '5\' 2"'$  (1.575 m), weight 67.8 kg, SpO2 96 %.        Intake/Output Summary (Last 24 hours) at 04/27/2022 1632 Last data filed at 04/27/2022 1225 Gross per 24 hour  Intake 417 ml  Output 2100 ml  Net -1683 ml    Filed Weights   04/10/22 1242 04/23/22 0515 04/26/22 0318  Weight: 65 kg 71.4 kg 67.8 kg    Examination: General: Chronically ill-appearing woman sitting up in the edge of the bed in no acute distress HEENT: AT/AT, eyes anicteric  neuro: Awake, alert, answering questions appropriately. CV: S1-S2, regular rate and rhythm Castillo: Tachypneic, mildly increased work of breathing, no accessory muscle use.  No wheezing on exam today.  On 4 L nasal cannula. GI: Soft, nontender, nondistended Extremities: No edema Skin: Warm, dry, no diffuse rashes  PCT <0.1 x 3 BNP 44 D-dimer <0.27  Echo-negative, hyperdynamic LV, normal RV function, elevated PASP.  Resolved Hospital Problem list     Assessment & Plan:   COPD with acute exacerbation, seems recurrent after reducing steroid dose. Thankfully D-dimer does not suggest PE, low  PCT and CXR suggest against pneumonia, and low BNP suggests against significant heart failure at this time.  Acute on chronic respiratory failure with  hypoxemia -Continue increased dose of steroids-40 mg daily.  Anticipate she will be steroid-dependent moving forward. -Continue azithromycin 3 days/week-plan on this at discharge - Continue Singulair and Claritin.  If her sinus disease is not well-controlled, can add Flonase or azelastine. - Continue to yupelri & brovana.  Continue DuoNebs every 4 hours on top of the long-acting meds. -Anticipate she will be stable for discharge, 4 L within the next 1 to 2 days -has follow up with Abigail Castillo on 05/09/22  Deconditioning -Recommend outpatient follow-up for pulmonary rehab -PT, OT  Tracheomalacia -Continue nocturnal noninvasive positive pressure ventilation - Continue bronchodilators and steroids.   History of breast cancer -Continue anastrozole per primary  PCCM will be available as needed.  Please keep her on steroids until she follows up in the office.  Abigail Hy, DO 04/27/22 5:20 PM [facility]  For contact information, see Amion. If no response to pager, please call PCCM consult pager. After hours, 7PM- 7AM, please call [place].

## 2022-04-27 NOTE — Progress Notes (Signed)
PROGRESS NOTE Abigail Castillo  MLY:[id number] DOB: [birth year] DOA: 04/09/2022 PCP: [name], NP   Brief Narrative/Hospital Course: 71 year old with a history of COPD, chronic hypoxic respiratory failure on 2-3 L nasal cannula home oxygen support, moderate pulmonary hypertension, breast cancer status postlumpectomy and HRT, anxiety, and depression who presented with worsening shortness of breath for 2 to 3 days.  Patient was seen in the ED, admitted for acute exacerbation of severe COPD acute on chronic hypoxic respiratory failure needing BiPAP CT scan negative for PE found to have tracheobronchomalacia, seen by pulmonary. Respiratory status was improving but patient started to get more short of breath hypoxic during ambulation seen by pulmonary again placed on bedtime BiPAP, steroid increased and also started azithromycin for acute on chronic COPD exacerbation   Subjective: Patient seen and examined this morning reports she is feeling better overall.  Used BiPAP last night.  Overnight afebrile currently on 2 4 L nasal cannula  Assessment and Plan: Principal Problem:   COPD (chronic obstructive pulmonary disease) (HCC) Active Problems:   Depression   GERD (gastroesophageal reflux disease)   Acute on chronic respiratory failure with hypoxia (HCC)   Ductal carcinoma in situ (DCIS) of left breast   Hyponatremia   Bronchomalacia   Localized edema   COPD exacerbation (HCC)  Acute exacerbation of severe COPD Acute chronic hypoxic respiratory failure: Initially needed BiPAP, CTA negative for PE.Completed 5 days of doxycycline.Respiratory status was improving but patient started to get more short of breath hypoxic during ambulation seen by pulmonary again placed on bedtime BiPAP, steroid increased and also started azithromycin for acute on chronic COPD exacerbation.  Now on prednisone 40 mg, started on azithromycin 3 times weekly for anti-inflammatory, Claritin and Singulair, further  increased nebulizer and inhaler-appreciate pulmonary management.  D-dimer procalcitonin and BNP unremarkable/reassuring for other etiology of worsening dyspnea.  Continue empiric time and PRN BiPAP.  Tracheobronchomalacia:Noted on CT during this admission seen by pulmonary, plan is to continue on BiPAP at bedtime and as needed, daytime supplemental oxygen and outpatient pulmonary follow-up.  Home BiPAP has been arranged "She requires frequent durations of respiratory support and deteriorates quickly in the absence of noninvasive mechanical ventilation.  BiPAP alone has been considered but ruled out as insufficient.  Interruption or failure to provide NIMV would quickly lead to exacerbation of the patient's condition and result in hospitalization and probable harm to the patient.  Continued use of NIMV would allow [place] to avoid this.  Without use of NIMV there is significant risk of decline in the patient's health status specifically caused by poor ventilation and increasing CO2 retention which will ultimately deleteriously affect her health and likely lead to readmission"   Anxiety/depression doing well, continue her olanzapine and Ativan. Hypertension BP controlled, continue amlodipine 2.'5MG'$   History of breast cancer status post-lumpectomy:Continue anastrozole  DVT prophylaxis: enoxaparin (LOVENOX) injection 40 mg Start: 04/09/22 1215 Code Status:   Code Status: Full Code Family Communication: plan of care discussed with patient at bedside. Patient status is: Inpatient because of respiratory failure and dyspnea Level of care: Med-Surg   Dispo: The patient is from: Home            Anticipated disposition: To skilled nursing 52 S/P status stable on ambulation.  Discharge her over this weekend due to worsening respiratory status most with ambulation   Objective: Vitals last 24 hrs: Vitals:   04/26/22 2306 04/26/22 2311 04/27/22 0354 04/27/22 0755  BP: (!) 121/90  127/79 119/70  Pulse: 92 97 96  96   Resp: '20 20 20 16  '$ Temp: 97.9 F (36.6 C)  97.9 F (36.6 C) 97.9 F (36.6 C)  TempSrc: Oral  Oral Oral  SpO2: 99%  100% 100%  Weight:      Height:       Weight change:   Physical Examination: General exam: alert awake, oriented, pleasant, on nasal cannula oxygen, older than stated age HEENT:Oral mucosa moist, Ear/Nose WNL grossly Respiratory system: Bilaterally diminished breath sounds with expiratory wheezing diffuse,no use of accessory muscle Cardiovascular system: S1 & S2 +, No JVD. Gastrointestinal system: Abdomen soft,NT,ND, BS+ Nervous System: Alert, awake, moving extremities well, she follows commands. Extremities: LE edema neg,distal peripheral pulses palpable.  Skin: No rashes,no icterus. MSK: Normal muscle bulk,tone, power    Medications reviewed:  Scheduled Meds:  amLODipine  2.5 mg Oral Daily   anastrozole  1 mg Oral Daily   arformoterol  15 mcg Nebulization BID   azithromycin  500 mg Oral Once per day on Mon Wed Fri   enoxaparin (LOVENOX) injection  40 mg Subcutaneous Q24H   guaiFENesin  1,200 mg Oral BID   ipratropium-albuterol  3 mL Nebulization QID   lidocaine  1 patch Transdermal Daily   loratadine  10 mg Oral Daily   montelukast  10 mg Oral QHS   OLANZapine  7.5 mg Oral Daily   pantoprazole  40 mg Oral BID   polyethylene glycol  17 g Oral BID   predniSONE  40 mg Oral Q breakfast   revefenacin  175 mcg Nebulization Daily   senna-docusate  1 tablet Oral BID  Continuous Infusions:   Diet Order             Diet regular Room service appropriate? Yes with Assist; Fluid consistency: Thin  Diet effective now                   Intake/Output Summary (Last 24 hours) at 04/27/2022 1104 Last data filed at 04/27/2022 0057 Gross per 24 hour  Intake 417 ml  Output 1700 ml  Net -1283 ml     Net IO Since Admission: -9,029 mL [04/27/22 1104]  Wt Readings from Last 3 Encounters:  04/26/22 67.8 kg  02/21/22 65.8 kg  12/04/21 65.7 kg     Unresulted  Labs (From admission, onward)    None     Data Reviewed: I have personally reviewed following labs and imaging studies CBC: Recent Labs  Lab 04/25/22 1020  WBC 16.3*  HGB 13.3  HCT 39.7  MCV 94.7  PLT 712   Basic Metabolic Panel: Recent Labs  Lab 04/25/22 1020  NA 137  K 3.9  CL 96*  CO2 33*  GLUCOSE 130*  BUN 13  CREATININE 0.66  CALCIUM 9.0   CBG: Recent Labs  Lab 04/21/22 1305 04/21/22 1705  GLUCAP 118* 125*   No results found for this or any previous visit (from the past 240 hour(s)).  Antimicrobials: Anti-infectives (From admission, onward)    Start     Dose/Rate Route Frequency Ordered Stop   04/26/22 1715  azithromycin (ZITHROMAX) tablet 500 mg        500 mg Oral Once per day on Mon Wed Fri 04/26/22 1709     04/09/22 1215  doxycycline (VIBRA-TABS) tablet 100 mg        100 mg Oral Every 12 hours 04/09/22 1201 04/13/22 2121     Culture/Microbiology    Component Value Date/Time   SDES BLOOD LEFT ANTECUBITAL 07/05/2019 1925  SPECREQUEST  07/05/2019 1925    BOTTLES DRAWN AEROBIC AND ANAEROBIC Blood Culture adequate volume   CULT  07/05/2019 1925    NO GROWTH 5 DAYS Performed at [facility], [place] [address], [place], [place] [address]    REPTSTATUS 07/10/2019 FINAL 07/05/2019 1925  Radiology Studies: ECHOCARDIOGRAM COMPLETE BUBBLE STUDY  Result Date: 04/26/2022    ECHOCARDIOGRAM REPORT   Patient Name:   Fronia R Hernan Date of Exam: 04/26/2022 Medical Rec #:  [id number]      Height:       62.0 in Accession #:    [id number]     Weight:       149.5 lb Date of Birth:  [birth year]     BSA:          1.689 m Patient Age:    18 years       BP:           128/81 mmHg Patient Gender: F              HR:           106 bpm. Exam Location:  Inpatient Procedure: 2D Echo, Cardiac Doppler, Color Doppler and Intracardiac            Opacification Agent STAT ECHO Indications:    Hypoxia [300808]  History:        Patient has no prior history of Echocardiogram  examinations.                 COPD, Signs/Symptoms:Shortness of Breath; Risk Factors:Current                 Smoker. Emphysema of lung. GERD.  Sonographer:    Darlina Sicilian RDCS Referring Phys: [id number] Julian Hy  Sonographer Comments: Echo performed with patient sitting upright in chair. IMPRESSIONS  1. Left ventricular ejection fraction, by estimation, is 70 to 75%. The left ventricle has hyperdynamic function. The left ventricle has no regional wall motion abnormalities. There is mild asymmetric left ventricular hypertrophy of the basal-septal segment. Left ventricular diastolic parameters are consistent with Grade I diastolic dysfunction (impaired relaxation).  2. Right ventricular systolic function is normal. The right ventricular size is normal. There is mildly elevated pulmonary artery systolic pressure. The estimated right ventricular systolic pressure is 36.6 mmHg.  3. The mitral valve is grossly normal. No evidence of mitral valve regurgitation. No evidence of mitral stenosis.  4. The aortic valve is grossly normal. Aortic valve regurgitation is not visualized. No aortic stenosis is present.  5. The inferior vena cava is normal in size with greater than 50% respiratory variability, suggesting right atrial pressure of 3 mmHg.  6. Agitated saline contrast bubble study was negative, with no evidence of any interatrial shunt. FINDINGS  Left Ventricle: Left ventricular ejection fraction, by estimation, is 70 to 75%. The left ventricle has hyperdynamic function. The left ventricle has no regional wall motion abnormalities. The left ventricular internal cavity size was small. There is mild asymmetric left ventricular hypertrophy of the basal-septal segment. Left ventricular diastolic parameters are consistent with Grade I diastolic dysfunction (impaired relaxation). Right Ventricle: The right ventricular size is normal. No increase in right ventricular wall thickness. Right ventricular systolic function is  normal. There is mildly elevated pulmonary artery systolic pressure. The tricuspid regurgitant velocity is 3.05  m/s, and with an assumed right atrial pressure of 3 mmHg, the estimated right ventricular systolic pressure is 29.4 mmHg. Left Atrium: Left atrial size was normal in size.  Right Atrium: Right atrial size was normal in size. Pericardium: There is no evidence of pericardial effusion. Presence of epicardial fat layer. Mitral Valve: The mitral valve is grossly normal. No evidence of mitral valve regurgitation. No evidence of mitral valve stenosis. Tricuspid Valve: The tricuspid valve is grossly normal. Tricuspid valve regurgitation is trivial. No evidence of tricuspid stenosis. Aortic Valve: The aortic valve is grossly normal. Aortic valve regurgitation is not visualized. No aortic stenosis is present. Pulmonic Valve: The pulmonic valve was not well visualized. Pulmonic valve regurgitation is not visualized. No evidence of pulmonic stenosis. Aorta: The aortic root is normal in size and structure. Venous: The inferior vena cava is normal in size with greater than 50% respiratory variability, suggesting right atrial pressure of 3 mmHg. IAS/Shunts: No atrial level shunt detected by color flow Doppler. Agitated saline contrast was given intravenously to evaluate for intracardiac shunting. Agitated saline contrast bubble study was negative, with no evidence of any interatrial shunt.  LEFT VENTRICLE PLAX 2D LVIDd:         3.60 cm   Diastology LVIDs:         1.80 cm   LV e' medial:    9.19 cm/s LV PW:         0.70 cm   LV E/e' medial:  5.1 LV IVS:        1.10 cm   LV e' lateral:   11.00 cm/s LVOT diam:     1.80 cm   LV E/e' lateral: 4.3 LV SV:         49 LV SV Index:   29 LVOT Area:     2.54 cm  RIGHT VENTRICLE RV Basal diam:  3.40 cm RV Mid diam:    2.60 cm LEFT ATRIUM             Index LA diam:        2.70 cm 1.60 cm/m LA Vol (A2C):   15.5 ml 9.18 ml/m LA Vol (A4C):   10.8 ml 6.39 ml/m LA Biplane Vol: 13.2 ml  7.81 ml/m  AORTIC VALVE LVOT Vmax:   122.00 cm/s LVOT Vmean:  98.200 cm/s LVOT VTI:    0.193 m  AORTA Ao Root diam: 2.80 cm MITRAL VALVE               TRICUSPID VALVE MV Area (PHT): 2.42 cm    TR Peak grad:   37.2 mmHg MV Decel Time: 314 msec    TR Vmax:        305.00 cm/s MV E velocity: 46.90 cm/s MV A velocity: 81.80 cm/s  SHUNTS MV E/A ratio:  0.57        Systemic VTI:  0.19 m                            Systemic Diam: 1.80 cm Candee Furbish MD Electronically signed by Candee Furbish MD Signature Date/Time: 04/26/2022/5:13:38 PM    Final     LOS: 18 days  Antonieta Pert, MD [facility]  04/27/2022, 11:04 AM

## 2022-04-27 NOTE — TOC Progression Note (Signed)
Transition of Care [facility]) - Progression Note    Patient Details  Name: Abigail Castillo MRN: [id number] Date of Birth: Sep 18, 1950  Transition of Care [facility]) CM/SW [place], [place] Phone Number: 04/27/2022, 1:50 PM  Clinical Narrative:     CSW LVM for [place] with [facility]. CSW awaiting callback. Patient has SNF bed at [facility]. Insurance authorization has been approved. CSW will continue to follow and assist with patients dc planning needs.  Expected Discharge Plan: [place] Barriers to Discharge: Barriers Resolved  Expected Discharge Plan and Services Expected Discharge Plan: [place]   Discharge Planning Services: CM Consult Post Acute Care Choice: Durable Medical Equipment, Home Health Living arrangements for the past 2 months: Mobile Home Expected Discharge Date: 04/25/22               DME Arranged: Nebulizer machine, Oxygen, Bipap DME Agency: [employer] Date DME Agency Contacted: 04/15/22 Time DME Agency Contacted: 1430 Representative spoke with at DME Agency: [place]: PT [place]: [place] Date [place]: 04/18/22 Time [place]: 69 Representative spoke with at [place]: [place] Determinants of Health ([place]) Interventions    Readmission Risk Interventions     No data to display

## 2022-04-27 NOTE — Progress Notes (Signed)
Pt asked to be taken off her BiPap. Put back on 4 L O2.  Will continue to monitor.  Lupita Dawn, RN

## 2022-04-28 ENCOUNTER — Other Ambulatory Visit: Payer: Self-pay | Admitting: Family

## 2022-04-28 DIAGNOSIS — J441 Chronic obstructive pulmonary disease with (acute) exacerbation: Secondary | ICD-10-CM | POA: Diagnosis not present

## 2022-04-28 NOTE — Progress Notes (Signed)
PROGRESS NOTE Abigail Castillo  KVQ:[id number] DOB: 05/04/1951 DOA: 04/09/2022 PCP: [name], NP   Brief Narrative/Hospital Course: 71 year old with a history of COPD, chronic hypoxic respiratory failure on 2-3 L nasal cannula home oxygen support, moderate pulmonary hypertension, breast cancer status postlumpectomy and HRT, anxiety, and depression who presented with worsening shortness of breath for 2 to 3 days.  Patient was seen in the ED, admitted for acute exacerbation of severe COPD acute on chronic hypoxic respiratory failure needing BiPAP CT scan negative for PE found to have tracheobronchomalacia, seen by pulmonary. Seen by pulmonary, aggressively treated with bronchodilators systemic steroids for acute exacerbation of COPD acute on chronic hypoxia and tracheobronchomalacia. Pulmonary felt she will benefit with BiPAP and planning for home BiPAP. She had worsening hypoxia on ambulation discharge held and pulmonary engaged placed on azithromycin 3X WEEKLY, increased dose of steroid to 40 mg planning for long-term-until seen by pulmonary ( office appointment on 12/15), added Singulair Claritin with instruction to monitor her sinus disease for which she can start on Flonase and azelastine if needed, to continue with  yupelri & brovana.  Continue DuoNebs every 4 hours on top of the long-acting meds.  Once stable respiratory status planning for SNF Respiratory status was improving but patient started to get more short of breath hypoxic during ambulation seen by pulmonary again placed on bedtime BiPAP, steroid increased and also started azithromycin for acute on chronic COPD exacerbation   Subjective: Seen and examined this morning patient was somewhat short of breath today Although on 4 L oxygen Nursing reports at times she drops 89% even at rest and 4 L. Overnight she has been afebrile  Assessment and Plan: Principal Problem:   COPD (chronic obstructive pulmonary disease)  (HCC) Active Problems:   Depression   GERD (gastroesophageal reflux disease)   Acute on chronic respiratory failure with hypoxia (HCC)   Ductal carcinoma in situ (DCIS) of left breast   Hyponatremia   Bronchomalacia   Localized edema   COPD exacerbation (HCC)   Tracheobronchomalacia   Acute on chronic respiratory failure with hypoxia and hypercapnia (HCC)  Acute exacerbation of severe COPD Acute chronic hypoxic respiratory failure: Initially needed BiPAP, CTA negative for PE.Completed 5 days of doxycycline.Respiratory status was improving but patient started to get more short of breath hypoxic during ambulation seen by pulmonary again placed on bedtime BiPAP, steroid increased and also started azithromycin for acute on chronic COPD exacerbation> continue prednisone 40 mg with plan to continue long-term until seen at pulmonary office, continue azithromycin azithromycin 3 times weekly for anti-inflammatory, Claritin and Singulair, continue on increased nebulizer and inhaler-appreciate pulmonary management.  Somewhat short of breath today, continue to mobilize and see how she does.  D-dimer procalcitonin and BNP unremarkable/reassuring for other etiology of worsening dyspnea. Continue empiric time and PRN BiPAP.  Tracheobronchomalacia:Noted on CT during this admission seen by pulmonary, plan is to continue on BiPAP at bedtime and as needed, daytime supplemental oxygen and outpatient pulmonary follow-up.  Home BiPAP has been arranged "She requires frequent durations of respiratory support and deteriorates quickly in the absence of noninvasive mechanical ventilation.  BiPAP alone has been considered but ruled out as insufficient.  Interruption or failure to provide NIMV would quickly lead to exacerbation of the patient's condition and result in hospitalization and probable harm to the patient.  Continued use of NIMV would allow [place] to avoid this.  Without use of NIMV there is significant risk of decline  in the patient's health status specifically caused  by poor ventilation and increasing CO2 retention which will ultimately deleteriously affect her health and likely lead to readmission"   Anxiety/depression doing well, somewhat anxious at times.  Continue her olanzapine and Ativan. Hypertension BP stable, continue amlodipine 2.'5MG'$   History of breast cancer status post-lumpectomy:Continue anastrozole Goals of care currently full code patient at risk of decompensation and readmission, will involve palliative care.  DVT prophylaxis: enoxaparin (LOVENOX) injection 40 mg Start: 04/09/22 1215 Code Status:   Code Status: Full Code Family Communication: plan of care discussed with patient at bedside. Patient status is: Inpatient because of respiratory failure and dyspnea Level of care: Med-Surg   Dispo: The patient is from: Home            Anticipated disposition: To skilled nursing once respiratory status is stable hopefully next 1 to 2-day    Objective: Vitals last 24 hrs: Vitals:   04/28/22 0912 04/28/22 1104 04/28/22 1105 04/28/22 1159  BP:  119/79 119/79   Pulse: (!) 109 (!) 115 (!) 106   Resp: '18 18 19   '$ Temp:  (!) 97.5 F (36.4 C) (!) 97.5 F (36.4 C)   TempSrc:  Oral    SpO2: 96% 97% 93% 98%  Weight:      Height:       Weight change:   Physical Examination: General exam: AAox3, anxious weak,older appearing HEENT:Oral mucosa moist, Ear/Nose WNL grossly, dentition normal. Respiratory system: bilaterally diminished BS with expiratory wheezing, no use of accessory muscle Cardiovascular system: S1 & S2 +, regular rate. Gastrointestinal system: Abdomen soft, NT,ND,BS+ Nervous System:Alert, awake, moving extremities and grossly nonfocal Extremities: LE ankle edema neg, lower extremities warm Skin: No rashes,no icterus. MSK: Normal muscle bulk,tone, power   Medications reviewed:  Scheduled Meds:  amLODipine  2.5 mg Oral Daily   anastrozole  1 mg Oral Daily   arformoterol  15  mcg Nebulization BID   azithromycin  500 mg Oral Once per day on Mon Wed Fri   enoxaparin (LOVENOX) injection  40 mg Subcutaneous Q24H   guaiFENesin  1,200 mg Oral BID   ipratropium-albuterol  3 mL Nebulization QID   lidocaine  1 patch Transdermal Daily   loratadine  10 mg Oral Daily   montelukast  10 mg Oral QHS   OLANZapine  7.5 mg Oral Daily   pantoprazole  40 mg Oral BID   polyethylene glycol  17 g Oral BID   predniSONE  40 mg Oral Q breakfast   revefenacin  175 mcg Nebulization Daily   senna-docusate  1 tablet Oral BID  Continuous Infusions:   Diet Order             Diet regular Room service appropriate? Yes with Assist; Fluid consistency: Thin  Diet effective now                   Intake/Output Summary (Last 24 hours) at 04/28/2022 1410 Last data filed at 04/27/2022 2139 Gross per 24 hour  Intake 200 ml  Output 400 ml  Net -200 ml     Net IO Since Admission: -9,629 mL [04/28/22 1410]  Wt Readings from Last 3 Encounters:  04/26/22 67.8 kg  02/21/22 65.8 kg  12/04/21 65.7 kg     Unresulted Labs (From admission, onward)    None     Data Reviewed: I have personally reviewed following labs and imaging studies CBC: Recent Labs  Lab 04/25/22 1020  WBC 16.3*  HGB 13.3  HCT 39.7  MCV 94.7  PLT  893   Basic Metabolic Panel: Recent Labs  Lab 04/25/22 1020  NA 137  K 3.9  CL 96*  CO2 33*  GLUCOSE 130*  BUN 13  CREATININE 0.66  CALCIUM 9.0   CBG: Recent Labs  Lab 04/21/22 1705  GLUCAP 125*   No results found for this or any previous visit (from the past 240 hour(s)).  Antimicrobials: Anti-infectives (From admission, onward)    Start     Dose/Rate Route Frequency Ordered Stop   04/26/22 1715  azithromycin (ZITHROMAX) tablet 500 mg        500 mg Oral Once per day on Mon Wed Fri 04/26/22 1709     04/09/22 1215  doxycycline (VIBRA-TABS) tablet 100 mg        100 mg Oral Every 12 hours 04/09/22 1201 04/13/22 2121     Culture/Microbiology     Component Value Date/Time   SDES BLOOD LEFT ANTECUBITAL 07/05/2019 1925   SPECREQUEST  07/05/2019 1925    BOTTLES DRAWN AEROBIC AND ANAEROBIC Blood Culture adequate volume   CULT  07/05/2019 1925    NO GROWTH 5 DAYS Performed at [facility], [place] [address], [place], [place] [address]    REPTSTATUS 07/10/2019 FINAL 07/05/2019 1925  Radiology Studies: ECHOCARDIOGRAM COMPLETE BUBBLE STUDY  Result Date: 04/26/2022    ECHOCARDIOGRAM REPORT   Patient Name:   Bonnee R Ottaway Date of Exam: 04/26/2022 Medical Rec #:  [id number]      Height:       62.0 in Accession #:    [id number]     Weight:       149.5 lb Date of Birth:  [birth year]     BSA:          1.689 m Patient Age:    4 years       BP:           128/81 mmHg Patient Gender: F              HR:           106 bpm. Exam Location:  Inpatient Procedure: 2D Echo, Cardiac Doppler, Color Doppler and Intracardiac            Opacification Agent STAT ECHO Indications:    Hypoxia [300808]  History:        Patient has no prior history of Echocardiogram examinations.                 COPD, Signs/Symptoms:Shortness of Breath; Risk Factors:Current                 Smoker. Emphysema of lung. GERD.  Sonographer:    Darlina Sicilian RDCS Referring Phys: [id number] Julian Hy  Sonographer Comments: Echo performed with patient sitting upright in chair. IMPRESSIONS  1. Left ventricular ejection fraction, by estimation, is 70 to 75%. The left ventricle has hyperdynamic function. The left ventricle has no regional wall motion abnormalities. There is mild asymmetric left ventricular hypertrophy of the basal-septal segment. Left ventricular diastolic parameters are consistent with Grade I diastolic dysfunction (impaired relaxation).  2. Right ventricular systolic function is normal. The right ventricular size is normal. There is mildly elevated pulmonary artery systolic pressure. The estimated right ventricular systolic pressure is 08.6 mmHg.  3. The mitral valve is grossly  normal. No evidence of mitral valve regurgitation. No evidence of mitral stenosis.  4. The aortic valve is grossly normal. Aortic valve regurgitation is not visualized. No aortic stenosis is present.  5. The  inferior vena cava is normal in size with greater than 50% respiratory variability, suggesting right atrial pressure of 3 mmHg.  6. Agitated saline contrast bubble study was negative, with no evidence of any interatrial shunt. FINDINGS  Left Ventricle: Left ventricular ejection fraction, by estimation, is 70 to 75%. The left ventricle has hyperdynamic function. The left ventricle has no regional wall motion abnormalities. The left ventricular internal cavity size was small. There is mild asymmetric left ventricular hypertrophy of the basal-septal segment. Left ventricular diastolic parameters are consistent with Grade I diastolic dysfunction (impaired relaxation). Right Ventricle: The right ventricular size is normal. No increase in right ventricular wall thickness. Right ventricular systolic function is normal. There is mildly elevated pulmonary artery systolic pressure. The tricuspid regurgitant velocity is 3.05  m/s, and with an assumed right atrial pressure of 3 mmHg, the estimated right ventricular systolic pressure is 19.1 mmHg. Left Atrium: Left atrial size was normal in size. Right Atrium: Right atrial size was normal in size. Pericardium: There is no evidence of pericardial effusion. Presence of epicardial fat layer. Mitral Valve: The mitral valve is grossly normal. No evidence of mitral valve regurgitation. No evidence of mitral valve stenosis. Tricuspid Valve: The tricuspid valve is grossly normal. Tricuspid valve regurgitation is trivial. No evidence of tricuspid stenosis. Aortic Valve: The aortic valve is grossly normal. Aortic valve regurgitation is not visualized. No aortic stenosis is present. Pulmonic Valve: The pulmonic valve was not well visualized. Pulmonic valve regurgitation is not  visualized. No evidence of pulmonic stenosis. Aorta: The aortic root is normal in size and structure. Venous: The inferior vena cava is normal in size with greater than 50% respiratory variability, suggesting right atrial pressure of 3 mmHg. IAS/Shunts: No atrial level shunt detected by color flow Doppler. Agitated saline contrast was given intravenously to evaluate for intracardiac shunting. Agitated saline contrast bubble study was negative, with no evidence of any interatrial shunt.  LEFT VENTRICLE PLAX 2D LVIDd:         3.60 cm   Diastology LVIDs:         1.80 cm   LV e' medial:    9.19 cm/s LV PW:         0.70 cm   LV E/e' medial:  5.1 LV IVS:        1.10 cm   LV e' lateral:   11.00 cm/s LVOT diam:     1.80 cm   LV E/e' lateral: 4.3 LV SV:         49 LV SV Index:   29 LVOT Area:     2.54 cm  RIGHT VENTRICLE RV Basal diam:  3.40 cm RV Mid diam:    2.60 cm LEFT ATRIUM             Index LA diam:        2.70 cm 1.60 cm/m LA Vol (A2C):   15.5 ml 9.18 ml/m LA Vol (A4C):   10.8 ml 6.39 ml/m LA Biplane Vol: 13.2 ml 7.81 ml/m  AORTIC VALVE LVOT Vmax:   122.00 cm/s LVOT Vmean:  98.200 cm/s LVOT VTI:    0.193 m  AORTA Ao Root diam: 2.80 cm MITRAL VALVE               TRICUSPID VALVE MV Area (PHT): 2.42 cm    TR Peak grad:   37.2 mmHg MV Decel Time: 314 msec    TR Vmax:        305.00 cm/s MV E velocity:  46.90 cm/s MV A velocity: 81.80 cm/s  SHUNTS MV E/A ratio:  0.57        Systemic VTI:  0.19 m                            Systemic Diam: 1.80 cm Candee Furbish MD Electronically signed by Candee Furbish MD Signature Date/Time: 04/26/2022/5:13:38 PM    Final     LOS: 81 days  Antonieta Pert, MD [facility]  04/28/2022, 2:10 PM

## 2022-04-28 NOTE — Progress Notes (Signed)
Mobility Specialist Progress Note:   04/28/22 1008  Mobility  Activity Ambulated with assistance in room  Level of Assistance Standby assist, set-up cues, supervision of patient - no hands on  Distance Ambulated (ft) 10 ft  Activity Response Tolerated well  $Mobility charge 1 Mobility   Pt received in bed willing to participate in mobility. No complaints of pain. Left in chair with call bell in reach and all needs met.   Gareth Eagle Dahmir Epperly Mobility Specialist Please contact via [employer] or  Rehab Office at [phone]

## 2022-04-28 NOTE — Progress Notes (Signed)
Mobility Specialist Progress Note:   04/28/22 1450  Mobility  Activity Ambulated with assistance in room  Level of Assistance Standby assist, set-up cues, supervision of patient - no hands on  Assistive Device Front wheel walker  Distance Ambulated (ft) 32 ft  Activity Response Tolerated well  $Mobility charge 1 Mobility   Pt received in chair. Pt required 8 L O2 to remain above 88% during ambulation. Encouraged Pursed lip breathing, IS, and flutter valve use. Left in bed with call bell in reach and all needs met.   Gareth Eagle Mick Tanguma Mobility Specialist Please contact via [employer] or  Rehab Office at [phone]

## 2022-04-28 NOTE — Progress Notes (Signed)
Provided pt IS and educated pt on IS.  Pt verbalized understanding and demonstrating it correctly. RN encouraged pt using IS at least 10 times every hour.  Lavenia Atlas, RN

## 2022-04-29 ENCOUNTER — Inpatient Hospital Stay (HOSPITAL_COMMUNITY): Payer: Medicare Other

## 2022-04-29 ENCOUNTER — Ambulatory Visit: Payer: Medicare Other | Admitting: Family

## 2022-04-29 DIAGNOSIS — Z7189 Other specified counseling: Secondary | ICD-10-CM

## 2022-04-29 DIAGNOSIS — Z515 Encounter for palliative care: Secondary | ICD-10-CM | POA: Diagnosis not present

## 2022-04-29 DIAGNOSIS — J9621 Acute and chronic respiratory failure with hypoxia: Secondary | ICD-10-CM | POA: Diagnosis not present

## 2022-04-29 DIAGNOSIS — J398 Other specified diseases of upper respiratory tract: Secondary | ICD-10-CM | POA: Diagnosis not present

## 2022-04-29 DIAGNOSIS — J9622 Acute and chronic respiratory failure with hypercapnia: Secondary | ICD-10-CM | POA: Diagnosis not present

## 2022-04-29 DIAGNOSIS — J441 Chronic obstructive pulmonary disease with (acute) exacerbation: Secondary | ICD-10-CM | POA: Diagnosis not present

## 2022-04-29 LAB — CBC
HCT: 39.9 % (ref 36.0–46.0)
Hemoglobin: 12.9 g/dL (ref 12.0–15.0)
MCH: 31.4 pg (ref 26.0–34.0)
MCHC: 32.3 g/dL (ref 30.0–36.0)
MCV: 97.1 fL (ref 80.0–100.0)
Platelets: 196 10*3/uL (ref 150–400)
RBC: 4.11 MIL/uL (ref 3.87–5.11)
RDW: 13.5 % (ref 11.5–15.5)
WBC: 12.3 10*3/uL — ABNORMAL HIGH (ref 4.0–10.5)
nRBC: 0 % (ref 0.0–0.2)

## 2022-04-29 LAB — BASIC METABOLIC PANEL
Anion gap: 10 (ref 5–15)
BUN: 19 mg/dL (ref 8–23)
CO2: 30 mmol/L (ref 22–32)
Calcium: 8.8 mg/dL — ABNORMAL LOW (ref 8.9–10.3)
Chloride: 96 mmol/L — ABNORMAL LOW (ref 98–111)
Creatinine, Ser: 0.88 mg/dL (ref 0.44–1.00)
GFR, Estimated: >60 mL/min (ref >60)
Glucose, Bld: 225 mg/dL — ABNORMAL HIGH (ref 70–99)
Potassium: 4.6 mmol/L (ref 3.5–5.1)
Sodium: 136 mmol/L (ref 135–145)

## 2022-04-29 MED ORDER — PREDNISONE 20 MG PO TABS: 30.0000 mg | ORAL_TABLET | Freq: Every day | ORAL | Status: DC

## 2022-04-29 MED ORDER — PREDNISONE 5 MG PO TABS: 15.0000 mg | ORAL_TABLET | Freq: Every day | ORAL | Status: DC

## 2022-04-29 MED ORDER — PREDNISONE 20 MG PO TABS
40.0000 mg | ORAL_TABLET | Freq: Every day | ORAL | Status: DC
  Administered 2022-04-30 – 2022-05-05 (×6): 40 mg via ORAL
  Filled 2022-04-29 (×6): qty 2

## 2022-04-29 NOTE — Progress Notes (Signed)
PROGRESS NOTE Abigail Castillo  SFK:[id number] DOB: 02/15/1951 DOA: 04/09/2022 PCP: [name], NP   Brief Narrative/Hospital Course: 71 year old with a history of COPD, chronic hypoxic respiratory failure on 2-3 L nasal cannula home oxygen support, moderate pulmonary hypertension, breast cancer status postlumpectomy and HRT, anxiety, and depression who presented with worsening shortness of breath for 2 to 3 days.  Patient was seen in the ED, admitted for acute exacerbation of severe COPD acute on chronic hypoxic respiratory failure needing BiPAP CT scan negative for PE found to have tracheobronchomalacia, seen by pulmonary. Seen by pulmonary, aggressively treated with bronchodilators systemic steroids for acute exacerbation of COPD acute on chronic hypoxia and tracheobronchomalacia. Pulmonary felt she will benefit with BiPAP and planning for home BiPAP. She had worsening hypoxia on ambulation discharge held and pulmonary engaged placed on azithromycin 3X WEEKLY, increased dose of steroid to 40 mg planning for long-term-until seen by pulmonary ( office appointment on 12/15), added Singulair Claritin with instruction to monitor her sinus disease for which she can start on Flonase and azelastine if needed, to continue with  yupelri & brovana.  Continue DuoNebs every 4 hours on top of the long-acting meds.  Once stable respiratory status planning for SNF Respiratory status was improving but patient started to get more short of breath hypoxic during ambulation seen by pulmonary again placed on bedtime BiPAP, steroid increased and also started azithromycin for acute on chronic COPD exacerbation  12/4 evening patient has been needing 8 L HFNC  Subjective: Seen and examined  Since yesterday evening she has been needing high flow nasal cannula at 8 L.   Used BiPAP overnight. Mildly tachycardic  Repeat labs ordered  Complaints of some shortness of breath Labs pending  Assessment and  Plan: Principal Problem:   COPD (chronic obstructive pulmonary disease) (HCC) Active Problems:   Depression   GERD (gastroesophageal reflux disease)   Acute on chronic respiratory failure with hypoxia (HCC)   Ductal carcinoma in situ (DCIS) of left breast   Hyponatremia   Bronchomalacia   Localized edema   COPD exacerbation (HCC)   Tracheobronchomalacia   Acute on chronic respiratory failure with hypoxia and hypercapnia (HCC)  Acute exacerbation of severe COPD Acute chronic hypoxic respiratory failure: Initially needed BiPAP, CTA negative for PE.Completed 5 days of doxycycline.Respiratory status was improving but patient started to get more short of breath hypoxic during ambulation seen by pulmonary again: obtained D-dimer procalcitonin and BNP unremarkable/reassuring for other etiology of worsening dyspnea>placed on bedtime BiPAP (with plan to cont bedtime at home),steroid increased and also started azithromycin for acute on chronic COPD exacerbation>continue prednisone 40 mg with plan to continue long-term until seen at pulmonary office, continue azithromycin azithromycin 3 times weekly for anti-inflammatory,Claritin and Singulair, continue on increased nebulizer and inhaler-appreciate pulmonary management.Since 12/4 pm she has been needing high flow nasal cannula at 8 L.PCCMM had signed off, I called this morning to reengage palliative care has been consulted.  Tracheobronchomalacia:Noted on CT during this admission seen by pulmonary, plan is to continue on BiPAP at bedtime and as needed, daytime supplemental oxygen and outpatient pulmonary follow-up.  Home BiPAP has been arranged "She requires frequent durations of respiratory support and deteriorates quickly in the absence of noninvasive mechanical ventilation.  BiPAP alone has been considered but ruled out as insufficient.  Interruption or failure to provide NIMV would quickly lead to exacerbation of the patient's condition and result in  hospitalization and probable harm to the patient.  Continued use of NIMV would allow  [place] to avoid this.  Without use of NIMV there is significant risk of decline in the patient's health status specifically caused by poor ventilation and increasing CO2 retention which will ultimately deleteriously affect her health and likely lead to readmission"   Anxiety/depression mildly anxious, somewhat anxious at times.Continue her olanzapine and Ativan. Hypertension BP stable, continue amlodipine 2.'5MG'$   History of breast cancer status post-lumpectomy:Continue anastrozole Goals of care currently full code patient at risk of decompensation and readmission, PMT has been consulted overall prognosis does not appear bright  DVT prophylaxis: enoxaparin (LOVENOX) injection 40 mg Start: 04/09/22 1215 Code Status:   Code Status: Full Code Family Communication: plan of care discussed with patient at bedside.  I had called patient's daughter previously. Called again 12/5 and she is aware. Patient status is: Inpatient because of respiratory failure and dyspnea Level of care: Med-Surg  Dispo:The patient is from: Home           Anticipated disposition: To skilled nursing once respiratory status is stable   Objective: Vitals last 24 hrs: Vitals:   04/29/22 0736 04/29/22 0813 04/29/22 1116 04/29/22 1315  BP:  105/83  101/71  Pulse:  (!) 110  (!) 111  Resp:  18  20  Temp:  97.6 F (36.4 C)  99.3 F (37.4 C)  TempSrc:  Oral  Oral  SpO2: 98% 98% 100% 97%  Weight:      Height:       Weight change:   Physical Examination: General exam: alert awake, oriented, anxious and short of breath HEENT:Oral mucosa moist, Ear/Nose WNL grossly Respiratory system: Bilaterally diminished BS, expiratory wheezing Cardiovascular system: S1 & S2 +, No JVD. Gastrointestinal system: Abdomen soft,NT,ND, BS+ Nervous System: Alert, awake, moving extremities, follows commands. Extremities: LE edema neg,distal peripheral pulses  palpable.  Skin: No rashes,no icterus. MSK: Normal muscle bulk,tone, power  Medications reviewed:  Scheduled Meds:  amLODipine  2.5 mg Oral Daily   anastrozole  1 mg Oral Daily   arformoterol  15 mcg Nebulization BID   azithromycin  500 mg Oral Once per day on Mon Wed Fri   enoxaparin (LOVENOX) injection  40 mg Subcutaneous Q24H   guaiFENesin  1,200 mg Oral BID   ipratropium-albuterol  3 mL Nebulization QID   lidocaine  1 patch Transdermal Daily   loratadine  10 mg Oral Daily   montelukast  10 mg Oral QHS   OLANZapine  7.5 mg Oral Daily   pantoprazole  40 mg Oral BID   polyethylene glycol  17 g Oral BID   [START ON 04/30/2022] predniSONE  40 mg Oral Q breakfast   Followed by   Derrill Memo ON 05/07/2022] predniSONE  30 mg Oral Q breakfast   Followed by   Derrill Memo ON 05/14/2022] predniSONE  15 mg Oral Q breakfast   revefenacin  175 mcg Nebulization Daily   senna-docusate  1 tablet Oral BID  Continuous Infusions:   Diet Order             Diet regular Room service appropriate? Yes with Assist; Fluid consistency: Thin  Diet effective now                  Intake/Output Summary (Last 24 hours) at 04/29/2022 1356 Last data filed at 04/29/2022 1300 Gross per 24 hour  Intake 600 ml  Output 1900 ml  Net -1300 ml   Net IO Since Admission: -10,929 mL [04/29/22 1356]  Wt Readings from Last 3 Encounters:  04/26/22 67.8 kg  02/21/22  65.8 kg  12/04/21 65.7 kg   Unresulted Labs (From admission, onward)    None     Data Reviewed: I have personally reviewed following labs and imaging studies CBC: Recent Labs  Lab 04/25/22 1020  WBC 16.3*  HGB 13.3  HCT 39.7  MCV 94.7  PLT 224   Basic Metabolic Panel: Recent Labs  Lab 04/25/22 1020  NA 137  K 3.9  CL 96*  CO2 33*  GLUCOSE 130*  BUN 13  CREATININE 0.66  CALCIUM 9.0   CBG: No results for input(s): "GLUCAP" in the last 168 hours. No results found for this or any previous visit (from the past 240 hour(s)).   Antimicrobials: Anti-infectives (From admission, onward)    Start     Dose/Rate Route Frequency Ordered Stop   04/26/22 1715  azithromycin (ZITHROMAX) tablet 500 mg        500 mg Oral Once per day on Mon Wed Fri 04/26/22 1709     04/09/22 1215  doxycycline (VIBRA-TABS) tablet 100 mg        100 mg Oral Every 12 hours 04/09/22 1201 04/13/22 2121     Culture/Microbiology    Component Value Date/Time   SDES BLOOD LEFT ANTECUBITAL 07/05/2019 1925   SPECREQUEST  07/05/2019 1925    BOTTLES DRAWN AEROBIC AND ANAEROBIC Blood Culture adequate volume   CULT  07/05/2019 1925    NO GROWTH 5 DAYS Performed at [facility], [place] [address] [place], [place] [address]    REPTSTATUS 07/10/2019 FINAL 07/05/2019 1925  Radiology Studies: No results found.  LOS: 20 days  Antonieta Pert, MD [facility]  04/29/2022, 1:56 PM

## 2022-04-29 NOTE — TOC Progression Note (Signed)
Transition of Care [facility]) - Progression Note    Patient Details  Name: Abigail Castillo MRN: [id number] Date of Birth: 1951-04-03  Transition of Care [facility]) CM/SW [place], [place] Phone Number: 04/29/2022, 10:41 AM  Clinical Narrative:     Updated SNF-[facility]- patient not stable for discharge today.   TOC will continue to follow.  Thurmond Butts, MSW, LCSW Clinical Social Worker    Expected Discharge Plan: Skilled Nursing Facility Barriers to Discharge: Barriers Resolved  Expected Discharge Plan and Services Expected Discharge Plan: [place]   Discharge Planning Services: CM Consult Post Acute Care Choice: Durable Medical Equipment, Home Health Living arrangements for the past 2 months: Mobile Home Expected Discharge Date: 04/25/22               DME Arranged: Nebulizer machine, Oxygen, Bipap DME Agency: [employer] Date DME Agency Contacted: 04/15/22 Time DME Agency Contacted: 65 Representative spoke with at DME Agency: Erasmo Downer HH Arranged: PT [place]: [place] Date [place]: 04/18/22 Time [place]: 61 Representative spoke with at [place]: [place] Determinants of Health ([place]) Interventions    Readmission Risk Interventions     No data to display

## 2022-04-29 NOTE — Consult Note (Signed)
Palliative Care Consult Note                                  Date: 04/29/2022   Patient Name: Abigail Castillo  DOB: 1951/01/13  MRN: [id number]  Age / Sex: 71 y.o., female  PCP: [name], NP Referring Physician: Antonieta Pert, MD  Reason for Consultation: Establishing goals of care  HPI/Patient Profile: 71 y.o. female  with past medical history of COPD, chronic respiratory failure with hypoxemia, depression, anxiety, GERD, and breast cancer on anastrozole who presented to [facility] ED on 04/09/2022 with shortness of breath. She was admitted with COPD exacerbation and acute on chronic respiratory failure. Hospital course as been prolonged by ongoing hypoxia with difficulty weaning O2 requirements. CT chest 11/20 was negative for PE but showed tracheobrochomalacia.  Palliative Medicine was consulted for goals of care in the setting of risk for decompensation and readmission.   Past Medical History:  Diagnosis Date   Acute on chronic respiratory failure with hypoxia ([place]) 07/05/2019   Anxiety    Blood transfusion    Blood transfusion without reported diagnosis    Breast cancer ([place]) 05/13/2021   Chronic mental illness    COPD (chronic obstructive pulmonary disease) ([place])    COVID-19 virus infection 07/06/2019   DEPRESSION 05/31/2009   Emphysema of lung (HCC)    Esophageal stricture 03/14/2015   GERD (gastroesophageal reflux disease)    Hepatic steatosis    History of home oxygen therapy    History of pneumonia    Osteoporosis    Oxygen dependent    on O2 at 2-3L/min 24hr/day   Shortness of breath dyspnea    Tobacco abuse    ongoing   Vitamin B 12 deficiency    Vitamin D deficiency     Subjective:   I have reviewed medical records including progress notes, labs and imaging, and met with patient at bedside to discuss diagnosis, prognosis, and goals of care.  Abigail Castillo is OOB to the recliner. She has no acute complaints. However,  our discussion is somewhat limited due to she easily becomes easily fatigued and dyspneic with talking. She is also drowsy (nodding off) several times during our discussion.   I introduced Palliative Medicine as specialized medical care for people living with serious illness. It focuses on providing relief from the symptoms and stress of a serious illness.   A brief life review was discussed. Abigail Castillo is divorced. She has 2 living children (1 son is deceased). Daughter Abigail Castillo lives in [place]. Son Abigail Castillo also lives in [place], but frequently works/stays out of town.  Abigail Castillo lives alone in her home in [place]. She is not able to verbalize what her baseline functional status was prior to admission.   We discussed patient's current illness and what it means in the larger context of her ongoing co-morbidities.  Provided education on the natural trajectory of COPD, emphasizing that it is a chronic and progressive illness. Discussed that chronic illness results in decreased functional status over time, as patients may not return to previous baseline after having a major illness.   Current clinical status was reviewed. Discussed that her hospital course had been prolonged due to ongoing hypoxia and difficulty weaning her oxygen requirements. Discussed that she was found to have tracheobronchomalacia, and provided education this is a condition that occurs when trachea tissue is soft and weak. Discussed that it can cause the trachea to collapse which  results in difficulty breathing.   Values and goals of care were attempted to be elicited. Abigail Castillo confirms that the plan is for rehab with the goal of improving functional status. She is ultimately hopeful to return home after rehab.   A discussion was had today regarding advanced directives. I encouraged Abigail Castillo to consider designating someone to make medical decisions for her if she was unable to make those decisions for herself. I left a blank advanced directive  packet for her to review.    We did discuss code status. Encouraged patient to consider DNR/DNI status understanding evidenced based poor outcomes in similar hospitalized patients, as the cause of the arrest is likely associated with chronic/terminal disease rather than a reversible acute cardio-pulmonary event. I explained that DNR/DNI is a protective measure to keep [place] from harming the patient in their last moments of life. Abigail Castillo requests time to consider this and is not able to make any decisions today.   With patient's permission, I later spoke with her daughter Abigail Castillo by phone and summarized the discussion above. She expresses appreciation for the update.   Review of Systems  Constitutional:  Positive for fatigue.  Respiratory:  Positive for shortness of breath.     Objective:   Primary Diagnoses: Present on Admission:  COPD (chronic obstructive pulmonary disease) ([place])  Acute on chronic respiratory failure with hypoxia (HCC)  Depression  Ductal carcinoma in situ (DCIS) of left breast  GERD (gastroesophageal reflux disease)   Physical Exam Vitals reviewed.  Constitutional:      General: She is not in acute distress.    Comments: Chronically ill-appearing Drowsy  Pulmonary:     Effort: No respiratory distress.     Comments: 8 L oxygen Neurological:     Mental Status: She is oriented to person, place, and time.  Psychiatric:        Behavior: Behavior normal.     Vital Signs:  BP 105/83 (BP Location: Left Arm)   Pulse (!) 110   Temp 97.6 F (36.4 C) (Oral)   Resp 18   Ht _0  (1.575 m)   Wt 67.8 kg   SpO2 98%   BMI 27.34 kg/m   Palliative Assessment/Data: PPS 40-50%     Assessment & Plan:   SUMMARY OF RECOMMENDATIONS   Full code for now Full scope care Goal of care is for rehab to improve functional status and ultimately return home Encouraged to complete advanced directives I will follow-up tomorrow   Primary Decision Maker: PATIENT  Symptom  Management:  Continue zyprexa for anxiety  Prognosis:  Unable to determine  Discharge Planning:  SNF for rehab   Thank you for allowing [place] to participate in the care of Abigail Castillo  MDM - [place]  Signed by: Elie Confer, NP Palliative Medicine Team  Team Phone # [phone]  For individual providers, please see AMION

## 2022-04-29 NOTE — Progress Notes (Addendum)
NAME:  Abigail Castillo, MRN:  [id number], DOB:  01-Apr-1951, LOS: 71 ADMISSION DATE:  04/09/2022, CONSULTATION DATE: April 10, 2022 REFERRING MD: Tyrell Antonio, CHIEF COMPLAINT: Dyspnea  History of Present Illness:  71 year old female with COPD, chronic respiratory failure with hypoxemia who follows with Dr. Lamonte Sakai presented for shortness of breath to the emergency department yesterday.  She says that last week she got a cold and she has been having allergies.  She had significant postnasal drip and sinus congestion.  She has had some cough but not much mucus production.  Her primary complaint yesterday was shortness of breath.  She denies leg swelling.  She has not had hemoptysis or chest pain.  In the emergency department she has been treated with bronchodilators, steroids, and required noninvasive mechanical ventilation.  The noninvasive mechanical ventilation was just stopped and she says she is feeling a bit better.  She is currently on 4 L of oxygen, she uses 2-3 at home.  She says that she quit smoking several years ago.    Pertinent  Medical History    has a past medical history of Acute on chronic respiratory failure with hypoxia ([place]) (07/05/2019), Anxiety, Blood transfusion, Blood transfusion without reported diagnosis, Breast cancer ([place]) (05/13/2021), Chronic mental illness, COPD (chronic obstructive pulmonary disease) ([place]), COVID-19 virus infection (07/06/2019), DEPRESSION (05/31/2009), Emphysema of lung ([place]), Esophageal stricture (03/14/2015), GERD (gastroesophageal reflux disease), Hepatic steatosis, History of home oxygen therapy, History of pneumonia, Osteoporosis, Oxygen dependent, Shortness of breath dyspnea, Tobacco abuse, Vitamin B 12 deficiency, and Vitamin D deficiency.   Significant Hospital Events: Including procedures, antibiotic start and stop dates in addition to other pertinent events   April 09, 2022 respiratory viral panel negative for flu and COVID November 2015  chest x-ray images independently reviewed showing emphysema, interstitial changes in bases 11/16 pulmonary consulted for dypsnea. Rec COPD tx. Prolonged inpatient course for acute on chronic hypoxia, O2 not weaning.  CT 11/20 to rule out PE, showed tracheobronchomalacia 12/1 PCCM re-consulted for ongoing oxygen requirement.  04/29/2022 pulmonary critical care reconsulted for hypoxia  Interim History / Subjective:  04/29/2022 called back due to FiO2 needs to be increased to 8 L  Objective   Blood pressure 105/83, pulse (!) 110, temperature 97.6 F (36.4 C), temperature source Oral, resp. rate 18, height '5\' 2"'$  (1.575 m), weight 67.8 kg, SpO2 98 %.        Intake/Output Summary (Last 24 hours) at 04/29/2022 1003 Last data filed at 04/29/2022 0900 Gross per 24 hour  Intake 360 ml  Output 1650 ml  Net -1290 ml   Filed Weights   04/10/22 1242 04/23/22 0515 04/26/22 0318  Weight: 65 kg 71.4 kg 67.8 kg    Examination: General: Elderly female who is deconditioned and weanable acute distress HEENT: MM pink/moist no JVD is appreciated Neuro: Depressed defect but otherwise intact CV: Heart sounds are regular PULM: Poor air movement throughout.  On 8 L with O2 saturation 96% Requiring nocturnal  GI: soft, bsx4 active  Extremities: warm/dry, negative edema  Skin: no rashes or lesions  Chest x-ray is pending   Resolved Hospital Problem list     Assessment & Plan:    Acute on chronic respiratory failure with hypoxemia She is evaluated by pulmonary on 04/26/2022 at which time her steroids were resumed at 40 mg daily with some improvement 2D echo was obtained with a hyperdynamic left ventricle EF of 22% grade 1 diastolic dysfunction. She is now back on 8 L nasal cannula sats  of 89 to 98% %.  Without apparent distress. Continue steroids at 40 mg daily She is extremely weak and deconditioned She may need nursing home placement in the near future She has a follow-up appointment [place]  on 05/09/2022 if she is out of the hospital at that time. Continue bronchodilators Continue nocturnal BiPAP and as needed Continue physical therapy for deconditioning BID ativan may not be helping due to resp depression Check chest x-ray for completeness   Tracheomalacia Nocturnal and as needed positive pressure ventilation She may require tracheostomy in the future.   History of breast cancer Per primary    Richardson Landry Tamarius Rosenfield ACNP Acute Care Nurse Practitioner [place] Please consult [place] 04/29/2022, 10:03 AM

## 2022-04-29 NOTE — Progress Notes (Signed)
Physical Therapy Treatment Patient Details Name: Abigail Castillo MRN: [id number] DOB: 1951-05-23 Today's Date: 04/29/2022   History of Present Illness Pt is a 71 y.o. F who presents 04/09/2022 with COPD with acute exacerbation. Significant PMH: COPD, chronic respiratory failure with hypoxemia, COVID-19, osteoporosis, tobacco abuse.    PT Comments    Patient progressing slowly towards PT goals. Session focused on progressive ambulation, endurance training and activity tolerance. Pt improved ambulation distance with use of rollator and supervision for safety. Pt able to self monitor for symptoms and delegate amount of activity she can tolerate this session. Continues to have 3-4/4 DOE with activity and needing >5 minutes to recover. Sp02 dropped to 85% on 8L/min 02 [place] with walking. Encouraged short bouts of activity more often with longer rest breaks to improve endurance and tolerance. Continue to recommend SNF. Will follow.   Recommendations for follow up therapy are one component of a multi-disciplinary discharge planning process, led by the attending physician.  Recommendations may be updated based on patient status, additional functional criteria and insurance authorization.  Follow Up Recommendations  Skilled nursing-short term rehab (<3 hours/day)     Assistance Recommended at Discharge Set up Supervision/Assistance  Patient can return home with the following Assistance with cooking/housework;Assist for transportation;Help with stairs or ramp for entrance;A little help with walking and/or transfers;A little help with bathing/dressing/bathroom   Equipment Recommendations  None recommended by PT    Recommendations for Other Services       Precautions / Restrictions Precautions Precautions: Fall;Other (comment) Precaution Comments: watch O2 Restrictions Weight Bearing Restrictions: No     Mobility  Bed Mobility Overal bed mobility: Needs Assistance Bed Mobility: Supine to Sit      Supine to sit: Supervision, HOB elevated     General bed mobility comments: Supervision for safety, use of rail.    Transfers Overall transfer level: Needs assistance Equipment used: Rollator (4 wheels) Transfers: Sit to/from Stand Sit to Stand: Supervision           General transfer comment: Supervision for safety, stood from EOB x1. no difficulties    Ambulation/Gait Ambulation/Gait assistance: Min guard Gait Distance (Feet): 50 Feet Assistive device: Rollator (4 wheels) Gait Pattern/deviations: Step-through pattern, Decreased stride length Gait velocity: decreased Gait velocity interpretation: 1.31 - 2.62 ft/sec, indicative of limited community ambulator   General Gait Details: Slow, steady gait using rollator, 3-4/4 DOE, Sp02 dropped to 85% on 8L//min 02 [place] with activity. Took ~5 mins to recover with cues for pursed lip breathing.   Stairs             Wheelchair Mobility    Modified Rankin (Stroke Patients Only)       Balance Overall balance assessment: Needs assistance Sitting-balance support: Feet supported, No upper extremity supported Sitting balance-Leahy Scale: Good     Standing balance support: During functional activity Standing balance-Leahy Scale: Fair                              Cognition Arousal/Alertness: Awake/alert Behavior During Therapy: Flat affect Overall Cognitive Status: No family/caregiver present to determine baseline cognitive functioning                                 General Comments: Slow processing/response time, does not do well with multiple options.        Exercises      General  Comments General comments (skin integrity, edema, etc.): Sp02 dropped to 85% on 8L/min 02 [place] with walking and took ~ 5 mins to recover seated EOB.      Pertinent Vitals/Pain Pain Assessment Pain Assessment: No/denies pain    Home Living                          Prior Function             PT Goals (current goals can now be found in the care plan section) Acute Rehab PT Goals Patient Stated Goal: to improve breathing, go to rehab PT Goal Formulation: With patient Time For Goal Achievement: 05/13/22 Potential to Achieve Goals: Good Progress towards PT goals: Progressing toward goals    Frequency    Min 2X/week      PT Plan Current plan remains appropriate    Co-evaluation              AM-PAC PT "6 Clicks" Mobility   Outcome Measure  Help needed turning from your back to your side while in a flat bed without using bedrails?: A Little Help needed moving from lying on your back to sitting on the side of a flat bed without using bedrails?: A Little Help needed moving to and from a bed to a chair (including a wheelchair)?: A Little Help needed standing up from a chair using your arms (e.g., wheelchair or bedside chair)?: A Little Help needed to walk in hospital room?: A Little Help needed climbing 3-5 steps with a railing? : A Lot 6 Click Score: 17    End of Session Equipment Utilized During Treatment: Oxygen;Gait belt Activity Tolerance: Treatment limited secondary to medical complications (Comment);Patient limited by fatigue (DOE) Patient left: in bed;with call bell/phone within reach Nurse Communication: Mobility status;Other (comment) (purewick) PT Visit Diagnosis: Difficulty in walking, not elsewhere classified (R26.2);Unsteadiness on feet (R26.81);Other abnormalities of gait and mobility (R26.89)     Time: 1683-7290 PT Time Calculation (min) (ACUTE ONLY): 19 min  Charges:  $Therapeutic Exercise: 8-22 mins                     Marisa Severin, PT, DPT Acute Rehabilitation Services Secure chat preferred Office [phone]      Abigail Castillo 04/29/2022, 12:29 PM

## 2022-04-29 NOTE — Progress Notes (Signed)
Mobility Specialist Progress Note:   04/29/22 1200  Mobility  Activity Ambulated with assistance in room;Ambulated with assistance to bathroom  Level of Assistance Standby assist, set-up cues, supervision of patient - no hands on  Assistive Device Front wheel walker  Distance Ambulated (ft) 40 ft  Activity Response Tolerated well  $Mobility charge 1 Mobility   Pt received in chair. Ambulated to sink and pt was able to wash up. No complaints of pain. Left in chair with call bell in reach and all needs met.   Gareth Eagle Masayo Fera Mobility Specialist Please contact via [employer] or  Rehab Office at [phone]

## 2022-04-29 NOTE — Progress Notes (Signed)
Patient placed on full face BIPAP 10/5 with 5L oxygen.  Patient tolerating well at this time.

## 2022-04-30 DIAGNOSIS — Z7189 Other specified counseling: Secondary | ICD-10-CM | POA: Diagnosis not present

## 2022-04-30 DIAGNOSIS — J398 Other specified diseases of upper respiratory tract: Secondary | ICD-10-CM | POA: Diagnosis not present

## 2022-04-30 DIAGNOSIS — J441 Chronic obstructive pulmonary disease with (acute) exacerbation: Secondary | ICD-10-CM | POA: Diagnosis not present

## 2022-04-30 DIAGNOSIS — Z515 Encounter for palliative care: Secondary | ICD-10-CM | POA: Diagnosis not present

## 2022-04-30 DIAGNOSIS — J9621 Acute and chronic respiratory failure with hypoxia: Secondary | ICD-10-CM | POA: Diagnosis not present

## 2022-04-30 LAB — BASIC METABOLIC PANEL
Anion gap: 8 (ref 5–15)
BUN: 20 mg/dL (ref 8–23)
CO2: 33 mmol/L — ABNORMAL HIGH (ref 22–32)
Calcium: 9.3 mg/dL (ref 8.9–10.3)
Chloride: 96 mmol/L — ABNORMAL LOW (ref 98–111)
Creatinine, Ser: 0.75 mg/dL (ref 0.44–1.00)
GFR, Estimated: >60 mL/min (ref >60)
Glucose, Bld: 125 mg/dL — ABNORMAL HIGH (ref 70–99)
Potassium: 4.2 mmol/L (ref 3.5–5.1)
Sodium: 137 mmol/L (ref 135–145)

## 2022-04-30 LAB — CBC
HCT: 36.7 % (ref 36.0–46.0)
Hemoglobin: 12 g/dL (ref 12.0–15.0)
MCH: 31.4 pg (ref 26.0–34.0)
MCHC: 32.7 g/dL (ref 30.0–36.0)
MCV: 96.1 fL (ref 80.0–100.0)
Platelets: 201 10*3/uL (ref 150–400)
RBC: 3.82 MIL/uL — ABNORMAL LOW (ref 3.87–5.11)
RDW: 13.4 % (ref 11.5–15.5)
WBC: 12.6 10*3/uL — ABNORMAL HIGH (ref 4.0–10.5)
nRBC: 0 % (ref 0.0–0.2)

## 2022-04-30 MED ORDER — IPRATROPIUM-ALBUTEROL 0.5-2.5 (3) MG/3ML IN SOLN
3.0000 mL | RESPIRATORY_TRACT | Status: DC
  Administered 2022-04-30 – 2022-05-03 (×15): 3 mL via RESPIRATORY_TRACT
  Filled 2022-04-30 (×16): qty 3

## 2022-04-30 NOTE — Progress Notes (Signed)
Pt tripoding upon arrival to room and having increased WOB and C/O SOB. Pt found to be on 5L HFNC, previously charted on 8L. RT increased back to 8L HFNC and gave scheduled Neb Tx's. RT will closely monitor pt

## 2022-04-30 NOTE — Progress Notes (Signed)
This chaplain responded to the PMT NP-Julia's consult for notarizing the Pt. Advance Directive:  HCPOA.  The Pt. did not complete a Living Will. The Pt. completed AD education with the NP. The Pt. is able to answer clarifying questions on her HCPOA with the chaplain.  The chaplain is present with the Pt., notary, and witnesses for the notarizing of the Pt. AD:  HCPOA.  The Pt. named Candice Tidwell as her healthcare agent. If the healthcare agent is unwilling or unable to serve in this role the Pt. next choice is Meta Hatchet, Sr.  The chaplain gave the Pt. the original AD along with two copies. The chaplain scanned the Pt. AD into her EMR.  This chaplain is available for F/U spiritual care as needed.  Chaplain Sallyanne Kuster [phone]

## 2022-04-30 NOTE — Progress Notes (Signed)
Visit made to patients room to place her om CPAP.  Patient has recently had some liquids and wanted to wait a little while to insure she wouldn't get sick while wearing mask.  Rn aware that RT will return and place pt on when she is ready.

## 2022-04-30 NOTE — Progress Notes (Signed)
NAME:  Abigail Castillo, MRN:  [id number], DOB:  Apr 02, 1951, LOS: 21 ADMISSION DATE:  04/09/2022, CONSULTATION DATE: April 10, 2022 REFERRING MD: Tyrell Antonio, CHIEF COMPLAINT: Dyspnea  History of Present Illness:  71 year old female with COPD, chronic respiratory failure with hypoxemia who follows with Dr. Lamonte Sakai presented for shortness of breath to the emergency department yesterday.  She says that last week she got a cold and she has been having allergies.  She had significant postnasal drip and sinus congestion.  She has had some cough but not much mucus production.  Her primary complaint yesterday was shortness of breath.  She denies leg swelling.  She has not had hemoptysis or chest pain.  In the emergency department she has been treated with bronchodilators, steroids, and required noninvasive mechanical ventilation.  The noninvasive mechanical ventilation was just stopped and she says she is feeling a bit better.  She is currently on 4 L of oxygen, she uses 2-3 at home.  She says that she quit smoking several years ago.    Pertinent  Medical History    has a past medical history of Acute on chronic respiratory failure with hypoxia ([place]) (07/05/2019), Anxiety, Blood transfusion, Blood transfusion without reported diagnosis, Breast cancer ([place]) (05/13/2021), Chronic mental illness, COPD (chronic obstructive pulmonary disease) ([place]), COVID-19 virus infection (07/06/2019), DEPRESSION (05/31/2009), Emphysema of lung ([place]), Esophageal stricture (03/14/2015), GERD (gastroesophageal reflux disease), Hepatic steatosis, History of home oxygen therapy, History of pneumonia, Osteoporosis, Oxygen dependent, Shortness of breath dyspnea, Tobacco abuse, Vitamin B 12 deficiency, and Vitamin D deficiency.   Significant Hospital Events: Including procedures, antibiotic start and stop dates in addition to other pertinent events   April 09, 2022 respiratory viral panel negative for flu and COVID November 2015  chest x-ray images independently reviewed showing emphysema, interstitial changes in bases 11/16 pulmonary consulted for dypsnea. Rec COPD tx. Prolonged inpatient course for acute on chronic hypoxia, O2 not weaning.  CT 11/20 to rule out PE, showed tracheobronchomalacia 12/1 PCCM re-consulted for ongoing oxygen requirement.  04/29/2022 pulmonary critical care reconsulted for hypoxia  Interim History / Subjective:  04/29/2022 called back due to FiO2 needs to be increased to 8 L 04/30/2022 back on 4 L nasal cannula  Objective   Blood pressure 114/75, pulse 95, temperature 97.7 F (36.5 C), temperature source Oral, resp. rate 18, height '5\' 2"'$  (1.575 m), weight 67.8 kg, SpO2 96 %.        Intake/Output Summary (Last 24 hours) at 04/30/2022 0825 Last data filed at 04/30/2022 6629 Gross per 24 hour  Intake 920 ml  Output 950 ml  Net -30 ml   Filed Weights   04/10/22 1242 04/23/22 0515 04/26/22 0318  Weight: 65 kg 71.4 kg 67.8 kg    Examination: General: Frail elderly female sitting up eating  HEENT: MM pink/moist no JVD is noted Neuro: Dull afebrile otherwise intact CV: Heart sounds are regular PULM: Decreased air movement throughout currently on 4 L nasal cannula GI: soft, bsx4 active  Extremities: warm/dry, negative edema  Skin: no rashes or lesions    Resolved Hospital Problem list     Assessment & Plan:    Acute on chronic respiratory failure with hypoxemia Continue steroids until the wean per pulmonary I suspect her lung disease is end-stage in nature she will continue to have acute exacerbations despite interventions. Consider palliation Pulmonary critical care will sign off again be available as needed Continue bronchodilators Currently on 4 L nasal cannula which is her baseline  Tracheomalacia Nocturnal and as needed positive pressure ventilation She may require tracheostomy in the future.   History of breast cancer Per primary    Richardson Landry Aron Needles  ACNP Acute Care Nurse Practitioner [place] Please consult [place] 04/30/2022, 8:25 AM

## 2022-04-30 NOTE — Progress Notes (Addendum)
Mobility Specialist Progress Note:   04/30/22 1244  Mobility  Activity Ambulated with assistance in room  Level of Assistance Contact guard assist, steadying assist  Assistive Device  (HHA)  Distance Ambulated (ft) 4 ft  Activity Response Tolerated well  $Mobility charge 1 Mobility   Pt received in bed. No complaints of pain. Left in chair with call bell in reach and all needs met.   Gareth Eagle Abhi Moccia Mobility Specialist Please contact via [employer] or  Rehab Office at [phone]

## 2022-04-30 NOTE — Plan of Care (Signed)
  Problem: Activity: Goal: Ability to tolerate increased activity will improve Outcome: Progressing   Problem: Respiratory: Goal: Ability to maintain a clear airway will improve Outcome: Progressing   

## 2022-04-30 NOTE — Progress Notes (Signed)
Palliative Medicine Progress Note   Patient Name: Abigail Castillo       Date: 04/30/2022 DOB: 01/04/1951  Age: 71 y.o. MRN#: [id number] Attending Physician: Samuella Cota, MD Primary Care Physician: [name], NP Admit Date: 04/09/2022  Reason for Consultation/Follow-up: {Reason for Consult:23484}  HPI/Patient Profile: 71 y.o. female  with past medical history of COPD, chronic respiratory failure with hypoxemia, depression, anxiety, GERD, and breast cancer on anastrozole who presented to [facility] ED on 04/09/2022 with shortness of breath. She was admitted with COPD exacerbation and acute on chronic respiratory failure. Hospital course as been prolonged by ongoing hypoxia with difficulty weaning O2 requirements. CT chest 11/20 was negative for PE but showed tracheobrochomalacia.  Palliative Medicine was consulted for goals of care in the setting of risk for decompensation and readmission.     Subjective: ***  Objective:  Physical Exam          Vital Signs: BP 114/67 (BP Location: Left Arm)   Pulse (!) 110   Temp 98.5 F (36.9 C) (Axillary)   Resp 20   Ht '5\' 2"'$  (1.575 m)   Wt 67.8 kg   SpO2 100%   BMI 27.34 kg/m  SpO2: SpO2: 100 % O2 Device: O2 Device: High Flow Nasal Cannula O2 Flow Rate: O2 Flow Rate (L/min): 8 L/min   LBM: Last BM Date : 04/29/22      Palliative Medicine Assessment & Plan   Assessment: Principal Problem:   COPD (chronic obstructive pulmonary disease) (HCC) Active Problems:   Depression   GERD (gastroesophageal reflux disease)   Acute on chronic respiratory failure with hypoxia (HCC)   Ductal carcinoma in situ (DCIS) of left breast   Hyponatremia   Bronchomalacia   Localized edema   COPD exacerbation (HCC)   Tracheobronchomalacia    Acute on chronic respiratory failure with hypoxia and hypercapnia (HCC)    Recommendations/Plan: ***  Goals of Care and Additional Recommendations: Limitations on Scope of Treatment: {Recommended Scope and Preferences:21019}  Code Status:   Prognosis:  {Palliative Care Prognosis:23504}  Discharge Planning: {Palliative dispostion:23505}  Care plan was discussed with ***  Thank you for allowing the Palliative Medicine Team to assist in the care of this patient.   ***   Lavena Bullion, NP   Please contact Palliative Medicine Team phone at [phone] for  questions and concerns.  For individual providers, please see AMION.

## 2022-04-30 NOTE — Progress Notes (Signed)
  Progress Note   Patient: Abigail Castillo SNK:[id number] DOB: 12-14-1950 DOA: 04/09/2022     21 DOS: the patient was seen and examined on 04/30/2022   Brief hospital course: 71 year old woman PMH COPD, chronic hypoxic respiratory failure on 2-3 L, moderate pulmonary hypertension presented with shortness of breath.  Seen by pulmonary, treated with bronchodilators, steroids for acute COPD exacerbation complicated by tracheobronchomalacia.  Plan for BiPAP at home.  Planning for SNF once respiratory status improved.  Assessment and Plan: Acute exacerbation of severe COPD Acute chronic hypoxic respiratory failure: CT without complicating features.  Treated with antibiotics.   Started azithromycin for acute on chronic COPD exacerbation>continue prednisone 40 mg with plan to continue long-term until seen at pulmonary office, continue azithromycin azithromycin 3 times weekly for anti-inflammatory, Claritin and Singulair, continue on increased nebulizer and inhaler-appreciate pulmonary management. Back down to 4 L today.  Hopefully will remain at least stable. Pulmonology concerned patient may not improve enough to safely discharge.   Tracheobronchomalacia Noted on CT during this admission seen by pulmonary, plan is to continue on BiPAP at bedtime and as needed, daytime supplemental oxygen and outpatient pulmonary follow-up.  Home BiPAP has been arranged  Anxiety/depression mildly anxious, somewhat anxious at times.Continue her olanzapine and Ativan. Hypertension BP stable, continue amlodipine 2.'5MG'$   History of breast cancer status post-lumpectomy:Continue anastrozole     Subjective:  Breathing better now Down to 4L  Physical Exam: Vitals:   04/30/22 0840 04/30/22 1136 04/30/22 1149 04/30/22 2003  BP: 112/66 114/67  137/84  Pulse: (!) 106 (!) 110  (!) 109  Resp: 20   20  Temp: 97.7 F (36.5 C) 98.5 F (36.9 C)  98.4 F (36.9 C)  TempSrc: Oral Axillary  Oral  SpO2: 98% 91% 100% 91%   Weight:      Height:       Physical Exam Vitals reviewed.  Constitutional:      General: She is not in acute distress.    Appearance: She is ill-appearing. She is not toxic-appearing.  Cardiovascular:     Rate and Rhythm: Normal rate and regular rhythm.     Heart sounds: No murmur heard. Pulmonary:     Effort: No respiratory distress.     Breath sounds: No wheezing, rhonchi or rales.  Neurological:     Mental Status: She is alert.  Psychiatric:        Mood and Affect: Mood normal.        Behavior: Behavior normal.     Data Reviewed: BMP noted CBC noted  Family Communication: daughter at bedside  Disposition: Status is: Inpatient Remains inpatient appropriate because: severe COPD  Planned Discharge Destination:  TBD    Time spent: 25 minutes  Author: Murray Hodgkins, MD 04/30/2022 8:25 PM  For on call review [email or web address].

## 2022-05-01 NOTE — Progress Notes (Signed)
Mobility Specialist Progress Note:   05/01/22 1300  Mobility  Activity Ambulated with assistance in room  Level of Assistance Standby assist, set-up cues, supervision of patient - no hands on  Assistive Device Front wheel walker  Distance Ambulated (ft) 34 ft  Activity Response Tolerated well  $Mobility charge 1 Mobility   Pt received in bed willing to participate in mobility. No complaints of pain. Left in chair with call bell in reach and all needs met.   Gareth Eagle Aiman Sonn Mobility Specialist Please contact via [employer] or  Rehab Office at [phone]

## 2022-05-01 NOTE — Progress Notes (Signed)
  Progress Note   Patient: Abigail Castillo TKW:[id number] DOB: 03/30/1951 DOA: 04/09/2022     22 DOS: the patient was seen and examined on 05/01/2022   Brief hospital course: 71 year old woman PMH COPD, chronic hypoxic respiratory failure on 2-3 L, moderate pulmonary hypertension presented with shortness of breath.  Seen by pulmonary, treated with bronchodilators, steroids for acute COPD exacerbation complicated by tracheobronchomalacia.  Plan for BiPAP at home.  Planning for SNF once respiratory status improved.  Assessment and Plan: Acute exacerbation of severe COPD Acute chronic hypoxic respiratory failure: CT without complicating features.  Treated with antibiotics.   Started azithromycin for acute on chronic COPD exacerbation>continue prednisone 40 mg with plan to continue long-term until seen at pulmonary office, continue azithromycin azithromycin 3 times weekly for anti-inflammatory, Claritin and Singulair, continue on increased nebulizer and inhaler  Stable on 4 L today.  wean to 3. Pulmonology concerned patient may not improve enough to safely discharge.   Tracheobronchomalacia Noted on CT during this admission seen by pulmonary, plan is to continue on BiPAP at bedtime and as needed, daytime supplemental oxygen and outpatient pulmonary follow-up. Home BiPAP has been arranged   Anxiety/depression  Stable. Continue olanzapine and Ativan. Hypertension BP stable, continue amlodipine 2.'5MG'$   History of breast cancer status post-lumpectomy:Continue anastrozole  Subjective:  Feels ok, but SOB, especially with movement  Physical Exam: Vitals:   05/01/22 0022 05/01/22 0029 05/01/22 0334 05/01/22 0806  BP: 110/73 110/73 (!) 143/80 117/75  Pulse: (!) 103 (!) 102 (!) 103 (!) 114  Resp: 20 (!) '22 18 18  '$ Temp: 97.7 F (36.5 C)  97.9 F (36.6 C) 97.8 F (36.6 C)  TempSrc: Axillary  Oral Oral  SpO2: 100% 97% 96% 92%  Weight:      Height:       Physical Exam Vitals and nursing note  reviewed.  Cardiovascular:     Rate and Rhythm: Normal rate and regular rhythm.     Heart sounds: No murmur heard. Pulmonary:     Effort: No respiratory distress.     Breath sounds: No wheezing, rhonchi or rales.     Comments: Poor air movement, moderate increased respiratory effort Neurological:     Mental Status: She is alert.  Psychiatric:        Mood and Affect: Mood normal.        Behavior: Behavior normal.     Data Reviewed: No new data  Family Communication: none  Disposition: Status is: Inpatient Remains inpatient appropriate because: severe COPD  Planned Discharge Destination: Skilled nursing facility    Time spent: 20 minutes  Author: Murray Hodgkins, MD 05/01/2022 9:39 AM  For on call review [email or web address].

## 2022-05-01 NOTE — Progress Notes (Signed)
Pt placed on BIPAP tolerating well. 

## 2022-05-02 DIAGNOSIS — Z515 Encounter for palliative care: Secondary | ICD-10-CM | POA: Diagnosis not present

## 2022-05-02 NOTE — Progress Notes (Signed)
   Palliative Medicine Inpatient Follow Up Note HPI: 71 y.o. female  with past medical history of COPD, chronic respiratory failure with hypoxemia, depression, anxiety, GERD, and breast cancer on anastrozole who presented to [facility] ED on 04/09/2022 with shortness of breath. She was admitted with COPD exacerbation and acute on chronic respiratory failure. Hospital course as been prolonged by ongoing hypoxia with difficulty weaning O2 requirements.CT chest 11/20 was negative for PE but showed tracheobrochomalacia. Palliative Medicine was consulted for goals of care in the setting of risk for decompensation and readmission.   Today's Discussion 05/02/2022  *Please note that this is a verbal dictation therefore any spelling or grammatical errors are due to the "[place] One" system interpretation.  Chart reviewed inclusive of vital signs, progress notes, laboratory results, and diagnostic images.   I met with Rite this morning. She shares that he O2 needs continue to go down and she is overall feeling much improved.  Created space and opportunity for patient to explore thoughts feelings and fears regarding current medical situation. Reviewed the risks of interventions such as intubation in progressed COPD. Rite is still thinking about what here wishes would be. Discussed that I will be here all weekend and we can continue in the oncoming days to review the MOST form and what her wishes would or would not be.   Brena is at this time hopeful for ongoing improvements.   Questions and concerns addressed/Palliative Support Provided.   Objective Assessment: Vital Signs Vitals:   05/02/22 0541 05/02/22 0733  BP: 123/79   Pulse:    Resp: 20   Temp: 97.7 F (36.5 C)   SpO2: 98% 96%    Intake/Output Summary (Last 24 hours) at 05/02/2022 1108 Last data filed at 05/01/2022 2012 Gross per 24 hour  Intake 240 ml  Output 1050 ml  Net -810 ml   Last Weight  Most recent update: 04/26/2022  3:19 AM     Weight  67.8 kg (149 lb 7.6 oz)            Gen:  Frail elderly F in NAD HEENT: moist mucous membranes CV: Regular rate and rhythm  PULM: on 3LPM [place], breathing is even and nonlabored ABD: soft/nontender EXT: No edema Neuro: Alert and oriented x3  SUMMARY OF RECOMMENDATIONS   Full code/Continue full scope care --> Ongoing conversations related to this Goal of care is for rehab to improve functional status and ultimately return home Spiritual care completed HCPOA forms on 12/6  Ongoing PMT support  Billing based on MDM: High --> formal review of risks of intervention such as intubation in the setting of COPD and additional co-morbidities ______________________________________________________________________________________ [place] Team Team Cell Phone: [phone] Please utilize secure chat with additional questions, if there is no response within 30 minutes please call the above phone number  Palliative Medicine Team providers are available by phone from 7am to 7pm daily and can be reached through the team cell phone.  Should this patient require assistance outside of these hours, please call the patient's attending physician.

## 2022-05-02 NOTE — Progress Notes (Signed)
Mobility Specialist Progress Note   05/02/22 1125  Mobility  Activity Ambulated with assistance in room;Transferred to/from [facility] (in chair before and after ambulation)  Level of Assistance Standby assist, set-up cues, supervision of patient - no hands on  Assistive Device Front wheel walker;BSC  Distance Ambulated (ft) 18 ft  Range of Motion/Exercises Active;All extremities  Activity Response Tolerated poorly;RN notified   Pre Ambulation: SpO2 89-93% 3LO2 During Ambulation: SpO2 77% 4LO2 Post Ambulation: SpO2 64% 4LO2; 90% 4LO2 after 10 minute recovery/pursed lip breathing  Patient received in supine eager to participate. Ambulated short distance outside of room with supervision level and steady gait. Distance limited by bowel urgency and SOB. Quickly returned to room and sat on BSC. Was 3/4 dyspneic with exertion in which oxygen desaturated to mid 70's and eventually dropping to 64% upon sitting on BSC. Required increased time, approximately 10 minutes and pursed lip breathing to recover to 90% on 4LO2. Was left on [facility] with RN and NT present.  Martinique Creedence Kunesh, BS EXP Mobility Specialist Please contact via [employer] or Rehab office at [phone]

## 2022-05-02 NOTE — Progress Notes (Signed)
  Progress Note   Patient: Abigail Castillo YHC:[id number] DOB: December 28, 1950 DOA: 04/09/2022     23 DOS: the patient was seen and examined on 05/02/2022   Brief hospital course: 71 year old woman PMH COPD, chronic hypoxic respiratory failure on 2-3 L, moderate pulmonary hypertension presented with shortness of breath.  Seen by pulmonary, treated with bronchodilators, steroids for acute COPD exacerbation complicated by tracheobronchomalacia.  Plan for BiPAP at home.  Planning for SNF once respiratory status improved.  Assessment and Plan: Acute exacerbation of severe COPD Acute chronic hypoxic respiratory failure: CT without complicating features.  Treated with antibiotics.   Started azithromycin for acute on chronic COPD exacerbation>continue prednisone 40 mg with plan to continue long-term until seen at pulmonary office, continue azithromycin azithromycin 3 times weekly for anti-inflammatory, Claritin and Singulair, continue on increased nebulizer and inhaler  Remains stable on 4 L.  Pulmonology concerned patient may not improve enough to safely discharge, but seems to have stabilized and even improved mily.   Tracheobronchomalacia Noted on CT during this admission seen by pulmonary, plan is to continue on BiPAP at bedtime and as needed, daytime supplemental oxygen and outpatient pulmonary follow-up. Home BiPAP has been arranged   Anxiety/depression  Stable. Continue olanzapine and Ativan. History of breast cancer status post-lumpectomy:Continue anastrozole  Mobility Level: 4 Movement: 19f  Disposition  PT: SNF Pt: SNF  Subjective:  Refused BiPAP last night Feels better today, breathing better  Physical Exam: Vitals:   05/02/22 1255 05/02/22 1609 05/02/22 1645 05/02/22 1709  BP:  (!) 169/83    Pulse: (!) 120   (!) 120  Resp: (!) 22 (!) 30    Temp:  97.9 F (36.6 C)    TempSrc:  Axillary    SpO2: 95% (!) 88% (!) 89% 92%  Weight:      Height:       Physical Exam Vitals and  nursing note reviewed.  Constitutional:      General: She is not in acute distress.    Appearance: She is not ill-appearing or toxic-appearing.  Cardiovascular:     Rate and Rhythm: Normal rate and regular rhythm.     Heart sounds: No murmur heard. Pulmonary:     Effort: No respiratory distress.     Breath sounds: No wheezing, rhonchi or rales.     Comments: Mild increased respiratory effort Neurological:     Mental Status: She is alert.  Psychiatric:        Mood and Affect: Mood normal.        Behavior: Behavior normal.     Data Reviewed: No new data  Family Communication: None  Disposition: Status is: Inpatient Remains inpatient appropriate because: COPD  Planned Discharge Destination: Skilled nursing facility    Time spent: 20 minutes  Author: DMurray Hodgkins MD 05/02/2022 5:20 PM  For on call review [email or web address]

## 2022-05-02 NOTE — Progress Notes (Signed)
Pt refused BIPAP for the night

## 2022-05-02 NOTE — Plan of Care (Signed)
  Problem: Respiratory: Goal: Levels of oxygenation will improve Outcome: Not Progressing Goal: Ability to maintain adequate ventilation will improve Outcome: Not Progressing

## 2022-05-02 NOTE — Progress Notes (Signed)
PT Cancellation Note  Patient Details Name: Abigail Castillo MRN: [id number] DOB: [birth year]   Cancelled Treatment:    Reason Eval/Treat Not Completed: Other (comment) - pt tachypneic with SPO2 87% on 4LO2, pt requesting a breathing treatment. NT and RN aware.  Stacie Glaze, PT DPT Acute Rehabilitation Services Pager [phone]  Office [phone]    Louis Matte 05/02/2022, 4:28 PM

## 2022-05-03 DIAGNOSIS — Z515 Encounter for palliative care: Secondary | ICD-10-CM | POA: Diagnosis not present

## 2022-05-03 MED ORDER — IPRATROPIUM-ALBUTEROL 0.5-2.5 (3) MG/3ML IN SOLN
3.0000 mL | Freq: Four times a day (QID) | RESPIRATORY_TRACT | Status: DC
  Administered 2022-05-03 (×2): 3 mL via RESPIRATORY_TRACT
  Filled 2022-05-03 (×2): qty 3

## 2022-05-03 NOTE — Progress Notes (Signed)
Mobility Specialist Progress Note:   05/03/22 1419  Mobility  Activity Ambulated with assistance in room  Level of Assistance Standby assist, set-up cues, supervision of patient - no hands on  Assistive Device Front wheel walker  Distance Ambulated (ft) 40 ft  Activity Response Tolerated well  $Mobility charge 1 Mobility   Pt received in chair willing to participate in mobility. No complaints of pain. Required 6L to remain above 88%. Left in chair with call bell in reach and all needs met.   Gareth Eagle Niranjan Rufener Mobility Specialist Please contact via [employer] or  Rehab Office at [phone]

## 2022-05-03 NOTE — Progress Notes (Signed)
   Palliative Medicine Inpatient Follow Up Note HPI: 71 y.o. female  with past medical history of COPD, chronic respiratory failure with hypoxemia, depression, anxiety, GERD, and breast cancer on anastrozole who presented to [facility] ED on 04/09/2022 with shortness of breath. She was admitted with COPD exacerbation and acute on chronic respiratory failure. Hospital course as been prolonged by ongoing hypoxia with difficulty weaning O2 requirements.CT chest 11/20 was negative for PE but showed tracheobrochomalacia. Palliative Medicine was consulted for goals of care in the setting of risk for decompensation and readmission.   Today's Discussion 05/03/2022  *Please note that this is a verbal dictation therefore any spelling or grammatical errors are due to the "[place] One" system interpretation.  Chart reviewed inclusive of vital signs, progress notes, laboratory results, and diagnostic images.   I met with Abigail Castillo this morning. We discussed her O2 requirements and the improvements she had made during her prolonged admission.   We reviewed the MOST form again this morning. Abigail Castillo shares that she does plan to think more about this. I vocalized honesty with her in regards to her frail state and COPD regarding whether or not these interventions would be beneficial for her or not. She shares that she will speak more to her daughter, Abigail Castillo today. She is agreeable to my coming back tomorrow.   I called patients daughter, Abigail Castillo this morning. We reviewed patients current state and the above conversation. She was thankful for this update.   Questions and concerns addressed/Palliative Support Provided.   Objective Assessment: Vital Signs Vitals:   05/03/22 0358 05/03/22 0411  BP: 126/81   Pulse: 76   Resp: 19   Temp: 97.8 F (36.6 C)   SpO2: 100% 100%    Intake/Output Summary (Last 24 hours) at 05/03/2022 0746 Last data filed at 05/03/2022 0117 Gross per 24 hour  Intake 240 ml  Output 1200 ml   Net -960 ml    Last Weight  Most recent update: 04/26/2022  3:19 AM    Weight  67.8 kg (149 lb 7.6 oz)            Gen:  Frail elderly F in NAD HEENT: moist mucous membranes CV: Regular rate and rhythm  PULM: on 3LPM [place], breathing is even and nonlabored ABD: soft/nontender EXT: No edema Neuro: Alert and oriented x3  SUMMARY OF RECOMMENDATIONS   Full code/Continue full scope care --> Ongoing conversations related to this Goal of care is for rehab to improve functional status and ultimately return home Spiritual care completed HCPOA forms on 12/6  Ongoing PMT support  Billing based on MDM: High --> formal review of risks of intervention such as intubation in the setting of COPD and additional co-morbidities ______________________________________________________________________________________ [place] Team Team Cell Phone: [phone] Please utilize secure chat with additional questions, if there is no response within 30 minutes please call the above phone number  Palliative Medicine Team providers are available by phone from 7am to 7pm daily and can be reached through the team cell phone.  Should this patient require assistance outside of these hours, please call the patient's attending physician.

## 2022-05-03 NOTE — Progress Notes (Signed)
  Progress Note   Patient: Abigail Castillo [id number]:[id number] DOB: 1951-04-22 DOA: 04/09/2022     24 DOS: the patient was seen and examined on 05/03/2022   Brief hospital course: 71 year old woman PMH COPD, chronic hypoxic respiratory failure on 2-3 L, moderate pulmonary hypertension presented with shortness of breath.  Seen by pulmonary, treated with bronchodilators, steroids for acute COPD exacerbation complicated by tracheobronchomalacia.  Plan for BiPAP at home.  Planning for SNF once respiratory status improved.  Assessment and Plan: Acute exacerbation of severe COPD Acute chronic hypoxic respiratory failure: CT without complicating features.  Treated with antibiotics.   Started azithromycin for acute on chronic COPD exacerbation>continue prednisone 40 mg with plan to continue long-term until seen at pulmonary office, continue azithromycin azithromycin 3 times weekly for anti-inflammatory, Claritin and Singulair, continue on increased nebulizer and inhaler  Remains stable on 4 L.  Pulmonology concerned patient may not improve enough to safely discharge, however patient seems to be improving and weaning down on discharge to [facility] Monday.   Tracheobronchomalacia Noted on CT during this admission seen by pulmonary, plan is to continue on BiPAP at bedtime and as needed, daytime supplemental oxygen and outpatient pulmonary follow-up. Home BiPAP has been arranged   Anxiety/depression  Stable. Continue olanzapine and Ativan. History of breast cancer status post-lumpectomy:Continue anastrozole   Mobility Level: 4 Movement: 59f Pre Ambulation: SpO2 89-93% 3LO2 During Ambulation: SpO2 77% 4LO2 Post Ambulation: SpO2 64% 4LO2; 90% 4LO2 after 10 minute recovery/pursed lip breathing   Disposition  PT: SNF Pt: SNF      Subjective:  PT: pt tachypneic with SPO2 87% on 4LO2, pt requesting a breathing treatment. NT and RN aware.   Feels better today  Physical Exam: Vitals:   05/03/22 0835  05/03/22 0846 05/03/22 1233 05/03/22 1339  BP:  123/87 127/80   Pulse:  (!) 102 (!) 112   Resp:  20 20   Temp:   98 F (36.7 C)   TempSrc:  Oral Oral   SpO2: 98% 96% 94% 92%  Weight:      Height:       Physical Exam Vitals and nursing note reviewed.  Constitutional:      General: She is not in acute distress.    Appearance: She is ill-appearing (chronically). She is not toxic-appearing.  Cardiovascular:     Rate and Rhythm: Normal rate and regular rhythm.     Heart sounds: No murmur heard. Pulmonary:     Effort: No respiratory distress.     Breath sounds: No wheezing, rhonchi or rales.     Comments: Coarse breath sounds. Mild increased respiratory effort. Neurological:     Mental Status: She is alert.  Psychiatric:        Mood and Affect: Mood normal.        Behavior: Behavior normal.     Data Reviewed: No new data  Family Communication: none  Disposition: Status is: Inpatient Remains inpatient appropriate because: COPD, hypoxia, plan for SNF  Planned Discharge Destination: Skilled nursing facility    Time spent: 20 minutes  Author: DMurray Hodgkins MD 05/03/2022 2:49 PM  For on call review [email or web address]

## 2022-05-04 DIAGNOSIS — Z515 Encounter for palliative care: Secondary | ICD-10-CM | POA: Diagnosis not present

## 2022-05-04 MED ORDER — IPRATROPIUM-ALBUTEROL 0.5-2.5 (3) MG/3ML IN SOLN
3.0000 mL | Freq: Three times a day (TID) | RESPIRATORY_TRACT | Status: DC
  Administered 2022-05-04 – 2022-05-06 (×7): 3 mL via RESPIRATORY_TRACT
  Filled 2022-05-04 (×7): qty 3

## 2022-05-04 MED ORDER — BIOTENE DRY MOUTH MT LIQD: 15.0000 mL | OROMUCOSAL | Status: DC | PRN

## 2022-05-04 NOTE — TOC Progression Note (Signed)
Transition of Care [facility]) - Progression Note    Patient Details  Name: Abigail Castillo MRN: [id number] Date of Birth: Aug 02, 1950  Transition of Care [facility]) CM/SW [place], LCSW Phone Number: 05/04/2022, 12:10 PM  Clinical Narrative:     CSW started pt's auth for a start date of 05/04/22. DC verified by MD for tomorrow. CSW paged PT due to pt needing updated pt notes for authorization because last PT note is on 04/29/2022.  TOC team will continue to assist with discharge planning needs.   Expected Discharge Plan: [place] Barriers to Discharge: Barriers Resolved  Expected Discharge Plan and Services Expected Discharge Plan: [place]   Discharge Planning Services: CM Consult Post Acute Care Choice: Durable Medical Equipment, Home Health Living arrangements for the past 2 months: Mobile Home Expected Discharge Date: 04/25/22               DME Arranged: Nebulizer machine, Oxygen, Bipap DME Agency: [employer] Date DME Agency Contacted: 04/15/22 Time DME Agency Contacted: 1430 Representative spoke with at DME Agency: [place]: PT [place]: [place] Date [place]: 04/18/22 Time [place]: 53 Representative spoke with at [place]: [place] Determinants of Health ([place]) Interventions    Readmission Risk Interventions   No data to display

## 2022-05-04 NOTE — Progress Notes (Signed)
Mobility Specialist Progress Note:   05/04/22 0845  Mobility  Activity Ambulated with assistance in room;Transferred to/from [facility]  Level of Assistance Standby assist, set-up cues, supervision of patient - no hands on  Assistive Device BSC  Distance Ambulated (ft) 6 ft  Activity Response Tolerated well  $Mobility charge 1 Mobility   Pt received on BSC needing to get cleaned up and into chair. No complaints of pain. Left in chair with call bell in reach and all needs met.   Gareth Eagle Tasneem Cormier Mobility Specialist Please contact via [employer] or  Rehab Office at [phone]

## 2022-05-04 NOTE — Progress Notes (Signed)
Palliative Medicine Inpatient Follow Up Note HPI: 71 y.o. female  with past medical history of COPD, chronic respiratory failure with hypoxemia, depression, anxiety, GERD, and breast cancer on anastrozole who presented to [facility] ED on 04/09/2022 with shortness of breath. She was admitted with COPD exacerbation and acute on chronic respiratory failure. Hospital course as been prolonged by ongoing hypoxia with difficulty weaning O2 requirements.CT chest 11/20 was negative for PE but showed tracheobrochomalacia. Palliative Medicine was consulted for goals of care in the setting of risk for decompensation and readmission.   Today's Discussion 05/04/2022  *Please note that this is a verbal dictation therefore any spelling or grammatical errors are due to the "[place] One" system interpretation.  Chart reviewed inclusive of vital signs, progress notes, laboratory results, and diagnostic images.   I met with Abigail Castillo this morning. She shares that she feels she needs a readjustment in the bed. I was able to do this for her in preparation for breakfast - this was set up.   Abigail Castillo and I talked about how she was feeling today. She endorses improvement in breathing though feels that her mouth being dry is troubling. We discussed options such as general oral care and biotene to further help moisten it which she is in favor of. Otherwise she denies nausea or pain this morning.   Further reviewed advanced wishes in terms of resuscitation. She again says that she did not think about this overnight. I shared with her that I realize these are tough and often impossible decisions to make. I shared the importance of making her wishes known to help further guide her family in the future. I again reviewed her co-morbidities and the poor outcomes in a full resuscitation event. She assures me again that she will consider her options and is open to additional conversations in the oncoming days.  We reviewed what her  discharge plan would be inclusive of and how she will require skilled nursing. Explained in detail the role of a SNF:  Skilled care is nursing and therapy care that can only be safely and effectively performed by, or under the supervision of, professionals or Metallurgist. It's health care given when you need skilled nursing or skilled therapy to treat, manage, and observe your condition, and evaluate your care. In a skilled nursing facility you'll receive one or more therapies for an average of one to two hours per day. This includes physical, occupational, and speech therapy. The therapies are not considered intensive.    I again reinforced that the hope of this would be to optimize strength to get Abigail Castillo back to his prior baseline level of functioning.   Questions and concerns addressed/Palliative Support Provided.   Objective Assessment: Vital Signs Vitals:   05/03/22 2330 05/04/22 0404  BP:  113/72  Pulse: 98 99  Resp: 18 20  Temp:  97.9 F (36.6 C)  SpO2:  95%    Intake/Output Summary (Last 24 hours) at 05/04/2022 0738 Last data filed at 05/03/2022 1235 Gross per 24 hour  Intake 240 ml  Output 900 ml  Net -660 ml    Last Weight  Most recent update: 04/26/2022  3:19 AM    Weight  67.8 kg (149 lb 7.6 oz)            Gen:  Frail elderly F in NAD HEENT: moist mucous membranes CV: Irregular rate and rhythm  PULM: on 3LPM [place], course breath sounds ABD: soft/nontender EXT: No edema Neuro: Alert and oriented x3  SUMMARY OF RECOMMENDATIONS   Full code/Continue full scope care --> Ongoing conversations related to this Goal of care --> is for rehab to improve functional status and ultimately return home Spiritual care completed HCPOA forms on 12/6  Will request OP Palliative support on discharge --> given the need for ongoing conversations related to goals of care Ongoing PMT support --> I will not be present tomorrow though my colleague, Royann Shivers will follow up    Billing based on MDM: High --> formal review of risks of intervention such as intubation in the setting of COPD and additional co-morbidities ______________________________________________________________________________________ [place] Team Team Cell Phone: [phone] Please utilize secure chat with additional questions, if there is no response within 30 minutes please call the above phone number  Palliative Medicine Team providers are available by phone from 7am to 7pm daily and can be reached through the team cell phone.  Should this patient require assistance outside of these hours, please call the patient's attending physician.

## 2022-05-04 NOTE — Progress Notes (Signed)
  Progress Note   Patient: Abigail Castillo [id number]:[id number] DOB: 03/25/1951 DOA: 04/09/2022     25 DOS: the patient was seen and examined on 05/04/2022   Brief hospital course: 71 year old woman PMH COPD, chronic hypoxic respiratory failure on 2-3 L, moderate pulmonary hypertension presented with shortness of breath.  Seen by pulmonary, treated with bronchodilators, steroids for acute COPD exacerbation complicated by tracheobronchomalacia.  Plan for BiPAP at home.  Planning for SNF once respiratory status improved.  Assessment and Plan: Acute exacerbation of severe COPD Acute chronic hypoxic respiratory failure: CT without complicating features.  Treated with antibiotics.   Started azithromycin for acute on chronic COPD exacerbation>continue prednisone 40 mg with plan to continue long-term until seen at pulmonary office, continue azithromycin 3 times weekly for anti-inflammatory effect, Claritin and Singulair, continue on increased nebulizer and inhaler  Remains stable on 4 L.  Pulmonology concerned patient may not improve enough to safely discharge, however patient seems to be stabilized and ready for transfer to [facility] Monday.   Tracheobronchomalacia Noted on CT during this admission seen by pulmonary, plan is to continue on BiPAP at bedtime and as needed, daytime supplemental oxygen and outpatient pulmonary follow-up. Home BiPAP has been arranged   Anxiety/depression  Stable. Continue olanzapine and Ativan.  History of breast cancer status post-lumpectomy Continue anastrozole   Mobility Level: 4 Movement: 94f, tolerated well, needed 6L   Disposition  PT: SNF Pt: SNF  GOC Will request OP Palliative support on discharge --> given the need for ongoing conversations related to goals of care  Subjective:  Feels ok Eating ok Breathing was short earlier, but better now Had a BM  Physical Exam: Vitals:   05/04/22 0404 05/04/22 0803 05/04/22 0839 05/04/22 0840  BP: 113/72 (!) 142/82     Pulse: 99 (!) 110    Resp: 20 20    Temp: 97.9 F (36.6 C) 98.1 F (36.7 C)    TempSrc: Oral Oral    SpO2: 95% (!) 89% 93% (!) 89%  Weight:      Height:       Physical Exam Vitals reviewed.  Constitutional:      General: She is not in acute distress.    Appearance: She is ill-appearing (chronically). She is not toxic-appearing.  Cardiovascular:     Rate and Rhythm: Normal rate and regular rhythm.     Heart sounds: No murmur heard. Pulmonary:     Effort: No respiratory distress.     Breath sounds: Wheezing present. No rhonchi or rales.     Comments: Mild increased respiratory effort at rest Neurological:     Mental Status: She is alert.  Psychiatric:        Mood and Affect: Mood normal.        Behavior: Behavior normal.   On 4L  Data Reviewed: No new data  Family Communication: none  Disposition: Status is: Inpatient Remains inpatient appropriate because: COPD  Planned Discharge Destination: Skilled nursing facility    Time spent: 20 minutes  Author: DMurray Hodgkins MD 05/04/2022 9:47 AM  For on call review [email or web address]

## 2022-05-05 MED ORDER — PREDNISONE 20 MG PO TABS
40.0000 mg | ORAL_TABLET | Freq: Every day | ORAL | Status: DC
  Administered 2022-05-06 – 2022-05-12 (×7): 40 mg via ORAL
  Filled 2022-05-05 (×7): qty 2

## 2022-05-05 MED ORDER — FUROSEMIDE 20 MG PO TABS
20.0000 mg | ORAL_TABLET | Freq: Every day | ORAL | Status: DC
  Administered 2022-05-05 – 2022-05-12 (×8): 20 mg via ORAL
  Filled 2022-05-05 (×8): qty 1

## 2022-05-05 MED ORDER — REVEFENACIN 175 MCG/3ML IN SOLN: 175.0000 ug | Freq: Every day | RESPIRATORY_TRACT | Status: DC

## 2022-05-05 MED ORDER — LORATADINE 10 MG PO TABS: 10.0000 mg | ORAL_TABLET | Freq: Every day | ORAL | Status: DC

## 2022-05-05 MED ORDER — IPRATROPIUM-ALBUTEROL 0.5-2.5 (3) MG/3ML IN SOLN: 3.0000 mL | Freq: Four times a day (QID) | RESPIRATORY_TRACT | Status: DC

## 2022-05-05 MED ORDER — LORAZEPAM 1 MG PO TABS: 1.0000 mg | ORAL_TABLET | Freq: Two times a day (BID) | ORAL | 0 refills | Status: DC

## 2022-05-05 MED ORDER — MONTELUKAST SODIUM 10 MG PO TABS: 10.0000 mg | ORAL_TABLET | Freq: Every day | ORAL | Status: DC

## 2022-05-05 MED ORDER — FUROSEMIDE 20 MG PO TABS: 20.0000 mg | ORAL_TABLET | Freq: Every day | ORAL | Status: DC

## 2022-05-05 MED ORDER — PREDNISONE 20 MG PO TABS: 40.0000 mg | ORAL_TABLET | Freq: Every day | ORAL | Status: DC

## 2022-05-05 MED ORDER — AZITHROMYCIN 500 MG PO TABS: 500.0000 mg | ORAL_TABLET | ORAL | 0 refills | Status: DC

## 2022-05-05 NOTE — TOC Progression Note (Signed)
Transition of Care [facility]) - Progression Note    Patient Details  Name: Abigail Castillo MRN: [id number] Date of Birth: 23-Mar-1951  Transition of Care [facility]) CM/SW Contact  Joanne Chars, LCSW Phone Number: 05/05/2022, 10:11 AM  Clinical Narrative:   Clinical info uploaded to [facility]. SNF auth pending.     Expected Discharge Plan: [place] Barriers to Discharge: Barriers Resolved  Expected Discharge Plan and Services Expected Discharge Plan: [place]   Discharge Planning Services: CM Consult Post Acute Care Choice: Durable Medical Equipment, Home Health Living arrangements for the past 2 months: Mobile Home Expected Discharge Date: 04/25/22               DME Arranged: Nebulizer machine, Oxygen, Bipap DME Agency: [employer] Date DME Agency Contacted: 04/15/22 Time DME Agency Contacted: 1430 Representative spoke with at DME Agency: [place]: PT [place]: [place] Date [place]: 04/18/22 Time [place]: 45 Representative spoke with at [place]: [place] Determinants of Health ([place]) Interventions    Readmission Risk Interventions     No data to display

## 2022-05-05 NOTE — Discharge Summary (Addendum)
Physician Discharge Summary   Patient: Abigail Castillo MRN: [id number] DOB: November 11, 1950  Admit date:     04/09/2022  Discharge date: 05/06/22  Discharge Physician: Murray Hodgkins   PCP: [name], NP   Recommendations at discharge:   Acute exacerbation of severe COPD Acute on chronic hypoxic respiratory failure (2-3L as outpatient): --BiPAP QHS and PRN IPAP 10 EPAP 5 Flow rate 3 RR 18  Tracheobronchomalacia   GOC Request OP Palliative support on discharge --> given the need for ongoing conversations related to goals of care  Discharge Diagnoses: Principal Problem:   COPD (chronic obstructive pulmonary disease) (HCC) Active Problems:   Depression   GERD (gastroesophageal reflux disease)   Acute on chronic respiratory failure with hypoxia (HCC)   Ductal carcinoma in situ (DCIS) of left breast   Hyponatremia   Bronchomalacia   Localized edema   COPD exacerbation (HCC)   Tracheobronchomalacia   Acute on chronic respiratory failure with hypoxia and hypercapnia (HCC)  Resolved Problems:   * No resolved hospital problems. *  Hospital Course: 71 year old woman PMH COPD, chronic hypoxic respiratory failure on 2-3 L, moderate pulmonary hypertension presented with shortness of breath.  Seen by pulmonary, treated with bronchodilators, steroids for acute COPD exacerbation complicated by tracheobronchomalacia.  Plan for BiPAP ongoing night.  Slow to improve.  Oxygenation is stable.  Stable for transfer to SNF.  Acute exacerbation of severe COPD Acute on chronic hypoxic respiratory failure (2-3L as outpatient): CT without complicating features.  Treated with antibiotics.   Remains stable on 4 L.  Has follow up with [facility] on 05/09/22  Continue increased dose of steroids-40 mg daily.  Anticipate she will be steroid-dependent moving forward. Please keep her on steroids until she follows up in the office.  Continue azithromycin 3 days/week-plan on this at  discharge Continue Singulair and Claritin.  If her sinus disease is not well-controlled, can add Flonase or azelastine. Continue to yupelri & brovana.  Continue DuoNebs every 4 hours on top of the long-acting meds.  BIPAP QHS and PRN   Tracheobronchomalacia Noted on CT during this admission seen by pulmonary, plan is to continue on BiPAP at bedtime and as needed, daytime supplemental oxygen and outpatient pulmonary follow-up. Home BiPAP has been arranged   Anxiety/depression  Stable. Continue olanzapine and Ativan.   History of breast cancer status post-lumpectomy Continue anastrozole   Mobility Level: 4   Disposition  PT: SNF Pt: SNF   GOC Will request OP Palliative support on discharge --> given the need for ongoing conversations related to goals of care       Consultants:  Pulmonology   Procedures performed:  None    Disposition: Skilled nursing facility Diet recommendation:  Regular diet DISCHARGE MEDICATION: Allergies as of 05/06/2022       Reactions   Sulfonamide Derivatives Hives        Medication List     STOP taking these medications    ibuprofen 800 MG tablet Commonly known as: ADVIL   Stiolto Respimat 2.5-2.5 MCG/ACT Aers Generic drug: Tiotropium Bromide-Olodaterol       TAKE these medications    AeroChamber MV inhaler Use as instructed   albuterol 108 (90 Base) MCG/ACT inhaler Commonly known as: VENTOLIN HFA INHALE TWO PUFFS BY MOUTH INTO THE LUNGS EVERY 6 HOURS AS NEEDED FOR WHEEZING OR SHORTNESS OF BREATH What changed: See the new instructions.   amLODipine 2.5 MG tablet Commonly known as: NORVASC Take 1 tablet (2.5 mg total) by mouth daily.  anastrozole 1 MG tablet Commonly known as: ARIMIDEX TAKE ONE TABLET BY MOUTH DAILY   arformoterol 15 MCG/2ML Nebu Commonly known as: BROVANA Take 2 mLs (15 mcg total) by nebulization 2 (two) times daily.   azithromycin 500 MG tablet Commonly known as: ZITHROMAX Take 1 tablet (500  mg total) by mouth 3 (three) times a week. Start taking on: May 07, 2022   Calcium Carbonate-Vitamin D 600-400 MG-UNIT tablet Take 1 tablet by mouth 2 (two) times daily.   cholecalciferol 1000 units tablet Commonly known as: VITAMIN D Take 3,000 Units by mouth daily.   cyanocobalamin 1000 MCG tablet Commonly known as: VITAMIN B12 TAKE ONE TABLET BY MOUTH DAILY What changed: how much to take   dexlansoprazole 60 MG capsule Commonly known as: DEXILANT TAKE ONE CAPSULE BY MOUTH DAILY   furosemide 20 MG tablet Commonly known as: LASIX Take 1 tablet (20 mg total) by mouth daily.   ipratropium-albuterol 0.5-2.5 (3) MG/3ML Soln Commonly known as: DUONEB Take 3 mLs by nebulization in the morning, at noon, in the evening, and at bedtime.   loratadine 10 MG tablet Commonly known as: CLARITIN Take 1 tablet (10 mg total) by mouth daily.   LORazepam 1 MG tablet Commonly known as: ATIVAN Take 1 tablet (1 mg total) by mouth 2 (two) times daily as needed for anxiety. What changed:  when to take this reasons to take this   montelukast 10 MG tablet Commonly known as: SINGULAIR Take 1 tablet (10 mg total) by mouth at bedtime.   OLANZapine 5 MG tablet Commonly known as: ZYPREXA Take 7.5 mg by mouth daily.   OXYGEN Inhale 3 L into the lungs.   predniSONE 20 MG tablet Commonly known as: DELTASONE Take 2 tablets (40 mg total) by mouth daily with breakfast.   revefenacin 175 MCG/3ML nebulizer solution Commonly known as: YUPELRI Take 3 mLs (175 mcg total) by nebulization daily.   vitamin E 180 MG (400 UNITS) capsule Take 400 Units by mouth daily.       ASK your doctor about these medications    guaiFENesin 600 MG 12 hr tablet Commonly known as: MUCINEX Take 2 tablets (1,200 mg total) by mouth 2 (two) times daily for 7 days. Ask about: Should I take this medication?               Durable Medical Equipment  (From admission, onward)           Start      Ordered   04/25/22 1612  For home use only DME Bipap  Once       Comments: BIPap for tracheomalacia. Cont 10/5, bleed 3-4l/min of oxygen  Question Answer Comment  Length of Need Lifetime   Bleed in oxygen (LPM) 3-4   Inspiratory pressure 10   Expiratory pressure 5      04/25/22 1612   04/16/22 1253  For home use only DME Tub bench  Once        04/16/22 1253   04/15/22 1331  For home use only DME Nebulizer machine  Once       Question Answer Comment  Patient needs a nebulizer to treat with the following condition COPD (chronic obstructive pulmonary disease) ([place])   Length of Need 6 Months      04/15/22 1331            Contact information for follow-up providers     [facility] Follow up.   Specialty: Home Health Services Why: HHPT arranged-  they will contact you to schedule visits ( anticipate start of care tues/wed following discharge) Contact information: [address] [place] [address] [phone]         [name], NP Follow up in 1 week(s).   Specialty: Internal Medicine Contact information: [place] [address] [place] [address] [phone]         Melvenia Needles, NP Follow up on 05/09/2022.   Specialty: Pulmonary Disease Why: 10:00 Contact information: [address] [place] [address] [phone]              Contact information for after-discharge care     Destination     [facility] Preferred SNF .   Service: Skilled Nursing Contact information: [address] [place] [phone]                    Discharge Exam: Filed Weights   04/10/22 1242 04/23/22 0515 04/26/22 0318  Weight: 65 kg 71.4 kg 67.8 kg   Physical Exam Vitals reviewed.      Condition at discharge: fair  The results of significant diagnostics from this hospitalization (including imaging, microbiology, ancillary and laboratory) are  listed below for reference.   Imaging Studies: DG CHEST PORT 1 VIEW  Result Date: 04/29/2022 CLINICAL DATA:  Abnormal respiration. EXAM: PORTABLE CHEST 1 VIEW COMPARISON:  Chest radiograph dated 04/25/2022. FINDINGS: Background of emphysema. There is shallow inspiration with bibasilar atelectasis. No focal consolidation, pleural effusion, or pneumothorax. The cardiac silhouette is within limits. Multiple surgical clips over the left chest wall. No acute osseous pathology. IMPRESSION: 1. No active disease. 2. Emphysema. Electronically Signed   By: Anner Crete M.D.   On: 04/29/2022 19:15   ECHOCARDIOGRAM COMPLETE BUBBLE STUDY  Result Date: 04/26/2022    ECHOCARDIOGRAM REPORT   Patient Name:   Clydine RIYAH BARDON Date of Exam: 04/26/2022 Medical Rec #:  [id number]      Height:       62.0 in Accession #:    [id number]     Weight:       149.5 lb Date of Birth:  1951-01-02     BSA:          1.689 m Patient Age:    7 years       BP:           128/81 mmHg Patient Gender: F              HR:           106 bpm. Exam Location:  Inpatient Procedure: 2D Echo, Cardiac Doppler, Color Doppler and Intracardiac            Opacification Agent STAT ECHO Indications:    Hypoxia [[id number]]  History:        Patient has no prior history of Echocardiogram examinations.                 COPD, Signs/Symptoms:Shortness of Breath; Risk Factors:Current                 Smoker. Emphysema of lung. GERD.  Sonographer:    Darlina Sicilian RDCS Referring Phys: [id number] Julian Hy  Sonographer Comments: Echo performed with patient sitting upright in chair. IMPRESSIONS  1. Left ventricular ejection fraction, by estimation, is 70 to 75%. The left ventricle has hyperdynamic function. The left ventricle has no regional wall motion abnormalities. There is mild asymmetric  left ventricular hypertrophy of the basal-septal segment. Left ventricular diastolic parameters are consistent with Grade I diastolic dysfunction (impaired relaxation).  2. Right  ventricular systolic function is normal. The right ventricular size is normal. There is mildly elevated pulmonary artery systolic pressure. The estimated right ventricular systolic pressure is 89.2 mmHg.  3. The mitral valve is grossly normal. No evidence of mitral valve regurgitation. No evidence of mitral stenosis.  4. The aortic valve is grossly normal. Aortic valve regurgitation is not visualized. No aortic stenosis is present.  5. The inferior vena cava is normal in size with greater than 50% respiratory variability, suggesting right atrial pressure of 3 mmHg.  6. Agitated saline contrast bubble study was negative, with no evidence of any interatrial shunt. FINDINGS  Left Ventricle: Left ventricular ejection fraction, by estimation, is 70 to 75%. The left ventricle has hyperdynamic function. The left ventricle has no regional wall motion abnormalities. The left ventricular internal cavity size was small. There is mild asymmetric left ventricular hypertrophy of the basal-septal segment. Left ventricular diastolic parameters are consistent with Grade I diastolic dysfunction (impaired relaxation). Right Ventricle: The right ventricular size is normal. No increase in right ventricular wall thickness. Right ventricular systolic function is normal. There is mildly elevated pulmonary artery systolic pressure. The tricuspid regurgitant velocity is 3.05  m/s, and with an assumed right atrial pressure of 3 mmHg, the estimated right ventricular systolic pressure is 11.9 mmHg. Left Atrium: Left atrial size was normal in size. Right Atrium: Right atrial size was normal in size. Pericardium: There is no evidence of pericardial effusion. Presence of epicardial fat layer. Mitral Valve: The mitral valve is grossly normal. No evidence of mitral valve regurgitation. No evidence of mitral valve stenosis. Tricuspid Valve: The tricuspid valve is grossly normal. Tricuspid valve regurgitation is trivial. No evidence of tricuspid  stenosis. Aortic Valve: The aortic valve is grossly normal. Aortic valve regurgitation is not visualized. No aortic stenosis is present. Pulmonic Valve: The pulmonic valve was not well visualized. Pulmonic valve regurgitation is not visualized. No evidence of pulmonic stenosis. Aorta: The aortic root is normal in size and structure. Venous: The inferior vena cava is normal in size with greater than 50% respiratory variability, suggesting right atrial pressure of 3 mmHg. IAS/Shunts: No atrial level shunt detected by color flow Doppler. Agitated saline contrast was given intravenously to evaluate for intracardiac shunting. Agitated saline contrast bubble study was negative, with no evidence of any interatrial shunt.  LEFT VENTRICLE PLAX 2D LVIDd:         3.60 cm   Diastology LVIDs:         1.80 cm   LV e' medial:    9.19 cm/s LV PW:         0.70 cm   LV E/e' medial:  5.1 LV IVS:        1.10 cm   LV e' lateral:   11.00 cm/s LVOT diam:     1.80 cm   LV E/e' lateral: 4.3 LV SV:         49 LV SV Index:   29 LVOT Area:     2.54 cm  RIGHT VENTRICLE RV Basal diam:  3.40 cm RV Mid diam:    2.60 cm LEFT ATRIUM             Index LA diam:        2.70 cm 1.60 cm/m LA Vol (A2C):   15.5 ml 9.18 ml/m LA Vol (A4C):   10.8  ml 6.39 ml/m LA Biplane Vol: 13.2 ml 7.81 ml/m  AORTIC VALVE LVOT Vmax:   122.00 cm/s LVOT Vmean:  98.200 cm/s LVOT VTI:    0.193 m  AORTA Ao Root diam: 2.80 cm MITRAL VALVE               TRICUSPID VALVE MV Area (PHT): 2.42 cm    TR Peak grad:   37.2 mmHg MV Decel Time: 314 msec    TR Vmax:        305.00 cm/s MV E velocity: 46.90 cm/s MV A velocity: 81.80 cm/s  SHUNTS MV E/A ratio:  0.57        Systemic VTI:  0.19 m                            Systemic Diam: 1.80 cm Candee Furbish MD Electronically signed by Candee Furbish MD Signature Date/Time: 04/26/2022/5:13:38 PM    Final    DG Chest Port 1 View  Result Date: 04/25/2022 CLINICAL DATA:  History of COPD.  Shortness of breath EXAM: PORTABLE CHEST 1 VIEW  COMPARISON:  04/13/2022 FINDINGS: Redemonstrated findings of severe bolus emphysema. Unchanged cardiac and mediastinal contours. No pneumothorax. Persistent bibasilar reticular opacities, unchanged from prior exam. No new focal airspace opacities visualized. Visualized upper abdomen is unremarkable. No displaced rib fracture. IMPRESSION: Redemonstrated findings of severe bolus emphysema. No new focal airspace opacities visualized. Electronically Signed   By: Marin Roberts M.D.   On: 04/25/2022 10:56   CT Angio Chest Pulmonary Embolism (PE) W or WO Contrast  Result Date: 04/14/2022 CLINICAL DATA:  Evaluate for acute pulmonary embolus. EXAM: CT ANGIOGRAPHY CHEST WITH CONTRAST TECHNIQUE: Multidetector CT imaging of the chest was performed using the standard protocol during bolus administration of intravenous contrast. Multiplanar CT image reconstructions and MIPs were obtained to evaluate the vascular anatomy. RADIATION DOSE REDUCTION: This exam was performed according to the departmental dose-optimization program which includes automated exposure control, adjustment of the mA and/or kV according to patient size and/or use of iterative reconstruction technique. CONTRAST:  9m OMNIPAQUE IOHEXOL 350 MG/ML SOLN COMPARISON:  04/17/2010 FINDINGS: Cardiovascular: Satisfactory opacification of the pulmonary arteries to the segmental level. No evidence of pulmonary embolism. Aortic atherosclerosis. Coronary artery calcifications. Normal heart size. No pericardial effusion. Mediastinum/Nodes: Thyroid gland, and esophagus appear unremarkable. There is decreased AP diameter of the trachea and bilateral mainstem bronchi concerning for chronic tracheobronchomalacia. No enlarged axillary, mediastinal, or hilar lymph nodes. Lungs/Pleura: Severe changes of emphysema. No pleural effusion. No airspace consolidation. Subpleural scarring noted within the posterolateral right middle lobe. Upper Abdomen: No acute findings. Small  hiatal hernia. Status post cholecystectomy. Musculoskeletal: No acute or suspicious osseous findings. Mild chronic endplate deformities noted throughout the thoracic spine. Review of the MIP images confirms the above findings. IMPRESSION: 1. No evidence for acute pulmonary embolism. 2. Decreased AP diameter of the trachea and bilateral mainstem bronchi concerning for chronic tracheobronchomalacia. 3. Diffuse bronchial wall thickening with severe emphysema, as above; imaging findings suggestive of underlying COPD. Emphysema (ICD10-J43.9). 4. Coronary artery calcifications. 5. Small hiatal hernia. 6.  Aortic Atherosclerosis (ICD10-I70.0). Electronically Signed   By: TKerby MoorsM.D.   On: 04/14/2022 10:24   DG CHEST PORT 1 VIEW  Result Date: 04/13/2022 CLINICAL DATA:  Hypoxemia, dry throat EXAM: PORTABLE CHEST 1 VIEW COMPARISON:  Prior chest x-ray 04/09/2022 FINDINGS: Stable cardiac and mediastinal contours. Severe bullous emphysematous changes in both mid and upper lungs again noted. Mild  chronic interstitial prominence in the lung bases. No pneumothorax or pleural effusion. No significant interval change. Surgical clips project over the left chest. IMPRESSION: Stable appearance of the chest with the out evidence of acute cardiopulmonary process. Electronically Signed   By: Jacqulynn Cadet M.D.   On: 04/13/2022 07:35   DG Chest Port 1 View  Result Date: 04/09/2022 CLINICAL DATA:  Dyspnea, history breast cancer, COPD, emphysema, GERD, hepatic steatosis EXAM: PORTABLE CHEST 1 VIEW COMPARISON:  Portable exam 0750 hours compared to 07/05/2019 FINDINGS: Normal heart size and mediastinal contours. Prominent central pulmonary arteries question pulmonary arterial hypertension. Severe emphysematous changes, greater on RIGHT. Bibasilar chronic interstitial lung disease changes again seen. No acute infiltrate, pleural effusion, or pneumothorax. Diffuse osseous demineralization. IMPRESSION: COPD changes with  severe bibasilar chronic interstitial lung disease. Question pulmonary arterial hypertension. No acute abnormalities or interval change. Electronically Signed   By: Lavonia Dana M.D.   On: 04/09/2022 07:58    Microbiology: Results for orders placed or performed during the hospital encounter of 04/09/22  Resp Panel by RT-PCR (Flu A&B, Covid) Anterior Nasal Swab     Status: None   Collection Time: 04/09/22  7:45 AM   Specimen: Anterior Nasal Swab  Result Value Ref Range Status   SARS Coronavirus 2 by RT PCR NEGATIVE NEGATIVE Final    Comment: (NOTE) SARS-CoV-2 target nucleic acids are NOT DETECTED.  The SARS-CoV-2 RNA is generally detectable in upper respiratory specimens during the acute phase of infection. The lowest concentration of SARS-CoV-2 viral copies this assay can detect is 138 copies/mL. A negative result does not preclude SARS-Cov-2 infection and should not be used as the sole basis for treatment or other patient management decisions. A negative result may occur with  improper specimen collection/handling, submission of specimen other than nasopharyngeal swab, presence of viral mutation(s) within the areas targeted by this assay, and inadequate number of viral copies(<138 copies/mL). A negative result must be combined with clinical observations, patient history, and epidemiological information. The expected result is Negative.  Fact Sheet for Patients:  EntrepreneurPulse.com.au  Fact Sheet for Healthcare Providers:  IncredibleEmployment.be  This test is no t yet approved or cleared by the [place] FDA and  has been authorized for detection and/or diagnosis of SARS-CoV-2 by FDA under an Emergency Use Authorization (EUA). This EUA will remain  in effect (meaning this test can be used) for the duration of the COVID-19 declaration under Section 564(b)(1) of the Act, 21 U.S.C.section 360bbb-3(b)(1), unless the authorization is terminated   or revoked sooner.       Influenza A by PCR NEGATIVE NEGATIVE Final   Influenza B by PCR NEGATIVE NEGATIVE Final    Comment: (NOTE) The Xpert Xpress SARS-CoV-2/FLU/RSV plus assay is intended as an aid in the diagnosis of influenza from Nasopharyngeal swab specimens and should not be used as a sole basis for treatment. Nasal washings and aspirates are unacceptable for Xpert Xpress SARS-CoV-2/FLU/RSV testing.  Fact Sheet for Patients: EntrepreneurPulse.com.au  Fact Sheet for Healthcare Providers: IncredibleEmployment.be  This test is not yet approved or cleared by the [place] FDA and has been authorized for detection and/or diagnosis of SARS-CoV-2 by FDA under an Emergency Use Authorization (EUA). This EUA will remain in effect (meaning this test can be used) for the duration of the COVID-19 declaration under Section 564(b)(1) of the Act, 21 U.S.C. section 360bbb-3(b)(1), unless the authorization is terminated or revoked.  Performed at [facility], [place] [address], [place], [place] [address]  Labs: CBC: Recent Labs  Lab 04/29/22 1422 04/30/22 0134  WBC 12.3* 12.6*  HGB 12.9 12.0  HCT 39.9 36.7  MCV 97.1 96.1  PLT 196 012   Basic Metabolic Panel: Recent Labs  Lab 04/29/22 1422 04/30/22 0134  NA 136 137  K 4.6 4.2  CL 96* 96*  CO2 30 33*  GLUCOSE 225* 125*  BUN 19 20  CREATININE 0.88 0.75  CALCIUM 8.8* 9.3   Liver Function Tests: No results for input(s): "AST", "ALT", "ALKPHOS", "BILITOT", "PROT", "ALBUMIN" in the last 168 hours. CBG: No results for input(s): "GLUCAP" in the last 168 hours.  Discharge time spent: greater than 30 minutes.  Signed: Murray Hodgkins, MD [facility] 05/06/2022

## 2022-05-05 NOTE — Progress Notes (Signed)
Physical Therapy Treatment Patient Details Name: Abigail Castillo MRN: [id number] DOB: 11/17/1950 Today's Date: 05/05/2022   History of Present Illness Pt is a 71 y.o. F who presents 04/09/2022 with COPD with acute exacerbation. Significant PMH: COPD, chronic respiratory failure with hypoxemia, COVID-19, osteoporosis, tobacco abuse.    PT Comments    Pt progressing incrementally towards her physical therapy goals; demonstrates improved oxygen requirement at rest and with activity. Pt ambulating room distances, ~30 ft with a RW at a supervision level. Desat to 85% on 3L O2; required ~60s seated rest break to rebound to 88-90%. Continues to require cueing for self monitoring symptoms and for pursed lip breathing. Pt requesting 3rd breathing treatment at end of session. Will benefit from [facility] to address deficits and maximize functional mobility.     Recommendations for follow up therapy are one component of a multi-disciplinary discharge planning process, led by the attending physician.  Recommendations may be updated based on patient status, additional functional criteria and insurance authorization.  Follow Up Recommendations  Skilled nursing-short term rehab (<3 hours/day) Can patient physically be transported by private vehicle: Yes   Assistance Recommended at Discharge Set up Supervision/Assistance  Patient can return home with the following Assistance with cooking/housework;Assist for transportation;Help with stairs or ramp for entrance;A little help with walking and/or transfers;A little help with bathing/dressing/bathroom   Equipment Recommendations  None recommended by PT    Recommendations for Other Services       Precautions / Restrictions Precautions Precautions: Fall;Other (comment) Precaution Comments: watch O2 Restrictions Weight Bearing Restrictions: No     Mobility  Bed Mobility Overal bed mobility: Modified Independent                   Transfers Overall transfer level: Needs assistance Equipment used: None, Rolling walker (2 wheels) Transfers: Sit to/from Stand, Bed to chair/wheelchair/BSC Sit to Stand: Supervision Stand pivot transfers: Supervision         General transfer comment: Supervision for safety    Ambulation/Gait Ambulation/Gait assistance: Supervision Gait Distance (Feet): 30 Feet Assistive device: Rolling walker (2 wheels) Gait Pattern/deviations: Step-through pattern, Decreased stride length Gait velocity: decreased     General Gait Details: No overt LOB, assist for line management   Stairs             Wheelchair Mobility    Modified Rankin (Stroke Patients Only)       Balance Overall balance assessment: Needs assistance Sitting-balance support: Feet supported, No upper extremity supported Sitting balance-Leahy Scale: Good     Standing balance support: During functional activity Standing balance-Leahy Scale: Fair                              Cognition Arousal/Alertness: Awake/alert Behavior During Therapy: Flat affect Overall Cognitive Status: No family/caregiver present to determine baseline cognitive functioning                                 General Comments: Slow processing/response time        Exercises General Exercises - Lower Extremity Long Arc Quad: Both, 10 reps, Seated    General Comments        Pertinent Vitals/Pain Pain Assessment Pain Assessment: Faces Faces Pain Scale: No hurt    Home Living  Prior Function            PT Goals (current goals can now be found in the care plan section) Acute Rehab PT Goals Patient Stated Goal: to improve breathing, go to rehab Potential to Achieve Goals: Good Progress towards PT goals: Progressing toward goals    Frequency    Min 2X/week      PT Plan Current plan remains appropriate    Co-evaluation              AM-PAC PT  "6 Clicks" Mobility   Outcome Measure  Help needed turning from your back to your side while in a flat bed without using bedrails?: None Help needed moving from lying on your back to sitting on the side of a flat bed without using bedrails?: None Help needed moving to and from a bed to a chair (including a wheelchair)?: A Little Help needed standing up from a chair using your arms (e.g., wheelchair or bedside chair)?: A Little Help needed to walk in hospital room?: A Little Help needed climbing 3-5 steps with a railing? : A Lot 6 Click Score: 19    End of Session Equipment Utilized During Treatment: Oxygen Activity Tolerance: Patient tolerated treatment well Patient left: in chair;with call bell/phone within reach;with chair alarm set Nurse Communication: Mobility status PT Visit Diagnosis: Difficulty in walking, not elsewhere classified (R26.2);Unsteadiness on feet (R26.81);Other abnormalities of gait and mobility (R26.89)     Time: 9509-3267 PT Time Calculation (min) (ACUTE ONLY): 23 min  Charges:  $Therapeutic Activity: 23-37 mins                     Wyona Almas, PT, DPT Acute Rehabilitation Services Office [phone] -   Deno Etienne 05/05/2022, 9:31 AM

## 2022-05-06 ENCOUNTER — Other Ambulatory Visit (HOSPITAL_COMMUNITY): Payer: Self-pay

## 2022-05-06 MED ORDER — ALBUTEROL SULFATE (2.5 MG/3ML) 0.083% IN NEBU
2.5000 mg | INHALATION_SOLUTION | RESPIRATORY_TRACT | Status: DC | PRN
  Administered 2022-05-06 – 2022-05-12 (×10): 2.5 mg via RESPIRATORY_TRACT
  Filled 2022-05-06 (×10): qty 3

## 2022-05-06 MED ORDER — LORAZEPAM 1 MG PO TABS: 1.0000 mg | ORAL_TABLET | Freq: Two times a day (BID) | ORAL | 0 refills | Status: DC | PRN

## 2022-05-06 MED ORDER — ALBUTEROL SULFATE (2.5 MG/3ML) 0.083% IN NEBU
2.5000 mg | INHALATION_SOLUTION | Freq: Once | RESPIRATORY_TRACT | Status: AC
  Administered 2022-05-06: 2.5 mg via RESPIRATORY_TRACT
  Filled 2022-05-06: qty 3

## 2022-05-06 MED ORDER — ARFORMOTEROL TARTRATE 15 MCG/2ML IN NEBU: 15.0000 ug | INHALATION_SOLUTION | Freq: Two times a day (BID) | RESPIRATORY_TRACT | Status: DC

## 2022-05-06 NOTE — TOC Transition Note (Signed)
Transition of Care [facility]) - CM/SW Discharge Note   Patient Details  Name: Abigail Castillo MRN: [id number] Date of Birth: [birth year]  Transition of Care [facility]) CM/SW Contact:  Bethann Berkshire, [place] Phone Number: 05/06/2022, 12:17 PM   Clinical Narrative:     Josem Kaufmann approved 12/11- 12/13. Auth# [id number] Helene Kelp has BIPAP arranged at facility for pt.   CSW discuss OP palliative with pt; [facility] uses [facility] for OP palliative which pt is agreeable to. CSW made OP palliative referral to [facility].   Patient will DC to: [facility] Anticipated DC date: 05/06/22 Family notified: Daughter Lobbyist by: Corey Harold   Per MD patient ready for DC to [facility]. RN, patient, patient's family, and facility notified of DC. Discharge Summary and FL2 sent to facility. RN to call report prior to discharge [phone] Room 123). DC packet on chart. Ambulance transport requested for patient.   CSW will sign off for now as social work intervention is no longer needed. Please consult [place] again if new needs arise.   Final next level of care: Skilled Nursing Facility Barriers to Discharge: No Barriers Identified   Patient Goals and CMS Choice Patient states their goals for this hospitalization and ongoing recovery are:: return home   Choice offered to / list presented to : Patient  Discharge Placement              Patient chooses bed at: [facility] Patient to be transferred to facility by: [place] Name of family member notified: daughter Candice Patient and family notified of of transfer: 05/06/22  Discharge Plan and Services   Discharge Planning Services: CM Consult Post Acute Care Choice: Durable Medical Equipment, Home Health          DME Arranged: Nebulizer machine, Oxygen, Bipap DME Agency: [employer] Date DME Agency Contacted: 04/15/22 Time DME Agency Contacted: 2263 Representative spoke with at DME Agency: Erasmo Downer HH Arranged: PT [place] Agency:  [place] Date [place]: 04/18/22 Time HH Agency Contacted: 26 Representative spoke with at [place]: [place] Determinants of Health ([place]) Interventions     Readmission Risk Interventions     No data to display

## 2022-05-06 NOTE — Progress Notes (Signed)
Pt requiring more o2 and breathing treatments. DC cancelled. CSW notified SNF and pt's daughter.

## 2022-05-06 NOTE — Progress Notes (Signed)
Pt's 02 sat dropped to 80% at 3L. Pt experienced SOB. Changed pt to HFNC at 8L. Notified MD and RT.   Lavenia Atlas, RN

## 2022-05-06 NOTE — Progress Notes (Addendum)
Progress Note   Patient: Abigail Castillo:[id number] DOB: [birth year] DOA: 04/09/2022     27 DOS: the patient was seen and examined on 05/06/2022   Brief hospital course: 71 year old woman PMH COPD, chronic hypoxic respiratory failure on 2-3 L, moderate pulmonary hypertension presented with shortness of breath.  Seen by pulmonary, treated with bronchodilators, steroids for acute COPD exacerbation complicated by tracheobronchomalacia.  Plan for BiPAP at home.  Planning for SNF once respiratory status improved. 11/15 admit for COPD exacerbation 11/16 pulmonology consult 12/1 per PCCM advised to discharge on BIPAP QHS and PRN for shortness of breath 10/5, Bleed in 3-4 L of oxygen/min, f/u at pulm office. Discharge planned, but decompensated, PCCM re-involved. 12/2 COPD with acute exacerbation, seems recurrent after reducing steroid dose.  12/2-12/12 slow improvement and now stable for discharge  Assessment and Plan: Acute exacerbation of severe COPD Acute on chronic hypoxic respiratory failure (2-3L as outpatient): CT without complicating features.  Treated with antibiotics.   Remains stable on 4 L.  Has follow up with [facility] on 05/09/22  Continue increased dose of steroids-40 mg daily.  Anticipate Abigail Castillo will be steroid-dependent moving forward. Please keep her on steroids until Abigail Castillo follows up in the office.  Continue azithromycin 3 days/week-plan on this at discharge Continue Singulair and Claritin.  If her sinus disease is not well-controlled, can add Flonase or azelastine. Continue to yupelri & brovana.  Continue DuoNebs every 4 hours on top of the long-acting meds.  BIPAP QHS and PRN   Tracheobronchomalacia Noted on CT during this admission seen by pulmonary, plan is to continue on BiPAP at bedtime and as needed, daytime supplemental oxygen and outpatient pulmonary follow-up. Home BiPAP has been arranged   Anxiety/depression  Stable. Continue olanzapine and Ativan.   History of  breast cancer status post-lumpectomy Continue anastrozole   Mobility Level: 4   Disposition  PT: SNF Pt: SNF   GOC Will request OP Palliative support on discharge --> given the need for ongoing conversations related to goals of care  ADDENDUM: became SOB, desatted, oxygen requirment now up to 8L. Cancel discharge and monitor.     Subjective:  Feels ok today Breathing ok now  Physical Exam: Vitals:   05/06/22 0448 05/06/22 0824 05/06/22 0825 05/06/22 0905  BP: 118/65   (!) 110/50  Pulse: 89   (!) 109  Resp: 16   19  Temp: 97.9 F (36.6 C)   98.5 F (36.9 C)  TempSrc: Oral   Oral  SpO2: 98% 94% 95% 96%  Weight:      Height:       Physical Exam Vitals reviewed.  Constitutional:      General: Abigail Castillo is not in acute distress.    Appearance: Abigail Castillo is ill-appearing (chronic). Abigail Castillo is not toxic-appearing.  Cardiovascular:     Rate and Rhythm: Normal rate and regular rhythm.     Heart sounds: No murmur heard. Pulmonary:     Effort: No respiratory distress.     Breath sounds: No wheezing, rhonchi or rales.     Comments: Respiratory effort mildly increased, but appears to be patient's baseline Neurological:     Mental Status: Abigail Castillo is alert.  Psychiatric:        Mood and Affect: Mood normal.        Behavior: Behavior normal.     Data Reviewed: No new data  Family Communication: none  Disposition: Status is: Inpatient   Planned Discharge Destination: Skilled nursing facility    Time spent:  20 minutes  Author: Murray Hodgkins, MD 05/06/2022 9:10 AM  For on call review [email or web address].

## 2022-05-06 NOTE — Progress Notes (Signed)
[facility]) Hospital Liaison note:  Notified by Ardeen Fillers of request for [place] services at [facility]. Will continue to follow for disposition.  Please call with any outpatient palliative questions or concerns.  Thank you for the opportunity to participate in this patient's care.  Thank you, Lorelee Market, LPN [facility] Liaison [phone]

## 2022-05-07 NOTE — Progress Notes (Signed)
PROGRESS NOTE   Abigail Castillo  MYT:[id number]    DOB: Dec 01, 1950    DOA: 04/09/2022  PCP: [name], NP   I have briefly reviewed patients previous medical [facility] in [facility].  Chief Complaint  Patient presents with   Cough   Shortness of Breath    Brief Narrative:   71 year old woman PMH COPD, chronic hypoxic respiratory failure on 2-3 L, moderate pulmonary hypertension presented with shortness of breath.  Seen by pulmonary, treated with bronchodilators, steroids for acute COPD exacerbation complicated by tracheobronchomalacia.  Plan for BiPAP at home.  Planning for SNF once respiratory status improved. 11/15 admit for COPD exacerbation 11/16 pulmonology consult 12/1 per PCCM advised to discharge on BIPAP QHS and PRN for shortness of breath 10/5, Bleed in 3-4 L of oxygen/min, f/u at pulm office. Discharge planned, but decompensated, PCCM re-involved. 12/2 COPD with acute exacerbation, seems recurrent after reducing steroid dose.  12/2-12/12 slow improvement but became acutely short of breath, desaturated, oxygen requirement went up to 8 L/min HFNC on 12/12 holding potential discharge. Chronic tenuous respiratory status which may be close to her baseline.  Awaiting TOC input regarding DC back to SNF.   Assessment & Plan:  Principal Problem:   COPD (chronic obstructive pulmonary disease) (HCC) Active Problems:   Depression   GERD (gastroesophageal reflux disease)   Acute on chronic respiratory failure with hypoxia (HCC)   Ductal carcinoma in situ (DCIS) of left breast   Hyponatremia   Bronchomalacia   Localized edema   COPD exacerbation (HCC)   Tracheobronchomalacia   Acute on chronic respiratory failure with hypoxia and hypercapnia (HCC)   Acute exacerbation of severe COPD Acute on chronic hypoxic respiratory failure (2-3L as outpatient): CT without complicating features.  Treated with antibiotics.   Has follow up with [facility] on 05/09/22.  This  may need to be rescheduled since she is still in the hospital. Continue increased dose of steroids-40 mg daily.  Anticipate she will be steroid-dependent moving forward. Please keep her on steroids until she follows up in the office.  Continue azithromycin 3 days/week-plan on this at discharge Continue Singulair and Claritin.  If her sinus disease is not well-controlled, can add Flonase or azelastine. Continue to yupelri & brovana.  Continue DuoNebs every 4 hours on top of the long-acting meds.  BIPAP QHS and PRN On 12/12, she got acutely dyspneic, desaturated with oxygen requirement up to 8 L/min HFNC.  Has been weaned down to 3 L/min [place] oxygen.  Per discussion with nursing, minimal activity precipitate dyspnea. Palliative care input 12/10 appreciated: Full code, full scope of care but need to have ongoing discussions regarding goals of care given her extremely poor prognosis.  Outpatient palliative care follow-up for ongoing conversations regarding [place].   Tracheobronchomalacia Noted on CT during this admission seen by pulmonary, plan is to continue on BiPAP at bedtime and as needed, daytime supplemental oxygen and outpatient pulmonary follow-up. Home BiPAP has been arranged  Hypertension: Controlled on amlodipine.   Anxiety/depression  Stable. Continue olanzapine and Ativan.   History of breast cancer status post-lumpectomy Continue anastrozole   GOC Will request OP Palliative support on discharge --> given the need for ongoing conversations related to goals of care  Body mass index is 27.34 kg/m.   DVT prophylaxis: enoxaparin (LOVENOX) injection 40 mg Start: 04/09/22 1215     Code Status: Full Code:  Family Communication: None at bedside Disposition:  Status is: Inpatient Remains inpatient appropriate because: Ongoing dyspnea with  minimal exertion.  This may be her optimized state at this time.  Awaiting to hear back from [facility] regarding DC back to SNF.     Consultants:    Palliative care medicine PCCM  Procedures:     Antimicrobials:      Subjective:  Seen this morning.  Was just transitioning from bedside commode to bed after having a good BM.  Brown stools noted in commode.  Mild to moderately dyspneic with exertion.  As per discussion with RN, gets dyspneic with minimal activity such as even speaking on the phone, getting to bedside commode etc.  And she then asked for breathing treatments.  Objective:   Vitals:   05/07/22 0345 05/07/22 0814 05/07/22 1255 05/07/22 1259  BP: 105/81  (!) 83/56 100/62  Pulse: 82 99 (!) 114 (!) 114  Resp: '20 18 20 20  '$ Temp: (!) 97.4 F (36.3 C)     TempSrc: Axillary     SpO2: 92% 96% 98% 98%  Weight:      Height:        General exam: Elderly female, moderately built and nourished, with mild increased work of breathing with activity as noted above. Respiratory system: Distant breath sounds but essentially clear to auscultation without wheezing, rhonchi or crackles. Cardiovascular system: S1 & S2 heard, RRR. No JVD, murmurs, rubs, gallops or clicks. No pedal edema.  Not on telemetry. Gastrointestinal system: Abdomen is nondistended, soft and nontender. No organomegaly or masses felt. Normal bowel sounds heard. Central nervous system: Alert and oriented. No focal neurological deficits. Extremities: Symmetric 5 x 5 power. Skin: No rashes, lesions or ulcers Psychiatry: Judgement and insight appear normal. Mood & affect appropriate.     Data Reviewed:   I have personally reviewed following labs and imaging studies   CBC: No results for input(s): "WBC", "NEUTROABS", "HGB", "HCT", "MCV", "PLT" in the last 168 hours.  Basic Metabolic Panel: No results for input(s): "NA", "K", "CL", "CO2", "GLUCOSE", "BUN", "CREATININE", "CALCIUM", "MG", "PHOS" in the last 168 hours.  Liver Function Tests: No results for input(s): "AST", "ALT", "ALKPHOS", "BILITOT", "PROT", "ALBUMIN" in the last 168 hours.  CBG: No results  for input(s): "GLUCAP" in the last 168 hours.  Microbiology Studies:  No results found for this or any previous visit (from the past 240 hour(s)).  Radiology Studies:  No results found.  Scheduled Meds:    amLODipine  2.5 mg Oral Daily   anastrozole  1 mg Oral Daily   arformoterol  15 mcg Nebulization BID   azithromycin  500 mg Oral Once per day on Mon Wed Fri   enoxaparin (LOVENOX) injection  40 mg Subcutaneous Q24H   furosemide  20 mg Oral Daily   guaiFENesin  1,200 mg Oral BID   lidocaine  1 patch Transdermal Daily   loratadine  10 mg Oral Daily   montelukast  10 mg Oral QHS   OLANZapine  7.5 mg Oral Daily   pantoprazole  40 mg Oral BID   polyethylene glycol  17 g Oral BID   predniSONE  40 mg Oral Q breakfast   revefenacin  175 mcg Nebulization Daily   senna-docusate  1 tablet Oral BID    Continuous Infusions:     LOS: 28 days     Vernell Leep, MD,  FACP, FHM, SFHM, [facility], [place]     To contact the attending provider between 7A-7P or the covering provider during after hours 7P-7A, please log  into the web site [email or web address] and access using universal [place] password for that web site. If you do not have the password, please call the hospital operator.  05/07/2022, 4:55 PM

## 2022-05-07 NOTE — Plan of Care (Signed)
  Problem: Education: Goal: Knowledge of disease or condition will improve Outcome: Progressing Goal: Knowledge of the prescribed therapeutic regimen will improve Outcome: Progressing   Problem: Activity: Goal: Ability to tolerate increased activity will improve Outcome: Progressing Goal: Will verbalize the importance of balancing activity with adequate rest periods Outcome: Progressing   Problem: Respiratory: Goal: Ability to maintain a clear airway will improve Outcome: Progressing Goal: Levels of oxygenation will improve Outcome: Progressing Goal: Ability to maintain adequate ventilation will improve Outcome: Progressing   Problem: Safety: Goal: Ability to remain free from injury will improve Outcome: Progressing   Problem: Pain Managment: Goal: General experience of comfort will improve Outcome: Progressing

## 2022-05-07 NOTE — Progress Notes (Signed)
Mobility Specialist Progress Note:   05/07/22 1239  Mobility  Activity Ambulated with assistance in room  Level of Assistance Modified independent, requires aide device or extra time  Assistive Device Front wheel walker  Distance Ambulated (ft) 40 ft  Activity Response Tolerated well  $Mobility charge 1 Mobility   Pt received in chair willing to participate in mobility. No complaints of pain. Left in chair with call bell in reach and all needs met.   Abigail Castillo Mobility Specialist Please contact via [employer] or  Rehab Office at [phone]

## 2022-05-07 NOTE — Plan of Care (Signed)
  Problem: Education: Goal: Knowledge of disease or condition will improve Outcome: Progressing   Problem: Activity: Goal: Ability to tolerate increased activity will improve Outcome: Progressing Goal: Will verbalize the importance of balancing activity with adequate rest periods Outcome: Progressing   Problem: Respiratory: Goal: Ability to maintain a clear airway will improve Outcome: Progressing Goal: Levels of oxygenation will improve Outcome: Progressing Goal: Ability to maintain adequate ventilation will improve Outcome: Progressing   

## 2022-05-08 LAB — COMPREHENSIVE METABOLIC PANEL
ALT: 31 U/L (ref 0–44)
AST: 14 U/L — ABNORMAL LOW (ref 15–41)
Albumin: 3.2 g/dL — ABNORMAL LOW (ref 3.5–5.0)
Alkaline Phosphatase: 60 U/L (ref 38–126)
Anion gap: 9 (ref 5–15)
BUN: 14 mg/dL (ref 8–23)
CO2: 31 mmol/L (ref 22–32)
Calcium: 8.9 mg/dL (ref 8.9–10.3)
Chloride: 98 mmol/L (ref 98–111)
Creatinine, Ser: 0.68 mg/dL (ref 0.44–1.00)
GFR, Estimated: >60 mL/min (ref >60)
Glucose, Bld: 101 mg/dL — ABNORMAL HIGH (ref 70–99)
Potassium: 3.6 mmol/L (ref 3.5–5.1)
Sodium: 138 mmol/L (ref 135–145)
Total Bilirubin: 1.2 mg/dL (ref 0.3–1.2)
Total Protein: 5.4 g/dL — ABNORMAL LOW (ref 6.5–8.1)

## 2022-05-08 LAB — CBC
HCT: 36.6 % (ref 36.0–46.0)
Hemoglobin: 11.9 g/dL — ABNORMAL LOW (ref 12.0–15.0)
MCH: 31.5 pg (ref 26.0–34.0)
MCHC: 32.5 g/dL (ref 30.0–36.0)
MCV: 96.8 fL (ref 80.0–100.0)
Platelets: 229 10*3/uL (ref 150–400)
RBC: 3.78 MIL/uL — ABNORMAL LOW (ref 3.87–5.11)
RDW: 13.6 % (ref 11.5–15.5)
WBC: 10.2 10*3/uL (ref 4.0–10.5)
nRBC: 0 % (ref 0.0–0.2)

## 2022-05-08 MED ORDER — ORAL CARE MOUTH RINSE: 15.0000 mL | OROMUCOSAL | Status: DC | PRN

## 2022-05-08 NOTE — TOC Progression Note (Signed)
Transition of Care [facility]) - Progression Note    Patient Details  Name: Abigail Castillo MRN: [id number] Date of Birth: 1951-04-15  Transition of Care [facility]) CM/SW [place], [place] Phone Number: 05/08/2022, 4:51 PM  Clinical Narrative:     CSW spoke with [facility]/ Freda Munro - she advised they were unable to continue to hold the patient's bed and they returned the patient's bi-pap.   SNF states they will re-order patient's bi-pap ( she must only be on the bi-pap at night) Requested documentation  bi pap only at night and setting be included in d/c summary once patient has insurance auth and is stable for discharge.   Insurance auth restarted - reference # [id number]- auth pending   TOC will continue to follow and assist with discharge planning.  Thurmond Butts, MSW, LCSW Clinical Social Worker     Expected Discharge Plan: Skilled Nursing Facility Barriers to Discharge: No Barriers Identified  Expected Discharge Plan and Services Expected Discharge Plan: [place]   Discharge Planning Services: CM Consult Post Acute Care Choice: Durable Medical Equipment, Home Health Living arrangements for the past 2 months: Mobile Home Expected Discharge Date: 05/06/22               DME Arranged: Nebulizer machine, Oxygen, Bipap DME Agency: [employer] Date DME Agency Contacted: 04/15/22 Time DME Agency Contacted: 50 Representative spoke with at DME Agency: Erasmo Downer [place]: PT [place]: [place] Date [place]: 04/18/22 Time [place]: 76 Representative spoke with at [place]: [place] Determinants of Health ([place]) Interventions    Readmission Risk Interventions     No data to display

## 2022-05-08 NOTE — TOC Progression Note (Signed)
Transition of Care [facility]) - Progression Note    Patient Details  Name: RHEA KAELIN MRN: [id number] Date of Birth: [birth year]  Transition of Care [facility]) CM/SW [place], [place] Phone Number: 05/08/2022, 2:55 PM  Clinical Narrative:     Contacted [facility]/Sheila- SNF expressed concerns regarding patient's need for bi-pap. She advised they can only admit the patient if bi-pap is needed at night only.   MD has been updated Insurance authorization 12/11-12/13 but Josem Kaufmann remains good until midnight.   Expected Discharge Plan: [place] Barriers to Discharge: No Barriers Identified  Expected Discharge Plan and Services Expected Discharge Plan: [place]   Discharge Planning Services: CM Consult Post Acute Care Choice: Durable Medical Equipment, Home Health Living arrangements for the past 2 months: Mobile Home Expected Discharge Date: 05/06/22               DME Arranged: Nebulizer machine, Oxygen, Bipap DME Agency: [employer] Date DME Agency Contacted: 04/15/22 Time DME Agency Contacted: 1430 Representative spoke with at DME Agency: Erasmo Downer HH Arranged: PT [place]: [place] Date [place]: 04/18/22 Time [place]: 5 Representative spoke with at [place]: [place] Determinants of Health ([place]) Interventions    Readmission Risk Interventions     No data to display

## 2022-05-08 NOTE — Progress Notes (Signed)
Palliative Medicine Progress Note   Patient Name: Abigail Castillo       Date: 05/08/2022 DOB: 04-12-1951  Age: 71 y.o. MRN#: [id number] Attending Physician: Modena Jansky, MD Primary Care Physician: [name], NP Admit Date: 04/09/2022   HPI/Patient Profile: 71 y.o. female  with past medical history of COPD, chronic respiratory failure with hypoxemia, depression, anxiety, GERD, and breast cancer on anastrozole who presented to [facility] ED on 04/09/2022 with shortness of breath. She was admitted with COPD exacerbation and acute on chronic respiratory failure. Hospital course as been prolonged by ongoing hypoxia with difficulty weaning O2 requirements.CT chest 11/20 was negative for PE but showed tracheobrochomalacia. Palliative Medicine was consulted for goals of care in the setting of risk for decompensation and readmission.    Subjective: Chart reviewed. On 12/12, patient became acutely short of breath and desaturated with oxygen requirement increasing to 8L/min HFNC. Potential discharge was placed on hold.   Bedside visit. [name] is OOB to recliner. Currently on oxygen at 3L/min. She has no acute complaints.  Continued education offered regarding the seriousness of her current medical situation, emphasizing her ongoing tenuous respiratory status. Continued conversation had regarding advanced care planning around issues including as code status. Toniette does verbalize understanding that her lung disease is advanced stage and is not going to improve. However, she remains unable (and/or unwilling) to verbalize any limitations to scope of treatment. I again encouraged her to have conversations with her daughter regarding her wishes.    Objective:  Physical Exam Vitals reviewed.   Constitutional:      General: She is not in acute distress.    Comments: Frail, chronically ill-appearing  Pulmonary:     Effort: Pulmonary effort is normal.  Neurological:     Mental Status: She is alert and oriented to person, place, and time.  Psychiatric:        Mood and Affect: Affect is flat.             Vital Signs: BP 137/80 (BP Location: Right Arm)   Pulse 100   Temp 97.7 F (36.5 C) (Oral)   Resp 20   Ht '5\' 2"'$  (1.575 m)   Wt 67.8 kg   SpO2 94%   BMI 27.34 kg/m  SpO2: SpO2: 94 % O2 Device: O2 Device: High Flow Nasal Cannula O2  Flow Rate: O2 Flow Rate (L/min): 3 L/min    Palliative Medicine Assessment & Plan   Assessment: Principal Problem:   COPD (chronic obstructive pulmonary disease) (HCC) Active Problems:   Depression   GERD (gastroesophageal reflux disease)   Acute on chronic respiratory failure with hypoxia (HCC)   Ductal carcinoma in situ (DCIS) of left breast   Hyponatremia   Bronchomalacia   Localized edema   COPD exacerbation (HCC)   Tracheobronchomalacia   Acute on chronic respiratory failure with hypoxia and hypercapnia (HCC)    Recommendations/Plan: Full code/full scope care  Goal of care is for rehab to improve functional status and ultimately return home Spiritual care completed HCPOA forms on 12/6 Outpatient palliative - referral has been made to [facility]   This NP is off service starting 12/15, please call [phone] for urgent needs  Discharge Planning: [place] for rehab with Palliative care service follow-up    Thank you for allowing the Palliative Medicine Team to assist in the care of this patient.   MDM - [place]   Lavena Bullion, NP   Please contact Palliative Medicine Team phone at [phone] for questions and concerns.  For individual providers, please see AMION.

## 2022-05-08 NOTE — Progress Notes (Signed)
PROGRESS NOTE   Abigail Castillo  MWU:[id number]    DOB: 06-19-1950    DOA: 04/09/2022  PCP: [name], NP   I have briefly reviewed patients previous medical records in [facility].  Chief Complaint  Patient presents with   Cough   Shortness of Breath    Brief Narrative:   71 year old woman PMH COPD, chronic hypoxic respiratory failure on 2-3 L, moderate pulmonary hypertension presented with shortness of breath.  Seen by pulmonary, treated with bronchodilators, steroids for acute COPD exacerbation complicated by tracheobronchomalacia.  Plan for BiPAP at home.  Planning for SNF once respiratory status improved. 11/15 admit for COPD exacerbation 11/16 pulmonology consult 12/1 per PCCM advised to discharge on BIPAP QHS and PRN for shortness of breath 10/5, Bleed in 3-4 L of oxygen/min, f/u at pulm office. Discharge planned, but decompensated, PCCM re-involved. 12/2 COPD with acute exacerbation, seems recurrent after reducing steroid dose.  12/2-12/12 slow improvement but became acutely short of breath, desaturated, oxygen requirement went up to 8 L/min HFNC on 12/12 holding potential discharge. Chronic tenuous respiratory status which may be close to her baseline.  Medically optimized for DC but SNF is to reorder her BiPAP, continue PT and OT and insurance reauthorization needed prior to DC.  Delays.   Assessment & Plan:  Principal Problem:   COPD (chronic obstructive pulmonary disease) (HCC) Active Problems:   Depression   GERD (gastroesophageal reflux disease)   Acute on chronic respiratory failure with hypoxia (HCC)   Ductal carcinoma in situ (DCIS) of left breast   Hyponatremia   Bronchomalacia   Localized edema   COPD exacerbation (HCC)   Tracheobronchomalacia   Acute on chronic respiratory failure with hypoxia and hypercapnia (HCC)   Acute exacerbation of severe COPD Acute on chronic hypoxic respiratory failure (2-3L as outpatient): CT without complicating  features.  Treated with antibiotics.   No pulmonology follow-up noted and appointments on epic.  Reached out to Dr. Erskine Emery, PCCM who will assist with follow-up appointment with Dr. Lamonte Sakai, primary pulmonologist. Given her advanced COPD, it is anticipated that she will be steroid-dependent.  As communicated with PCCM on 12/14, taper prednisone by 10 mg weekly to a maintenance dose of 10 mg daily.  PCCM can manage this further during outpatient follow-up.  She was not on steroids PTA. Continue azithromycin 3 days/week-plan on this at discharge Continue Singulair and Claritin.  If her sinus disease is not well-controlled, can add Flonase or azelastine. Continue to yupelri & brovana.  Continue DuoNebs every 4 hours on top of the long-acting meds.  Patient has not been using BiPAP in the daytime.  Confirmed with PCCM on 12/14 that patient can DC only on scheduled BiPAP nightly. On 12/12, she got acutely dyspneic, desaturated with oxygen requirement up to 8 L/min HFNC.  Has been weaned down to 3 L/min [place] oxygen.   Palliative care input 12/10 appreciated: Full code, full scope of care but need to have ongoing discussions regarding goals of care given her extremely poor prognosis.  Outpatient palliative care follow-up for ongoing conversations regarding [place].   Tracheobronchomalacia Noted on CT during this admission seen by pulmonary, plan is to continue on BiPAP at bedtime, oxygen support and outpatient pulmonary follow-up.  SNF has returned patient's BiPAP and were not holding her room and will have to do this again  Hypertension: Controlled on amlodipine.   Anxiety/depression  Stable. Continue olanzapine and Ativan.   History of breast cancer status post-lumpectomy Continue anastrozole  [place] Will request OP Palliative support on discharge --> given the need for ongoing conversations related to goals of care  Body mass index is 27.34 kg/m.   DVT prophylaxis: enoxaparin (LOVENOX) injection 40  mg Start: 04/09/22 1215     Code Status: Full Code:  Family Communication: None at bedside Disposition:  Patient is medically optimized for DC pending reevaluation by PT, OT, insurance authorization for SNF and SNF acceptance.     Consultants:   Palliative care medicine PCCM  Procedures:     Antimicrobials:      Subjective:  Denies complaints.  Breathing improved and feels that she may be close to her baseline.  Objective:   Vitals:   05/08/22 0605 05/08/22 0804 05/08/22 0845 05/08/22 1235  BP:   137/80 121/78  Pulse: 83 92 100 (!) 119  Resp:  '18 20 20  '$ Temp:   97.7 F (36.5 C) 97.8 F (36.6 C)  TempSrc:   Oral Oral  SpO2: 97% 97% 94% 93%  Weight:      Height:        General exam: Elderly female, moderately built and nourished, lying comfortably propped up in bed without distress. Respiratory system: Distant breath sounds with wheezing, rhonchi or crackles.  No increased work of breathing. Cardiovascular system: S1 & S2 heard, RRR. No JVD, murmurs, rubs, gallops or clicks. No pedal edema.  Not on telemetry. Gastrointestinal system: Abdomen is nondistended, soft and nontender. No organomegaly or masses felt. Normal bowel sounds heard. Central nervous system: Alert and oriented. No focal neurological deficits. Extremities: Symmetric 5 x 5 power. Skin: No rashes, lesions or ulcers Psychiatry: Judgement and insight appear normal. Mood & affect appropriate.     Data Reviewed:   I have personally reviewed following labs and imaging studies   CBC: Recent Labs  Lab 05/08/22 0123  WBC 10.2  HGB 11.9*  HCT 36.6  MCV 96.8  PLT 956    Basic Metabolic Panel: Recent Labs  Lab 05/08/22 0123  NA 138  K 3.6  CL 98  CO2 31  GLUCOSE 101*  BUN 14  CREATININE 0.68  CALCIUM 8.9    Liver Function Tests: Recent Labs  Lab 05/08/22 0123  AST 14*  ALT 31  ALKPHOS 60  BILITOT 1.2  PROT 5.4*  ALBUMIN 3.2*    CBG: No results for input(s): "GLUCAP" in the  last 168 hours.  Microbiology Studies:  No results found for this or any previous visit (from the past 240 hour(s)).  Radiology Studies:  No results found.  Scheduled Meds:    amLODipine  2.5 mg Oral Daily   anastrozole  1 mg Oral Daily   arformoterol  15 mcg Nebulization BID   azithromycin  500 mg Oral Once per day on Mon Wed Fri   enoxaparin (LOVENOX) injection  40 mg Subcutaneous Q24H   furosemide  20 mg Oral Daily   guaiFENesin  1,200 mg Oral BID   lidocaine  1 patch Transdermal Daily   loratadine  10 mg Oral Daily   montelukast  10 mg Oral QHS   OLANZapine  7.5 mg Oral Daily   pantoprazole  40 mg Oral BID   polyethylene glycol  17 g Oral BID   predniSONE  40 mg Oral Q breakfast   revefenacin  175 mcg Nebulization Daily   senna-docusate  1 tablet Oral BID    Continuous Infusions:     LOS: 29 days     Vernell Leep, MD,  FACP, FHM,  SFHM, [place], [place]     To contact the attending provider between 7A-7P or the covering provider during after hours 7P-7A, please log into the web site [email or web address] and access using universal [place] password for that web site. If you do not have the password, please call the hospital operator.  05/08/2022, 3:43 PM

## 2022-05-08 NOTE — Care Management Important Message (Signed)
Important Message  Patient Details  Name: Abigail Castillo MRN: [id number] Date of Birth: 05-17-1951   Medicare Important Message Given:  Yes     Shelda Altes 05/08/2022, 10:29 AM

## 2022-05-08 NOTE — Progress Notes (Signed)
Physical Therapy Treatment Patient Details Name: Abigail Castillo MRN: [id number] DOB: Nov 22, 1950 Today's Date: 05/08/2022   History of Present Illness Pt is a 71 y.o. F who presents 04/09/2022 with COPD with acute exacerbation. Significant PMH: COPD, chronic respiratory failure with hypoxemia, COVID-19, osteoporosis, tobacco abuse.    PT Comments    Pt requires supervision transfers. Performed LE exercises in recliner with feet elevated. Pt on 3L continuous O2 with SpO2 90-93%. Pt remained in recliner at end of session.    Recommendations for follow up therapy are one component of a multi-disciplinary discharge planning process, led by the attending physician.  Recommendations may be updated based on patient status, additional functional criteria and insurance authorization.  Follow Up Recommendations  Skilled nursing-short term rehab (<3 hours/day) Can patient physically be transported by private vehicle: Yes   Assistance Recommended at Discharge Set up Supervision/Assistance  Patient can return home with the following Assistance with cooking/housework;Assist for transportation;Help with stairs or ramp for entrance;A little help with walking and/or transfers;A little help with bathing/dressing/bathroom   Equipment Recommendations  None recommended by PT    Recommendations for Other Services       Precautions / Restrictions Precautions Precautions: Fall;Other (comment) Precaution Comments: watch O2     Mobility  Bed Mobility Overal bed mobility: Modified Independent                  Transfers Overall transfer level: Needs assistance Equipment used: None Transfers: Bed to chair/wheelchair/BSC   Stand pivot transfers: Supervision         General transfer comment: Supervision for safety    Ambulation/Gait                   Stairs             Wheelchair Mobility    Modified Rankin (Stroke Patients Only)       Balance Overall balance  assessment: Needs assistance Sitting-balance support: Feet supported, No upper extremity supported Sitting balance-Leahy Scale: Good     Standing balance support: No upper extremity supported, During functional activity Standing balance-Leahy Scale: Fair                              Cognition Arousal/Alertness: Awake/alert Behavior During Therapy: Flat affect Overall Cognitive Status: No family/caregiver present to determine baseline cognitive functioning                                 General Comments: Slow processing/response time        Exercises General Exercises - Lower Extremity Ankle Circles/Pumps: AROM, Both, 20 reps Gluteal Sets: AROM, Both, 10 reps Heel Slides: AROM, Right, Left, 10 reps Hip ABduction/ADduction: AROM, Both, 10 reps Straight Leg Raises: AAROM, Right, Left, 10 reps    General Comments General comments (skin integrity, edema, etc.): SpO2 90-93% on 3L      Pertinent Vitals/Pain Pain Assessment Pain Assessment: Faces Faces Pain Scale: No hurt    Home Living                          Prior Function            PT Goals (current goals can now be found in the care plan section) Acute Rehab PT Goals Patient Stated Goal: improve breathing Progress towards PT goals: Progressing toward goals  Frequency    Min 2X/week      PT Plan Current plan remains appropriate    Co-evaluation              AM-PAC PT "6 Clicks" Mobility   Outcome Measure  Help needed turning from your back to your side while in a flat bed without using bedrails?: None Help needed moving from lying on your back to sitting on the side of a flat bed without using bedrails?: None Help needed moving to and from a bed to a chair (including a wheelchair)?: A Little Help needed standing up from a chair using your arms (e.g., wheelchair or bedside chair)?: A Little Help needed to walk in hospital room?: A Little Help needed climbing  3-5 steps with a railing? : A Lot 6 Click Score: 19    End of Session Equipment Utilized During Treatment: Oxygen Activity Tolerance: Patient tolerated treatment well Patient left: in chair;with call bell/phone within reach;with chair alarm set Nurse Communication: Mobility status PT Visit Diagnosis: Difficulty in walking, not elsewhere classified (R26.2);Unsteadiness on feet (R26.81);Other abnormalities of gait and mobility (R26.89)     Time: 5300-5110 PT Time Calculation (min) (ACUTE ONLY): 17 min  Charges:  $Therapeutic Exercise: 8-22 mins                     Lorrin Goodell, PT  Office # [phone] Pager [phone]    Lorriane Shire 05/08/2022, 11:27 AM

## 2022-05-08 NOTE — Progress Notes (Signed)
Mobility Specialist Progress Note:   05/08/22 0900  Mobility  Activity Ambulated with assistance in room  Level of Assistance Modified independent, requires aide device or extra time  Assistive Device Front wheel walker  Distance Ambulated (ft) 20 ft  Activity Response Tolerated well  $Mobility charge 1 Mobility   Pt in bed needing to participate in mobility. Ambulated to [facility] and able to have BM. No complaints of pain. Left in chair with call bell in reach and all needs met.   Abigail Castillo Mobility Specialist Please contact via [employer] or  Rehab Office at [phone]

## 2022-05-09 ENCOUNTER — Inpatient Hospital Stay: Payer: Medicare Other | Admitting: Adult Health

## 2022-05-09 NOTE — Progress Notes (Signed)
Mobility Specialist Progress Note:   05/09/22 1338  Mobility  Activity Ambulated with assistance in hallway  Level of Assistance Modified independent, requires aide device or extra time  Assistive Device Front wheel walker  Distance Ambulated (ft) 40 ft  Activity Response Tolerated well  $Mobility charge 1 Mobility   Pt in chair willing to participate in mobility. No complaints of pain. Left in chair with call bell in reach and all needs met.   Gareth [place] Darryll Raju Mobility Specialist Please contact via [employer] or  Rehab Office at [phone]

## 2022-05-09 NOTE — TOC Progression Note (Signed)
Transition of Care [facility]) - Progression Note    Patient Details  Name: Abigail Castillo MRN: [id number] Date of Birth: 10-Oct-1950  Transition of Care [facility]) CM/SW [place], [place] Phone Number: 05/09/2022, 4:40 PM  Clinical Narrative:     Informed MD  of peer to peer to requested # [id number] option 5- must be completed by Monday 12/18 @ 11:30 am   Thurmond Butts, MSW, LCSW Clinical Social Worker    Expected Discharge Plan: [place] Barriers to Discharge: No Barriers Identified  Expected Discharge Plan and Services Expected Discharge Plan: [place]   Discharge Planning Services: CM Consult Post Acute Care Choice: Durable Medical Equipment, Home Health Living arrangements for the past 2 months: Mobile Home Expected Discharge Date: 05/06/22               DME Arranged: Nebulizer machine, Oxygen, Bipap DME Agency: [employer] Date DME Agency Contacted: 04/15/22 Time DME Agency Contacted: 1430 Representative spoke with at DME Agency: Erasmo Downer HH Arranged: PT [place]: [place] Date [place]: 04/18/22 Time [place]: 23 Representative spoke with at [place]: [place] Determinants of Health ([place]) Interventions    Readmission Risk Interventions     No data to display

## 2022-05-09 NOTE — TOC Progression Note (Signed)
Transition of Care [facility]) - Progression Note    Patient Details  Name: Abigail Castillo MRN: [id number] Date of Birth: Dec 12, 1950  Transition of Care [facility]) CM/SW [place], [place] Phone Number: 05/09/2022, 10:09 AM  Clinical Narrative:     Insurance is pending  Expected Discharge Plan: [place] Barriers to Discharge: No Barriers Identified  Expected Discharge Plan and Services Expected Discharge Plan: [place]   Discharge Planning Services: CM Consult Post Acute Care Choice: Durable Medical Equipment, Home Health Living arrangements for the past 2 months: Mobile Home Expected Discharge Date: 05/06/22               DME Arranged: Nebulizer machine, Oxygen, Bipap DME Agency: [employer] Date DME Agency Contacted: 04/15/22 Time DME Agency Contacted: 1430 Representative spoke with at DME Agency: Erasmo Downer HH Arranged: PT [place]: [place] Date [place]: 04/18/22 Time [place]: 65 Representative spoke with at [place]: [place] Determinants of Health ([place]) Interventions    Readmission Risk Interventions     No data to display

## 2022-05-09 NOTE — Evaluation (Signed)
Occupational Therapy Evaluation Patient Details Name: Abigail Castillo MRN: [id number] DOB: 04-21-1951 Today's Date: 05/09/2022   History of Present Illness Pt is a 71 y.o. F who presents 04/09/2022 with COPD with acute exacerbation. Significant PMH: COPD, chronic respiratory failure with hypoxemia, COVID-19, osteoporosis, tobacco abuse.   Clinical Impression   Pt lives with her son and typically functions independently in self care and light meal prep and uses 2-3L 02 chronically. Her family assists with IADLs. She uses no AD up to a RW depending on how she is feeling. Pt presents with generalized weakness, decreased activity tolerance and impaired standing balance. No family available to determine baseline cognition. Sp02 down to 88% on 3L with activity, but rebounded to 93% with pursed lip breathing.  She requires set up to moderate assistance for ADLs. Recommending ST rehab in SNF prior to return home.      Recommendations for follow up therapy are one component of a multi-disciplinary discharge planning process, led by the attending physician.  Recommendations may be updated based on patient status, additional functional criteria and insurance authorization.   Follow Up Recommendations  Skilled nursing-short term rehab (<3 hours/day)     Assistance Recommended at Discharge Frequent or constant Supervision/Assistance  Patient can return home with the following A little help with walking and/or transfers;A lot of help with bathing/dressing/bathroom;Direct supervision/assist for financial management;Assist for transportation;Help with stairs or ramp for entrance;Assistance with cooking/housework    Functional Status Assessment  Patient has had a recent decline in their functional status and demonstrates the ability to make significant improvements in function in a reasonable and predictable amount of time.  Equipment Recommendations  Other (comment) (defer to next venue)     Recommendations for Other Services       Precautions / Restrictions Precautions Precautions: Fall;Other (comment) Precaution Comments: watch O2 Restrictions Weight Bearing Restrictions: No      Mobility Bed Mobility               General bed mobility comments: in recliner on arrival    Transfers Overall transfer level: Needs assistance Equipment used: Rolling walker (2 wheels) Transfers: Sit to/from Stand Sit to Stand: Supervision                  Balance     Sitting balance-Leahy Scale: Good     Standing balance support: No upper extremity supported, During functional activity Standing balance-Leahy Scale: Fair Standing balance comment: at sink                           ADL either performed or assessed with clinical judgement   ADL Overall ADL's : Needs assistance/impaired Eating/Feeding: Independent;Sitting   Grooming: Supervision/safety;Standing   Upper Body Bathing: Minimal assistance;Sitting   Lower Body Bathing: Moderate assistance;Sit to/from stand   Upper Body Dressing : Set up;Sitting   Lower Body Dressing: Moderate assistance;Sit to/from stand   Toilet Transfer: Supervision/safety;Ambulation;Rolling walker (2 wheels)   Toileting- Clothing Manipulation and Hygiene: Modified independent;Sitting/lateral lean       Functional mobility during ADLs: Supervision/safety;Rolling walker (2 wheels)       Vision Patient Visual Report: No change from baseline       Perception     Praxis      Pertinent Vitals/Pain Pain Assessment Pain Assessment: No/denies pain     Hand Dominance Right   Extremity/Trunk Assessment Upper Extremity Assessment Upper Extremity Assessment: Overall WFL for tasks assessed   Lower Extremity  Assessment Lower Extremity Assessment: Defer to PT evaluation       Communication Communication Communication: No difficulties   Cognition Arousal/Alertness: Awake/alert Behavior During Therapy:  Flat affect Overall Cognitive Status: No family/caregiver present to determine baseline cognitive functioning                                 General Comments: Slow processing/response time     General Comments       Exercises     Shoulder Instructions      Home Living Family/patient expects to be discharged to:: Private residence Living Arrangements: Children Available Help at Discharge: Family;Available PRN/intermittently Type of Home: Mobile home Home Access: Stairs to enter Entrance Stairs-Number of Steps: 6   Home Layout: One level     Bathroom Shower/Tub: Teacher, early years/pre: Standard     Home Equipment: Rollator (4 wheels);Cane - single point;Wheelchair - manual          Prior Functioning/Environment Prior Level of Function : Needs assist             Mobility Comments: uses cane vs Rollator vs no AD depending on day, does not drive ADLs Comments: pt sister does grocery shopping, pt performs IADL's        OT Problem List: Decreased activity tolerance;Decreased strength;Impaired balance (sitting and/or standing)      OT Treatment/Interventions: Self-care/ADL training;DME and/or AE instruction;Energy conservation;Therapeutic activities;Cognitive remediation/compensation;Balance training;Patient/family education    OT Goals(Current goals can be found in the care plan section) Acute Rehab OT Goals OT Goal Formulation: With patient Time For Goal Achievement: 05/23/22 Potential to Achieve Goals: Good ADL Goals Pt Will Perform Grooming: with modified independence;standing Pt Will Perform Lower Body Bathing: with modified independence;sit to/from stand Pt Will Perform Lower Body Dressing: with modified independence;sit to/from stand Pt Will Transfer to Toilet: with modified independence;ambulating;bedside commode Pt Will Perform Toileting - Clothing Manipulation and hygiene: with modified independence;sit to/from  stand Additional ADL Goal #1: Pt will generalize energy conservation strategies in ADLs and mobility.  OT Frequency: Min 2X/week    Co-evaluation              AM-PAC OT "6 Clicks" Daily Activity     Outcome Measure Help from another person eating meals?: None Help from another person taking care of personal grooming?: A Little Help from another person toileting, which includes using toliet, bedpan, or urinal?: A Little Help from another person bathing (including washing, rinsing, drying)?: A Lot Help from another person to put on and taking off regular upper body clothing?: A Little Help from another person to put on and taking off regular lower body clothing?: A Lot 6 Click Score: 17   End of Session Equipment Utilized During Treatment: Rolling walker (2 wheels);Oxygen  Activity Tolerance: Patient tolerated treatment well Patient left: in chair;with call bell/phone within reach  OT Visit Diagnosis: Unsteadiness on feet (R26.81);Other abnormalities of gait and mobility (R26.89);Muscle weakness (generalized) (M62.81)                Time: 1030-1046 OT Time Calculation (min): 16 min Charges:  OT General Charges $OT Visit: 1 Visit OT Evaluation $OT Eval Moderate Complexity: [place], OTR/L Acute Rehabilitation Services Office: [phone]   [name] 05/09/2022, 11:59 AM

## 2022-05-09 NOTE — TOC Progression Note (Signed)
Transition of Care [facility]) - Progression Note    Patient Details  Name: SHARDEA CWYNAR MRN: [id number] Date of Birth: July 22, 1950  Transition of Care [facility]) CM/SW [place], [place] Phone Number: 05/09/2022, 6:13 PM  Clinical Narrative:     Received insurance authorization [id number] 12/15-12/19. [facility] unable to admit over the weekend. Informed anticipate d/c on Monday if remains stable - will need bipap.  TOC will continue to follow and assist with discharge planning.  Thurmond Butts, MSW, LCSW Clinical Social Worker    Expected Discharge Plan: Skilled Nursing Facility Barriers to Discharge: No Barriers Identified  Expected Discharge Plan and Services Expected Discharge Plan: [place]   Discharge Planning Services: CM Consult Post Acute Care Choice: Durable Medical Equipment, Home Health Living arrangements for the past 2 months: Mobile Home Expected Discharge Date: 05/06/22               DME Arranged: Nebulizer machine, Oxygen, Bipap DME Agency: [employer] Date DME Agency Contacted: 04/15/22 Time DME Agency Contacted: 35 Representative spoke with at DME Agency: Erasmo Downer [place]: PT [place]: [place] Date [employer]: 04/18/22 Time [place]: 12 Representative spoke with at [place]: [place] Determinants of Health ([place]) Interventions    Readmission Risk Interventions     No data to display

## 2022-05-09 NOTE — Progress Notes (Signed)
Physical Therapy Treatment Patient Details Name: Abigail Castillo MRN: [id number] DOB: Nov 07, 1950 Today's Date: 05/09/2022   History of Present Illness Pt is a 71 y.o. F who presents 04/09/2022 with COPD with acute exacerbation. Significant PMH: COPD, chronic respiratory failure with hypoxemia, COVID-19, osteoporosis, tobacco abuse.    PT Comments    Patient in recliner on arrival and agreeable to brief PT tx session. Ambulated 79' with RW and supervision on 3L O2 with spO2 dropped to 86%. Cues for pursed lip breathing with increase to 92%. Performed 2 sets of sit to stand x 5 from recliner. RN present at end of session to administer medications. Patient declines further exercises or mobility. Continue to recommend SNF for ongoing Physical Therapy.      Recommendations for follow up therapy are one component of a multi-disciplinary discharge planning process, led by the attending physician.  Recommendations may be updated based on patient status, additional functional criteria and insurance authorization.  Follow Up Recommendations  Skilled nursing-short term rehab (<3 hours/day) Can patient physically be transported by private vehicle: Yes   Assistance Recommended at Discharge Set up Supervision/Assistance  Patient can return home with the following Assistance with cooking/housework;Assist for transportation;Help with stairs or ramp for entrance;A little help with walking and/or transfers;A little help with bathing/dressing/bathroom   Equipment Recommendations  None recommended by PT    Recommendations for Other Services       Precautions / Restrictions Precautions Precautions: Fall;Other (comment) Precaution Comments: watch O2 Restrictions Weight Bearing Restrictions: No     Mobility  Bed Mobility               General bed mobility comments: in recliner on arrival    Transfers Overall transfer level: Needs assistance Equipment used: None Transfers: Sit to/from  Stand Sit to Stand: Supervision           General transfer comment: supervision for safety and line management. Completed 2 sets of sit to stand x 5    Ambulation/Gait Ambulation/Gait assistance: Supervision Gait Distance (Feet): 40 Feet Assistive device: Rolling [place] (2 wheels) Gait Pattern/deviations: Step-through pattern, Decreased stride length Gait velocity: decreased     General Gait Details: supervision for safety. Ambulated on 3L O2 with spO2 dropped to 86% at end of mobility   Stairs             Wheelchair Mobility    Modified Rankin (Stroke Patients Only)       Balance Overall balance assessment: Mild deficits observed, not formally tested                                          Cognition Arousal/Alertness: Awake/alert Behavior During Therapy: Flat affect Overall Cognitive Status: No family/caregiver present to determine baseline cognitive functioning                                 General Comments: Slow processing/response time        Exercises Other Exercises Other Exercises: 2 sets of sit to stand x 5    General Comments        Pertinent Vitals/Pain Pain Assessment Pain Assessment: Faces Faces Pain Scale: No hurt Pain Intervention(s): Monitored during session    Home Living  Prior Function            PT Goals (current goals can now be found in the care plan section) Acute Rehab PT Goals PT Goal Formulation: With patient Time For Goal Achievement: 05/13/22 Potential to Achieve Goals: Good Progress towards PT goals: Progressing toward goals    Frequency    Min 2X/week      PT Plan Current plan remains appropriate    Co-evaluation              AM-PAC PT "6 Clicks" Mobility   Outcome Measure  Help needed turning from your back to your side while in a flat bed without using bedrails?: None Help needed moving from lying on your back to  sitting on the side of a flat bed without using bedrails?: None Help needed moving to and from a bed to a chair (including a wheelchair)?: A Little Help needed standing up from a chair using your arms (e.g., wheelchair or bedside chair)?: A Little Help needed to walk in hospital room?: A Little Help needed climbing 3-5 steps with a railing? : A Lot 6 Click Score: 19    End of Session Equipment Utilized During Treatment: Oxygen Activity Tolerance: Patient tolerated treatment well Patient left: in chair;with call bell/phone within reach Nurse Communication: Mobility status PT Visit Diagnosis: Difficulty in walking, not elsewhere classified (R26.2);Unsteadiness on feet (R26.81);Other abnormalities of gait and mobility (R26.89)     Time: 4315-4008 PT Time Calculation (min) (ACUTE ONLY): 17 min  Charges:  $Therapeutic Activity: 8-22 mins                     Laronica Bhagat A. Gilford Rile, PT, DPT [facility]  Linna Hoff 05/09/2022, 9:16 AM

## 2022-05-09 NOTE — Progress Notes (Addendum)
PROGRESS NOTE   Abigail Castillo  QVZ:[id number]    DOB: [birth year]    DOA: 04/09/2022  PCP: [name], NP   I have briefly reviewed patients previous medical [facility] in [facility].  Chief Complaint  Patient presents with   Cough   Shortness of Breath    Brief Narrative:   71 year old woman PMH COPD, chronic hypoxic respiratory failure on 2-3 L, moderate pulmonary hypertension presented with shortness of breath.  Seen by pulmonary, treated with bronchodilators, steroids for acute COPD exacerbation complicated by tracheobronchomalacia.  Plan for BiPAP at home.  Planning for SNF once respiratory status improved. 11/15 admit for COPD exacerbation 11/16 pulmonology consult 12/1 per PCCM advised to discharge on BIPAP QHS and PRN for shortness of breath 10/5, Bleed in 3-4 L of oxygen/min, f/u at pulm office. Discharge planned, but decompensated, PCCM re-involved. 12/2 COPD with acute exacerbation, seems recurrent after reducing steroid dose.  12/2-12/12 slow improvement but became acutely short of breath, desaturated, oxygen requirement went up to 8 L/min HFNC on 12/12 holding potential discharge. Chronic tenuous respiratory status which may be close to her baseline.  Medically optimized for DC but SNF is to reorder her BiPAP, reevaluation by PT and OT and insurance reauthorization needed prior to DC.  Delays. Insurance had denied her SNF admission.  I did a peer to peer review with Dr. Ellison Carwin, insurance medical director on 12/15 and was able to overturn the denial.  She will approve SNF admission.   Assessment & Plan:  Principal Problem:   COPD (chronic obstructive pulmonary disease) (HCC) Active Problems:   Depression   GERD (gastroesophageal reflux disease)   Acute on chronic respiratory failure with hypoxia (HCC)   Ductal carcinoma in situ (DCIS) of left breast   Hyponatremia   Bronchomalacia   Localized edema   COPD exacerbation (HCC)    Tracheobronchomalacia   Acute on chronic respiratory failure with hypoxia and hypercapnia (HCC)   Acute exacerbation of severe COPD Acute on chronic hypoxic respiratory failure (2-3L as outpatient): CT without complicating features.  Treated with antibiotics.   No pulmonology follow-up noted and appointments on epic.  Reached out to Dr. Erskine Emery, PCCM who will assist with follow-up appointment with Dr. Lamonte Sakai, primary pulmonologist. Given her advanced COPD, it is anticipated that she will be steroid-dependent.  As communicated with PCCM on 12/14, taper prednisone by 10 mg weekly to a maintenance dose of 10 mg daily.  PCCM can manage this further during outpatient follow-up.  She was not on steroids PTA. Continue azithromycin 3 days/week-plan on this at discharge Continue Singulair and Claritin.  If her sinus disease is not well-controlled, can add Flonase or azelastine. Continue to yupelri & brovana.  Continue DuoNebs every 4 hours on top of the long-acting meds.  Patient has not been using BiPAP in the daytime.  Confirmed with PCCM on 12/14 that patient can DC only on scheduled BiPAP nightly.  Tolerated BiPAP overnight On 12/12, she got acutely dyspneic, desaturated with oxygen requirement up to 8 L/min HFNC.  Has been weaned down to 3 L/min [place] oxygen.  Baseline O2 use at home per patient is 2.5-3 L and is at baseline now. Palliative care input 12/10 appreciated: Full code, full scope of care but need to have ongoing discussions regarding goals of care given her extremely poor prognosis.  Outpatient palliative care follow-up for ongoing conversations regarding [place].   Tracheobronchomalacia Noted on CT during this admission seen by pulmonary, plan is to continue  on BiPAP at bedtime, oxygen support and outpatient pulmonary follow-up.  SNF has returned patient's BiPAP and were not holding her room and will have to do this again.  Tolerated BiPAP last night.  Hypertension: Controlled on amlodipine.    Anxiety/depression  Stable. Continue olanzapine and Ativan.   History of breast cancer status post-lumpectomy Continue anastrozole   GOC Will request OP Palliative support on discharge --> given the need for ongoing conversations related to goals of care  Body mass index is 27.34 kg/m.   DVT prophylaxis: enoxaparin (LOVENOX) injection 40 mg Start: 04/09/22 1215     Code Status: Full Code:  Family Communication: Daughter via phone. Disposition:  Patient is medically optimized for DC pending reevaluation by PT, OT, insurance authorization for SNF and SNF acceptance.  PT has reevaluated and recommend SNF.     Consultants:   Palliative care medicine PCCM  Procedures:     Antimicrobials:      Subjective:  Chronic dyspnea, especially on exertion, not new and at baseline per patient.  States that her breathing is back to her baseline.  Feels somewhat nervous today.  Tolerated BiPAP last night.  Objective:   Vitals:   05/09/22 0515 05/09/22 0757 05/09/22 0820 05/09/22 0821  BP: 138/80 134/83    Pulse: 90 (!) 114    Resp: 16 18    Temp: 97.6 F (36.4 C) (!) 97.3 F (36.3 C)    TempSrc: Oral Oral    SpO2: 95% 94% 97% 97%  Weight:      Height:        General exam: Elderly female, moderately built and nourished, sitting up comfortably in chair without distress.   Respiratory system: No change since yesterday.  Distant breath sounds but clear to auscultation without wheezing, rhonchi or crackles.  No increased work of breathing at rest. Cardiovascular system: S1 & S2 heard, RRR. No JVD, murmurs, rubs, gallops or clicks. No pedal edema.  Not on telemetry. Gastrointestinal system: Abdomen is nondistended, soft and nontender. No organomegaly or masses felt. Normal bowel sounds heard. Central nervous system: Alert and oriented. No focal neurological deficits. Extremities: Symmetric 5 x 5 power. Skin: No rashes, lesions or ulcers Psychiatry: Judgement and insight appear  normal. Mood & affect appropriate.     Data Reviewed:   I have personally reviewed following labs and imaging studies   CBC: Recent Labs  Lab 05/08/22 0123  WBC 10.2  HGB 11.9*  HCT 36.6  MCV 96.8  PLT 585    Basic Metabolic Panel: Recent Labs  Lab 05/08/22 0123  NA 138  K 3.6  CL 98  CO2 31  GLUCOSE 101*  BUN 14  CREATININE 0.68  CALCIUM 8.9    Liver Function Tests: Recent Labs  Lab 05/08/22 0123  AST 14*  ALT 31  ALKPHOS 60  BILITOT 1.2  PROT 5.4*  ALBUMIN 3.2*    CBG: No results for input(s): "GLUCAP" in the last 168 hours.  Microbiology Studies:  No results found for this or any previous visit (from the past 240 hour(s)).  Radiology Studies:  No results found.  Scheduled Meds:    amLODipine  2.5 mg Oral Daily   anastrozole  1 mg Oral Daily   arformoterol  15 mcg Nebulization BID   azithromycin  500 mg Oral Once per day on Mon Wed Fri   enoxaparin (LOVENOX) injection  40 mg Subcutaneous Q24H   furosemide  20 mg Oral Daily   guaiFENesin  1,200 mg Oral  BID   lidocaine  1 patch Transdermal Daily   loratadine  10 mg Oral Daily   montelukast  10 mg Oral QHS   OLANZapine  7.5 mg Oral Daily   pantoprazole  40 mg Oral BID   polyethylene glycol  17 g Oral BID   predniSONE  40 mg Oral Q breakfast   revefenacin  175 mcg Nebulization Daily   senna-docusate  1 tablet Oral BID    Continuous Infusions:     LOS: 30 days     Vernell Leep, MD,  FACP, FHM, SFHM, [facility], [place]     To contact the attending provider between 7A-7P or the covering provider during after hours 7P-7A, please log into the web site [email or web address] and access using universal [place] password for that web site. If you do not have the password, please call the hospital operator.  05/09/2022, 9:29 AM

## 2022-05-10 DIAGNOSIS — J441 Chronic obstructive pulmonary disease with (acute) exacerbation: Secondary | ICD-10-CM | POA: Diagnosis not present

## 2022-05-10 NOTE — Progress Notes (Signed)
   05/10/22 1152  Mobility  Activity Ambulated with assistance in room  Level of Assistance Modified independent, requires aide device or extra time  Assistive Device Front wheel walker  Distance Ambulated (ft) 20 ft  Activity Response Tolerated well  Mobility Referral Yes  $Mobility charge 1 Mobility   Mobility Specialist Progress Note  Pre-Mobility: 94% SpO2 During Mobility: 86-92% SpO2 Post-Mobility: 93% SpO2  Pt was in chair and agreeable. X1 break d/t SOB but recovered after pursed lip breathing. Returned to chair w/ all needs met and call bell in reach.  Lucious Groves Mobility Specialist  Please contact via SecureChat or Rehab office at [phone]

## 2022-05-10 NOTE — Progress Notes (Signed)
PROGRESS NOTE   Abigail Castillo  KDT:[id number]    DOB: 1950/06/30    DOA: 04/09/2022  PCP: [name], NP   I have briefly reviewed patients previous medical records in [facility].  Chief Complaint  Patient presents with   Cough   Shortness of Breath    Brief Narrative:   71 year old woman PMH COPD, chronic hypoxic respiratory failure on 2-3 L, moderate pulmonary hypertension presented with shortness of breath.  Seen by pulmonary, treated with bronchodilators, steroids for acute COPD exacerbation complicated by tracheobronchomalacia.  Plan for BiPAP at home.  Planning for SNF once respiratory status improved. 11/15 admit for COPD exacerbation 11/16 pulmonology consult 12/1 per PCCM advised to discharge on BIPAP QHS and PRN for shortness of breath 10/5, Bleed in 3-4 L of oxygen/min, f/u at pulm office. Discharge planned, but decompensated, PCCM re-involved. 12/2 COPD with acute exacerbation, seems recurrent after reducing steroid dose.  12/2-12/12 slow improvement but became acutely short of breath, desaturated, oxygen requirement went up to 8 L/min HFNC on 12/12 holding potential discharge. Chronic tenuous respiratory status which may be close to her baseline.  Medically optimized for DC but SNF is to reorder her BiPAP, reevaluation by PT and OT and insurance reauthorization needed prior to DC.  Delays. Insurance had denied her SNF admission.  I did a peer to peer review with Dr. Ellison Carwin, insurance medical director on 12/15 and was able to overturn the denial.  Now approved for SNF admission but [facility] SNF unable to take her over the weekend.   Assessment & Plan:  Principal Problem:   COPD (chronic obstructive pulmonary disease) (HCC) Active Problems:   Depression   GERD (gastroesophageal reflux disease)   Acute on chronic respiratory failure with hypoxia (HCC)   Ductal carcinoma in situ (DCIS) of left breast   Hyponatremia   Bronchomalacia   Localized  edema   COPD exacerbation (HCC)   Tracheobronchomalacia   Acute on chronic respiratory failure with hypoxia and hypercapnia (HCC)   Acute exacerbation of severe COPD Acute on chronic hypoxic respiratory failure (2-3L as outpatient): CT without complicating features.  Treated with antibiotics.   No pulmonology follow-up noted and appointments on epic.  Reached out to Dr. Erskine Emery, PCCM who will assist with follow-up appointment with Dr. Lamonte Sakai, primary pulmonologist. Given her advanced COPD, it is anticipated that she will be steroid-dependent.  As communicated with PCCM on 12/14, taper prednisone by 10 mg weekly to a maintenance dose of 10 mg daily.  PCCM can manage this further during outpatient follow-up.  She was not on steroids PTA. Continue azithromycin 3 days/week-plan on this at discharge Continue Singulair and Claritin.  If her sinus disease is not well-controlled, can add Flonase or azelastine. Continue to yupelri & brovana.  Continue DuoNebs every 4 hours on top of the long-acting meds.  Patient has not been using BiPAP in the daytime.  Confirmed with PCCM on 12/14 that patient can DC only on scheduled BiPAP nightly.  Tolerated BiPAP overnight On 12/12, she got acutely dyspneic, desaturated with oxygen requirement up to 8 L/min HFNC.  Has been weaned down to 3 L/min [place] oxygen.  Baseline O2 use at home per patient is 2.5-3 L and is at baseline now. Palliative care input 12/10 appreciated: Full code, full scope of care but need to have ongoing discussions regarding goals of care given her extremely poor prognosis.  Outpatient palliative care follow-up for ongoing conversations regarding [place]. Remained stable and awaiting DC to  SNF, hopefully on Monday 12/18.   Tracheobronchomalacia Noted on CT during this admission seen by pulmonary, plan is to continue on BiPAP at bedtime, oxygen support and outpatient pulmonary follow-up.  SNF has returned patient's BiPAP and were not holding her room  and will have to do this again.  Tolerated BiPAP last night.  Hypertension: Controlled on amlodipine.   Anxiety/depression  Stable. Continue olanzapine and Ativan.   History of breast cancer status post-lumpectomy Continue anastrozole   GOC Will request OP Palliative support on discharge --> given the need for ongoing conversations related to goals of care  Body mass index is 27.34 kg/m.   DVT prophylaxis: enoxaparin (LOVENOX) injection 40 mg Start: 04/09/22 1215     Code Status: Full Code:  Family Communication: Daughter via phone 12/15. Disposition:  Patient is medically optimized for DC pending SNF, hopefully 12/18     Consultants:   Palliative care medicine PCCM  Procedures:     Antimicrobials:      Subjective:  Had a loose BM this morning.  No change in breathing.  Objective:   Vitals:   05/09/22 2321 05/10/22 0334 05/10/22 0439 05/10/22 0800  BP:  111/75  116/76  Pulse: 86 84 98 100  Resp: '18 18 16 16  '$ Temp:  97.6 F (36.4 C)  97.8 F (36.6 C)  TempSrc:  Oral  Oral  SpO2: 99% 90%  100%  Weight:      Height:        General exam: Elderly female, moderately built and nourished, sitting up comfortably in chair without distress.   Respiratory system: Distant breath sounds but clear to auscultation.  No increased work of breathing. Cardiovascular system: S1 & S2 heard, RRR. No JVD, murmurs, rubs, gallops or clicks. No pedal edema.  Not on telemetry. Gastrointestinal system: Abdomen is nondistended, soft and nontender. No organomegaly or masses felt. Normal bowel sounds heard. Central nervous system: Alert and oriented. No focal neurological deficits. Extremities: Symmetric 5 x 5 power. Skin: No rashes, lesions or ulcers Psychiatry: Judgement and insight appear normal. Mood & affect appropriate.     Data Reviewed:   I have personally reviewed following labs and imaging studies   CBC: Recent Labs  Lab 05/08/22 0123  WBC 10.2  HGB 11.9*  HCT  36.6  MCV 96.8  PLT 628    Basic Metabolic Panel: Recent Labs  Lab 05/08/22 0123  NA 138  K 3.6  CL 98  CO2 31  GLUCOSE 101*  BUN 14  CREATININE 0.68  CALCIUM 8.9    Liver Function Tests: Recent Labs  Lab 05/08/22 0123  AST 14*  ALT 31  ALKPHOS 60  BILITOT 1.2  PROT 5.4*  ALBUMIN 3.2*    CBG: No results for input(s): "GLUCAP" in the last 168 hours.  Microbiology Studies:  No results found for this or any previous visit (from the past 240 hour(s)).  Radiology Studies:  No results found.  Scheduled Meds:    amLODipine  2.5 mg Oral Daily   anastrozole  1 mg Oral Daily   arformoterol  15 mcg Nebulization BID   azithromycin  500 mg Oral Once per day on Mon Wed Fri   enoxaparin (LOVENOX) injection  40 mg Subcutaneous Q24H   furosemide  20 mg Oral Daily   guaiFENesin  1,200 mg Oral BID   lidocaine  1 patch Transdermal Daily   loratadine  10 mg Oral Daily   montelukast  10 mg Oral QHS   OLANZapine  7.5 mg Oral Daily   pantoprazole  40 mg Oral BID   polyethylene glycol  17 g Oral BID   predniSONE  40 mg Oral Q breakfast   revefenacin  175 mcg Nebulization Daily   senna-docusate  1 tablet Oral BID    Continuous Infusions:     LOS: 31 days     Vernell Leep, MD,  FACP, FHM, SFHM, [facility], [place]     To contact the attending provider between 7A-7P or the covering provider during after hours 7P-7A, please log into the web site [email or web address] and access using universal [place] password for that web site. If you do not have the password, please call the hospital operator.  05/10/2022, 9:31 AM

## 2022-05-10 NOTE — TOC Progression Note (Signed)
Transition of Care [facility]) - Progression Note    Patient Details  Name: Abigail Castillo MRN: [id number] Date of Birth: 03/14/1951  Transition of Care [facility]) CM/SW [place], [place] Phone Number: 05/10/2022, 2:38 PM  Clinical Narrative:     Patient has SNF bed at [facility] on Monday. Insurance authorization approved. TOC will continue to follow and assist with patients dc planning needs.   Expected Discharge Plan: [place] Barriers to Discharge: No Barriers Identified  Expected Discharge Plan and Services Expected Discharge Plan: [place]   Discharge Planning Services: CM Consult Post Acute Care Choice: Durable Medical Equipment, Home Health Living arrangements for the past 2 months: Mobile Home Expected Discharge Date: 05/06/22               DME Arranged: Nebulizer machine, Oxygen, Bipap DME Agency: [employer] Date DME Agency Contacted: 04/15/22 Time DME Agency Contacted: 1430 Representative spoke with at DME Agency: Erasmo Downer HH Arranged: PT [place]: [place] Date [place]: 04/18/22 Time [place]: 67 Representative spoke with at [place]: [place] Determinants of Health ([place]) Interventions    Readmission Risk Interventions     No data to display

## 2022-05-11 DIAGNOSIS — J441 Chronic obstructive pulmonary disease with (acute) exacerbation: Secondary | ICD-10-CM | POA: Diagnosis not present

## 2022-05-11 NOTE — Progress Notes (Signed)
   05/11/22 1100  Mobility  Activity Transferred from bed to chair;Transferred to/from [facility]  Level of Assistance Modified independent, requires aide device or extra time  Assistive Device  (HHA)  Distance Ambulated (ft) 4 ft  Activity Response Tolerated well  Mobility Referral Yes  $Mobility charge 1 Mobility   Mobility Specialist Progress Note  Post-Mobility: 89% SpO2  Pt requesting to get in the chair and then [facility] for a BM. Requesting to give her some time before getting back to chair. RN notified.   Abigail Castillo Mobility Specialist  Please contact via SecureChat or Rehab office at [phone]

## 2022-05-11 NOTE — Progress Notes (Signed)
Patient O2 sat 66% on 4L HFNC. Respiratory notified. O2 sat currently 91% on 10L HFNC.   Daymon Larsen, RN

## 2022-05-11 NOTE — Progress Notes (Signed)
PROGRESS NOTE   Abigail Castillo  HKV:[id number]    DOB: 11/03/1950    DOA: 04/09/2022  PCP: [name], NP   I have briefly reviewed patients previous medical records in [facility].  Chief Complaint  Patient presents with   Cough   Shortness of Breath    Brief Narrative:   71 year old woman PMH COPD, chronic hypoxic respiratory failure on 2-3 L, moderate pulmonary hypertension presented with shortness of breath.  Seen by pulmonary, treated with bronchodilators, steroids for acute COPD exacerbation complicated by tracheobronchomalacia.  Plan for BiPAP at home.  Planning for SNF once respiratory status improved. 11/15 admit for COPD exacerbation 11/16 pulmonology consult 12/1 per PCCM advised to discharge on BIPAP QHS and PRN for shortness of breath 10/5, Bleed in 3-4 L of oxygen/min, f/u at pulm office. Discharge planned, but decompensated, PCCM re-involved. 12/2 COPD with acute exacerbation, seems recurrent after reducing steroid dose.  12/2-12/12 slow improvement but became acutely short of breath, desaturated, oxygen requirement went up to 8 L/min HFNC on 12/12 holding potential discharge. Chronic tenuous respiratory status which may be close to her baseline.  Medically optimized for DC but SNF is to reorder her BiPAP, reevaluation by PT and OT and insurance reauthorization needed prior to DC.  Delays. Insurance had denied her SNF admission.  I did a peer to peer review with Dr. Ellison Carwin, insurance medical director on 12/15 and was able to overturn the denial.  Now approved for SNF admission but [facility] SNF unable to take her over the weekend.   Assessment & Plan:  Principal Problem:   COPD (chronic obstructive pulmonary disease) (HCC) Active Problems:   Depression   GERD (gastroesophageal reflux disease)   Acute on chronic respiratory failure with hypoxia (HCC)   Ductal carcinoma in situ (DCIS) of left breast   Hyponatremia   Bronchomalacia   Localized  edema   COPD exacerbation (HCC)   Tracheobronchomalacia   Acute on chronic respiratory failure with hypoxia and hypercapnia (HCC)   Acute exacerbation of severe COPD Acute on chronic hypoxic respiratory failure (2-3L as outpatient): CT without complicating features.  Treated with antibiotics.   No pulmonology follow-up noted and appointments on epic.  Reached out to Dr. Erskine Emery, PCCM who will assist with follow-up appointment with Dr. Lamonte Sakai, primary pulmonologist. Given her advanced COPD, it is anticipated that she will be steroid-dependent.  As communicated with PCCM on 12/14, taper prednisone by 10 mg weekly to a maintenance dose of 10 mg daily.  PCCM can manage this further during outpatient follow-up.  She was not on steroids PTA. Continue azithromycin 3 days/week-plan on this at discharge Continue Singulair and Claritin.  If her sinus disease is not well-controlled, can add Flonase or azelastine. Continue to yupelri & brovana.  Continue DuoNebs every 4 hours on top of the long-acting meds.  Patient has not been using BiPAP in the daytime.  Confirmed with PCCM on 12/14 that patient can DC only on scheduled BiPAP nightly.  Tolerated BiPAP overnight On 12/12, she got acutely dyspneic, desaturated with oxygen requirement up to 8 L/min HFNC.  Has been weaned down to 3 L/min [place] oxygen.  Baseline O2 use at home per patient is 2.5-3 L and is at baseline now. Palliative care input 12/10 appreciated: Full code, full scope of care but need to have ongoing discussions regarding goals of care given her extremely poor prognosis.  Outpatient palliative care follow-up for ongoing conversations regarding [place]. Remained stable and awaiting DC to  SNF, hopefully on Monday 12/18.  Oxygen saturations on morning of 12/17 was 66% on 4 L/min HFNC, HFNC was increased to 10 L/min.  Subsequently RRT advised that this was due to lack of water in the humidifier and once that was refilled, promptly was able to wean her back  to 4 L/min.   Tracheobronchomalacia Noted on CT during this admission seen by pulmonary, plan is to continue on BiPAP at bedtime, oxygen support and outpatient pulmonary follow-up.  SNF has returned patient's BiPAP and were not holding her room and will have to do this again.  Tolerated BiPAP last night.  Hypertension: Controlled on amlodipine.   Anxiety/depression  Stable. Continue olanzapine and Ativan.   History of breast cancer status post-lumpectomy Continue anastrozole   GOC Will request OP Palliative support on discharge --> given the need for ongoing conversations related to goals of care  Body mass index is 27.34 kg/m.   DVT prophylaxis: enoxaparin (LOVENOX) injection 40 mg Start: 04/09/22 1215     Code Status: Full Code:  Family Communication: Daughter via phone 12/15. Disposition:  Patient is medically optimized for DC pending SNF, hopefully 12/18     Consultants:   Palliative care medicine PCCM  Procedures:     Antimicrobials:      Subjective:  Seen along with RT in the room.  Back down to 4 L/min HFNC oxygen which had been increased earlier due to O2 saturation of 66%.  No worsening dyspnea.  Breathing at baseline.  Objective:   Vitals:   05/11/22 0803 05/11/22 0810 05/11/22 0901 05/11/22 1134  BP: (!) 146/95   111/72  Pulse: (!) 106   95  Resp: 20   18  Temp: 97.8 F (36.6 C)   97.6 F (36.4 C)  TempSrc: Oral   Oral  SpO2: 90% 99% 92% 96%  Weight:      Height:        General exam: Elderly female, moderately built and nourished, sitting up comfortably in chair without distress.   Respiratory system: Distant breath sounds but clear to auscultation.  No increased work of breathing.  Findings stable and unchanged over the last 2 days. Cardiovascular system: S1 & S2 heard, RRR. No JVD, murmurs, rubs, gallops or clicks. No pedal edema.  Not on telemetry. Gastrointestinal system: Abdomen is nondistended, soft and nontender. No organomegaly or  masses felt. Normal bowel sounds heard. Central nervous system: Alert and oriented. No focal neurological deficits. Extremities: Symmetric 5 x 5 power. Skin: No rashes, lesions or ulcers Psychiatry: Judgement and insight appear normal. Mood & affect appropriate.     Data Reviewed:   I have personally reviewed following labs and imaging studies   CBC: Recent Labs  Lab 05/08/22 0123  WBC 10.2  HGB 11.9*  HCT 36.6  MCV 96.8  PLT 213    Basic Metabolic Panel: Recent Labs  Lab 05/08/22 0123  NA 138  K 3.6  CL 98  CO2 31  GLUCOSE 101*  BUN 14  CREATININE 0.68  CALCIUM 8.9    Liver Function Tests: Recent Labs  Lab 05/08/22 0123  AST 14*  ALT 31  ALKPHOS 60  BILITOT 1.2  PROT 5.4*  ALBUMIN 3.2*    CBG: No results for input(s): "GLUCAP" in the last 168 hours.  Microbiology Studies:  No results found for this or any previous visit (from the past 240 hour(s)).  Radiology Studies:  No results found.  Scheduled Meds:    amLODipine  2.5 mg Oral  Daily   anastrozole  1 mg Oral Daily   arformoterol  15 mcg Nebulization BID   azithromycin  500 mg Oral Once per day on Mon Wed Fri   enoxaparin (LOVENOX) injection  40 mg Subcutaneous Q24H   furosemide  20 mg Oral Daily   guaiFENesin  1,200 mg Oral BID   lidocaine  1 patch Transdermal Daily   loratadine  10 mg Oral Daily   montelukast  10 mg Oral QHS   OLANZapine  7.5 mg Oral Daily   pantoprazole  40 mg Oral BID   polyethylene glycol  17 g Oral BID   predniSONE  40 mg Oral Q breakfast   revefenacin  175 mcg Nebulization Daily   senna-docusate  1 tablet Oral BID    Continuous Infusions:     LOS: 32 days     Vernell Leep, MD,  FACP, FHM, SFHM, [facility], [place]     To contact the attending provider between 7A-7P or the covering provider during after hours 7P-7A, please log into the web site [email or web address] and access using universal [place]  password for that web site. If you do not have the password, please call the hospital operator.  05/11/2022, 12:50 PM

## 2022-05-12 DIAGNOSIS — Z9911 Dependence on respirator [ventilator] status: Secondary | ICD-10-CM | POA: Diagnosis not present

## 2022-05-12 DIAGNOSIS — Z1152 Encounter for screening for COVID-19: Secondary | ICD-10-CM | POA: Diagnosis not present

## 2022-05-12 DIAGNOSIS — J9601 Acute respiratory failure with hypoxia: Secondary | ICD-10-CM | POA: Diagnosis not present

## 2022-05-12 DIAGNOSIS — J440 Chronic obstructive pulmonary disease with acute lower respiratory infection: Secondary | ICD-10-CM | POA: Diagnosis not present

## 2022-05-12 DIAGNOSIS — I10 Essential (primary) hypertension: Secondary | ICD-10-CM | POA: Diagnosis not present

## 2022-05-12 DIAGNOSIS — J9602 Acute respiratory failure with hypercapnia: Secondary | ICD-10-CM | POA: Diagnosis not present

## 2022-05-12 DIAGNOSIS — E559 Vitamin D deficiency, unspecified: Secondary | ICD-10-CM | POA: Diagnosis not present

## 2022-05-12 DIAGNOSIS — J9690 Respiratory failure, unspecified, unspecified whether with hypoxia or hypercapnia: Secondary | ICD-10-CM | POA: Diagnosis not present

## 2022-05-12 DIAGNOSIS — R1312 Dysphagia, oropharyngeal phase: Secondary | ICD-10-CM | POA: Diagnosis not present

## 2022-05-12 DIAGNOSIS — E785 Hyperlipidemia, unspecified: Secondary | ICD-10-CM | POA: Diagnosis not present

## 2022-05-12 DIAGNOSIS — E440 Moderate protein-calorie malnutrition: Secondary | ICD-10-CM | POA: Diagnosis not present

## 2022-05-12 DIAGNOSIS — I63342 Cerebral infarction due to thrombosis of left cerebellar artery: Secondary | ICD-10-CM | POA: Diagnosis not present

## 2022-05-12 DIAGNOSIS — J439 Emphysema, unspecified: Secondary | ICD-10-CM | POA: Diagnosis not present

## 2022-05-12 DIAGNOSIS — J159 Unspecified bacterial pneumonia: Secondary | ICD-10-CM | POA: Diagnosis present

## 2022-05-12 DIAGNOSIS — F259 Schizoaffective disorder, unspecified: Secondary | ICD-10-CM | POA: Diagnosis present

## 2022-05-12 DIAGNOSIS — Z515 Encounter for palliative care: Secondary | ICD-10-CM | POA: Diagnosis not present

## 2022-05-12 DIAGNOSIS — I272 Pulmonary hypertension, unspecified: Secondary | ICD-10-CM | POA: Diagnosis not present

## 2022-05-12 DIAGNOSIS — Z930 Tracheostomy status: Secondary | ICD-10-CM | POA: Diagnosis not present

## 2022-05-12 DIAGNOSIS — K760 Fatty (change of) liver, not elsewhere classified: Secondary | ICD-10-CM | POA: Diagnosis not present

## 2022-05-12 DIAGNOSIS — L89159 Pressure ulcer of sacral region, unspecified stage: Secondary | ICD-10-CM | POA: Diagnosis not present

## 2022-05-12 DIAGNOSIS — M6259 Muscle wasting and atrophy, not elsewhere classified, multiple sites: Secondary | ICD-10-CM | POA: Diagnosis not present

## 2022-05-12 DIAGNOSIS — M6281 Muscle weakness (generalized): Secondary | ICD-10-CM | POA: Diagnosis not present

## 2022-05-12 DIAGNOSIS — I63542 Cerebral infarction due to unspecified occlusion or stenosis of left cerebellar artery: Secondary | ICD-10-CM | POA: Diagnosis not present

## 2022-05-12 DIAGNOSIS — J121 Respiratory syncytial virus pneumonia: Secondary | ICD-10-CM | POA: Diagnosis present

## 2022-05-12 DIAGNOSIS — J9621 Acute and chronic respiratory failure with hypoxia: Secondary | ICD-10-CM | POA: Diagnosis not present

## 2022-05-12 DIAGNOSIS — F32A Depression, unspecified: Secondary | ICD-10-CM | POA: Diagnosis not present

## 2022-05-12 DIAGNOSIS — J9611 Chronic respiratory failure with hypoxia: Secondary | ICD-10-CM | POA: Diagnosis not present

## 2022-05-12 DIAGNOSIS — D0512 Intraductal carcinoma in situ of left breast: Secondary | ICD-10-CM | POA: Diagnosis not present

## 2022-05-12 DIAGNOSIS — Z9981 Dependence on supplemental oxygen: Secondary | ICD-10-CM | POA: Diagnosis not present

## 2022-05-12 DIAGNOSIS — E871 Hypo-osmolality and hyponatremia: Secondary | ICD-10-CM | POA: Diagnosis not present

## 2022-05-12 DIAGNOSIS — M8000XS Age-related osteoporosis with current pathological fracture, unspecified site, sequela: Secondary | ICD-10-CM | POA: Diagnosis not present

## 2022-05-12 DIAGNOSIS — R2681 Unsteadiness on feet: Secondary | ICD-10-CM | POA: Diagnosis not present

## 2022-05-12 DIAGNOSIS — R0682 Tachypnea, not elsewhere classified: Secondary | ICD-10-CM | POA: Diagnosis not present

## 2022-05-12 DIAGNOSIS — E870 Hyperosmolality and hypernatremia: Secondary | ICD-10-CM | POA: Diagnosis not present

## 2022-05-12 DIAGNOSIS — J449 Chronic obstructive pulmonary disease, unspecified: Secondary | ICD-10-CM | POA: Diagnosis not present

## 2022-05-12 DIAGNOSIS — E876 Hypokalemia: Secondary | ICD-10-CM | POA: Diagnosis not present

## 2022-05-12 DIAGNOSIS — Z741 Need for assistance with personal care: Secondary | ICD-10-CM | POA: Diagnosis not present

## 2022-05-12 DIAGNOSIS — K222 Esophageal obstruction: Secondary | ICD-10-CM | POA: Diagnosis not present

## 2022-05-12 DIAGNOSIS — E1165 Type 2 diabetes mellitus with hyperglycemia: Secondary | ICD-10-CM | POA: Diagnosis not present

## 2022-05-12 DIAGNOSIS — C50919 Malignant neoplasm of unspecified site of unspecified female breast: Secondary | ICD-10-CM | POA: Diagnosis not present

## 2022-05-12 DIAGNOSIS — R0602 Shortness of breath: Secondary | ICD-10-CM | POA: Diagnosis not present

## 2022-05-12 DIAGNOSIS — R9431 Abnormal electrocardiogram [ECG] [EKG]: Secondary | ICD-10-CM | POA: Diagnosis not present

## 2022-05-12 DIAGNOSIS — Z743 Need for continuous supervision: Secondary | ICD-10-CM | POA: Diagnosis not present

## 2022-05-12 DIAGNOSIS — J441 Chronic obstructive pulmonary disease with (acute) exacerbation: Secondary | ICD-10-CM | POA: Diagnosis not present

## 2022-05-12 DIAGNOSIS — J9622 Acute and chronic respiratory failure with hypercapnia: Secondary | ICD-10-CM | POA: Diagnosis not present

## 2022-05-12 DIAGNOSIS — R29701 NIHSS score 1: Secondary | ICD-10-CM | POA: Diagnosis not present

## 2022-05-12 MED ORDER — ALBUTEROL SULFATE (2.5 MG/3ML) 0.083% IN NEBU: 2.5000 mg | INHALATION_SOLUTION | RESPIRATORY_TRACT | Status: DC | PRN

## 2022-05-12 MED ORDER — PREDNISONE 10 MG PO TABS: ORAL_TABLET | ORAL | Status: DC

## 2022-05-12 NOTE — Care Management Important Message (Signed)
Important Message  Patient Details  Name: Abigail Castillo MRN: [id number] Date of Birth: Dec 02, 1950   Medicare Important Message Given:  Yes     Shelda Altes 05/12/2022, 10:36 AM

## 2022-05-12 NOTE — TOC Transition Note (Signed)
Transition of Care [facility]) - CM/SW Discharge Note   Patient Details  Name: Abigail Castillo MRN: [id number] Date of Birth: 1951-01-28  Transition of Care [facility]) CM/SW Contact:  Vinie Sill, LCSW Phone Number: 05/12/2022, 1:57 PM   Clinical Narrative:     Patient will Discharge to: [facility] Discharge Date: 05/12/2022 Family Notified: granddaughter Transport By: Corey Harold  Per MD patient is ready for discharge. RN, patient, and facility notified of discharge. Discharge Summary sent to facility. RN given number for [phone]. Ambulance transport requested for patient.   Clinical Social Worker signing off.  Thurmond Butts, MSW, LCSW Clinical Social Worker     Final next level of care: Skilled Nursing Facility Barriers to Discharge: Barriers Resolved   Patient Goals and CMS Choice Patient states their goals for this hospitalization and ongoing recovery are:: return home   Choice offered to / list presented to : Patient  Discharge Placement              Patient chooses bed at: [facility] Rehab Patient to be transferred to facility by: [place] Name of family member notified: granddaughter Patient and family notified of of transfer: 05/12/22  Discharge Plan and Services   Discharge Planning Services: CM Consult Post Acute Care Choice: Durable Medical Equipment, Home Health          DME Arranged: Nebulizer machine, Oxygen, Bipap DME Agency: [employer] Date DME Agency Contacted: 04/15/22 Time DME Agency Contacted: 1430 Representative spoke with at DME Agency: Erasmo Downer HH Arranged: PT [place]: [place] Date [place]: 04/18/22 Time [place]: 1430 Representative spoke with at [place]: Claiborne Bill[place]  Social Determinants of Health ([place]) Interventions     Readmission Risk Interventions     No data to display

## 2022-05-12 NOTE — Progress Notes (Signed)
Mobility Specialist Progress Note:   05/12/22 0939  Mobility  Activity Ambulated with assistance in hallway  Level of Assistance Modified independent, requires aide device or extra time  Assistive Device Front wheel walker  Distance Ambulated (ft) 40 ft  Activity Response Tolerated well  $Mobility charge 1 Mobility   Pt received in chair willing to participate in mobility. No complaints of pain. Left in chair with call bell in reach and all needs met.     Mobility Specialist Please contact via Secure Chat or  Rehab Office at [phone]  

## 2022-05-12 NOTE — Discharge Instructions (Signed)

## 2022-05-12 NOTE — Progress Notes (Signed)
Patient refused use of CPAP for the evening 

## 2022-05-12 NOTE — TOC Progression Note (Signed)
Transition of Care [facility]) - Progression Note    Patient Details  Name: Abigail Castillo MRN: [id number] Date of Birth: [birth year]  Transition of Care [facility]) CM/SW [place], [place] Phone Number: 05/12/2022, 8:45 AM  Clinical Narrative:     CSW sent message to [facility] to confirm  if they have bi-pap. Awaiting response.  Thurmond Butts, MSW, LCSW Clinical Social Worker    Expected Discharge Plan: Skilled Nursing Facility Barriers to Discharge: No Barriers Identified  Expected Discharge Plan and Services Expected Discharge Plan: [place]   Discharge Planning Services: CM Consult Post Acute Care Choice: Durable Medical Equipment, Home Health Living arrangements for the past 2 months: Mobile Home Expected Discharge Date: 05/06/22               DME Arranged: Nebulizer machine, Oxygen, Bipap DME Agency: [employer] Date DME Agency Contacted: 04/15/22 Time DME Agency Contacted: 71 Representative spoke with at DME Agency: Erasmo Downer [place]: PT [place]: [place] Date [place]: 04/18/22 Time [place]: 20 Representative spoke with at [place]: [place] Determinants of Health ([place]) Interventions    Readmission Risk Interventions     No data to display

## 2022-05-12 NOTE — Progress Notes (Signed)
Report given to [facility] at [facility]

## 2022-05-12 NOTE — Discharge Summary (Signed)
Physician Discharge Summary  Abigail Castillo QIW:[id number] DOB: [birth year]  PCP: [name], NP  Admitted from: Home Discharged to: SNF  Admit date: 04/09/2022 Discharge date: 05/12/2022  Recommendations for Outpatient Follow-up:    Contact information for follow-up providers     [name], NP Follow up.   Specialty: Internal Medicine Why: To be seen upon discharge from SNF. Contact information: [place] [address] [phone]         Collene Gobble, MD Follow up on 05/16/2022.   Specialty: Pulmonary Disease Why: 2 pm. SNF to arrange transport. Contact information:  [place] [address] [phone]         MD at SNF. Schedule an appointment as soon as possible for a visit.   Why: To be seen in 3 to 4 days with repeat labs (CBC & BMP).  Needs scheduled BiPAP at bedtime.  Kindly ensure that she keeps up her outpatient pulmonology appointment on 03/16/2022.  Recommend palliative care consultation and follow-up at SNF.             Contact information for after-discharge care     Destination     [facility] Preferred SNF .   Service: Skilled Nursing Contact information: [address] [place]: None.  Patient DC into SNF.  Equipment/Devices:     Museum/gallery conservator  (From admission, onward)           Start     Ordered   04/25/22 1612  For home use only DME Bipap  Once       Comments: BIPap for tracheomalacia. Cont 10/5, bleed 3-4l/min of oxygen  Question Answer Comment  Length of Need Lifetime   Bleed in oxygen (LPM) 3-4   Inspiratory pressure 10   Expiratory pressure 5      04/25/22 1612   04/16/22 1253  For home use only DME Tub bench  Once        04/16/22 1253   04/15/22 1331  For home use only DME Nebulizer machine  Once       Question Answer  Comment  Patient needs a nebulizer to treat with the following condition COPD (chronic obstructive pulmonary disease) ([place])   Length of Need 6 Months      04/15/22 1331             Discharge Condition: Improved and stable.   Code Status: Full Code Diet recommendation:  Discharge Diet Orders (From admission, onward)     Start     Ordered   05/12/22 0000  Diet - low sodium heart healthy        05/12/22 1251             Discharge Diagnoses:  Principal Problem:   COPD (chronic obstructive pulmonary disease) ([place]) Active Problems:   Depression   GERD (gastroesophageal reflux disease)   Acute on chronic respiratory failure with hypoxia (HCC)   Ductal carcinoma in situ (DCIS) of left breast   Hyponatremia   Bronchomalacia   Localized edema   COPD exacerbation (HCC)   Tracheobronchomalacia   Acute on chronic respiratory failure with hypoxia and hypercapnia (HCC)   Brief Summary: 71 year old woman PMH COPD, chronic hypoxic respiratory failure on 2-3 L, moderate pulmonary hypertension presented with shortness of  breath.  Seen by pulmonary, treated with bronchodilators, steroids for acute COPD exacerbation complicated by tracheobronchomalacia.  Plan for BiPAP at home.  Planning for SNF once respiratory status improved. 11/15 admit for COPD exacerbation 11/16 pulmonology consult 12/1 per PCCM advised to discharge on BIPAP QHS and PRN for shortness of breath 10/5, Bleed in 3-4 L of oxygen/min, f/u at pulm office. Discharge planned, but decompensated, PCCM re-involved. 12/2 COPD with acute exacerbation, seems recurrent after reducing steroid dose.  12/2-12/12 slow improvement but became acutely short of breath, desaturated, oxygen requirement went up to 8 L/min HFNC on 12/12 holding potential discharge. Chronic tenuous respiratory status which may be close to her baseline.  Medically optimized for DC but SNF is to reorder her BiPAP, reevaluation by PT and OT and insurance  reauthorization needed prior to DC.  Delays. Insurance had denied her SNF admission.  A peer to peer review was done with Dr. Ellison Carwin, insurance medical director on 12/15 and was able to overturn the denial.       Assessment & Plan:    Acute exacerbation of severe COPD Acute on chronic hypoxic respiratory failure (2-3L as outpatient): CT without complicating features.  Treated with antibiotics.   Given her advanced COPD, it is anticipated that she will be steroid-dependent.  As communicated with PCCM on 12/14, taper prednisone by 10 mg weekly to a maintenance dose of 10 mg daily.  PCCM can manage this further during outpatient follow-up.  She was not on steroids PTA. Continue azithromycin 3 days/week-plan on this at discharge Continue Singulair and Claritin.  If her sinus disease is not well-controlled, can add Flonase or azelastine. Continue to yupelri & brovana.  Continue DuoNebs every 4 hours on top of the long-acting meds.  Patient has not been using BiPAP in the daytime.  Confirmed with PCCM on 12/14 that patient can DC only on scheduled BiPAP nightly.  Tolerated BiPAP overnight On 12/12, she got acutely dyspneic, desaturated with oxygen requirement up to 8 L/min HFNC.  Has been weaned down to 3 L/min [place] oxygen.  Baseline O2 use at home per patient is 2.5-3 L and is at baseline now. Palliative care input 12/10 appreciated: Full code, full scope of care but need to have ongoing discussions regarding goals of care given her extremely poor prognosis.  Outpatient palliative care follow-up for ongoing conversations regarding [name]. Remained stable and awaiting DC to SNF since 12/15.   Now has PCCM follow-up appointment in the office on 12/22.   Tracheobronchomalacia Noted on CT during this admission seen by pulmonary, plan is to continue on BiPAP at bedtime, oxygen support and outpatient pulmonary follow-up.     Hypertension: Controlled on amlodipine low-dose.   Anxiety/depression   Stable. Continue olanzapine.  Has not required as needed Ativan for several days now and will not be discharged on this.   History of breast cancer status post-lumpectomy Continue anastrozole   GOC Will request OP Palliative support on discharge --> given the need for ongoing conversations related to goals of care   Body mass index is 27.34 kg/m.     Consultants:   Palliative care medicine PCCM   Procedures:   BiPAP at bedtime    Discharge Instructions  Discharge Instructions     (HEART FAILURE PATIENTS) Call MD:  Anytime you have any of the following symptoms: 1) 3 pound weight gain in 24 hours or 5 pounds in 1 week 2) shortness of breath, with or without a dry hacking cough 3) swelling  in the hands, feet or stomach 4) if you have to sleep on extra pillows at night in order to breathe.   Complete by: As directed    Ambulatory Referral for Lung Cancer Scre   Complete by: As directed    Call MD for:  difficulty breathing, headache or visual disturbances   Complete by: As directed    Call MD for:  extreme fatigue   Complete by: As directed    Call MD for:  persistant dizziness or light-headedness   Complete by: As directed    Call MD for:  persistant nausea and vomiting   Complete by: As directed    Call MD for:  severe uncontrolled pain   Complete by: As directed    Call MD for:  temperature >100.4   Complete by: As directed    Diet - low sodium heart healthy   Complete by: As directed    Discharge instructions   Complete by: As directed    Continue scheduled BiPAP at bedtime.   Increase activity slowly   Complete by: As directed         Medication List     STOP taking these medications    ibuprofen 800 MG tablet Commonly known as: ADVIL   LORazepam 1 MG tablet Commonly known as: ATIVAN   Stiolto Respimat 2.5-2.5 MCG/ACT Aers Generic drug: Tiotropium Bromide-Olodaterol       TAKE these medications    AeroChamber MV inhaler Use as instructed    albuterol 108 (90 Base) MCG/ACT inhaler Commonly known as: VENTOLIN HFA INHALE TWO PUFFS BY MOUTH INTO THE LUNGS EVERY 6 HOURS AS NEEDED FOR WHEEZING OR SHORTNESS OF BREATH What changed: See the new instructions.   albuterol (2.5 MG/3ML) 0.083% nebulizer solution Commonly known as: PROVENTIL Take 3 mLs (2.5 mg total) by nebulization every 2 (two) hours as needed for wheezing or shortness of breath. What changed: You were already taking a medication with the same name, and this prescription was added. Make sure you understand how and when to take each.   amLODipine 2.5 MG tablet Commonly known as: NORVASC Take 1 tablet (2.5 mg total) by mouth daily.   anastrozole 1 MG tablet Commonly known as: ARIMIDEX TAKE ONE TABLET BY MOUTH DAILY   arformoterol 15 MCG/2ML Nebu Commonly known as: BROVANA Take 2 mLs (15 mcg total) by nebulization 2 (two) times daily.   azithromycin 500 MG tablet Commonly known as: ZITHROMAX Take 1 tablet (500 mg total) by mouth 3 (three) times a week.   Calcium Carbonate-Vitamin D 600-400 MG-UNIT tablet Take 1 tablet by mouth 2 (two) times daily.   cholecalciferol 1000 units tablet Commonly known as: VITAMIN D Take 3,000 Units by mouth daily.   cyanocobalamin 1000 MCG tablet Commonly known as: VITAMIN B12 TAKE ONE TABLET BY MOUTH DAILY What changed: how much to take   dexlansoprazole 60 MG capsule Commonly known as: DEXILANT TAKE ONE CAPSULE BY MOUTH DAILY   furosemide 20 MG tablet Commonly known as: LASIX Take 1 tablet (20 mg total) by mouth daily.   ipratropium-albuterol 0.5-2.5 (3) MG/3ML Soln Commonly known as: DUONEB Take 3 mLs by nebulization in the morning, at noon, in the evening, and at bedtime.   loratadine 10 MG tablet Commonly known as: CLARITIN Take 1 tablet (10 mg total) by mouth daily.   montelukast 10 MG tablet Commonly known as: SINGULAIR Take 1 tablet (10 mg total) by mouth at bedtime.   OLANZapine 5 MG tablet Commonly  known as:  ZYPREXA Take 7.5 mg by mouth daily.   OXYGEN Inhale 3 L into the lungs.   predniSONE 10 MG tablet Commonly known as: DELTASONE Take 3 tabs (30 mg total) daily x 1 week, then 2 tabs (20 mg total) daily x 1 week, then continue 1 tab (10 Mg total) daily until office visit with pulmonology.   revefenacin 175 MCG/3ML nebulizer solution Commonly known as: YUPELRI Take 3 mLs (175 mcg total) by nebulization daily.   vitamin E 180 MG (400 UNITS) capsule Take 400 Units by mouth daily.       Allergies  Allergen Reactions   Sulfonamide Derivatives Hives      Procedures/Studies: DG CHEST PORT 1 VIEW  Result Date: 04/29/2022 CLINICAL DATA:  Abnormal respiration. EXAM: PORTABLE CHEST 1 VIEW COMPARISON:  Chest radiograph dated 04/25/2022. FINDINGS: Background of emphysema. There is shallow inspiration with bibasilar atelectasis. No focal consolidation, pleural effusion, or pneumothorax. The cardiac silhouette is within limits. Multiple surgical clips over the left chest wall. No acute osseous pathology. IMPRESSION: 1. No active disease. 2. Emphysema. Electronically Signed   By: Anner Crete M.D.   On: 04/29/2022 19:15   ECHOCARDIOGRAM COMPLETE BUBBLE STUDY  Result Date: 04/26/2022    ECHOCARDIOGRAM REPORT   Patient Name:   Abigail Castillo Date of Exam: 04/26/2022 Medical Rec #:  [id number]      Height:       62.0 in Accession #:    [id number]     Weight:       149.5 lb Date of Birth:  21-Mar-1951     BSA:          1.689 m Patient Age:    47 years       BP:           128/81 mmHg Patient Gender: F              HR:           106 bpm. Exam Location:  Inpatient Procedure: 2D Echo, Cardiac Doppler, Color Doppler and Intracardiac            Opacification Agent STAT ECHO Indications:    Hypoxia [300808]  History:        Patient has no prior history of Echocardiogram examinations.                 COPD, Signs/Symptoms:Shortness of Breath; Risk Factors:Current                 Smoker. Emphysema of  lung. GERD.  Sonographer:    Darlina Sicilian RDCS Referring Phys: [id number] Julian Hy  Sonographer Comments: Echo performed with patient sitting upright in chair. IMPRESSIONS  1. Left ventricular ejection fraction, by estimation, is 70 to 75%. The left ventricle has hyperdynamic function. The left ventricle has no regional wall motion abnormalities. There is mild asymmetric left ventricular hypertrophy of the basal-septal segment. Left ventricular diastolic parameters are consistent with Grade I diastolic dysfunction (impaired relaxation).  2. Right ventricular systolic function is normal. The right ventricular size is normal. There is mildly elevated pulmonary artery systolic pressure. The estimated right ventricular systolic pressure is 63.8 mmHg.  3. The mitral valve is grossly normal. No evidence of mitral valve regurgitation. No evidence of mitral stenosis.  4. The aortic valve is grossly normal. Aortic valve regurgitation is not visualized. No aortic stenosis is present.  5. The inferior vena cava is normal in size with greater than 50% respiratory variability, suggesting right atrial pressure  of 3 mmHg.  6. Agitated saline contrast bubble study was negative, with no evidence of any interatrial shunt. FINDINGS  Left Ventricle: Left ventricular ejection fraction, by estimation, is 70 to 75%. The left ventricle has hyperdynamic function. The left ventricle has no regional wall motion abnormalities. The left ventricular internal cavity size was small. There is mild asymmetric left ventricular hypertrophy of the basal-septal segment. Left ventricular diastolic parameters are consistent with Grade I diastolic dysfunction (impaired relaxation). Right Ventricle: The right ventricular size is normal. No increase in right ventricular wall thickness. Right ventricular systolic function is normal. There is mildly elevated pulmonary artery systolic pressure. The tricuspid regurgitant velocity is 3.05  m/s, and with an  assumed right atrial pressure of 3 mmHg, the estimated right ventricular systolic pressure is 35.0 mmHg. Left Atrium: Left atrial size was normal in size. Right Atrium: Right atrial size was normal in size. Pericardium: There is no evidence of pericardial effusion. Presence of epicardial fat layer. Mitral Valve: The mitral valve is grossly normal. No evidence of mitral valve regurgitation. No evidence of mitral valve stenosis. Tricuspid Valve: The tricuspid valve is grossly normal. Tricuspid valve regurgitation is trivial. No evidence of tricuspid stenosis. Aortic Valve: The aortic valve is grossly normal. Aortic valve regurgitation is not visualized. No aortic stenosis is present. Pulmonic Valve: The pulmonic valve was not well visualized. Pulmonic valve regurgitation is not visualized. No evidence of pulmonic stenosis. Aorta: The aortic root is normal in size and structure. Venous: The inferior vena cava is normal in size with greater than 50% respiratory variability, suggesting right atrial pressure of 3 mmHg. IAS/Shunts: No atrial level shunt detected by color flow Doppler. Agitated saline contrast was given intravenously to evaluate for intracardiac shunting. Agitated saline contrast bubble study was negative, with no evidence of any interatrial shunt.  LEFT VENTRICLE PLAX 2D LVIDd:         3.60 cm   Diastology LVIDs:         1.80 cm   LV e' medial:    9.19 cm/s LV PW:         0.70 cm   LV E/e' medial:  5.1 LV IVS:        1.10 cm   LV e' lateral:   11.00 cm/s LVOT diam:     1.80 cm   LV E/e' lateral: 4.3 LV SV:         49 LV SV Index:   29 LVOT Area:     2.54 cm  RIGHT VENTRICLE RV Basal diam:  3.40 cm RV Mid diam:    2.60 cm LEFT ATRIUM             Index LA diam:        2.70 cm 1.60 cm/m LA Vol (A2C):   15.5 ml 9.18 ml/m LA Vol (A4C):   10.8 ml 6.39 ml/m LA Biplane Vol: 13.2 ml 7.81 ml/m  AORTIC VALVE LVOT Vmax:   122.00 cm/s LVOT Vmean:  98.200 cm/s LVOT VTI:    0.193 m  AORTA Ao Root diam: 2.80 cm  MITRAL VALVE               TRICUSPID VALVE MV Area (PHT): 2.42 cm    TR Peak grad:   37.2 mmHg MV Decel Time: 314 msec    TR Vmax:        305.00 cm/s MV E velocity: 46.90 cm/s MV A velocity: 81.80 cm/s  SHUNTS MV E/A ratio:  0.57  Systemic VTI:  0.19 m                            Systemic Diam: 1.80 cm Candee Furbish MD Electronically signed by Candee Furbish MD Signature Date/Time: 04/26/2022/5:13:38 PM    Final    DG Chest Port 1 View  Result Date: 04/25/2022 CLINICAL DATA:  History of COPD.  Shortness of breath EXAM: PORTABLE CHEST 1 VIEW COMPARISON:  04/13/2022 FINDINGS: Redemonstrated findings of severe bolus emphysema. Unchanged cardiac and mediastinal contours. No pneumothorax. Persistent bibasilar reticular opacities, unchanged from prior exam. No new focal airspace opacities visualized. Visualized upper abdomen is unremarkable. No displaced rib fracture. IMPRESSION: Redemonstrated findings of severe bolus emphysema. No new focal airspace opacities visualized. Electronically Signed   By: Marin Roberts M.D.   On: 04/25/2022 10:56   CT Angio Chest Pulmonary Embolism (PE) W or WO Contrast  Result Date: 04/14/2022 CLINICAL DATA:  Evaluate for acute pulmonary embolus. EXAM: CT ANGIOGRAPHY CHEST WITH CONTRAST TECHNIQUE: Multidetector CT imaging of the chest was performed using the standard protocol during bolus administration of intravenous contrast. Multiplanar CT image reconstructions and MIPs were obtained to evaluate the vascular anatomy. RADIATION DOSE REDUCTION: This exam was performed according to the departmental dose-optimization program which includes automated exposure control, adjustment of the mA and/or kV according to patient size and/or use of iterative reconstruction technique. CONTRAST:  4m OMNIPAQUE IOHEXOL 350 MG/ML SOLN COMPARISON:  04/17/2010 FINDINGS: Cardiovascular: Satisfactory opacification of the pulmonary arteries to the segmental level. No evidence of pulmonary embolism.  Aortic atherosclerosis. Coronary artery calcifications. Normal heart size. No pericardial effusion. Mediastinum/Nodes: Thyroid gland, and esophagus appear unremarkable. There is decreased AP diameter of the trachea and bilateral mainstem bronchi concerning for chronic tracheobronchomalacia. No enlarged axillary, mediastinal, or hilar lymph nodes. Lungs/Pleura: Severe changes of emphysema. No pleural effusion. No airspace consolidation. Subpleural scarring noted within the posterolateral right middle lobe. Upper Abdomen: No acute findings. Small hiatal hernia. Status post cholecystectomy. Musculoskeletal: No acute or suspicious osseous findings. Mild chronic endplate deformities noted throughout the thoracic spine. Review of the MIP images confirms the above findings. IMPRESSION: 1. No evidence for acute pulmonary embolism. 2. Decreased AP diameter of the trachea and bilateral mainstem bronchi concerning for chronic tracheobronchomalacia. 3. Diffuse bronchial wall thickening with severe emphysema, as above; imaging findings suggestive of underlying COPD. Emphysema (ICD10-J43.9). 4. Coronary artery calcifications. 5. Small hiatal hernia. 6.  Aortic Atherosclerosis (ICD10-I70.0). Electronically Signed   By: TKerby MoorsM.D.   On: 04/14/2022 10:24   DG CHEST PORT 1 VIEW  Result Date: 04/13/2022 CLINICAL DATA:  Hypoxemia, dry throat EXAM: PORTABLE CHEST 1 VIEW COMPARISON:  Prior chest x-ray 04/09/2022 FINDINGS: Stable cardiac and mediastinal contours. Severe bullous emphysematous changes in both mid and upper lungs again noted. Mild chronic interstitial prominence in the lung bases. No pneumothorax or pleural effusion. No significant interval change. Surgical clips project over the left chest. IMPRESSION: Stable appearance of the chest with the out evidence of acute cardiopulmonary process. Electronically Signed   By: HJacqulynn CadetM.D.   On: 04/13/2022 07:35      Subjective: No new complaints.   Breathing is back to baseline.  Aware that she is [facility].  Discharge Exam:  Vitals:   05/12/22 0736 05/12/22 0737 05/12/22 1021 05/12/22 1230  BP:   (!) 144/78 128/70  Pulse:   (!) 103 (!) 120  Resp:   (!) 22 (!) 24  Temp:    98.9 F (37.2 C)  TempSrc:   Oral Oral  SpO2: 93% 93% 96% 92%  Weight:      Height:        General exam: Elderly female, moderately built and nourished, sitting up comfortably in chair without distress.   Respiratory system: Distant breath sounds but clear to auscultation without wheezing, rhonchi or crackles.  No increased work of breathing. Cardiovascular system: S1 & S2 heard, RRR. No JVD, murmurs, rubs, gallops or clicks. No pedal edema.  Not on telemetry. Gastrointestinal system: Abdomen is nondistended, soft and nontender. No organomegaly or masses felt. Normal bowel sounds heard. Central nervous system: Alert and oriented. No focal neurological deficits. Extremities: Symmetric 5 x 5 power. Skin: No rashes, lesions or ulcers Psychiatry: Judgement and insight appear normal. Mood & affect appropriate.     The results of significant diagnostics from this hospitalization (including imaging, microbiology, ancillary and laboratory) are listed below for reference.     Microbiology: No results found for this or any previous visit (from the past 240 hour(s)).   Labs: CBC: Recent Labs  Lab 05/08/22 0123  WBC 10.2  HGB 11.9*  HCT 36.6  MCV 96.8  PLT 283    Basic Metabolic Panel: Recent Labs  Lab 05/08/22 0123  NA 138  K 3.6  CL 98  CO2 31  GLUCOSE 101*  BUN 14  CREATININE 0.68  CALCIUM 8.9    Liver Function Tests: Recent Labs  Lab 05/08/22 0123  AST 14*  ALT 31  ALKPHOS 60  BILITOT 1.2  PROT 5.4*  ALBUMIN 3.2*     Urinalysis    Component Value Date/Time   COLORURINE AMBER (A) 07/06/2019 0712   APPEARANCEUR HAZY (A) 07/06/2019 0712   LABSPEC 1.034 (H) 07/06/2019 0712   PHURINE 5.0 07/06/2019  0712   GLUCOSEU 50 (A) 07/06/2019 0712   GLUCOSEU NEGATIVE 11/11/2017 1027   HGBUR NEGATIVE 07/06/2019 0712   BILIRUBINUR NEGATIVE 07/06/2019 0712   KETONESUR 20 (A) 07/06/2019 0712   PROTEINUR 30 (A) 07/06/2019 0712   UROBILINOGEN 0.2 11/11/2017 1027   NITRITE NEGATIVE 07/06/2019 0712   LEUKOCYTESUR NEGATIVE 07/06/2019 6629      Time coordinating discharge: 35 minutes  SIGNED:  Vernell Leep, MD,  FACP, FHM, SFHM, [facility], [place]     To contact the attending provider between 7A-7P or the covering provider during after hours 7P-7A, please log into the web site [email or web address] and access using universal [place] password for that web site. If you do not have the password, please call the hospital operator.

## 2022-05-13 DIAGNOSIS — C50919 Malignant neoplasm of unspecified site of unspecified female breast: Secondary | ICD-10-CM | POA: Diagnosis not present

## 2022-05-13 DIAGNOSIS — M8000XS Age-related osteoporosis with current pathological fracture, unspecified site, sequela: Secondary | ICD-10-CM | POA: Diagnosis not present

## 2022-05-13 DIAGNOSIS — J449 Chronic obstructive pulmonary disease, unspecified: Secondary | ICD-10-CM | POA: Diagnosis not present

## 2022-05-14 ENCOUNTER — Other Ambulatory Visit: Payer: Self-pay | Admitting: *Deleted

## 2022-05-14 DIAGNOSIS — J9621 Acute and chronic respiratory failure with hypoxia: Secondary | ICD-10-CM | POA: Diagnosis not present

## 2022-05-14 DIAGNOSIS — J9622 Acute and chronic respiratory failure with hypercapnia: Secondary | ICD-10-CM | POA: Diagnosis not present

## 2022-05-14 DIAGNOSIS — E785 Hyperlipidemia, unspecified: Secondary | ICD-10-CM | POA: Diagnosis not present

## 2022-05-14 DIAGNOSIS — K222 Esophageal obstruction: Secondary | ICD-10-CM | POA: Diagnosis not present

## 2022-05-14 DIAGNOSIS — J449 Chronic obstructive pulmonary disease, unspecified: Secondary | ICD-10-CM | POA: Diagnosis not present

## 2022-05-14 DIAGNOSIS — E559 Vitamin D deficiency, unspecified: Secondary | ICD-10-CM | POA: Diagnosis not present

## 2022-05-14 DIAGNOSIS — F32A Depression, unspecified: Secondary | ICD-10-CM | POA: Diagnosis not present

## 2022-05-14 NOTE — Patient Outreach (Signed)
Per [facility] Ms. Crandle resides in [place] [facility]. Screening for potential [facility] care coordination services as benefit of insurance plan and PCP.   Secure communication to SNF social worker team and therapy Freight forwarder to make aware writer is following for transition plans/needs.   Will continue to follow.   Marthenia Rolling, MSN, RN,BSN [place] Acute Care Coordinator [phone] (Direct dial)

## 2022-05-16 ENCOUNTER — Encounter: Payer: Self-pay | Admitting: Emergency Medicine

## 2022-05-16 ENCOUNTER — Ambulatory Visit (INDEPENDENT_AMBULATORY_CARE_PROVIDER_SITE_OTHER): Payer: Medicare Other | Admitting: Emergency Medicine

## 2022-05-16 VITALS — BP 120/72 | HR 115 | Temp 98.4°F

## 2022-05-16 DIAGNOSIS — J9611 Chronic respiratory failure with hypoxia: Secondary | ICD-10-CM

## 2022-05-16 DIAGNOSIS — J449 Chronic obstructive pulmonary disease, unspecified: Secondary | ICD-10-CM | POA: Diagnosis not present

## 2022-05-16 NOTE — Assessment & Plan Note (Signed)
Continue your slow prednisone taper as ordered.  Final prednisone dose will be 10 mg daily.  Please call us if you notice that your breathing is worsening as the prednisone is decreased. Continue Brovana nebulizer twice a day Continue Yupelri nebulizer once a day Continue DuoNeb nebulizers 3 times a day on a schedule You can use albuterol either 1 nebulizer treatment or 2 puffs up to every 4 hours if needed for shortness of breath Continue azithromycin 3 times a week Follow with APP or RB in 2 months to check your status on the lower dose of prednisone.  Call sooner if you have problems

## 2022-05-16 NOTE — Progress Notes (Signed)
Subjective:    Patient ID: MYKAL KIRCHMAN, female    DOB: Apr 09, 1951, 71 y.o.   MRN: [id number]  COPD She complains of shortness of breath. There is no cough or wheezing. Pertinent negatives include no ear pain, fever, headaches, postnasal drip, rhinorrhea, sneezing, sore throat or trouble swallowing. Her past medical history is significant for COPD.   [name] 12/04/21 --this a follow-up visit for 71 year old woman with a history of severe COPD and associated exertional hypoxemia.  She had been using 2 L/min, has been noted to have some desaturations with exertion and needed increased to 3 L/min.  She desaturated with ambulation here today and was uptitrated to 3 L/min.  Currently managed on Stiolto, albuterol which she uses about 4 x a day. She has SOB with exertion, especially a few stairs. Occasional cough. No flares or pred, abx since last time.  She is on Dexilant.  At her last visit we discussed starting loratadine, fluticasone nasal spray as needed She underwent a left breast lumpectomy that showed DCIS on 07/03/2021. She had sgy and is on arimidex.   Hospital follow-up visit 05/16/2022 --Ms. Fickett is 70 years old and has a history of severe COPD with associated chronic hypoxic respiratory failure.  She was discharged on a prednisone taper with a plan to slowly wean to a goal of 10 mg daily.  She is currently on 30 mg daily.  Also started on nocturnal BiPAP for both her COPD and her tracheobronchomalacia.  Continued on azithromycin 3 times a week, Singulair, loratadine.  Her oxygen is at 2.5 to 3 L/min depending on level of exertion.  Current bronchodilator regimen includes Yupelri, DuoNeb 3 times a day on a schedule, Brovana.  Discharged to [facility]. She is coughing some but difficulty clearing mucous. She has had difficulty doing rehab or any activity without help, has desaturations even on 3-4L/min. She is depending on assistance for ADL's. She is having trouble wearing the BiPAP, tolerating the  mask - make her dried out.     Review of Systems  Constitutional:  Negative for fever and unexpected weight change.  HENT:  Negative for congestion, dental problem, ear pain, nosebleeds, postnasal drip, rhinorrhea, sinus pressure, sneezing, sore throat and trouble swallowing.   Eyes:  Negative for redness and itching.  Respiratory:  Positive for shortness of breath. Negative for cough, chest tightness and wheezing.   Cardiovascular:  Negative for palpitations and leg swelling.  Gastrointestinal:  Negative for nausea and vomiting.  Genitourinary:  Negative for dysuria.  Musculoskeletal:  Negative for joint swelling.  Skin:  Negative for rash.  Neurological:  Negative for headaches.  Hematological:  Does not bruise/bleed easily.  Psychiatric/Behavioral:  Negative for dysphoric mood. The patient is not nervous/anxious.        Objective:   Physical Exam  Vitals:   05/16/22 1407  BP: 120/72  Pulse: (!) 115  Temp: 98.4 F (36.9 C)  TempSrc: Oral  SpO2: 93%   Gen: Pleasant, well-nourished, in no distress  ENT: No lesions,  Edentulous, mouth clear,  oropharynx clear, no postnasal drip  Neck: No JVD, no stridor  Lungs: No use of accessory muscles, distant,   Cardiovascular: RRR, heart sounds normal, no murmur or gallops, no peripheral edema  Musculoskeletal: No deformities, no cyanosis or clubbing  Neuro: alert, non focal, oriented   Skin: Warm, no lesions or rashes       Assessment & Plan:  COPD (chronic obstructive pulmonary disease) (HCC) Continue your slow prednisone taper as  ordered.  Final prednisone dose will be 10 mg daily.  Please call us if you notice that your breathing is worsening as the prednisone is decreased. Continue Brovana nebulizer twice a day Continue Yupelri nebulizer once a day Continue DuoNeb nebulizers 3 times a day on a schedule You can use albuterol either 1 nebulizer treatment or 2 puffs up to every 4 hours if needed for shortness of  breath Continue azithromycin 3 times a week Follow with APP or RB in 2 months to check your status on the lower dose of prednisone.  Call sooner if you have problems  Chronic respiratory failure (HCC) Try to wear your BiPAP reliably every night while sleeping. Continue your oxygen at 3-4 L/min   Baltazar Apo, MD, PhD 05/16/2022, 2:38 PM [facility] [phone] or if no answer [phone]

## 2022-05-16 NOTE — Patient Instructions (Signed)
Continue your slow prednisone taper as ordered.  Final prednisone dose will be 10 mg daily.  Please call us if you notice that your breathing is worsening as the prednisone is decreased. Try to wear your BiPAP reliably every night while sleeping. Continue your oxygen at 3-4 L/min Continue Brovana nebulizer twice a day Continue Yupelri nebulizer once a day Continue DuoNeb nebulizers 3 times a day on a schedule You can use albuterol either 1 nebulizer treatment or 2 puffs up to every 4 hours if needed for shortness of breath Continue azithromycin 3 times a week Follow with APP or RB in 2 months to check your status on the lower dose of prednisone.  Call sooner if you have problems

## 2022-05-16 NOTE — Assessment & Plan Note (Signed)
Try to wear your BiPAP reliably every night while sleeping. Continue your oxygen at 3-4 L/min

## 2022-05-19 ENCOUNTER — Inpatient Hospital Stay (HOSPITAL_COMMUNITY)
Admission: EM | Admit: 2022-05-19 | Discharge: 2022-07-04 | DRG: 004 | Disposition: A | Discharge status: Other Acute Inpatient Hospital | Payer: 59 | Source: Skilled Nursing Facility | Attending: Internal Medicine | Admitting: Internal Medicine

## 2022-05-19 ENCOUNTER — Encounter (HOSPITAL_COMMUNITY): Payer: Self-pay

## 2022-05-19 ENCOUNTER — Emergency Department (HOSPITAL_COMMUNITY): Payer: 59

## 2022-05-19 DIAGNOSIS — F32A Depression, unspecified: Secondary | ICD-10-CM | POA: Diagnosis not present

## 2022-05-19 DIAGNOSIS — J121 Respiratory syncytial virus pneumonia: Secondary | ICD-10-CM | POA: Diagnosis present

## 2022-05-19 DIAGNOSIS — D638 Anemia in other chronic diseases classified elsewhere: Secondary | ICD-10-CM | POA: Diagnosis not present

## 2022-05-19 DIAGNOSIS — E871 Hypo-osmolality and hyponatremia: Secondary | ICD-10-CM | POA: Diagnosis present

## 2022-05-19 DIAGNOSIS — E440 Moderate protein-calorie malnutrition: Secondary | ICD-10-CM | POA: Diagnosis not present

## 2022-05-19 DIAGNOSIS — I10 Essential (primary) hypertension: Secondary | ICD-10-CM | POA: Diagnosis present

## 2022-05-19 DIAGNOSIS — E559 Vitamin D deficiency, unspecified: Secondary | ICD-10-CM | POA: Diagnosis present

## 2022-05-19 DIAGNOSIS — R1310 Dysphagia, unspecified: Secondary | ICD-10-CM | POA: Diagnosis not present

## 2022-05-19 DIAGNOSIS — R000 Tachycardia, unspecified: Secondary | ICD-10-CM | POA: Diagnosis not present

## 2022-05-19 DIAGNOSIS — H5702 Anisocoria: Secondary | ICD-10-CM | POA: Diagnosis not present

## 2022-05-19 DIAGNOSIS — E876 Hypokalemia: Secondary | ICD-10-CM | POA: Diagnosis present

## 2022-05-19 DIAGNOSIS — F419 Anxiety disorder, unspecified: Secondary | ICD-10-CM | POA: Diagnosis not present

## 2022-05-19 DIAGNOSIS — D0512 Intraductal carcinoma in situ of left breast: Secondary | ICD-10-CM | POA: Diagnosis present

## 2022-05-19 DIAGNOSIS — G4733 Obstructive sleep apnea (adult) (pediatric): Secondary | ICD-10-CM | POA: Diagnosis present

## 2022-05-19 DIAGNOSIS — I63542 Cerebral infarction due to unspecified occlusion or stenosis of left cerebellar artery: Secondary | ICD-10-CM | POA: Diagnosis not present

## 2022-05-19 DIAGNOSIS — Z86010 Personal history of colonic polyps: Secondary | ICD-10-CM

## 2022-05-19 DIAGNOSIS — R600 Localized edema: Secondary | ICD-10-CM | POA: Diagnosis present

## 2022-05-19 DIAGNOSIS — Z9981 Dependence on supplemental oxygen: Secondary | ICD-10-CM | POA: Diagnosis not present

## 2022-05-19 DIAGNOSIS — R0600 Dyspnea, unspecified: Secondary | ICD-10-CM | POA: Diagnosis not present

## 2022-05-19 DIAGNOSIS — R29701 NIHSS score 1: Secondary | ICD-10-CM | POA: Diagnosis not present

## 2022-05-19 DIAGNOSIS — R0602 Shortness of breath: Secondary | ICD-10-CM | POA: Diagnosis present

## 2022-05-19 DIAGNOSIS — J159 Unspecified bacterial pneumonia: Secondary | ICD-10-CM | POA: Diagnosis present

## 2022-05-19 DIAGNOSIS — T502X5A Adverse effect of carbonic-anhydrase inhibitors, benzothiadiazides and other diuretics, initial encounter: Secondary | ICD-10-CM | POA: Diagnosis not present

## 2022-05-19 DIAGNOSIS — Z7952 Long term (current) use of systemic steroids: Secondary | ICD-10-CM

## 2022-05-19 DIAGNOSIS — J9602 Acute respiratory failure with hypercapnia: Principal | ICD-10-CM

## 2022-05-19 DIAGNOSIS — Z1152 Encounter for screening for COVID-19: Secondary | ICD-10-CM | POA: Diagnosis not present

## 2022-05-19 DIAGNOSIS — J440 Chronic obstructive pulmonary disease with acute lower respiratory infection: Secondary | ICD-10-CM | POA: Diagnosis present

## 2022-05-19 DIAGNOSIS — J439 Emphysema, unspecified: Secondary | ICD-10-CM | POA: Diagnosis not present

## 2022-05-19 DIAGNOSIS — I639 Cerebral infarction, unspecified: Secondary | ICD-10-CM | POA: Diagnosis not present

## 2022-05-19 DIAGNOSIS — Z6823 Body mass index (BMI) 23.0-23.9, adult: Secondary | ICD-10-CM

## 2022-05-19 DIAGNOSIS — R609 Edema, unspecified: Secondary | ICD-10-CM | POA: Diagnosis not present

## 2022-05-19 DIAGNOSIS — Z833 Family history of diabetes mellitus: Secondary | ICD-10-CM

## 2022-05-19 DIAGNOSIS — J9620 Acute and chronic respiratory failure, unspecified whether with hypoxia or hypercapnia: Secondary | ICD-10-CM | POA: Diagnosis not present

## 2022-05-19 DIAGNOSIS — J309 Allergic rhinitis, unspecified: Secondary | ICD-10-CM | POA: Diagnosis present

## 2022-05-19 DIAGNOSIS — J9621 Acute and chronic respiratory failure with hypoxia: Secondary | ICD-10-CM | POA: Diagnosis not present

## 2022-05-19 DIAGNOSIS — R0603 Acute respiratory distress: Secondary | ICD-10-CM | POA: Diagnosis not present

## 2022-05-19 DIAGNOSIS — Z882 Allergy status to sulfonamides status: Secondary | ICD-10-CM

## 2022-05-19 DIAGNOSIS — Z8249 Family history of ischemic heart disease and other diseases of the circulatory system: Secondary | ICD-10-CM

## 2022-05-19 DIAGNOSIS — E1165 Type 2 diabetes mellitus with hyperglycemia: Secondary | ICD-10-CM | POA: Diagnosis not present

## 2022-05-19 DIAGNOSIS — R0902 Hypoxemia: Secondary | ICD-10-CM | POA: Diagnosis not present

## 2022-05-19 DIAGNOSIS — E781 Pure hyperglyceridemia: Secondary | ICD-10-CM | POA: Diagnosis present

## 2022-05-19 DIAGNOSIS — E870 Hyperosmolality and hypernatremia: Secondary | ICD-10-CM | POA: Diagnosis not present

## 2022-05-19 DIAGNOSIS — Z823 Family history of stroke: Secondary | ICD-10-CM

## 2022-05-19 DIAGNOSIS — R197 Diarrhea, unspecified: Secondary | ICD-10-CM | POA: Diagnosis not present

## 2022-05-19 DIAGNOSIS — F259 Schizoaffective disorder, unspecified: Secondary | ICD-10-CM | POA: Diagnosis present

## 2022-05-19 DIAGNOSIS — Z9911 Dependence on respirator [ventilator] status: Secondary | ICD-10-CM

## 2022-05-19 DIAGNOSIS — Z930 Tracheostomy status: Secondary | ICD-10-CM

## 2022-05-19 DIAGNOSIS — J90 Pleural effusion, not elsewhere classified: Secondary | ICD-10-CM | POA: Diagnosis not present

## 2022-05-19 DIAGNOSIS — L89159 Pressure ulcer of sacral region, unspecified stage: Secondary | ICD-10-CM | POA: Diagnosis not present

## 2022-05-19 DIAGNOSIS — R0682 Tachypnea, not elsewhere classified: Secondary | ICD-10-CM | POA: Diagnosis not present

## 2022-05-19 DIAGNOSIS — Z803 Family history of malignant neoplasm of breast: Secondary | ICD-10-CM

## 2022-05-19 DIAGNOSIS — Z79899 Other long term (current) drug therapy: Secondary | ICD-10-CM

## 2022-05-19 DIAGNOSIS — J9622 Acute and chronic respiratory failure with hypercapnia: Secondary | ICD-10-CM | POA: Diagnosis not present

## 2022-05-19 DIAGNOSIS — J189 Pneumonia, unspecified organism: Secondary | ICD-10-CM | POA: Diagnosis not present

## 2022-05-19 DIAGNOSIS — R5381 Other malaise: Secondary | ICD-10-CM | POA: Diagnosis not present

## 2022-05-19 DIAGNOSIS — Z9049 Acquired absence of other specified parts of digestive tract: Secondary | ICD-10-CM | POA: Diagnosis not present

## 2022-05-19 DIAGNOSIS — L8990 Pressure ulcer of unspecified site, unspecified stage: Secondary | ICD-10-CM | POA: Insufficient documentation

## 2022-05-19 DIAGNOSIS — I63342 Cerebral infarction due to thrombosis of left cerebellar artery: Secondary | ICD-10-CM | POA: Diagnosis not present

## 2022-05-19 DIAGNOSIS — I272 Pulmonary hypertension, unspecified: Secondary | ICD-10-CM | POA: Diagnosis not present

## 2022-05-19 DIAGNOSIS — Z87891 Personal history of nicotine dependence: Secondary | ICD-10-CM

## 2022-05-19 DIAGNOSIS — R339 Retention of urine, unspecified: Secondary | ICD-10-CM | POA: Diagnosis not present

## 2022-05-19 DIAGNOSIS — Z79811 Long term (current) use of aromatase inhibitors: Secondary | ICD-10-CM

## 2022-05-19 DIAGNOSIS — K760 Fatty (change of) liver, not elsewhere classified: Secondary | ICD-10-CM | POA: Diagnosis not present

## 2022-05-19 DIAGNOSIS — J9811 Atelectasis: Secondary | ICD-10-CM | POA: Diagnosis not present

## 2022-05-19 DIAGNOSIS — R0989 Other specified symptoms and signs involving the circulatory and respiratory systems: Secondary | ICD-10-CM | POA: Diagnosis not present

## 2022-05-19 DIAGNOSIS — I672 Cerebral atherosclerosis: Secondary | ICD-10-CM | POA: Diagnosis not present

## 2022-05-19 DIAGNOSIS — Z818 Family history of other mental and behavioral disorders: Secondary | ICD-10-CM

## 2022-05-19 DIAGNOSIS — I6381 Other cerebral infarction due to occlusion or stenosis of small artery: Secondary | ICD-10-CM | POA: Diagnosis not present

## 2022-05-19 DIAGNOSIS — Z515 Encounter for palliative care: Secondary | ICD-10-CM | POA: Diagnosis not present

## 2022-05-19 DIAGNOSIS — K219 Gastro-esophageal reflux disease without esophagitis: Secondary | ICD-10-CM | POA: Diagnosis present

## 2022-05-19 DIAGNOSIS — J9601 Acute respiratory failure with hypoxia: Secondary | ICD-10-CM | POA: Diagnosis not present

## 2022-05-19 DIAGNOSIS — J9690 Respiratory failure, unspecified, unspecified whether with hypoxia or hypercapnia: Secondary | ICD-10-CM | POA: Diagnosis not present

## 2022-05-19 DIAGNOSIS — I6523 Occlusion and stenosis of bilateral carotid arteries: Secondary | ICD-10-CM | POA: Diagnosis not present

## 2022-05-19 DIAGNOSIS — Z4682 Encounter for fitting and adjustment of non-vascular catheter: Secondary | ICD-10-CM | POA: Diagnosis not present

## 2022-05-19 DIAGNOSIS — J441 Chronic obstructive pulmonary disease with (acute) exacerbation: Secondary | ICD-10-CM | POA: Diagnosis not present

## 2022-05-19 DIAGNOSIS — E538 Deficiency of other specified B group vitamins: Secondary | ICD-10-CM | POA: Diagnosis present

## 2022-05-19 DIAGNOSIS — M810 Age-related osteoporosis without current pathological fracture: Secondary | ICD-10-CM | POA: Diagnosis present

## 2022-05-19 DIAGNOSIS — Z8673 Personal history of transient ischemic attack (TIA), and cerebral infarction without residual deficits: Secondary | ICD-10-CM

## 2022-05-19 DIAGNOSIS — R053 Chronic cough: Secondary | ICD-10-CM | POA: Diagnosis not present

## 2022-05-19 DIAGNOSIS — F411 Generalized anxiety disorder: Secondary | ICD-10-CM | POA: Diagnosis not present

## 2022-05-19 DIAGNOSIS — J449 Chronic obstructive pulmonary disease, unspecified: Secondary | ICD-10-CM | POA: Diagnosis not present

## 2022-05-19 DIAGNOSIS — R9431 Abnormal electrocardiogram [ECG] [EKG]: Secondary | ICD-10-CM | POA: Diagnosis not present

## 2022-05-19 DIAGNOSIS — Z853 Personal history of malignant neoplasm of breast: Secondary | ICD-10-CM

## 2022-05-19 DIAGNOSIS — Z90710 Acquired absence of both cervix and uterus: Secondary | ICD-10-CM

## 2022-05-19 LAB — I-STAT VENOUS BLOOD GAS, ED
Acid-Base Excess: 9 mmol/L — ABNORMAL HIGH (ref 0.0–2.0)
Acid-Base Excess: 12 mmol/L — ABNORMAL HIGH (ref 0.0–2.0)
Bicarbonate: 39.6 mmol/L — ABNORMAL HIGH (ref 20.0–28.0)
Bicarbonate: 37.4 mmol/L — ABNORMAL HIGH (ref 20.0–28.0)
Calcium, Ion: 1.14 mmol/L — ABNORMAL LOW (ref 1.15–1.40)
Calcium, Ion: 0.89 mmol/L — CL (ref 1.15–1.40)
HCT: 44 % (ref 36.0–46.0)
HCT: 42 % (ref 36.0–46.0)
Hemoglobin: 15 g/dL (ref 12.0–15.0)
Hemoglobin: 14.3 g/dL (ref 12.0–15.0)
O2 Saturation: 99 %
O2 Saturation: 99 %
Potassium: 3.1 mmol/L — ABNORMAL LOW (ref 3.5–5.1)
Potassium: 5.3 mmol/L — ABNORMAL HIGH (ref 3.5–5.1)
Sodium: 125 mmol/L — ABNORMAL LOW (ref 135–145)
Sodium: 122 mmol/L — ABNORMAL LOW (ref 135–145)
TCO2: 42 mmol/L — ABNORMAL HIGH (ref 22–32)
TCO2: 39 mmol/L — ABNORMAL HIGH (ref 22–32)
pCO2, Ven: 80.7 mmHg (ref 44–60)
pCO2, Ven: 48.7 mmHg (ref 44–60)
pH, Ven: 7.299 (ref 7.25–7.43)
pH, Ven: 7.493 — ABNORMAL HIGH (ref 7.25–7.43)
pO2, Ven: 137 mmHg — ABNORMAL HIGH (ref 32–45)
pO2, Ven: 144 mmHg — ABNORMAL HIGH (ref 32–45)

## 2022-05-19 LAB — CBC
HCT: 42.6 % (ref 36.0–46.0)
Hemoglobin: 13.9 g/dL (ref 12.0–15.0)
MCH: 31.4 pg (ref 26.0–34.0)
MCHC: 32.6 g/dL (ref 30.0–36.0)
MCV: 96.2 fL (ref 80.0–100.0)
Platelets: 271 10*3/uL (ref 150–400)
RBC: 4.43 MIL/uL (ref 3.87–5.11)
RDW: 13.1 % (ref 11.5–15.5)
WBC: 20.9 10*3/uL — ABNORMAL HIGH (ref 4.0–10.5)
nRBC: 0 % (ref 0.0–0.2)

## 2022-05-19 LAB — COMPREHENSIVE METABOLIC PANEL
ALT: 42 U/L (ref 0–44)
AST: 27 U/L (ref 15–41)
Albumin: 3.9 g/dL (ref 3.5–5.0)
Alkaline Phosphatase: 66 U/L (ref 38–126)
Anion gap: 16 — ABNORMAL HIGH (ref 5–15)
BUN: 10 mg/dL (ref 8–23)
CO2: 28 mmol/L (ref 22–32)
Calcium: 8.7 mg/dL — ABNORMAL LOW (ref 8.9–10.3)
Chloride: 85 mmol/L — ABNORMAL LOW (ref 98–111)
Creatinine, Ser: 0.71 mg/dL (ref 0.44–1.00)
GFR, Estimated: >60 mL/min (ref >60)
Glucose, Bld: 223 mg/dL — ABNORMAL HIGH (ref 70–99)
Potassium: 3.6 mmol/L (ref 3.5–5.1)
Sodium: 129 mmol/L — ABNORMAL LOW (ref 135–145)
Total Bilirubin: 2.2 mg/dL — ABNORMAL HIGH (ref 0.3–1.2)
Total Protein: 5.9 g/dL — ABNORMAL LOW (ref 6.5–8.1)

## 2022-05-19 LAB — TROPONIN I (HIGH SENSITIVITY): Troponin I (High Sensitivity): 11 ng/L (ref <18)

## 2022-05-19 LAB — I-STAT CHEM 8, ED
BUN: 11 mg/dL (ref 8–23)
Calcium, Ion: 1.1 mmol/L — ABNORMAL LOW (ref 1.15–1.40)
Chloride: 83 mmol/L — ABNORMAL LOW (ref 98–111)
Creatinine, Ser: 0.7 mg/dL (ref 0.44–1.00)
Glucose, Bld: 226 mg/dL — ABNORMAL HIGH (ref 70–99)
HCT: 45 % (ref 36.0–46.0)
Hemoglobin: 15.3 g/dL — ABNORMAL HIGH (ref 12.0–15.0)
Potassium: 3.5 mmol/L (ref 3.5–5.1)
Sodium: 125 mmol/L — ABNORMAL LOW (ref 135–145)
TCO2: 33 mmol/L — ABNORMAL HIGH (ref 22–32)

## 2022-05-19 LAB — RESP PANEL BY RT-PCR (RSV, FLU A&B, COVID)  RVPGX2
Influenza A by PCR: NEGATIVE
Influenza B by PCR: NEGATIVE
Resp Syncytial Virus by PCR: POSITIVE — AB
SARS Coronavirus 2 by RT PCR: NEGATIVE

## 2022-05-19 LAB — BRAIN NATRIURETIC PEPTIDE: B Natriuretic Peptide: 50.6 pg/mL (ref 0.0–100.0)

## 2022-05-19 MED ORDER — LORAZEPAM 1 MG PO TABS
1.0000 mg | ORAL_TABLET | Freq: Two times a day (BID) | ORAL | Status: DC | PRN
  Administered 2022-05-20: 1 mg via ORAL
  Filled 2022-05-19: qty 1

## 2022-05-19 MED ORDER — IPRATROPIUM BROMIDE 0.02 % IN SOLN
0.5000 mg | Freq: Once | RESPIRATORY_TRACT | Status: AC
  Administered 2022-05-19: 0.5 mg via RESPIRATORY_TRACT

## 2022-05-19 MED ORDER — PREDNISONE 20 MG PO TABS: 40.0000 mg | ORAL_TABLET | Freq: Every day | ORAL | Status: DC

## 2022-05-19 MED ORDER — ENOXAPARIN SODIUM 40 MG/0.4ML IJ SOSY
40.0000 mg | PREFILLED_SYRINGE | INTRAMUSCULAR | Status: DC
  Administered 2022-05-19 – 2022-05-28 (×10): 40 mg via SUBCUTANEOUS
  Filled 2022-05-19 (×10): qty 0.4

## 2022-05-19 MED ORDER — AMLODIPINE BESYLATE 5 MG PO TABS
2.5000 mg | ORAL_TABLET | Freq: Every morning | ORAL | Status: DC
  Administered 2022-05-20 – 2022-05-26 (×5): 2.5 mg via ORAL
  Filled 2022-05-19 (×6): qty 1

## 2022-05-19 MED ORDER — ACETAMINOPHEN 325 MG PO TABS
650.0000 mg | ORAL_TABLET | Freq: Four times a day (QID) | ORAL | Status: DC | PRN
  Administered 2022-06-06 (×2): 650 mg via ORAL
  Filled 2022-05-19 (×3): qty 2

## 2022-05-19 MED ORDER — HYDRALAZINE HCL 20 MG/ML IJ SOLN
5.0000 mg | INTRAMUSCULAR | Status: DC | PRN
  Administered 2022-05-29 – 2022-05-31 (×2): 5 mg via INTRAVENOUS
  Filled 2022-05-19 (×2): qty 1

## 2022-05-19 MED ORDER — REVEFENACIN 175 MCG/3ML IN SOLN
175.0000 ug | Freq: Every morning | RESPIRATORY_TRACT | Status: DC
  Administered 2022-05-20 – 2022-06-03 (×15): 175 ug via RESPIRATORY_TRACT
  Filled 2022-05-19 (×16): qty 3

## 2022-05-19 MED ORDER — LORATADINE 10 MG PO TABS
10.0000 mg | ORAL_TABLET | Freq: Every morning | ORAL | Status: DC
  Administered 2022-05-20 – 2022-05-26 (×4): 10 mg via ORAL
  Filled 2022-05-19 (×8): qty 1

## 2022-05-19 MED ORDER — METHYLPREDNISOLONE SODIUM SUCC 125 MG IJ SOLR
80.0000 mg | Freq: Two times a day (BID) | INTRAMUSCULAR | Status: AC
  Administered 2022-05-19: 80 mg via INTRAVENOUS
  Filled 2022-05-19: qty 2

## 2022-05-19 MED ORDER — ALBUTEROL (5 MG/ML) CONTINUOUS INHALATION SOLN
10.0000 mg/h | INHALATION_SOLUTION | Freq: Once | RESPIRATORY_TRACT | Status: DC
  Filled 2022-05-19: qty 20

## 2022-05-19 MED ORDER — ACETAMINOPHEN 650 MG RE SUPP: 650.0000 mg | Freq: Four times a day (QID) | RECTAL | Status: DC | PRN

## 2022-05-19 MED ORDER — ONDANSETRON HCL 4 MG PO TABS: 4.0000 mg | ORAL_TABLET | Freq: Four times a day (QID) | ORAL | Status: DC | PRN

## 2022-05-19 MED ORDER — MAGNESIUM SULFATE 2 GM/50ML IV SOLN
2.0000 g | Freq: Once | INTRAVENOUS | Status: AC
  Administered 2022-05-19: 2 g via INTRAVENOUS
  Filled 2022-05-19: qty 50

## 2022-05-19 MED ORDER — SODIUM CHLORIDE 0.9% FLUSH
3.0000 mL | Freq: Two times a day (BID) | INTRAVENOUS | Status: DC
  Administered 2022-05-19 – 2022-07-04 (×88): 3 mL via INTRAVENOUS

## 2022-05-19 MED ORDER — ALBUTEROL SULFATE (2.5 MG/3ML) 0.083% IN NEBU
2.5000 mg | INHALATION_SOLUTION | RESPIRATORY_TRACT | Status: DC | PRN
  Filled 2022-05-19: qty 3

## 2022-05-19 MED ORDER — LACTATED RINGERS IV SOLN: INTRAVENOUS | Status: AC

## 2022-05-19 MED ORDER — OLANZAPINE 5 MG PO TABS
7.5000 mg | ORAL_TABLET | Freq: Every morning | ORAL | Status: DC
  Administered 2022-05-20 – 2022-05-26 (×5): 7.5 mg via ORAL
  Filled 2022-05-19 (×9): qty 1

## 2022-05-19 MED ORDER — ONDANSETRON HCL 4 MG/2ML IJ SOLN: 4.0000 mg | Freq: Four times a day (QID) | INTRAMUSCULAR | Status: DC | PRN

## 2022-05-19 MED ORDER — DOCUSATE SODIUM 100 MG PO CAPS
100.0000 mg | ORAL_CAPSULE | Freq: Two times a day (BID) | ORAL | Status: DC
  Administered 2022-05-20 – 2022-05-26 (×7): 100 mg via ORAL
  Filled 2022-05-19 (×7): qty 1

## 2022-05-19 MED ORDER — METHYLPREDNISOLONE SODIUM SUCC 125 MG IJ SOLR: 60.0000 mg | Freq: Two times a day (BID) | INTRAMUSCULAR | Status: DC

## 2022-05-19 MED ORDER — ALBUTEROL SULFATE (2.5 MG/3ML) 0.083% IN NEBU
10.0000 mg | INHALATION_SOLUTION | Freq: Once | RESPIRATORY_TRACT | Status: AC
  Administered 2022-05-19: 10 mg via RESPIRATORY_TRACT
  Filled 2022-05-19: qty 12

## 2022-05-19 MED ORDER — IPRATROPIUM-ALBUTEROL 0.5-2.5 (3) MG/3ML IN SOLN
3.0000 mL | Freq: Once | RESPIRATORY_TRACT | Status: AC
  Administered 2022-05-19: 3 mL via RESPIRATORY_TRACT
  Filled 2022-05-19: qty 3

## 2022-05-19 MED ORDER — IPRATROPIUM-ALBUTEROL 0.5-2.5 (3) MG/3ML IN SOLN
3.0000 mL | Freq: Four times a day (QID) | RESPIRATORY_TRACT | Status: DC
  Administered 2022-05-19 – 2022-05-20 (×3): 3 mL via RESPIRATORY_TRACT
  Filled 2022-05-19 (×3): qty 3

## 2022-05-19 MED ORDER — SODIUM CHLORIDE 0.9 % IV SOLN
2.0000 g | Freq: Three times a day (TID) | INTRAVENOUS | Status: DC
  Administered 2022-05-19 – 2022-05-20 (×3): 2 g via INTRAVENOUS
  Filled 2022-05-19 (×3): qty 12.5

## 2022-05-19 MED ORDER — METHYLPREDNISOLONE SODIUM SUCC 125 MG IJ SOLR
125.0000 mg | INTRAMUSCULAR | Status: AC
  Administered 2022-05-19: 125 mg via INTRAVENOUS
  Filled 2022-05-19: qty 2

## 2022-05-19 MED ORDER — POLYETHYLENE GLYCOL 3350 17 G PO PACK: 17.0000 g | PACK | Freq: Every day | ORAL | Status: DC | PRN

## 2022-05-19 MED ORDER — ANASTROZOLE 1 MG PO TABS
1.0000 mg | ORAL_TABLET | Freq: Every morning | ORAL | Status: DC
  Administered 2022-05-20 – 2022-05-26 (×5): 1 mg via ORAL
  Filled 2022-05-19 (×9): qty 1

## 2022-05-19 MED ORDER — PANTOPRAZOLE SODIUM 40 MG PO TBEC
40.0000 mg | DELAYED_RELEASE_TABLET | Freq: Every day | ORAL | Status: DC
  Administered 2022-05-20: 40 mg via ORAL
  Filled 2022-05-19: qty 1

## 2022-05-19 MED ORDER — GUAIFENESIN ER 600 MG PO TB12: 600.0000 mg | ORAL_TABLET | Freq: Two times a day (BID) | ORAL | Status: DC | PRN

## 2022-05-19 MED ORDER — ARFORMOTEROL TARTRATE 15 MCG/2ML IN NEBU
15.0000 ug | INHALATION_SOLUTION | Freq: Two times a day (BID) | RESPIRATORY_TRACT | Status: DC
  Administered 2022-05-19 – 2022-07-04 (×92): 15 ug via RESPIRATORY_TRACT
  Filled 2022-05-19 (×91): qty 2

## 2022-05-19 MED ORDER — PREDNISONE 20 MG PO TABS
40.0000 mg | ORAL_TABLET | Freq: Every day | ORAL | Status: DC
  Administered 2022-05-20: 40 mg via ORAL
  Filled 2022-05-19: qty 2

## 2022-05-19 MED ORDER — BISACODYL 5 MG PO TBEC: 5.0000 mg | DELAYED_RELEASE_TABLET | Freq: Every day | ORAL | Status: DC | PRN

## 2022-05-19 NOTE — ED Provider Notes (Signed)
[facility] EMERGENCY DEPARTMENT Provider Note   CSN: [id number] Arrival date & time: 05/19/22  4098     History  Chief Complaint  Patient presents with   Respiratory Distress    BIB EMS from [facility] for resp distress, pt is on CPAP with EMS, on 4L satting 65%, tripodding, no air movement in lungs, A&O x4 at baseline, presenting with extreme fatigue, hx of COPD and asthma    CLEVELAND PAIZ is a 71 y.o. female.  HPI  Patient is 71 year old female with gold stage IV COPD, she is on 2 to 3 L of oxygen nasal cannula continuously, she is history of pulm hypertension breast cancer status postlumpectomy, anxiety, depression. She is presenting today with shortness of breath. History is did as she is on BiPAP currently. Per EMS report she was brought from facility where call out was for worsening shortness of breath. She shakes her head no to any chest pain.       Home Medications Prior to Admission medications   Medication Sig Start Date End Date Taking? Authorizing Provider  albuterol (PROVENTIL) (2.5 MG/3ML) 0.083% nebulizer solution Take 3 mLs (2.5 mg total) by nebulization every 2 (two) hours as needed for wheezing or shortness of breath. 05/12/22   Hongalgi, Lenis Dickinson, MD  albuterol (VENTOLIN HFA) 108 (90 Base) MCG/ACT inhaler INHALE TWO PUFFS BY MOUTH INTO THE LUNGS EVERY 6 HOURS AS NEEDED FOR WHEEZING OR SHORTNESS OF BREATH Patient taking differently: Inhale 2 puffs into the lungs every 6 (six) hours as needed for wheezing or shortness of breath. 11/29/21   Collene Gobble, MD  amLODipine (NORVASC) 2.5 MG tablet Take 1 tablet (2.5 mg total) by mouth daily. 04/25/22 05/25/22  Antonieta Pert, MD  anastrozole (ARIMIDEX) 1 MG tablet TAKE ONE TABLET BY MOUTH DAILY 02/14/22   Benay Pike, MD  arformoterol (BROVANA) 15 MCG/2ML NEBU Take 2 mLs (15 mcg total) by nebulization 2 (two) times daily. 05/06/22   Samuella Cota, MD  azithromycin (ZITHROMAX) 500 MG tablet Take 1  tablet (500 mg total) by mouth 3 (three) times a week. 05/07/22   Samuella Cota, MD  Calcium Carbonate-Vitamin D 600-400 MG-UNIT tablet Take 1 tablet by mouth 2 (two) times daily. 02/22/21   [name], NP  cholecalciferol (VITAMIN D) 1000 UNITS tablet Take 3,000 Units by mouth daily.     [provider]  cyanocobalamin (VITAMIN B12) 1000 MCG tablet TAKE ONE TABLET BY MOUTH DAILY 04/28/22   [name], NP  dexlansoprazole (DEXILANT) 60 MG capsule TAKE ONE CAPSULE BY MOUTH DAILY 04/28/22   [name], NP  furosemide (LASIX) 20 MG tablet Take 1 tablet (20 mg total) by mouth daily. 05/05/22   Samuella Cota, MD  ipratropium-albuterol (DUONEB) 0.5-2.5 (3) MG/3ML SOLN Take 3 mLs by nebulization in the morning, at noon, in the evening, and at bedtime. 05/05/22   Samuella Cota, MD  loratadine (CLARITIN) 10 MG tablet Take 1 tablet (10 mg total) by mouth daily. 05/06/22   Samuella Cota, MD  montelukast (SINGULAIR) 10 MG tablet Take 1 tablet (10 mg total) by mouth at bedtime. 05/05/22   Samuella Cota, MD  OLANZapine (ZYPREXA) 5 MG tablet Take 7.5 mg by mouth daily.  06/18/16   [provider]  OXYGEN Inhale 3 L into the lungs.    [provider]  predniSONE (DELTASONE) 10 MG tablet Take 3 tabs (30 mg total) daily x 1 week, then 2 tabs (20 mg total)  daily x 1 week, then continue 1 tab (10 Mg total) daily until office visit with pulmonology. 05/12/22   Hongalgi, Lenis Dickinson, MD  revefenacin (YUPELRI) 175 MCG/3ML nebulizer solution Take 3 mLs (175 mcg total) by nebulization daily. 05/06/22   Samuella Cota, MD  Spacer/Aero-Holding Chambers (AEROCHAMBER MV) inhaler Use as instructed 03/13/17   Collene Gobble, MD  vitamin E 180 MG (400 UNITS) capsule Take 400 Units by mouth daily.    [provider]      Allergies    Sulfonamide derivatives    Review of Systems   Review of Systems  Physical Exam Updated Vital Signs BP (!)  161/98   Pulse (!) 127   Resp (!) 26   SpO2 95%  Physical Exam Vitals and nursing note reviewed.  Constitutional:      General: She is in acute distress.     Comments: Following commands, oriented to self and location and circumstances  HENT:     Head: Normocephalic and atraumatic.     Nose: Nose normal.     Mouth/Throat:     Mouth: Mucous membranes are dry.  Eyes:     General: No scleral icterus. Cardiovascular:     Rate and Rhythm: Normal rate and regular rhythm.     Pulses: Normal pulses.     Heart sounds: Normal heart sounds.  Pulmonary:     Effort: Pulmonary effort is normal. No respiratory distress.     Breath sounds: Wheezing present.  Abdominal:     Palpations: Abdomen is soft.     Tenderness: There is no abdominal tenderness. There is no guarding or rebound.  Musculoskeletal:     Cervical back: Normal range of motion.     Right lower leg: No edema.     Left lower leg: No edema.     Comments: Trace LEE symmetric to ankles  Skin:    General: Skin is warm and dry.     Capillary Refill: Capillary refill takes less than 2 seconds.  Neurological:     Mental Status: Mental status is at baseline.  Psychiatric:        Mood and Affect: Mood normal.        Behavior: Behavior normal.     ED Results / Procedures / Treatments   Labs (all labs ordered are listed, but only abnormal results are displayed) Labs Reviewed  CBC - Abnormal; Notable for the following components:      Result Value   WBC 20.9 (*)    All other components within normal limits  I-STAT CHEM 8, ED - Abnormal; Notable for the following components:   Sodium 125 (*)    Chloride 83 (*)    Glucose, Bld 226 (*)    Calcium, Ion 1.10 (*)    TCO2 33 (*)    Hemoglobin 15.3 (*)    All other components within normal limits  RESP PANEL BY RT-PCR (RSV, FLU A&B, COVID)  RVPGX2  COMPREHENSIVE METABOLIC PANEL  BRAIN NATRIURETIC PEPTIDE  I-STAT ARTERIAL BLOOD GAS, ED  TROPONIN I (HIGH SENSITIVITY)     EKG EKG Interpretation  Date/Time:  Monday May 19 2022 06:41:50 EST Ventricular Rate:  128 PR Interval:  143 QRS Duration: 89 QT Interval:  359 QTC Calculation: 524 R Axis:   80 Text Interpretation: Sinus tachycardia Atrial premature complex Anteroseptal infarct, old Nonspecific repol abnormality, inferior leads Prolonged QT interval Baseline wander in lead(s) V5 V6 When compared with ECG of 04/26/2022, HEART RATE has increased  QT has lengthened Confirmed by Delora Fuel ([id number]) on 05/19/2022 6:45:31 AM  Radiology No results found.  Procedures .Critical Care  Performed by: Tedd Sias, PA Authorized by: Tedd Sias, PA   Critical care provider statement:    Critical care time (minutes):  35   Critical care time was exclusive of:  Separately billable procedures and treating other patients and teaching time   Critical care was necessary to treat or prevent imminent or life-threatening deterioration of the following conditions:  Respiratory failure   Critical care was time spent personally by me on the following activities:  Development of treatment plan with patient or surrogate, review of old charts, re-evaluation of patient's condition, pulse oximetry, ordering and review of radiographic studies, ordering and review of laboratory studies, ordering and performing treatments and interventions, obtaining history from patient or surrogate, examination of patient and evaluation of patient's response to treatment   Care discussed with: admitting provider       Medications Ordered in ED Medications  magnesium sulfate IVPB 2 g 50 mL (2 g Intravenous New Bag/Given 05/19/22 0652)  albuterol (PROVENTIL,VENTOLIN) solution continuous neb (10 mg/hr Nebulization Not Given 05/19/22 0717)  methylPREDNISolone sodium succinate (SOLU-MEDROL) 125 mg/2 mL injection 125 mg (125 mg Intravenous Given 05/19/22 0652)  albuterol (PROVENTIL) (2.5 MG/3ML) 0.083% nebulizer solution 10 mg (10  mg Nebulization Given 05/19/22 0717)  ipratropium (ATROVENT) nebulizer solution 0.5 mg (0.5 mg Nebulization Given 05/19/22 0717)    ED Course/ Medical Decision Making/ A&P Clinical Course as of 05/19/22 0851  Mon May 19, 2022  0705 AMS COPD on BiPAP [CC]  0759 On reassessment patient is mentating better than previously she is continuing to follow commands but is now answering questions to the BiPAP mask.  Her work of breathing is improving and her tachycardia is improving as a result of this.  She is still on continuous nebulizer with some additional Atrovent.  She is still on BiPAP magnesium is almost completely infused. [WF]    Clinical Course User Index [CC] Tretha Sciara, MD [WF] Tedd Sias, [place]                           Medical Decision Making Amount and/or Complexity of Data Reviewed Labs: ordered. Radiology: ordered.  Risk Prescription drug management. Decision regarding hospitalization.   This patient presents to the ED for concern of SOB, this involves a number of treatment options, and is a complaint that carries with it a high risk of complications and morbidity. A differential diagnosis was considered for the patient's symptoms which is discussed below:   The causes for shortness of breath include but are not limited to Cardiac (AHF, pericardial effusion and tamponade, arrhythmias, ischemia, etc) Respiratory (COPD, asthma, pneumonia, pneumothorax, primary pulmonary hypertension, PE/VQ mismatch) Hematological (anemia) Neuromuscular (ALS, Guillain-Barr, etc)    Co morbidities: Discussed in HPI   Brief History:  Patient is 71 year old female with gold stage IV COPD, she is on 2 to 3 L of oxygen nasal cannula continuously, she is history of pulm hypertension breast cancer status postlumpectomy, anxiety, depression. She is presenting today with shortness of breath. History is did as she is on BiPAP currently. Per EMS report she was brought from facility where  call out was for worsening shortness of breath. She shakes her head no to any chest pain.     EMR reviewed including pt PMHx, past surgical history and past visits to ER.   See  HPI for more details   Lab Tests:   I ordered and independently interpreted labs. Labs notable for Severely hypercarbic at approximately 80 pCO2 initially repeat VBG is 48  CMP with mild hyponatremia likely some dehydration and also some pseudohyponatremia with elevated blood sugar.  RSV positive on respiratory panel.  CBC with leukocytosis of 20.9, troponin without significant elevation.  Imaging Studies:  NAD. I personally reviewed all imaging studies and no acute abnormality found. I agree with radiology interpretation. No acute findings on chest x-ray there is emphysematous changes.   Cardiac Monitoring:  The patient was maintained on a cardiac monitor.  I personally viewed and interpreted the cardiac monitored which showed an underlying rhythm of: Sinus tachycardia EKG non-ischemic   Medicines ordered:  I ordered medication including patient received epinephrine prior to arrival in ER, magnesium 2 g, continuous neb at 10 mg, Solu-Medrol, DuoNeb after continuous neb for COPD Reevaluation of the patient after these medicines showed that the patient improved I have reviewed the patients home medicines and have made adjustments as needed   Critical Interventions:   BiPAP, ventilation, beta agonists   Consults/Attending Physician   I discussed this case with my attending physician who cosigned this note including patient's presenting symptoms, physical exam, and planned diagnostics and interventions. Attending physician stated agreement with plan or made changes to plan which were implemented.   Attending physician assessed patient at bedside.   Discussed with Dr. Lorin Mercy who will admit.    Reevaluation:  After the interventions noted above I re-evaluated patient and found that they have  :improved   Social Determinants of Health:      Problem List / ED Course:  Severe COPD exacerbation treated with BiPAP, beta agonists, epinephrine, Solu-Medrol.  She has significantly improved and her hypercarbia has seemingly improved on VBG.  However will need admission for continued care.   Dispostion:  After consideration of the diagnostic results and the patients response to treatment, I feel that the patent would benefit from admission    Final Clinical Impression(s) / ED Diagnoses Final diagnoses:  Acute respiratory failure with hypercapnia ([place])  COPD exacerbation [facility])    Rx / DC Orders ED Discharge Orders     None         Tedd Sias, [place] 38/32/91 [id number]    Delora Fuel, MD 06/00/45 2256

## 2022-05-19 NOTE — ED Notes (Signed)
Placed on Bipap at this time with RT and MD at bedside.

## 2022-05-19 NOTE — H&P (Signed)
History and Physical    Patient: Abigail Castillo DOB: [birth year] DOA: 05/19/2022 DOS: the patient was seen and examined on 05/19/2022 PCP: [name], NP  Patient coming from: SNF - [facility]; NOK: Daughter, [place], [phone]   Chief Complaint: SOB.  HPI: Abigail Castillo is a 71 y.o. female with medical history significant of breast cancer (2022); COPD on 2-3L home O2; and depression/anxiety presenting with SOB.  She was last admitted from 11/15-12/18 for COPD exacerbation, discharged to [facility].  Patient was sent in by her facility with a reported O2 sat down to 65%.  The patient has been on BIPAP and reports feeling tired but otherwise is feeling better at this time.      ER Course:  COPD - terrible baseline disease.  Came in with worsening dyspnea.  Given Epi, mild somnolence.  Placed on BIPAP.  Given Mag++, Solumedrol, Albuterol.  VBG with pCO2 80.7.  Likely prompted by RSV infection.     Review of Systems: unable to review all systems due to the inability of the patient to answer questions. Past Medical History:  Diagnosis Date   Anxiety    Blood transfusion    Blood transfusion without reported diagnosis    Breast cancer ([place]) 05/13/2021   Chronic mental illness    COPD (chronic obstructive pulmonary disease) ([place])    DEPRESSION 05/31/2009   Esophageal stricture 03/14/2015   GERD (gastroesophageal reflux disease)    Hepatic steatosis    Osteoporosis    Oxygen dependent    on O2 at 2-3L/min 24hr/day   Tobacco abuse    ongoing   Vitamin B 12 deficiency    Vitamin D deficiency    Past Surgical History:  Procedure Laterality Date   ABDOMINAL HYSTERECTOMY     BREAST EXCISIONAL BIOPSY Left 2017   BREAST LUMPECTOMY  1972   left breast   BREAST LUMPECTOMY WITH RADIOACTIVE SEED LOCALIZATION Left 11/22/2015   Procedure: LEFT BREAST LUMPECTOMY WITH RADIOACTIVE SEED LOCALIZATION;  Surgeon: Erroll Luna, MD;  Location: [facility];   Service: General;  Laterality: Left;   BREAST LUMPECTOMY WITH RADIOACTIVE SEED LOCALIZATION Left 07/03/2021   Procedure: LEFT BREAST LUMPECTOMY WITH RADIOACTIVE SEED LOCALIZATION;  Surgeon: Erroll Luna, MD;  Location: [facility];  Service: General;  Laterality: Left;   BREAST SURGERY  ?2015   lumpectomy, left   CERVICAL [place]E BIOPSY     CHOLECYSTECTOMY     COLONOSCOPY     ESOPHAGOGASTRODUODENOSCOPY (EGD) WITH ESOPHAGEAL DILATION     UPPER GASTROINTESTINAL ENDOSCOPY     Social History:  reports that she quit smoking about 10 years ago. Her smoking use included cigarettes. She has a 40.00 pack-year smoking history. She has been exposed to tobacco smoke. She has never used smokeless tobacco. She reports that she does not drink alcohol and does not use drugs.  Allergies  Allergen Reactions   Sulfonamide Derivatives Hives    Family History  Problem Relation Age of Onset   Coronary artery disease Mother    Diabetes Mother    Hypertension Mother    Coronary artery disease Father    Diabetes Father        Grandfather   Hypertension Father    Stroke Father    Other Father        colostomy bag   Breast cancer Sister    Breast cancer Sister    Diabetes Sister    Breast cancer Sister    Hypertension Sister    Mental illness  Sister        x 4   Cancer Other        niece--breast   Esophageal cancer Neg Hx    Stomach cancer Neg Hx    Rectal cancer Neg Hx    Colon cancer Neg Hx    Colon polyps Neg Hx     Prior to Admission medications   Medication Sig Start Date End Date Taking? Authorizing Provider  albuterol (PROVENTIL) (2.5 MG/3ML) 0.083% nebulizer solution Take 3 mLs (2.5 mg total) by nebulization every 2 (two) hours as needed for wheezing or shortness of breath. 05/12/22   Hongalgi, Lenis Dickinson, MD  albuterol (VENTOLIN HFA) 108 (90 Base) MCG/ACT inhaler INHALE TWO PUFFS BY MOUTH INTO THE LUNGS EVERY 6 HOURS AS NEEDED FOR WHEEZING OR SHORTNESS OF BREATH Patient taking differently:  Inhale 2 puffs into the lungs every 6 (six) hours as needed for wheezing or shortness of breath. 11/29/21   Collene Gobble, MD  amLODipine (NORVASC) 2.5 MG tablet Take 1 tablet (2.5 mg total) by mouth daily. 04/25/22 05/25/22  Antonieta Pert, MD  anastrozole (ARIMIDEX) 1 MG tablet TAKE ONE TABLET BY MOUTH DAILY 02/14/22   Benay Pike, MD  arformoterol (BROVANA) 15 MCG/2ML NEBU Take 2 mLs (15 mcg total) by nebulization 2 (two) times daily. 05/06/22   Samuella Cota, MD  azithromycin (ZITHROMAX) 500 MG tablet Take 1 tablet (500 mg total) by mouth 3 (three) times a week. 05/07/22   Samuella Cota, MD  Calcium Carbonate-Vitamin D 600-400 MG-UNIT tablet Take 1 tablet by mouth 2 (two) times daily. 02/22/21   [name], NP  cholecalciferol (VITAMIN D) 1000 UNITS tablet Take 3,000 Units by mouth daily.     [provider]  cyanocobalamin (VITAMIN B12) 1000 MCG tablet TAKE ONE TABLET BY MOUTH DAILY 04/28/22   [name], NP  dexlansoprazole (DEXILANT) 60 MG capsule TAKE ONE CAPSULE BY MOUTH DAILY 04/28/22   [name], NP  furosemide (LASIX) 20 MG tablet Take 1 tablet (20 mg total) by mouth daily. 05/05/22   Samuella Cota, MD  ipratropium-albuterol (DUONEB) 0.5-2.5 (3) MG/3ML SOLN Take 3 mLs by nebulization in the morning, at noon, in the evening, and at bedtime. 05/05/22   Samuella Cota, MD  loratadine (CLARITIN) 10 MG tablet Take 1 tablet (10 mg total) by mouth daily. 05/06/22   Samuella Cota, MD  montelukast (SINGULAIR) 10 MG tablet Take 1 tablet (10 mg total) by mouth at bedtime. 05/05/22   Samuella Cota, MD  OLANZapine (ZYPREXA) 5 MG tablet Take 7.5 mg by mouth daily.  06/18/16   [provider]  OXYGEN Inhale 3 L into the lungs.    [provider]  predniSONE (DELTASONE) 10 MG tablet Take 3 tabs (30 mg total) daily x 1 week, then 2 tabs (20 mg total) daily x 1 week, then continue 1 tab (10 Mg total) daily until office visit with  pulmonology. 05/12/22   Hongalgi, Lenis Dickinson, MD  revefenacin (YUPELRI) 175 MCG/3ML nebulizer solution Take 3 mLs (175 mcg total) by nebulization daily. 05/06/22   Samuella Cota, MD  Spacer/Aero-Holding Chambers (AEROCHAMBER MV) inhaler Use as instructed 03/13/17   Collene Gobble, MD  vitamin E 180 MG (400 UNITS) capsule Take 400 Units by mouth daily.    [provider]    Physical Exam: Vitals:   05/19/22 0906 05/19/22 0951 05/19/22 1103 05/19/22 1230  BP:  (!) 113/94 (!) 158/118   Pulse:  Marland Kitchen)  111 (!) 113   Resp:  (!) 22 (!) 23   Temp: 97.7 F (36.5 C)   (!) 97.5 F (36.4 C)  TempSrc: Oral   Oral  SpO2:  100% 100%    General:  Appears calm and comfortable and is in NAD, on BIPAP Eyes:   EOMI, normal lids, iris ENT:  grossly normal hearing, lips & tongue; BIPAP in place Neck:  no LAD, masses or thyromegaly Cardiovascular:  RR with tachycardia, no m/r/g. No LE edema.  Respiratory:   Still with diffuse wheezing and poor air movement despite BIPAP.  Mildly increased respiratory effort. Abdomen:  soft, NT, ND Skin:  no rash or induration seen on limited exam Musculoskeletal:  grossly normal tone BUE/BLE, good ROM, no bony abnormality Psychiatric:  blunted mood and affect, speech limited by BIPAP, AOx3 Neurologic:  CN 2-12 grossly intact, moves all extremities in coordinated fashion   Radiological Exams on Admission: Independently reviewed - see discussion in A/P where applicable  DG Chest Portable 1 View  Result Date: 05/19/2022 CLINICAL DATA:  Shortness of breath EXAM: PORTABLE CHEST 1 VIEW COMPARISON:  Twenty days ago FINDINGS: Severe emphysema with apical lucency and hyperinflation. Interstitial coarsening at the bases is stable. There is no edema, consolidation, effusion, or pneumothorax. Heart size and mediastinal contours. Clips over the left breast. IMPRESSION: Severe emphysema.  No acute finding when compared to prior. Electronically Signed   By: Jorje Guild  M.D.   On: 05/19/2022 07:26    EKG: Independently reviewed.  Sinus tachycardia with rate 128; prolonged QTc 524; nonspecific ST changes with no evidence of acute ischemia   Labs on Admission: I have personally reviewed the available labs and imaging studies at the time of the admission.  Pertinent labs:    Na++ 129 Glucose 223 Anion gap 16 Bili 2.2 VBG: 7.299/80.7/39.6 -> 7.493/48.7/37.4 WBC 20.9 RSV POSITIVE   Assessment and Plan: Principal Problem:   COPD with acute exacerbation ([place]) Active Problems:   Schizoaffective disorder (HCC)   Ductal carcinoma in situ (DCIS) of left breast   RSV (respiratory syncytial virus pneumonia)   Essential hypertension    Acute on chronic respiratory failure associated with a COPD exacerbation caused by RSV infection -Patient's shortness of breath and productive cough are most likely caused by acute COPD exacerbation.  -She was hypoxic to 65% despite home O2 and was placed on BIPAP with improvement -She also had marked hypercapnia on presentation, improved on BIPAP -She does not have fever but has significant leukocytosis.  -Chest x-ray is not consistent with pneumonia -She was given a continuous neb treatment in the ED with some improvement.   -will admit patient - with her markedly decreased O2 sats (into the 60s), it seems likely that she will need several days of hospitalization to show sufficient improvement for discharge. -Nebulizers: scheduled Duoneb and prn albuterol -Solu-Medrol 80 mg IV BID -> Prednisone 40 mg PO daily -IV Cefepime - while she does have a viral infection, she also has severe underlying disease and significant leukocytosis -Continue aformoterol, Yupelri -Coordinated care with TOC team/PT/OT/Nutrition/RT consults -She is on nocturnal BIPAP, will continue  HTN -Continue amlodipine  Breast cancer -s/p lumpectomy in 06/2021 -Declined adjuvant radiation -Continue anastrozole  Schizoaffective d/o -Continue  lorazepam, olanzapine     Advance Care Planning:   Code Status: Full Code  - Code status was discussed with the patient and/or family at the time of admission.  The patient would want to receive full resuscitative measures at this  time.   Consults: RT; PT/OT; nutrition; TOC team  DVT Prophylaxis: Lovenox  Family Communication: None present; I left a message for her daughter at the time of admission  Severity of Illness: The appropriate patient status for this patient is INPATIENT. Inpatient status is judged to be reasonable and necessary in order to provide the required intensity of service to ensure the patient's safety. The patient's presenting symptoms, physical exam findings, and initial radiographic and laboratory data in the context of their chronic comorbidities is felt to place them at high risk for further clinical deterioration. Furthermore, it is not anticipated that the patient will be medically stable for discharge from the hospital within 2 midnights of admission.   * I certify that at the point of admission it is my clinical judgment that the patient will require inpatient hospital care spanning beyond 2 midnights from the point of admission due to high intensity of service, high risk for further deterioration and high frequency of surveillance required.*  Author: Karmen Bongo, MD 05/19/2022 1:44 PM  For on call review [email or web address].

## 2022-05-19 NOTE — ED Notes (Signed)
Pt on BiPap with continuous nebulizer treatment.  Pt breathing labored at this time.  PA [place] at bedside.

## 2022-05-20 ENCOUNTER — Telehealth: Payer: Self-pay | Admitting: Internal Medicine

## 2022-05-20 DIAGNOSIS — J9602 Acute respiratory failure with hypercapnia: Secondary | ICD-10-CM

## 2022-05-20 DIAGNOSIS — J441 Chronic obstructive pulmonary disease with (acute) exacerbation: Secondary | ICD-10-CM | POA: Diagnosis not present

## 2022-05-20 DIAGNOSIS — E440 Moderate protein-calorie malnutrition: Secondary | ICD-10-CM | POA: Insufficient documentation

## 2022-05-20 LAB — BASIC METABOLIC PANEL
Anion gap: 11 (ref 5–15)
BUN: 8 mg/dL (ref 8–23)
CO2: 30 mmol/L (ref 22–32)
Calcium: 8.6 mg/dL — ABNORMAL LOW (ref 8.9–10.3)
Chloride: 91 mmol/L — ABNORMAL LOW (ref 98–111)
Creatinine, Ser: 0.5 mg/dL (ref 0.44–1.00)
GFR, Estimated: >60 mL/min (ref >60)
Glucose, Bld: 201 mg/dL — ABNORMAL HIGH (ref 70–99)
Potassium: 4 mmol/L (ref 3.5–5.1)
Sodium: 132 mmol/L — ABNORMAL LOW (ref 135–145)

## 2022-05-20 LAB — PROCALCITONIN: Procalcitonin: 0.14 ng/mL

## 2022-05-20 LAB — CBC
HCT: 38.7 % (ref 36.0–46.0)
Hemoglobin: 13.2 g/dL (ref 12.0–15.0)
MCH: 32.5 pg (ref 26.0–34.0)
MCHC: 34.1 g/dL (ref 30.0–36.0)
MCV: 95.3 fL (ref 80.0–100.0)
Platelets: 230 10*3/uL (ref 150–400)
RBC: 4.06 MIL/uL (ref 3.87–5.11)
RDW: 13.2 % (ref 11.5–15.5)
WBC: 14.4 10*3/uL — ABNORMAL HIGH (ref 4.0–10.5)
nRBC: 0 % (ref 0.0–0.2)

## 2022-05-20 LAB — LACTIC ACID, PLASMA: Lactic Acid, Venous: 1 mmol/L (ref 0.5–1.9)

## 2022-05-20 MED ORDER — IPRATROPIUM BROMIDE 0.02 % IN SOLN
0.5000 mg | Freq: Four times a day (QID) | RESPIRATORY_TRACT | Status: DC
  Administered 2022-05-20 – 2022-05-22 (×9): 0.5 mg via RESPIRATORY_TRACT
  Filled 2022-05-20 (×9): qty 2.5

## 2022-05-20 MED ORDER — ADULT MULTIVITAMIN W/MINERALS CH
1.0000 | ORAL_TABLET | Freq: Every day | ORAL | Status: DC
  Administered 2022-05-20 – 2022-05-26 (×5): 1 via ORAL
  Filled 2022-05-20 (×5): qty 1

## 2022-05-20 MED ORDER — SODIUM CHLORIDE 0.9 % IV SOLN
2.0000 g | INTRAVENOUS | Status: AC
  Administered 2022-05-20 – 2022-05-26 (×7): 2 g via INTRAVENOUS
  Filled 2022-05-20 (×7): qty 20

## 2022-05-20 MED ORDER — METHYLPREDNISOLONE SODIUM SUCC 125 MG IJ SOLR: 125.0000 mg | Freq: Once | INTRAMUSCULAR | Status: DC

## 2022-05-20 MED ORDER — METHYLPREDNISOLONE SODIUM SUCC 125 MG IJ SOLR
60.0000 mg | Freq: Every day | INTRAMUSCULAR | Status: DC
  Administered 2022-05-21: 60 mg via INTRAVENOUS
  Filled 2022-05-20: qty 2

## 2022-05-20 MED ORDER — ENSURE ENLIVE PO LIQD
237.0000 mL | Freq: Two times a day (BID) | ORAL | Status: DC
  Administered 2022-05-20 – 2022-05-26 (×5): 237 mL via ORAL

## 2022-05-20 MED ORDER — METOPROLOL TARTRATE 5 MG/5ML IV SOLN
5.0000 mg | INTRAVENOUS | Status: AC | PRN
  Administered 2022-05-20: 5 mg via INTRAVENOUS
  Filled 2022-05-20: qty 5

## 2022-05-20 MED ORDER — GUAIFENESIN-DM 100-10 MG/5ML PO SYRP
10.0000 mL | ORAL_SOLUTION | Freq: Three times a day (TID) | ORAL | Status: DC
  Administered 2022-05-20 – 2022-05-26 (×11): 10 mL via ORAL
  Filled 2022-05-20 (×13): qty 10

## 2022-05-20 MED ORDER — LEVALBUTEROL HCL 1.25 MG/0.5ML IN NEBU
1.2500 mg | INHALATION_SOLUTION | Freq: Four times a day (QID) | RESPIRATORY_TRACT | Status: DC
  Administered 2022-05-20 – 2022-05-22 (×9): 1.25 mg via RESPIRATORY_TRACT
  Filled 2022-05-20 (×10): qty 0.5

## 2022-05-20 MED ORDER — HYDROCOD POLI-CHLORPHE POLI ER 10-8 MG/5ML PO SUER
5.0000 mL | Freq: Two times a day (BID) | ORAL | Status: DC
  Administered 2022-05-20 – 2022-05-26 (×10): 5 mL via ORAL
  Filled 2022-05-20 (×12): qty 5

## 2022-05-20 MED ORDER — METHYLPREDNISOLONE SODIUM SUCC 40 MG IJ SOLR: 40.0000 mg | Freq: Every day | INTRAMUSCULAR | Status: DC

## 2022-05-20 MED ORDER — FLORANEX PO PACK
1.0000 g | PACK | Freq: Three times a day (TID) | ORAL | Status: DC
  Administered 2022-05-23 – 2022-05-26 (×6): 1 g via ORAL
  Filled 2022-05-20 (×23): qty 1

## 2022-05-20 MED ORDER — METHYLPREDNISOLONE SODIUM SUCC 125 MG IJ SOLR
125.0000 mg | Freq: Once | INTRAMUSCULAR | Status: AC
  Administered 2022-05-20: 125 mg via INTRAVENOUS
  Filled 2022-05-20: qty 2

## 2022-05-20 NOTE — Progress Notes (Signed)
PT Cancellation Note  Patient Details Name: Abigail Castillo MRN: [id number] DOB: July 24, 1950   Cancelled Treatment:    Reason Eval/Treat Not Completed: Medical issues which prohibited therapy. Unstable medically per MD, retry tomorrow as pt can allow.   Ramond Dial 05/20/2022, 10:27 AM  Mee Hives, PT PhD Acute Rehab Dept. Number: [place] and [place]

## 2022-05-20 NOTE — Progress Notes (Signed)
Initial Nutrition Assessment  DOCUMENTATION CODES:   Non-severe (moderate) malnutrition in context of acute illness/injury  INTERVENTION:  Adjust diet to dysphagia 3 for ease-of-chewing Ensure Enlive po BID, each supplement provides 350 kcal and 20 grams of protein (provide with all medications to optimize PO intake) MVI with minerals daily  NUTRITION DIAGNOSIS:   Moderate Malnutrition related to acute illness (COPD exacerbation) as evidenced by mild fat depletion, mild muscle depletion, percent weight loss.  GOAL:   Patient will meet greater than or equal to 90% of their needs  MONITOR:   PO intake, Supplement acceptance, Labs, Weight trends  REASON FOR ASSESSMENT:   Consult Other (Comment) (nutrition goals')  ASSESSMENT:   Pt admitted from [facility] SNF with SOB r/t COPD exacerbation. PMH significant for breast cancer (2022), COPD on 2-3L home O2, depression/anxiety. Recently admitted 11/15-12/18 for COPD exacerbation.  Pt with BiPAP on at time of visit, difficult to understand pt. No family present. Unable to obtain detailed nutrition related history at this time.   No documented meal completions on file to review. RN present providing medications. He reports she has been unable to eat d/t BiPAP requirement. RN also mentioned pt is edentulous and does not have her dentures with her. Will adjust diet so that when pt is able to eat she will be provided with easy to chew foods.   Reviewed weight history. Within the last year, her weight has fluctuated between 63-65 kg. During her prior admission, her weight was noted to be 67.8 kg. Her documented weight today is 61.7 kg. This is a weight loss of 9% which is clinically significant for time frame.   Pt currently meets criteria for acute malnutrition based on recent admission 11/15-12/18 and current admit for recurrence of COPD exacerbation. Question whether there is a chronic underlying component. Will reassess once able to obtain  more detailed nutrition related history.   Medications: colace, lactobacillus, protonix, prednisone, IV abx  Labs: sodium 132, ionized calcium 0.89  I/O's: +466m since admit  NUTRITION - FOCUSED PHYSICAL EXAM:  Flowsheet Row Most Recent Value  Orbital Region Unable to assess  Upper Arm Region Mild depletion  Thoracic and Lumbar Region Mild depletion  Buccal Region Unable to assess  Temple Region No depletion  Clavicle Bone Region No depletion  Clavicle and Acromion Bone Region Mild depletion  Scapular Bone Region Mild depletion  Dorsal Hand Mild depletion  Patellar Region Mild depletion  Anterior Thigh Region Mild depletion  Posterior Calf Region Mild depletion  Edema (RD Assessment) None  Hair Reviewed  Eyes Reviewed  Mouth Unable to assess  Skin Reviewed  Nails Reviewed       Diet Order:   Diet Order             DIET DYS 3 Room service appropriate? Yes; Fluid consistency: Thin  Diet effective now                   EDUCATION NEEDS:   Not appropriate for education at this time  Skin:  Skin Assessment: Reviewed RN Assessment  Last BM:  PTA  Height:   Ht Readings from Last 1 Encounters:  04/10/22 '5\' 2"'$  (1.575 m)    Weight:   Wt Readings from Last 1 Encounters:  05/20/22 61.7 kg   BMI:  Body mass index is 24.88 kg/m.  Estimated Nutritional Needs:   Kcal:  1500-1700  Protein:  75-90g  Fluid:  >/=1.5L  AClayborne Dana RDN, LDN Clinical Nutrition

## 2022-05-20 NOTE — Consult Note (Signed)
NAME:  Abigail Castillo, MRN:  [id number], DOB:  Sep 25, 1950, LOS: 1 ADMISSION DATE:  05/19/2022, CONSULTATION DATE:  05/20/22 REFERRING MD:  Roger Shelter CHIEF COMPLAINT:  Dyspnea   History of Present Illness:  Abigail Castillo is a 71 y.o. female who has a PMH as below including but not limited to COPD on chronic 2-3L O2 and '10mg'$  Prednisone, OSA on nocturnal BiPAP, and who is followed by Dr. Lamonte Sakai in our office. She had recent admission 11/15 through 12/18 for AECOPD and was then discharged to [facility].  She was recently seen as an outpatient by Dr. Lamonte Sakai on 12/22 and was prescribed a slow prednisone taper with instructions to drop to '10mg'$  and continue until her next follow up appointment.  She presented back to [facility] ED 12/25 with dyspnea and hypoxia down to 65%. She was found to have hypoxic and hypercapnic respiratory failure and her RSV swab returned positive. She was placed on BiPAP and was admitted by [facility].  On 12/26, PCCM was consulted for assistance with BiPAP and COPD management.  Pertinent  Medical History:  has History of tobacco abuse; Depression; COPD (chronic obstructive pulmonary disease) ([place]); Osteoporosis; HOARSENESS, CHRONIC; HYPERTRIGLYCERIDEMIA; Memory change; B12 deficiency; Personal history of colonic polyps; Encounter for routine gynecological examination; Mild hyperlipidemia; Vitamin D deficiency; Esophageal stricture; GERD (gastroesophageal reflux disease); Schizoaffective disorder ([place]); Allergic rhinitis; Chronic respiratory failure ([place]); Acute on chronic respiratory failure with hypoxia ([place]); Elevated blood pressure reading without diagnosis of hypertension; Hepatic steatosis; Ductal carcinoma in situ (DCIS) of left breast; On home oxygen therapy; Hyponatremia; Bronchomalacia; Localized edema; COPD exacerbation ([place]); Tracheobronchomalacia; Acute on chronic respiratory failure with hypoxia and hypercapnia (HCC); COPD with acute exacerbation ([place]); RSV (respiratory syncytial  virus pneumonia); and Essential hypertension on their problem list.  Significant Hospital Events: Including procedures, antibiotic start and stop dates in addition to other pertinent events   12/25 admit. 12/26 PCCM consult.  Interim History / Subjective:  On BiPAP. Wheezes R > L. Able to speak to me through BiPAP. Nods head and says breathing is "ok".  Objective:  Blood pressure 130/71, pulse (!) 120, temperature 98 F (36.7 C), temperature source Axillary, resp. rate 20, weight 61.7 kg, SpO2 96 %.    Vent Mode: BIPAP;PCV FiO2 (%):  [40 %] 40 % Set Rate:  [10 bmp] 10 bmp PEEP:  [5 cmH20] 5 cmH20   Intake/Output Summary (Last 24 hours) at 05/20/2022 1335 Last data filed at 05/19/2022 2256 Gross per 24 hour  Intake 487.51 ml  Output --  Net 487.51 ml   Filed Weights   05/20/22 0030  Weight: 61.7 kg    Examination: General: Adult female, chronically ill appearing, in NAD. Neuro: Opens eyes to voice, follows basic commands. HEENT: [place]/AT. Sclerae anicteric. BiPAP in place. Cardiovascular: RRR, no M/R/G.  Lungs: Wheezes bilaterally R > L. Upper airway expiratory wheeze bilaterally. Abdomen: BS x 4, soft, NT/ND.  Musculoskeletal: No gross deformities, no edema.  Skin: Intact, warm, no rashes.  Labs/imaging personally reviewed:  CXR 12/26  >emphysema.  Assessment & Plan:   AECOPD - 2/2 RSV +/- bacterial component. Acute on chronic hypoxic and hypercarbic respiratory failure. - Continue BiPAP for now given wheezing and slightly increased WOB. - Transition back to  as able. - Additional '125mg'$  Solumedrol x 1 then transition to '60mg'$  daily (d/c Prednisone). - Continue BD's. - Change Cefepime to Ceftriaxone. OK for 5 days then low threshold to stop.  Hx OSA. - Continue nocturnal BiPAP.  Best practice (evaluated  daily):  Per primary team.  Labs   CBC: Recent Labs  Lab 05/19/22 0655 05/19/22 0701 05/19/22 0835 05/19/22 1033 05/20/22 0105  WBC 20.9*  --   --    --  14.4*  HGB 13.9 15.3* 15.0 14.3 13.2  HCT 42.6 45.0 44.0 42.0 38.7  MCV 96.2  --   --   --  95.3  PLT 271  --   --   --  536    Basic Metabolic Panel: Recent Labs  Lab 05/19/22 0655 05/19/22 0701 05/19/22 0835 05/19/22 1033 05/20/22 0105  NA 129* 125* 125* 122* 132*  K 3.6 3.5 3.1* 5.3* 4.0  CL 85* 83*  --   --  91*  CO2 28  --   --   --  30  GLUCOSE 223* 226*  --   --  201*  BUN 10 11  --   --  8  CREATININE 0.71 0.70  --   --  0.50  CALCIUM 8.7*  --   --   --  8.6*   GFR: Estimated Creatinine Clearance: 56.5 mL/min (by C-G formula based on SCr of 0.5 mg/dL). Recent Labs  Lab 05/19/22 0655 05/20/22 0105 05/20/22 0835  PROCALCITON  --  0.14  --   WBC 20.9* 14.4*  --   LATICACIDVEN  --   --  1.0    Liver Function Tests: Recent Labs  Lab 05/19/22 0655  AST 27  ALT 42  ALKPHOS 66  BILITOT 2.2*  PROT 5.9*  ALBUMIN 3.9   No results for input(s): "LIPASE", "AMYLASE" in the last 168 hours. No results for input(s): "AMMONIA" in the last 168 hours.  ABG    Component Value Date/Time   PHART 7.339 (L) 02/05/2010 1540   PCO2ART 64.2 (HH) 02/05/2010 1540   PO2ART 107.0 (H) 02/05/2010 1540   HCO3 37.4 (H) 05/19/2022 1033   TCO2 39 (H) 05/19/2022 1033   O2SAT 99 05/19/2022 1033     Coagulation Profile: No results for input(s): "INR", "PROTIME" in the last 168 hours.  Cardiac Enzymes: No results for input(s): "CKTOTAL", "CKMB", "CKMBINDEX", "TROPONINI" in the last 168 hours.  HbA1C: Hgb A1c MFr Bld  Date/Time Value Ref Range Status  10/28/2021 08:41 AM 5.3 4.6 - 6.5 % Final    Comment:    Glycemic Control Guidelines for People with Diabetes:Non Diabetic:  <6%Goal of Therapy: <7%Additional Action Suggested:  >8%   02/03/2014 10:23 AM 5.4 4.6 - 6.5 % Final    Comment:    Glycemic Control Guidelines for People with Diabetes:Non Diabetic:  <6%Goal of Therapy: <7%Additional Action Suggested:  >8%     CBG: No results for input(s): "GLUCAP" in the last 168  hours.  Review of Systems:   Unable to obtain as pt is on BiPAP.  Past Medical History:  She,  has a past medical history of Anxiety, Blood transfusion, Blood transfusion without reported diagnosis, Breast cancer ([place]) (05/13/2021), Chronic mental illness, COPD (chronic obstructive pulmonary disease) ([place]), DEPRESSION (05/31/2009), Esophageal stricture (03/14/2015), GERD (gastroesophageal reflux disease), Hepatic steatosis, Osteoporosis, Oxygen dependent, Tobacco abuse, Vitamin B 12 deficiency, and Vitamin D deficiency.   Surgical History:   Past Surgical History:  Procedure Laterality Date   ABDOMINAL HYSTERECTOMY     BREAST EXCISIONAL BIOPSY Left 2017   BREAST LUMPECTOMY  1972   left breast   BREAST LUMPECTOMY WITH RADIOACTIVE SEED LOCALIZATION Left 11/22/2015   Procedure: LEFT BREAST LUMPECTOMY WITH RADIOACTIVE SEED LOCALIZATION;  Surgeon: Erroll Luna, MD;  Location: [facility] OR;  Service: General;  Laterality: Left;   BREAST LUMPECTOMY WITH RADIOACTIVE SEED LOCALIZATION Left 07/03/2021   Procedure: LEFT BREAST LUMPECTOMY WITH RADIOACTIVE SEED LOCALIZATION;  Surgeon: Erroll Luna, MD;  Location: [place];  Service: General;  Laterality: Left;   BREAST SURGERY  ?2015   lumpectomy, left   CERVICAL [place]E BIOPSY     CHOLECYSTECTOMY     COLONOSCOPY     ESOPHAGOGASTRODUODENOSCOPY (EGD) WITH ESOPHAGEAL DILATION     UPPER GASTROINTESTINAL ENDOSCOPY       Social History:   reports that she quit smoking about 10 years ago. Her smoking use included cigarettes. She has a 40.00 pack-year smoking history. She has been exposed to tobacco smoke. She has never used smokeless tobacco. She reports that she does not drink alcohol and does not use drugs.   Family History:  Her family history includes Breast cancer in her sister, sister, and sister; Cancer in an other family member; Coronary artery disease in her father and mother; Diabetes in her father, mother, and sister; Hypertension in her father,  mother, and sister; Mental illness in her sister; Other in her father; Stroke in her father. There is no history of Esophageal cancer, Stomach cancer, Rectal cancer, Colon cancer, or Colon polyps.   Allergies Allergies  Allergen Reactions   Sulfonamide Derivatives Hives     Home Medications  Prior to Admission medications   Medication Sig Start Date End Date Taking? Authorizing Provider  albuterol (PROVENTIL) (2.5 MG/3ML) 0.083% nebulizer solution Take 3 mLs (2.5 mg total) by nebulization every 2 (two) hours as needed for wheezing or shortness of breath. 05/12/22  Yes Hongalgi, Lenis Dickinson, MD  albuterol (VENTOLIN HFA) 108 (90 Base) MCG/ACT inhaler INHALE TWO PUFFS BY MOUTH INTO THE LUNGS EVERY 6 HOURS AS NEEDED FOR WHEEZING OR SHORTNESS OF BREATH Patient taking differently: Inhale 2 puffs into the lungs every 6 (six) hours as needed for wheezing or shortness of breath. 11/29/21  Yes Collene Gobble, MD  amLODipine (NORVASC) 2.5 MG tablet Take 1 tablet (2.5 mg total) by mouth daily. Patient taking differently: Take 2.5 mg by mouth in the morning. (0900) 04/25/22 05/25/22 Yes Kc, Maren Beach, MD  anastrozole (ARIMIDEX) 1 MG tablet TAKE ONE TABLET BY MOUTH DAILY Patient taking differently: Take 1 mg by mouth in the morning. (0900) 02/14/22  Yes Iruku, Arletha Pili, MD  arformoterol (BROVANA) 15 MCG/2ML NEBU Take 2 mLs (15 mcg total) by nebulization 2 (two) times daily. Patient taking differently: Take 15 mcg by nebulization 2 (two) times daily. (0900, 2100) 05/06/22  Yes Samuella Cota, MD  azithromycin (ZITHROMAX) 500 MG tablet Take 1 tablet (500 mg total) by mouth 3 (three) times a week. Patient taking differently: Take 500 mg by mouth every Monday, Wednesday, and Friday. 05/07/22  Yes Samuella Cota, MD  bisacodyl (DULCOLAX) 10 MG suppository Place 10 mg rectally daily as needed (constipation not relieved by Milk of Magnesia).   Yes [provider]  Calcium Carbonate-Vitamin D 600-400 MG-UNIT  tablet Take 1 tablet by mouth 2 (two) times daily. Patient taking differently: Take 1 tablet by mouth 2 (two) times daily. (0900, 2100) 02/22/21  Yes [name], NP  cholecalciferol (VITAMIN D3) 25 MCG (1000 UNIT) tablet Take 3,000 Units by mouth in the morning. (0900)   Yes [provider]  cyanocobalamin (VITAMIN B12) 1000 MCG tablet TAKE ONE TABLET BY MOUTH DAILY Patient taking differently: Take 1,000 mcg by mouth in the morning. (0900) 04/28/22  Yes [name],  NP  dexlansoprazole (DEXILANT) 60 MG capsule TAKE ONE CAPSULE BY MOUTH DAILY Patient taking differently: Take 60 mg by mouth in the morning. (0900) 04/28/22  Yes [name], NP  Eyelid Cleansers (AVENOVA EX) Place 3 sprays into both eyes 2 (two) times daily. (0900, 1700)   Yes [provider]  furosemide (LASIX) 20 MG tablet Take 1 tablet (20 mg total) by mouth daily. Patient taking differently: Take 20 mg by mouth in the morning. (0900) 05/05/22  Yes Samuella Cota, MD  ipratropium-albuterol (DUONEB) 0.5-2.5 (3) MG/3ML SOLN Take 3 mLs by nebulization in the morning, at noon, in the evening, and at bedtime. 05/05/22  Yes Samuella Cota, MD  loratadine (CLARITIN) 10 MG tablet Take 1 tablet (10 mg total) by mouth daily. Patient taking differently: Take 10 mg by mouth in the morning. (0900) 05/06/22  Yes Samuella Cota, MD  LORazepam (ATIVAN) 1 MG tablet Take 1 mg by mouth 2 (two) times daily as needed for anxiety.   Yes [provider]  Magnesium Hydroxide (MILK OF MAGNESIA PO) Take 30 mLs by mouth daily as needed (no BM in 3 days).   Yes [provider]  OLANZapine (ZYPREXA) 7.5 MG tablet Take 7.5 mg by mouth in the morning. (0900)   Yes [provider]  OXYGEN Inhale 3 L into the lungs continuous.   Yes [provider]  predniSONE (DELTASONE) 10 MG tablet Take 3 tabs (30 mg total) daily x 1 week, then 2 tabs (20 mg total) daily x 1 week, then  continue 1 tab (10 Mg total) daily until office visit with pulmonology. Patient taking differently: Take 10-30 mg by mouth in the morning. 30 mg once daily for 7 days, 20 mg once daily for 7 days, 10 mg once daily until office visit with pulmonology 05/12/22  Yes Hongalgi, Lenis Dickinson, MD  revefenacin (YUPELRI) 175 MCG/3ML nebulizer solution Take 3 mLs (175 mcg total) by nebulization daily. Patient taking differently: Take 175 mcg by nebulization in the morning. (0900) 05/06/22  Yes Samuella Cota, MD  Sodium Phosphates (ENEMA DISPOSABLE RE) Place 1 enema rectally daily as needed (constipation not relieved by bisacodyl).   Yes [provider]  vitamin E 180 MG (400 UNITS) capsule Take 400 Units by mouth in the morning. (0900)   Yes [provider]  Spacer/Aero-Holding Chambers (AEROCHAMBER MV) inhaler Use as instructed 03/13/17   Collene Gobble, MD     Critical care time: 30 min.   Montey Hora, [facility] For pager details, please see AMION or use Epic chat  After 1900, please call [facility] for cross coverage needs 05/20/2022, 1:35 PM

## 2022-05-20 NOTE — Progress Notes (Signed)
PROGRESS NOTE    Patient: Abigail Castillo                            PCP: [name], NP                    DOB: 1950-11-28            DOA: 05/19/2022 PJK:[id number]             DOS: 05/20/2022, 12:22 PM   LOS: 1 day   Date of Service: The patient was seen and examined on 05/20/2022  Subjective:   The patient was seen and examined, remains awake anxious, on BiPAP Using some assessor muscles, difficulty speaking  Tachypnea, tachycardia, on BiPAP 40% satting 96%  Brief Narrative:   Abigail Castillo is a 71 y.o. female with medical history significant of breast cancer (2022); COPD on 2-3L home O2; and depression/anxiety presenting with SOB.  She was last admitted from 11/15-12/18 for COPD exacerbation, discharged to [facility].  Patient was sent in by her facility with a reported O2 sat down to 65%.  The patient has been on BIPAP and reports feeling tired but otherwise is feeling better at this time.         ER Course:  COPD - terrible baseline disease.  Came in with worsening dyspnea.  Given Epi, mild somnolence.  Placed on BIPAP.  Given Mag++, Solumedrol, Albuterol.  VBG with pCO2 80.7.  Likely prompted by RSV infection.     Assessment and Plan: Principal Problem:   COPD with acute exacerbation ([place]) Active Problems:   Schizoaffective disorder ([place])   Ductal carcinoma in situ (DCIS) of left breast   RSV (respiratory syncytial virus pneumonia)   Essential hypertension     Acute on chronic respiratory failure associated with a COPD exacerbation caused by RSV infection Stable- still on BiPAP  Blood pressure 130/71, pulse (!) 120, temperature 98 F (36.7 C), RR 36-20, weight 61.7 kg, SpO2 96 % on BiPAP 40%  ABG    Component Value Date/Time   PHART 7.339 (L) 02/05/2010 1540   PCO2ART 64.2 (HH) 02/05/2010 1540   PO2ART 107.0 (H) 02/05/2010 1540   HCO3 37.4 (H) 05/19/2022 1033   TCO2 39 (H) 05/19/2022 1033   O2SAT 99 05/19/2022 1033    -Patient's shortness of  breath and productive cough are most likely caused by acute COPD exacerbation and RSV infection  -She was hypoxic to 65% despite home O2 and was placed on BIPAP with improvement  -She also had marked hypercapnia on presentation, improved on BIPAP  -Currently afebrile, normotensive,  -Chest x-ray is not consistent with pneumonia, WBC 20.9 >>14.4, lactic acid 1.0, Pro-Cal 0.14  -Nebulizers: scheduled Duoneb and prn albuterol >> switch to Xopenex/Atrovent due to tachycardia  -Will continue with Solu-Medrol 80 mg IV BID -> Prednisone 40 mg PO daily,  -IV Cefepime -empiric treatment -- while she does have a viral infection, she also has severe underlying disease and significant leukocytosis   Pulmonary consulted:  Appreciate further evaluation recommendations -Continue aformoterol, Yupelri  -Coordinated care with TOC team/PT/OT/Nutrition/RT consults  -She remained stable on BiPAP currently ABG as needed   HTN -Continue amlodipine   Breast cancer -s/p lumpectomy in 06/2021 -Declined adjuvant radiation -Continue anastrozole   Schizoaffective d/o -Continue lorazepam, olanzapine      ---------------------------------------------------------------------------------------------------------------------------------- Nutritional status:  The patient's BMI is: Body mass index is 24.88 kg/m. I agree with the  assessment and plan as outlined ---------------------------------------------------------------------------------------------------------------------------------------  DVT prophylaxis:  enoxaparin (LOVENOX) injection 40 mg Start: 05/19/22 1200   Code Status:   Code Status: Full Code  Family Communication: No family member present at bedside- attempt will be made to update daily The above findings and plan of care has been discussed with patient (and family)  in detail,  they expressed understanding and agreement of above. -Advance care planning has been discussed.    Admission status:   Status is: Inpatient Remains inpatient appropriate because: Respiratory failure requiring BiPAP... Needing aggressive management     Procedures:   No admission procedures for hospital encounter.   Antimicrobials:  Anti-infectives (From admission, onward)    Start     Dose/Rate Route Frequency Ordered Stop   05/19/22 1115  ceFEPIme (MAXIPIME) 2 g in sodium chloride 0.9 % 100 mL IVPB        2 g 200 mL/hr over 30 Minutes Intravenous Every 8 hours 05/19/22 1105 05/24/22 1359        Medication:   albuterol  10 mg/hr Nebulization Once   amLODipine  2.5 mg Oral q AM   anastrozole  1 mg Oral q AM   arformoterol  15 mcg Nebulization BID   chlorpheniramine-HYDROcodone  5 mL Oral Q12H   docusate sodium  100 mg Oral BID   enoxaparin (LOVENOX) injection  40 mg Subcutaneous Q24H   feeding supplement  237 mL Oral BID BM   guaiFENesin-dextromethorphan  10 mL Oral Q8H   ipratropium  0.5 mg Nebulization Q6H   lactobacillus  1 g Oral TID WC   levalbuterol  1.25 mg Nebulization Q6H   loratadine  10 mg Oral q AM   multivitamin with minerals  1 tablet Oral Daily   OLANZapine  7.5 mg Oral q AM   pantoprazole  40 mg Oral Daily   predniSONE  40 mg Oral Q breakfast   revefenacin  175 mcg Nebulization q AM   sodium chloride flush  3 mL Intravenous Q12H    acetaminophen **OR** acetaminophen, albuterol, bisacodyl, hydrALAZINE, LORazepam, ondansetron **OR** ondansetron (ZOFRAN) IV, polyethylene glycol   Objective:   Vitals:   05/20/22 0344 05/20/22 0612 05/20/22 0812 05/20/22 0837  BP:  135/86 130/71   Pulse:  (!) 104 (!) 120   Resp:  20 20   Temp:  98 F (36.7 C)    TempSrc:  Axillary    SpO2: 100% 96% 94% 96%  Weight:        Intake/Output Summary (Last 24 hours) at 05/20/2022 1222 Last data filed at 05/19/2022 2256 Gross per 24 hour  Intake 487.51 ml  Output --  Net 487.51 ml   Filed Weights   05/20/22 0030  Weight: 61.7 kg     Examination:    Physical Exam  Constitution: Awake, anxious, on BiPAP with shortness of breath HEENT:        Normocephalic, PERRL, otherwise with in Normal limits  Chest:         Tachypneic .  Positive air sounds  cardio vascular:  S1/S2, RRR, No murmure, No Rubs or Gallops  pulmonary: Diffuse wheezing, rhonchi, poor air exchange and mid to lower lobe bilaterally Abdomen: Soft, non-tender, non-distended, bowel sounds,no masses, no organomegaly Muscular skeletal: Limited exam -global generalized weaknesses, currently admitted in bed, able to move all 4 extremities,   Neuro: CNII-XII intact. , normal motor and sensation, reflexes intact  Extremities: No pitting edema lower extremities, +2 pulses  Skin: Dry, warm to touch, negative  for any Rashes, No open wounds Wounds: per nursing documentation   ------------------------------------------------------------------------------------------------------------------------------------------    LABs:     Latest Ref Rng & Units 05/20/2022    1:05 AM 05/19/2022   10:33 AM 05/19/2022    8:35 AM  CBC  WBC 4.0 - 10.5 K/uL 14.4     Hemoglobin 12.0 - 15.0 g/dL 13.2  14.3  15.0   Hematocrit 36.0 - 46.0 % 38.7  42.0  44.0   Platelets 150 - 400 K/uL 230         Latest Ref Rng & Units 05/20/2022    1:05 AM 05/19/2022   10:33 AM 05/19/2022    8:35 AM  CMP  Glucose 70 - 99 mg/dL 201     BUN 8 - 23 mg/dL 8     Creatinine 0.44 - 1.00 mg/dL 0.50     Sodium 135 - 145 mmol/L 132  122  125   Potassium 3.5 - 5.1 mmol/L 4.0  5.3  3.1   Chloride 98 - 111 mmol/L 91     CO2 22 - 32 mmol/L 30     Calcium 8.9 - 10.3 mg/dL 8.6          Micro Results Recent Results (from the past 240 hour(s))  Resp panel by RT-PCR (RSV, Flu A&B, Covid) Anterior Nasal Swab     Status: Abnormal   Collection Time: 05/19/22  6:47 AM   Specimen: Anterior Nasal Swab  Result Value Ref Range Status   SARS Coronavirus 2 by RT PCR NEGATIVE NEGATIVE Final    Comment: (NOTE) SARS-CoV-2  target nucleic acids are NOT DETECTED.  The SARS-CoV-2 RNA is generally detectable in upper respiratory specimens during the acute phase of infection. The lowest concentration of SARS-CoV-2 viral copies this assay can detect is 138 copies/mL. A negative result does not preclude SARS-Cov-2 infection and should not be used as the sole basis for treatment or other patient management decisions. A negative result may occur with  improper specimen collection/handling, submission of specimen other than nasopharyngeal swab, presence of viral mutation(s) within the areas targeted by this assay, and inadequate number of viral copies(<138 copies/mL). A negative result must be combined with clinical observations, patient history, and epidemiological information. The expected result is Negative.  Fact Sheet for Patients:  EntrepreneurPulse.com.au  Fact Sheet for Healthcare Providers:  IncredibleEmployment.be  This test is no t yet approved or cleared by the [place] FDA and  has been authorized for detection and/or diagnosis of SARS-CoV-2 by FDA under an Emergency Use Authorization (EUA). This EUA will remain  in effect (meaning this test can be used) for the duration of the COVID-19 declaration under Section 564(b)(1) of the Act, 21 U.S.C.section 360bbb-3(b)(1), unless the authorization is terminated  or revoked sooner.       Influenza A by PCR NEGATIVE NEGATIVE Final   Influenza B by PCR NEGATIVE NEGATIVE Final    Comment: (NOTE) The Xpert Xpress SARS-CoV-2/FLU/RSV plus assay is intended as an aid in the diagnosis of influenza from Nasopharyngeal swab specimens and should not be used as a sole basis for treatment. Nasal washings and aspirates are unacceptable for Xpert Xpress SARS-CoV-2/FLU/RSV testing.  Fact Sheet for Patients: EntrepreneurPulse.com.au  Fact Sheet for Healthcare  Providers: IncredibleEmployment.be  This test is not yet approved or cleared by the [place] FDA and has been authorized for detection and/or diagnosis of SARS-CoV-2 by FDA under an Emergency Use Authorization (EUA). This EUA will remain in effect (meaning this test can  be used) for the duration of the COVID-19 declaration under Section 564(b)(1) of the Act, 21 U.S.C. section 360bbb-3(b)(1), unless the authorization is terminated or revoked.     Resp Syncytial Virus by PCR POSITIVE (A) NEGATIVE Final    Comment: (NOTE) Fact Sheet for Patients: EntrepreneurPulse.com.au  Fact Sheet for Healthcare Providers: IncredibleEmployment.be  This test is not yet approved or cleared by the [place] FDA and has been authorized for detection and/or diagnosis of SARS-CoV-2 by FDA under an Emergency Use Authorization (EUA). This EUA will remain in effect (meaning this test can be used) for the duration of the COVID-19 declaration under Section 564(b)(1) of the Act, 21 U.S.C. section 360bbb-3(b)(1), unless the authorization is terminated or revoked.  Performed at [facility], [address], [place], [place] [address]     Radiology Reports No results found.  SIGNED: Deatra James, MD, FHM. [facility],  Pager (please use [email or web address] to page/text) Please use Epic Secure Chat for non-urgent communication (7AM-7PM)  If 7PM-7AM, please contact night-coverage [email or web address], 05/20/2022, 12:22 PM

## 2022-05-20 NOTE — Progress Notes (Signed)
Pt removed BiPAP mask twice during the night and POX immediately dropped to 61-66%. Upon putting the BiPAP mask on the pt, her POX quickly and steadily increases to 96%.

## 2022-05-20 NOTE — Telephone Encounter (Signed)
Pt scheduled w  Sharalyn Ink, NP for 06/09/22'@230'$  pm

## 2022-05-20 NOTE — Hospital Course (Addendum)
Abigail Castillo is a 71 y.o. female with medical history significant of breast cancer (2022); COPD on 2-3L home O2; and depression/anxiety presenting with SOB.  She was last admitted from 11/15-12/18 for COPD exacerbation, discharged to [facility].  Patient was sent in by her facility with a reported O2 sat down to 65%.  The patient has been on BIPAP and reports feeling tired but otherwise is feeling better at this time.         ER Course:  COPD - terrible baseline disease.  Came in with worsening dyspnea.  Given Epi, mild somnolence.  Placed on BIPAP.  Given Mag++, Solumedrol, Albuterol.  VBG with pCO2 80.7.  Likely prompted by RSV infection.     Assessment and Plan: Principal Problem:   COPD with acute exacerbation ([place]) Active Problems:   Schizoaffective disorder ([place])   Ductal carcinoma in situ (DCIS) of left breast   RSV (respiratory syncytial virus pneumonia)   Essential hypertension     Acute on chronic respiratory failure associated with a COPD exacerbation caused by RSV infection Stable- still on BiPAP  Blood pressure 130/71, pulse (!) 120, temperature 98 F (36.7 C), RR 36-20, weight 61.7 kg, SpO2 96 % on BiPAP 40%  ABG    Component Value Date/Time   PHART 7.339 (L) 02/05/2010 1540   PCO2ART 64.2 (HH) 02/05/2010 1540   PO2ART 107.0 (H) 02/05/2010 1540   HCO3 37.4 (H) 05/19/2022 1033   TCO2 39 (H) 05/19/2022 1033   O2SAT 99 05/19/2022 1033    -Patient's shortness of breath and productive cough are most likely caused by acute COPD exacerbation and RSV infection  -She was hypoxic to 65% despite home O2 and was placed on BIPAP with improvement  -She also had marked hypercapnia on presentation, improved on BIPAP  -Currently afebrile, normotensive,  -Chest x-ray is not consistent with pneumonia, WBC 20.9 >>14.4, lactic acid 1.0, Pro-Cal 0.14  -Nebulizers: scheduled Duoneb and prn albuterol >> switch to Xopenex/Atrovent due to tachycardia  -Will continue with  Solu-Medrol 80 mg IV BID -> Prednisone 40 mg PO daily,  -IV Cefepime -empiric treatment -- while she does have a viral infection, she also has severe underlying disease and significant leukocytosis   Pulmonary consulted:  Appreciate further evaluation recommendations -Continue aformoterol, Yupelri  -Coordinated care with TOC team/PT/OT/Nutrition/RT consults  -She remained stable on BiPAP currently ABG as needed   HTN -Continue amlodipine   Breast cancer -s/p lumpectomy in 06/2021 -Declined adjuvant radiation -Continue anastrozole   Schizoaffective d/o -Continue lorazepam, olanzapine

## 2022-05-20 NOTE — Progress Notes (Addendum)
Received pt for ED via stretcher by RN and RT.Pt is A&OX4. Pt is on BiPAP but in NAD. VS taken, place on telemetry monitor and POC reviewed with pt. Pt MAE. Bruises noted on pt's abd and BUE. Pt had CHG bath and a new purwick was place.

## 2022-05-21 DIAGNOSIS — J121 Respiratory syncytial virus pneumonia: Secondary | ICD-10-CM

## 2022-05-21 DIAGNOSIS — J441 Chronic obstructive pulmonary disease with (acute) exacerbation: Secondary | ICD-10-CM | POA: Diagnosis not present

## 2022-05-21 DIAGNOSIS — J9621 Acute and chronic respiratory failure with hypoxia: Secondary | ICD-10-CM | POA: Diagnosis not present

## 2022-05-21 DIAGNOSIS — F32A Depression, unspecified: Secondary | ICD-10-CM | POA: Diagnosis not present

## 2022-05-21 DIAGNOSIS — J9622 Acute and chronic respiratory failure with hypercapnia: Secondary | ICD-10-CM

## 2022-05-21 MED ORDER — SODIUM CHLORIDE 0.9 % IV SOLN: INTRAVENOUS | Status: DC | PRN

## 2022-05-21 MED ORDER — METHYLPREDNISOLONE SODIUM SUCC 125 MG IJ SOLR
120.0000 mg | INTRAMUSCULAR | Status: DC
  Administered 2022-05-21 – 2022-05-23 (×3): 120 mg via INTRAVENOUS
  Filled 2022-05-21 (×3): qty 2

## 2022-05-21 MED ORDER — PANTOPRAZOLE SODIUM 40 MG IV SOLR
40.0000 mg | Freq: Every day | INTRAVENOUS | Status: DC
  Administered 2022-05-21 – 2022-06-05 (×16): 40 mg via INTRAVENOUS
  Filled 2022-05-21 (×16): qty 10

## 2022-05-21 MED ORDER — LORAZEPAM 2 MG/ML IJ SOLN
1.0000 mg | Freq: Once | INTRAMUSCULAR | Status: DC | PRN
  Filled 2022-05-21: qty 1

## 2022-05-21 MED ORDER — FUROSEMIDE 10 MG/ML IJ SOLN
60.0000 mg | Freq: Once | INTRAMUSCULAR | Status: AC
  Administered 2022-05-21: 60 mg via INTRAVENOUS
  Filled 2022-05-21: qty 6

## 2022-05-21 MED ORDER — METHYLPREDNISOLONE SODIUM SUCC 40 MG IJ SOLR: 40.0000 mg | Freq: Three times a day (TID) | INTRAMUSCULAR | Status: DC

## 2022-05-21 MED ORDER — SODIUM CHLORIDE 0.9 % IV SOLN
500.0000 mg | INTRAVENOUS | Status: AC
  Administered 2022-05-21 – 2022-05-23 (×3): 500 mg via INTRAVENOUS
  Filled 2022-05-21 (×4): qty 5

## 2022-05-21 NOTE — Progress Notes (Signed)
Patient removed from [place] and placed on 15L HFNC to perform skin assessment and oral care. Only able to stay off bipap for 10mns before WOB increased and oxygen saturations dropped to 86%.

## 2022-05-21 NOTE — Progress Notes (Signed)
Patient removed from bipap and placed on 15L HFNC for oral care/skin assessment.  Abigail Castillo able to stay off bipap for 15 minutes this time.

## 2022-05-21 NOTE — Progress Notes (Signed)
PT Cancellation Note  Patient Details Name: Abigail Castillo MRN: [id number] DOB: 07-Apr-1951   Cancelled Treatment:    Reason Eval/Treat Not Completed: Medical issues which prohibited therapy (Patient continues to require BiPAP. PT will hold and follow up when medically appropriate.)  Minna Merritts, PT, MPT  Percell Locus 05/21/2022, 11:33 AM

## 2022-05-21 NOTE — Assessment & Plan Note (Addendum)
Resolved with diuretic

## 2022-05-21 NOTE — Plan of Care (Signed)

## 2022-05-21 NOTE — Assessment & Plan Note (Signed)
As evidenced by chronic lung disease, chronic respiratory failure, and moderate diffuse loss of subcutaneous muscle mass and fat.

## 2022-05-21 NOTE — Assessment & Plan Note (Signed)
See above

## 2022-05-21 NOTE — Assessment & Plan Note (Signed)
-   Continue steroids, increase dose - Add PPI IV - Continue scheduled bronchodilators - Continue PRN bronchodilators - Continue Rocephin - Add azithromycin - Continue ICS/LABA/LAMA

## 2022-05-21 NOTE — Assessment & Plan Note (Signed)
See comments 12/27 Worse today.  No improvement with both Lasix, addition of azithromycin, and increase of steroids.  ABG shows pH stable but CO2 up to 70s despite BiPAP and tachypnea.   - [place]

## 2022-05-21 NOTE — Assessment & Plan Note (Signed)
-   Continue Olanzapine - Continue PRN ativan

## 2022-05-21 NOTE — Progress Notes (Signed)
  Progress Note   Patient: Abigail Castillo BWI:[id number] DOB: 02-21-1951 DOA: 05/19/2022     2 DOS: the patient was seen and examined on 05/21/2022 at 8:08 AM      Brief hospital course: Mrs. Trieu is a 71 y.o. F with COPD and chronic respiratory failure on 2-3L home O2, depression/anxiety and hx BrCA, recently admitted for OCPD flare, discharged to SNF who returned due to SpO2 65%.  In the ER, RSV+, pCO2 80, started on BiPAP.         12/25: Admitted on BiPAP 12/26: Critical Care consulted, recommended continuing medical therapy 12/27: Still on BiPAP, Lasix and antibiotics added      Assessment and Plan: * Acute on chronic respiratory failure with hypoxia and hypercapnia (HCC) On 2-3L at baseline, here requiring BiPAP to keep SpO2 >88%.  Multifactorial from RSV, COPD.  Doubt PE.  May be some bacterial component, may be some pulmonary edema component.    RSV (respiratory syncytial virus pneumonia) - Give Lasix x1 - Continue bronchodilators as above  COPD with acute exacerbation (HCC) - Continue steroids, increase dose - Add PPI IV - Continue scheduled bronchodilators - Continue PRN bronchodilators - Continue Rocephin - Add azithromycin - Continue ICS/LABA/LAMA    Malnutrition of moderate degree As evidenced by chronic lung disease, chronic respiratory failure, and moderate diffuse loss of subcutaneous muscle mass and fat.  Essential hypertension Blood pressure controlled - Continue home amlodipine  Hyponatremia Due to COPD exacerbation.  Stable.  Asymptomatic. -IV Lasix  Ductal carcinoma in situ (DCIS) of left breast    Depression - Continue Olanzapine - Continue PRN ativan            Subjective: Patient is dyspneic.  She is tachypneic.  She feels chest tightness and anxiety.  No chest pain, no confusion, no fever.     Physical Exam: BP 131/75   Pulse (!) 116   Temp 98 F (36.7 C) (Axillary)   Resp (!) 28   Wt 60.1 kg   SpO2 95%    BMI 24.23 kg/m   Thin elderly female, lying in bed, appears to be uncomfortable, on BiPAP, tachypneic Tachycardic, regular, no murmurs, no peripheral edema, no JVD Respiratory rate increased, lung sounds extremely tight, scant wheezing auscultated, but poor air movement. Abdomen soft without tenderness palpation or guarding Oriented to person, place, and time, edentulous but speech is intelligible to the BiPAP, face symmetric, moves upper extremities with generalized weakness but symmetric strength     Data Reviewed: Basic metabolic panel shows sodium 132, no change Creatinine and potassium normal Lactic acid normal White blood cell count down to 14 K  Family Communication: None present    Disposition: Status is: Inpatient Severe respiratory failure requiring ongoing BiPAP, IV steroids, frequent bronchodilators, support for respirations.        Author: Edwin Dada, MD 05/21/2022 3:49 PM  For on call review [email or web address].

## 2022-05-21 NOTE — Assessment & Plan Note (Signed)
Blood pressure controlled - Continue home amlodipine

## 2022-05-21 NOTE — Progress Notes (Signed)
OT Cancellation Note  Patient Details Name: Abigail Castillo MRN: [id number] DOB: 1950/06/12   Cancelled Treatment:    Reason Eval/Treat Not Completed: Medical issues which prohibited therapy (pt still on BiPAP, will follow up for OT evaluation as appropriate.)  Renaye Rakers, OTD, OTR/L SecureChat Preferred Acute Rehab [phone]   Ulla Gallo 05/21/2022, 12:26 PM

## 2022-05-21 NOTE — Consult Note (Signed)
   [facility] Inpatient Consult   05/21/2022  Abigail Castillo Oct 09, 1950 [id number]  [place] [employer] [ACO] Patient: [employer] Medicare  Primary Care Provider:  [name], NP, [facility]  *Patient on droplet precautions for positive respiratory panel and need for BIPAP.  Patient was reviewed for readmission high risk score and recently from [facility].  If the patient goes to a [facility] affiliated facility then, patient can be followed by [facility] PAC RN with traditional Medicare and approved Medicare Advantage plans.    Plan:  Will continue to follow for disposition needs.  Notify [facility] PAC RN who can follow for any known or needs for transitional care needs for returning to post facility care coordination needs to return to community.  For questions or referrals, please contact:   Natividad Brood, RN BSN [place]  [phone] business mobile phone Toll free office [phone]  *[place]  [phone] Fax number: [phone] [email or web address]

## 2022-05-22 ENCOUNTER — Inpatient Hospital Stay (HOSPITAL_COMMUNITY): Payer: 59

## 2022-05-22 DIAGNOSIS — R609 Edema, unspecified: Secondary | ICD-10-CM

## 2022-05-22 DIAGNOSIS — J121 Respiratory syncytial virus pneumonia: Secondary | ICD-10-CM

## 2022-05-22 DIAGNOSIS — I10 Essential (primary) hypertension: Secondary | ICD-10-CM

## 2022-05-22 DIAGNOSIS — E871 Hypo-osmolality and hyponatremia: Secondary | ICD-10-CM

## 2022-05-22 DIAGNOSIS — F32A Depression, unspecified: Secondary | ICD-10-CM

## 2022-05-22 DIAGNOSIS — J9621 Acute and chronic respiratory failure with hypoxia: Principal | ICD-10-CM

## 2022-05-22 DIAGNOSIS — J9622 Acute and chronic respiratory failure with hypercapnia: Secondary | ICD-10-CM

## 2022-05-22 DIAGNOSIS — J441 Chronic obstructive pulmonary disease with (acute) exacerbation: Secondary | ICD-10-CM

## 2022-05-22 LAB — BLOOD GAS, ARTERIAL
Acid-Base Excess: 19.9 mmol/L — ABNORMAL HIGH (ref 0.0–2.0)
Bicarbonate: 48.6 mmol/L — ABNORMAL HIGH (ref 20.0–28.0)
O2 Saturation: 89.1 %
Patient temperature: 36.5
pCO2 arterial: 73 mmHg (ref 32–48)
pH, Arterial: 7.43 (ref 7.35–7.45)
pO2, Arterial: 56 mmHg — ABNORMAL LOW (ref 83–108)

## 2022-05-22 LAB — BASIC METABOLIC PANEL
Anion gap: 11 (ref 5–15)
BUN: 22 mg/dL (ref 8–23)
CO2: 39 mmol/L — ABNORMAL HIGH (ref 22–32)
Calcium: 8.8 mg/dL — ABNORMAL LOW (ref 8.9–10.3)
Chloride: 91 mmol/L — ABNORMAL LOW (ref 98–111)
Creatinine, Ser: 0.48 mg/dL (ref 0.44–1.00)
GFR, Estimated: >60 mL/min (ref >60)
Glucose, Bld: 142 mg/dL — ABNORMAL HIGH (ref 70–99)
Potassium: 4.1 mmol/L (ref 3.5–5.1)
Sodium: 141 mmol/L (ref 135–145)

## 2022-05-22 LAB — CBC
HCT: 39.7 % (ref 36.0–46.0)
Hemoglobin: 12.4 g/dL (ref 12.0–15.0)
MCH: 30.9 pg (ref 26.0–34.0)
MCHC: 31.2 g/dL (ref 30.0–36.0)
MCV: 99 fL (ref 80.0–100.0)
Platelets: 156 10*3/uL (ref 150–400)
RBC: 4.01 MIL/uL (ref 3.87–5.11)
RDW: 13.3 % (ref 11.5–15.5)
WBC: 6.1 10*3/uL (ref 4.0–10.5)
nRBC: 0 % (ref 0.0–0.2)

## 2022-05-22 MED ORDER — FUROSEMIDE 10 MG/ML IJ SOLN
40.0000 mg | Freq: Four times a day (QID) | INTRAMUSCULAR | Status: AC
  Administered 2022-05-22 (×2): 40 mg via INTRAVENOUS
  Filled 2022-05-22 (×2): qty 4

## 2022-05-22 MED ORDER — DEXMEDETOMIDINE HCL IN NACL 400 MCG/100ML IV SOLN
0.0000 ug/kg/h | INTRAVENOUS | Status: DC
  Administered 2022-05-22: 0.4 ug/kg/h via INTRAVENOUS
  Administered 2022-05-23: 0.3 ug/kg/h via INTRAVENOUS
  Administered 2022-05-23: 0.4 ug/kg/h via INTRAVENOUS
  Administered 2022-05-24: 0.3 ug/kg/h via INTRAVENOUS
  Administered 2022-05-25: 0.4 ug/kg/h via INTRAVENOUS
  Filled 2022-05-22 (×5): qty 100

## 2022-05-22 MED ORDER — ALBUMIN HUMAN 25 % IV SOLN
25.0000 g | Freq: Four times a day (QID) | INTRAVENOUS | Status: AC
  Administered 2022-05-22: 12.5 g via INTRAVENOUS
  Administered 2022-05-22 – 2022-05-23 (×3): 25 g via INTRAVENOUS
  Filled 2022-05-22 (×4): qty 100

## 2022-05-22 MED ORDER — LORAZEPAM 2 MG/ML IJ SOLN
0.5000 mg | Freq: Four times a day (QID) | INTRAMUSCULAR | Status: DC | PRN
  Administered 2022-05-22: 0.5 mg via INTRAVENOUS
  Filled 2022-05-22: qty 1

## 2022-05-22 MED ORDER — MORPHINE SULFATE (PF) 2 MG/ML IV SOLN
1.0000 mg | INTRAVENOUS | Status: DC | PRN
  Administered 2022-05-22: 1 mg via INTRAVENOUS
  Filled 2022-05-22: qty 1

## 2022-05-22 NOTE — Progress Notes (Signed)
PT Cancellation and Discharge Note  Patient Details Name: Abigail Castillo MRN: [id number] DOB: [birth year]   Cancelled Treatment:    Reason Eval/Treat Not Completed: (P) Medical issues which prohibited therapy (Pt continues to require constant BiPAP) Pt has had medical issues prohibiting Evaluation for the last 3 days. PT will discharge from caseload. Please reorder PT when pt is medically stable.  Eain Mullendore B. Migdalia Dk PT, DPT Acute Rehabilitation Services Please use secure chat or  Call Office [phone]    [place] 05/22/2022, 10:01 AM

## 2022-05-22 NOTE — Progress Notes (Signed)
Pt placed on BIPAP for night rest.  Tolerating well. 

## 2022-05-22 NOTE — Progress Notes (Signed)
Pt unable to perform NIF and VC at this time due to not tolerating coming off BIPAP.

## 2022-05-22 NOTE — Consult Note (Signed)
NAME:  Abigail Castillo, MRN:  [id number], DOB:  [birth year], LOS: 1 ADMISSION DATE:  05/19/2022, CONSULTATION DATE:  05/20/22 REFERRING MD:  Roger Shelter CHIEF COMPLAINT:  Dyspnea   History of Present Illness:  Abigail Castillo is a 71 y.o. female who has a PMH as below including but not limited to COPD on chronic 2-3L O2 and '10mg'$  Prednisone, OSA on nocturnal BiPAP, and who is followed by Dr. Lamonte Sakai in our office. She had recent admission 11/15 through 12/18 for AECOPD and was then discharged to [facility].  She was recently seen as an outpatient by Dr. Lamonte Sakai on 12/22 and was prescribed a slow prednisone taper with instructions to drop to '10mg'$  and continue until her next follow up appointment.  She presented back to [facility] ED 12/25 with dyspnea and hypoxia down to 65%. She was found to have hypoxic and hypercapnic respiratory failure and her RSV swab returned positive. She was placed on BiPAP and was admitted by [facility].  On 12/26, PCCM was consulted for assistance with BiPAP and COPD management.  Pertinent  Medical History:  has History of tobacco abuse; Depression; COPD (chronic obstructive pulmonary disease) ([place]); Osteoporosis; HOARSENESS, CHRONIC; HYPERTRIGLYCERIDEMIA; Memory change; B12 deficiency; Personal history of colonic polyps; Encounter for routine gynecological examination; Mild hyperlipidemia; Vitamin D deficiency; Esophageal stricture; GERD (gastroesophageal reflux disease); Schizoaffective disorder ([place]); Allergic rhinitis; Chronic respiratory failure ([place]); Acute on chronic respiratory failure with hypoxia ([place]); Elevated blood pressure reading without diagnosis of hypertension; Hepatic steatosis; Ductal carcinoma in situ (DCIS) of left breast; On home oxygen therapy; Hyponatremia; Bronchomalacia; Localized edema; COPD exacerbation ([place]); Tracheobronchomalacia; Acute on chronic respiratory failure with hypoxia and hypercapnia (HCC); COPD with acute exacerbation ([place]); RSV (respiratory syncytial  virus pneumonia); and Essential hypertension on their problem list.  Significant Hospital Events: Including procedures, antibiotic start and stop dates in addition to other pertinent events   12/25 admit. 12/26 PCCM consult.  Interim History / Subjective:  Increased work of breathing unable to come off BiPAP transferred to intensive care unit  Objective:  Blood pressure 130/71, pulse (!) 120, temperature 98 F (36.7 C), temperature source Axillary, resp. rate 20, weight 61.7 kg, SpO2 96 %.    Vent Mode: BIPAP;PCV FiO2 (%):  [40 %] 40 % Set Rate:  [10 bmp] 10 bmp PEEP:  [5 cmH20] 5 cmH20   Intake/Output Summary (Last 24 hours) at 05/20/2022 1335 Last data filed at 05/19/2022 2256 Gross per 24 hour  Intake 487.51 ml  Output --  Net 487.51 ml   Filed Weights   05/20/22 0030  Weight: 61.7 kg    Examination: General: Obese female who is awake and anxious HEENT: MM pink/moist currently on full facemask in place Neuro: Somewhat dull affect but answers questions CV: Tachycardic to 140s PULM: Respiratory rate 34 despite being on noninvasive mechanical ventilatory support FiO2 is 40% she is unable to come off BiPAP at all  GI: soft, bsx4 active  GU: Amber urine Extremities: warm/dry, 1+ edema  Skin: no rashes or lesions   Labs/imaging personally reviewed:  CXR 12/26  >emphysema.  Assessment & Plan:   AECOPD - 2/2 RSV +/- bacterial component. Acute on chronic hypoxic and hypercarbic respiratory failure. "Acutely to bedside 05/22/2022 Increased work of breathing unable to come off BiPAP for even 1 minute She is RSV positive and is wearing a. Transferred to the intensive care unit Continue steroids and antimicrobial therapy Continue bronchodilators   Hx OSA. Currently on BiPAP only nocturnally once improved  Best practice (  evaluated daily):  Per primary team.  Labs   CBC: Recent Labs  Lab 05/19/22 0655 05/19/22 0701 05/19/22 0835 05/19/22 1033  05/20/22 0105  WBC 20.9*  --   --   --  14.4*  HGB 13.9 15.3* 15.0 14.3 13.2  HCT 42.6 45.0 44.0 42.0 38.7  MCV 96.2  --   --   --  95.3  PLT 271  --   --   --  929    Basic Metabolic Panel: Recent Labs  Lab 05/19/22 0655 05/19/22 0701 05/19/22 0835 05/19/22 1033 05/20/22 0105  NA 129* 125* 125* 122* 132*  K 3.6 3.5 3.1* 5.3* 4.0  CL 85* 83*  --   --  91*  CO2 28  --   --   --  30  GLUCOSE 223* 226*  --   --  201*  BUN 10 11  --   --  8  CREATININE 0.71 0.70  --   --  0.50  CALCIUM 8.7*  --   --   --  8.6*   GFR: Estimated Creatinine Clearance: 56.5 mL/min (by C-G formula based on SCr of 0.5 mg/dL). Recent Labs  Lab 05/19/22 0655 05/20/22 0105 05/20/22 0835  PROCALCITON  --  0.14  --   WBC 20.9* 14.4*  --   LATICACIDVEN  --   --  1.0    Liver Function Tests: Recent Labs  Lab 05/19/22 0655  AST 27  ALT 42  ALKPHOS 66  BILITOT 2.2*  PROT 5.9*  ALBUMIN 3.9   No results for input(s): "LIPASE", "AMYLASE" in the last 168 hours. No results for input(s): "AMMONIA" in the last 168 hours.  ABG    Component Value Date/Time   PHART 7.339 (L) 02/05/2010 1540   PCO2ART 64.2 (HH) 02/05/2010 1540   PO2ART 107.0 (H) 02/05/2010 1540   HCO3 37.4 (H) 05/19/2022 1033   TCO2 39 (H) 05/19/2022 1033   O2SAT 99 05/19/2022 1033     Coagulation Profile: No results for input(s): "INR", "PROTIME" in the last 168 hours.  Cardiac Enzymes: No results for input(s): "CKTOTAL", "CKMB", "CKMBINDEX", "TROPONINI" in the last 168 hours.  HbA1C: Hgb A1c MFr Bld  Date/Time Value Ref Range Status  10/28/2021 08:41 AM 5.3 4.6 - 6.5 % Final    Comment:    Glycemic Control Guidelines for People with Diabetes:Non Diabetic:  <6%Goal of Therapy: <7%Additional Action Suggested:  >8%   02/03/2014 10:23 AM 5.4 4.6 - 6.5 % Final    Comment:    Glycemic Control Guidelines for People with Diabetes:Non Diabetic:  <6%Goal of Therapy: <7%Additional Action Suggested:  >8%     CBG: No results  for input(s): "GLUCAP" in the last 168 hours.   Critical care time: 30 min.   Richardson Landry Sherl Yzaguirre ACNP Acute Care Nurse Practitioner [place] Please consult Amion 05/22/2022, 9:45 AM

## 2022-05-22 NOTE — Progress Notes (Signed)
OT Cancellation Note  Patient Details Name: Abigail Castillo MRN: [id number] DOB: Oct 12, 1950   Cancelled Treatment:    Reason Eval/Treat Not Completed: Medical issues which prohibited therapy. Pt with worsening respiratory status and bipap dependence x 3. Transferred to ICU. Will sign off and await new orders.   [name] 05/22/2022, 11:19 AM Cleta Alberts, OTR/L Acute Rehabilitation Services Office: [phone]

## 2022-05-22 NOTE — Progress Notes (Signed)
Lower extremity venous duplex has been completed.   Preliminary results in CV Proc.   Jinny Blossom Ceanna Wareing 05/22/2022 3:25 PM

## 2022-05-22 NOTE — Progress Notes (Signed)
Pt is unable to perform NIF and VC

## 2022-05-22 NOTE — Progress Notes (Signed)
Pt placed on HHFNC 45L/100% per MD order. Pt is tolerating well at this time. RT to continue to monitor.

## 2022-05-22 NOTE — Plan of Care (Signed)

## 2022-05-22 NOTE — Progress Notes (Addendum)
EOS: during bipap mask removal for oral care, tolerated 5-10 minutes on 15L HFNC before sharp desats. Bipap continues.  7357: attempted to sit at EOB, unable to tolerate under a minute - increased HR to 120's, increased WOB.

## 2022-05-22 NOTE — Progress Notes (Signed)
Pt transported to [place] [facility] on BIPAP. No complications noted.

## 2022-05-22 NOTE — Progress Notes (Signed)
  Progress Note   Patient: Abigail Castillo TFT:[id number] DOB: 1950-07-10 DOA: 05/19/2022     3 DOS: the patient was seen and examined on 05/22/2022 at 8:22 AM      Brief hospital course: Mrs. [place] is a 71 y.o. F with COPD and chronic respiratory failure on 2-3L home O2, depression/anxiety and hx BrCA, recently admitted for OCPD flare, discharged to SNF who returned due to SpO2 65%.  In the ER, RSV+, pCO2 80, started on BiPAP.         12/25: Admitted on BiPAP 12/26: Critical Care consulted, recommended continuing medical therapy 12/27: Still on BiPAP, Lasix and antibiotics added 12/28: No improvement, transferred to ICU     Assessment and Plan: * Acute on chronic respiratory failure with hypoxia and hypercapnia ([place]) See comments 12/27 Worse today.  No improvement with both Lasix, addition of azithromycin, and increase of steroids.  ABG shows pH stable but CO2 up to 70s despite BiPAP and tachypnea.   - Reconsult Critical Care    RSV (respiratory syncytial virus pneumonia) See above  COPD with acute exacerbation (HCC) - Continue steroids, PPI, bronchodilators, Rocephin, azithromycin - Continue ICS/LABA/LAMA - Consult Critical Care    Malnutrition of moderate degree As evidenced by chronic lung disease, chronic respiratory failure, and moderate diffuse loss of subcutaneous muscle mass and fat.  Essential hypertension Blood pressure controlled - Continue home amlodipine  Hyponatremia Resolved with diuretic  Ductal carcinoma in situ (DCIS) of left breast    Depression - Continue Olanzapine - Continue PRN ativan            Subjective: Patient is tachypneic, feels chest tightness, malaise, no improvement with maximal therapy, short of breath, very dry mouth.     Physical Exam: BP 128/89 (BP Location: Right Arm)   Pulse (!) 127   Temp 99 F (37.2 C) (Axillary)   Resp 20   Ht _0  (1.575 m)   Wt 59.4 kg   SpO2 95%   BMI 23.95 kg/m   Thin  elderly female, lying in bed, edentulous, appears uncomfortable on BiPAP, tachypneic Tachycardic, severely, rhythm regular, no murmurs appreciated, no peripheral edema Tachypneic, barrel chested, lung sounds extremely diminished minimal air movement Abdomen fibromas to palpation Moves upper extremities with generalized weakness, face appears symmetric, makes eye contact and tries to answer but has difficulty due to shortness of breath    Data Reviewed: Discussed with critical care Basic metabolic panel shows resolved hyponatremia, stable renal function Blood counts normal  Family Communication: Daughter by phone    Disposition: Status is: Inpatient The patient presented with respiratory failure due to RSV in the setting of severe baseline COPD with FEV1 less than 20% 5 years ago  She has worsened despite escalating therapy, I consulted critical care and they recommend transfer to the ICU        Author: Edwin Dada, MD 05/22/2022 11:48 AM  For on call review [email or web address].

## 2022-05-22 NOTE — Plan of Care (Signed)
  Problem: Education: Goal: Knowledge of disease or condition will improve Outcome: Not Progressing Goal: Knowledge of the prescribed therapeutic regimen will improve Outcome: Not Progressing Goal: Individualized Educational Video(s) Outcome: Not Progressing   Problem: Activity: Goal: Ability to tolerate increased activity will improve Outcome: Not Progressing Goal: Will verbalize the importance of balancing activity with adequate rest periods Outcome: Not Progressing   Problem: Respiratory: Goal: Ability to maintain a clear airway will improve Outcome: Not Progressing Goal: Levels of oxygenation will improve Outcome: Not Progressing Goal: Ability to maintain adequate ventilation will improve Outcome: Not Progressing   Problem: Education: Goal: Knowledge of General Education information will improve Description: Including pain rating scale, medication(s)/side effects and non-pharmacologic comfort measures Outcome: Not Progressing   Problem: Health Behavior/Discharge Planning: Goal: Ability to manage health-related needs will improve Outcome: Not Progressing   Problem: Clinical Measurements: Goal: Ability to maintain clinical measurements within normal limits will improve Outcome: Not Progressing Goal: Will remain free from infection Outcome: Not Progressing Goal: Diagnostic test results will improve Outcome: Not Progressing Goal: Respiratory complications will improve Outcome: Not Progressing Goal: Cardiovascular complication will be avoided Outcome: Not Progressing   Problem: Activity: Goal: Risk for activity intolerance will decrease Outcome: Not Progressing   Problem: Nutrition: Goal: Adequate nutrition will be maintained Outcome: Not Progressing   Problem: Coping: Goal: Level of anxiety will decrease Outcome: Not Progressing   Problem: Elimination: Goal: Will not experience complications related to bowel motility Outcome: Not Progressing Goal: Will not  experience complications related to urinary retention Outcome: Not Progressing   Problem: Pain Managment: Goal: General experience of comfort will improve Outcome: Not Progressing   Problem: Safety: Goal: Ability to remain free from injury will improve Outcome: Not Progressing   Problem: Skin Integrity: Goal: Risk for impaired skin integrity will decrease Outcome: Not Progressing   

## 2022-05-23 DIAGNOSIS — J9621 Acute and chronic respiratory failure with hypoxia: Secondary | ICD-10-CM | POA: Diagnosis not present

## 2022-05-23 DIAGNOSIS — J9622 Acute and chronic respiratory failure with hypercapnia: Secondary | ICD-10-CM | POA: Diagnosis not present

## 2022-05-23 LAB — BASIC METABOLIC PANEL
Anion gap: 12 (ref 5–15)
BUN: 31 mg/dL — ABNORMAL HIGH (ref 8–23)
CO2: 44 mmol/L — ABNORMAL HIGH (ref 22–32)
Calcium: 9 mg/dL (ref 8.9–10.3)
Chloride: 85 mmol/L — ABNORMAL LOW (ref 98–111)
Creatinine, Ser: 0.56 mg/dL (ref 0.44–1.00)
GFR, Estimated: >60 mL/min (ref >60)
Glucose, Bld: 164 mg/dL — ABNORMAL HIGH (ref 70–99)
Potassium: 3.5 mmol/L (ref 3.5–5.1)
Sodium: 141 mmol/L (ref 135–145)

## 2022-05-23 LAB — MRSA NEXT GEN BY PCR, NASAL: MRSA by PCR Next Gen: NEGATIVE — AB

## 2022-05-23 MED ORDER — POTASSIUM CHLORIDE CRYS ER 20 MEQ PO TBCR
40.0000 meq | EXTENDED_RELEASE_TABLET | Freq: Once | ORAL | Status: AC
  Administered 2022-05-23: 40 meq via ORAL
  Filled 2022-05-23: qty 2

## 2022-05-23 MED ORDER — CHLORHEXIDINE GLUCONATE CLOTH 2 % EX PADS
6.0000 | MEDICATED_PAD | Freq: Every day | CUTANEOUS | Status: DC
  Administered 2022-05-23 – 2022-06-28 (×42): 6 via TOPICAL

## 2022-05-23 MED ORDER — POTASSIUM CHLORIDE 10 MEQ/100ML IV SOLN
10.0000 meq | INTRAVENOUS | Status: AC
  Administered 2022-05-23 (×4): 10 meq via INTRAVENOUS
  Filled 2022-05-23 (×4): qty 100

## 2022-05-23 MED ORDER — POTASSIUM CHLORIDE CRYS ER 20 MEQ PO TBCR: 40.0000 meq | EXTENDED_RELEASE_TABLET | Freq: Once | ORAL | Status: DC

## 2022-05-23 MED ORDER — IPRATROPIUM-ALBUTEROL 0.5-2.5 (3) MG/3ML IN SOLN: 3.0000 mL | RESPIRATORY_TRACT | Status: DC

## 2022-05-23 MED ORDER — FUROSEMIDE 10 MG/ML IJ SOLN
40.0000 mg | Freq: Once | INTRAMUSCULAR | Status: AC
  Administered 2022-05-23: 40 mg via INTRAVENOUS
  Filled 2022-05-23: qty 4

## 2022-05-23 MED ORDER — ALBUTEROL (5 MG/ML) CONTINUOUS INHALATION SOLN
10.0000 mg/h | INHALATION_SOLUTION | RESPIRATORY_TRACT | Status: DC
  Administered 2022-05-23: 10 mg/h via RESPIRATORY_TRACT
  Filled 2022-05-23 (×2): qty 20

## 2022-05-23 MED ORDER — ORAL CARE MOUTH RINSE: 15.0000 mL | OROMUCOSAL | Status: DC | PRN

## 2022-05-23 MED ORDER — ORAL CARE MOUTH RINSE
15.0000 mL | OROMUCOSAL | Status: DC
  Administered 2022-05-23 – 2022-05-28 (×20): 15 mL via OROMUCOSAL

## 2022-05-23 MED ORDER — WHITE PETROLATUM EX OINT
TOPICAL_OINTMENT | CUTANEOUS | Status: DC | PRN
  Filled 2022-05-23: qty 28.35

## 2022-05-23 NOTE — Progress Notes (Signed)
Critical care attending attestation note:  Patient seen and examined and relevant ancillary tests reviewed.  I agree with the assessment and plan of care as outlined by Abigail Dom, NP.   Synopsis of assessment and plan:  71 year old woman who is critically ill due to hypoxic hypercarbic respiratory failure requiring BiPAP due to COPD exacerbation from RSV infection on background of severe COPD.  He was recently discharged in mid December from an exude exacerbation and ended up in a rehabilitation facility.  On examination she is resting calmly in bed with little accessory muscle use although she feels dyspneic.  She is on a small dose of Precedex for comfort.  She is taking adequate tidal volumes on BiPAP with minimal leak.  She is barrel chested with very distant breath sounds.  Faint wheezing is barely audible predominant on the left side.  Air no signs of fluid overload.  -Severe COPD exacerbation caused by RSV.  Severe underlying lung disease makes prognosis guarded. -Continue steroids and bronchodilators. -Will try continues nebulization -Remains tenuous and may require intubation.  Family is aware that she may not separate from mechanical ventilation but appears to be willing to take that chance at the moment.  CRITICAL CARE Performed by: Kipp Brood   Total critical care time: 35 minutes  Critical care time was exclusive of separately billable procedures and treating other patients.  Critical care was necessary to treat or prevent imminent or life-threatening deterioration.  Critical care was time spent personally by me on the following activities: development of treatment plan with patient and/or surrogate as well as nursing, discussions with consultants, evaluation of patient's response to treatment, examination of patient, obtaining history from patient or surrogate, ordering and performing treatments and interventions, ordering and review of laboratory studies, ordering and  review of radiographic studies, pulse oximetry, re-evaluation of patient's condition and participation in multidisciplinary rounds.  Kipp Brood, MD [facility] ICU Physician [place]  Pager: [phone] Mobile: [phone] After hours: [phone].  05/23/2022, 4:43 PM

## 2022-05-23 NOTE — Progress Notes (Signed)
Pt VC 500 ml, pt weak and has severe COPD. NIF -30 good pt effort.

## 2022-05-23 NOTE — IPAL (Signed)
  Interdisciplinary Goals of Care Family Meeting   Date carried out:: 05/23/2022  Location of the meeting: Bedside  Member's involved: Nurse Practitioner, Bedside Registered Nurse, and Family Member or next of kin  Durable Power of Attorney or acting medical decision maker: the patient and her daughter candy     Discussion: We discussed goals of care for [employer] .  Including BIPAP, progression to mechanical ventilation, prolonged vent wean and trach   Code status: Full Code  Disposition: Continue current acute care pt would even commit to trach and LTAC if there was a chance to improve.  Time spent for the meeting: 32 min  Clementeen Graham 05/23/2022, 3:44 PM

## 2022-05-23 NOTE — Progress Notes (Signed)
NAME:  TONI Castillo, MRN:  [id number], DOB:  05-26-1951, LOS: 1 ADMISSION DATE:  05/19/2022, CONSULTATION DATE:  05/20/22 REFERRING MD:  Roger Shelter CHIEF COMPLAINT:  Dyspnea   History of Present Illness:  Abigail Castillo is a 71 y.o. female who has a PMH as below including but not limited to COPD on chronic 2-3L O2 and '10mg'$  Prednisone, OSA on nocturnal BiPAP, and who is followed by Dr. Lamonte Sakai in our office. She had recent admission 11/15 through 12/18 for AECOPD and was then discharged to [facility].  She was recently seen as an outpatient by Dr. Lamonte Sakai on 12/22 and was prescribed a slow prednisone taper with instructions to drop to '10mg'$  and continue until her next follow up appointment.  She presented back to [facility] ED 12/25 with dyspnea and hypoxia down to 65%. She was found to have hypoxic and hypercapnic respiratory failure and her RSV swab returned positive. She was placed on BiPAP and was admitted by [facility].  On 12/26, PCCM was consulted for assistance with BiPAP and COPD management.  Pertinent  Medical History:  has History of tobacco abuse; Depression; COPD (chronic obstructive pulmonary disease) ([place]); Osteoporosis; HOARSENESS, CHRONIC; HYPERTRIGLYCERIDEMIA; Memory change; B12 deficiency; Personal history of colonic polyps; Encounter for routine gynecological examination; Mild hyperlipidemia; Vitamin D deficiency; Esophageal stricture; GERD (gastroesophageal reflux disease); Schizoaffective disorder ([place]); Allergic rhinitis; Chronic respiratory failure ([place]); Acute on chronic respiratory failure with hypoxia ([place]); Elevated blood pressure reading without diagnosis of hypertension; Hepatic steatosis; Ductal carcinoma in situ (DCIS) of left breast; On home oxygen therapy; Hyponatremia; Bronchomalacia; Localized edema; COPD exacerbation ([place]); Tracheobronchomalacia; Acute on chronic respiratory failure with hypoxia and hypercapnia (HCC); COPD with acute exacerbation ([place]); RSV (respiratory syncytial  virus pneumonia); and Essential hypertension on their problem list.  Significant Hospital Events: Including procedures, antibiotic start and stop dates in addition to other pertinent events   12/25 admit. 12/26 PCCM consult.  Interim History / Subjective:  Still on NIPPV.   Objective:  Blood pressure 130/71, pulse (!) 120, temperature 98 F (36.7 C), temperature source Axillary, resp. rate 20, weight 61.7 kg, SpO2 96 %.    Vent Mode: BIPAP;PCV FiO2 (%):  [40 %] 40 % Set Rate:  [10 bmp] 10 bmp PEEP:  [5 cmH20] 5 cmH20   Intake/Output Summary (Last 24 hours) at 05/20/2022 1335 Last data filed at 05/19/2022 2256 Gross per 24 hour  Intake 487.51 ml  Output --  Net 487.51 ml   Filed Weights   05/20/22 0030  Weight: 61.7 kg    Examination: General 71 year old female resting on NIPPV still has sig WOB HENT face mask in place Pulm decreased throughout w/ faint exp wheeze Card rrr Abd soft Ext warm  Neuro awake. Anxious no distress   Labs/imaging personally reviewed:  CXR 12/26  >emphysema.  Assessment & Plan:   AECOPD - 2/2 RSV +/- bacterial component. Acute on chronic hypoxic and hypercarbic respiratory failure. -still very tight w/ poor air movement  Plan Cont solumedrol '120mg'$ /d Cont scheduled brovana/budesonide and yupelri Add scheduled duoneb Try heated high flow Day 3 of 3 azith  Day 4 of 7 ctx Repeat chem, might consider another dose of diuresis  Hx OSA. Plan BIPAP HS and PRN for now   Best practice (evaluated daily):   Best Practice (right click and "Reselect all SmartList Selections" daily)   Diet/type: NPO w/ oral meds DVT prophylaxis: LMWH GI prophylaxis: PPI Lines: N/A Foley:  Yes, and it is still needed Code Status:  full code Last date of multidisciplinary goals of care discussion [pending]       Critical care time: 32 min   [name] ACNP-BC [place] Pager # [phone] OR # [phone] if no  answer

## 2022-05-23 NOTE — Progress Notes (Signed)
RT NOTE: RT had placed patient on [place] at 9:35 this AM per CCM. RT at bedside to check on patient. RT found patient with increased WOB, tripoding, and sats dropping to 70. RT placed patient back on bipap with sats increasing to 99% and WOB improving. Vitals are stable. RT will continue to monitor.

## 2022-05-23 NOTE — Progress Notes (Signed)
RT NOTE: NIF/VC unable to be completed at this time as patient is on bipap and was not able to tolerate HHFNC at this time.

## 2022-05-24 ENCOUNTER — Inpatient Hospital Stay (HOSPITAL_COMMUNITY): Payer: 59

## 2022-05-24 DIAGNOSIS — J9621 Acute and chronic respiratory failure with hypoxia: Secondary | ICD-10-CM | POA: Diagnosis not present

## 2022-05-24 DIAGNOSIS — J9622 Acute and chronic respiratory failure with hypercapnia: Secondary | ICD-10-CM | POA: Diagnosis not present

## 2022-05-24 LAB — BASIC METABOLIC PANEL
Anion gap: 10 (ref 5–15)
BUN: 31 mg/dL — ABNORMAL HIGH (ref 8–23)
CO2: 44 mmol/L — ABNORMAL HIGH (ref 22–32)
Calcium: 9.7 mg/dL (ref 8.9–10.3)
Chloride: 91 mmol/L — ABNORMAL LOW (ref 98–111)
Creatinine, Ser: 0.54 mg/dL (ref 0.44–1.00)
GFR, Estimated: >60 mL/min (ref >60)
Glucose, Bld: 163 mg/dL — ABNORMAL HIGH (ref 70–99)
Potassium: 4.5 mmol/L (ref 3.5–5.1)
Sodium: 145 mmol/L (ref 135–145)

## 2022-05-24 LAB — CBC
HCT: 34.2 % — ABNORMAL LOW (ref 36.0–46.0)
Hemoglobin: 10.7 g/dL — ABNORMAL LOW (ref 12.0–15.0)
MCH: 31.6 pg (ref 26.0–34.0)
MCHC: 31.3 g/dL (ref 30.0–36.0)
MCV: 100.9 fL — ABNORMAL HIGH (ref 80.0–100.0)
Platelets: 130 10*3/uL — ABNORMAL LOW (ref 150–400)
RBC: 3.39 MIL/uL — ABNORMAL LOW (ref 3.87–5.11)
RDW: 13.2 % (ref 11.5–15.5)
WBC: 5.2 10*3/uL (ref 4.0–10.5)
nRBC: 0 % (ref 0.0–0.2)

## 2022-05-24 MED ORDER — METHYLPREDNISOLONE SODIUM SUCC 125 MG IJ SOLR
80.0000 mg | INTRAMUSCULAR | Status: AC
  Administered 2022-05-24 – 2022-05-26 (×3): 80 mg via INTRAVENOUS
  Filled 2022-05-24 (×3): qty 2

## 2022-05-24 MED ORDER — METHYLPREDNISOLONE SODIUM SUCC 40 MG IJ SOLR: 40.0000 mg | INTRAMUSCULAR | Status: DC

## 2022-05-24 MED ORDER — PREDNISONE 20 MG PO TABS: 10.0000 mg | ORAL_TABLET | Freq: Every day | ORAL | Status: DC

## 2022-05-24 MED ORDER — PREDNISONE 20 MG PO TABS: 20.0000 mg | ORAL_TABLET | Freq: Every day | ORAL | Status: DC

## 2022-05-24 NOTE — Progress Notes (Signed)
RT took patient off bipap and placed patient on [place] at 40L 50%. Patient tolerating well at this time. RN at bedside. RT will continue to monitor.

## 2022-05-24 NOTE — Progress Notes (Signed)
NAME:  Abigail Castillo, MRN:  [id number], DOB:  1950-11-22, LOS: 5 ADMISSION DATE:  05/19/2022, CONSULTATION DATE:  05/20/22 REFERRING MD:  Roger Shelter CHIEF COMPLAINT:  Dyspnea   History of Present Illness:  Abigail Castillo is a 71 y.o. female who has a PMH as below including but not limited to COPD on chronic 2-3L O2 and '10mg'$  Prednisone, OSA on nocturnal BiPAP, and who is followed by Dr. Lamonte Sakai in our office. She had recent admission 11/15 through 12/18 for AECOPD and was then discharged to [facility].  She was recently seen as an outpatient by Dr. Lamonte Sakai on 12/22 and was prescribed a slow prednisone taper with instructions to drop to '10mg'$  and continue until her next follow up appointment.  She presented back to [facility] ED 12/25 with dyspnea and hypoxia down to 65%. She was found to have hypoxic and hypercapnic respiratory failure and her RSV swab returned positive. She was placed on BiPAP and was admitted by [facility].  On 12/26, PCCM was consulted for assistance with BiPAP and COPD management.  Pertinent  Medical History:  has History of tobacco abuse; Depression; COPD (chronic obstructive pulmonary disease) ([place]); Osteoporosis; HOARSENESS, CHRONIC; HYPERTRIGLYCERIDEMIA; Memory change; B12 deficiency; Personal history of colonic polyps; Encounter for routine gynecological examination; Mild hyperlipidemia; Vitamin D deficiency; Esophageal stricture; GERD (gastroesophageal reflux disease); Schizoaffective disorder ([place]); Allergic rhinitis; Chronic respiratory failure ([place]); Acute on chronic respiratory failure with hypoxia ([place]); Elevated blood pressure reading without diagnosis of hypertension; Hepatic steatosis; Ductal carcinoma in situ (DCIS) of left breast; On home oxygen therapy; Hyponatremia; Bronchomalacia; Localized edema; COPD exacerbation ([place]); Tracheobronchomalacia; Acute on chronic respiratory failure with hypoxia and hypercapnia (HCC); COPD with acute exacerbation ([place]); RSV (respiratory syncytial  virus pneumonia); Essential hypertension; and Malnutrition of moderate degree on their problem list.  Significant Hospital Events: Including procedures, antibiotic start and stop dates in addition to other pertinent events   12/25 admit. 12/26 PCCM consult.  Interim History / Subjective:  Still on NIPPV. She reports some improvement and has been able to come off for a few minutes to take in nutrition.   Objective:  Blood pressure 119/78, pulse 88, temperature 97.6 F (36.4 C), resp. rate 12, height '5\' 2"'$  (1.575 m), weight 58.7 kg, SpO2 96 %.    Vent Mode: BIPAP FiO2 (%):  [30 %-56 %] 40 % Set Rate:  [10 bmp] 10 bmp PEEP:  [5 cmH20] 5 cmH20   Intake/Output Summary (Last 24 hours) at 05/24/2022 1442 Last data filed at 05/24/2022 1300 Gross per 24 hour  Intake 674.82 ml  Output 340 ml  Net 334.82 ml    Filed Weights   05/21/22 0517 05/22/22 0000 05/23/22 0500  Weight: 60.1 kg 59.4 kg 58.7 kg    Examination: General 71 year old female resting on NIPPV still has sig WOB HENT face mask in place Pulm decreased throughout, better air movement at the bases with less wheezing  Card rrr Abd soft Ext warm  Neuro awake. Anxious no distress, on low dose dexmedetomidine  Labs/imaging personally reviewed:  CXR 12/26  >emphysema. CO2 44 WBC 5.2 BNP 50.6 Assessment & Plan:   AECOPD - 2/2 RSV +/- bacterial component. Acute on chronic hypoxic and hypercarbic respiratory failure. improving air movement  Plan Start steroid taper. Cont scheduled brovana/budesonide and yupelri PRN duoneb Day 3 of 3 azith  Day 4 of 7 ctx Precedex for anxiety  Hx OSA. Plan BIPAP HS and PRN for now   Hx of Hypertension - continue home  Norvasc.   Best Practice (right click and "Reselect all SmartList Selections" daily)   Diet/type: NPO w/ oral meds oral intake as tolerated.  DVT prophylaxis: LMWH GI prophylaxis: PPI Lines: N/A Foley:  Yes, and it is still needed Code Status:  full  code Last date of multidisciplinary goals of care discussion [12/30 at bedside]  CRITICAL CARE Performed by: Kipp Brood   Total critical care time: 30 minutes  Critical care time was exclusive of separately billable procedures and treating other patients.  Critical care was necessary to treat or prevent imminent or life-threatening deterioration.  Critical care was time spent personally by me on the following activities: development of treatment plan with patient and/or surrogate as well as nursing, discussions with consultants, evaluation of patient's response to treatment, examination of patient, obtaining history from patient or surrogate, ordering and performing treatments and interventions, ordering and review of laboratory studies, ordering and review of radiographic studies, pulse oximetry, re-evaluation of patient's condition and participation in multidisciplinary rounds.  Kipp Brood, MD [facility] ICU Physician [place]  Pager: [phone] Mobile: [phone] After hours: [phone].

## 2022-05-24 NOTE — Progress Notes (Signed)
RT NOTE:  RT attempted  to complete NIF/VC. Patient is unable to stay off bipap to complete. Patient desats when BIPAP comes off. RT will attempt at next scheduled time.

## 2022-05-24 NOTE — Progress Notes (Signed)
RT NOTE: RT attempted multiple times to complete NIF/VC. Patient is unable to stay off bipap. RT will attempt at next scheduled time.

## 2022-05-25 ENCOUNTER — Inpatient Hospital Stay (HOSPITAL_COMMUNITY): Payer: 59

## 2022-05-25 DIAGNOSIS — J9621 Acute and chronic respiratory failure with hypoxia: Secondary | ICD-10-CM | POA: Diagnosis not present

## 2022-05-25 DIAGNOSIS — J9622 Acute and chronic respiratory failure with hypercapnia: Secondary | ICD-10-CM | POA: Diagnosis not present

## 2022-05-25 LAB — URINALYSIS, ROUTINE W REFLEX MICROSCOPIC
Bilirubin Urine: NEGATIVE
Glucose, UA: NEGATIVE mg/dL
Hgb urine dipstick: NEGATIVE
Ketones, ur: NEGATIVE mg/dL
Leukocytes,Ua: NEGATIVE
Nitrite: NEGATIVE
Protein, ur: NEGATIVE mg/dL
Specific Gravity, Urine: 1.005 (ref 1.005–1.030)
pH: 7 (ref 5.0–8.0)

## 2022-05-25 LAB — POCT I-STAT 7, (LYTES, BLD GAS, ICA,H+H)
Acid-Base Excess: 18 mmol/L — ABNORMAL HIGH (ref 0.0–2.0)
Bicarbonate: 46 mmol/L — ABNORMAL HIGH (ref 20.0–28.0)
Calcium, Ion: 1.33 mmol/L (ref 1.15–1.40)
HCT: 36 % (ref 36.0–46.0)
Hemoglobin: 12.2 g/dL (ref 12.0–15.0)
O2 Saturation: 94 %
Patient temperature: 97.5
Potassium: 4.6 mmol/L (ref 3.5–5.1)
Sodium: 142 mmol/L (ref 135–145)
TCO2: 48 mmol/L — ABNORMAL HIGH (ref 22–32)
pCO2 arterial: 66.6 mmHg (ref 32–48)
pH, Arterial: 7.445 (ref 7.35–7.45)
pO2, Arterial: 71 mmHg — ABNORMAL LOW (ref 83–108)

## 2022-05-25 LAB — GLUCOSE, CAPILLARY
Glucose-Capillary: 145 mg/dL — ABNORMAL HIGH (ref 70–99)
Glucose-Capillary: 125 mg/dL — ABNORMAL HIGH (ref 70–99)
Glucose-Capillary: 145 mg/dL — ABNORMAL HIGH (ref 70–99)

## 2022-05-25 MED ORDER — IPRATROPIUM-ALBUTEROL 0.5-2.5 (3) MG/3ML IN SOLN
3.0000 mL | Freq: Four times a day (QID) | RESPIRATORY_TRACT | Status: DC
  Administered 2022-05-25 – 2022-05-30 (×23): 3 mL via RESPIRATORY_TRACT
  Filled 2022-05-25 (×21): qty 3

## 2022-05-25 MED ORDER — INSULIN ASPART 100 UNIT/ML IJ SOLN
0.0000 [IU] | Freq: Three times a day (TID) | INTRAMUSCULAR | Status: DC
  Administered 2022-05-26 – 2022-05-27 (×4): 1 [IU] via SUBCUTANEOUS

## 2022-05-25 MED ORDER — DEXMEDETOMIDINE HCL IN NACL 400 MCG/100ML IV SOLN
0.0000 ug/kg/h | INTRAVENOUS | Status: DC
  Administered 2022-05-25: 0.04 ug/kg/h via INTRAVENOUS
  Administered 2022-05-26: 0.4 ug/kg/h via INTRAVENOUS
  Administered 2022-05-26: 1 ug/kg/h via INTRAVENOUS
  Administered 2022-05-27: 0.6 ug/kg/h via INTRAVENOUS
  Administered 2022-05-28 (×2): 0.7 ug/kg/h via INTRAVENOUS
  Administered 2022-05-28 – 2022-05-29 (×2): 0.9 ug/kg/h via INTRAVENOUS
  Administered 2022-05-29: 1 ug/kg/h via INTRAVENOUS
  Administered 2022-05-30: 0.4 ug/kg/h via INTRAVENOUS
  Administered 2022-05-30: 0.2 ug/kg/h via INTRAVENOUS
  Filled 2022-05-25 (×12): qty 100

## 2022-05-25 NOTE — Progress Notes (Signed)
RT received call by RN due to patients sats dropping on HHFNC 60L 95%. Upon RT arrival patients sats dropped to the 50's with RN and RT at bedside putting bipap back on patient. After a couple minutes patients sats returned to the 90's on bipap. Patient tolerating well at this time. MD aware and at bedside as well. RT will continue to monitor.

## 2022-05-25 NOTE — Progress Notes (Signed)
NAME:  Abigail Castillo, MRN:  [id number], DOB:  1951-03-30, LOS: 6 ADMISSION DATE:  05/19/2022, CONSULTATION DATE:  05/20/22 REFERRING MD:  Roger Shelter CHIEF COMPLAINT:  Dyspnea   History of Present Illness:  Abigail Castillo is a 71 y.o. female who has a PMH as below including but not limited to COPD on chronic 2-3L O2 and '10mg'$  Prednisone, OSA on nocturnal BiPAP, and who is followed by Dr. Lamonte Sakai in our office. She had recent admission 11/15 through 12/18 for AECOPD and was then discharged to [facility]  She was recently seen as an outpatient by Dr. Lamonte Sakai on 12/22 and was prescribed a slow prednisone taper with instructions to drop to '10mg'$  and continue until her next follow up appointment.  She presented back to [facility] ED 12/25 with dyspnea and hypoxia down to 65%. She was found to have hypoxic and hypercapnic respiratory failure and her RSV swab returned positive. She was placed on BiPAP and was admitted by [facility].  On 12/26, PCCM was consulted for assistance with BiPAP and COPD management.  Pertinent  Medical History:  has History of tobacco abuse; Depression; COPD (chronic obstructive pulmonary disease) ([place]); Osteoporosis; HOARSENESS, CHRONIC; HYPERTRIGLYCERIDEMIA; Memory change; B12 deficiency; Personal history of colonic polyps; Encounter for routine gynecological examination; Mild hyperlipidemia; Vitamin D deficiency; Esophageal stricture; GERD (gastroesophageal reflux disease); Schizoaffective disorder ([place]); Allergic rhinitis; Chronic respiratory failure ([place]); Acute on chronic respiratory failure with hypoxia ([place]); Elevated blood pressure reading without diagnosis of hypertension; Hepatic steatosis; Ductal carcinoma in situ (DCIS) of left breast; On home oxygen therapy; Hyponatremia; Bronchomalacia; Localized edema; COPD exacerbation ([place]); Tracheobronchomalacia; Acute on chronic respiratory failure with hypoxia and hypercapnia (HCC); COPD with acute exacerbation ([place]); RSV (respiratory syncytial  virus pneumonia); Essential hypertension; and Malnutrition of moderate degree on their problem list.  Significant Hospital Events: Including procedures, antibiotic start and stop dates in addition to other pertinent events   12/25 admit. 12/26 PCCM consult.  Interim History / Subjective:  Still on NIPPV. She reports some improvement, but seems to not tolerate coming off BiPAP for any length of time.   Objective:  Blood pressure (!) 154/77, pulse 76, temperature (!) 97.5 F (36.4 C), temperature source Axillary, resp. rate 13, height '5\' 2"'$  (1.575 m), weight 58.7 kg, SpO2 97 %.    Vent Mode: BIPAP FiO2 (%):  [40 %-50 %] 40 % Set Rate:  [10 bmp] 10 bmp PEEP:  [5 cmH20] 5 cmH20 Pressure Support:  [5 cmH20] 5 cmH20   Intake/Output Summary (Last 24 hours) at 05/25/2022 0923 Last data filed at 05/25/2022 0600 Gross per 24 hour  Intake 510.17 ml  Output 400 ml  Net 110.17 ml    Filed Weights   05/21/22 0517 05/22/22 0000 05/23/22 0500  Weight: 60.1 kg 59.4 kg 58.7 kg    Examination: General 71 year old female resting on NIPPV. Appears comfortable/  HENT face mask in place, pressure dressing on nasal bridge.  Pulm decreased throughout, occasional wheezing. Unchanged from yesterday. Card rrr Abd soft Ext warm  Neuro awake. Anxious no distress, on low dose dexmedetomidine  Labs/imaging personally reviewed:  CXR 12/26  >emphysema. No infiltrated.  CO2 44 WBC 5.2 BNP 50.6 ABG: 7.45/66/71/488 Assessment & Plan:   AECOPD - 2/2 RSV +/- bacterial component. Acute on chronic hypoxic and hypercarbic respiratory failure.  Will be slow to improve given severity of underlying lung disease.  Start steroid taper. Cont scheduled brovana/budesonide and yupelri PRN duoneb Complete course of empiric CAP coverage, although likely all  RSV.  Precedex for anxiety  Hx OSA. Plan BIPAP HS and PRN for now   Hx of Hypertension - continue home Norvasc.   Best Practice (right click and  "Reselect all SmartList Selections" daily)   Diet/type: NPO w/ oral meds oral intake as tolerated.  DVT prophylaxis: LMWH GI prophylaxis: PPI Lines: N/A Foley:  Yes, and it is still needed Code Status:  full code Last date of multidisciplinary goals of care discussion [12/30 at bedside]  CRITICAL CARE Performed by: Kipp Brood   Total critical care time: 30 minutes  Critical care time was exclusive of separately billable procedures and treating other patients.  Critical care was necessary to treat or prevent imminent or life-threatening deterioration.  Critical care was time spent personally by me on the following activities: development of treatment plan with patient and/or surrogate as well as nursing, discussions with consultants, evaluation of patient's response to treatment, examination of patient, obtaining history from patient or surrogate, ordering and performing treatments and interventions, ordering and review of laboratory studies, ordering and review of radiographic studies, pulse oximetry, re-evaluation of patient's condition and participation in multidisciplinary rounds.  Kipp Brood, MD [facility] ICU Physician [place]  Pager: [phone] Mobile: [phone] After hours: [phone].

## 2022-05-25 NOTE — Progress Notes (Signed)
RT NOTE:  Patient is very weak and unable to come off BIPAP long enough to perform NIF/VC. RT will attempt when patient is able to come off BIPAP.

## 2022-05-26 DIAGNOSIS — J9621 Acute and chronic respiratory failure with hypoxia: Secondary | ICD-10-CM | POA: Diagnosis not present

## 2022-05-26 DIAGNOSIS — J9622 Acute and chronic respiratory failure with hypercapnia: Secondary | ICD-10-CM | POA: Diagnosis not present

## 2022-05-26 LAB — GLUCOSE, CAPILLARY
Glucose-Capillary: 105 mg/dL — ABNORMAL HIGH (ref 70–99)
Glucose-Capillary: 115 mg/dL — ABNORMAL HIGH (ref 70–99)
Glucose-Capillary: 158 mg/dL — ABNORMAL HIGH (ref 70–99)
Glucose-Capillary: 163 mg/dL — ABNORMAL HIGH (ref 70–99)
Glucose-Capillary: 181 mg/dL — ABNORMAL HIGH (ref 70–99)
Glucose-Capillary: 188 mg/dL — ABNORMAL HIGH (ref 70–99)

## 2022-05-26 LAB — URINE CULTURE: Culture: NO GROWTH

## 2022-05-26 NOTE — Progress Notes (Signed)
Pt placed on heated high flow nasal cannula at this time.  Tolerating well.

## 2022-05-26 NOTE — Evaluation (Signed)
Clinical/Bedside Swallow Evaluation Patient Details  Name: SERGIO HOBART MRN: [id number] Date of Birth: [birth year]  Today's Date: 05/26/2022 Time: SLP Start Time (ACUTE ONLY): 1400 SLP Stop Time (ACUTE ONLY): 1430 SLP Time Calculation (min) (ACUTE ONLY): 30 min  Past Medical History:  Past Medical History:  Diagnosis Date   Anxiety    Blood transfusion    Blood transfusion without reported diagnosis    Breast cancer ([place]) 05/13/2021   Chronic mental illness    COPD (chronic obstructive pulmonary disease) ([place])    DEPRESSION 05/31/2009   Esophageal stricture 03/14/2015   GERD (gastroesophageal reflux disease)    Hepatic steatosis    Osteoporosis    Oxygen dependent    on O2 at 2-3L/min 24hr/day   Tobacco abuse    ongoing   Vitamin B 12 deficiency    Vitamin D deficiency    Past Surgical History:  Past Surgical History:  Procedure Laterality Date   ABDOMINAL HYSTERECTOMY     BREAST EXCISIONAL BIOPSY Left 2017   BREAST LUMPECTOMY  1972   left breast   BREAST LUMPECTOMY WITH RADIOACTIVE SEED LOCALIZATION Left 11/22/2015   Procedure: LEFT BREAST LUMPECTOMY WITH RADIOACTIVE SEED LOCALIZATION;  Surgeon: Erroll Luna, MD;  Location: [facility];  Service: General;  Laterality: Left;   BREAST LUMPECTOMY WITH RADIOACTIVE SEED LOCALIZATION Left 07/03/2021   Procedure: LEFT BREAST LUMPECTOMY WITH RADIOACTIVE SEED LOCALIZATION;  Surgeon: Erroll Luna, MD;  Location: [facility];  Service: General;  Laterality: Left;   BREAST SURGERY  ?2015   lumpectomy, left   CERVICAL CONE BIOPSY     CHOLECYSTECTOMY     COLONOSCOPY     ESOPHAGOGASTRODUODENOSCOPY (EGD) WITH ESOPHAGEAL DILATION     UPPER GASTROINTESTINAL ENDOSCOPY     HPI:  ELLENI MOZINGO is a 72 y.o. female admitted 12/25 with dyspnea and hypoxia down to 65%. She was found to have hypoxic and hypercapnic respiratory failure and her RSV swab returned positive. She was placed on BiPAP until 1/1 when she transitioned to [facility]. She had recent  admission 11/15 through 12/18 for AECOPD and was then discharged to [facility].  She was recently seen as an outpatient by Dr. Lamonte Sakai on 12/22 and was prescribed a slow prednisone taper. Pt has a PMH including COPD on chronic 2-3L O2 and '10mg'$  Prednisone, OSA on nocturnal BiPAP, and who is followed by Dr. Lamonte Sakai. No SLP notes in chart.    Assessment / Plan / Recommendation  Clinical Impression  Pt demonstrates no signs of oropharyngeal dysphagia. She is alert and attentive, able to self feed with support. Is on 30Lpm HHFNC, but able to pass [place] without difficulty. Exhbits no sign of pharyngeal weakness subjectively, was never intubated. Pt recommended to resume a puree diet and thin liquids for now; she does not have dentures and prefers soft foods. Will f/u for tolerance and upgrade. SLP Visit Diagnosis: Dysphagia, unspecified (R13.10)    Aspiration Risk  Mild aspiration risk    Diet Recommendation Dysphagia 1 (Puree);Thin liquid   Liquid Administration via: Cup;Straw Medication Administration: Whole meds with liquid Supervision: Staff to assist with self feeding Compensations: Slow rate;Small sips/bites Postural Changes: Seated upright at 90 degrees    Other  Recommendations      Recommendations for follow up therapy are one component of a multi-disciplinary discharge planning process, led by the attending physician.  Recommendations may be updated based on patient status, additional functional criteria and insurance authorization.  Follow up Recommendations Skilled nursing-short term rehab (<3 hours/day)  Assistance Recommended at Discharge    Functional Status Assessment Patient has had a recent decline in their functional status and demonstrates the ability to make significant improvements in function in a reasonable and predictable amount of time.  Frequency and Duration            Prognosis Prognosis for Safe Diet Advancement: Good      Swallow Study   General HPI:  LATISHA LASCH is a 72 y.o. female admitted 12/25 with dyspnea and hypoxia down to 65%. She was found to have hypoxic and hypercapnic respiratory failure and her RSV swab returned positive. She was placed on BiPAP until 1/1 when she transitioned to [facility]. She had recent admission 11/15 through 12/18 for AECOPD and was then discharged to [facility].  She was recently seen as an outpatient by Dr. Lamonte Sakai on 12/22 and was prescribed a slow prednisone taper. Pt has a PMH including COPD on chronic 2-3L O2 and '10mg'$  Prednisone, OSA on nocturnal BiPAP, and who is followed by Dr. Lamonte Sakai. No SLP notes in chart. Type of Study: Bedside Swallow Evaluation Previous Swallow Assessment: none Diet Prior to this Study: NPO Temperature Spikes Noted: No Respiratory Status: Nasal cannula History of Recent Intubation: No Behavior/Cognition: Alert;Cooperative;Pleasant mood Oral Cavity Assessment: Within Functional Limits Oral Care Completed by SLP: No Oral Cavity - Dentition: Edentulous Vision: Functional for self-feeding Self-Feeding Abilities: Able to feed self Patient Positioning: Upright in bed Baseline Vocal Quality: Low vocal intensity;Breathy Volitional Cough: Weak Volitional Swallow: Able to elicit    Oral/Motor/Sensory Function Overall Oral Motor/Sensory Function: Within functional limits   Ice Chips     Thin Liquid Thin Liquid: Within functional limits Presentation: Straw;Self Fed;Cup    Nectar Thick Nectar Thick Liquid: Not tested   Honey Thick Honey Thick Liquid: Not tested   Puree Puree: Within functional limits Presentation: Spoon   Solid     Solid: Not tested      Lynann Beaver 05/26/2022,4:00 PM

## 2022-05-26 NOTE — Progress Notes (Signed)
Pt on BIPAP @ this time, unable to do a NIF and VC.

## 2022-05-26 NOTE — Progress Notes (Signed)
NAME:  Abigail Castillo, MRN:  [id number], DOB:  1950-09-30, LOS: 7 ADMISSION DATE:  05/19/2022, CONSULTATION DATE:  05/20/22 REFERRING MD:  Roger Shelter CHIEF COMPLAINT:  Dyspnea   History of Present Illness:  Abigail Castillo is a 72 y.o. female who has a PMH as below including but not limited to COPD on chronic 2-3L O2 and '10mg'$  Prednisone, OSA on nocturnal BiPAP, and who is followed by Dr. Lamonte Sakai in our office. She had recent admission 11/15 through 12/18 for AECOPD and was then discharged to [facility].  She was recently seen as an outpatient by Dr. Lamonte Sakai on 12/22 and was prescribed a slow prednisone taper with instructions to drop to '10mg'$  and continue until her next follow up appointment.  She presented back to [facility] ED 12/25 with dyspnea and hypoxia down to 65%. She was found to have hypoxic and hypercapnic respiratory failure and her RSV swab returned positive. She was placed on BiPAP and was admitted by [facility].  On 12/26, PCCM was consulted for assistance with BiPAP and COPD management.  Pertinent  Medical History:  has History of tobacco abuse; Depression; COPD (chronic obstructive pulmonary disease) ([place]); Osteoporosis; HOARSENESS, CHRONIC; HYPERTRIGLYCERIDEMIA; Memory change; B12 deficiency; Personal history of colonic polyps; Encounter for routine gynecological examination; Mild hyperlipidemia; Vitamin D deficiency; Esophageal stricture; GERD (gastroesophageal reflux disease); Schizoaffective disorder ([place]); Allergic rhinitis; Chronic respiratory failure ([place]); Acute on chronic respiratory failure with hypoxia ([place]); Elevated blood pressure reading without diagnosis of hypertension; Hepatic steatosis; Ductal carcinoma in situ (DCIS) of left breast; On home oxygen therapy; Hyponatremia; Bronchomalacia; Localized edema; COPD exacerbation ([place]); Tracheobronchomalacia; Acute on chronic respiratory failure with hypoxia and hypercapnia (HCC); COPD with acute exacerbation ([place]); RSV (respiratory syncytial  virus pneumonia); Essential hypertension; and Malnutrition of moderate degree on their problem list.  Significant Hospital Events: Including procedures, antibiotic start and stop dates in addition to other pertinent events   12/25 admit. 12/26 PCCM consult.  Interim History / Subjective:  Still on NIPPV. She tolerated being off BiPAP for 3 hours today.   Objective:  Blood pressure 124/78, pulse 85, temperature (!) 97.1 F (36.2 C), temperature source Axillary, resp. rate 12, height '5\' 2"'$  (1.575 m), weight 58.7 kg, SpO2 98 %.    Vent Mode: BIPAP FiO2 (%):  [40 %] 40 % Set Rate:  [10 bmp] 10 bmp PEEP:  [5 cmH20-6 cmH20] 6 cmH20   Intake/Output Summary (Last 24 hours) at 05/26/2022 1629 Last data filed at 05/26/2022 1600 Gross per 24 hour  Intake 451.41 ml  Output 1375 ml  Net -923.59 ml    Filed Weights   05/21/22 0517 05/22/22 0000 05/23/22 0500  Weight: 60.1 kg 59.4 kg 58.7 kg    Examination: General 72 year old female resting on NIPPV. Appears comfortable/  HENT face mask in place, pressure dressing on nasal bridge.  Pulm decreased throughout, occasional wheezing. Unchanged from yesterday. Card rrr Abd soft Ext warm  Neuro awake. Anxious no distress, on low dose dexmedetomidine  Labs/imaging personally reviewed:  CXR 12/26  >emphysema. No infiltrated.  CO2 44 WBC 5.2 BNP 50.6 ABG: 7.45/66/71/488 Assessment & Plan:   AECOPD - 2/2 RSV +/- bacterial component. Acute on chronic hypoxic and hypercarbic respiratory failure.  Will be slow to improve given severity of underlying lung disease.  Start steroid taper. Cont scheduled brovana/budesonide and yupelri PRN duoneb Complete course of empiric CAP coverage, although likely all RSV.  Precedex for anxiety Recovery will take time.  Hx OSA. Plan BIPAP HS and  PRN for now   Hx of Hypertension - continue home Norvasc.   Best Practice (right click and "Reselect all SmartList Selections" daily)   Diet/type: NPO w/  oral meds oral intake as tolerated.  DVT prophylaxis: LMWH GI prophylaxis: PPI Lines: N/A Foley:  Yes, and it is still needed Code Status:  full code Last date of multidisciplinary goals of care discussion [12/30 at bedside]  CRITICAL CARE Performed by: Kipp Brood  Total critical care time: 30 minutes  Critical care time was exclusive of separately billable procedures and treating other patients.  Critical care was necessary to treat or prevent imminent or life-threatening deterioration.  Critical care was time spent personally by me on the following activities: development of treatment plan with patient and/or surrogate as well as nursing, discussions with consultants, evaluation of patient's response to treatment, examination of patient, obtaining history from patient or surrogate, ordering and performing treatments and interventions, ordering and review of laboratory studies, ordering and review of radiographic studies, pulse oximetry, re-evaluation of patient's condition and participation in multidisciplinary rounds.  Kipp Brood, MD [facility] ICU Physician Outlook  Pager: [phone] Mobile: [phone] After hours: [phone].

## 2022-05-26 NOTE — Progress Notes (Signed)
Pt is BIPAP dependant at this time.  She is not able to perform NIF /VC maneuver.

## 2022-05-27 ENCOUNTER — Inpatient Hospital Stay (HOSPITAL_COMMUNITY): Payer: 59

## 2022-05-27 DIAGNOSIS — J9622 Acute and chronic respiratory failure with hypercapnia: Secondary | ICD-10-CM | POA: Diagnosis not present

## 2022-05-27 DIAGNOSIS — E440 Moderate protein-calorie malnutrition: Secondary | ICD-10-CM | POA: Diagnosis not present

## 2022-05-27 DIAGNOSIS — J9621 Acute and chronic respiratory failure with hypoxia: Secondary | ICD-10-CM | POA: Diagnosis not present

## 2022-05-27 DIAGNOSIS — J441 Chronic obstructive pulmonary disease with (acute) exacerbation: Secondary | ICD-10-CM | POA: Diagnosis not present

## 2022-05-27 DIAGNOSIS — J9602 Acute respiratory failure with hypercapnia: Secondary | ICD-10-CM | POA: Diagnosis not present

## 2022-05-27 DIAGNOSIS — J121 Respiratory syncytial virus pneumonia: Secondary | ICD-10-CM | POA: Diagnosis not present

## 2022-05-27 LAB — POCT I-STAT 7, (LYTES, BLD GAS, ICA,H+H)
Acid-Base Excess: 10 mmol/L — ABNORMAL HIGH (ref 0.0–2.0)
Bicarbonate: 35.9 mmol/L — ABNORMAL HIGH (ref 20.0–28.0)
Calcium, Ion: 1.24 mmol/L (ref 1.15–1.40)
HCT: 38 % (ref 36.0–46.0)
Hemoglobin: 12.9 g/dL (ref 12.0–15.0)
O2 Saturation: 90 %
Patient temperature: 98.4
Potassium: 3.6 mmol/L (ref 3.5–5.1)
Sodium: 139 mmol/L (ref 135–145)
TCO2: 38 mmol/L — ABNORMAL HIGH (ref 22–32)
pCO2 arterial: 53.6 mmHg — ABNORMAL HIGH (ref 32–48)
pH, Arterial: 7.434 (ref 7.35–7.45)
pO2, Arterial: 59 mmHg — ABNORMAL LOW (ref 83–108)

## 2022-05-27 LAB — HEMOGLOBIN A1C
Hgb A1c MFr Bld: 6.1 % — ABNORMAL HIGH (ref 4.8–5.6)
Mean Plasma Glucose: 128 mg/dL

## 2022-05-27 LAB — CBC WITH DIFFERENTIAL/PLATELET
Abs Immature Granulocytes: 0.26 10*3/uL — ABNORMAL HIGH (ref 0.00–0.07)
Basophils Absolute: 0 10*3/uL (ref 0.0–0.1)
Basophils Relative: 1 %
Eosinophils Absolute: 0 10*3/uL (ref 0.0–0.5)
Eosinophils Relative: 0 %
HCT: 36.5 % (ref 36.0–46.0)
Hemoglobin: 11.9 g/dL — ABNORMAL LOW (ref 12.0–15.0)
Immature Granulocytes: 3 %
Lymphocytes Relative: 7 %
Lymphs Abs: 0.6 10*3/uL — ABNORMAL LOW (ref 0.7–4.0)
MCH: 31.6 pg (ref 26.0–34.0)
MCHC: 32.6 g/dL (ref 30.0–36.0)
MCV: 97.1 fL (ref 80.0–100.0)
Monocytes Absolute: 0.3 10*3/uL (ref 0.1–1.0)
Monocytes Relative: 3 %
Neutro Abs: 7 10*3/uL (ref 1.7–7.7)
Neutrophils Relative %: 86 %
Platelets: 113 10*3/uL — ABNORMAL LOW (ref 150–400)
RBC: 3.76 MIL/uL — ABNORMAL LOW (ref 3.87–5.11)
RDW: 13.7 % (ref 11.5–15.5)
Smear Review: DECREASED
WBC: 8.1 10*3/uL (ref 4.0–10.5)
nRBC: 0 % (ref 0.0–0.2)

## 2022-05-27 LAB — GLUCOSE, CAPILLARY
Glucose-Capillary: 121 mg/dL — ABNORMAL HIGH (ref 70–99)
Glucose-Capillary: 125 mg/dL — ABNORMAL HIGH (ref 70–99)
Glucose-Capillary: 129 mg/dL — ABNORMAL HIGH (ref 70–99)
Glucose-Capillary: 165 mg/dL — ABNORMAL HIGH (ref 70–99)
Glucose-Capillary: 170 mg/dL — ABNORMAL HIGH (ref 70–99)

## 2022-05-27 LAB — BASIC METABOLIC PANEL
Anion gap: 9 (ref 5–15)
BUN: 32 mg/dL — ABNORMAL HIGH (ref 8–23)
CO2: 37 mmol/L — ABNORMAL HIGH (ref 22–32)
Calcium: 9.3 mg/dL (ref 8.9–10.3)
Chloride: 93 mmol/L — ABNORMAL LOW (ref 98–111)
Creatinine, Ser: 0.46 mg/dL (ref 0.44–1.00)
GFR, Estimated: >60 mL/min (ref >60)
Glucose, Bld: 173 mg/dL — ABNORMAL HIGH (ref 70–99)
Potassium: 4.2 mmol/L (ref 3.5–5.1)
Sodium: 139 mmol/L (ref 135–145)

## 2022-05-27 MED ORDER — ANASTROZOLE 1 MG PO TABS
1.0000 mg | ORAL_TABLET | Freq: Every morning | ORAL | Status: DC
  Administered 2022-05-28 – 2022-07-04 (×36): 1 mg
  Filled 2022-05-27 (×38): qty 1

## 2022-05-27 MED ORDER — FENTANYL CITRATE PF 50 MCG/ML IJ SOSY
50.0000 ug | PREFILLED_SYRINGE | INTRAMUSCULAR | Status: DC | PRN
  Administered 2022-05-27: 50 ug via INTRAVENOUS
  Filled 2022-05-27: qty 1

## 2022-05-27 MED ORDER — FENTANYL BOLUS VIA INFUSION
25.0000 ug | INTRAVENOUS | Status: DC | PRN
  Administered 2022-05-27: 25 ug via INTRAVENOUS
  Administered 2022-05-28: 50 ug via INTRAVENOUS
  Administered 2022-05-28: 75 ug via INTRAVENOUS
  Administered 2022-05-28: 50 ug via INTRAVENOUS

## 2022-05-27 MED ORDER — DOCUSATE SODIUM 50 MG/5ML PO LIQD
100.0000 mg | Freq: Two times a day (BID) | ORAL | Status: DC
  Administered 2022-05-27 – 2022-06-05 (×13): 100 mg
  Filled 2022-05-27 (×13): qty 10

## 2022-05-27 MED ORDER — FENTANYL 2500MCG IN NS 250ML (10MCG/ML) PREMIX INFUSION
25.0000 ug/h | INTRAVENOUS | Status: DC
  Administered 2022-05-27: 25 ug/h via INTRAVENOUS
  Administered 2022-05-28: 75 ug/h via INTRAVENOUS
  Filled 2022-05-27 (×2): qty 250

## 2022-05-27 MED ORDER — ROCURONIUM BROMIDE 50 MG/5ML IV SOLN
100.0000 mg | Freq: Once | INTRAVENOUS | Status: AC
  Filled 2022-05-27: qty 10

## 2022-05-27 MED ORDER — FENTANYL CITRATE PF 50 MCG/ML IJ SOSY: 25.0000 ug | PREFILLED_SYRINGE | Freq: Once | INTRAMUSCULAR | Status: DC

## 2022-05-27 MED ORDER — SODIUM CHLORIDE 0.9 % IV SOLN: 250.0000 mL | INTRAVENOUS | Status: DC

## 2022-05-27 MED ORDER — ROCURONIUM BROMIDE 10 MG/ML (PF) SYRINGE
PREFILLED_SYRINGE | INTRAVENOUS | Status: AC
  Administered 2022-05-27: 100 mg via INTRAVENOUS
  Filled 2022-05-27: qty 10

## 2022-05-27 MED ORDER — ORAL CARE MOUTH RINSE: 15.0000 mL | OROMUCOSAL | Status: DC | PRN

## 2022-05-27 MED ORDER — AMIODARONE HCL IN DEXTROSE 360-4.14 MG/200ML-% IV SOLN
INTRAVENOUS | Status: AC
  Filled 2022-05-27: qty 200

## 2022-05-27 MED ORDER — LACTATED RINGERS IV BOLUS
750.0000 mL | Freq: Once | INTRAVENOUS | Status: AC
  Administered 2022-05-27: 750 mL via INTRAVENOUS

## 2022-05-27 MED ORDER — PREDNISONE 20 MG PO TABS
20.0000 mg | ORAL_TABLET | Freq: Every day | ORAL | Status: DC
  Filled 2022-05-27: qty 1

## 2022-05-27 MED ORDER — ETOMIDATE 2 MG/ML IV SOLN: 20.0000 mg | Freq: Once | INTRAVENOUS | Status: AC

## 2022-05-27 MED ORDER — OLANZAPINE 5 MG PO TABS
7.5000 mg | ORAL_TABLET | Freq: Every morning | ORAL | Status: DC
  Administered 2022-05-28 – 2022-05-31 (×4): 7.5 mg
  Filled 2022-05-27 (×5): qty 1

## 2022-05-27 MED ORDER — AMLODIPINE BESYLATE 5 MG PO TABS: 5.0000 mg | ORAL_TABLET | Freq: Every morning | ORAL | Status: DC

## 2022-05-27 MED ORDER — ADULT MULTIVITAMIN W/MINERALS CH
1.0000 | ORAL_TABLET | Freq: Every day | ORAL | Status: DC
  Administered 2022-05-28 – 2022-07-04 (×36): 1
  Filled 2022-05-27 (×37): qty 1

## 2022-05-27 MED ORDER — ORAL CARE MOUTH RINSE
15.0000 mL | OROMUCOSAL | Status: DC
  Administered 2022-05-27 – 2022-05-28 (×12): 15 mL via OROMUCOSAL

## 2022-05-27 MED ORDER — PREDNISONE 20 MG PO TABS: 10.0000 mg | ORAL_TABLET | Freq: Every day | ORAL | Status: DC

## 2022-05-27 MED ORDER — ETOMIDATE 2 MG/ML IV SOLN
INTRAVENOUS | Status: AC
  Administered 2022-05-27: 20 mg via INTRAVENOUS
  Filled 2022-05-27: qty 10

## 2022-05-27 MED ORDER — METHYLPREDNISOLONE SODIUM SUCC 40 MG IJ SOLR
40.0000 mg | INTRAMUSCULAR | Status: AC
  Administered 2022-05-27 – 2022-05-31 (×5): 40 mg via INTRAVENOUS
  Filled 2022-05-27 (×5): qty 1

## 2022-05-27 MED ORDER — POLYETHYLENE GLYCOL 3350 17 G PO PACK
17.0000 g | PACK | Freq: Every day | ORAL | Status: DC
  Administered 2022-05-27 – 2022-06-05 (×7): 17 g
  Filled 2022-05-27 (×7): qty 1

## 2022-05-27 MED ORDER — NOREPINEPHRINE 4 MG/250ML-% IV SOLN: 2.0000 ug/min | INTRAVENOUS | Status: DC

## 2022-05-27 NOTE — Progress Notes (Signed)
Nutrition Follow-up  DOCUMENTATION CODES:  Non-severe (moderate) malnutrition in context of acute illness/injury  INTERVENTION:  Once enteral access is achieved, recommend the following: Osmolite 1.5 at 50 ml/h (1200 ml per day) Prosource TF20 60 ml 1x/d Provides 1880 kcal, 95 gm protein, 914 ml free water daily Continue MVI daily  NUTRITION DIAGNOSIS:  Moderate Malnutrition related to acute illness (COPD exacerbation) as evidenced by mild fat depletion, mild muscle depletion, percent weight loss. - remains applicable  GOAL:  Patient will meet greater than or equal to 90% of their needs - progressing, diet and supplements in place  MONITOR:  PO intake, Supplement acceptance, Labs, Weight trends  REASON FOR ASSESSMENT:  Consult Assessment of nutrition requirement/status  ASSESSMENT:  Pt admitted from [facility] with SOB r/t COPD exacerbation. PMH significant for breast cancer (2022), COPD on 2-3L home O2, depression/anxiety. Recently admitted 11/15-12/18 for COPD exacerbation.  Attempted to visit pt today, on weaning trial from BiPAP but O2 levels in the 60s on arrival and RT/RN were placing pt back on BiPAP. Per RN, pt will likely be intubated  Pt has been unable to tolerate diet due to her poor respiratory status. On the cortrak list for tomorrow. Will place TF recommendations to be utilized once access is achieved.   Intake/Output Summary (Last 24 hours) at 05/27/2022 1518 Last data filed at 05/27/2022 1300 Gross per 24 hour  Intake 436.38 ml  Output 1230 ml  Net -793.62 ml  Net IO Since Admission: -2,439.63 mL [05/27/22 1518]  Nutritionally Relevant Medications: Scheduled Meds:  docusate sodium  100 mg Oral BID   Ensure Enlive  237 mL Oral BID BM   insulin aspart  0-6 Units Subcutaneous TID WC   lactobacillus  1 g Oral TID WC   multivitamin with minerals  1 tablet Oral Daily   pantoprazole (PROTONIX) IV  40 mg Intravenous QHS   Continuous Infusions:   dexmedetomidine (PRECEDEX) IV infusion 0.6 mcg/kg/hr (05/27/22 0700)   PRN Meds: ondansetron, polyethylene glycol  Labs Reviewed: Chloride 93 BUN 32 CBG ranges from 105-188 mg/dL over the last 24 hours  NUTRITION - FOCUSED PHYSICAL EXAM: Flowsheet Row Most Recent Value  Orbital Region Unable to assess  Upper Arm Region Mild depletion  Thoracic and Lumbar Region Mild depletion  Buccal Region Unable to assess  Temple Region No depletion  Clavicle Bone Region No depletion  Clavicle and Acromion Bone Region Mild depletion  Scapular Bone Region Mild depletion  Dorsal Hand Mild depletion  Patellar Region Mild depletion  Anterior Thigh Region Mild depletion  Posterior Calf Region Mild depletion  Edema (RD Assessment) None  Hair Reviewed  Eyes Reviewed  Mouth Unable to assess  Skin Reviewed  Nails Reviewed   Diet Order:   Diet Order             DIET - DYS 1 Room service appropriate? No; Fluid consistency: Thin  Diet effective now                   EDUCATION NEEDS:  Not appropriate for education at this time  Skin:  Skin Assessment: Reviewed RN Assessment Stage 1: sacrum  Last BM:  12/30 - type 6  Height:  Ht Readings from Last 1 Encounters:  05/21/22 '5\' 2"'$  (1.575 m)    Weight:  Wt Readings from Last 1 Encounters:  05/27/22 60.5 kg   BMI:  Body mass index is 24.4 kg/m.  Estimated Nutritional Needs:  Kcal:  1700-1900 kcal/d Protein:  90-105 Fluid:  1.8-2 L/d   Ranell Patrick, RD, LDN Clinical Dietitian RD pager # available in [facility]  After hours/weekend pager # available in [facility]

## 2022-05-27 NOTE — Progress Notes (Signed)
SLP Cancellation Note  Patient Details Name: SEVIN FARONE MRN: [id number] DOB: 1950-08-12   Cancelled treatment:       Reason Eval/Treat Not Completed: Medical issues which prohibited therapy. Unable to assess diet tolerance at this time, as pt is currently requiring BiPAP. Will continue efforts.    Rotunda Worden B. Quentin Ore, [facility], [place] Speech Language Pathologist Office: [phone]  Shonna Chock 05/27/2022, 12:10 PM

## 2022-05-27 NOTE — Progress Notes (Signed)
Dear Doctor: This patient has been identified as a candidate for PICC /CVCfor the following reason (s): IV therapy over 48 hours, drug pH or osmolality (causing phlebitis, infiltration in 24 hours), drug extravasation potential with tissue necrosis (KCL, Dilantin, Dopamine, CaCl, MgSO4, chemo vesicant), poor veins/poor circulatory system (CHF, COPD, emphysema, diabetes, steroid use, IV drug abuse, etc.), and incompatible drugs (aminophyllin, TPN, heparin, given with an antibiotic) If you agree, please write an order for the indicated device.   Thank you for supporting the early vascular access assessment program.

## 2022-05-27 NOTE — Progress Notes (Addendum)
Notified Tanya RN that vasopressors are NOT to be administered through upper arm USGPIV without exception. Also, placed sticker to IV site stating "no vasopressors." VU. Fran Lowes, RN VAST

## 2022-05-27 NOTE — Progress Notes (Signed)
NAME:  Abigail Castillo, MRN:  [id number], DOB:  10/21/1950, LOS: 8 ADMISSION DATE:  05/19/2022, CONSULTATION DATE:  05/20/22 REFERRING MD:  Roger Shelter CHIEF COMPLAINT:  Dyspnea   History of Present Illness:  Abigail Castillo is a 72 y.o. female who has a PMH as below including but not limited to COPD on chronic 2-3L O2 and '10mg'$  Prednisone, OSA on nocturnal BiPAP, and who is followed by Dr. Lamonte Sakai in our office. She had recent admission 11/15 through 12/18 for AECOPD and was then discharged to [facility].  She was recently seen as an outpatient by Dr. Lamonte Sakai on 12/22 and was prescribed a slow prednisone taper with instructions to drop to '10mg'$  and continue until her next follow up appointment.  She presented back to [facility] ED 12/25 with dyspnea and hypoxia down to 65%. She was found to have hypoxic and hypercapnic respiratory failure and her RSV swab returned positive. She was placed on BiPAP and was admitted by [facility].  On 12/26, PCCM was consulted for assistance with BiPAP and COPD management.  Pertinent  Medical History:  has History of tobacco abuse; Depression; COPD (chronic obstructive pulmonary disease) ([place]); Osteoporosis; HOARSENESS, CHRONIC; HYPERTRIGLYCERIDEMIA; Memory change; B12 deficiency; Personal history of colonic polyps; Encounter for routine gynecological examination; Mild hyperlipidemia; Vitamin D deficiency; Esophageal stricture; GERD (gastroesophageal reflux disease); Schizoaffective disorder ([place]); Allergic rhinitis; Chronic respiratory failure ([place]); Acute on chronic respiratory failure with hypoxia ([place]); Elevated blood pressure reading without diagnosis of hypertension; Hepatic steatosis; Ductal carcinoma in situ (DCIS) of left breast; On home oxygen therapy; Hyponatremia; Bronchomalacia; Localized edema; COPD exacerbation ([place]); Tracheobronchomalacia; Acute on chronic respiratory failure with hypoxia and hypercapnia (HCC); COPD with acute exacerbation ([place]); RSV (respiratory syncytial  virus pneumonia); Essential hypertension; and Malnutrition of moderate degree on their problem list.  Significant Hospital Events: Including procedures, antibiotic start and stop dates in addition to other pertinent events   12/25 admit. 12/26 PCCM consult. 1/2 Still on NIPPV, weaning as tolerated  Interim History / Subjective:    Objective:  Blood pressure 139/86, pulse 61, temperature (!) 97.4 F (36.3 C), temperature source Axillary, resp. rate 19, height '5\' 2"'$  (1.575 m), weight 60.5 kg, SpO2 100 %.    Vent Mode: BIPAP;PCV FiO2 (%):  [40 %] 40 % Set Rate:  [10 bmp] 10 bmp PEEP:  [6 cmH20] 6 cmH20   Intake/Output Summary (Last 24 hours) at 05/27/2022 0944 Last data filed at 05/27/2022 0900 Gross per 24 hour  Intake 478.37 ml  Output 1270 ml  Net -791.63 ml   Filed Weights   05/22/22 0000 05/23/22 0500 05/27/22 0600  Weight: 59.4 kg 58.7 kg 60.5 kg    Physical Examination: General: Acutely ill-appearing elderly female in NAD. HEENT: [place]/AT, anicteric sclera, PERRL, moist mucous membranes. Neuro: Awake, oriented x 4. Responds to verbal stimuli. Following commands consistently. Moves all 4 extremities spontaneously.  CV: RRR, no m/g/r. PULM: Breathing even and unlabored on BiPAP. Lung fields Diminished. GI: Soft, nontender, nondistended. Normoactive bowel sounds. Extremities: Trace LE edema noted. Skin: Warm/dry, intact.  Labs/imaging personally reviewed:  CXR 12/26  >emphysema. No infiltrated.  Assessment & Plan:   AECOPD - 2/2 RSV +/- bacterial component. Acute on chronic hypoxic and hypercarbic respiratory failure. RSV positive. Plan - Tapered steroids  - Cont scheduled brovana/budesonide and yupelri and PRN duoneb - Empiric CAP coverage completed (05/26/2022) - Cont Precedex for anxiety - Wean BiPAP today, if she does not tolerate, she will need intubation with plans for Trach. Will call  family today.  - PT/OT eval   Hx OSA Plan - Cont BiPAP HS and PRN for  now  Hx of Hypertension Plan - continue home Norvasc.   Best Practice (right click and "Reselect all SmartList Selections" daily)   Diet/type: NPO w/ oral meds oral intake as tolerated.  DVT prophylaxis: LMWH GI prophylaxis: PPI Lines: N/A Foley:  Yes, and it is still needed Code Status:  full code Last date of multidisciplinary goals of care discussion [12/30 at bedside]  Erma Heritage, NP-S Cct deferred to Attending

## 2022-05-27 NOTE — Progress Notes (Signed)
[place] Progress Note Patient Name: Abigail Castillo DOB: [birth year] MRN: [id number]   Date of Service  05/27/2022  HPI/Events of Note  Patient intubated and ventilated. Nursing request to change CBGs to Q 4 hours.   eICU Interventions  Will change CBGs to Q 4 hours.      Intervention Category Major Interventions: Other:  Lysle Dingwall 05/27/2022, 8:55 PM

## 2022-05-27 NOTE — Progress Notes (Signed)
Pt unable to perform NIF/VC at this time.  Pt remains on NIV.

## 2022-05-27 NOTE — Progress Notes (Signed)
Unable to perform a NIF and VC at this time. Pt recently intubated.

## 2022-05-27 NOTE — Procedures (Signed)
Intubation Procedure Note  Abigail Castillo  [id number]  July 31, 1950  Date:05/27/22  Time:2:29 PM   Provider Performing:Jolie Strohecker    Procedure: Intubation (31500)  Indication(s) Respiratory Failure  Consent Risks of the procedure as well as the alternatives and risks of each were explained to the patient and/or caregiver.  Consent for the procedure was obtained and is signed in the bedside chart   Anesthesia Etomidate and Rocuronium   Time Out Verified patient identification, verified procedure, site/side was marked, verified correct patient position, special equipment/implants available, medications/allergies/relevant history reviewed, required imaging and test results available.   Sterile Technique Usual hand hygeine, masks, and gloves were used   Procedure Description Patient positioned in bed supine.  Sedation given as noted above.  Patient was intubated with endotracheal tube using  MAC 4 .  View was Grade 1 full glottis .  Number of attempts was 1.  Colorimetric CO2 detector was consistent with tracheal placement.   Complications/Tolerance None; patient tolerated the procedure well. Chest X-ray is ordered to verify placement.   EBL Minimal   Specimen(s) None

## 2022-05-28 ENCOUNTER — Inpatient Hospital Stay (HOSPITAL_COMMUNITY): Payer: 59

## 2022-05-28 DIAGNOSIS — L8990 Pressure ulcer of unspecified site, unspecified stage: Secondary | ICD-10-CM | POA: Insufficient documentation

## 2022-05-28 DIAGNOSIS — J9602 Acute respiratory failure with hypercapnia: Principal | ICD-10-CM

## 2022-05-28 DIAGNOSIS — E440 Moderate protein-calorie malnutrition: Secondary | ICD-10-CM | POA: Diagnosis not present

## 2022-05-28 DIAGNOSIS — J121 Respiratory syncytial virus pneumonia: Secondary | ICD-10-CM | POA: Diagnosis not present

## 2022-05-28 DIAGNOSIS — J441 Chronic obstructive pulmonary disease with (acute) exacerbation: Secondary | ICD-10-CM | POA: Diagnosis not present

## 2022-05-28 LAB — POCT I-STAT 7, (LYTES, BLD GAS, ICA,H+H)
Acid-Base Excess: 10 mmol/L — ABNORMAL HIGH (ref 0.0–2.0)
Bicarbonate: 37.8 mmol/L — ABNORMAL HIGH (ref 20.0–28.0)
Calcium, Ion: 1.28 mmol/L (ref 1.15–1.40)
HCT: 36 % (ref 36.0–46.0)
Hemoglobin: 12.2 g/dL (ref 12.0–15.0)
O2 Saturation: 95 %
Patient temperature: 99.3
Potassium: 4.3 mmol/L (ref 3.5–5.1)
Sodium: 140 mmol/L (ref 135–145)
TCO2: 40 mmol/L — ABNORMAL HIGH (ref 22–32)
pCO2 arterial: 67.9 mmHg (ref 32–48)
pH, Arterial: 7.355 (ref 7.35–7.45)
pO2, Arterial: 84 mmHg (ref 83–108)

## 2022-05-28 LAB — CBC WITH DIFFERENTIAL/PLATELET
Abs Immature Granulocytes: 0.19 10*3/uL — ABNORMAL HIGH (ref 0.00–0.07)
Basophils Absolute: 0 10*3/uL (ref 0.0–0.1)
Basophils Relative: 0 %
Eosinophils Absolute: 0 10*3/uL (ref 0.0–0.5)
Eosinophils Relative: 0 %
HCT: 38.8 % (ref 36.0–46.0)
Hemoglobin: 12.2 g/dL (ref 12.0–15.0)
Immature Granulocytes: 2 %
Lymphocytes Relative: 6 %
Lymphs Abs: 0.7 10*3/uL (ref 0.7–4.0)
MCH: 31.6 pg (ref 26.0–34.0)
MCHC: 31.4 g/dL (ref 30.0–36.0)
MCV: 100.5 fL — ABNORMAL HIGH (ref 80.0–100.0)
Monocytes Absolute: 0.3 10*3/uL (ref 0.1–1.0)
Monocytes Relative: 2 %
Neutro Abs: 10.8 10*3/uL — ABNORMAL HIGH (ref 1.7–7.7)
Neutrophils Relative %: 90 %
Platelets: 145 10*3/uL — ABNORMAL LOW (ref 150–400)
RBC: 3.86 MIL/uL — ABNORMAL LOW (ref 3.87–5.11)
RDW: 14.4 % (ref 11.5–15.5)
WBC: 12 10*3/uL — ABNORMAL HIGH (ref 4.0–10.5)
nRBC: 0 % (ref 0.0–0.2)

## 2022-05-28 LAB — COMPREHENSIVE METABOLIC PANEL
ALT: 65 U/L — ABNORMAL HIGH (ref 0–44)
AST: 35 U/L (ref 15–41)
Albumin: 4.1 g/dL (ref 3.5–5.0)
Alkaline Phosphatase: 63 U/L (ref 38–126)
Anion gap: 11 (ref 5–15)
BUN: 33 mg/dL — ABNORMAL HIGH (ref 8–23)
CO2: 33 mmol/L — ABNORMAL HIGH (ref 22–32)
Calcium: 9.3 mg/dL (ref 8.9–10.3)
Chloride: 97 mmol/L — ABNORMAL LOW (ref 98–111)
Creatinine, Ser: 0.55 mg/dL (ref 0.44–1.00)
GFR, Estimated: >60 mL/min (ref >60)
Glucose, Bld: 172 mg/dL — ABNORMAL HIGH (ref 70–99)
Potassium: 4.3 mmol/L (ref 3.5–5.1)
Sodium: 141 mmol/L (ref 135–145)
Total Bilirubin: 1.7 mg/dL — ABNORMAL HIGH (ref 0.3–1.2)
Total Protein: 6.3 g/dL — ABNORMAL LOW (ref 6.5–8.1)

## 2022-05-28 LAB — MAGNESIUM
Magnesium: 1.9 mg/dL (ref 1.7–2.4)
Magnesium: 2.2 mg/dL (ref 1.7–2.4)
Magnesium: 2.2 mg/dL (ref 1.7–2.4)

## 2022-05-28 LAB — TROPONIN I (HIGH SENSITIVITY)
Troponin I (High Sensitivity): 16 ng/L (ref <18)
Troponin I (High Sensitivity): 13 ng/L (ref <18)

## 2022-05-28 LAB — GLUCOSE, CAPILLARY
Glucose-Capillary: 172 mg/dL — ABNORMAL HIGH (ref 70–99)
Glucose-Capillary: 185 mg/dL — ABNORMAL HIGH (ref 70–99)
Glucose-Capillary: 125 mg/dL — ABNORMAL HIGH (ref 70–99)
Glucose-Capillary: 124 mg/dL — ABNORMAL HIGH (ref 70–99)
Glucose-Capillary: 165 mg/dL — ABNORMAL HIGH (ref 70–99)

## 2022-05-28 LAB — PHOSPHORUS
Phosphorus: 2.8 mg/dL (ref 2.5–4.6)
Phosphorus: 2.8 mg/dL (ref 2.5–4.6)

## 2022-05-28 MED ORDER — IOHEXOL 350 MG/ML SOLN
75.0000 mL | Freq: Once | INTRAVENOUS | Status: AC | PRN
  Administered 2022-05-28: 75 mL via INTRAVENOUS

## 2022-05-28 MED ORDER — INSULIN ASPART 100 UNIT/ML IJ SOLN
0.0000 [IU] | INTRAMUSCULAR | Status: DC
  Administered 2022-05-28 – 2022-05-31 (×10): 1 [IU] via SUBCUTANEOUS
  Administered 2022-05-31: 2 [IU] via SUBCUTANEOUS
  Administered 2022-05-31 (×3): 1 [IU] via SUBCUTANEOUS
  Administered 2022-05-31: 2 [IU] via SUBCUTANEOUS
  Administered 2022-06-01 (×2): 1 [IU] via SUBCUTANEOUS
  Administered 2022-06-01: 2 [IU] via SUBCUTANEOUS
  Administered 2022-06-02 – 2022-06-08 (×21): 1 [IU] via SUBCUTANEOUS
  Administered 2022-06-09: 2 [IU] via SUBCUTANEOUS
  Administered 2022-06-09 – 2022-06-10 (×3): 1 [IU] via SUBCUTANEOUS
  Administered 2022-06-10 (×2): 2 [IU] via SUBCUTANEOUS
  Administered 2022-06-11: 1 [IU] via SUBCUTANEOUS
  Administered 2022-06-11 – 2022-06-12 (×3): 2 [IU] via SUBCUTANEOUS
  Administered 2022-06-12 – 2022-06-13 (×2): 1 [IU] via SUBCUTANEOUS
  Administered 2022-06-13: 2 [IU] via SUBCUTANEOUS
  Administered 2022-06-13 – 2022-06-14 (×2): 1 [IU] via SUBCUTANEOUS
  Administered 2022-06-14 (×2): 2 [IU] via SUBCUTANEOUS
  Administered 2022-06-14 – 2022-06-19 (×8): 1 [IU] via SUBCUTANEOUS
  Administered 2022-06-19 (×2): 2 [IU] via SUBCUTANEOUS
  Administered 2022-06-20 (×3): 1 [IU] via SUBCUTANEOUS
  Administered 2022-06-20 (×2): 2 [IU] via SUBCUTANEOUS
  Administered 2022-06-21 (×4): 1 [IU] via SUBCUTANEOUS
  Administered 2022-06-22: 2 [IU] via SUBCUTANEOUS
  Administered 2022-06-22: 1 [IU] via SUBCUTANEOUS
  Administered 2022-06-22: 2 [IU] via SUBCUTANEOUS
  Administered 2022-06-23 – 2022-06-24 (×6): 1 [IU] via SUBCUTANEOUS
  Administered 2022-06-24: 2 [IU] via SUBCUTANEOUS
  Administered 2022-06-25 – 2022-06-27 (×5): 1 [IU] via SUBCUTANEOUS
  Administered 2022-06-27: 2 [IU] via SUBCUTANEOUS
  Administered 2022-06-28 – 2022-06-29 (×4): 1 [IU] via SUBCUTANEOUS
  Administered 2022-06-29 (×2): 2 [IU] via SUBCUTANEOUS
  Administered 2022-06-30 (×2): 1 [IU] via SUBCUTANEOUS
  Administered 2022-06-30: 2 [IU] via SUBCUTANEOUS
  Administered 2022-07-01 (×3): 1 [IU] via SUBCUTANEOUS
  Administered 2022-07-01: 2 [IU] via SUBCUTANEOUS
  Administered 2022-07-02 (×3): 1 [IU] via SUBCUTANEOUS
  Administered 2022-07-02: 2 [IU] via SUBCUTANEOUS
  Administered 2022-07-03: 1 [IU] via SUBCUTANEOUS
  Administered 2022-07-03 (×2): 3 [IU] via SUBCUTANEOUS
  Administered 2022-07-04 (×2): 1 [IU] via SUBCUTANEOUS

## 2022-05-28 MED ORDER — LEVALBUTEROL HCL 0.63 MG/3ML IN NEBU
0.6300 mg | INHALATION_SOLUTION | RESPIRATORY_TRACT | Status: DC | PRN
  Administered 2022-05-31 – 2022-07-02 (×8): 0.63 mg via RESPIRATORY_TRACT
  Filled 2022-05-28 (×10): qty 3

## 2022-05-28 MED ORDER — ORAL CARE MOUTH RINSE
15.0000 mL | OROMUCOSAL | Status: DC
  Administered 2022-05-29 – 2022-05-31 (×30): 15 mL via OROMUCOSAL

## 2022-05-28 MED ORDER — ORAL CARE MOUTH RINSE: 15.0000 mL | OROMUCOSAL | Status: DC | PRN

## 2022-05-28 MED ORDER — PROSOURCE TF20 ENFIT COMPATIBL EN LIQD
60.0000 mL | Freq: Every day | ENTERAL | Status: DC
  Administered 2022-05-28 – 2022-07-04 (×36): 60 mL
  Filled 2022-05-28 (×36): qty 60

## 2022-05-28 MED ORDER — MAGNESIUM SULFATE IN D5W 1-5 GM/100ML-% IV SOLN
1.0000 g | Freq: Once | INTRAVENOUS | Status: AC
  Administered 2022-05-28: 1 g via INTRAVENOUS
  Filled 2022-05-28: qty 100

## 2022-05-28 MED ORDER — OSMOLITE 1.5 CAL PO LIQD
1000.0000 mL | ORAL | Status: DC
  Administered 2022-05-28 – 2022-06-15 (×17): 1000 mL
  Filled 2022-05-28 (×5): qty 1000

## 2022-05-28 NOTE — TOC Initial Note (Signed)
Transition of Care [facility]) - Initial/Assessment Note    Patient Details  Name: Abigail Castillo MRN: [id number] Date of Birth: [birth year]  Transition of Care [facility]) CM/SW Contact:    Milas Gain, [place] Phone Number: 05/28/2022, 5:25 PM  Clinical Narrative:                   CSW received consult for possible SNF placement/dc plans at time of discharge.Due to patients current orientation CSW LVM for patients daughter Candice  regarding PT recommendation of SNF placement at time of discharge.CSW awaiting call back. CSW to continue to follow and assist with discharge planning needs.         Patient Goals and CMS Choice            Expected Discharge Plan and Services                                              Prior Living Arrangements/Services                       Activities of Daily Living      Permission Sought/Granted                  Emotional Assessment              Admission diagnosis:  COPD exacerbation ([place]) [J44.1] Acute respiratory failure with hypercapnia ([place]) [J96.02] COPD with acute exacerbation ([place]) [J44.1] Patient Active Problem List   Diagnosis Date Noted   Acute respiratory failure with hypercapnia ([place]) 05/28/2022   Pressure injury of skin 05/28/2022   Malnutrition of moderate degree 05/20/2022   COPD with acute exacerbation ([place]) 05/19/2022   RSV (respiratory syncytial virus pneumonia) 05/19/2022   Essential hypertension 05/19/2022   Tracheobronchomalacia 04/27/2022   Acute on chronic respiratory failure with hypoxia and hypercapnia (HCC) 04/27/2022   Bronchomalacia 04/25/2022   Localized edema 04/25/2022   COPD exacerbation ([place]) 04/25/2022   Hyponatremia 04/10/2022   On home oxygen therapy 12/24/2021   Ductal carcinoma in situ (DCIS) of left breast 06/03/2021   Hepatic steatosis 05/13/2021   Elevated blood pressure reading without diagnosis of hypertension 09/14/2020   Acute on chronic respiratory failure  with hypoxia ([place]) 07/05/2019   Chronic respiratory failure ([place]) 08/25/2017   Allergic rhinitis 06/16/2017   Schizoaffective disorder ([place]) 06/30/2016   GERD (gastroesophageal reflux disease) 11/02/2015   Esophageal stricture 03/14/2015   Mild hyperlipidemia 02/03/2014   Vitamin D deficiency 02/03/2014   Encounter for routine gynecological examination 06/14/2013   Personal history of colonic polyps 03/10/2012   B12 deficiency 11/11/2010   Memory change 11/04/2010   HYPERTRIGLYCERIDEMIA 06/14/2010   Depression 05/31/2009   History of tobacco abuse 03/02/2008   Osteoporosis 04/05/2007   COPD (chronic obstructive pulmonary disease) ([place]) 03/24/2007   HOARSENESS, CHRONIC 03/24/2007   PCP:  [name], NP Pharmacy:   [facility], [place] [address] Phone: [phone] Fax: [phone]  [id number] - [name] [place] [address] Phone: [phone] Fax: [phone]  [id number] [place] [id number]: [phone]  Fax: [phone]  [facility] [address] [address] Phone: [phone] Fax: [phone]     Social Determinants of Health (SDOH) Social History: [place]: No Food Insecurity (04/19/2022)  Housing: Low Risk  (04/19/2022)  Transportation Needs: No Transportation Needs (04/19/2022)  Utilities: Not At Risk (04/19/2022)  Alcohol Screen: Low Risk  (03/26/2021)  Depression (PHQ2-9): Low Risk  (03/26/2021)  Financial Resource Strain: Low Risk  (03/26/2021)  Physical Activity: Inactive (03/26/2021)  Social Connections: Moderately Isolated (03/26/2021)  Stress: No Stress Concern Present  (03/26/2021)  Tobacco Use: Medium Risk (05/19/2022)   SDOH Interventions:     Readmission Risk Interventions     No data to display

## 2022-05-28 NOTE — Evaluation (Signed)
Physical Therapy Evaluation Patient Details Name: Abigail Castillo MRN: [id number] DOB: [birth year] Today's Date: 05/28/2022  History of Present Illness  Abigail Castillo is a 72 y.o. F recently admitted for COPD flare, discharged to SNF who returned due to SpO2 65%. Found to be  RSV+, pCO2 80, started on BiPAP.   Head CT showing possible subacute infarct of the medial left cerebellar hemisphere. Pt with COPD and chronic respiratory failure on 2-3L home O2, depression/anxiety and hx BrCA  Clinical Impression  Pt admitted with above. Recently discharged from [facility] to SNF. Pt evaluated on vent. Presents with generalized weakness, impaired cognition, decreased activity tolerance. Placed bed in chair position and worked on [employer] exercises with all extremities and core activation/strengthening. VSS on FiO2 50%, PEEP 5. Will continue to follow acutely to progress mobility as tolerated.     Recommendations for follow up therapy are one component of a multi-disciplinary discharge planning process, led by the attending physician.  Recommendations may be updated based on patient status, additional functional criteria and insurance authorization.  Follow Up Recommendations Skilled nursing-short term rehab (<3 hours/day) Can patient physically be transported by private vehicle: No    Assistance Recommended at Discharge Frequent or constant Supervision/Assistance  Patient can return home with the following  A lot of help with walking and/or transfers;A lot of help with bathing/dressing/bathroom    Equipment Recommendations None recommended by PT  Recommendations for Other Services       Functional Status Assessment Patient has had a recent decline in their functional status and demonstrates the ability to make significant improvements in function in a reasonable and predictable amount of time.     Precautions / Restrictions Precautions Precautions: Fall Precaution Comments: intubated Restrictions Weight  Bearing Restrictions: No      Mobility  Bed Mobility Overal bed mobility: Needs Assistance             General bed mobility comments: focused on unsupported sitting from bed to chair position, pt able to maintain 3x for 30 seconds - 1 minute each trail with min-max A    Transfers                   General transfer comment: deferred    Ambulation/Gait                  Stairs            Wheelchair Mobility    Modified Rankin (Stroke Patients Only)       Balance Overall balance assessment: Needs assistance Sitting-balance support: Feet supported, Bilateral upper extremity supported Sitting balance-Leahy Scale: Poor Sitting balance - Comments: min-max A for unsupported sitting at bed level                                     Pertinent Vitals/Pain Pain Assessment Pain Assessment: Faces Faces Pain Scale: Hurts a little bit Pain Location: generalized Pain Descriptors / Indicators: Discomfort, Grimacing Pain Intervention(s): Monitored during session    Home Living Family/patient expects to be discharged to:: Private residence Living Arrangements: Children Available Help at Discharge: Family;Available PRN/intermittently Type of Home: Mobile home Home Access: Stairs to enter   Entrance Stairs-Number of Steps: 6   Home Layout: One level Home Equipment: Rollator (4 wheels);Cane - single point;Wheelchair - manual Additional Comments: pt arrived from SNF after recent admission. Above info gleaned from chart    Prior Function Prior  Level of Function : Needs assist             Mobility Comments: unsure PLOF since recent d/c to SNF. from last admission: uses cane vs Rollator vs no AD depending on day, does not drive ADLs Comments: unsure PLOF since recent d/c to SNF. from last admission: pt sister does grocery shopping, pt performs IADL's     Hand Dominance   Dominant Hand: Right    Extremity/Trunk Assessment   Upper  Extremity Assessment Upper Extremity Assessment: RUE deficits/detail;LUE deficits/detail RUE Deficits / Details: continuous tremor, moving minimally against gravity. grip strength is 3/5 RUE Coordination: decreased fine motor;decreased gross motor LUE Deficits / Details: continuous tremor, moving minimally against gravity. More AROM available when compared to R. grip strength is 3/5 LUE Coordination: decreased fine motor;decreased gross motor    Lower Extremity Assessment Lower Extremity Assessment: RLE deficits/detail;LLE deficits/detail RLE Deficits / Details: +clonus, ROM WFL LLE Deficits / Details: +clonus, ROM WFL    Cervical / Trunk Assessment Cervical / Trunk Assessment: Normal  Communication   Communication: Other (comment) (intubated. nodding yes/no some of the time)  Cognition Arousal/Alertness: Awake/alert Behavior During Therapy: Flat affect, Anxious Overall Cognitive Status: Difficult to assess                                 General Comments: communicating with yes/no head nods, accurate some of the time. Oriented to self, year, and place. Followed 50% of commands with increased time and multimodal cues        General Comments General comments (skin integrity, edema, etc.): 40% FiO2, PEEP 8    Exercises General Exercises - Upper Extremity Shoulder Flexion: AAROM, Both, 5 reps, Seated Digit Composite Flexion: AAROM, Both, 5 reps, Seated General Exercises - Lower Extremity Ankle Circles/Pumps: PROM, Both, 10 reps, Supine Long Arc Quad: AAROM, Both, 5 reps, Other (comment) (bed in chair position) Heel Slides: AAROM, Both, 5 reps, Supine Other Exercises Other Exercises: 3x unsupported sitting pulling with BUEs, with 30sec - 1 min holds   Assessment/Plan    PT Assessment Patient needs continued PT services  PT Problem List Decreased activity tolerance;Decreased mobility;Cardiopulmonary status limiting activity       PT Treatment Interventions DME  instruction;Gait training;Functional mobility training;Therapeutic activities;Therapeutic exercise;Stair training;Balance training;Patient/family education    PT Goals (Current goals can be found in the Care Plan section)  Acute Rehab PT Goals Patient Stated Goal: unable PT Goal Formulation: With patient Time For Goal Achievement: 06/11/22 Potential to Achieve Goals: Fair    Frequency Min 2X/week     Co-evaluation PT/OT/SLP Co-Evaluation/Treatment: Yes Reason for Co-Treatment: For patient/therapist safety;To address functional/ADL transfers;Complexity of the patient's impairments (multi-system involvement) PT goals addressed during session: Mobility/safety with mobility OT goals addressed during session: ADL's and self-care       AM-PAC PT "6 Clicks" Mobility  Outcome Measure Help needed turning from your back to your side while in a flat bed without using bedrails?: A Little Help needed moving from lying on your back to sitting on the side of a flat bed without using bedrails?: A Lot Help needed moving to and from a bed to a chair (including a wheelchair)?: A Lot Help needed standing up from a chair using your arms (e.g., wheelchair or bedside chair)?: A Lot Help needed to walk in hospital room?: Total Help needed climbing 3-5 steps with a railing? : Total 6 Click Score: 11  End of Session Equipment Utilized During Treatment: Oxygen Activity Tolerance: No increased pain Patient left: in bed;with call bell/phone within reach;Other (comment) (bed in chair position) Nurse Communication: Mobility status PT Visit Diagnosis: Difficulty in walking, not elsewhere classified (R26.2);Unsteadiness on feet (R26.81);Other abnormalities of gait and mobility (R26.89)    Time: 1886-7737 PT Time Calculation (min) (ACUTE ONLY): 26 min   Charges:   PT Evaluation $PT Eval High Complexity: 1 High          Wyona Almas, PT, DPT Acute Rehabilitation Services Office  [phone]   Deno Etienne 05/28/2022, 12:23 PM

## 2022-05-28 NOTE — Progress Notes (Signed)
Pt unable to perform NIF/VC d/t being vented

## 2022-05-28 NOTE — Progress Notes (Signed)
Nutrition Follow-up  DOCUMENTATION CODES:   Non-severe (moderate) malnutrition in context of acute illness/injury  INTERVENTION:   Given prolonged NPO status, plan to start at low rate of 20 ml/hr, monitor electrolytes and titrate slowly to goal  Tube Feeding via Cortrak:  Osmolite 1.5 at 50 ml/h (1200 ml per day) Begin TF at rate of 20 ml/hr, titrate by 10 mL q 8 hours until goal rate of 50 Prosource TF20 60 ml 1x/d Provides 1880 kcal, 95 gm protein, 914 ml free water daily  Continue MVI daily    NUTRITION DIAGNOSIS:   Moderate Malnutrition related to acute illness (COPD exacerbation) as evidenced by mild fat depletion, mild muscle depletion, percent weight loss.  Being addressed via TF   GOAL:   Patient will meet greater than or equal to 90% of their needs  Prolonged   MONITOR:   PO intake, Supplement acceptance, Labs, Weight trends  REASON FOR ASSESSMENT:   Consult Assessment of nutrition requirement/status  ASSESSMENT:   Pt admitted from [facility] SNF with SOB r/t COPD exacerbation. PMH significant for breast cancer (2022), COPD on 2-3L home O2, depression/anxiety. Recently admitted 11/15-12/18 for COPD exacerbation.  01/02 Intubated 01/03 Cortrak  NPO, Cortrak placed today Noted pt NPO from 12/27 around 0700 until 01/01 when diet advanced to Puree. Pt had been on and off BiPap during this time. Noted plan for trach soon  Last BM on 12/30, bowel regimen initiated. May need to increase if continues without BM  Labs: reviewed, no phosphorus since admission Meds: reviewed, colace, miralax  Diet Order:   Diet Order     None       EDUCATION NEEDS:   Not appropriate for education at this time  Skin:  Skin Assessment: Reviewed RN Assessment  Last BM:  12/30 - type 6  Height:   Ht Readings from Last 1 Encounters:  05/21/22 '5\' 2"'$  (1.575 m)    Weight:   Wt Readings from Last 1 Encounters:  05/27/22 60.5 kg    BMI:  Body mass index is 24.4  kg/m.  Estimated Nutritional Needs:   Kcal:  1700-1900 kcal/d  Protein:  90-105  Fluid:  1.8-2 L/d  Kerman Passey MS, RDN, LDN, CNSC Registered Dietitian 3 Clinical Nutrition RD Pager and On-Call Pager Number Located in [place]

## 2022-05-28 NOTE — Progress Notes (Signed)
I was asked to assess Ms. Deats for change in neurological status due to unequal pupils. Pt was recently taken to CT. Pt is alert and cooperative, following commands on the ventilator. For LOC questions, I asked her to nod yes or no to the questions when answering.  NIH 1 (1b)

## 2022-05-28 NOTE — Progress Notes (Addendum)
[place] Progress Note Patient Name: Abigail Castillo DOB: Dec 14, 1950 MRN: [id number]   Date of Service  05/28/2022  HPI/Events of Note  Multiple issues: 1. ABG on 40%/PRVC 18/TV 400/P 8 = 7.355/67.9/84/37.8 2. Mg++ = 1.9 and Creatinine = 0.55 3. Troponin = 16.  eICU Interventions  Plan: Continue present ventilator management.  Will replace Mg++.      Intervention Category Major Interventions: Respiratory failure - evaluation and management  Eura Radabaugh [place] 05/28/2022, 5:35 AM

## 2022-05-28 NOTE — Progress Notes (Signed)
[place] Progress Note Patient Name: Abigail Castillo DOB: [birth year] MRN: [id number]   Date of Service  05/28/2022  HPI/Events of Note  Nursing reports pt has unequal pupils, different from her assessment last night when she had her.  R-4, L-3, brisk.  Rest of neuro exam intact  eICU Interventions  Plan: Head CT Scan w/o contrast STAT.     Intervention Category Major Interventions: Change in mental status - evaluation and management  Adileny Delon [place] 05/28/2022, 1:06 AM

## 2022-05-28 NOTE — Progress Notes (Signed)
SLP Cancellation Note  Patient Details Name: Abigail Castillo MRN: [id number] DOB: 06/06/1950   Cancelled treatment:      Pt currently on ventilator - our service will sign off.  [name] L. Tivis Ringer, MA CCC/SLP Clinical Specialist - Acute Care SLP Acute Rehabilitation Services Office number [phone]      [name] 05/28/2022, 8:36 AM

## 2022-05-28 NOTE — Progress Notes (Signed)
Pt transported from 2H06 to CT and back to 6L46 with no complications.

## 2022-05-28 NOTE — Progress Notes (Addendum)
[place] [facility] Progress Note Patient Name: Abigail Castillo DOB: March 27, 1951 MRN: [id number]   Date of Service  05/28/2022  HPI/Events of Note  Review of head CT Scan reveals: 1.    Possible subacute infarct of the medial left cerebellar hemisphere. MRI is recommended. Discussed finding with neurology who feels that this may well represent artifact and recommended  Head and Neck CTA with and without contrast.  2.    Old left frontal infarct. Chronic ischemic microangiopathic white matter changes.  eICU Interventions  Plan: Head CTA Head and Neck Scan with and without contrast STAT.  Will consult neurology.      Intervention Category Major Interventions: Change in mental status - evaluation and management  Lariya Kinzie [place] 05/28/2022, 2:26 AM

## 2022-05-28 NOTE — Progress Notes (Signed)
[place] Progress Note Patient Name: Abigail Castillo DOB: [birth year] MRN: [id number]   Date of Service  05/28/2022  HPI/Events of Note  Hyperglycemia - Nursing request to change AC/HS Novolog SSI to Q 4 hours.   eICU Interventions  Will change to Q 4 hour very sensitive Novolog SSI.     Intervention Category Major Interventions: Hyperglycemia - active titration of insulin therapy  Thu Baggett [place] 05/28/2022, 5:07 AM

## 2022-05-28 NOTE — Progress Notes (Addendum)
NAME:  Abigail Castillo, MRN:  [id number], DOB:  1950-06-02, LOS: 9 ADMISSION DATE:  05/19/2022, CONSULTATION DATE:  05/20/22 REFERRING MD:  Roger Shelter CHIEF COMPLAINT:  Dyspnea   History of Present Illness:  Abigail Castillo is a 72 y.o. female who has a PMH as below including but not limited to COPD on chronic 2-3L O2 and '10mg'$  Prednisone, OSA on nocturnal BiPAP, and who is followed by Dr. Lamonte Sakai in our office. She had recent admission 11/15 through 12/18 for AECOPD and was then discharged to [facility].  She was recently seen as an outpatient by Dr. Lamonte Sakai on 12/22 and was prescribed a slow prednisone taper with instructions to drop to '10mg'$  and continue until her next follow up appointment.  She presented back to [facility] ED 12/25 with dyspnea and hypoxia down to 65%. She was found to have hypoxic and hypercapnic respiratory failure and her RSV swab returned positive. She was placed on BiPAP and was admitted by [facility].  On 12/26, PCCM was consulted for assistance with BiPAP and COPD management.  Pertinent  Medical History:  has History of tobacco abuse; Depression; COPD (chronic obstructive pulmonary disease) ([place]); Osteoporosis; HOARSENESS, CHRONIC; HYPERTRIGLYCERIDEMIA; Memory change; B12 deficiency; Personal history of colonic polyps; Encounter for routine gynecological examination; Mild hyperlipidemia; Vitamin D deficiency; Esophageal stricture; GERD (gastroesophageal reflux disease); Schizoaffective disorder ([place]); Allergic rhinitis; Chronic respiratory failure ([place]); Acute on chronic respiratory failure with hypoxia ([place]); Elevated blood pressure reading without diagnosis of hypertension; Hepatic steatosis; Ductal carcinoma in situ (DCIS) of left breast; On home oxygen therapy; Hyponatremia; Bronchomalacia; Localized edema; COPD exacerbation ([place]); Tracheobronchomalacia; Acute on chronic respiratory failure with hypoxia and hypercapnia (HCC); COPD with acute exacerbation ([place]); RSV (respiratory syncytial  virus pneumonia); Essential hypertension; and Malnutrition of moderate degree on their problem list.  Significant Hospital Events: Including procedures, antibiotic start and stop dates in addition to other pertinent events   12/25 admit. 12/26 PCCM consult. 1/2 Failed BiPAP wean, Intubated 1/3 CT Head  Interim History / Subjective:  Overnight, Nursing staff noted pupillary changes. CT Head ordered showing Possible subacute infarct of the medial left cerebellar hemisphere. CTA recommended per Neurology with no acute findings. Also endorsed CP overnight, Trop 16, EKG showing RAA ST&T wave abnorm, consider inferior ischemia.... Hyperglycemia Objective:  Blood pressure 109/70, pulse 68, temperature 98.2 F (36.8 C), temperature source Oral, resp. rate 18, height '5\' 2"'$  (1.575 m), weight 60.5 kg, SpO2 98 %.    Vent Mode: PRVC FiO2 (%):  [30 %-60 %] 40 % Set Rate:  [10 bmp-18 bmp] 18 bmp Vt Set:  [400 mL] 400 mL PEEP:  [5 cmH20-8 cmH20] 8 cmH20 Plateau Pressure:  [17 cmH20-21 cmH20] 17 cmH20   Intake/Output Summary (Last 24 hours) at 05/28/2022 8119 Last data filed at 05/28/2022 0800 Gross per 24 hour  Intake 1409.24 ml  Output 575 ml  Net 834.24 ml   Filed Weights   05/22/22 0000 05/23/22 0500 05/27/22 0600  Weight: 59.4 kg 58.7 kg 60.5 kg   Physical Examination: General: Chronically ill-appearing elderly female in NAD. Intubated HEENT: [place]/AT, anicteric sclera, Right Pupil 3, Left Pupil 2 Reactive , moist mucous membranes. Neuro: Awake, oriented x 4. Responds to verbal stimuli. Following commands consistently.  CV: RRR, no m/g/r. PULM: Breathing even and unlabored on MV. Lung fields Diminished GI: Soft, nontender, nondistended. Normoactive bowel sounds. Extremities: Trace LE edema noted. Skin: Warm/dry, intact.  Labs/imaging personally reviewed:  CXR 12/26  >emphysema. No infiltrated.  Assessment & Plan:  AECOPD - 2/2 RSV +/- bacterial component. Acute on chronic hypoxic and  hypercarbic respiratory failure. RSV positive. Hx of OSA Plan - Intubated 05/27/2022 after failing BiPAP wean  - Cont steroids  - PRN Xopenex  - Empiric CAP coverage completed (05/26/2022) - Cont Precedex for anxiety - Trend WBC - PAD protocol - VAP protocol - Will plan for Trach in the upcoming days   [place] as indicated   Hx of Hypertension Plan - continue home Norvasc.   Hyperglycemia Plan - SSI - CBGs Q4H - Goal CBG 140-180  Best Practice (right click and "Reselect all SmartList Selections" daily)   Diet/type: NPO w/ oral meds oral intake as tolerated.  DVT prophylaxis: LMWH GI prophylaxis: PPI Lines: N/A Foley:  Yes, and it is still needed Code Status:  full code Last date of multidisciplinary goals of care discussion [12/30 at bedside]  Erma Heritage, NP-S Cct deferred to Attending

## 2022-05-28 NOTE — Procedures (Signed)
Cortrak  Person Inserting Tube:  Ranell Patrick D, RD Tube Type:  Cortrak - 43 inches Tube Size:  10 Tube Location:  Left nare Secured by: Bridle Technique Used to Measure Tube Placement:  Marking at nare/corner of mouth Cortrak Secured At:  73 cm Procedure Comments:  Cortrak Tube Team Note:  Consult received to place a Cortrak feeding tube.   X-ray is required, abdominal x-ray has been ordered by the Cortrak team. Please confirm tube placement before using the Cortrak tube.   If the tube becomes dislodged please keep the tube and contact the Cortrak team at [email or web address] for replacement.  If after hours and replacement cannot be delayed, place a NG tube and confirm placement with an abdominal x-ray.    Ranell Patrick, RD, LDN Clinical Dietitian RD pager # available in [place]  After hours/weekend pager # available in [facility]

## 2022-05-28 NOTE — Progress Notes (Signed)
Pt transported from 2H06 to CT and back to 2C00 with no complications.

## 2022-05-28 NOTE — Evaluation (Signed)
Occupational Therapy Evaluation Patient Details Name: Abigail Castillo MRN: [id number] DOB: 08-12-1950 Today's Date: 05/28/2022   History of Present Illness Mrs. Kerrick is a 72 y.o. F recently admitted for COPD flare, discharged to SNF who returned due to SpO2 65%. Found to be  RSV+, pCO2 80, started on BiPAP.   Head CT showing possible subacute infarct of the medial left cerebellar hemisphere. Pt with COPD and chronic respiratory failure on 2-3L home O2, depression/anxiety and hx BrCA   Clinical Impression   Abigail Castillo was evaluated s/p the above admission list, she was recently d/c's from [facility] to SNF. Pt intubated and unable to report functional level at SNF. Upon evaluation pt had limitations due to global weakness, impaired cognition, decreased LOA, impaired vision, poor activity tolerance, intubation, and limited AROM. Pt's LOA improved when placed upright in bed to chair position, she maintained upward gaze unless given maximal cues. BUE/LE and truncal exercises listed below with excellent tolerance while sitting upright. Overall she requires max - total A +2 for all aspects of mobility and ADLs. OT to continue to follow acutely. Recommend d/c to SNF for continued rehab.   Pt stable with FiO2 40% and PEEP 5.     Recommendations for follow up therapy are one component of a multi-disciplinary discharge planning process, led by the attending physician.  Recommendations may be updated based on patient status, additional functional criteria and insurance authorization.   Follow Up Recommendations  Skilled nursing-short term rehab (<3 hours/day)     Assistance Recommended at Discharge Frequent or constant Supervision/Assistance  Patient can return home with the following A little help with walking and/or transfers;A lot of help with bathing/dressing/bathroom;Direct supervision/assist for financial management;Assist for transportation;Help with stairs or ramp for entrance;Assistance with  cooking/housework    Functional Status Assessment  Patient has had a recent decline in their functional status and demonstrates the ability to make significant improvements in function in a reasonable and predictable amount of time.  Equipment Recommendations  Other (comment)    Recommendations for Other Services       Precautions / Restrictions Precautions Precautions: Fall Precaution Comments: intubated Restrictions Weight Bearing Restrictions: No      Mobility Bed Mobility Overal bed mobility: Needs Assistance             General bed mobility comments: focused on unsupported sitting from bed to chair position, pt able to maintain 3x for 30 seconds - 1 minute each trail with min-max A    Transfers                   General transfer comment: deferred      Balance Overall balance assessment: Needs assistance Sitting-balance support: Feet supported, Bilateral upper extremity supported Sitting balance-Leahy Scale: Poor Sitting balance - Comments: min-max A for unsupported sitting at bed level                                   ADL either performed or assessed with clinical judgement   ADL Overall ADL's : Needs assistance/impaired Eating/Feeding: NPO   Grooming: Maximal assistance Grooming Details (indicate cue type and reason): hadn over hand, pt reaching with RUE to itch forehead Upper Body Bathing: Maximal assistance;Sitting   Lower Body Bathing: Total assistance   Upper Body Dressing : Maximal assistance;Sitting   Lower Body Dressing: Total assistance   Toilet Transfer: Total assistance   Toileting- Clothing Manipulation and Hygiene: Total  assistance       Functional mobility during ADLs: Maximal assistance General ADL Comments: evaluated complete with bed in chair position     Vision Baseline Vision/History: 0 No visual deficits Vision Assessment?: Vision impaired- to be further tested in functional context Additional  Comments: needs assessment, maintains upward gaze. Able to minimally track L&R with maximal stimulation. mild nystagmus noted     Perception Perception Perception Tested?: No   Praxis Praxis Praxis tested?: Not tested    Pertinent Vitals/Pain Pain Assessment Pain Assessment: Faces Faces Pain Scale: Hurts a little bit Pain Location: generalized Pain Descriptors / Indicators: Discomfort, Grimacing Pain Intervention(s): Monitored during session     Hand Dominance Right   Extremity/Trunk Assessment Upper Extremity Assessment Upper Extremity Assessment: RUE deficits/detail;LUE deficits/detail RUE Deficits / Details: continuous tremor, moving minimally against gravity. grip strength is 3/5 RUE Coordination: decreased fine motor;decreased gross motor LUE Deficits / Details: continuous tremor, moving minimally against gravity. More AROM available when compared to R. grip strength is 3/5 LUE Coordination: decreased fine motor;decreased gross motor   Lower Extremity Assessment Lower Extremity Assessment: Defer to PT evaluation   Cervical / Trunk Assessment Cervical / Trunk Assessment: Normal   Communication Communication Communication: Other (comment) (intubated. nodding yes/no some of the time)   Cognition Arousal/Alertness: Awake/alert Behavior During Therapy: Flat affect, Anxious Overall Cognitive Status: Difficult to assess                                 General Comments: communicating with yes/no head nods, accurate some of the time. Oriented to self, year, and place. Followed 50% of commands with increased time and multimodal cues     General Comments  40% FiO2, PEEP 8    Exercises Exercises: General Upper Extremity General Exercises - Upper Extremity Shoulder Flexion: AAROM, Both, 5 reps, Seated Digit Composite Flexion: AAROM, Both, 5 reps, Seated Other Exercises Other Exercises: 3x unsupported sitting pulling with BUEs, with 30sec - 1 min holds    Shoulder Instructions      Home Living Family/patient expects to be discharged to:: Private residence Living Arrangements: Children Available Help at Discharge: Family;Available PRN/intermittently Type of Home: Mobile home Home Access: Stairs to enter Entrance Stairs-Number of Steps: 6   Home Layout: One level     Bathroom Shower/Tub: Teacher, early years/pre: Standard     Home Equipment: Rollator (4 wheels);Cane - single point;Wheelchair - manual   Additional Comments: pt arrived from SNF after recent admission. Above info gleaned from chart      Prior Functioning/Environment Prior Level of Function : Needs assist             Mobility Comments: unsure PLOF since recent d/c to SNF. from last admission: uses cane vs Rollator vs no AD depending on day, does not drive ADLs Comments: unsure PLOF since recent d/c to SNF. from last admission: pt sister does grocery shopping, pt performs IADL's        OT Problem List: Decreased strength;Decreased range of motion;Decreased activity tolerance;Impaired balance (sitting and/or standing);Decreased safety awareness;Decreased knowledge of use of DME or AE;Decreased knowledge of precautions;Pain      OT Treatment/Interventions: Self-care/ADL training;Therapeutic exercise;DME and/or AE instruction;Therapeutic activities;Patient/family education;Balance training    OT Goals(Current goals can be found in the care plan section) Acute Rehab OT Goals Patient Stated Goal: unable to state OT Goal Formulation: With patient Time For Goal Achievement: 06/11/22 Potential to Achieve  Goals: Good  OT Frequency: Min 2X/week    Co-evaluation PT/OT/SLP Co-Evaluation/Treatment: Yes Reason for Co-Treatment: Complexity of the patient's impairments (multi-system involvement);For patient/therapist safety;To address functional/ADL transfers   OT goals addressed during session: ADL's and self-care      AM-PAC OT "6 Clicks" Daily Activity      Outcome Measure Help from another person eating meals?: Total Help from another person taking care of personal grooming?: A Lot Help from another person toileting, which includes using toliet, bedpan, or urinal?: Total Help from another person bathing (including washing, rinsing, drying)?: A Lot Help from another person to put on and taking off regular upper body clothing?: A Lot Help from another person to put on and taking off regular lower body clothing?: Total 6 Click Score: 9   End of Session Nurse Communication: Mobility status  Activity Tolerance: Patient tolerated treatment well Patient left: in bed;with call bell/phone within reach;with bed alarm set  OT Visit Diagnosis: Unsteadiness on feet (R26.81);Other abnormalities of gait and mobility (R26.89);Muscle weakness (generalized) (M62.81)                Time: 1884-1660 OT Time Calculation (min): 27 min Charges:  OT General Charges $OT Visit: 1 Visit OT Evaluation $OT Eval Moderate Complexity: 1 Mod    Jnyah Brazee D Causey 05/28/2022, 11:52 AM

## 2022-05-28 NOTE — Progress Notes (Signed)
[facility] Progress Note Patient Name: Abigail Castillo DOB: 1950/07/06 MRN: [id number]   Date of Service  05/28/2022  HPI/Events of Note  Patient c/o "chest pain" described as tight. Patient states that feels like AECOPD. EKG reveals sinus tachycardia (HR = 119),  right atrial enlargement ST & T wave abnormality, consider inferior ischemia. Demand ischemia? Given elevated HR will change Albuterol rescue Neb to Xopenex new.   eICU Interventions  Plan: D/C Albuterol neb PRN.  Xopenex 0.63 mg via neb Q 3 hours PRN SOB or wheezing.  Cycle troponin.      Intervention Category Major Interventions: Other:  Aiyla Baucom Cornelia Copa 05/28/2022, 3:59 AM

## 2022-05-29 ENCOUNTER — Inpatient Hospital Stay (HOSPITAL_COMMUNITY): Payer: 59

## 2022-05-29 ENCOUNTER — Other Ambulatory Visit: Payer: Self-pay | Admitting: Family

## 2022-05-29 DIAGNOSIS — E440 Moderate protein-calorie malnutrition: Secondary | ICD-10-CM | POA: Diagnosis not present

## 2022-05-29 DIAGNOSIS — J441 Chronic obstructive pulmonary disease with (acute) exacerbation: Secondary | ICD-10-CM | POA: Diagnosis not present

## 2022-05-29 DIAGNOSIS — J121 Respiratory syncytial virus pneumonia: Secondary | ICD-10-CM | POA: Diagnosis not present

## 2022-05-29 DIAGNOSIS — J9621 Acute and chronic respiratory failure with hypoxia: Secondary | ICD-10-CM | POA: Diagnosis not present

## 2022-05-29 DIAGNOSIS — J9602 Acute respiratory failure with hypercapnia: Secondary | ICD-10-CM | POA: Diagnosis not present

## 2022-05-29 LAB — BASIC METABOLIC PANEL
Anion gap: 8 (ref 5–15)
BUN: 33 mg/dL — ABNORMAL HIGH (ref 8–23)
CO2: 32 mmol/L (ref 22–32)
Calcium: 9 mg/dL (ref 8.9–10.3)
Chloride: 98 mmol/L (ref 98–111)
Creatinine, Ser: 0.49 mg/dL (ref 0.44–1.00)
GFR, Estimated: >60 mL/min (ref >60)
Glucose, Bld: 163 mg/dL — ABNORMAL HIGH (ref 70–99)
Potassium: 3.6 mmol/L (ref 3.5–5.1)
Sodium: 138 mmol/L (ref 135–145)

## 2022-05-29 LAB — CBC
HCT: 35.9 % — ABNORMAL LOW (ref 36.0–46.0)
Hemoglobin: 11.1 g/dL — ABNORMAL LOW (ref 12.0–15.0)
MCH: 31.3 pg (ref 26.0–34.0)
MCHC: 30.9 g/dL (ref 30.0–36.0)
MCV: 101.1 fL — ABNORMAL HIGH (ref 80.0–100.0)
Platelets: 106 10*3/uL — ABNORMAL LOW (ref 150–400)
RBC: 3.55 MIL/uL — ABNORMAL LOW (ref 3.87–5.11)
RDW: 14.5 % (ref 11.5–15.5)
WBC: 10.4 10*3/uL (ref 4.0–10.5)
nRBC: 0 % (ref 0.0–0.2)

## 2022-05-29 LAB — GLUCOSE, CAPILLARY
Glucose-Capillary: 122 mg/dL — ABNORMAL HIGH (ref 70–99)
Glucose-Capillary: 133 mg/dL — ABNORMAL HIGH (ref 70–99)
Glucose-Capillary: 155 mg/dL — ABNORMAL HIGH (ref 70–99)
Glucose-Capillary: 195 mg/dL — ABNORMAL HIGH (ref 70–99)
Glucose-Capillary: 197 mg/dL — ABNORMAL HIGH (ref 70–99)
Glucose-Capillary: 210 mg/dL — ABNORMAL HIGH (ref 70–99)
Glucose-Capillary: 91 mg/dL (ref 70–99)

## 2022-05-29 LAB — MAGNESIUM
Magnesium: 2.1 mg/dL (ref 1.7–2.4)
Magnesium: 2.2 mg/dL (ref 1.7–2.4)

## 2022-05-29 LAB — PHOSPHORUS
Phosphorus: 2.8 mg/dL (ref 2.5–4.6)
Phosphorus: 2.7 mg/dL (ref 2.5–4.6)

## 2022-05-29 MED ORDER — ROCURONIUM BROMIDE 50 MG/5ML IV SOLN
100.0000 mg | Freq: Once | INTRAVENOUS | Status: DC
  Filled 2022-05-29: qty 10

## 2022-05-29 MED ORDER — MIDAZOLAM HCL 2 MG/2ML IJ SOLN
2.0000 mg | Freq: Once | INTRAMUSCULAR | Status: AC
  Administered 2022-05-29: 1 mg via INTRAVENOUS

## 2022-05-29 MED ORDER — ENOXAPARIN SODIUM 40 MG/0.4ML IJ SOSY
40.0000 mg | PREFILLED_SYRINGE | Freq: Every day | INTRAMUSCULAR | Status: DC
  Administered 2022-05-29 – 2022-05-30 (×2): 40 mg via SUBCUTANEOUS
  Filled 2022-05-29 (×2): qty 0.4

## 2022-05-29 MED ORDER — LIDOCAINE-EPINEPHRINE (PF) 1 %-1:200000 IJ SOLN
20.0000 mL | Freq: Once | INTRAMUSCULAR | Status: DC
  Filled 2022-05-29: qty 20

## 2022-05-29 MED ORDER — ETOMIDATE 2 MG/ML IV SOLN
20.0000 mg | Freq: Once | INTRAVENOUS | Status: AC
  Administered 2022-05-29: 20 mg via INTRAVENOUS
  Filled 2022-05-29: qty 10

## 2022-05-29 MED ORDER — FENTANYL CITRATE PF 50 MCG/ML IJ SOSY
200.0000 ug | PREFILLED_SYRINGE | Freq: Once | INTRAMUSCULAR | Status: AC
  Administered 2022-05-29: 200 ug via INTRAVENOUS
  Filled 2022-05-29: qty 4

## 2022-05-29 MED ORDER — PHENYLEPHRINE 80 MCG/ML (10ML) SYRINGE FOR IV PUSH (FOR BLOOD PRESSURE SUPPORT)
PREFILLED_SYRINGE | INTRAVENOUS | Status: AC
  Filled 2022-05-29: qty 10

## 2022-05-29 MED ORDER — MIDAZOLAM HCL 2 MG/2ML IJ SOLN
5.0000 mg | Freq: Once | INTRAMUSCULAR | Status: AC
  Administered 2022-05-29: 5 mg via INTRAVENOUS
  Filled 2022-05-29: qty 6

## 2022-05-29 MED ORDER — LIDOCAINE-EPINEPHRINE (PF) 2 %-1:200000 IJ SOLN
20.0000 mL | Freq: Once | INTRAMUSCULAR | Status: AC
  Administered 2022-05-29: 20 mL via INTRADERMAL

## 2022-05-29 MED ORDER — LIDOCAINE-EPINEPHRINE (PF) 1.5 %-1:200000 IJ SOLN: 30.0000 mL | Freq: Once | INTRAMUSCULAR | Status: DC

## 2022-05-29 MED ORDER — ROCURONIUM BROMIDE 10 MG/ML (PF) SYRINGE
PREFILLED_SYRINGE | Freq: Once | INTRAVENOUS | Status: AC
  Administered 2022-05-29: 100 mg via INTRAVENOUS
  Filled 2022-05-29: qty 10

## 2022-05-29 MED ORDER — SORBITOL 70 % SOLN
30.0000 mL | Freq: Once | Status: AC
  Administered 2022-05-29: 30 mL
  Filled 2022-05-29: qty 30

## 2022-05-29 NOTE — Procedures (Signed)
Diagnostic Bronchoscopy  Abigail Castillo  [id number]  [birth year]  Date:05/29/22  Time:2:25 PM   Provider Performing:Pete E Kary Kos   Procedure: Diagnostic Bronchoscopy (40370)  Indication(s) Assist with direct visualization of tracheostomy placement  Consent Risks of the procedure as well as the alternatives and risks of each were explained to the patient and/or caregiver.  Consent for the procedure was obtained.   Anesthesia See separate tracheostomy note   Time Out Verified patient identification, verified procedure, site/side was marked, verified correct patient position, special equipment/implants available, medications/allergies/relevant history reviewed, required imaging and test results available.   Sterile Technique Usual hand hygiene, masks, gowns, and gloves were used   Procedure Description Bronchoscope advanced through endotracheal tube and into airway.  After suctioning out tracheal secretions, bronchoscope used to provide direct visualization of tracheostomy placement.   Complications/Tolerance None; patient tolerated the procedure well.   EBL None  Specimen(s) None   [name] ACNP-BC [place] Pager # [phone] OR # [phone] if no answer

## 2022-05-29 NOTE — Progress Notes (Signed)
Assisted MD with bedside Trach.  Pt tolerated well. Preoxygenated with 100%FIO2.

## 2022-05-29 NOTE — Procedures (Signed)
Percutaneous Tracheostomy Procedure Note   ALYSAH CARTON  [id number]  02-03-1951  Date:05/29/22  Time:3:33 PM   Provider Performing:Loriann Bosserman  Procedure: Percutaneous Tracheostomy with Bronchoscopic Guidance (31600)  Indication(s) Acute respiratory failure  Consent Risks of the procedure as well as the alternatives and risks of each were explained to the patient and/or caregiver.  Consent for the procedure was obtained.  Anesthesia Etomidate, Versed, Fentanyl, Vecuronium   Time Out Verified patient identification, verified procedure, site/side was marked, verified correct patient position, special equipment/implants available, medications/allergies/relevant history reviewed, required imaging and test results available.   Sterile Technique Maximal sterile technique including sterile barrier drape, hand hygiene, sterile gown, sterile gloves, mask, hair covering.    Procedure Description Appropriate anatomy identified by palpation.  Patient's neck prepped and draped in sterile fashion.  1% lidocaine with epinephrine was used to anesthetize skin overlying neck.  1.5cm incision made and blunt dissection performed until tracheal rings could be easily palpated.   Then a size 6 Shiley tracheostomy was placed under bronchoscopic visualization using usual Seldinger technique and serial dilation.   Bronchoscope confirmed placement above the carina.  Tracheostomy was sutured in place with adhesive pad to protect skin under pressure.    Patient connected to ventilator.   Complications/Tolerance None; patient tolerated the procedure well. Chest X-ray is ordered to confirm no post-procedural complication.   EBL Minimal   Specimen(s) None

## 2022-05-29 NOTE — TOC Progression Note (Signed)
Transition of Care [facility]) - Progression Note    Patient Details  Name: Abigail Castillo MRN: [id number] Date of Birth: Jun 10, 1950  Transition of [facility]) CM/SW Contact  Erenest Rasher, RN Phone Number: [phone] 05/29/2022, 12:10 PM  Clinical Narrative:     HF TOC CM contacted dtr for referral to LTAC and offered choice. Dtr requested [facility]. Contacted LTAC rep, Anderson Malta with new referral.   Expected Discharge Plan: Long Term Acute Care (LTAC) Barriers to Discharge: Continued Medical Work up  Expected Discharge Plan and Services     Post Acute Care Choice: Long Term Acute Care (LTAC)        Social Determinants of Health (SDOH) Interventions SDOH Screenings   Food Insecurity: No Food Insecurity (04/19/2022)  Housing: Low Risk  (04/19/2022)  Transportation Needs: No Transportation Needs (04/19/2022)  Utilities: Not At Risk (04/19/2022)  Alcohol Screen: Low Risk  (03/26/2021)  Depression (PHQ2-9): Low Risk  (03/26/2021)  Financial Resource Strain: Low Risk  (03/26/2021)  Physical Activity: Inactive (03/26/2021)  Social Connections: Moderately Isolated (03/26/2021)  Stress: No Stress Concern Present (03/26/2021)  Tobacco Use: Medium Risk (05/19/2022)    Readmission Risk Interventions     No data to display

## 2022-05-29 NOTE — Progress Notes (Addendum)
NAME:  Abigail Castillo, MRN:  [id number], DOB:  Feb 25, 1951, LOS: 9 ADMISSION DATE:  05/19/2022, CONSULTATION DATE:  05/20/22 REFERRING MD:  Roger Shelter CHIEF COMPLAINT:  Dyspnea   History of Present Illness:  Abigail Castillo is a 72 y.o. female who has a PMH as below including but not limited to COPD on chronic 2-3L O2 and '10mg'$  Prednisone, OSA on nocturnal BiPAP, and who is followed by Dr. Lamonte Sakai in our office. She had recent admission 11/15 through 12/18 for AECOPD and was then discharged to [facility].  She was recently seen as an outpatient by Dr. Lamonte Sakai on 12/22 and was prescribed a slow prednisone taper with instructions to drop to '10mg'$  and continue until her next follow up appointment.  She presented back to [facility] ED 12/25 with dyspnea and hypoxia down to 65%. She was found to have hypoxic and hypercapnic respiratory failure and her RSV swab returned positive. She was placed on BiPAP and was admitted by [facility].  On 12/26, PCCM was consulted for assistance with BiPAP and COPD management.  Pertinent  Medical History:  has History of tobacco abuse; Depression; COPD (chronic obstructive pulmonary disease) ([place]); Osteoporosis; HOARSENESS, CHRONIC; HYPERTRIGLYCERIDEMIA; Memory change; B12 deficiency; Personal history of colonic polyps; Encounter for routine gynecological examination; Mild hyperlipidemia; Vitamin D deficiency; Esophageal stricture; GERD (gastroesophageal reflux disease); Schizoaffective disorder ([place]); Allergic rhinitis; Chronic respiratory failure ([place]); Acute on chronic respiratory failure with hypoxia ([place]); Elevated blood pressure reading without diagnosis of hypertension; Hepatic steatosis; Ductal carcinoma in situ (DCIS) of left breast; On home oxygen therapy; Hyponatremia; Bronchomalacia; Localized edema; COPD exacerbation ([place]); Tracheobronchomalacia; Acute on chronic respiratory failure with hypoxia and hypercapnia (HCC); COPD with acute exacerbation ([place]); RSV (respiratory syncytial  virus pneumonia); Essential hypertension; and Malnutrition of moderate degree on their problem list.  Significant Hospital Events: Including procedures, antibiotic start and stop dates in addition to other pertinent events   12/25 admit. 12/26 PCCM consult. 1/2 Failed BiPAP wean, Intubated 1/3 CT Head negative was completed for observed pupillary change 1/4 tracheostomy  Interim History / Subjective:  Doing well no acute events overnight Objective:  Blood pressure 109/70, pulse 68, temperature 98.2 F (36.8 C), temperature source Oral, resp. rate 18, height '5\' 2"'$  (1.575 m), weight 60.5 kg, SpO2 98 %.    Vent Mode: PRVC FiO2 (%):  [30 %-60 %] 40 % Set Rate:  [10 bmp-18 bmp] 18 bmp Vt Set:  [400 mL] 400 mL PEEP:  [5 cmH20-8 cmH20] 8 cmH20 Plateau Pressure:  [17 cmH20-21 cmH20] 17 cmH20   Intake/Output Summary (Last 24 hours) at 05/28/2022 4332 Last data filed at 05/28/2022 0800 Gross per 24 hour  Intake 1409.24 ml  Output 575 ml  Net 834.24 ml   Filed Weights   05/22/22 0000 05/23/22 0500 05/27/22 0600  Weight: 59.4 kg 58.7 kg 60.5 kg   Physical Examination: General chronically ill 72 year old female currently resting in bed she is on full ventilator support HEENT normocephalic atraumatic no jugular venous distention Pulmonary: Diminished throughout no acute assessor use on full support Cardiac: Regular rate and rhythm Abdomen: Soft nontender Extremities warm warm dry brisk capillary refill Neuro: Awake follows commands no focal deficits.  Labs/imaging personally reviewed:  CXR 12/26  >emphysema. No infiltrated.  Assessment & Plan:   AECOPD - 2/2 RSV +/- bacterial component. Acute on chronic hypoxic and hypercarbic respiratory failure. RSV positive. Hx of OSA Plan Continue to taper steroids Continue scheduled bronchodilators VAP bundle PAD protocol RASS goal 0 to -1 Trach later  today We will ask TOC to evaluate for LTAC  Hx of Hypertension Plan Norvasc on  hold  Hyperglycemia Plan Sliding scale insulin Blood glucose goal 140-180 Likely need to add basal coverage once tube feeds resumed   Best Practice (right click and "Reselect all SmartList Selections" daily)   Diet/type: NPO w/ oral meds oral intake as tolerated.  DVT prophylaxis: LMWH GI prophylaxis: PPI Lines: N/A Foley:  Yes, and it is still needed Code Status:  full code Last date of multidisciplinary goals of care discussion [12/30 at bedside]  My cct 32 min  [name] ACNP-BC [place] Pager # [phone] OR # [phone] if no answer     Critical care attending attestation note: I agree with the Advanced Practitioner's note, impression, and recommendations as outlined. I have taken an independent interval history, reviewed the chart and examined the patient. The following reflects my medical decision making and independent critical care time   Synopsis of assessment and plan: 72 year old female here with acute on chronic hypoxic/hypercapnic respiratory failure in the setting of COPD exacerbation, complicated with RSV pneumonia and superimposed bacterial pneumonia  No overnight event Remains on full support mechanical ventilation   Physical exam: General: Critically ill-appearing female, orally intubated HEENT: [place]/AT, eyes anicteric.  ETT and OGT in place Neuro: Sedated, not following commands.  Eyes are closed.  Pupils 3 mm bilateral reactive to light Chest: Reduced air entry all over, no wheezes or rhonchi Heart: Regular rate and rhythm, no murmurs or gallops Abdomen: Soft, nontender, nondistended, bowel sounds present Skin: No rash  Labs and images were reviewed  Assessment and plan: Acute on chronic hypoxic/hypercapnic respiratory failure RSV pneumonia with superimposed bacterial pneumonia Acute COPD exacerbation OSA Moderate protein calorie malnutrition Hypertension Hypomagnesemia Possible left-sided cerebellar Subacute  stroke  Continue lung protective ventilation VAP prevention bundle in place PAD protocol with propofol and fentanyl Per infectious control droplet precautions are off Continue IV antibiotics Continue tapering steroid and nebs Continue dietary supplements Blood pressure is better controlled Continue aggressive electrolyte supplement MRI brain is pending  This patient is critically ill with multiple organ system failure which requires frequent high complexity decision making, assessment, support, evaluation, and titration of therapies. This was completed through the application of advanced monitoring technologies and extensive interpretation of multiple databases.  During this encounter critical care time was devoted to patient care services described in this note for 33 minutes.    Jacky Kindle, MD [place] See Amion for pager If no response to pager, please call [phone] until 7pm After 7pm, Please call E-link [phone]   05/29/2022, 1:38 PM

## 2022-05-30 ENCOUNTER — Other Ambulatory Visit: Payer: Self-pay

## 2022-05-30 ENCOUNTER — Inpatient Hospital Stay (HOSPITAL_COMMUNITY): Payer: 59

## 2022-05-30 DIAGNOSIS — J9602 Acute respiratory failure with hypercapnia: Secondary | ICD-10-CM | POA: Diagnosis not present

## 2022-05-30 DIAGNOSIS — J9622 Acute and chronic respiratory failure with hypercapnia: Secondary | ICD-10-CM | POA: Diagnosis not present

## 2022-05-30 DIAGNOSIS — J441 Chronic obstructive pulmonary disease with (acute) exacerbation: Secondary | ICD-10-CM | POA: Diagnosis not present

## 2022-05-30 DIAGNOSIS — J9621 Acute and chronic respiratory failure with hypoxia: Secondary | ICD-10-CM | POA: Diagnosis not present

## 2022-05-30 LAB — CBC WITH DIFFERENTIAL/PLATELET
Abs Immature Granulocytes: 0.17 10*3/uL — ABNORMAL HIGH (ref 0.00–0.07)
Basophils Absolute: 0 10*3/uL (ref 0.0–0.1)
Basophils Relative: 0 %
Eosinophils Absolute: 0 10*3/uL (ref 0.0–0.5)
Eosinophils Relative: 0 %
HCT: 37.8 % (ref 36.0–46.0)
Hemoglobin: 11.7 g/dL — ABNORMAL LOW (ref 12.0–15.0)
Immature Granulocytes: 1 %
Lymphocytes Relative: 2 %
Lymphs Abs: 0.3 10*3/uL — ABNORMAL LOW (ref 0.7–4.0)
MCH: 31.5 pg (ref 26.0–34.0)
MCHC: 31 g/dL (ref 30.0–36.0)
MCV: 101.6 fL — ABNORMAL HIGH (ref 80.0–100.0)
Monocytes Absolute: 0.6 10*3/uL (ref 0.1–1.0)
Monocytes Relative: 4 %
Neutro Abs: 12 10*3/uL — ABNORMAL HIGH (ref 1.7–7.7)
Neutrophils Relative %: 93 %
Platelets: 132 10*3/uL — ABNORMAL LOW (ref 150–400)
RBC: 3.72 MIL/uL — ABNORMAL LOW (ref 3.87–5.11)
RDW: 14.6 % (ref 11.5–15.5)
WBC: 13.1 10*3/uL — ABNORMAL HIGH (ref 4.0–10.5)
nRBC: 0 % (ref 0.0–0.2)

## 2022-05-30 LAB — GLUCOSE, CAPILLARY
Glucose-Capillary: 128 mg/dL — ABNORMAL HIGH (ref 70–99)
Glucose-Capillary: 129 mg/dL — ABNORMAL HIGH (ref 70–99)
Glucose-Capillary: 135 mg/dL — ABNORMAL HIGH (ref 70–99)
Glucose-Capillary: 163 mg/dL — ABNORMAL HIGH (ref 70–99)
Glucose-Capillary: 171 mg/dL — ABNORMAL HIGH (ref 70–99)
Glucose-Capillary: 186 mg/dL — ABNORMAL HIGH (ref 70–99)
Glucose-Capillary: 199 mg/dL — ABNORMAL HIGH (ref 70–99)

## 2022-05-30 LAB — BASIC METABOLIC PANEL
Anion gap: 9 (ref 5–15)
BUN: 37 mg/dL — ABNORMAL HIGH (ref 8–23)
CO2: 31 mmol/L (ref 22–32)
Calcium: 9 mg/dL (ref 8.9–10.3)
Chloride: 106 mmol/L (ref 98–111)
Creatinine, Ser: 0.47 mg/dL (ref 0.44–1.00)
GFR, Estimated: >60 mL/min (ref >60)
Glucose, Bld: 202 mg/dL — ABNORMAL HIGH (ref 70–99)
Potassium: 4.5 mmol/L (ref 3.5–5.1)
Sodium: 146 mmol/L — ABNORMAL HIGH (ref 135–145)

## 2022-05-30 MED ORDER — INSULIN GLARGINE-YFGN 100 UNIT/ML ~~LOC~~ SOLN
5.0000 [IU] | Freq: Every day | SUBCUTANEOUS | Status: DC
  Administered 2022-05-30: 5 [IU] via SUBCUTANEOUS
  Filled 2022-05-30 (×2): qty 0.05

## 2022-05-30 MED ORDER — FREE WATER
100.0000 mL | Freq: Four times a day (QID) | Status: DC
  Administered 2022-05-30 – 2022-06-02 (×12): 100 mL

## 2022-05-30 MED ORDER — IPRATROPIUM-ALBUTEROL 0.5-2.5 (3) MG/3ML IN SOLN: 3.0000 mL | Freq: Four times a day (QID) | RESPIRATORY_TRACT | Status: DC | PRN

## 2022-05-30 MED ORDER — SORBITOL 70 % SOLN
30.0000 mL | Freq: Once | Status: AC
  Administered 2022-05-30: 30 mL
  Filled 2022-05-30: qty 30

## 2022-05-30 NOTE — TOC Progression Note (Addendum)
Transition of Care [facility]) - Progression Note    Patient Details  Name: Abigail Castillo MRN: [id number] Date of Birth: January 28, 1951  [facility]) CM/SW Contact  Erenest Rasher, RN Phone Number: [phone] 05/30/2022, 12:53 PM  Clinical Narrative:    Received call from Amy Halferty,# [phone], [place] Medicare CM and states she will escalate LTAC referral once it come through.    HF TOC CM received notification from [place], [place] Ginger has been submitted. Waiting approval. Attending updated.   Expected Discharge Plan: Long Term Acute Care (LTAC) Barriers to Discharge: Continued Medical Work up  Expected Discharge Plan and Services     Post Acute Care Choice: Long Term Acute Care (LTAC)     Social Determinants of Health (SDOH) Interventions SDOH Screenings   Food Insecurity: No Food Insecurity (05/30/2022)  Housing: Low Risk  (05/30/2022)  Transportation Needs: No Transportation Needs (05/30/2022)  Utilities: Not At Risk (05/30/2022)  Alcohol Screen: Low Risk  (03/26/2021)  Depression (PHQ2-9): Low Risk  (03/26/2021)  Financial Resource Strain: Low Risk  (03/26/2021)  Physical Activity: Inactive (03/26/2021)  Social Connections: Moderately Isolated (03/26/2021)  Stress: No Stress Concern Present (03/26/2021)  Tobacco Use: Medium Risk (05/19/2022)    Readmission Risk Interventions     No data to display

## 2022-05-30 NOTE — Progress Notes (Addendum)
NAME:  Abigail Castillo, MRN:  [id number], DOB:  1951/04/10, LOS: 11 ADMISSION DATE:  05/19/2022, CONSULTATION DATE:  05/20/22 REFERRING MD:  Roger Shelter CHIEF COMPLAINT:  Dyspnea   History of Present Illness:  Abigail Castillo is a 72 y.o. female who has a PMH as below including but not limited to COPD on chronic 2-3L O2 and '10mg'$  Prednisone, OSA on nocturnal BiPAP, and who is followed by Dr. Lamonte Sakai in our office. She had recent admission 11/15 through 12/18 for AECOPD and was then discharged to [facility].  She was recently seen as an outpatient by Dr. Lamonte Sakai on 12/22 and was prescribed a slow prednisone taper with instructions to drop to '10mg'$  and continue until her next follow up appointment.  She presented back to [facility] ED 12/25 with dyspnea and hypoxia down to 65%. She was found to have hypoxic and hypercapnic respiratory failure and her RSV swab returned positive. She was placed on BiPAP and was admitted by [facility].  On 12/26, PCCM was consulted for assistance with BiPAP and COPD management.  Pertinent  Medical History:  has History of tobacco abuse; Depression; COPD (chronic obstructive pulmonary disease) ([place]); Osteoporosis; HOARSENESS, CHRONIC; HYPERTRIGLYCERIDEMIA; Memory change; B12 deficiency; Personal history of colonic polyps; Encounter for routine gynecological examination; Mild hyperlipidemia; Vitamin D deficiency; Esophageal stricture; GERD (gastroesophageal reflux disease); Schizoaffective disorder ([place]); Allergic rhinitis; Chronic respiratory failure ([place]); Acute on chronic respiratory failure with hypoxia ([place]); Elevated blood pressure reading without diagnosis of hypertension; Hepatic steatosis; Ductal carcinoma in situ (DCIS) of left breast; On home oxygen therapy; Hyponatremia; Bronchomalacia; Localized edema; COPD exacerbation ([place]); Tracheobronchomalacia; Acute on chronic respiratory failure with hypoxia and hypercapnia (HCC); COPD with acute exacerbation ([place]); RSV (respiratory syncytial  virus pneumonia); Essential hypertension; Malnutrition of moderate degree; Acute respiratory failure with hypercapnia ([place]); and Pressure injury of skin on their problem list.  Significant Hospital Events: Including procedures, antibiotic start and stop dates in addition to other pertinent events   12/25 admit. 12/26 PCCM consult. 1/2 Failed BiPAP wean, Intubated 1/3 CT Head negative was completed for observed pupillary change 1/4 tracheostomy  Interim History / Subjective:  This AM on Trach collar with mild accessory muscle use and anxiety. Remains on low dose precedex gtt   Objective:  Blood pressure 112/71, pulse (!) 125, temperature 98.6 F (37 C), resp. rate (!) 31, height '5\' 2"'$  (1.575 m), weight 58.9 kg, SpO2 95 %.    Vent Mode: CPAP;PSV FiO2 (%):  [40 %-100 %] 40 % Set Rate:  [18 bmp] 18 bmp Vt Set:  [400 mL] 400 mL PEEP:  [5 cmH20] 5 cmH20 Pressure Support:  [5 cmH20-10 cmH20] 5 cmH20 Plateau Pressure:  [16 cmH20] 16 cmH20   Intake/Output Summary (Last 24 hours) at 05/30/2022 8768 Last data filed at 05/30/2022 1157 Gross per 24 hour  Intake 1459.64 ml  Output 572 ml  Net 887.64 ml   Filed Weights   05/27/22 0600 05/29/22 0151 05/30/22 0333  Weight: 60.5 kg 59.2 kg 58.9 kg   Physical Examination: General chronically ill older adult female, lying in bed  HEENT Trach in place > clean/dry, cortrack in place  Pulmonary: Diminished breath sounds, mild accessory muscle use  Cardiac: Tachy, no mRG  Abdomen: soft, non-tender, active bowel sounds  Extremities warm warm dry brisk capillary refill Neuro: Alert, oriented (mouths words), follows commands   Labs/imaging personally reviewed:  CXR 12/26  >emphysema. No infiltrated.   Assessment & Plan:   Acute on chronic hypoxic and hypercarbic respiratory failure  in setting of AECOPD - 2/2 RSV +/- bacterial component. Hx of OSA S/P Trach 1/4  Plan Continue steroid taper  Continue scheduled bronchodilators Trach collar as  tolerated, Vent support as needed and HS  VAP bundle Titrate fentanyl, precedex for RASS goal 0 to -1.   Anxiety  Plan Continue Zyprexa   Hx of Hypertension Plan Norvasc on hold  Hyperglycemia Plan Sliding scale insulin Blood glucose goal 140-180 Start semglee 5 units daily   CT Head 1/3 with concern for subacute infarct of medical left cerebellar hemisphere Old left frontal infarct  -Plan for MRI when medically able   Best Practice (right click and "Reselect all SmartList Selections" daily)   Diet/type: TF DVT prophylaxis: LMWH GI prophylaxis: PPI Foley:  Yes, and it is still needed Code Status:  full code Last date of multidisciplinary goals of care discussion [12/30 at bedside]  CRITICAL CARE Performed by: Omar Person   Total critical care time: 35 minutes  Critical care time was exclusive of separately billable procedures and treating other patients.  Critical care was necessary to treat or prevent imminent or life-threatening deterioration.  Critical care was time spent personally by me on the following activities: development of treatment plan with patient and/or surrogate as well as nursing, discussions with consultants, evaluation of patient's response to treatment, examination of patient, obtaining history from patient or surrogate, ordering and performing treatments and interventions, ordering and review of laboratory studies, ordering and review of radiographic studies, pulse oximetry and re-evaluation of patient's condition.

## 2022-05-30 NOTE — Progress Notes (Signed)
Occupational Therapy Treatment Patient Details Name: Abigail Castillo MRN: [id number] DOB: 1950-09-26 Today's Date: 05/30/2022   History of present illness Abigail Castillo is a 72 y.o. F recently admitted for COPD flare, discharged to SNF who returned due to SpO2 65%. Found to be  RSV+, pCO2 80, started on BiPAP.   Head CT showing possible subacute infarct of the medial left cerebellar hemisphere. Failed bipap wean on 1/2, intubated. Tracheostomy 1/4. Pt with COPD and chronic respiratory failure on 2-3L home O2, depression/anxiety and hx BrCA   OT comments  Completed bed level UB exercises x 10 reps each and one grooming task. Following commands consistently. Communicated by writing, somewhat illegible at times. Upward gaze seen upon eval resolved, pt with L upper quadrant field cut. Pt on vent support throughout. Updated d/c recommendation to [facility].   Recommendations for follow up therapy are one component of a multi-disciplinary discharge planning process, led by the attending physician.  Recommendations may be updated based on patient status, additional functional criteria and insurance authorization.    Follow Up Recommendations  OT at Long-term acute care hospital     Assistance Recommended at Discharge Frequent or constant Supervision/Assistance  Patient can return home with the following  Two people to help with walking and/or transfers;Two people to help with bathing/dressing/bathroom;Assistance with feeding;Direct supervision/assist for medications management;Assistance with cooking/housework;Direct supervision/assist for financial management;Assist for transportation;Help with stairs or ramp for entrance   Equipment Recommendations       Recommendations for Other Services      Precautions / Restrictions Precautions Precautions: Fall Precaution Comments: vent via trach       Mobility Bed Mobility                    Transfers                         Balance                                            ADL either performed or assessed with clinical judgement   ADL       Grooming: Set up;Bed level;Wash/dry hands Grooming Details (indicate cue type and reason): wiped eyes/forehead with washcloth                                    Extremity/Trunk Assessment              Vision   Additional Comments: pt with L upper quadrant field cut   Perception     Praxis      Cognition Arousal/Alertness: Awake/alert Behavior During Therapy: Flat affect Overall Cognitive Status: Difficult to assess                                 General Comments: pt following commands consistently, writing to communicate and mouthing words        Exercises Exercises: General Upper Extremity, General Lower Extremity General Exercises - Upper Extremity Shoulder Flexion: AROM, Both, 10 reps, Supine Elbow Flexion: AROM, Both, 10 reps, Supine Elbow Extension: AROM, Both, 10 reps, Supine Other Exercises Other Exercises: moderate resistance of elbow flexion and extension x 5    Shoulder Instructions  General Comments      Pertinent Vitals/ Pain       Pain Assessment Pain Assessment: Faces Faces Pain Scale: Hurts a little bit Pain Location: wrote "it hurts when I fart" Pain Descriptors / Indicators: Discomfort Pain Intervention(s): Monitored during session  Home Living                                          Prior Functioning/Environment              Frequency  Min 2X/week        Progress Toward Goals  OT Goals(current goals can now be found in the care plan section)  Progress towards OT goals: Progressing toward goals  Acute Rehab OT Goals OT Goal Formulation: With patient Time For Goal Achievement: 06/11/22 Potential to Achieve Goals: [place] Discharge plan needs to be updated    Co-evaluation                 AM-PAC OT "6 Clicks" Daily Activity      Outcome Measure   Help from another person eating meals?: Total Help from another person taking care of personal grooming?: A Lot Help from another person toileting, which includes using toliet, bedpan, or urinal?: Total Help from another person bathing (including washing, rinsing, drying)?: Total Help from another person to put on and taking off regular upper body clothing?: Total Help from another person to put on and taking off regular lower body clothing?: Total 6 Click Score: 7    End of Session    OT Visit Diagnosis: Muscle weakness (generalized) (M62.81)   Activity Tolerance Patient tolerated treatment well   Patient Left in bed;with call bell/phone within reach;with bed alarm set   Nurse Communication          Time: 0947-0962 OT Time Calculation (min): 15 min  Charges: OT General Charges $OT Visit: 1 Visit OT Treatments $Therapeutic Exercise: 8-22 mins  Abigail Castillo, OTR/L Acute Rehabilitation Services Office: [phone]  Abigail Castillo 05/30/2022, 4:03 PM

## 2022-05-30 NOTE — Progress Notes (Incomplete)
Nutrition Follow-up  DOCUMENTATION CODES:   Non-severe (moderate) malnutrition in context of acute illness/injury  INTERVENTION:   Tube Feeding via    NUTRITION DIAGNOSIS:   Moderate Malnutrition related to acute illness (COPD exacerbation) as evidenced by mild fat depletion, mild muscle depletion, percent weight loss.  ***  GOAL:   Patient will meet greater than or equal to 90% of their needs  ***  MONITOR:   PO intake, Supplement acceptance, Labs, Weight trends  REASON FOR ASSESSMENT:   Consult Assessment of nutrition requirement/status  ASSESSMENT:   Pt admitted from [facility] SNF with SOB r/t COPD exacerbation. PMH significant for breast cancer (2022), COPD on 2-3L home O2, depression/anxiety. Recently admitted 11/15-12/18 for COPD exacerbation.  1/04 Trach placed  TC this AM  Osmolite 1.5 at 30 ml/hr ***  Pt remains constipated; last BM 6 days ago and BM at that time was small.   NUTRITION - FOCUSED PHYSICAL EXAM:  {RD Focused Exam List:21252}  Diet Order:   Diet Order             Diet NPO time specified  Diet effective now                   EDUCATION NEEDS:   Not appropriate for education at this time  Skin:  Skin Assessment: Reviewed RN Assessment  Last BM:  12/30 - type 6  Height:   Ht Readings from Last 1 Encounters:  05/21/22 '5\' 2"'$  (1.575 m)    Weight:   Wt Readings from Last 1 Encounters:  05/30/22 58.9 kg    Ideal Body Weight:     BMI:  Body mass index is 23.75 kg/m.  Estimated Nutritional Needs:   Kcal:  1700-1900 kcal/d  Protein:  90-105  Fluid:  1.8-2 L/d  Kerman Passey MS, RDN, LDN, CNSC Registered Dietitian 3 Clinical Nutrition RD Pager and On-Call Pager Number Located in [place]

## 2022-05-30 NOTE — Progress Notes (Signed)
OT Cancellation Note  Patient Details Name: [name] MRN: [id number] DOB: 11-22-1950   Cancelled Treatment:    Reason Eval/Treat Not Completed: Medical issues which prohibited therapy (Pt just put back on vent support and tachycardic. Will return for bed level activities.)  [name] 05/30/2022, 11:41 AM Cleta Alberts, OTR/L [place] Office: [phone]

## 2022-05-30 NOTE — Progress Notes (Signed)
SLP Cancellation Note  Patient Details Name: Abigail Castillo MRN: [id number] DOB: 09/14/1950   Cancelled treatment:       Reason Eval/Treat Not Completed: Other (comment) Patient with new tracheostomy. Orders for SLP eval and treat for PMSV and swallowing received. Will follow pt closely for readiness for SLP interventions as appropriate.     Osie Bond., M.A. [place] Office [phone]  Secure chat preferred  05/30/2022, 8:19 AM

## 2022-05-31 ENCOUNTER — Inpatient Hospital Stay (HOSPITAL_COMMUNITY): Payer: 59

## 2022-05-31 DIAGNOSIS — J121 Respiratory syncytial virus pneumonia: Secondary | ICD-10-CM | POA: Diagnosis not present

## 2022-05-31 DIAGNOSIS — J441 Chronic obstructive pulmonary disease with (acute) exacerbation: Secondary | ICD-10-CM | POA: Diagnosis not present

## 2022-05-31 DIAGNOSIS — E440 Moderate protein-calorie malnutrition: Secondary | ICD-10-CM | POA: Diagnosis not present

## 2022-05-31 DIAGNOSIS — J9621 Acute and chronic respiratory failure with hypoxia: Secondary | ICD-10-CM | POA: Diagnosis not present

## 2022-05-31 LAB — POCT I-STAT 7, (LYTES, BLD GAS, ICA,H+H)
Acid-Base Excess: 11 mmol/L — ABNORMAL HIGH (ref 0.0–2.0)
Bicarbonate: 39.2 mmol/L — ABNORMAL HIGH (ref 20.0–28.0)
Calcium, Ion: 1.28 mmol/L (ref 1.15–1.40)
HCT: 38 % (ref 36.0–46.0)
Hemoglobin: 12.9 g/dL (ref 12.0–15.0)
O2 Saturation: 95 %
Patient temperature: 36.5
Potassium: 4.4 mmol/L (ref 3.5–5.1)
Sodium: 148 mmol/L — ABNORMAL HIGH (ref 135–145)
TCO2: 41 mmol/L — ABNORMAL HIGH (ref 22–32)
pCO2 arterial: 63.7 mmHg — ABNORMAL HIGH (ref 32–48)
pH, Arterial: 7.394 (ref 7.35–7.45)
pO2, Arterial: 78 mmHg — ABNORMAL LOW (ref 83–108)

## 2022-05-31 LAB — BRAIN NATRIURETIC PEPTIDE: B Natriuretic Peptide: 185.7 pg/mL — ABNORMAL HIGH (ref 0.0–100.0)

## 2022-05-31 LAB — CBC
HCT: 37.3 % (ref 36.0–46.0)
Hemoglobin: 11.3 g/dL — ABNORMAL LOW (ref 12.0–15.0)
MCH: 31.3 pg (ref 26.0–34.0)
MCHC: 30.3 g/dL (ref 30.0–36.0)
MCV: 103.3 fL — ABNORMAL HIGH (ref 80.0–100.0)
Platelets: 121 10*3/uL — ABNORMAL LOW (ref 150–400)
RBC: 3.61 MIL/uL — ABNORMAL LOW (ref 3.87–5.11)
RDW: 14.6 % (ref 11.5–15.5)
WBC: 10.8 10*3/uL — ABNORMAL HIGH (ref 4.0–10.5)
nRBC: 0 % (ref 0.0–0.2)

## 2022-05-31 LAB — BASIC METABOLIC PANEL
Anion gap: 9 (ref 5–15)
Anion gap: 7 (ref 5–15)
BUN: 26 mg/dL — ABNORMAL HIGH (ref 8–23)
BUN: 38 mg/dL — ABNORMAL HIGH (ref 8–23)
CO2: 35 mmol/L — ABNORMAL HIGH (ref 22–32)
CO2: 37 mmol/L — ABNORMAL HIGH (ref 22–32)
Calcium: 8.8 mg/dL — ABNORMAL LOW (ref 8.9–10.3)
Calcium: 9.2 mg/dL (ref 8.9–10.3)
Chloride: 103 mmol/L (ref 98–111)
Chloride: 104 mmol/L (ref 98–111)
Creatinine, Ser: 0.5 mg/dL (ref 0.44–1.00)
Creatinine, Ser: 0.59 mg/dL (ref 0.44–1.00)
GFR, Estimated: >60 mL/min (ref >60)
GFR, Estimated: >60 mL/min (ref >60)
Glucose, Bld: 207 mg/dL — ABNORMAL HIGH (ref 70–99)
Glucose, Bld: 191 mg/dL — ABNORMAL HIGH (ref 70–99)
Potassium: 4.5 mmol/L (ref 3.5–5.1)
Potassium: 4.6 mmol/L (ref 3.5–5.1)
Sodium: 147 mmol/L — ABNORMAL HIGH (ref 135–145)
Sodium: 148 mmol/L — ABNORMAL HIGH (ref 135–145)

## 2022-05-31 LAB — LACTIC ACID, PLASMA: Lactic Acid, Venous: 1.4 mmol/L (ref 0.5–1.9)

## 2022-05-31 LAB — TROPONIN I (HIGH SENSITIVITY)
Troponin I (High Sensitivity): 47 ng/L — ABNORMAL HIGH (ref <18)
Troponin I (High Sensitivity): 70 ng/L — ABNORMAL HIGH (ref <18)

## 2022-05-31 LAB — GLUCOSE, CAPILLARY
Glucose-Capillary: 150 mg/dL — ABNORMAL HIGH (ref 70–99)
Glucose-Capillary: 157 mg/dL — ABNORMAL HIGH (ref 70–99)
Glucose-Capillary: 177 mg/dL — ABNORMAL HIGH (ref 70–99)
Glucose-Capillary: 187 mg/dL — ABNORMAL HIGH (ref 70–99)
Glucose-Capillary: 230 mg/dL — ABNORMAL HIGH (ref 70–99)
Glucose-Capillary: 242 mg/dL — ABNORMAL HIGH (ref 70–99)

## 2022-05-31 LAB — MAGNESIUM: Magnesium: 2.3 mg/dL (ref 1.7–2.4)

## 2022-05-31 LAB — PHOSPHORUS: Phosphorus: 2.5 mg/dL (ref 2.5–4.6)

## 2022-05-31 MED ORDER — SENNOSIDES 8.8 MG/5ML PO SYRP
10.0000 mL | ORAL_SOLUTION | Freq: Every day | ORAL | Status: DC
  Administered 2022-05-31 – 2022-06-07 (×4): 10 mL
  Filled 2022-05-31 (×5): qty 10

## 2022-05-31 MED ORDER — BISACODYL 10 MG RE SUPP
10.0000 mg | Freq: Every day | RECTAL | Status: DC | PRN
  Administered 2022-05-31: 10 mg via RECTAL
  Filled 2022-05-31: qty 1

## 2022-05-31 MED ORDER — INSULIN GLARGINE-YFGN 100 UNIT/ML ~~LOC~~ SOLN
8.0000 [IU] | Freq: Every day | SUBCUTANEOUS | Status: DC
  Administered 2022-05-31 – 2022-06-01 (×2): 8 [IU] via SUBCUTANEOUS
  Filled 2022-05-31 (×2): qty 0.08

## 2022-05-31 MED ORDER — CLONAZEPAM 0.25 MG PO TBDP
0.5000 mg | ORAL_TABLET | Freq: Three times a day (TID) | ORAL | Status: DC | PRN
  Administered 2022-05-31 – 2022-06-01 (×2): 0.5 mg via ORAL
  Filled 2022-05-31 (×2): qty 2

## 2022-05-31 MED ORDER — DEXMEDETOMIDINE HCL IN NACL 400 MCG/100ML IV SOLN
0.0000 ug/kg/h | INTRAVENOUS | Status: DC
  Administered 2022-05-31: 1 ug/kg/h via INTRAVENOUS
  Administered 2022-05-31: 0.5 ug/kg/h via INTRAVENOUS
  Administered 2022-06-01: 0.7 ug/kg/h via INTRAVENOUS
  Administered 2022-06-01: 1.2 ug/kg/h via INTRAVENOUS
  Administered 2022-06-01: 0.08 ug/kg/h via INTRAVENOUS
  Administered 2022-06-02: 0.4 ug/kg/h via INTRAVENOUS
  Filled 2022-05-31 (×5): qty 100

## 2022-05-31 MED ORDER — MIDAZOLAM HCL 2 MG/2ML IJ SOLN
INTRAMUSCULAR | Status: AC
  Administered 2022-05-31: 2 mg
  Filled 2022-05-31: qty 2

## 2022-05-31 MED ORDER — SORBITOL 70 % SOLN: 30.0000 mL | Freq: Once | Status: DC

## 2022-05-31 MED ORDER — ORAL CARE MOUTH RINSE
15.0000 mL | OROMUCOSAL | Status: DC
  Administered 2022-05-31 – 2022-06-02 (×8): 15 mL via OROMUCOSAL

## 2022-05-31 MED ORDER — MIDAZOLAM HCL 2 MG/2ML IJ SOLN: 2.0000 mg | Freq: Once | INTRAMUSCULAR | Status: AC

## 2022-05-31 MED ORDER — ORAL CARE MOUTH RINSE: 15.0000 mL | OROMUCOSAL | Status: DC | PRN

## 2022-05-31 MED ORDER — IOHEXOL 350 MG/ML SOLN
75.0000 mL | Freq: Once | INTRAVENOUS | Status: AC | PRN
  Administered 2022-05-31: 75 mL via INTRAVENOUS

## 2022-05-31 MED ORDER — DEXMEDETOMIDINE HCL IN NACL 400 MCG/100ML IV SOLN
INTRAVENOUS | Status: AC
  Filled 2022-05-31: qty 100

## 2022-05-31 MED ORDER — FENTANYL 2500MCG IN NS 250ML (10MCG/ML) PREMIX INFUSION
0.0000 ug/h | INTRAVENOUS | Status: DC
  Administered 2022-06-01: 50 ug/h via INTRAVENOUS
  Filled 2022-05-31: qty 250

## 2022-05-31 MED ORDER — MIDAZOLAM HCL 2 MG/2ML IJ SOLN
INTRAMUSCULAR | Status: AC
  Administered 2022-05-31: 2 mg via INTRAVENOUS
  Filled 2022-05-31: qty 2

## 2022-05-31 MED ORDER — LEVALBUTEROL HCL 0.63 MG/3ML IN NEBU
0.6300 mg | INHALATION_SOLUTION | Freq: Four times a day (QID) | RESPIRATORY_TRACT | Status: DC
  Administered 2022-06-01 – 2022-06-02 (×8): 0.63 mg via RESPIRATORY_TRACT
  Filled 2022-05-31 (×8): qty 3

## 2022-05-31 MED ORDER — ENOXAPARIN SODIUM 40 MG/0.4ML IJ SOSY
40.0000 mg | PREFILLED_SYRINGE | Freq: Two times a day (BID) | INTRAMUSCULAR | Status: DC
  Administered 2022-05-31: 40 mg via SUBCUTANEOUS
  Filled 2022-05-31: qty 0.4

## 2022-05-31 MED ORDER — IPRATROPIUM BROMIDE 0.02 % IN SOLN
0.5000 mg | Freq: Four times a day (QID) | RESPIRATORY_TRACT | Status: DC
  Administered 2022-06-01 – 2022-06-02 (×8): 0.5 mg via RESPIRATORY_TRACT
  Filled 2022-05-31 (×8): qty 2.5

## 2022-05-31 NOTE — Progress Notes (Addendum)
NAME:  Abigail Castillo, MRN:  [id number], DOB:  13-May-1951, LOS: 12 ADMISSION DATE:  05/19/2022, CONSULTATION DATE:  05/20/22 REFERRING MD:  Roger Shelter CHIEF COMPLAINT:  Dyspnea   History of Present Illness:  Abigail Castillo is a 72 y.o. female who has a PMH as below including but not limited to COPD on chronic 2-3L O2 and '10mg'$  Prednisone, OSA on nocturnal BiPAP, and who is followed by Dr. Lamonte Sakai in our office. She had recent admission 11/15 through 12/18 for AECOPD and was then discharged to [facility].  She was recently seen as an outpatient by Dr. Lamonte Sakai on 12/22 and was prescribed a slow prednisone taper with instructions to drop to '10mg'$  and continue until her next follow up appointment.  She presented back to [facility] ED 12/25 with dyspnea and hypoxia down to 65%. She was found to have hypoxic and hypercapnic respiratory failure and her RSV swab returned positive. She was placed on BiPAP and was admitted by [facility].  On 12/26, PCCM was consulted for assistance with BiPAP and COPD management.  Pertinent  Medical History:  has History of tobacco abuse; Depression; COPD (chronic obstructive pulmonary disease) ([place]); Osteoporosis; HOARSENESS, CHRONIC; HYPERTRIGLYCERIDEMIA; Memory change; B12 deficiency; Personal history of colonic polyps; Encounter for routine gynecological examination; Mild hyperlipidemia; Vitamin D deficiency; Esophageal stricture; GERD (gastroesophageal reflux disease); Schizoaffective disorder ([place]); Allergic rhinitis; Chronic respiratory failure ([place]); Acute on chronic respiratory failure with hypoxia ([place]); Elevated blood pressure reading without diagnosis of hypertension; Hepatic steatosis; Ductal carcinoma in situ (DCIS) of left breast; On home oxygen therapy; Hyponatremia; Bronchomalacia; Localized edema; COPD exacerbation ([place]); Tracheobronchomalacia; Acute on chronic respiratory failure with hypoxia and hypercapnia (HCC); COPD with acute exacerbation ([place]); RSV (respiratory syncytial  virus pneumonia); Essential hypertension; Malnutrition of moderate degree; Acute respiratory failure with hypercapnia ([place]); and Pressure injury of skin on their problem list.  Significant Hospital Events: Including procedures, antibiotic start and stop dates in addition to other pertinent events   12/25 admit. 12/26 PCCM consult. 1/2 Failed BiPAP wean, Intubated 1/3 CT Head negative was completed for observed pupillary change 1/4 tracheostomy  Interim History / Subjective:  Still no bowel movement Tolerated trach 3 hours yesterday before was placed back on full mechanical ventilatory support Remains on Precedex for restlessness Will try trach collar again as long as patient can tolerate  Objective:  Blood pressure (!) 114/93, pulse 96, temperature 98.4 F (36.9 C), temperature source Oral, resp. rate 20, height '5\' 2"'$  (1.575 m), weight 61.4 kg, SpO2 99 %.    Vent Mode: PRVC FiO2 (%):  [40 %] 40 % Set Rate:  [18 bmp] 18 bmp Vt Set:  [400 mL] 400 mL PEEP:  [5 cmH20] 5 cmH20 Pressure Support:  [5 cmH20-8 cmH20] 8 cmH20 Plateau Pressure:  [20 cmH20] 20 cmH20   Intake/Output Summary (Last 24 hours) at 05/31/2022 0745 Last data filed at 05/31/2022 0600 Gross per 24 hour  Intake 1677.25 ml  Output 880 ml  Net 797.25 ml   Filed Weights   05/29/22 0151 05/30/22 0333 05/31/22 0344  Weight: 59.2 kg 58.9 kg 61.4 kg   Physical Examination: Physical exam: General: Acute on chronically ill-appearing female, lying on the bed HEENT: [place]/AT, eyes anicteric.  moist mucus membranes.  S/p trach Neuro: Alert, awake following commands Chest: Coarse breath sounds, no wheezes or rhonchi Heart: Regular rate and rhythm, no murmurs or gallops Abdomen: Soft, nontender, nondistended, bowel sounds present Skin: No rash  Assessment & Plan:  Acute on chronic hypoxic and hypercarbic  respiratory failure in setting of AECOPD - 2/2 RSV with superimposed bacterial infection s/p trach OSA Continue steroid  taper  Continue scheduled bronchodilators Trach collar as tolerated VAP prevention bundle Of fentanyl Discontinue Precedex  Anxiety  Continue home med, Zyprexa   Labile hypertension Norvasc on hold for now  Prediabetes with hyperglycemia in the setting of steroid Started on long-acting insulin as fingerstick ranging in 200s Continue sliding scale insulin with goal CBG 140-180  Moderate malnutrition Continue dietary supplements  CT Head 1/3 with concern for subacute infarct of medical left cerebellar hemisphere Old left frontal infarct  Plan for MRI today  Best Practice (right click and "Reselect all SmartList Selections" daily)   Diet/type: TF DVT prophylaxis: LMWH GI prophylaxis: PPI Foley: Discontinue Code Status:  full code Last date of multidisciplinary goals of care discussion [1/3: With daughter at bedside, plan was to proceed with tracheostomy and full scope of care]   This patient is critically ill with multiple organ system failure which requires frequent high complexity decision making, assessment, support, evaluation, and titration of therapies. This was completed through the application of advanced monitoring technologies and extensive interpretation of multiple databases.  During this encounter critical care time was devoted to patient care services described in this note for 32 minutes.   Jacky Kindle, MD [place] [facility] See Amion for pager If no response to pager, please call [phone] until 7pm After 7pm, Please call E-link [phone]

## 2022-05-31 NOTE — Progress Notes (Signed)
[place] Progress Note Patient Name: SHANTA HARTNER DOB: November 25, 1950 MRN: [id number]   Date of Service  05/31/2022  HPI/Events of Note  Troponin increased to 70 from 40 No PE on CTA  eICU Interventions  Will increase enoxaparin to 40 BID for now and continue to trend troponin. If next troponin trends down reasonable to decrease anticoagulation back to prophylactic dose as elevated troponin likely secondary to demand ischemia from pulmonary process     Intervention Category Intermediate Interventions: Diagnostic test evaluation  Judd Lien 05/31/2022, 9:19 PM

## 2022-05-31 NOTE — Progress Notes (Signed)
Patient was placed on splint his breathing trial with a cough and she became tachypneic, hypoxic and tachycardic, was placed on pulm support mechanical ventilation but high heart rate keep going up ranging in the 180s, she looks anxious and tachypneic despite being on full support ventilation, she was given Versed x 2 without much improvement, was started on Precedex infusion.  X-ray chest was done which ruled out pneumothorax or infiltrates CT angiogram of chest was done which ruled out PE EKG showed MAT, serum troponins are pending Continue Precedex and as needed Versed    Additional critical care time 25 minutes   Jacky Kindle, MD [place] [facility] See Amion for pager If no response to pager, please call [phone] until 7pm After 7pm, Please call E-link [phone]

## 2022-05-31 NOTE — Progress Notes (Signed)
Patient transported to CT & back to room 2H06 on the ventilator with no problems.

## 2022-05-31 NOTE — Progress Notes (Signed)
In regards to pt's currently ordered MRI, due to a metallic foreign body seen in lower rt lung on prior imaging, per Dr Jeralyn Ruths (Radiologist), pt not a candidate for MRI until pt becomes A/O. If pt is not expected to become A/O, order will need to be cancelled.

## 2022-05-31 NOTE — Progress Notes (Signed)
SLP Cancellation Note  Patient Details Name: Abigail Castillo MRN: [id number] DOB: 1950/11/17   Cancelled treatment:       Reason Eval/Treat Not Completed: Patient not medically ready. Tolerate ATC for a few hours this am, but back on vent now, couldn't maintain sats. Will f/u for PMSV readiness.    Meli Faley, Katherene Ponto 05/31/2022, 10:57 AM

## 2022-06-01 DIAGNOSIS — J9602 Acute respiratory failure with hypercapnia: Secondary | ICD-10-CM | POA: Diagnosis not present

## 2022-06-01 DIAGNOSIS — E440 Moderate protein-calorie malnutrition: Secondary | ICD-10-CM | POA: Diagnosis not present

## 2022-06-01 DIAGNOSIS — J441 Chronic obstructive pulmonary disease with (acute) exacerbation: Secondary | ICD-10-CM | POA: Diagnosis not present

## 2022-06-01 DIAGNOSIS — J9621 Acute and chronic respiratory failure with hypoxia: Secondary | ICD-10-CM | POA: Diagnosis not present

## 2022-06-01 LAB — CBC
HCT: 32.5 % — ABNORMAL LOW (ref 36.0–46.0)
Hemoglobin: 9.6 g/dL — ABNORMAL LOW (ref 12.0–15.0)
MCH: 30.8 pg (ref 26.0–34.0)
MCHC: 29.5 g/dL — ABNORMAL LOW (ref 30.0–36.0)
MCV: 104.2 fL — ABNORMAL HIGH (ref 80.0–100.0)
Platelets: 108 10*3/uL — ABNORMAL LOW (ref 150–400)
RBC: 3.12 MIL/uL — ABNORMAL LOW (ref 3.87–5.11)
RDW: 14.9 % (ref 11.5–15.5)
WBC: 7.7 10*3/uL (ref 4.0–10.5)
nRBC: 0 % (ref 0.0–0.2)

## 2022-06-01 LAB — GLUCOSE, CAPILLARY
Glucose-Capillary: 134 mg/dL — ABNORMAL HIGH (ref 70–99)
Glucose-Capillary: 139 mg/dL — ABNORMAL HIGH (ref 70–99)
Glucose-Capillary: 154 mg/dL — ABNORMAL HIGH (ref 70–99)
Glucose-Capillary: 179 mg/dL — ABNORMAL HIGH (ref 70–99)
Glucose-Capillary: 198 mg/dL — ABNORMAL HIGH (ref 70–99)
Glucose-Capillary: 201 mg/dL — ABNORMAL HIGH (ref 70–99)

## 2022-06-01 LAB — BASIC METABOLIC PANEL
Anion gap: 9 (ref 5–15)
BUN: 43 mg/dL — ABNORMAL HIGH (ref 8–23)
CO2: 34 mmol/L — ABNORMAL HIGH (ref 22–32)
Calcium: 8.8 mg/dL — ABNORMAL LOW (ref 8.9–10.3)
Chloride: 104 mmol/L (ref 98–111)
Creatinine, Ser: 0.51 mg/dL (ref 0.44–1.00)
GFR, Estimated: >60 mL/min (ref >60)
Glucose, Bld: 216 mg/dL — ABNORMAL HIGH (ref 70–99)
Potassium: 4.2 mmol/L (ref 3.5–5.1)
Sodium: 147 mmol/L — ABNORMAL HIGH (ref 135–145)

## 2022-06-01 LAB — TROPONIN I (HIGH SENSITIVITY): Troponin I (High Sensitivity): 25 ng/L — ABNORMAL HIGH (ref <18)

## 2022-06-01 LAB — MAGNESIUM: Magnesium: 2.4 mg/dL (ref 1.7–2.4)

## 2022-06-01 LAB — PHOSPHORUS: Phosphorus: 3.1 mg/dL (ref 2.5–4.6)

## 2022-06-01 MED ORDER — CLONAZEPAM 0.25 MG PO TBDP
1.0000 mg | ORAL_TABLET | Freq: Three times a day (TID) | ORAL | Status: DC
  Administered 2022-06-01 (×2): 1 mg via ORAL
  Filled 2022-06-01 (×2): qty 4

## 2022-06-01 MED ORDER — INSULIN GLARGINE-YFGN 100 UNIT/ML ~~LOC~~ SOLN
10.0000 [IU] | Freq: Every day | SUBCUTANEOUS | Status: DC
  Administered 2022-06-02 – 2022-06-11 (×10): 10 [IU] via SUBCUTANEOUS
  Filled 2022-06-01 (×11): qty 0.1

## 2022-06-01 MED ORDER — ENOXAPARIN SODIUM 40 MG/0.4ML IJ SOSY
40.0000 mg | PREFILLED_SYRINGE | INTRAMUSCULAR | Status: DC
  Administered 2022-06-01 – 2022-07-03 (×33): 40 mg via SUBCUTANEOUS
  Filled 2022-06-01 (×34): qty 0.4

## 2022-06-01 MED ORDER — LACTATED RINGERS IV SOLN: INTRAVENOUS | Status: DC

## 2022-06-01 MED ORDER — OLANZAPINE 10 MG PO TABS
10.0000 mg | ORAL_TABLET | Freq: Every morning | ORAL | Status: DC
  Administered 2022-06-02 – 2022-07-04 (×31): 10 mg
  Filled 2022-06-01: qty 2
  Filled 2022-06-01 (×33): qty 1

## 2022-06-01 MED ORDER — FENTANYL BOLUS VIA INFUSION
25.0000 ug | INTRAVENOUS | Status: DC | PRN
  Administered 2022-06-01: 50 ug via INTRAVENOUS

## 2022-06-01 MED ORDER — SODIUM CHLORIDE 0.9 % IV BOLUS
500.0000 mL | Freq: Once | INTRAVENOUS | Status: AC
  Administered 2022-06-01: 500 mL via INTRAVENOUS

## 2022-06-01 MED ORDER — FENTANYL BOLUS VIA INFUSION: 25.0000 ug | INTRAVENOUS | Status: DC | PRN

## 2022-06-01 NOTE — Progress Notes (Signed)
[place] Progress Note Patient Name: Abigail Castillo DOB: 04-23-1951 MRN: [id number]   Date of Service  06/01/2022  HPI/Events of Note  Foley removed at 10am in the morning. Has not urinated for over 12 hours. Bladder scan only showed 257cc in bladder. BP 88/57 (66)   eICU Interventions  Ordered a trial of 500 cc NS bolus     Intervention Category Intermediate Interventions: Oliguria - evaluation and management;Hypotension - evaluation and management  Judd Lien 06/01/2022, 12:24 AM

## 2022-06-01 NOTE — Progress Notes (Signed)
NAME:  Abigail Castillo, MRN:  [id number], DOB:  Sep 04, 1950, LOS: 48 ADMISSION DATE:  05/19/2022, CONSULTATION DATE:  05/20/22 REFERRING MD:  Roger Shelter CHIEF COMPLAINT:  Dyspnea   History of Present Illness:  Abigail Castillo is a 72 y.o. female who has a PMH as below including but not limited to COPD on chronic 2-3L O2 and '10mg'$  Prednisone, OSA on nocturnal BiPAP, and who is followed by Dr. Lamonte Sakai in our office. She had recent admission 11/15 through 12/18 for AECOPD and was then discharged to [facility].  She was recently seen as an outpatient by Dr. Lamonte Sakai on 12/22 and was prescribed a slow prednisone taper with instructions to drop to '10mg'$  and continue until her next follow up appointment.  She presented back to [facility] ED 12/25 with dyspnea and hypoxia down to 65%. She was found to have hypoxic and hypercapnic respiratory failure and her RSV swab returned positive. She was placed on BiPAP and was admitted by [facility].  On 12/26, PCCM was consulted for assistance with BiPAP and COPD management.  Pertinent  Medical History:  has History of tobacco abuse; Depression; COPD (chronic obstructive pulmonary disease) ([place]); Osteoporosis; HOARSENESS, CHRONIC; HYPERTRIGLYCERIDEMIA; Memory change; B12 deficiency; Personal history of colonic polyps; Encounter for routine gynecological examination; Mild hyperlipidemia; Vitamin D deficiency; Esophageal stricture; GERD (gastroesophageal reflux disease); Schizoaffective disorder ([place]); Allergic rhinitis; Chronic respiratory failure ([place]); Acute on chronic respiratory failure with hypoxia ([place]); Elevated blood pressure reading without diagnosis of hypertension; Hepatic steatosis; Ductal carcinoma in situ (DCIS) of left breast; On home oxygen therapy; Hyponatremia; Bronchomalacia; Localized edema; COPD exacerbation ([place]); Tracheobronchomalacia; Acute on chronic respiratory failure with hypoxia and hypercapnia (HCC); COPD with acute exacerbation ([place]); RSV (respiratory syncytial  virus pneumonia); Essential hypertension; Malnutrition of moderate degree; Acute respiratory failure with hypercapnia ([place]); and Pressure injury of skin on their problem list.  Significant Hospital Events: Including procedures, antibiotic start and stop dates in addition to other pertinent events   12/25 admit. 12/26 PCCM consult. 1/2 Failed BiPAP wean, Intubated 1/3 CT Head negative was completed for observed pupillary change 1/4 tracheostomy  Interim History / Subjective:  Had a bowel movement yesterday After yesterday hypoxic and tachycardic remained, patient is placed back on spontaneous breathing trial, tolerating well so far though using accessory muscles Remained afebrile  Objective:  Blood pressure 95/64, pulse 79, temperature 97.8 F (36.6 C), temperature source Oral, resp. rate 19, height '5\' 2"'$  (1.575 m), weight 63.2 kg, SpO2 100 %.    Vent Mode: PSV;CPAP FiO2 (%):  [40 %] 40 % Set Rate:  [18 bmp] 18 bmp Vt Set:  [400 mL] 400 mL PEEP:  [5 cmH20] 5 cmH20 Pressure Support:  [10 cmH20] 10 cmH20 Plateau Pressure:  [14 cmH20-23 cmH20] 14 cmH20   Intake/Output Summary (Last 24 hours) at 06/01/2022 8099 Last data filed at 06/01/2022 0800 Gross per 24 hour  Intake 1784.38 ml  Output --  Net 1784.38 ml   Filed Weights   05/30/22 0333 05/31/22 0344 06/01/22 0600  Weight: 58.9 kg 61.4 kg 63.2 kg   Physical Examination: Physical exam: General: Acute on chronically ill-appearing female, lying on the bed HEENT: [place]/AT, eyes anicteric.  moist mucus membranes, s/p trach Neuro: Sleepy, opens eyes with vocal stimuli, looks anxious Chest: Unable to hear breath sounds, no wheezes or rhonchi Heart: Regular rate and rhythm, no murmurs or gallops Abdomen: Soft, nontender, nondistended, bowel sounds present Skin: No rash  Assessment & Plan:  Acute on chronic hypoxic and hypercarbic respiratory failure  in setting of AECOPD - 2/2 RSV with superimposed bacterial infection s/p  trach OSA Continue steroid taper  Continue scheduled bronchodilators Tolerating spontaneous breathing trial for now, will go slow in terms of trialing trach collar as she gets very anxious becomes tachycardic and hypoxic VAP prevention bundle Still requiring Precedex Fentanyl was titrated off PE was ruled out Serum troponin remains flat  Severe anxiety  Increase Effexor to 10 mg twice daily Started on Klonopin 1 mg 3 times daily Titrate off Precedex  Labile hypertension Norvasc on hold for now  Prediabetes with hyperglycemia in the setting of steroid Blood sugars are still not at the goal Increase Semglee to 10 units, continue sliding scale insulin with goal CBG 140-180  Moderate malnutrition Continue dietary supplements  CT Head 1/3 with concern for subacute infarct of medical left cerebellar hemisphere Old left frontal infarct  Yesterday CT chest showed some metabolic component in chest, unable to get MRI   Best Practice (right click and "Reselect all SmartList Selections" daily)   Diet/type: TF DVT prophylaxis: LMWH GI prophylaxis: PPI Foley: Discontinue Code Status:  full code Last date of multidisciplinary goals of care discussion [1/6: Patient's family was updated at bedside  This patient is critically ill with multiple organ system failure which requires frequent high complexity decision making, assessment, support, evaluation, and titration of therapies. This was completed through the application of advanced monitoring technologies and extensive interpretation of multiple databases.  During this encounter critical care time was devoted to patient care services described in this note for 36 minutes.   Jacky Kindle, MD [facility] See Amion for pager If no response to pager, please call [phone] until 7pm After 7pm, Please call E-link [phone] EEG

## 2022-06-01 NOTE — Progress Notes (Signed)
Pt placed on PS/CPAP 10/5 on 40% and is tolerating well. RT will monitor.

## 2022-06-01 NOTE — Progress Notes (Signed)
[place] Progress Note Patient Name: Abigail Castillo DOB: [birth year] MRN: [id number]   Date of Service  06/01/2022  HPI/Events of Note  Patient on precedex and 21 mic/hour of fentanyl drip. RN requested PRN fentanyl pushes from infusion bag  eICU Interventions  Order placed     Intervention Category Major Interventions: Respiratory failure - evaluation and management  Margaretmary Lombard 06/01/2022, 8:18 PM

## 2022-06-02 ENCOUNTER — Inpatient Hospital Stay (HOSPITAL_COMMUNITY): Payer: 59

## 2022-06-02 DIAGNOSIS — J9622 Acute and chronic respiratory failure with hypercapnia: Secondary | ICD-10-CM | POA: Diagnosis not present

## 2022-06-02 DIAGNOSIS — J9621 Acute and chronic respiratory failure with hypoxia: Secondary | ICD-10-CM | POA: Diagnosis not present

## 2022-06-02 LAB — GLUCOSE, CAPILLARY
Glucose-Capillary: >600 mg/dL (ref 70–99)
Glucose-Capillary: 148 mg/dL — ABNORMAL HIGH (ref 70–99)
Glucose-Capillary: 177 mg/dL — ABNORMAL HIGH (ref 70–99)
Glucose-Capillary: 161 mg/dL — ABNORMAL HIGH (ref 70–99)
Glucose-Capillary: 113 mg/dL — ABNORMAL HIGH (ref 70–99)
Glucose-Capillary: 159 mg/dL — ABNORMAL HIGH (ref 70–99)

## 2022-06-02 LAB — BASIC METABOLIC PANEL
Anion gap: 7 (ref 5–15)
BUN: 37 mg/dL — ABNORMAL HIGH (ref 8–23)
CO2: 36 mmol/L — ABNORMAL HIGH (ref 22–32)
Calcium: 8.7 mg/dL — ABNORMAL LOW (ref 8.9–10.3)
Chloride: 104 mmol/L (ref 98–111)
Creatinine, Ser: 0.4 mg/dL — ABNORMAL LOW (ref 0.44–1.00)
GFR, Estimated: >60 mL/min (ref >60)
Glucose, Bld: 146 mg/dL — ABNORMAL HIGH (ref 70–99)
Potassium: 4.3 mmol/L (ref 3.5–5.1)
Sodium: 147 mmol/L — ABNORMAL HIGH (ref 135–145)

## 2022-06-02 LAB — MAGNESIUM: Magnesium: 2.2 mg/dL (ref 1.7–2.4)

## 2022-06-02 LAB — CBC
HCT: 30.8 % — ABNORMAL LOW (ref 36.0–46.0)
Hemoglobin: 9.4 g/dL — ABNORMAL LOW (ref 12.0–15.0)
MCH: 32 pg (ref 26.0–34.0)
MCHC: 30.5 g/dL (ref 30.0–36.0)
MCV: 104.8 fL — ABNORMAL HIGH (ref 80.0–100.0)
Platelets: 127 10*3/uL — ABNORMAL LOW (ref 150–400)
RBC: 2.94 MIL/uL — ABNORMAL LOW (ref 3.87–5.11)
RDW: 14.9 % (ref 11.5–15.5)
WBC: 8.3 10*3/uL (ref 4.0–10.5)
nRBC: 0 % (ref 0.0–0.2)

## 2022-06-02 LAB — PHOSPHORUS: Phosphorus: 3.2 mg/dL (ref 2.5–4.6)

## 2022-06-02 MED ORDER — CLONAZEPAM 0.25 MG PO TBDP
2.0000 mg | ORAL_TABLET | Freq: Three times a day (TID) | ORAL | Status: DC
  Administered 2022-06-02 (×2): 2 mg
  Filled 2022-06-02 (×3): qty 8

## 2022-06-02 MED ORDER — CLONAZEPAM 0.25 MG PO TBDP: 2.0000 mg | ORAL_TABLET | Freq: Three times a day (TID) | ORAL | Status: DC

## 2022-06-02 MED ORDER — BUDESONIDE 0.5 MG/2ML IN SUSP
0.5000 mg | Freq: Two times a day (BID) | RESPIRATORY_TRACT | Status: DC
  Administered 2022-06-02 – 2022-07-04 (×65): 0.5 mg via RESPIRATORY_TRACT
  Filled 2022-06-02 (×65): qty 2

## 2022-06-02 MED ORDER — CLONIDINE HCL 0.1 MG PO TABS
0.1000 mg | ORAL_TABLET | Freq: Three times a day (TID) | ORAL | Status: DC
  Administered 2022-06-02 – 2022-06-06 (×11): 0.1 mg
  Filled 2022-06-02 (×12): qty 1

## 2022-06-02 MED ORDER — PREDNISONE 2.5 MG PO TABS
2.5000 mg | ORAL_TABLET | Freq: Every day | ORAL | Status: DC
  Administered 2022-06-02 – 2022-06-09 (×8): 2.5 mg
  Filled 2022-06-02 (×8): qty 1

## 2022-06-02 MED ORDER — CLONIDINE HCL 0.1 MG PO TABS
0.2000 mg | ORAL_TABLET | Freq: Four times a day (QID) | ORAL | Status: DC
  Administered 2022-06-02: 0.2 mg
  Filled 2022-06-02: qty 2

## 2022-06-02 MED ORDER — ORAL CARE MOUTH RINSE
15.0000 mL | OROMUCOSAL | Status: DC
  Administered 2022-06-02 – 2022-06-18 (×183): 15 mL via OROMUCOSAL

## 2022-06-02 MED ORDER — BUSPIRONE HCL 5 MG PO TABS
5.0000 mg | ORAL_TABLET | Freq: Three times a day (TID) | ORAL | Status: DC
  Administered 2022-06-02 – 2022-07-04 (×90): 5 mg
  Filled 2022-06-02 (×90): qty 1

## 2022-06-02 MED ORDER — ATORVASTATIN CALCIUM 40 MG PO TABS
40.0000 mg | ORAL_TABLET | Freq: Every day | ORAL | Status: DC
  Administered 2022-06-02 – 2022-07-04 (×31): 40 mg
  Filled 2022-06-02 (×31): qty 1

## 2022-06-02 MED ORDER — ORAL CARE MOUTH RINSE: 15.0000 mL | OROMUCOSAL | Status: DC | PRN

## 2022-06-02 MED ORDER — CLONIDINE HCL 0.1 MG PO TABS: 0.1000 mg | ORAL_TABLET | Freq: Two times a day (BID) | ORAL | Status: DC

## 2022-06-02 MED ORDER — ASPIRIN 81 MG PO CHEW
81.0000 mg | CHEWABLE_TABLET | Freq: Every day | ORAL | Status: DC
  Administered 2022-06-02 – 2022-06-10 (×9): 81 mg
  Filled 2022-06-02 (×9): qty 1

## 2022-06-02 MED ORDER — FREE WATER
200.0000 mL | Status: DC
  Administered 2022-06-02 – 2022-06-04 (×12): 200 mL

## 2022-06-02 MED ORDER — CLONIDINE HCL 0.1 MG PO TABS: 0.1000 mg | ORAL_TABLET | Freq: Three times a day (TID) | ORAL | Status: DC

## 2022-06-02 MED ORDER — LORATADINE 10 MG PO TABS
10.0000 mg | ORAL_TABLET | Freq: Every day | ORAL | Status: DC
  Administered 2022-06-02 – 2022-07-04 (×31): 10 mg
  Filled 2022-06-02 (×31): qty 1

## 2022-06-02 MED ORDER — CLONIDINE HCL 0.1 MG PO TABS: 0.1000 mg | ORAL_TABLET | Freq: Four times a day (QID) | ORAL | Status: DC

## 2022-06-02 NOTE — Progress Notes (Signed)
RT removed pt from ventilator and placed pt on 12L 40% ATC with vent on standby at bedside. Pt tolerated well with SVS. RN notified. RT will continue to monitor

## 2022-06-02 NOTE — Progress Notes (Signed)
NAME:  Abigail Castillo, MRN:  [id number], DOB:  January 27, 1951, LOS: 26 ADMISSION DATE:  05/19/2022, CONSULTATION DATE:  05/20/22 REFERRING MD:  Roger Shelter CHIEF COMPLAINT:  Dyspnea   History of Present Illness:  Abigail Castillo is a 72 y.o. female who has a PMH as below including but not limited to COPD on chronic 2-3L O2 and '10mg'$  Prednisone, OSA on nocturnal BiPAP, and who is followed by Dr. Lamonte Sakai in our office. She had recent admission 11/15 through 12/18 for AECOPD and was then discharged to [facility].  She was recently seen as an outpatient by Dr. Lamonte Sakai on 12/22 and was prescribed a slow prednisone taper with instructions to drop to '10mg'$  and continue until her next follow up appointment.  She presented back to [facility] ED 12/25 with dyspnea and hypoxia down to 65%. She was found to have hypoxic and hypercapnic respiratory failure and her RSV swab returned positive. She was placed on BiPAP and was admitted by [facility].  On 12/26, PCCM was consulted for assistance with BiPAP and COPD management.  Pertinent  Medical History:  has History of tobacco abuse; Depression; COPD (chronic obstructive pulmonary disease) ([place]); Osteoporosis; HOARSENESS, CHRONIC; HYPERTRIGLYCERIDEMIA; Memory change; B12 deficiency; Personal history of colonic polyps; Encounter for routine gynecological examination; Mild hyperlipidemia; Vitamin D deficiency; Esophageal stricture; GERD (gastroesophageal reflux disease); Schizoaffective disorder ([place]); Allergic rhinitis; Chronic respiratory failure ([place]); Acute on chronic respiratory failure with hypoxia ([place]); Elevated blood pressure reading without diagnosis of hypertension; Hepatic steatosis; Ductal carcinoma in situ (DCIS) of left breast; On home oxygen therapy; Hyponatremia; Bronchomalacia; Localized edema; COPD exacerbation ([place]); Tracheobronchomalacia; Acute on chronic respiratory failure with hypoxia and hypercapnia (HCC); COPD with acute exacerbation ([place]); RSV (respiratory syncytial  virus pneumonia); Essential hypertension; Malnutrition of moderate degree; Acute respiratory failure with hypercapnia ([place]); and Pressure injury of skin on their problem list.  Significant Hospital Events: Including procedures, antibiotic start and stop dates in addition to other pertinent events   12/25 admit. 12/26 PCCM consult. 1/2 Failed BiPAP wean, Intubated 1/3 CT Head negative was completed for observed pupillary change 1/4 tracheostomy  Interim History / Subjective:  Lots of anxiety limiting wean efforts  Objective:  Blood pressure 111/69, pulse (!) 103, temperature 98.3 F (36.8 C), temperature source Oral, resp. rate (!) 22, height '5\' 2"'$  (1.575 m), weight 62.6 kg, SpO2 100 %.    Vent Mode: PSV;CPAP FiO2 (%):  [35 %-40 %] 35 % Set Rate:  [18 bmp] 18 bmp Vt Set:  [400 mL] 400 mL PEEP:  [5 cmH20] 5 cmH20 Pressure Support:  [8 cmH20] 8 cmH20 Plateau Pressure:  [18 cmH20-21 cmH20] 18 cmH20   Intake/Output Summary (Last 24 hours) at 06/02/2022 0816 Last data filed at 06/02/2022 0700 Gross per 24 hour  Intake 3094.76 ml  Output 900 ml  Net 2194.76 ml    Filed Weights   05/31/22 0344 06/01/22 0600 06/02/22 0443  Weight: 61.4 kg 63.2 kg 62.6 kg   Physical Examination: Anxious woman in NAD Trach in place, minimal secretions Breath/heart sounds distant due to emphysema and body habitus Triggers vent Moves ext x 4 Tries to write but I can't read her handwriting Ext warm  Sugars ok Sodium up Stable mild thrombocytopenia and anemia  Assessment & Plan:  Severe COPD- with RSV-induced flare now causing inability to wean from vent, s/p tracheostomy 05/29/22 Chronic anxiety/depression GERD Breast ca- on arimidex L cerebellar and L frontal stroke- age indeterminate; CTA head/neck with nothing actionable.  Tele w/o afib. DM with  hyperglycemia- should improve with steroid taper  - Start clonidine, wean precedex - Start buspar, increase klonipin - Continue PTA zyprexa -  Consider SSRI later this week - DC fent gtt - Drop steroids to 2.5 mg daily (not helping anxiety) - Start pulmicort, continue brovana/yupelri - Work toward [employer] - Increase FWF - Start ASA, statin for stroke prevention - PT/OT   Best Practice (right click and "Reselect all SmartList Selections" daily)   Diet/type: TF DVT prophylaxis: LMWH GI prophylaxis: PPI Foley: Discontinue Code Status:  full code Last date of multidisciplinary goals of care discussion [1/6: Patient's family was updated at bedside  34 min cc time Erskine Emery MD PCCM

## 2022-06-02 NOTE — Progress Notes (Signed)
Physical Therapy Treatment Patient Details Name: Abigail Castillo MRN: [id number] DOB: [birth year] Today's Date: 06/02/2022   History of Present Illness Mrs. Bieker is a 72 y.o. F recently admitted for COPD flare, discharged to SNF who returned due to SpO2 65%. Found to be  RSV+, pCO2 80, started on BiPAP.   Head CT showing possible subacute infarct of the medial left cerebellar hemisphere. Failed bipap wean on 1/2, intubated. Tracheostomy 1/4. Pt with COPD and chronic respiratory failure on 2-3L home O2, depression/anxiety and hx BrCA    PT Comments    Treatment session limited due to lethargy. RN present to adjust Precedex, but no increase in arousal noted. PT performed PROM to BLE's and positioned pt edge of bed to attempt to elicit alertness. Desat to 88% on ATC and returned to supine. Will continue efforts.    Recommendations for follow up therapy are one component of a multi-disciplinary discharge planning process, led by the attending physician.  Recommendations may be updated based on patient status, additional functional criteria and insurance authorization.  Follow Up Recommendations  Skilled nursing-short term rehab (<3 hours/day) (v LTACH) Can patient physically be transported by private vehicle: No   Assistance Recommended at Discharge Frequent or constant Supervision/Assistance  Patient can return home with the following A lot of help with walking and/or transfers;A lot of help with bathing/dressing/bathroom   Equipment Recommendations  None recommended by PT    Recommendations for Other Services       Precautions / Restrictions Precautions Precautions: Fall Precaution Comments: vent via trach Restrictions Weight Bearing Restrictions: No     Mobility  Bed Mobility Overal bed mobility: Needs Assistance Bed Mobility: Supine to Sit, Sit to Supine     Supine to sit: Total assist, +2 for physical assistance Sit to supine: Total assist, +2 for physical assistance    General bed mobility comments: Decreased initiation due to lethargy    Transfers                        Ambulation/Gait                   Stairs             Wheelchair Mobility    Modified Rankin (Stroke Patients Only)       Balance Overall balance assessment: Needs assistance Sitting-balance support: Feet supported, Bilateral upper extremity supported Sitting balance-Leahy Scale: Poor Sitting balance - Comments: Right lateral lean, requiring modA                                    Cognition Arousal/Alertness: Awake/alert Behavior During Therapy: Flat affect Overall Cognitive Status: Difficult to assess                                 General Comments: Lethargic, following < 10% of commands        Exercises General Exercises - Lower Extremity Ankle Circles/Pumps: PROM, Both, 10 reps, Supine Heel Slides: PROM, Both, 10 reps, Supine    General Comments        Pertinent Vitals/Pain Pain Assessment Pain Assessment: Faces Faces Pain Scale: No hurt    Home Living                          Prior Function  PT Goals (current goals can now be found in the care plan section) Acute Rehab PT Goals Patient Stated Goal: unable Potential to Achieve Goals: Fair    Frequency    Min 2X/week      PT Plan Current plan remains appropriate    Co-evaluation              AM-PAC PT "6 Clicks" Mobility   Outcome Measure  Help needed turning from your back to your side while in a flat bed without using bedrails?: A Lot Help needed moving from lying on your back to sitting on the side of a flat bed without using bedrails?: Total Help needed moving to and from a bed to a chair (including a wheelchair)?: Total Help needed standing up from a chair using your arms (e.g., wheelchair or bedside chair)?: Total Help needed to walk in hospital room?: Total Help needed climbing 3-5 steps with a  railing? : Total 6 Click Score: 7    End of Session Equipment Utilized During Treatment: Oxygen Activity Tolerance: Patient limited by lethargy Patient left: in bed;with call bell/phone within reach;with family/visitor present Nurse Communication: Mobility status PT Visit Diagnosis: Difficulty in walking, not elsewhere classified (R26.2);Unsteadiness on feet (R26.81);Other abnormalities of gait and mobility (R26.89)     Time: 7290-2111 PT Time Calculation (min) (ACUTE ONLY): 16 min  Charges:  $Therapeutic Activity: 8-22 mins                     Wyona Almas, PT, DPT Acute Rehabilitation Services Office [phone]    Deno Etienne 06/02/2022, 2:45 PM

## 2022-06-02 NOTE — Progress Notes (Signed)
Pt transported to CT and back on ventilator with no complications.

## 2022-06-02 NOTE — TOC CM/SW Note (Addendum)
Received notification that insurance is requesting P2P for LTAC, please call # [phone] option 5 by 4 pm, information sent to attending. Notified LTAC rep, Anderson Malta. [place], Heart Failure TOC CM [phone]

## 2022-06-02 NOTE — Progress Notes (Addendum)
[place] Progress Note Patient Name: MILLEY VINING DOB: 1951-04-06 MRN: [id number]   Date of Service  06/02/2022  HPI/Events of Note  Notified of anisocoria More lethargic attributed to medication changes  CT head done las 1/3 for anisocoria: 1. Possible subacute infarct of the medial left cerebellar hemisphere. MRI is recommended. 2. Old left frontal infarct. Chronic ischemic microangiopathic white matter changes.  Unable to proceed with with MRI due to presence of hardware  eICU Interventions  Ordered head CT stat given recurrence of anisocoria with mental status change as well as previous CT of possible findings Discussed with bedside RN  Follow-up: CT without acute findings     Intervention Category Intermediate Interventions: Other:  Judd Lien 06/02/2022, 8:55 PM

## 2022-06-03 DIAGNOSIS — J9622 Acute and chronic respiratory failure with hypercapnia: Secondary | ICD-10-CM | POA: Diagnosis not present

## 2022-06-03 DIAGNOSIS — J9621 Acute and chronic respiratory failure with hypoxia: Secondary | ICD-10-CM | POA: Diagnosis not present

## 2022-06-03 LAB — BASIC METABOLIC PANEL
Anion gap: 8 (ref 5–15)
BUN: 29 mg/dL — ABNORMAL HIGH (ref 8–23)
CO2: 36 mmol/L — ABNORMAL HIGH (ref 22–32)
Calcium: 8.5 mg/dL — ABNORMAL LOW (ref 8.9–10.3)
Chloride: 99 mmol/L (ref 98–111)
Creatinine, Ser: 0.47 mg/dL (ref 0.44–1.00)
GFR, Estimated: >60 mL/min (ref >60)
Glucose, Bld: 131 mg/dL — ABNORMAL HIGH (ref 70–99)
Potassium: 4.4 mmol/L (ref 3.5–5.1)
Sodium: 143 mmol/L (ref 135–145)

## 2022-06-03 LAB — URINALYSIS, ROUTINE W REFLEX MICROSCOPIC
Bilirubin Urine: NEGATIVE
Glucose, UA: NEGATIVE mg/dL
Hgb urine dipstick: NEGATIVE
Ketones, ur: NEGATIVE mg/dL
Leukocytes,Ua: NEGATIVE
Nitrite: NEGATIVE
Protein, ur: NEGATIVE mg/dL
Specific Gravity, Urine: 1.013 (ref 1.005–1.030)
pH: 7 (ref 5.0–8.0)

## 2022-06-03 LAB — CBC
HCT: 32.1 % — ABNORMAL LOW (ref 36.0–46.0)
Hemoglobin: 9.9 g/dL — ABNORMAL LOW (ref 12.0–15.0)
MCH: 31.7 pg (ref 26.0–34.0)
MCHC: 30.8 g/dL (ref 30.0–36.0)
MCV: 102.9 fL — ABNORMAL HIGH (ref 80.0–100.0)
Platelets: 157 10*3/uL (ref 150–400)
RBC: 3.12 MIL/uL — ABNORMAL LOW (ref 3.87–5.11)
RDW: 15 % (ref 11.5–15.5)
WBC: 9.9 10*3/uL (ref 4.0–10.5)
nRBC: 0 % (ref 0.0–0.2)

## 2022-06-03 LAB — GLUCOSE, CAPILLARY
Glucose-Capillary: 129 mg/dL — ABNORMAL HIGH (ref 70–99)
Glucose-Capillary: 143 mg/dL — ABNORMAL HIGH (ref 70–99)
Glucose-Capillary: 143 mg/dL — ABNORMAL HIGH (ref 70–99)
Glucose-Capillary: 150 mg/dL — ABNORMAL HIGH (ref 70–99)
Glucose-Capillary: 156 mg/dL — ABNORMAL HIGH (ref 70–99)
Glucose-Capillary: 160 mg/dL — ABNORMAL HIGH (ref 70–99)
Glucose-Capillary: 164 mg/dL — ABNORMAL HIGH (ref 70–99)

## 2022-06-03 LAB — MAGNESIUM: Magnesium: 2.1 mg/dL (ref 1.7–2.4)

## 2022-06-03 LAB — PHOSPHORUS: Phosphorus: 3.5 mg/dL (ref 2.5–4.6)

## 2022-06-03 MED ORDER — IPRATROPIUM BROMIDE 0.02 % IN SOLN
0.5000 mg | Freq: Four times a day (QID) | RESPIRATORY_TRACT | Status: DC
  Administered 2022-06-03: 0.5 mg via RESPIRATORY_TRACT
  Filled 2022-06-03: qty 2.5

## 2022-06-03 MED ORDER — LEVALBUTEROL HCL 0.63 MG/3ML IN NEBU
0.6300 mg | INHALATION_SOLUTION | Freq: Four times a day (QID) | RESPIRATORY_TRACT | Status: DC
  Administered 2022-06-03: 0.63 mg via RESPIRATORY_TRACT
  Filled 2022-06-03: qty 3

## 2022-06-03 MED ORDER — LEVALBUTEROL HCL 0.63 MG/3ML IN NEBU
0.6300 mg | INHALATION_SOLUTION | Freq: Four times a day (QID) | RESPIRATORY_TRACT | Status: DC
  Administered 2022-06-03 – 2022-06-15 (×48): 0.63 mg via RESPIRATORY_TRACT
  Filled 2022-06-03 (×48): qty 3

## 2022-06-03 MED ORDER — CLONAZEPAM 0.25 MG PO TBDP
1.0000 mg | ORAL_TABLET | Freq: Three times a day (TID) | ORAL | Status: DC
  Administered 2022-06-03: 1 mg
  Filled 2022-06-03: qty 4

## 2022-06-03 MED ORDER — CLONAZEPAM 0.5 MG PO TBDP
1.0000 mg | ORAL_TABLET | Freq: Two times a day (BID) | ORAL | Status: DC | PRN
  Administered 2022-06-04 – 2022-07-03 (×15): 1 mg
  Filled 2022-06-03: qty 4
  Filled 2022-06-03: qty 2
  Filled 2022-06-03 (×5): qty 4
  Filled 2022-06-03: qty 2
  Filled 2022-06-03 (×5): qty 4
  Filled 2022-06-03 (×3): qty 2

## 2022-06-03 NOTE — Progress Notes (Signed)
SLP Cancellation Note  Patient Details Name: Abigail Castillo MRN: [id number] DOB: 07-09-1950   Cancelled treatment:       Reason Eval/Treat Not Completed: Patient not medically ready   Neelam Tiggs, Katherene Ponto 06/03/2022, 7:33 AM

## 2022-06-03 NOTE — Progress Notes (Signed)
Nutrition Follow-up  DOCUMENTATION CODES:   Non-severe (moderate) malnutrition in context of acute illness/injury  INTERVENTION:   Tube Feeding via Cortrak: Osmolite 1.5 at 50 ml/h (1200 ml per day) Pro-Source TF20 60 ml 1x/d Provides 1880 kcal, 95 gm protein, 914 ml free water daily  Free water flush of 200 mL q 4 hours plus TF provides total free water of: 2114 mL/day   Continue MVI daily   NUTRITION DIAGNOSIS:   Moderate Malnutrition related to acute illness (COPD exacerbation) as evidenced by mild fat depletion, mild muscle depletion, percent weight loss.  Being addressed via TF  GOAL:   Patient will meet greater than or equal to 90% of their needs  Met via TF  MONITOR:   PO intake, Supplement acceptance, Labs, Weight trends  REASON FOR ASSESSMENT:   Consult Assessment of nutrition requirement/status  ASSESSMENT:   Pt admitted from [facility] SNF with SOB r/t COPD exacerbation. PMH significant for breast cancer (2022), COPD on 2-3L home O2, depression/anxiety. Recently admitted 11/15-12/18 for COPD exacerbation.  12/25 Admitted 01/02 Intubated 01/03 Cortrak placed 01/04 Trach placed  Pt remains on vent support, TC trials as able LTACH declined pt, appeal underway  Osmolite 1.5 at 50 ml/hr via Cortrak Free water flush of 200 mL q 4 hours  Constipation has resolved  Weight up since admission; current wt 63.4 kg; admit weight 61.7 kg. Net + per I/O flow sheet. Some non-pitting edema present  Labs: reviewed Meds: colace, ss novolog, semglee, miralax, senokot, prednisone  Diet Order:   Diet Order             Diet NPO time specified  Diet effective now                   EDUCATION NEEDS:   Not appropriate for education at this time  Skin:  Skin Assessment: Reviewed RN Assessment  Last BM:  1/9 type 6 small  Height:   Ht Readings from Last 1 Encounters:  05/21/22 '5\' 2"'$  (1.575 m)    Weight:   Wt Readings from Last 1 Encounters:   06/03/22 63.4 kg    Ideal Body Weight:     BMI:  Body mass index is 25.56 kg/m.  Estimated Nutritional Needs:   Kcal:  1700-1900 kcal/d  Protein:  90-105  Fluid:  1.8-2 L/d    Kerman Passey MS, RDN, LDN, CNSC Registered Dietitian 3 Clinical Nutrition RD Pager and On-Call Pager Number Located in [place]

## 2022-06-03 NOTE — Progress Notes (Signed)
Occupational Therapy Treatment Patient Details Name: Abigail Castillo MRN: [id number] DOB: [birth year] Today's Date: 06/03/2022   History of present illness Abigail Castillo is a 72 y.o. F recently admitted for COPD flare, discharged to SNF who returned due to SpO2 65%. Found to be  RSV+, pCO2 80, started on BiPAP.   Head CT showing possible subacute infarct of the medial left cerebellar hemisphere. Failed bipap wean on 1/2, intubated. Tracheostomy 1/4. Pt with COPD and chronic respiratory failure on 2-3L home O2, depression/anxiety and hx BrCA   OT comments  Abigail Castillo is making incremental progress. She tolerated bilat UE AAROM and grooming while supported upright in bed with increase in arousal noted. Overall she required max A to transfer to EOB and was min G - mod A for sitting balance with L lateral lean. Multimodal cues required for all initiation and sequencing of tasks. Pt sat EOB for BLE and trunk exercises for ~10 minutes. OT to continue to follow acutely. D/c remains appropriate.    Recommendations for follow up therapy are one component of a multi-disciplinary discharge planning process, led by the attending physician.  Recommendations may be updated based on patient status, additional functional criteria and insurance authorization.    Follow Up Recommendations  OT at Long-term acute care hospital     Assistance Recommended at Discharge Frequent or constant Supervision/Assistance  Patient can return home with the following  Two people to help with walking and/or transfers;Two people to help with bathing/dressing/bathroom;Assistance with feeding;Direct supervision/assist for medications management;Assistance with cooking/housework;Direct supervision/assist for financial management;Assist for transportation;Help with stairs or ramp for entrance   Equipment Recommendations  Other (comment)       Precautions / Restrictions Precautions Precautions: Fall Precaution Comments: vent via  trach Restrictions Weight Bearing Restrictions: No       Mobility Bed Mobility Overal bed mobility: Needs Assistance Bed Mobility: Sit to Supine, Supine to Sit     Supine to sit: Max assist Sit to supine: Max assist   General bed mobility comments: requires simple direct cues to initiate and sequence task    Transfers                   General transfer comment: deferred     Balance Overall balance assessment: Needs assistance Sitting-balance support: Feet supported, Single extremity supported Sitting balance-Leahy Scale: Poor Sitting balance - Comments: L lateral lean         ADL either performed or assessed with clinical judgement   ADL Overall ADL's : Needs assistance/impaired     Grooming: Set up;Wash/dry face;Bed level Grooming Details (indicate cue type and reason): washed face with RUE while supported upright in bed         Functional mobility during ADLs: Maximal assistance General ADL Comments: max A to get to EOB. completed bed level and EOB exercises    Extremity/Trunk Assessment Upper Extremity Assessment Upper Extremity Assessment: Generalized weakness RUE Deficits / Details: tremor not noted this date - moving moderately against gravity. grip strength is 3/5. Used functionally to wash face LUE Deficits / Details: tremor not noted, moving moderately against gravity. More AROM available when compared to R. grip strength is 3/5   Lower Extremity Assessment Lower Extremity Assessment: Defer to PT evaluation        Vision   Vision Assessment?: Vision impaired- to be further tested in functional context          Cognition Arousal/Alertness: Awake/alert Behavior During Therapy: Flat affect Overall Cognitive Status: Difficult to  assess                 General Comments: awake and alert, flat affect. nodding yes/no appropriately, requires multimodal cues to initiate and sequence tasks        Exercises General Exercises - Upper  Extremity Shoulder Flexion: AAROM, Both, 10 reps Elbow Flexion: AAROM, Both, 10 reps Digit Composite Flexion: AAROM, Both, 5 reps Other Exercises Other Exercises: trunk rotation in sitting with 10 secon holds x4 each side Other Exercises: bilat AAROM knee extensions while sitting EOB       General Comments VSS 40% FiO2 PEEP 5    Pertinent Vitals/ Pain       Pain Assessment Pain Assessment: Faces Faces Pain Scale: Hurts a little bit Pain Location: pointed to abdomen Pain Descriptors / Indicators: Discomfort Pain Intervention(s): Limited activity within patient's tolerance, Monitored during session   Frequency  Min 2X/week        Progress Toward Goals  OT Goals(current goals can now be found in the care plan section)  Progress towards OT goals: Progressing toward goals  Acute Rehab OT Goals Patient Stated Goal: unable to state OT Goal Formulation: With patient Time For Goal Achievement: 06/11/22 Potential to Achieve Goals: Fair ADL Goals Pt Will Perform Grooming: with set-up;sitting Pt Will Perform Lower Body Bathing: with modified independence;sit to/from stand Pt Will Perform Upper Body Dressing: sitting;with min assist Pt Will Perform Lower Body Dressing: with modified independence;sit to/from stand Pt Will Transfer to Toilet: with modified independence;ambulating;bedside commode Pt Will Perform Toileting - Clothing Manipulation and hygiene: with modified independence;sit to/from stand Pt/caregiver will Perform Home Exercise Program: Increased strength;Both right and left upper extremity;With Supervision Additional ADL Goal #1: Pt will perform bed mobility with max assist in preparation for ADLs. Additional ADL Goal #2: Pt will demonstrate fair sitting balance x 10 minutes in preparation for ADLs.  Plan Discharge plan needs to be updated       AM-PAC OT "6 Clicks" Daily Activity     Outcome Measure   Help from another person eating meals?: Total Help from  another person taking care of personal grooming?: A Lot Help from another person toileting, which includes using toliet, bedpan, or urinal?: Total Help from another person bathing (including washing, rinsing, drying)?: Total Help from another person to put on and taking off regular upper body clothing?: Total Help from another person to put on and taking off regular lower body clothing?: Total 6 Click Score: 7    End of Session    OT Visit Diagnosis: Muscle weakness (generalized) (M62.81)   Activity Tolerance Patient tolerated treatment well   Patient Left in bed;with call bell/phone within reach;with nursing/sitter in room   Nurse Communication Mobility status        Time: 0321-2248 OT Time Calculation (min): 23 min  Charges: OT General Charges $OT Visit: 1 Visit OT Treatments $Therapeutic Activity: 23-37 mins    Elliot Cousin 06/03/2022, 5:17 PM

## 2022-06-03 NOTE — Progress Notes (Signed)
[facility] Progress Note Patient Name: Abigail Castillo DOB: 1951-05-11 MRN: [id number]   Date of Service  06/03/2022  HPI/Events of Note  Received request for I/O cath order for bladder scan > 400  eICU Interventions  In and out cath ordered     Intervention Category Intermediate Interventions: Other:  Judd Lien 06/03/2022, 12:53 AM

## 2022-06-03 NOTE — TOC CM/SW Note (Signed)
CM received appeal paperwork from [employer] for LTAC, attending signed and scanned to [place], Anderson [place]. Pt was initially denied for LTAC, P2P was completed. MD and family requested an appeal. Jonnie Finner RN3 CCM, Heart Failure TOC CM [phone]

## 2022-06-03 NOTE — Progress Notes (Signed)
RT removed pt from ventilator support and placed pt on 12L 40% ATC. Pt tolerating well at this time. RT left ventilator on stby at bedside. RN notified. RT will continue to monitor pt.

## 2022-06-03 NOTE — Progress Notes (Addendum)
NAME:  Abigail Castillo, MRN:  [id number], DOB:  Feb 09, 1951, LOS: 59 ADMISSION DATE:  05/19/2022, CONSULTATION DATE:  05/20/22 REFERRING MD:  Roger Shelter CHIEF COMPLAINT:  Dyspnea   History of Present Illness:  Abigail Castillo is a 72 y.o. female who has a PMH as below including but not limited to COPD on chronic 2-3L O2 and '10mg'$  Prednisone, OSA on nocturnal BiPAP, and who is followed by Dr. Lamonte Sakai in our office. She had recent admission 11/15 through 12/18 for AECOPD and was then discharged to [facility].  She was recently seen as an outpatient by Dr. Lamonte Sakai on 12/22 and was prescribed a slow prednisone taper with instructions to drop to '10mg'$  and continue until her next follow up appointment.  She presented back to [facility] ED 12/25 with dyspnea and hypoxia down to 65%. She was found to have hypoxic and hypercapnic respiratory failure and her RSV swab returned positive. She was placed on BiPAP and was admitted by [facility].  On 12/26, PCCM was consulted for assistance with BiPAP and COPD management.  Pertinent  Medical History:  has History of tobacco abuse; Depression; COPD (chronic obstructive pulmonary disease) ([place]); Osteoporosis; HOARSENESS, CHRONIC; HYPERTRIGLYCERIDEMIA; Memory change; B12 deficiency; Personal history of colonic polyps; Encounter for routine gynecological examination; Mild hyperlipidemia; Vitamin D deficiency; Esophageal stricture; GERD (gastroesophageal reflux disease); Schizoaffective disorder ([place]); Allergic rhinitis; Chronic respiratory failure ([place]); Acute on chronic respiratory failure with hypoxia ([place]); Elevated blood pressure reading without diagnosis of hypertension; Hepatic steatosis; Ductal carcinoma in situ (DCIS) of left breast; On home oxygen therapy; Hyponatremia; Bronchomalacia; Localized edema; COPD exacerbation ([place]); Tracheobronchomalacia; Acute on chronic respiratory failure with hypoxia and hypercapnia (HCC); COPD with acute exacerbation ([place]); RSV (respiratory syncytial  virus pneumonia); Essential hypertension; Malnutrition of moderate degree; Acute respiratory failure with hypercapnia ([place]); and Pressure injury of skin on their problem list.  Significant Hospital Events: Including procedures, antibiotic start and stop dates in addition to other pertinent events   12/25 admit. 12/26 PCCM consult. 1/2 Failed BiPAP wean, Intubated 1/3 CT Head negative was completed for observed pupillary change 1/4 tracheostomy 1/8 CT head for anisocoria; neg,   Interim History / Subjective:  Did C collar for a while yesterday. Anxiety better controlled. Bps did not like clonidine. RN thinks klonipin may be knocking her out too much as well.  Objective:  Blood pressure 123/80, pulse (!) 107, temperature 99.8 F (37.7 C), temperature source Oral, resp. rate (!) 25, height '5\' 2"'$  (1.575 m), weight 63.4 kg, SpO2 97 %.    Vent Mode: PRVC FiO2 (%):  [40 %] 40 % Set Rate:  [18 bmp] 18 bmp Vt Set:  [400 mL] 400 mL PEEP:  [5 cmH20] 5 cmH20 Plateau Pressure:  [17 cmH20-25 cmH20] 17 cmH20   Intake/Output Summary (Last 24 hours) at 06/03/2022 0829 Last data filed at 06/03/2022 0700 Gross per 24 hour  Intake 2195.85 ml  Output 1325 ml  Net 870.85 ml    Filed Weights   06/01/22 0600 06/02/22 0443 06/03/22 0500  Weight: 63.2 kg 62.6 kg 63.4 kg   Physical Examination: No distress Anisocoria chronic Diminished breath sounds without wheezing Trach CDI Ext warm Following commands, globally weak  Sugars ok Sodium improved Stable CBC, will stop checking  Assessment & Plan:  Severe COPD- with RSV-induced flare now causing inability to wean from vent, s/p tracheostomy 05/29/22 Chronic anxiety/depression GERD Breast ca- on arimidex L cerebellar and L frontal stroke- age indeterminate; CTA head/neck with nothing actionable.  Tele w/o afib.  Cannot get MRI due to hardware. DM with hyperglycemia- improving with steroid taper Urinary retention- probable LAMA and SAMA combined  anticholinergic effect Anisocoria- chronic would stop imaging  - Continue clonidine, now off precedex - Continue buspar, klonipin dropped in half - Continue PTA zyprexa - Consider SSRI later this week - Prednisone 2.'5mg'$  daily - Brovana/yupelri - DC SAMA, LAMA - I/O caths, check UA, see if can avoid foley with stopping antimuscarinics - Work toward [employer] - Continue FWF - ASA, statin for stroke prevention - PT/OT  - Refused for LTACH, appeal underway  Best Practice (right click and "Reselect all SmartList Selections" daily)   Diet/type: TF DVT prophylaxis: LMWH GI prophylaxis: PPI Foley: Discontinue Code Status:  full code Last date of multidisciplinary goals of care discussion [1/6: Patient's family was updated at bedside  32 min cc time Erskine Emery MD PCCM

## 2022-06-04 DIAGNOSIS — J9622 Acute and chronic respiratory failure with hypercapnia: Secondary | ICD-10-CM | POA: Diagnosis not present

## 2022-06-04 DIAGNOSIS — J9621 Acute and chronic respiratory failure with hypoxia: Secondary | ICD-10-CM | POA: Diagnosis not present

## 2022-06-04 LAB — MAGNESIUM: Magnesium: 2.2 mg/dL (ref 1.7–2.4)

## 2022-06-04 LAB — GLUCOSE, CAPILLARY
Glucose-Capillary: 133 mg/dL — ABNORMAL HIGH (ref 70–99)
Glucose-Capillary: 135 mg/dL — ABNORMAL HIGH (ref 70–99)
Glucose-Capillary: 144 mg/dL — ABNORMAL HIGH (ref 70–99)
Glucose-Capillary: 148 mg/dL — ABNORMAL HIGH (ref 70–99)
Glucose-Capillary: 159 mg/dL — ABNORMAL HIGH (ref 70–99)
Glucose-Capillary: 187 mg/dL — ABNORMAL HIGH (ref 70–99)

## 2022-06-04 LAB — BASIC METABOLIC PANEL
Anion gap: 9 (ref 5–15)
BUN: 21 mg/dL (ref 8–23)
CO2: 33 mmol/L — ABNORMAL HIGH (ref 22–32)
Calcium: 8.2 mg/dL — ABNORMAL LOW (ref 8.9–10.3)
Chloride: 97 mmol/L — ABNORMAL LOW (ref 98–111)
Creatinine, Ser: 0.38 mg/dL — ABNORMAL LOW (ref 0.44–1.00)
GFR, Estimated: >60 mL/min (ref >60)
Glucose, Bld: 164 mg/dL — ABNORMAL HIGH (ref 70–99)
Potassium: 4.1 mmol/L (ref 3.5–5.1)
Sodium: 139 mmol/L (ref 135–145)

## 2022-06-04 LAB — PHOSPHORUS: Phosphorus: 3.1 mg/dL (ref 2.5–4.6)

## 2022-06-04 MED ORDER — LIP MEDEX EX OINT
TOPICAL_OINTMENT | CUTANEOUS | Status: DC | PRN
  Filled 2022-06-04: qty 7

## 2022-06-04 MED ORDER — CLONAZEPAM 1 MG PO TABS
1.0000 mg | ORAL_TABLET | Freq: Every day | ORAL | Status: DC
  Administered 2022-06-04 – 2022-07-03 (×26): 1 mg
  Filled 2022-06-04 (×13): qty 1
  Filled 2022-06-04: qty 2
  Filled 2022-06-04 (×13): qty 1

## 2022-06-04 NOTE — Progress Notes (Signed)
NAME:  Abigail Castillo, MRN:  [id number], DOB:  [birth year], LOS: 38 ADMISSION DATE:  05/19/2022, CONSULTATION DATE:  05/20/22 REFERRING MD:  Roger Shelter CHIEF COMPLAINT:  Dyspnea   History of Present Illness:  Abigail Castillo is a 72 y.o. female who has a PMH as below including but not limited to COPD on chronic 2-3L O2 and '10mg'$  Prednisone, OSA on nocturnal BiPAP, and who is followed by Dr. Lamonte Sakai in our office. She had recent admission 11/15 through 12/18 for AECOPD and was then discharged to [facility].  She was recently seen as an outpatient by Dr. Lamonte Sakai on 12/22 and was prescribed a slow prednisone taper with instructions to drop to '10mg'$  and continue until her next follow up appointment.  She presented back to [facility] ED 12/25 with dyspnea and hypoxia down to 65%. She was found to have hypoxic and hypercapnic respiratory failure and her RSV swab returned positive. She was placed on BiPAP and was admitted by [facility].  On 12/26, PCCM was consulted for assistance with BiPAP and COPD management.  Pertinent  Medical History:  has History of tobacco abuse; Depression; COPD (chronic obstructive pulmonary disease) ([place]); Osteoporosis; HOARSENESS, CHRONIC; HYPERTRIGLYCERIDEMIA; Memory change; B12 deficiency; Personal history of colonic polyps; Encounter for routine gynecological examination; Mild hyperlipidemia; Vitamin D deficiency; Esophageal stricture; GERD (gastroesophageal reflux disease); Schizoaffective disorder ([place]); Allergic rhinitis; Chronic respiratory failure ([place]); Acute on chronic respiratory failure with hypoxia ([place]); Elevated blood pressure reading without diagnosis of hypertension; Hepatic steatosis; Ductal carcinoma in situ (DCIS) of left breast; On home oxygen therapy; Hyponatremia; Bronchomalacia; Localized edema; COPD exacerbation ([place]); Tracheobronchomalacia; Acute on chronic respiratory failure with hypoxia and hypercapnia (HCC); COPD with acute exacerbation ([place]); RSV (respiratory syncytial  virus pneumonia); Essential hypertension; Malnutrition of moderate degree; Acute respiratory failure with hypercapnia ([place]); and Pressure injury of skin on their problem list.  Significant Hospital Events: Including procedures, antibiotic start and stop dates in addition to other pertinent events   12/25 admit. 12/26 PCCM consult. 1/2 Failed BiPAP wean, Intubated 1/3 CT Head negative was completed for observed pupillary change 1/4 tracheostomy 1/8 CT head for anisocoria; neg,   Interim History / Subjective:  No events. Tolerated TC x 6 hours yesterday. Denies pain or anxiety.  Objective:  Blood pressure 134/81, pulse (!) 105, temperature 98.5 F (36.9 C), temperature source Oral, resp. rate (!) 24, height '5\' 2"'$  (1.575 m), weight 62.2 kg, SpO2 98 %.    Vent Mode: PRVC FiO2 (%):  [40 %] 40 % Set Rate:  [18 bmp] 18 bmp Vt Set:  [400 mL] 400 mL PEEP:  [5 cmH20] 5 cmH20 Plateau Pressure:  [15 cmH20-16 cmH20] 16 cmH20   Intake/Output Summary (Last 24 hours) at 06/04/2022 1031 Last data filed at 06/04/2022 1000 Gross per 24 hour  Intake 2400 ml  Output 3075 ml  Net -675 ml    Filed Weights   06/02/22 0443 06/03/22 0500 06/04/22 0325  Weight: 62.6 kg 63.4 kg 62.2 kg   Physical Examination: No distress Anisocoria chronic Diminished breath sounds without wheezing Trach CDI Ext warm Following commands, globally weak, all unchanged  Sodium normalized Cr ok  Assessment & Plan:  Severe COPD- with RSV-induced flare now causing inability to wean from vent, s/p tracheostomy 05/29/22 Chronic anxiety/depression GERD Breast ca- on arimidex L cerebellar and L frontal stroke- age indeterminate; CTA head/neck with nothing actionable.  Tele w/o afib.  Cannot get MRI due to hardware. DM with hyperglycemia- improving with steroid taper Urinary retention- recurrent issue;  UA neg Anisocoria- chronic would stop imaging  - Continue clonidine, buspar, zyprexa (zyprexa is PTA med) - Klonipin  to qHS standing and BID PRN - Prednisone 2.'5mg'$  daily - Brovana/yupelri - Continue TC trials - Continue FWF - ASA, statin for stroke prevention - PT/OT  - Refused for LTACH, appeal underway  Erskine Emery MD PCCM

## 2022-06-04 NOTE — Care Management (Signed)
06-04-22 Case Manager received notification from [facility] regarding appeal. Appeal letter was submitted yesterday. [employer] will update Case Manager regarding decision. Case Manager will continue to follow for additional transition of care needs.

## 2022-06-05 DIAGNOSIS — J9622 Acute and chronic respiratory failure with hypercapnia: Secondary | ICD-10-CM | POA: Diagnosis not present

## 2022-06-05 DIAGNOSIS — J9621 Acute and chronic respiratory failure with hypoxia: Secondary | ICD-10-CM | POA: Diagnosis not present

## 2022-06-05 LAB — GLUCOSE, CAPILLARY
Glucose-Capillary: 143 mg/dL — ABNORMAL HIGH (ref 70–99)
Glucose-Capillary: 158 mg/dL — ABNORMAL HIGH (ref 70–99)
Glucose-Capillary: 157 mg/dL — ABNORMAL HIGH (ref 70–99)
Glucose-Capillary: 116 mg/dL — ABNORMAL HIGH (ref 70–99)
Glucose-Capillary: 145 mg/dL — ABNORMAL HIGH (ref 70–99)

## 2022-06-05 LAB — BASIC METABOLIC PANEL
Anion gap: 7 (ref 5–15)
BUN: 24 mg/dL — ABNORMAL HIGH (ref 8–23)
CO2: 36 mmol/L — ABNORMAL HIGH (ref 22–32)
Calcium: 8.7 mg/dL — ABNORMAL LOW (ref 8.9–10.3)
Chloride: 100 mmol/L (ref 98–111)
Creatinine, Ser: 0.33 mg/dL — ABNORMAL LOW (ref 0.44–1.00)
GFR, Estimated: >60 mL/min (ref >60)
Glucose, Bld: 153 mg/dL — ABNORMAL HIGH (ref 70–99)
Potassium: 4.3 mmol/L (ref 3.5–5.1)
Sodium: 143 mmol/L (ref 135–145)

## 2022-06-05 LAB — MAGNESIUM: Magnesium: 2.2 mg/dL (ref 1.7–2.4)

## 2022-06-05 LAB — PHOSPHORUS: Phosphorus: 3.6 mg/dL (ref 2.5–4.6)

## 2022-06-05 NOTE — Progress Notes (Signed)
Pt placed back on full vent support per MD. Pt tolerating well at this time, RN aware, RT will monitor.

## 2022-06-05 NOTE — Progress Notes (Signed)
Physical Therapy Treatment Patient Details Name: Abigail Castillo MRN: [id number] DOB: Mar 17, 1951 Today's Date: 06/05/2022   History of Present Illness Abigail Castillo is a 72 y.o. F recently admitted for COPD flare, discharged to SNF who returned due to SpO2 65%. Found to be  RSV+, pCO2 80, started on BiPAP.   Head CT showing possible subacute infarct of the medial left cerebellar hemisphere. Failed bipap wean on 1/2, intubated. Tracheostomy 1/4. Pt with COPD and chronic respiratory failure on 2-3L home O2, depression/anxiety and hx BrCA    PT Comments    Able to sit EOB with assist briefly but fatigues very quickly and has increased work of breathing. May need to have pt on vent with PT until incr activity tolerance.    Recommendations for follow up therapy are one component of a multi-disciplinary discharge planning process, led by the attending physician.  Recommendations may be updated based on patient status, additional functional criteria and insurance authorization.  Follow Up Recommendations  PT at Long-term acute care hospital Can patient physically be transported by private vehicle: No   Assistance Recommended at Discharge Frequent or constant Supervision/Assistance  Patient can return home with the following A lot of help with bathing/dressing/bathroom;Two people to help with walking and/or transfers   [facility] bed;Wheelchair (measurements PT);Wheelchair cushion (measurements PT);Other (comment) (hoyer lift)    Recommendations for Other Services       Precautions / Restrictions Precautions Precautions: Fall Precaution Comments: trach, vent vs trach collar Restrictions Weight Bearing Restrictions: No     Mobility  Bed Mobility Overal bed mobility: Needs Assistance Bed Mobility: Supine to Sit, Sit to Supine     Supine to sit: +2 for physical assistance, HOB elevated, Total assist Sit to supine: +2 for physical assistance, Total assist    General bed mobility comments: Assist for all aspects with pt fatiguing quickly with activity    Transfers                        Ambulation/Gait                   Stairs             Wheelchair Mobility    Modified Rankin (Stroke Patients Only)       Balance Overall balance assessment: Needs assistance Sitting-balance support: Feet supported, Bilateral upper extremity supported Sitting balance-Leahy Scale: Poor Sitting balance - Comments: Pt sat EOB x 5-6 minutes with min assist and BUE support on by knees in front of her.                                    Cognition Arousal/Alertness: Awake/alert Behavior During Therapy: Flat affect Overall Cognitive Status: Difficult to assess                                 General Comments: Pt following 1 step commands with verbal/tactile cues and incr time.        Exercises      General Comments General comments (skin integrity, edema, etc.): Pt on trach collar with SpO2 85-88%      Pertinent Vitals/Pain Pain Assessment Pain Assessment: Faces Faces Pain Scale: No hurt    Home Living  Prior Function            PT Goals (current goals can now be found in the care plan section) Acute Rehab PT Goals Patient Stated Goal: unable Progress towards PT goals: Not progressing toward goals - comment    Frequency    Min 2X/week      PT Plan Discharge plan needs to be updated    Co-evaluation              AM-PAC PT "6 Clicks" Mobility   Outcome Measure  Help needed turning from your back to your side while in a flat bed without using bedrails?: Total Help needed moving from lying on your back to sitting on the side of a flat bed without using bedrails?: Total Help needed moving to and from a bed to a chair (including a wheelchair)?: Total Help needed standing up from a chair using your arms (e.g., wheelchair or bedside  chair)?: Total Help needed to walk in hospital room?: Total Help needed climbing 3-5 steps with a railing? : Total 6 Click Score: 6    End of Session Equipment Utilized During Treatment: Oxygen Activity Tolerance: Patient limited by fatigue Patient left: in bed;with call bell/phone within reach;with bed alarm set Nurse Communication: Mobility status PT Visit Diagnosis: Difficulty in walking, not elsewhere classified (R26.2);Unsteadiness on feet (R26.81);Other abnormalities of gait and mobility (R26.89)     Time: 4801-6553 PT Time Calculation (min) (ACUTE ONLY): 13 min  Charges:  $Therapeutic Activity: 8-22 mins                     [place] 06/05/2022, 4:06 PM

## 2022-06-05 NOTE — Progress Notes (Signed)
Pt placed on trach collar 12L 40% by RT per MD. Pt tolerating, vitals stable, no incresed WOB noted, RN aware, RT will monitor     06/05/22 1452  Respiratory Assessment  Assessment Type Assess only  Respiratory Pattern Regular;Unlabored  Chest Assessment Chest expansion symmetrical  Cough Productive  Sputum Amount Small  Sputum Color Tan  Sputum Consistency Thick  Sputum Specimen Source Tracheostomy tube  Bilateral Breath Sounds Diminished;Rhonchi  Oxygen Therapy/Pulse Ox  O2 Device (S)  Tracheostomy Collar  O2 Therapy Oxygen humidified  O2 Flow Rate (L/min) 12 L/min  FiO2 (%) 40 %  Tracheostomy Shiley Flexible 6 mm Cuffed  Placement Date/Time: 05/29/22 1405   Placed By: ICU physician  Brand: Shiley Flexible  Size (mm): 6 mm  Style: Cuffed  Status Secured with trach ties and sutures  Site Assessment Clean;Dry  Ties Assessment Clean, Dry  Tracheostomy Equipment at bedside Yes and checklist posted at head of bed

## 2022-06-05 NOTE — Progress Notes (Signed)
Sutures removed from tracheostomy flange/ around stoma per protocol. Pt tolerated well

## 2022-06-05 NOTE — Progress Notes (Signed)
NAME:  Abigail Castillo, MRN:  [id number], DOB:  05-Dec-1950, LOS: 49 ADMISSION DATE:  05/19/2022, CONSULTATION DATE:  05/20/22 REFERRING MD:  Roger Shelter CHIEF COMPLAINT:  Dyspnea   History of Present Illness:  Abigail Castillo is a 72 y.o. female who has a PMH as below including but not limited to COPD on chronic 2-3L O2 and '10mg'$  Prednisone, OSA on nocturnal BiPAP, and who is followed by Dr. Lamonte Sakai in our office. She had recent admission 11/15 through 12/18 for AECOPD and was then discharged to [facility].  She was recently seen as an outpatient by Dr. Lamonte Sakai on 12/22 and was prescribed a slow prednisone taper with instructions to drop to '10mg'$  and continue until her next follow up appointment.  She presented back to [facility] ED 12/25 with dyspnea and hypoxia down to 65%. She was found to have hypoxic and hypercapnic respiratory failure and her RSV swab returned positive. She was placed on BiPAP and was admitted by [facility].  On 12/26, PCCM was consulted for assistance with BiPAP and COPD management.  Pertinent  Medical History:  has History of tobacco abuse; Depression; COPD (chronic obstructive pulmonary disease) ([place]); Osteoporosis; HOARSENESS, CHRONIC; HYPERTRIGLYCERIDEMIA; Memory change; B12 deficiency; Personal history of colonic polyps; Encounter for routine gynecological examination; Mild hyperlipidemia; Vitamin D deficiency; Esophageal stricture; GERD (gastroesophageal reflux disease); Schizoaffective disorder ([place]); Allergic rhinitis; Chronic respiratory failure ([place]); Acute on chronic respiratory failure with hypoxia ([place]); Elevated blood pressure reading without diagnosis of hypertension; Hepatic steatosis; Ductal carcinoma in situ (DCIS) of left breast; On home oxygen therapy; Hyponatremia; Bronchomalacia; Localized edema; COPD exacerbation ([place]); Tracheobronchomalacia; Acute on chronic respiratory failure with hypoxia and hypercapnia (HCC); COPD with acute exacerbation ([place]); RSV (respiratory syncytial  virus pneumonia); Essential hypertension; Malnutrition of moderate degree; Acute respiratory failure with hypercapnia ([place]); and Pressure injury of skin on their problem list.  Significant Hospital Events: Including procedures, antibiotic start and stop dates in addition to other pertinent events   12/25 admit. 12/26 PCCM consult. 1/2 Failed BiPAP wean, Intubated 1/3 CT Head negative was completed for observed pupillary change 1/4 tracheostomy 1/8 CT head for anisocoria; neg,   Interim History / Subjective:  Increased work of breathing at 2-hour mark of trach trial.  Objective:  Blood pressure 108/62, pulse 99, temperature 98.8 F (37.1 C), temperature source Oral, resp. rate (!) 21, height '5\' 2"'$  (1.575 m), weight 62.2 kg, SpO2 99 %.    Vent Mode: PRVC FiO2 (%):  [40 %] 40 % Set Rate:  [18 bmp] 18 bmp Vt Set:  [400 mL] 400 mL PEEP:  [5 cmH20] 5 cmH20 Pressure Support:  [8 cmH20] 8 cmH20 Plateau Pressure:  [20 cmH20-28 cmH20] 20 cmH20   Intake/Output Summary (Last 24 hours) at 06/05/2022 1352 Last data filed at 06/05/2022 1300 Gross per 24 hour  Intake 1630 ml  Output 1000 ml  Net 630 ml    Filed Weights   06/02/22 0443 06/03/22 0500 06/04/22 0325  Weight: 62.6 kg 63.4 kg 62.2 kg   Physical Examination: General: Chronically ill-appearing woman lying in bed HEENT tracheostomy in place with no bleeding. Pulmonary: Severely barrel chested.  Distant breath sounds with occasional prolonged wheezes. Cardiac: Heart sounds distant Abdomen: Small bore feeding tube in place.  Abdomen soft.  Fecal management system in place Extremities: Minimal edema. GU: External catheter in place Assessment & Plan:  Severe COPD- with RSV-induced flare now causing inability to wean from vent, s/p tracheostomy 05/29/22 Chronic anxiety/depression GERD Breast ca- on arimidex L cerebellar  and L frontal stroke- age indeterminate; CTA head/neck with nothing actionable.  Tele w/o afib.  Cannot get MRI  due to hardware. DM with hyperglycemia- improving with steroid taper Urinary retention- recurrent issue; UA neg Anisocoria- chronic would stop imaging  - Continue clonidine, buspar, zyprexa (zyprexa is PTA med) - Klonipin to qHS standing and BID PRN - Prednisone 2.'5mg'$  daily - Brovana/yupelri - Continue TC trials: 2-hour trials morning and afternoon with full rest in between.  Advance trial duration by 30 to 60 minutes every 2 days. - Continue FWF - ASA, statin for stroke prevention - PT/OT  - Refused for LTACH, appeal underway   Kipp Brood, MD [facility] ICU Physician [place]  Pager: [phone] Or Epic Secure Chat After hours: [phone].  06/05/2022, 1:56 PM

## 2022-06-05 NOTE — Progress Notes (Signed)
Pt placed on 12l 40% trach. Collar by RT. Pt tolerating well at this time, RN aware, vitals stable, no increased WOB noted, MD aware, RT will monitor.      06/05/22 0847  Respiratory Assessment  Assessment Type Assess only  Respiratory Pattern Regular;Unlabored  Chest Assessment Chest expansion symmetrical  Cough Productive  Sputum Amount Small  Sputum Color Tan  Sputum Consistency Thick  Sputum Specimen Source Tracheostomy tube  Bilateral Breath Sounds Diminished;Rhonchi  Oxygen Therapy/Pulse Ox  O2 Device (S)  Tracheostomy Collar  O2 Therapy Oxygen humidified  O2 Flow Rate (L/min) 12 L/min  FiO2 (%) 40 %  Tracheostomy Shiley Flexible 6 mm Cuffed  Placement Date/Time: 05/29/22 1405   Placed By: ICU physician  Brand: Shiley Flexible  Size (mm): 6 mm  Style: Cuffed  Status Secured with trach ties and sutures  Site Assessment Clean;Dry  Site Care Open to air  Inner Cannula Care Changed/new  Ties Assessment Clean, Dry  Cuff Pressure (cm H2O) Green OR 18-26 CmH2O  Tracheostomy Equipment at bedside Yes and checklist posted at head of bed  Respiratory  Airway LDA Tracheostomy

## 2022-06-05 NOTE — Progress Notes (Signed)
SLP Cancellation Note  Patient Details Name: Abigail Castillo MRN: [id number] DOB: Oct 16, 1950   Cancelled treatment:   SLP was unable to coordinate schedule to evaluate PMV while pt was on TC this morning. We will continue efforts.  [name] L. Tivis Ringer, MA CCC/SLP Clinical Specialist - Acute Care SLP Acute Rehabilitation Services Office number [phone]         [name] 06/05/2022, 1:34 PM

## 2022-06-05 NOTE — Progress Notes (Addendum)
Pt placed back on full vent support due to increased WOB and per MD. Pt tolerating well at this time, vitals stable, RN at bedside, MD at bedside,RT will attempt ATC again this afternoon per MD, RT will monitor.

## 2022-06-06 DIAGNOSIS — J9622 Acute and chronic respiratory failure with hypercapnia: Secondary | ICD-10-CM | POA: Diagnosis not present

## 2022-06-06 DIAGNOSIS — J9621 Acute and chronic respiratory failure with hypoxia: Secondary | ICD-10-CM | POA: Diagnosis not present

## 2022-06-06 LAB — GLUCOSE, CAPILLARY
Glucose-Capillary: 155 mg/dL — ABNORMAL HIGH (ref 70–99)
Glucose-Capillary: 158 mg/dL — ABNORMAL HIGH (ref 70–99)
Glucose-Capillary: 150 mg/dL — ABNORMAL HIGH (ref 70–99)
Glucose-Capillary: 141 mg/dL — ABNORMAL HIGH (ref 70–99)
Glucose-Capillary: 162 mg/dL — ABNORMAL HIGH (ref 70–99)
Glucose-Capillary: 155 mg/dL — ABNORMAL HIGH (ref 70–99)

## 2022-06-06 LAB — BASIC METABOLIC PANEL
Anion gap: 7 (ref 5–15)
BUN: 22 mg/dL (ref 8–23)
CO2: 36 mmol/L — ABNORMAL HIGH (ref 22–32)
Calcium: 8.5 mg/dL — ABNORMAL LOW (ref 8.9–10.3)
Chloride: 101 mmol/L (ref 98–111)
Creatinine, Ser: 0.41 mg/dL — ABNORMAL LOW (ref 0.44–1.00)
GFR, Estimated: >60 mL/min (ref >60)
Glucose, Bld: 161 mg/dL — ABNORMAL HIGH (ref 70–99)
Potassium: 4.4 mmol/L (ref 3.5–5.1)
Sodium: 144 mmol/L (ref 135–145)

## 2022-06-06 LAB — CBC WITH DIFFERENTIAL/PLATELET
Abs Immature Granulocytes: 0.03 10*3/uL (ref 0.00–0.07)
Basophils Absolute: 0 10*3/uL (ref 0.0–0.1)
Basophils Relative: 0 %
Eosinophils Absolute: 0.1 10*3/uL (ref 0.0–0.5)
Eosinophils Relative: 1 %
HCT: 31.1 % — ABNORMAL LOW (ref 36.0–46.0)
Hemoglobin: 9.4 g/dL — ABNORMAL LOW (ref 12.0–15.0)
Immature Granulocytes: 1 %
Lymphocytes Relative: 11 %
Lymphs Abs: 0.7 10*3/uL (ref 0.7–4.0)
MCH: 31.5 pg (ref 26.0–34.0)
MCHC: 30.2 g/dL (ref 30.0–36.0)
MCV: 104.4 fL — ABNORMAL HIGH (ref 80.0–100.0)
Monocytes Absolute: 0.3 10*3/uL (ref 0.1–1.0)
Monocytes Relative: 6 %
Neutro Abs: 4.9 10*3/uL (ref 1.7–7.7)
Neutrophils Relative %: 81 %
Platelets: 157 10*3/uL (ref 150–400)
RBC: 2.98 MIL/uL — ABNORMAL LOW (ref 3.87–5.11)
RDW: 14.9 % (ref 11.5–15.5)
WBC: 6 10*3/uL (ref 4.0–10.5)
nRBC: 0 % (ref 0.0–0.2)

## 2022-06-06 LAB — MAGNESIUM: Magnesium: 2.3 mg/dL (ref 1.7–2.4)

## 2022-06-06 LAB — PHOSPHORUS: Phosphorus: 3 mg/dL (ref 2.5–4.6)

## 2022-06-06 MED ORDER — CLONIDINE HCL 0.1 MG PO TABS
0.1000 mg | ORAL_TABLET | Freq: Two times a day (BID) | ORAL | Status: DC
  Administered 2022-06-06: 0.1 mg via ORAL
  Filled 2022-06-06: qty 1

## 2022-06-06 MED ORDER — CLONIDINE HCL 0.1 MG PO TABS: 0.1000 mg | ORAL_TABLET | Freq: Every day | ORAL | Status: DC

## 2022-06-06 MED ORDER — CLONIDINE HCL 0.1 MG PO TABS
0.1000 mg | ORAL_TABLET | Freq: Every day | ORAL | Status: AC
  Administered 2022-06-08: 0.1 mg
  Filled 2022-06-06: qty 1

## 2022-06-06 MED ORDER — CLONIDINE HCL 0.1 MG PO TABS
0.1000 mg | ORAL_TABLET | Freq: Two times a day (BID) | ORAL | Status: AC
  Administered 2022-06-07 (×2): 0.1 mg
  Filled 2022-06-06 (×2): qty 1

## 2022-06-06 MED ORDER — ACETAMINOPHEN 160 MG/5ML PO SOLN
650.0000 mg | Freq: Four times a day (QID) | ORAL | Status: DC | PRN
  Administered 2022-06-24: 650 mg
  Filled 2022-06-06 (×2): qty 20.3

## 2022-06-06 NOTE — TOC Progression Note (Signed)
Transition of Care [facility]) - Progression Note    Patient Details  Name: Abigail Castillo MRN: [id number] Date of Birth: [birth year]  Transition of Care [facility]) CM/SW Contact  Erenest Rasher, RN Phone Number: [phone] 06/06/2022, 9:47 AM  Clinical Narrative:     CM contacted [employer] rep, Anderson Malta to follow up on appeal for LTAC. States appeal has been submitted waiting on insurance approval.   Expected Discharge Plan: [place] (LTAC) Barriers to Discharge: Continued Medical Work up  Expected Discharge Plan and Services     Post Acute Care Choice: Long Term Acute Care (LTAC)                                         Social Determinants of Health (SDOH) Interventions SDOH Screenings   Food Insecurity: No Food Insecurity (05/30/2022)  Housing: Low Risk  (05/30/2022)  Transportation Needs: No Transportation Needs (05/30/2022)  Utilities: Not At Risk (05/30/2022)  Alcohol Screen: Low Risk  (03/26/2021)  Depression (PHQ2-9): Low Risk  (03/26/2021)  Financial Resource Strain: Low Risk  (03/26/2021)  Physical Activity: Inactive (03/26/2021)  Social Connections: Moderately Isolated (03/26/2021)  Stress: No Stress Concern Present (03/26/2021)  Tobacco Use: Medium Risk (05/19/2022)    Readmission Risk Interventions     No data to display

## 2022-06-06 NOTE — Progress Notes (Signed)
Pt placed on trach collar 12l/40% for afternoon 2 hour weaning per MD. Pt is tolerating well and vitals are stable. RT will monitor.

## 2022-06-06 NOTE — Evaluation (Signed)
Passy-Muir Speaking Valve - Evaluation Patient Details  Name: Abigail Castillo MRN: [id number] Date of Birth: [birth year]  Today's Date: 06/06/2022 Time: 0855-0927 SLP Time Calculation (min) (ACUTE ONLY): 32 min  Past Medical History:  Past Medical History:  Diagnosis Date   Anxiety    Blood transfusion    Blood transfusion without reported diagnosis    Breast cancer ([place]) 05/13/2021   Chronic mental illness    COPD (chronic obstructive pulmonary disease) ([place])    DEPRESSION 05/31/2009   Esophageal stricture 03/14/2015   GERD (gastroesophageal reflux disease)    Hepatic steatosis    Osteoporosis    Oxygen dependent    on O2 at 2-3L/min 24hr/day   Tobacco abuse    ongoing   Vitamin B 12 deficiency    Vitamin D deficiency    Past Surgical History:  Past Surgical History:  Procedure Laterality Date   ABDOMINAL HYSTERECTOMY     BREAST EXCISIONAL BIOPSY Left 2017   BREAST LUMPECTOMY  1972   left breast   BREAST LUMPECTOMY WITH RADIOACTIVE SEED LOCALIZATION Left 11/22/2015   Procedure: LEFT BREAST LUMPECTOMY WITH RADIOACTIVE SEED LOCALIZATION;  Surgeon: Erroll Luna, MD;  Location: [place];  Service: General;  Laterality: Left;   BREAST LUMPECTOMY WITH RADIOACTIVE SEED LOCALIZATION Left 07/03/2021   Procedure: LEFT BREAST LUMPECTOMY WITH RADIOACTIVE SEED LOCALIZATION;  Surgeon: Erroll Luna, MD;  Location: [place];  Service: General;  Laterality: Left;   BREAST SURGERY  ?2015   lumpectomy, left   CERVICAL CONE BIOPSY     CHOLECYSTECTOMY     COLONOSCOPY     ESOPHAGOGASTRODUODENOSCOPY (EGD) WITH ESOPHAGEAL DILATION     UPPER GASTROINTESTINAL ENDOSCOPY     HPI:  Abigail Castillo is a 72 y.o. F recently admitted for COPD flare, discharged to SNF who returned 12/25 due to SpO2 65%. Found to be RSV+, pCO2 80, started on BiPAP. Head CT showing possible subacute infarct of the medial left cerebellar hemisphere. Initial bedside swallow eval 1/1 WFL. Failed bipap wean on 1/2, intubated.  Tracheostomy 1/4. Pt with COPD and chronic respiratory failure on 2-3L home O2, depression/anxiety and hx BrCA    Assessment / Plan / Recommendation  Clinical Impression  Pt was on TC with cuff already deflated. PMV was placed with immediate phonation achieved that was rough in quality with at least mildly reduced breath support. She was given cues to over articulate as this may also be beneficial for times when she is on the vent. Pt says she has primarily been mouthing, but picture board in room was presented to attempt multimodal communication. Pt had the most success with glasses donned but the board still had too many selections. She may benefit from further assessment of this if she continues to remain on the vent for most of the day. She wore her PMV well for over 20 minutes with no overt signs of intolerance, but has only been spending ~1-2 hours a day on TC, which will limit her ability to use the valve for now. Recommend using PMV whenever off the vent when full supervision can be provided. Will f/u for additional assessment of swallowing hopefully as pt is able to remain on TC a little longer. SLP Visit Diagnosis: Aphonia (R49.1)    SLP Assessment  Patient needs continued Speech [place] [facility]ology Services    Recommendations for follow up therapy are one component of a multi-disciplinary discharge planning process, led by the attending physician.  Recommendations may be updated based on patient status, additional functional  criteria and insurance authorization.  Follow Up Recommendations  SLP at Long-term acute care hospital    Assistance Recommended at Discharge Frequent or constant Supervision/Assistance  Functional Status Assessment Patient has had a recent decline in their functional status and demonstrates the ability to make significant improvements in function in a reasonable and predictable amount of time.  Frequency and Duration min 2x/week  2 weeks    PMSV Trial PMSV was  placed for: 20+ minutes Able to redirect subglottic air through upper airway: Yes Able to Attain Phonation: Yes Voice Quality: Low vocal intensity;Hoarse Able to Expectorate Secretions: No attempts Level of Secretion Expectoration with PMSV: Not observed Breath Support for Phonation: Mildly decreased Intelligibility: Intelligibility reduced Sentence: 75-100% accurate Respirations During Trial: 22 (20-26) SpO2 During Trial: 96 % Pulse During Trial: 107 Behavior: Alert;Cooperative;Responsive to questions   Tracheostomy Tube       Vent Dependency  Vent Mode: PRVC Set Rate: 18 bmp PEEP: 5 cmH20 FiO2 (%): 40 % Vt Set: 400 mL    Cuff Deflation Trial Tolerated Cuff Deflation:  (deflated at baseline)         Osie Bond., M.A. [place] Office [phone]  Secure chat preferred  06/06/2022, 10:26 AM

## 2022-06-06 NOTE — Progress Notes (Signed)
NAME:  Abigail Castillo, MRN:  [id number], DOB:  [birth year], LOS: 38 ADMISSION DATE:  05/19/2022, CONSULTATION DATE:  05/20/22 REFERRING MD:  Roger Shelter CHIEF COMPLAINT:  Dyspnea   History of Present Illness:  Abigail Castillo is a 72 y.o. female who has a PMH as below including but not limited to COPD on chronic 2-3L O2 and '10mg'$  Prednisone, OSA on nocturnal BiPAP, and who is followed by Dr. Lamonte Sakai in our office. She had recent admission 11/15 through 12/18 for AECOPD and was then discharged to [facility].  She was recently seen as an outpatient by Dr. Lamonte Sakai on 12/22 and was prescribed a slow prednisone taper with instructions to drop to '10mg'$  and continue until her next follow up appointment.  She presented back to [facility] ED 12/25 with dyspnea and hypoxia down to 65%. She was found to have hypoxic and hypercapnic respiratory failure and her RSV swab returned positive. She was placed on BiPAP and was admitted by [facility].  On 12/26, PCCM was consulted for assistance with BiPAP and COPD management.  Pertinent  Medical History:  has History of tobacco abuse; Depression; COPD (chronic obstructive pulmonary disease) ([place]); Osteoporosis; HOARSENESS, CHRONIC; HYPERTRIGLYCERIDEMIA; Memory change; B12 deficiency; Personal history of colonic polyps; Encounter for routine gynecological examination; Mild hyperlipidemia; Vitamin D deficiency; Esophageal stricture; GERD (gastroesophageal reflux disease); Schizoaffective disorder ([place]); Allergic rhinitis; Chronic respiratory failure ([place]); Acute on chronic respiratory failure with hypoxia ([place]); Elevated blood pressure reading without diagnosis of hypertension; Hepatic steatosis; Ductal carcinoma in situ (DCIS) of left breast; On home oxygen therapy; Hyponatremia; Bronchomalacia; Localized edema; COPD exacerbation ([place]); Tracheobronchomalacia; Acute on chronic respiratory failure with hypoxia and hypercapnia (HCC); COPD with acute exacerbation ([place]); RSV (respiratory syncytial  virus pneumonia); Essential hypertension; Malnutrition of moderate degree; Acute respiratory failure with hypercapnia ([place]); and Pressure injury of skin on their problem list.  Significant Hospital Events: Including procedures, antibiotic start and stop dates in addition to other pertinent events   12/25 admit. 12/26 PCCM consult. 1/2 Failed BiPAP wean, Intubated 1/3 CT Head negative was completed for observed pupillary change 1/4 tracheostomy 1/8 CT head for anisocoria; neg,  1/11 continuing progressive trach collar trials  Interim History / Subjective:  Continuing bid trach collar trials.  She appears more comfortable today  Objective:  Blood pressure (!) 92/59, pulse 90, temperature 98.4 F (36.9 C), temperature source Oral, resp. rate 18, height '5\' 2"'$  (1.575 m), weight 62.2 kg, SpO2 100 %.    Vent Mode: PRVC FiO2 (%):  [40 %] 40 % Set Rate:  [18 bmp] 18 bmp Vt Set:  [400 mL] 400 mL PEEP:  [5 cmH20] 5 cmH20 Plateau Pressure:  [18 cmH20-21 cmH20] 18 cmH20   Intake/Output Summary (Last 24 hours) at 06/06/2022 1225 Last data filed at 06/06/2022 1100 Gross per 24 hour  Intake 1060 ml  Output 1350 ml  Net -290 ml    Filed Weights   06/02/22 0443 06/03/22 0500 06/04/22 0325  Weight: 62.6 kg 63.4 kg 62.2 kg   Physical Examination: General: Chronically ill-appearing woman lying in bed HEENT tracheostomy in place with no bleeding. Pulmonary: Severely barrel chested.  Distant breath sounds with occasional prolonged wheezes. Cardiac: Heart sounds distant Abdomen: Small bore feeding tube in place.  Abdomen soft.  Fecal management system in place Extremities: Minimal edema. GU: External catheter in place  Assessment & Plan:  Severe COPD- with RSV-induced flare now causing inability to wean from vent, s/p tracheostomy 05/29/22 Chronic anxiety/depression GERD Breast ca- on arimidex L  cerebellar and L frontal stroke- age indeterminate; CTA head/neck with nothing actionable.  Tele  w/o afib.  Cannot get MRI due to hardware. DM with hyperglycemia- improving with steroid taper Urinary retention- recurrent issue; UA neg Anisocoria- chronic would stop imaging  - Continue clonidine, buspar, zyprexa (zyprexa is PTA med) - Klonipin to qHS standing and BID PRN - Prednisone 2.'5mg'$  daily - Brovana/yupelri - Continue TC trials: 2-hour trials morning and afternoon with full rest in between.  Advance trial duration by 30 to 60 minutes every 2 days. - Continue FWF - ASA, statin for stroke prevention - PT/OT  -SLP will continue to work with Passy-Muir valve as patient spends longer periods of time on trach collar. - Refused for LTACH, appeal underway   Kipp Brood, MD [facility] ICU Physician [place]  Pager: [phone] Or Epic Secure Chat After hours: [phone].  06/06/2022, 12:25 PM

## 2022-06-06 NOTE — Progress Notes (Signed)
OT Cancellation Note  Patient Details Name: Abigail Castillo MRN: [id number] DOB: [birth year]   Cancelled Treatment:    Reason Eval/Treat Not Completed:  (Pt on trach collar, lethargic from meds. Will continue to follow.)  [name] 06/06/2022, 4:10 PM Cleta Alberts, OTR/L [place] Office: [phone]

## 2022-06-06 NOTE — Progress Notes (Signed)
Pt placed back on full support on the vent after AM 2 hour ATC. RT will monitor.

## 2022-06-07 DIAGNOSIS — J9622 Acute and chronic respiratory failure with hypercapnia: Secondary | ICD-10-CM | POA: Diagnosis not present

## 2022-06-07 DIAGNOSIS — J9621 Acute and chronic respiratory failure with hypoxia: Secondary | ICD-10-CM | POA: Diagnosis not present

## 2022-06-07 LAB — BASIC METABOLIC PANEL
Anion gap: 10 (ref 5–15)
BUN: 26 mg/dL — ABNORMAL HIGH (ref 8–23)
CO2: 36 mmol/L — ABNORMAL HIGH (ref 22–32)
Calcium: 8.7 mg/dL — ABNORMAL LOW (ref 8.9–10.3)
Chloride: 100 mmol/L (ref 98–111)
Creatinine, Ser: 0.35 mg/dL — ABNORMAL LOW (ref 0.44–1.00)
GFR, Estimated: >60 mL/min (ref >60)
Glucose, Bld: 156 mg/dL — ABNORMAL HIGH (ref 70–99)
Potassium: 4.2 mmol/L (ref 3.5–5.1)
Sodium: 146 mmol/L — ABNORMAL HIGH (ref 135–145)

## 2022-06-07 LAB — GLUCOSE, CAPILLARY
Glucose-Capillary: 121 mg/dL — ABNORMAL HIGH (ref 70–99)
Glucose-Capillary: 131 mg/dL — ABNORMAL HIGH (ref 70–99)
Glucose-Capillary: 140 mg/dL — ABNORMAL HIGH (ref 70–99)
Glucose-Capillary: 165 mg/dL — ABNORMAL HIGH (ref 70–99)
Glucose-Capillary: 169 mg/dL — ABNORMAL HIGH (ref 70–99)
Glucose-Capillary: 172 mg/dL — ABNORMAL HIGH (ref 70–99)

## 2022-06-07 LAB — MAGNESIUM: Magnesium: 2.3 mg/dL (ref 1.7–2.4)

## 2022-06-07 LAB — PHOSPHORUS: Phosphorus: 3.9 mg/dL (ref 2.5–4.6)

## 2022-06-07 NOTE — Progress Notes (Signed)
Placed back on full support per NP request

## 2022-06-07 NOTE — Progress Notes (Signed)
NAME:  Abigail Castillo, MRN:  [id number], DOB:  [birth year], LOS: 76 ADMISSION DATE:  05/19/2022, CONSULTATION DATE:  05/20/22 REFERRING MD:  Roger Shelter CHIEF COMPLAINT:  Dyspnea   History of Present Illness:  Abigail Castillo is a 72 y.o. female who has a PMH as below including but not limited to COPD on chronic 2-3L O2 and '10mg'$  Prednisone, OSA on nocturnal BiPAP, and who is followed by Dr. Lamonte Sakai in our office. She had recent admission 11/15 through 12/18 for AECOPD and was then discharged to [facility].  She was recently seen as an outpatient by Dr. Lamonte Sakai on 12/22 and was prescribed a slow prednisone taper with instructions to drop to '10mg'$  and continue until her next follow up appointment.  She presented back to [facility] ED 12/25 with dyspnea and hypoxia down to 65%. She was found to have hypoxic and hypercapnic respiratory failure and her RSV swab returned positive. She was placed on BiPAP and was admitted by [facility].  On 12/26, PCCM was consulted for assistance with BiPAP and COPD management.  Pertinent  Medical History:  has History of tobacco abuse; Depression; COPD (chronic obstructive pulmonary disease) ([place]); Osteoporosis; HOARSENESS, CHRONIC; HYPERTRIGLYCERIDEMIA; Memory change; B12 deficiency; Personal history of colonic polyps; Encounter for routine gynecological examination; Mild hyperlipidemia; Vitamin D deficiency; Esophageal stricture; GERD (gastroesophageal reflux disease); Schizoaffective disorder ([place]); Allergic rhinitis; Chronic respiratory failure ([place]); Acute on chronic respiratory failure with hypoxia ([place]); Elevated blood pressure reading without diagnosis of hypertension; Hepatic steatosis; Ductal carcinoma in situ (DCIS) of left breast; On home oxygen therapy; Hyponatremia; Bronchomalacia; Localized edema; COPD exacerbation ([place]); Tracheobronchomalacia; Acute on chronic respiratory failure with hypoxia and hypercapnia (HCC); COPD with acute exacerbation ([place]); RSV (respiratory syncytial  virus pneumonia); Essential hypertension; Malnutrition of moderate degree; Acute respiratory failure with hypercapnia ([place]); and Pressure injury of skin on their problem list.  Significant Hospital Events: Including procedures, antibiotic start and stop dates in addition to other pertinent events   12/25 admit. 12/26 PCCM consult. 1/2 Failed BiPAP wean, Intubated 1/3 CT Head negative was completed for observed pupillary change 1/4 tracheostomy 1/8 CT head for anisocoria; neg,  1/11 continuing progressive trach collar trials 1/12 BID trach collar trials, appears comfortable   Interim History / Subjective:  Afebrile  On ATC 35%, weaned from 0800 to noon on ATC Glucose trend 131-172 I/O 1.2L UOP, essentially even in last 24 hours   Objective:  Blood pressure 120/65, pulse (!) 118, temperature 97.8 F (36.6 C), temperature source Oral, resp. rate (!) 21, height '5\' 2"'$  (1.575 m), weight 63.8 kg, SpO2 94 %.    Vent Mode: PRVC FiO2 (%):  [35 %-40 %] 35 % Set Rate:  [18 bmp] 18 bmp Vt Set:  [400 mL] 400 mL PEEP:  [5 cmH20] 5 cmH20 Plateau Pressure:  [15 cmH20-19 cmH20] 19 cmH20   Intake/Output Summary (Last 24 hours) at 06/07/2022 1150 Last data filed at 06/07/2022 0900 Gross per 24 hour  Intake 1383 ml  Output 1250 ml  Net 133 ml   Filed Weights   06/03/22 0500 06/04/22 0325 06/07/22 0426  Weight: 63.4 kg 62.2 kg 63.8 kg   Physical Examination: General: chronically ill appearing adult female lying in bed in NAD HEENT: MM pink/moist, trach midline c/d/i, anicteric Neuro: Awake, alert, appears tired, nods/interacts with staff CV: s1s2 RRR, ST on monitor , no m/r/g PULM: mild accessory muscle use, lungs bilaterally diminished  GI: soft, bsx4 active  Extremities: warm/dry, no edema  Skin: no rashes or lesions  Assessment & Plan:   Severe COPD- with RSV-induced flare now causing inability to wean from vent, s/p tracheostomy 05/29/22 Chronic anxiety/depression GERD Breast ca-  on arimidex L cerebellar and L frontal stroke- age indeterminate; CTA head/neck with nothing actionable.  Tele w/o afib.  Cannot get MRI due to hardware. DM with hyperglycemia- improving with steroid taper Urinary retention- recurrent issue; UA neg Anisocoria- chronic would stop imaging  -ATC trials, advance to 2-4 hour trial interval with equal rest in between, advance trial duration by 1h every 2 days -put back on PSV for few hours now given accessory muscle use, retrial later in afternoon  -prednisone 2.'5mg'$  QD -continue brovana, yupelri -klonopin QHS and BID PRN  -continue clonidine, buspar, zyprexa (zyprexa is PTA med) -PT/OT/SLP efforts  -mobilize  -ASA & for stroke prevention  -continue free water flushes  -refused for LTACH by insurance, appeal underway -follow glucose trend, SSI + semglee      Noe Gens, MSN, APRN, NP-C, AGACNP-BC  Pulmonary & Critical Care 06/07/2022, 11:50 AM   Please see [email or web address] for pager details.   From 7A-7P if no response, please call [phone] After hours, please call ELink [phone]

## 2022-06-08 ENCOUNTER — Inpatient Hospital Stay (HOSPITAL_COMMUNITY): Payer: 59

## 2022-06-08 DIAGNOSIS — J9621 Acute and chronic respiratory failure with hypoxia: Secondary | ICD-10-CM | POA: Diagnosis not present

## 2022-06-08 DIAGNOSIS — J9622 Acute and chronic respiratory failure with hypercapnia: Secondary | ICD-10-CM | POA: Diagnosis not present

## 2022-06-08 DIAGNOSIS — Z930 Tracheostomy status: Secondary | ICD-10-CM

## 2022-06-08 LAB — BASIC METABOLIC PANEL
Anion gap: 11 (ref 5–15)
BUN: 26 mg/dL — ABNORMAL HIGH (ref 8–23)
CO2: 34 mmol/L — ABNORMAL HIGH (ref 22–32)
Calcium: 9 mg/dL (ref 8.9–10.3)
Chloride: 103 mmol/L (ref 98–111)
Creatinine, Ser: 0.41 mg/dL — ABNORMAL LOW (ref 0.44–1.00)
GFR, Estimated: >60 mL/min (ref >60)
Glucose, Bld: 150 mg/dL — ABNORMAL HIGH (ref 70–99)
Potassium: 4.6 mmol/L (ref 3.5–5.1)
Sodium: 148 mmol/L — ABNORMAL HIGH (ref 135–145)

## 2022-06-08 LAB — GLUCOSE, CAPILLARY
Glucose-Capillary: 138 mg/dL — ABNORMAL HIGH (ref 70–99)
Glucose-Capillary: 143 mg/dL — ABNORMAL HIGH (ref 70–99)
Glucose-Capillary: 149 mg/dL — ABNORMAL HIGH (ref 70–99)
Glucose-Capillary: 153 mg/dL — ABNORMAL HIGH (ref 70–99)
Glucose-Capillary: 157 mg/dL — ABNORMAL HIGH (ref 70–99)
Glucose-Capillary: 158 mg/dL — ABNORMAL HIGH (ref 70–99)
Glucose-Capillary: 161 mg/dL — ABNORMAL HIGH (ref 70–99)

## 2022-06-08 MED ORDER — TORSEMIDE 20 MG PO TABS
10.0000 mg | ORAL_TABLET | Freq: Every day | ORAL | Status: DC
  Administered 2022-06-09 – 2022-06-19 (×9): 10 mg
  Filled 2022-06-08 (×9): qty 1

## 2022-06-08 MED ORDER — DIAZEPAM 5 MG/ML IJ SOLN
5.0000 mg | Freq: Once | INTRAMUSCULAR | Status: AC
  Administered 2022-06-08: 5 mg via INTRAVENOUS
  Filled 2022-06-08: qty 2

## 2022-06-08 MED ORDER — BANATROL TF EN LIQD
60.0000 mL | Freq: Two times a day (BID) | ENTERAL | Status: DC
  Administered 2022-06-08 – 2022-06-24 (×29): 60 mL
  Filled 2022-06-08 (×29): qty 60

## 2022-06-08 MED ORDER — FUROSEMIDE 10 MG/ML IJ SOLN
40.0000 mg | Freq: Once | INTRAMUSCULAR | Status: AC
  Administered 2022-06-08: 40 mg via INTRAVENOUS
  Filled 2022-06-08: qty 4

## 2022-06-08 NOTE — Progress Notes (Signed)
NAME:  Abigail Castillo, MRN:  [id number], DOB:  Aug 15, 1950, LOS: 78 ADMISSION DATE:  05/19/2022, CONSULTATION DATE:  05/20/22 REFERRING MD:  Roger Shelter CHIEF COMPLAINT:  Dyspnea   History of Present Illness:  Abigail Castillo is a 72 y.o. female who has a PMH as below including but not limited to COPD on chronic 2-3L O2 and '10mg'$  Prednisone, OSA on nocturnal BiPAP, and who is followed by Dr. Lamonte Sakai in our office. She had recent admission 11/15 through 12/18 for AECOPD and was then discharged to [facility].  She was recently seen as an outpatient by Dr. Lamonte Sakai on 12/22 and was prescribed a slow prednisone taper with instructions to drop to '10mg'$  and continue until her next follow up appointment.  She presented back to [facility] ED 12/25 with dyspnea and hypoxia down to 65%. She was found to have hypoxic and hypercapnic respiratory failure and her RSV swab returned positive. She was placed on BiPAP and was admitted by [facility].  On 12/26, PCCM was consulted for assistance with BiPAP and COPD management.  Pertinent  Medical History:  has History of tobacco abuse; Depression; COPD (chronic obstructive pulmonary disease) ([place]); Osteoporosis; HOARSENESS, CHRONIC; HYPERTRIGLYCERIDEMIA; Memory change; B12 deficiency; Personal history of colonic polyps; Encounter for routine gynecological examination; Mild hyperlipidemia; Vitamin D deficiency; Esophageal stricture; GERD (gastroesophageal reflux disease); Schizoaffective disorder ([place]); Allergic rhinitis; Chronic respiratory failure ([place]); Acute on chronic respiratory failure with hypoxia ([place]); Elevated blood pressure reading without diagnosis of hypertension; Hepatic steatosis; Ductal carcinoma in situ (DCIS) of left breast; On home oxygen therapy; Hyponatremia; Bronchomalacia; Localized edema; COPD exacerbation ([place]); Tracheobronchomalacia; Acute on chronic respiratory failure with hypoxia and hypercapnia (HCC); COPD with acute exacerbation ([place]); RSV (respiratory syncytial  virus pneumonia); Essential hypertension; Malnutrition of moderate degree; Acute respiratory failure with hypercapnia ([place]); and Pressure injury of skin on their problem list.  Significant Hospital Events: Including procedures, antibiotic start and stop dates in addition to other pertinent events   12/25 admit. 12/26 PCCM consult. 1/2 Failed BiPAP wean, Intubated 1/3 CT Head negative was completed for observed pupillary change 1/4 tracheostomy 1/8 CT head for anisocoria; neg,  1/11 continuing progressive trach collar trials 1/12 BID trach collar trials, appears comfortable  1/14 failed PSV  Interim History / Subjective:  Failed PSV Anxiety incr  Objective:  Blood pressure 115/62, pulse (!) 114, temperature 98.3 F (36.8 C), temperature source Oral, resp. rate (!) 25, height '5\' 2"'$  (1.575 m), weight 63.8 kg, SpO2 98 %.    Vent Mode: PRVC FiO2 (%):  [30 %-40 %] 30 % Set Rate:  [18 bmp] 18 bmp Vt Set:  [400 mL] 400 mL PEEP:  [5 cmH20] 5 cmH20 Pressure Support:  [8 cmH20] 8 cmH20 Plateau Pressure:  [14 cmH20-19 cmH20] 16 cmH20   Intake/Output Summary (Last 24 hours) at 06/08/2022 1129 Last data filed at 06/08/2022 0900 Gross per 24 hour  Intake 1110 ml  Output 1475 ml  Net -365 ml   Filed Weights   06/03/22 0500 06/04/22 0325 06/07/22 0426  Weight: 63.4 kg 62.2 kg 63.8 kg   Physical Examination: General: chronically ill trach/vent  HEENT: trach secure  Neuro: Tired, folloiwing commands  CV: rr cap refill brisk  PULM: diminished air movement. Mechanically ventilated  GI: soft ndnt  Extremities: no acute joint deformity  Skin: c/d/w    Assessment & Plan:   Acute on chronic respiratory failure with hypoxia/hypercarbia Severe COPD with RSV-induced flare now causing inability to wean from vent, s/p tracheostomy 05/29/22  For 1/14 -- intolerant of PSV, back on full support while we address anxiety. Will check a CXR    -Big picture goal is ATC trials, advance to 2-4 hour  trial interval with equal rest in between, advance trial duration by 1h every 2 days -prednisone 2.'5mg'$  QD -continue brovana, yupelri -PT/OT/SLP efforts  -ASA & for stroke prevention  -continue free water flushes  -LTACH appeal underway  Chronic anxiety/depression -continue clonidine, buspar, zyprexa  -adding a 1x valium for anxiety  -klonopin QHS and BID PRN   GERD Breast ca- on arimidex  L cerebellar and L frontal stroke- age indeterminate; CTA head/neck with nothing actionable.  Tele w/o afib.  Cannot get MRI due to hardware.  DM with hyperglycemia-follow glucose trend, SSI + semglee   Urinary retention- recurrent issue; UA neg  Anisocoria- chronic       Eliseo Gum MSN, AGACNP-BC [place] for pager  06/08/2022, 11:29 AM

## 2022-06-08 NOTE — Progress Notes (Signed)
Placed pt back on full support at this time due to sats dropping to the 70's. RT will attempt to wean pt later in the day if pt will tolerate wean.

## 2022-06-09 ENCOUNTER — Inpatient Hospital Stay: Payer: Medicare Other | Admitting: Primary Care

## 2022-06-09 DIAGNOSIS — J9621 Acute and chronic respiratory failure with hypoxia: Secondary | ICD-10-CM | POA: Diagnosis not present

## 2022-06-09 DIAGNOSIS — J9602 Acute respiratory failure with hypercapnia: Secondary | ICD-10-CM | POA: Diagnosis not present

## 2022-06-09 DIAGNOSIS — J9622 Acute and chronic respiratory failure with hypercapnia: Secondary | ICD-10-CM | POA: Diagnosis not present

## 2022-06-09 DIAGNOSIS — J441 Chronic obstructive pulmonary disease with (acute) exacerbation: Secondary | ICD-10-CM | POA: Diagnosis not present

## 2022-06-09 LAB — GLUCOSE, CAPILLARY
Glucose-Capillary: 138 mg/dL — ABNORMAL HIGH (ref 70–99)
Glucose-Capillary: 143 mg/dL — ABNORMAL HIGH (ref 70–99)
Glucose-Capillary: 160 mg/dL — ABNORMAL HIGH (ref 70–99)
Glucose-Capillary: 129 mg/dL — ABNORMAL HIGH (ref 70–99)
Glucose-Capillary: 237 mg/dL — ABNORMAL HIGH (ref 70–99)

## 2022-06-09 MED ORDER — FAMOTIDINE 40 MG/5ML PO SUSR: 40.0000 mg | Freq: Every day | ORAL | Status: DC

## 2022-06-09 MED ORDER — FAMOTIDINE 40 MG/5ML PO SUSR
20.0000 mg | Freq: Every day | ORAL | Status: DC
  Administered 2022-06-09 – 2022-07-03 (×23): 20 mg
  Filled 2022-06-09 (×26): qty 2.5

## 2022-06-09 MED ORDER — PREDNISONE 20 MG PO TABS
40.0000 mg | ORAL_TABLET | Freq: Every day | ORAL | Status: DC
  Administered 2022-06-10 – 2022-06-13 (×4): 40 mg
  Filled 2022-06-09 (×4): qty 2

## 2022-06-09 MED ORDER — PREDNISONE 20 MG PO TABS
40.0000 mg | ORAL_TABLET | Freq: Once | ORAL | Status: AC
  Administered 2022-06-09: 40 mg via ORAL
  Filled 2022-06-09: qty 2

## 2022-06-09 MED ORDER — FREE WATER
200.0000 mL | Freq: Four times a day (QID) | Status: DC
  Administered 2022-06-09 – 2022-06-10 (×3): 200 mL

## 2022-06-09 NOTE — Progress Notes (Signed)
Physical Therapy Treatment Patient Details Name: Abigail Castillo MRN: [id number] DOB: 1950/12/19 Today's Date: 06/09/2022   History of Present Illness Mrs. Haughn is a 72 y.o. F recently admitted for COPD flare, discharged to SNF who returned due to SpO2 65%. Found to be  RSV+, pCO2 80, started on BiPAP.   Head CT showing possible subacute infarct of the medial left cerebellar hemisphere. Failed bipap wean on 1/2, intubated. Tracheostomy 1/4. Pt with COPD and chronic respiratory failure on 2-3L home O2, depression/anxiety and hx BrCA    PT Comments    Pt progressing slowly towards her physical therapy goals. Pt seen on full vent support. Remains lethargic; alertness improved somewhat by placing bed in chair position. Worked on [employer] exercises, functional reaching and core/trunk activation. Will continue to progress as tolerated.   Recommendations for follow up therapy are one component of a multi-disciplinary discharge planning process, led by the attending physician.  Recommendations may be updated based on patient status, additional functional criteria and insurance authorization.  Follow Up Recommendations  PT at Long-term acute care hospital Can patient physically be transported by private vehicle: No   Assistance Recommended at Discharge Frequent or constant Supervision/Assistance  Patient can return home with the following A lot of help with bathing/dressing/bathroom;Two people to help with walking and/or transfers   [facility] bed;Wheelchair (measurements PT);Wheelchair cushion (measurements PT);Other (comment) (hoyer lift)    Recommendations for Other Services       Precautions / Restrictions Precautions Precautions: Fall Precaution Comments: trach, vent vs trach collar Restrictions Weight Bearing Restrictions: No     Mobility  Bed Mobility Overal bed mobility: Needs Assistance Bed Mobility: Rolling Rolling: Min guard               Transfers                   General transfer comment: deferred    Ambulation/Gait                   Stairs             Wheelchair Mobility    Modified Rankin (Stroke Patients Only)       Balance                                            Cognition Arousal/Alertness: Awake/alert Behavior During Therapy: Flat affect Overall Cognitive Status: Difficult to assess                                 General Comments: Level of arousal impacting command following        Exercises General Exercises - Upper Extremity Shoulder Flexion: AAROM, Both, 10 reps General Exercises - Lower Extremity Ankle Circles/Pumps: PROM, Both, 10 reps, Supine Long Arc Quad: AAROM, Both, 10 reps, Other (comment) (bed in chair position) Heel Slides: AAROM, Both, 10 reps, Supine Hip ABduction/ADduction: AAROM, Both, 10 reps, Supine Other Exercises Other Exercises: Bed in chair position: sit ups pulling on bed rail x 3, functional reaching with BUE's    General Comments        Pertinent Vitals/Pain Pain Assessment Pain Assessment: No/denies pain    Home Living  Prior Function            PT Goals (current goals can now be found in the care plan section) Acute Rehab PT Goals Patient Stated Goal: unable Time For Goal Achievement: 06/23/22 Potential to Achieve Goals: Fair    Frequency    Min 2X/week      PT Plan Current plan remains appropriate    Co-evaluation              AM-PAC PT "6 Clicks" Mobility   Outcome Measure  Help needed turning from your back to your side while in a flat bed without using bedrails?: A Little Help needed moving from lying on your back to sitting on the side of a flat bed without using bedrails?: A Lot Help needed moving to and from a bed to a chair (including a wheelchair)?: A Lot Help needed standing up from a chair using your arms (e.g., wheelchair  or bedside chair)?: Total Help needed to walk in hospital room?: Total Help needed climbing 3-5 steps with a railing? : Total 6 Click Score: 10    End of Session Equipment Utilized During Treatment: Oxygen Activity Tolerance: Patient limited by fatigue Patient left: in bed;with call bell/phone within reach;with bed alarm set Nurse Communication: Mobility status PT Visit Diagnosis: Difficulty in walking, not elsewhere classified (R26.2);Unsteadiness on feet (R26.81);Other abnormalities of gait and mobility (R26.89)     Time: 0017-4944 PT Time Calculation (min) (ACUTE ONLY): 23 min  Charges:  $Therapeutic Exercise: 8-22 mins $Therapeutic Activity: 8-22 mins                     Wyona Almas, PT, DPT Acute Rehabilitation Services Office [phone]    Deno Etienne 06/09/2022, 4:44 PM

## 2022-06-09 NOTE — Progress Notes (Signed)
Placed back on full support at this time due to pt desat into 80's and complaining she couldn't breathe

## 2022-06-09 NOTE — Progress Notes (Signed)
SLP Cancellation Note  Patient Details Name: Abigail Castillo MRN: [id number] DOB: Jun 17, 1950   Cancelled treatment:       Reason Eval/Treat Not Completed: Patient back on vent- will follow for PMV trials when on TC. Staff may use PMV as long as she has full supervision at this time.  Kyrstin Campillo L. Tivis Ringer, MA CCC/SLP Clinical Specialist - Acute Care SLP Acute Rehabilitation Services Office number [phone]   [name] 06/09/2022, 9:05 AM

## 2022-06-09 NOTE — TOC Progression Note (Signed)
Transition of Care [facility]) - Progression Note    Patient Details  Name: NOVEMBER SYPHER MRN: [id number] Date of Birth: 21-Nov-1950  Transition of Care [facility]) CM/SW Contact  Ella Bodo, RN Phone Number: 06/09/2022, 4:32 PM  Clinical Narrative:    LTAC appeal has been denied by insurance, and no other avenues available. TOC will start looking for vent SNF.     Expected Discharge Plan: Long Term Acute Care (LTAC) Barriers to Discharge: Continued Medical Work up  Expected Discharge Plan and Services     Post Acute Care Choice: Long Term Acute Care (LTAC)                                         Social Determinants of Health (SDOH) Interventions SDOH Screenings   Food Insecurity: No Food Insecurity (05/30/2022)  Housing: Low Risk  (05/30/2022)  Transportation Needs: No Transportation Needs (05/30/2022)  Utilities: Not At Risk (05/30/2022)  Alcohol Screen: Low Risk  (03/26/2021)  Depression (PHQ2-9): Low Risk  (03/26/2021)  Financial Resource Strain: Low Risk  (03/26/2021)  Physical Activity: Inactive (03/26/2021)  Social Connections: Moderately Isolated (03/26/2021)  Stress: No Stress Concern Present (03/26/2021)  Tobacco Use: Medium Risk (05/19/2022)    Readmission Risk Interventions     No data to display         Reinaldo Raddle, RN, BSN  Trauma/Neuro ICU Case Manager [phone]

## 2022-06-09 NOTE — Progress Notes (Signed)
NAME:  Abigail Castillo, MRN:  [id number], DOB:  November 27, 1950, LOS: 21 ADMISSION DATE:  05/19/2022, CONSULTATION DATE:  05/20/22 REFERRING MD:  Roger Shelter CHIEF COMPLAINT:  Dyspnea   History of Present Illness:  Abigail Castillo is a 72 y.o. female who has a PMH as below including but not limited to COPD on chronic 2-3L O2 and '10mg'$  Prednisone, OSA on nocturnal BiPAP, and who is followed by Dr. Lamonte Sakai in our office. She had recent admission 11/15 through 12/18 for AECOPD and was then discharged to [facility].  She was recently seen as an outpatient by Dr. Lamonte Sakai on 12/22 and was prescribed a slow prednisone taper with instructions to drop to '10mg'$  and continue until her next follow up appointment.  She presented back to [facility] ED 12/25 with dyspnea and hypoxia down to 65%. She was found to have hypoxic and hypercapnic respiratory failure and her RSV swab returned positive. She was placed on BiPAP and was admitted by [facility].  On 12/26, PCCM was consulted for assistance with BiPAP and COPD management.  Pertinent  Medical History:  has History of tobacco abuse; Depression; COPD (chronic obstructive pulmonary disease) ([place]); Osteoporosis; HOARSENESS, CHRONIC; HYPERTRIGLYCERIDEMIA; Memory change; B12 deficiency; Personal history of colonic polyps; Encounter for routine gynecological examination; Mild hyperlipidemia; Vitamin D deficiency; Esophageal stricture; GERD (gastroesophageal reflux disease); Schizoaffective disorder ([place]); Allergic rhinitis; Chronic respiratory failure ([place]); Acute on chronic respiratory failure with hypoxia ([place]); Elevated blood pressure reading without diagnosis of hypertension; Hepatic steatosis; Ductal carcinoma in situ (DCIS) of left breast; On home oxygen therapy; Hyponatremia; Bronchomalacia; Localized edema; COPD exacerbation ([place]); Tracheobronchomalacia; Acute on chronic respiratory failure with hypoxia and hypercapnia (HCC); COPD with acute exacerbation ([place]); RSV (respiratory syncytial  virus pneumonia); Essential hypertension; Malnutrition of moderate degree; Acute respiratory failure with hypercapnia ([place]); Pressure injury of skin; and Tracheostomy in place [facility]) on their problem list.  Significant Hospital Events: Including procedures, antibiotic start and stop dates in addition to other pertinent events   12/25 admit. 12/26 PCCM consult. 1/2 Failed BiPAP wean, Intubated 1/3 CT Head negative was completed for observed pupillary change 1/4 tracheostomy 1/8 CT head for anisocoria; neg,  1/11 continuing progressive trach collar trials 1/12 BID trach collar trials, appears comfortable  1/14 failed PSV  Interim History / Subjective:  Only lasted about 15 minutes on pressure support this morning  Objective:  Blood pressure 103/75, pulse 93, temperature 97.7 F (36.5 C), temperature source Axillary, resp. rate (Abnormal) 22, height '5\' 2"'$  (1.575 m), weight 63.8 kg, SpO2 97 %.    Vent Mode: PRVC FiO2 (%):  [30 %-40 %] 30 % Set Rate:  [18 bmp] 18 bmp Vt Set:  [400 mL] 400 mL PEEP:  [5 cmH20] 5 cmH20 Pressure Support:  [8 cmH20] 8 cmH20 Plateau Pressure:  [16 cmH20-21 cmH20] 17 cmH20   Intake/Output Summary (Last 24 hours) at 06/09/2022 0806 Last data filed at 06/09/2022 0600 Gross per 24 hour  Intake 1100 ml  Output 2000 ml  Net -900 ml   Filed Weights   06/03/22 0500 06/04/22 0325 06/07/22 0426  Weight: 63.4 kg 62.2 kg 63.8 kg   Physical Examination: General chronically ill trach and ventilator dependent currently in no acute distress HEENT normocephalic atraumatic size 6 tracheostomy is midline and unremarkable Pulmonary: Diffuse wheezing throughout currently on full ventilator support Cardiac: Regular rate and rhythm Abdomen: Soft nontender Extremities: Warm dry brisk cap refill Neuro: Awake, calm, no focal deficits noted.   Assessment & Plan:   Acute on  chronic respiratory failure with hypoxia/hypercarbia Severe COPD with RSV-induced flare now causing  inability to wean from vent, s/p tracheostomy 05/29/22 -given the severity of her disease and deconditioning anticipate long road to recovery  Plan Cont daily assessment for PSV and eventually ATC Suspect will need to approach in LTAC type strategy: initially 2-4 hr trach collar trials w/ equal rest in between trials w/ attempt to increase by 1 hr QOD Will pulse prednisone at 40 mg a day, she still has significant wheezing Cont torsemide continue brovana, yupelri and pulmicort PT/OT/SLP efforts  LTACH appeal underway  Chronic anxiety/depression Plan continue clonidine, buspar, zyprexa  klonopin QHS and BID PRN   GERD Plan Add H2B (she is on home Dexilant but we don't have PPI elixir currently)   Breast ca Plan arimidex  L cerebellar and L frontal stroke- age indeterminate; CTA head/neck with nothing actionable.  Tele w/o afib.  Cannot get MRI due to hardware. Plan PT/OT Cont asa   DM with hyperglycemia Plan Cont ssi goal 140-180 Semglee 10 units/d  Urinary retention- recurrent issue; UA neg Plan Bladder scan  Anisocoria- chronic  Plan Nothing to do   My cct 32 min    [name] ACNP-BC [place] Pager # [phone] OR # [phone] if no answer

## 2022-06-10 DIAGNOSIS — J441 Chronic obstructive pulmonary disease with (acute) exacerbation: Secondary | ICD-10-CM | POA: Diagnosis not present

## 2022-06-10 DIAGNOSIS — J9621 Acute and chronic respiratory failure with hypoxia: Secondary | ICD-10-CM | POA: Diagnosis not present

## 2022-06-10 DIAGNOSIS — J9622 Acute and chronic respiratory failure with hypercapnia: Secondary | ICD-10-CM | POA: Diagnosis not present

## 2022-06-10 LAB — GLUCOSE, CAPILLARY
Glucose-Capillary: 131 mg/dL — ABNORMAL HIGH (ref 70–99)
Glucose-Capillary: 140 mg/dL — ABNORMAL HIGH (ref 70–99)
Glucose-Capillary: 152 mg/dL — ABNORMAL HIGH (ref 70–99)
Glucose-Capillary: 200 mg/dL — ABNORMAL HIGH (ref 70–99)
Glucose-Capillary: 201 mg/dL — ABNORMAL HIGH (ref 70–99)
Glucose-Capillary: 213 mg/dL — ABNORMAL HIGH (ref 70–99)
Glucose-Capillary: 93 mg/dL (ref 70–99)

## 2022-06-10 LAB — CBC WITH DIFFERENTIAL/PLATELET
Abs Immature Granulocytes: 0.03 10*3/uL (ref 0.00–0.07)
Basophils Absolute: 0 10*3/uL (ref 0.0–0.1)
Basophils Relative: 0 %
Eosinophils Absolute: 0 10*3/uL (ref 0.0–0.5)
Eosinophils Relative: 0 %
HCT: 31.6 % — ABNORMAL LOW (ref 36.0–46.0)
Hemoglobin: 9.6 g/dL — ABNORMAL LOW (ref 12.0–15.0)
Immature Granulocytes: 1 %
Lymphocytes Relative: 10 %
Lymphs Abs: 0.7 10*3/uL (ref 0.7–4.0)
MCH: 31.3 pg (ref 26.0–34.0)
MCHC: 30.4 g/dL (ref 30.0–36.0)
MCV: 102.9 fL — ABNORMAL HIGH (ref 80.0–100.0)
Monocytes Absolute: 0.3 10*3/uL (ref 0.1–1.0)
Monocytes Relative: 5 %
Neutro Abs: 5.3 10*3/uL (ref 1.7–7.7)
Neutrophils Relative %: 84 %
Platelets: 219 10*3/uL (ref 150–400)
RBC: 3.07 MIL/uL — ABNORMAL LOW (ref 3.87–5.11)
RDW: 15.6 % — ABNORMAL HIGH (ref 11.5–15.5)
WBC: 6.3 10*3/uL (ref 4.0–10.5)
nRBC: 0 % (ref 0.0–0.2)

## 2022-06-10 LAB — BASIC METABOLIC PANEL
Anion gap: 10 (ref 5–15)
BUN: 24 mg/dL — ABNORMAL HIGH (ref 8–23)
CO2: 39 mmol/L — ABNORMAL HIGH (ref 22–32)
Calcium: 9.2 mg/dL (ref 8.9–10.3)
Chloride: 99 mmol/L (ref 98–111)
Creatinine, Ser: 0.42 mg/dL — ABNORMAL LOW (ref 0.44–1.00)
GFR, Estimated: >60 mL/min (ref >60)
Glucose, Bld: 132 mg/dL — ABNORMAL HIGH (ref 70–99)
Potassium: 3.9 mmol/L (ref 3.5–5.1)
Sodium: 148 mmol/L — ABNORMAL HIGH (ref 135–145)

## 2022-06-10 MED ORDER — FREE WATER
200.0000 mL | Status: DC
  Administered 2022-06-10 – 2022-06-18 (×34): 200 mL

## 2022-06-10 NOTE — Progress Notes (Signed)
Nutrition Follow-up  DOCUMENTATION CODES:   Non-severe (moderate) malnutrition in context of acute illness/injury  INTERVENTION:   Agree with PEG tube placement as pt denied LTAC and cannot discharge to SNF with Cortrak in place. Pt not likely close to diet advancement with adequate nutrition. Plan to trial bolus feedings once PEG placed  Tube Feeding via Cortrak:  Osmolite 1.5 at 50 ml/h (1200 ml per day) Pro-Source TF20 60 ml 1x/d Provides 1880 kcal, 95 gm protein, 914 ml free water daily   Free water flush of 200 mL q 4 hours plus TF provides total free water of: 2114 mL/day   Continue MVI daily  Continue Banatrol TF BID   NUTRITION DIAGNOSIS:   Moderate Malnutrition related to acute illness (COPD exacerbation) as evidenced by mild fat depletion, mild muscle depletion, percent weight loss.  Being addressed via TF   GOAL:   Patient will meet greater than or equal to 90% of their needs  Progressing  MONITOR:   PO intake, Supplement acceptance, Labs, Weight trends  REASON FOR ASSESSMENT:   Consult Assessment of nutrition requirement/status  ASSESSMENT:   Pt admitted from [facility] SNF with SOB r/t COPD exacerbation. PMH significant for breast cancer (2022), COPD on 2-3L home O2, depression/anxiety. Recently admitted 11/15-12/18 for COPD exacerbation.  12/25 Admitted 01/02 Intubated 01/03 Cortrak placed 01/04 Trach placed  Pt remains weak, on vent support via trach; TC trials as able but only tolerated 1 hour of TC today Denied LTACH appeal, now plan for vent SNF.  Tolerating Osmolite 1.5 TF at 50 ml/hr via Cortrak. Pt cannot discharge to SNF with Cortrak in place; noted plan for PEG  Hypernatremic, sodium has been trending up, currently 148. Noted free water flush of 200 mL q 4 hours re-ordered today by MD. Pt previously on free water flush of 200 mL q 4 hours but appears to have been discontinued at some time over the weekend  Type 6 stool, rectal pouch,  Banatrol TF started BID on 1/14  Weight remains relatively stable No skin breakdown (MASD or PI) per RN skin assessment  Labs: sodium 148 (H), CBGs 93-237, Creatinine 0.42 Meds: ss novolog, semglee, MVI with Minerals, prednisone, torsemide   Diet Order:   Diet Order             Diet NPO time specified  Diet effective now                   EDUCATION NEEDS:   Not appropriate for education at this time  Skin:  Skin Assessment: Reviewed RN Assessment  Last BM:  1/16 type 6, rectal pouch in place  Height:   Ht Readings from Last 1 Encounters:  05/21/22 '5\' 2"'$  (1.575 m)    Weight:   Wt Readings from Last 1 Encounters:  06/10/22 61.1 kg    BMI:  Body mass index is 24.64 kg/m.  Estimated Nutritional Needs:   Kcal:  1700-1900 kcal/d  Protein:  90-105  Fluid:  1.8-2 L/d  Kerman Passey MS, RDN, LDN, CNSC Registered Dietitian 3 Clinical Nutrition RD Pager and On-Call Pager Number Located in [place]

## 2022-06-10 NOTE — TOC Initial Note (Addendum)
Transition of Care [facility]) - Initial/Assessment Note    Patient Details  Name: Abigail Castillo MRN: [id number] Date of Birth: [birth year]  [facility]) CM/SW Contact:    Milas Gain, [place] Phone Number: 06/10/2022, 1:06 PM  Clinical Narrative:                  CSW received consult for possible Vent/SNF placement for patient at time of discharge. Patient intubated/trached CSW spoke with patients daughter Candice regarding Vent/SNF placement at time of discharge. Candice reports PTA patient came from [place] short term.Patients daughter  expressed understanding of Vent/SNF placement at time of discharge and is agreeable for CSW to fax patient out for possible vent/SNF placement. Patients daughter reports preference for [facility] in [place] . Patients daughter gave CSW permission to fax out to [place], [place] for possible Vent/SNF placement for patient. CSW following to fax out when appropriate. Patient has cortrak.CSW discussed insurance  process and will provide vent/snf bed offers when available.  No further questions reported at this time. CSW to continue to follow and assist with discharge planning needs.    Expected Discharge Plan: [place] (Vent/SNF) Barriers to Discharge: Continued Medical Work up   Patient Goals and CMS Choice   CMS Medicare.gov Compare Post Acute Care list provided to:: Patient Represenative (must comment) (Patients daughter Candice) Choice offered to / list presented to : Adult Children (Patients daughter Event organiser)      Expected Discharge Plan and Services In-house Referral: Clinical Social Work   Post Acute Care Choice: Long Term Acute Care (LTAC) Living arrangements for the past 2 months:  (PTA came from [place] short term before that was from home alone)                                      Prior Living Arrangements/Services Living arrangements for the past 2 months:  (PTA came from  [place] short term before that was from home alone)   Patient language and need for interpreter reviewed:: Yes Do you feel safe going back to the place where you live?: No   Vent/SNF  Need for Family Participation in Patient Care: Yes (Comment) Care giver support system in place?: Yes (comment)   Criminal Activity/Legal Involvement Pertinent to Current Situation/Hospitalization: No - Comment as needed  Activities of Daily Living Home Assistive Devices/Equipment: Eyeglasses, Oxygen ADL Screening (condition at time of admission) Patient's cognitive ability adequate to safely complete daily activities?: Yes Is the patient deaf or have difficulty hearing?: No Does the patient have difficulty seeing, even when wearing glasses/contacts?: No Does the patient have difficulty concentrating, remembering, or making decisions?: No Patient able to express need for assistance with ADLs?: Yes Does the patient have difficulty dressing or bathing?: No Independently performs ADLs?: Yes (appropriate for developmental age) Does the patient have difficulty walking or climbing stairs?: No Weakness of Legs: None Weakness of Arms/Hands: None  Permission Sought/Granted Permission sought to share information with : Case Manager, Family Supports, Chartered certified accountant granted to share information with : No  Share Information with NAME: Due to patients orientation CSW spoke with [employer]  Permission granted to share info w AGENCY: Vent/SNF  Permission granted to share info w Relationship: daughter  Permission granted to share info w Contact Information: Due to patients orientation CSW spoke with Candice,Candice [phone]  Emotional Assessment       Orientation: :  (  intubated/trach) Alcohol / Substance Use: Not Applicable Psych Involvement: No (comment)  Admission diagnosis:  COPD exacerbation ([place]) [J44.1] Acute respiratory failure with hypercapnia ([place]) [J96.02] COPD with acute  exacerbation ([place]) [J44.1] Patient Active Problem List   Diagnosis Date Noted   Tracheostomy in place ([place]) 06/08/2022   Acute respiratory failure with hypercapnia ([place]) 05/28/2022   Pressure injury of skin 05/28/2022   Malnutrition of moderate degree 05/20/2022   COPD with acute exacerbation ([place]) 05/19/2022   RSV (respiratory syncytial virus pneumonia) 05/19/2022   Essential hypertension 05/19/2022   Tracheobronchomalacia 04/27/2022   Acute on chronic respiratory failure with hypoxia and hypercapnia (HCC) 04/27/2022   Bronchomalacia 04/25/2022   Localized edema 04/25/2022   COPD exacerbation ([place]) 04/25/2022   Hyponatremia 04/10/2022   On home oxygen therapy 12/24/2021   Ductal carcinoma in situ (DCIS) of left breast 06/03/2021   Hepatic steatosis 05/13/2021   Elevated blood pressure reading without diagnosis of hypertension 09/14/2020   Acute on chronic respiratory failure with hypoxia ([place]) 07/05/2019   Chronic respiratory failure ([place]) 08/25/2017   Allergic rhinitis 06/16/2017   Schizoaffective disorder ([place]) 06/30/2016   GERD (gastroesophageal reflux disease) 11/02/2015   Esophageal stricture 03/14/2015   Mild hyperlipidemia 02/03/2014   Vitamin D deficiency 02/03/2014   Encounter for routine gynecological examination 06/14/2013   Personal history of colonic polyps 03/10/2012   B12 deficiency 11/11/2010   Memory change 11/04/2010   HYPERTRIGLYCERIDEMIA 06/14/2010   Depression 05/31/2009   History of tobacco abuse 03/02/2008   Osteoporosis 04/05/2007   COPD (chronic obstructive pulmonary disease) ([place]) 03/24/2007   HOARSENESS, CHRONIC 03/24/2007   PCP:  [name], NP Pharmacy:   [facility], [place] [address] Phone: [phone] Fax: [phone]  [id number] - [place], [address] AT [address] [place] [address] Phone: [phone] Fax: [phone]  [id number] [place] [address] [place]Phone: [phone]: [phone] [facility] [address]Phone: [phone]: [phone]    Social Determinants of Health (SDOH) Social History: [place] No Food Insecurity (05/30/2022)  Housing: Low Risk  (05/30/2022)  Transportation Needs: No Transportation Needs (05/30/2022)  Utilities: Not At Risk (05/30/2022)  Alcohol Screen: Low Risk  (03/26/2021)  Depression (PHQ2-9): Low Risk  (03/26/2021)  Financial Resource Strain: Low Risk  (03/26/2021)  Physical Activity: Inactive (03/26/2021)  Social Connections: Moderately Isolated (03/26/2021)  Stress: No Stress Concern Present (03/26/2021)  Tobacco Use: Medium Risk (05/19/2022)   SDOH Interventions:     Readmission Risk Interventions     No data to display

## 2022-06-10 NOTE — Progress Notes (Signed)
Speech Language Pathology Treatment: Abigail Castillo Speaking valve  Patient Details Name: Abigail Castillo MRN: [id number] DOB: 1950-10-29 Today's Date: 06/10/2022 Time: 9166-0600 SLP Time Calculation (min) (ACUTE ONLY): 14 min  Assessment / Plan / Recommendation Clinical Impression  Pt was on TC and just suctioned by RN before SLP deflated cuff. PMV was placed with no attempts to phonate as her SpO2 rapidly dropped to 77% and the PMV was removed. RN increased supplemental O2. Her SpO2 increased to 89% and the PMV was placed again. Pt was able to briefly phonate with low vocal quality and hoarseness noted. Pt tolerated PMV for less than 60 seconds and was removed due to a decrease in SpO2 and visible grimacing. Pt reports not feeling well this morning. Question impact of pt's overall fatigue this morning. Recommend using PMV with SLP only due to decreased respiratory status as she weans off the vent.    HPI HPI: Abigail Castillo is a 72 y.o. F recently admitted for COPD flare, discharged to SNF who returned 12/25 due to SpO2 65%. Found to be RSV+, pCO2 80, started on BiPAP. Head CT showing possible subacute infarct of the medial left cerebellar hemisphere. Initial bedside swallow eval 1/1 WFL. Failed bipap wean on 1/2, intubated. Tracheostomy 1/4. Pt with COPD and chronic respiratory failure on 2-3L home O2, depression/anxiety and hx BrCA      SLP Plan  Continue with current plan of care      Recommendations for follow up therapy are one component of a multi-disciplinary discharge planning process, led by the attending physician.  Recommendations may be updated based on patient status, additional functional criteria and insurance authorization.    Recommendations         Patient may use Passy-Muir Speech Valve: with SLP only         Oral Care Recommendations: Oral care QID Follow Up Recommendations: SLP at Long-term acute care hospital Assistance recommended at discharge: Frequent or constant  Supervision/Assistance SLP Visit Diagnosis: Aphonia (R49.1) Plan: Continue with current plan of care          Abigail Castillo, Student SLP  06/10/2022, 10:10 AM

## 2022-06-10 NOTE — Progress Notes (Signed)
NAME:  ZARIE KOSIBA, MRN:  [id number], DOB:  September 18, 1950, LOS: 92 ADMISSION DATE:  05/19/2022, CONSULTATION DATE:  05/20/22 REFERRING MD:  Roger Shelter CHIEF COMPLAINT:  Dyspnea   History of Present Illness:  LIVIE VANDERHOOF is a 72 y.o. female who has a PMH as below including but not limited to COPD on chronic 2-3L O2 and '10mg'$  Prednisone, OSA on nocturnal BiPAP, and who is followed by Dr. Lamonte Sakai in our office. She had recent admission 11/15 through 12/18 for AECOPD and was then discharged to [facility].  She was recently seen as an outpatient by Dr. Lamonte Sakai on 12/22 and was prescribed a slow prednisone taper with instructions to drop to '10mg'$  and continue until her next follow up appointment.  She presented back to [facility] ED 12/25 with dyspnea and hypoxia down to 65%. She was found to have hypoxic and hypercapnic respiratory failure and her RSV swab returned positive. She was placed on BiPAP and was admitted by [facility].  On 12/26, PCCM was consulted for assistance with BiPAP and COPD management.  Pertinent  Medical History:  has History of tobacco abuse; Depression; COPD (chronic obstructive pulmonary disease) ([place]); Osteoporosis; HOARSENESS, CHRONIC; HYPERTRIGLYCERIDEMIA; Memory change; B12 deficiency; Personal history of colonic polyps; Encounter for routine gynecological examination; Mild hyperlipidemia; Vitamin D deficiency; Esophageal stricture; GERD (gastroesophageal reflux disease); Schizoaffective disorder ([place]); Allergic rhinitis; Chronic respiratory failure ([place]); Acute on chronic respiratory failure with hypoxia ([place]); Elevated blood pressure reading without diagnosis of hypertension; Hepatic steatosis; Ductal carcinoma in situ (DCIS) of left breast; On home oxygen therapy; Hyponatremia; Bronchomalacia; Localized edema; COPD exacerbation ([place]); Tracheobronchomalacia; Acute on chronic respiratory failure with hypoxia and hypercapnia (HCC); COPD with acute exacerbation ([place]); RSV (respiratory syncytial  virus pneumonia); Essential hypertension; Malnutrition of moderate degree; Acute respiratory failure with hypercapnia ([place]); Pressure injury of skin; and Tracheostomy in place [facility]) on their problem list.  Significant Hospital Events: Including procedures, antibiotic start and stop dates in addition to other pertinent events   12/25 admit. 12/26 PCCM consult. 1/2 Failed BiPAP wean, Intubated 1/3 CT Head negative was completed for observed pupillary change 1/4 tracheostomy 1/8 CT head for anisocoria; neg,  1/11 continuing progressive trach collar trials 1/12 BID trach collar trials, appears comfortable  1/14 failed PSV 1/15 pulse prednisone at 40 mg continues to fail SBT trials LTAC denied awaiting vent SNF  Interim History / Subjective:  Still weak.  Objective:  Blood pressure (Abnormal) 155/87, pulse (Abnormal) 106, temperature 98.3 F (36.8 C), temperature source Oral, resp. rate (Abnormal) 25, height '5\' 2"'$  (1.575 m), weight 61.1 kg, SpO2 100 %.    Vent Mode: PSV;CPAP FiO2 (%):  [40 %] 40 % Set Rate:  [18 bmp] 18 bmp Vt Set:  [400 mL] 400 mL PEEP:  [5 cmH20] 5 cmH20 Pressure Support:  [8 cmH20] 8 cmH20 Plateau Pressure:  [15 cmH20-17 cmH20] 15 cmH20   Intake/Output Summary (Last 24 hours) at 06/10/2022 0843 Last data filed at 06/10/2022 0800 Gross per 24 hour  Intake 1440 ml  Output 1250 ml  Net 190 ml   Filed Weights   06/04/22 0325 06/07/22 0426 06/10/22 0500  Weight: 62.2 kg 63.8 kg 61.1 kg   Physical Examination: General: Chronically ill-appearing 72 year old female remains on full ventilator support. HEENT normocephalic atraumatic no jugular venous distention appreciated tracheostomy is midline without discharge or drainage Pulmonary: Prolonged expiratory phase, slightly improved air movement compared with exam on 1/15 Cardiac: Regular rate and rhythm without murmur rub or gallop Abdomen: Soft nontender no  organomegaly Extremities: Warm dry brisk cap refill Neuro:  Awake, appropriate, moves all extremities, no focal deficits appreciated.   Assessment & Plan:   Acute on chronic respiratory failure with hypoxia/hypercarbia Severe COPD with RSV-induced flare now causing inability to wean from vent, s/p tracheostomy 05/29/22 -given the severity of her disease and deconditioning anticipate long road to recovery  Plan Continue daily assessment for pressure support ventilation and aerosol trach collar Suspect will need to approach in LTAC type strategy: initially 2-4 hr trach collar trials w/ equal rest in between trials w/ attempt to increase by 1 hr QOD Started prednisone pulse 40 mg on 1/15, will decrease by 10 mg every 4 days, I am wondering if she will need to hold at a higher maintenance dose of prednisone Cont torsemide as long as BUN and creatinine tolerate continue brovana, yupelri and pulmicort PT/OT/SLP efforts  LTACH appeal underway  Moderate hypernatremia. Water deficit 1.57 L Plan Increase free water to every 4 hours Repeat chemistry in the morning  Chronic anxiety/depression Seems well-controlled with current regimen  plan continue clonidine, buspar, zyprexa  klonopin QHS and BID PRN   GERD Plan Continue Pepcid (she is on home Dexilant but we don't have PPI elixir currently)   Breast ca Plan arimidex  L cerebellar and L frontal stroke- age indeterminate; CTA head/neck with nothing actionable.  Tele w/o afib.  Cannot get MRI due to hardware. Plan PT/OT Cont asa   DM with hyperglycemia Plan Cont ssi goal 140-180 Semglee 10 units/d  Urinary retention- recurrent issue; UA neg Plan Bladder scan  Anisocoria- chronic  Plan Nothing to do   My cct 31 min    [name] ACNP-BC [place] Pager # [phone] OR # [phone] if no answer

## 2022-06-10 NOTE — Consult Note (Signed)
Chief Complaint: Patient was seen in consultation today for percutaneous gastric tube placement Chief Complaint  Patient presents with   Respiratory Distress    BIB EMS from [facility] for resp distress, pt is on CPAP with EMS, on 4L satting 65%, tripodding, no air movement in lungs, A&O x4 at baseline, presenting with extreme fatigue, hx of COPD and asthma   at the request of Dr Jeanella Craze   Supervising Physician: Ruthann Cancer  Patient Status: [facility] - In-pt  History of Present Illness: Abigail Castillo is a 72 y.o. female   Acute on chronic hypoxia; resp failure due to COPD/ RSV PNA Vent/trach For likely LTACH soon Dysphagia Deconditioning  Request made for Percutaneous gastric tube placement On ASA 81 mg daily Plan for G tube placement in IR 1/22 Monday after off ASA 5 days Note into P CCM   Past Medical History:  Diagnosis Date   Anxiety    Blood transfusion    Blood transfusion without reported diagnosis    Breast cancer ([place]) 05/13/2021   Chronic mental illness    COPD (chronic obstructive pulmonary disease) ([place])    DEPRESSION 05/31/2009   Esophageal stricture 03/14/2015   GERD (gastroesophageal reflux disease)    Hepatic steatosis    Osteoporosis    Oxygen dependent    on O2 at 2-3L/min 24hr/day   Tobacco abuse    ongoing   Vitamin B 12 deficiency    Vitamin D deficiency     Past Surgical History:  Procedure Laterality Date   ABDOMINAL HYSTERECTOMY     BREAST EXCISIONAL BIOPSY Left 2017   BREAST LUMPECTOMY  1972   left breast   BREAST LUMPECTOMY WITH RADIOACTIVE SEED LOCALIZATION Left 11/22/2015   Procedure: LEFT BREAST LUMPECTOMY WITH RADIOACTIVE SEED LOCALIZATION;  Surgeon: Erroll Luna, MD;  Location: [facility];  Service: General;  Laterality: Left;   BREAST LUMPECTOMY WITH RADIOACTIVE SEED LOCALIZATION Left 07/03/2021   Procedure: LEFT BREAST LUMPECTOMY WITH RADIOACTIVE SEED LOCALIZATION;  Surgeon: Erroll Luna, MD;  Location: [facility];  Service:  General;  Laterality: Left;   BREAST SURGERY  ?2015   lumpectomy, left   CERVICAL CONE BIOPSY     CHOLECYSTECTOMY     COLONOSCOPY     ESOPHAGOGASTRODUODENOSCOPY (EGD) WITH ESOPHAGEAL DILATION     UPPER GASTROINTESTINAL ENDOSCOPY      Allergies: Sulfonamide derivatives  Medications: Prior to Admission medications   Medication Sig Start Date End Date Taking? Authorizing Provider  albuterol (PROVENTIL) (2.5 MG/3ML) 0.083% nebulizer solution Take 3 mLs (2.5 mg total) by nebulization every 2 (two) hours as needed for wheezing or shortness of breath. 05/12/22  Yes Hongalgi, Lenis Dickinson, MD  albuterol (VENTOLIN HFA) 108 (90 Base) MCG/ACT inhaler INHALE TWO PUFFS BY MOUTH INTO THE LUNGS EVERY 6 HOURS AS NEEDED FOR WHEEZING OR SHORTNESS OF BREATH Patient taking differently: Inhale 2 puffs into the lungs every 6 (six) hours as needed for wheezing or shortness of breath. 11/29/21  Yes Collene Gobble, MD  amLODipine (NORVASC) 2.5 MG tablet Take 1 tablet (2.5 mg total) by mouth daily. Patient taking differently: Take 2.5 mg by mouth in the morning. (0900) 04/25/22 05/25/22 Yes Kc, Maren Beach, MD  anastrozole (ARIMIDEX) 1 MG tablet TAKE ONE TABLET BY MOUTH DAILY Patient taking differently: Take 1 mg by mouth in the morning. (0900) 02/14/22  Yes Iruku, Arletha Pili, MD  arformoterol (BROVANA) 15 MCG/2ML NEBU Take 2 mLs (15 mcg total) by nebulization 2 (two) times daily. Patient taking differently: Take 15 mcg  by nebulization 2 (two) times daily. (0900, 2100) 05/06/22  Yes Samuella Cota, MD  azithromycin (ZITHROMAX) 500 MG tablet Take 1 tablet (500 mg total) by mouth 3 (three) times a week. Patient taking differently: Take 500 mg by mouth every Monday, Wednesday, and Friday. 05/07/22  Yes Samuella Cota, MD  bisacodyl (DULCOLAX) 10 MG suppository Place 10 mg rectally daily as needed (constipation not relieved by Milk of Magnesia).   Yes [provider]  Calcium Carbonate-Vitamin D 600-400 MG-UNIT  tablet Take 1 tablet by mouth 2 (two) times daily. Patient taking differently: Take 1 tablet by mouth 2 (two) times daily. (0900, 2100) 02/22/21  Yes [name], NP  cholecalciferol (VITAMIN D3) 25 MCG (1000 UNIT) tablet Take 3,000 Units by mouth in the morning. (0900)   Yes [provider]  cyanocobalamin (VITAMIN B12) 1000 MCG tablet TAKE ONE TABLET BY MOUTH DAILY Patient taking differently: Take 1,000 mcg by mouth in the morning. (0900) 04/28/22  Yes [name], NP  Eyelid Cleansers (AVENOVA EX) Place 3 sprays into both eyes 2 (two) times daily. (0900, 1700)   Yes [provider]  furosemide (LASIX) 20 MG tablet Take 1 tablet (20 mg total) by mouth daily. Patient taking differently: Take 20 mg by mouth in the morning. (0900) 05/05/22  Yes Samuella Cota, MD  ipratropium-albuterol (DUONEB) 0.5-2.5 (3) MG/3ML SOLN Take 3 mLs by nebulization in the morning, at noon, in the evening, and at bedtime. 05/05/22  Yes Samuella Cota, MD  loratadine (CLARITIN) 10 MG tablet Take 1 tablet (10 mg total) by mouth daily. Patient taking differently: Take 10 mg by mouth in the morning. (0900) 05/06/22  Yes Samuella Cota, MD  LORazepam (ATIVAN) 1 MG tablet Take 1 mg by mouth 2 (two) times daily as needed for anxiety.   Yes [provider]  Magnesium Hydroxide (MILK OF MAGNESIA PO) Take 30 mLs by mouth daily as needed (no BM in 3 days).   Yes [provider]  OLANZapine (ZYPREXA) 7.5 MG tablet Take 7.5 mg by mouth in the morning. (0900)   Yes [provider]  OXYGEN Inhale 3 L into the lungs continuous.   Yes [provider]  predniSONE (DELTASONE) 10 MG tablet Take 3 tabs (30 mg total) daily x 1 week, then 2 tabs (20 mg total) daily x 1 week, then continue 1 tab (10 Mg total) daily until office visit with pulmonology. Patient taking differently: Take 10-30 mg by mouth in the morning. 30 mg once daily for 7 days, 20 mg once daily for  7 days, 10 mg once daily until office visit with pulmonology 05/12/22  Yes Hongalgi, Lenis Dickinson, MD  revefenacin (YUPELRI) 175 MCG/3ML nebulizer solution Take 3 mLs (175 mcg total) by nebulization daily. Patient taking differently: Take 175 mcg by nebulization in the morning. (0900) 05/06/22  Yes Samuella Cota, MD  Sodium Phosphates (ENEMA DISPOSABLE RE) Place 1 enema rectally daily as needed (constipation not relieved by bisacodyl).   Yes [provider]  vitamin E 180 MG (400 UNITS) capsule Take 400 Units by mouth in the morning. (0900)   Yes [provider]  dexlansoprazole (DEXILANT) 60 MG capsule TAKE ONE CAPSULE BY MOUTH DAILY 05/30/22   [name], NP  Spacer/Aero-Holding Chambers (AEROCHAMBER MV) inhaler Use as instructed 03/13/17   Collene Gobble, MD     Family History  Problem Relation Age of Onset   Coronary artery disease Mother    Diabetes Mother  Hypertension Mother    Coronary artery disease Father    Diabetes Father        Grandfather   Hypertension Father    Stroke Father    Other Father        colostomy bag   Breast cancer Sister    Breast cancer Sister    Diabetes Sister    Breast cancer Sister    Hypertension Sister    Mental illness Sister        x 4   Cancer Other        niece--breast   Esophageal cancer Neg Hx    Stomach cancer Neg Hx    Rectal cancer Neg Hx    Colon cancer Neg Hx    Colon polyps Neg Hx     Social History   Socioeconomic History   Marital status: Divorced    Spouse name: Not on file   Number of children: 3   Years of education: Not on file   Highest education level: Not on file  Occupational History   Occupation: disabled    Employer: UNEMPLOYED  Tobacco Use   Smoking status: Former    Packs/day: 1.00    Years: 40.00    Total pack years: 40.00    Types: Cigarettes    Quit date: 08/12/2011    Years since quitting: 10.8    Passive exposure: Past   Smokeless tobacco: Never  Vaping Use    Vaping Use: Never used  Substance and Sexual Activity   Alcohol use: No    Alcohol/week: 0.0 standard drinks of alcohol   Drug use: No   Sexual activity: Never  Other Topics Concern   Not on file  Social History Narrative   Lost a son to Lung CA 1/18   Lives with a son in [place]   Has a daughter who lives locally   Social Determinants of Health   Financial Resource Strain: Low Risk  (03/26/2021)   Overall Financial Resource Strain (CARDIA)    Difficulty of Paying Living Expenses: Not hard at all  Food Insecurity: No Food Insecurity (05/30/2022)   Hunger Vital Sign    Worried About Running Out of Food in the Last Year: Never true    Ran Out of Food in the Last Year: Never true  Transportation Needs: No Transportation Needs (05/30/2022)   PRAPARE - Hydrologist (Medical): No    Lack of Transportation (Non-Medical): No  Physical Activity: Inactive (03/26/2021)   Exercise Vital Sign    Days of Exercise per Week: 0 days    Minutes of Exercise per Session: 0 min  Stress: No Stress Concern Present (03/26/2021)   [place]    Feeling of Stress : Not at all  Social Connections: Moderately Isolated (03/26/2021)   Social Connection and Isolation Panel [NHANES]    Frequency of Communication with Friends and Family: More than three times a week    Frequency of Social Gatherings with Friends and Family: More than three times a week    Attends Religious Services: More than 4 times per year    Active Member of [employer] or Organizations: No    Attends Archivist Meetings: Never    Marital Status: Divorced    Review of Systems: A 12 point ROS discussed and pertinent positives are indicated in the HPI above.  All other systems are negative.    Vital Signs: BP (!) 163/85  Pulse (!) 122   Temp 97.9 F (36.6 C) (Oral)   Resp (!) 25   Ht '5\' 2"'$  (1.575 m)   Wt 134 lb 11.2 oz (61.1 kg)    SpO2 98%   BMI 24.64 kg/m    Physical Exam Vitals reviewed.  HENT:     Mouth/Throat:     Mouth: Mucous membranes are moist.  Cardiovascular:     Rate and Rhythm: Tachycardia present.  Pulmonary:     Comments: vent Abdominal:     Palpations: Abdomen is soft.  Skin:    General: Skin is warm.  Neurological:     Mental Status: She is alert.     Comments: Spoke to [employer] via phone She consented to procedure     Imaging: DG CHEST PORT 1 VIEW  Result Date: 06/08/2022 CLINICAL DATA:  Hypoxia EXAM: PORTABLE CHEST 1 VIEW COMPARISON:  May 31, 2022 FINDINGS: Emphysematous changes in the lungs, particularly the right upper lobe. Small bilateral effusions versus pleural thickening. Bibasilar atelectasis. No other interval changes or acute abnormalities. IMPRESSION: 1. Emphysematous changes in the lungs. 2. Small bilateral pleural effusions versus pleural thickening. 3. Bibasilar opacities favored represent atelectasis. Kerley B lines in the left greater than right lung bases favored represent mild edema. Electronically Signed   By: Dorise Bullion III M.D.   On: 06/08/2022 15:34   CT HEAD WO CONTRAST (5MM)  Result Date: 06/02/2022 CLINICAL DATA:  Anisocoria and altered sensorium. EXAM: CT HEAD WITHOUT CONTRAST TECHNIQUE: Contiguous axial images were obtained from the base of the skull through the vertex without intravenous contrast. RADIATION DOSE REDUCTION: This exam was performed according to the departmental dose-optimization program which includes automated exposure control, adjustment of the mA and/or kV according to patient size and/or use of iterative reconstruction technique. COMPARISON:  Recent head CT 05/28/2022. FINDINGS: Brain: There is symmetric hypodensity in central cerebellar white matter, is similar to 05/28/2022 and could indicate chronic versus subacute ischemic changes. There is no positive mass effect seemingly associated with it. There is moderately advanced cerebral  atrophy and atrophic ventriculomegaly, and a chronic infarct of the lower lateral left frontal lobe. There is moderate to severe small vessel disease of the cerebral white matter. There are tiny chronic lacunar infarcts in both thalami. No acute cortical based infarct is seen and no hemorrhage, mass effect or midline shift is evident. Basal cisterns are clear. Vascular: There is moderate calcification of the carotid siphons. No hyperdense central vessel. Skull: There is osteopenia without evidence of fractures or focal lesions. Sinuses/Orbits: There is a nasoenteric intubation entering from the left, with a feeding tube. There is minimal fluid in the maxillary and bilateral sphenoid sinuses which could be related to the nasoenteric intubation or due to sinusitis. There is mild membrane thickening in the ethmoids. There is scattered increased fluid in the left mastoid air cells. The right mastoids and both middle ears are clear. Other: None. IMPRESSION: 1. Symmetric hypodensity in the central cerebellar white matter, similar to 05/28/2022. This could indicate chronic versus subacute ischemic changes. MRI follow-up may be helpful. 2. Atrophy, chronic left frontal infarct, and moderate to severe small vessel disease. 3. No acute intracranial CT findings or interval changes from the recent study. 4. Sinus and left mastoid disease with increased fluid in the maxillary and sphenoid sinuses and left mastoids, possibly related to nasoenteric intubation or could be due to sinusitis/mastoiditis. Electronically Signed   By: Telford Nab M.D.   On: 06/02/2022 21:45   CT  Angio Chest Pulmonary Embolism (PE) W or WO Contrast  Result Date: 05/31/2022 CLINICAL DATA:  High clinical suspicion for pulmonary embolism EXAM: CT ANGIOGRAPHY CHEST WITH CONTRAST TECHNIQUE: Multidetector CT imaging of the chest was performed using the standard protocol during bolus administration of intravenous contrast. Multiplanar CT image  reconstructions and MIPs were obtained to evaluate the vascular anatomy. RADIATION DOSE REDUCTION: This exam was performed according to the departmental dose-optimization program which includes automated exposure control, adjustment of the mA and/or kV according to patient size and/or use of iterative reconstruction technique. CONTRAST:  60m OMNIPAQUE IOHEXOL 350 MG/ML SOLN COMPARISON:  Previous CT done on 04/14/2022, chest radiograph done earlier today FINDINGS: Cardiovascular: There is homogeneous enhancement in thoracic aorta. There are scattered calcifications in thoracic aorta. There are no intraluminal filling defects in pulmonary artery branches. Few coronary artery calcifications are seen. Small pericardial effusion is seen. Mediastinum/Nodes: Tip of tracheostomy is slightly above the level of the aortic arch. There is narrowing of AP diameter of lower trachea and main bronchi which may be due to tracheobronchomalacia or spasm. Enteric tube is noted traversing the esophagus. Lungs/Pleura: Bullous emphysema is seen in both lungs, more so in the upper lung fields. There is slight prominence of interstitial markings in the mid and lower lung fields. There is no focal pulmonary consolidation. Faint ground-glass densities are seen in mid and lower lung fields which appears slightly more prominent. There is linear nodular density in anterior right lower lung field which appears less prominent. There is faint 4 mm nodular density in right lower lobe which has not changed. Upper Abdomen: Tip of enteric tube is seen in the antrum of the stomach. Surgical clips are seen in gallbladder fossa. Musculoskeletal: No acute findings are seen. Review of the MIP images confirms the above findings. IMPRESSION: There is no evidence of pulmonary artery embolism. There is no evidence of thoracic aortic dissection. Aortic atherosclerosis. Small pericardial effusion is seen. COPD with bullous emphysema in the upper lung fields.  There is subtle increase in interstitial markings in mid and lower lung fields which may suggest scarring or mild superimposed interstitial pneumonia. There is no focal pulmonary consolidation. There is no pleural effusion. Other findings as described in the body of the report. Electronically Signed   By: PElmer PickerM.D.   On: 05/31/2022 14:54   DG CHEST PORT 1 VIEW  Result Date: 05/31/2022 CLINICAL DATA:  Dyspnea EXAM: PORTABLE CHEST 1 VIEW COMPARISON:  05/30/2022 and 07/05/2019. FINDINGS: Hyperinflated lungs consistent with bullous emphysematous. 2.5 cm indeterminate nodular structure overlying the upper medial left hemithorax. Bibasilar pulmonary interstitial changes suggesting pulmonary edema, scarring or interstitial lung disease. Trace pleural effusions. No pneumothorax. Tracheostomy tip just below thoracic inlet. Feeding tube tip below the diaphragm and off x-ray. IMPRESSION: 1. Hyperinflated lungs with bullous changes of COPD. 2. Indeterminate nodular lesion upper medial left hemithorax, 2.5 cm. 3. Bibasilar pulmonary interstitial changes consistent with pulmonary edema, interstitial lung disease or scarring. 4. Minimal pleural effusions. Electronically Signed   By: JSammie BenchM.D.   On: 05/31/2022 14:04   DG Chest Port 1 View  Result Date: 05/30/2022 CLINICAL DATA:  Respiratory failure EXAM: PORTABLE CHEST 1 VIEW COMPARISON:  May 29, 2022 FINDINGS: The feeding tube terminates below today's film. The tracheostomy tube is stable. No pneumothorax. Emphysematous changes in the lungs. Decreasing bibasilar opacities. No other interval changes. IMPRESSION: 1. Stable support apparatus. 2. Emphysema. 3. Decreased bibasilar opacities consistent with improving atelectasis. Electronically Signed   By: DShanon Brow  Jimmye Norman III M.D.   On: 05/30/2022 08:15   DG Chest Port 1 View  Result Date: 05/29/2022 CLINICAL DATA:  Status post tracheostomy. EXAM: PORTABLE CHEST 1 VIEW COMPARISON:  May 27, 2022. FINDINGS: The heart size and mediastinal contours are within normal limits. Tracheostomy and feeding tubes are in grossly good position. Emphysematous change is noted bilaterally with probable bibasilar scarring or subsegmental atelectasis. The visualized skeletal structures are unremarkable. IMPRESSION: Diffuse emphysematous changes noted with bibasilar subsegmental atelectasis or scarring. Tracheostomy and feeding tubes are in grossly good position. Emphysema (ICD10-J43.9). Electronically Signed   By: Marijo Conception M.D.   On: 05/29/2022 14:46   DG Abd Portable 1V  Result Date: 05/28/2022 CLINICAL DATA:  Feeding tube placement EXAM: PORTABLE ABDOMEN - 1 VIEW limited for tube placement COMPARISON:  05/27/2022 FINDINGS: Limited x-ray for tube placement demonstrates the Dobbhoff tube with tip along the extreme distal stomach or proximal duodenum. Gas is seen along nondilated loops of bowel elsewhere in the upper abdomen. Overlapping cardiac leads. Portable x-ray IMPRESSION: Limited x-ray demonstrates Dobbhoff tube overlying the extreme distal stomach or proximal duodenum. Electronically Signed   By: Jill Side M.D.   On: 05/28/2022 10:42   CT ANGIO HEAD NECK W WO CM  Result Date: 05/28/2022 CLINICAL DATA:  Neuro deficit with acute stroke suspected EXAM: CT ANGIOGRAPHY HEAD AND NECK TECHNIQUE: Multidetector CT imaging of the head and neck was performed using the standard protocol during bolus administration of intravenous contrast. Multiplanar CT image reconstructions and MIPs were obtained to evaluate the vascular anatomy. Carotid stenosis measurements (when applicable) are obtained utilizing NASCET criteria, using the distal internal carotid diameter as the denominator. RADIATION DOSE REDUCTION: This exam was performed according to the departmental dose-optimization program which includes automated exposure control, adjustment of the mA and/or kV according to patient size and/or use of iterative  reconstruction technique. CONTRAST:  80m OMNIPAQUE IOHEXOL 350 MG/ML SOLN COMPARISON:  Head CT from earlier today FINDINGS: CTA NECK FINDINGS Aortic arch: Limited coverage without acute finding Right carotid system: Atheromatous calcification at the bifurcation. No stenosis or ulceration Left carotid system: Mild calcified plaque at the bifurcation. No stenosis or ulceration Vertebral arteries: Left dominant vertebral artery. The subclavian and vertebral arteries are smoothly contoured and widely patent. Skeleton: No acute finding. Other neck: No acute finding Upper chest: Intubation with unremarkable hardware where covered. Panlobular emphysema at the apices, severe. Review of the MIP images confirms the above findings CTA HEAD FINDINGS Anterior circulation: Atheromatous calcification along the carotid siphons. No branch occlusion, beading, or aneurysm Posterior circulation: Left dominant vertebral artery. Atheromatous irregularity with mild narrowing of the basilar. No branch occlusion, beading, or aneurysm. Venous sinuses: Unremarkable for the arterial phase Anatomic variants: None significant Review of the MIP images confirms the above findings IMPRESSION: 1. No emergent finding. 2. Mild for age atherosclerosis without flow limiting stenosis or ulceration of major vessels. 3. Severe panlobular emphysema at the apices. Electronically Signed   By: JJorje GuildM.D.   On: 05/28/2022 05:28   CT HEAD WO CONTRAST (5MM)  Result Date: 05/28/2022 CLINICAL DATA:  Anisocoria EXAM: CT HEAD WITHOUT CONTRAST TECHNIQUE: Contiguous axial images were obtained from the base of the skull through the vertex without intravenous contrast. RADIATION DOSE REDUCTION: This exam was performed according to the departmental dose-optimization program which includes automated exposure control, adjustment of the mA and/or kV according to patient size and/or use of iterative reconstruction technique. COMPARISON:  09/29/2003 FINDINGS:  Brain: No mass, hemorrhage  or extra-axial collection. There is focal hypoattenuation within the medial left cerebellar hemisphere. This could indicate a subacute infarct. Old left frontal infarct. There is periventricular hypoattenuation compatible with chronic microvascular disease. Mild generalized volume loss. Vascular: Atherosclerotic calcification of the internal carotid arteries at the skull base. Skull: Normal Sinuses/Orbits: Normal Other: None IMPRESSION: 1. Possible subacute infarct of the medial left cerebellar hemisphere. MRI is recommended. 2. Old left frontal infarct. Chronic ischemic microangiopathic white matter changes. These results will be called to the ordering clinician or representative by the Radiologist Assistant, and communication documented in the PACS or [employer]. Electronically Signed   By: Ulyses Jarred M.D.   On: 05/28/2022 02:10   DG Abd Portable 1V  Result Date: 05/27/2022 CLINICAL DATA:  NG tube placement EXAM: PORTABLE ABDOMEN - 1 VIEW COMPARISON:  Report of a CT scan from 2005. FINDINGS: Limited x-ray for tube placement includes the lower thorax and upper abdomen. Enteric tube folded upon itself along the stomach. Gas is seen along nondilated loops of bowel elsewhere in the upper abdomen. Overlapping cardiac leads. IMPRESSION: Limited x-ray for tube placement has NG tube along the upper stomach Electronically Signed   By: Jill Side M.D.   On: 05/27/2022 17:26   DG CHEST PORT 1 VIEW  Result Date: 05/27/2022 CLINICAL DATA:  [id number] Encounter for intubation [id number] EXAM: PORTABLE CHEST 1 VIEW COMPARISON:  Radiograph 05/25/2022 FINDINGS: Endotracheal tube overlies the midthoracic trachea approximately 5.4 cm above the carina. Unchanged cardiomediastinal silhouette. Pulmonary hyperinflation. Emphysema. Unchanged bibasilar atelectasis or scarring. No new airspace disease. Unchanged linear metallic density overlying the right lower thorax, correlates with a foreign body in  the anterior right lower chest wall on prior CT in November 2023. IMPRESSION: Endotracheal tube overlies the midthoracic trachea approximately 5.4 cm above the carina. Emphysema with bibasilar scarring and/or atelectasis. Electronically Signed   By: Maurine Simmering M.D.   On: 05/27/2022 14:48   DG CHEST PORT 1 VIEW  Result Date: 05/25/2022 CLINICAL DATA:  SOB chronic cough EXAM: PORTABLE CHEST - 1 VIEW COMPARISON:  05/24/2022 FINDINGS: The heart size and mediastinal contours are within normal limits. Lungs are hyperinflated suggesting COPD. Linear bibasilar subsegmental atelectasis or scarring. Lungs are otherwise clear. No pneumothorax or pleural effusion. Aorta is calcified. IMPRESSION: Findings suggest COPD with bibasilar subsegmental atelectasis or scarring. Otherwise no active cardiopulmonary disease. Electronically Signed   By: Sammie Bench M.D.   On: 05/25/2022 11:02   DG Chest Port 1 View  Result Date: 05/24/2022 CLINICAL DATA:  Pneumonia. EXAM: PORTABLE CHEST 1 VIEW COMPARISON:  05/22/2022 FINDINGS: Lungs are hyperinflated. Emphysematous changes are noted in the UPPER lobes, similar in appearance to prior study. Crowded lung markings at the bases. No new consolidations or pleural effusions. IMPRESSION: 1. Emphysema. 2. No evidence of acute infiltrates or pleural effusions. Electronically Signed   By: Nolon Nations M.D.   On: 05/24/2022 08:58   VAS [place] LOWER EXTREMITY VENOUS (DVT)  Result Date: 05/22/2022  Lower Venous DVT Study Patient Name:  HANAKO TIPPING  Date of Exam:   05/22/2022 Medical Rec #: [id number]       Accession #:    [id number] Date of Birth: 1951-04-01      Patient Gender: F Patient Age:   45 years Exam Location:  [facility] Procedure:      VAS [place] LOWER EXTREMITY VENOUS (DVT) Referring Phys: Ina Homes --------------------------------------------------------------------------------  Indications: Edema.  Comparison Study: no prior Performing Technologist: Archie Patten RVS  Examination  Guidelines: A complete evaluation includes B-mode imaging, spectral Doppler, color Doppler, and power Doppler as needed of all accessible portions of each vessel. Bilateral testing is considered an integral part of a complete examination. Limited examinations for reoccurring indications may be performed as noted. The reflux portion of the exam is performed with the patient in reverse Trendelenburg.  +---------+---------------+---------+-----------+----------+--------------+ RIGHT    CompressibilityPhasicitySpontaneityPropertiesThrombus Aging +---------+---------------+---------+-----------+----------+--------------+ CFV      Full           Yes      Yes                                 +---------+---------------+---------+-----------+----------+--------------+ SFJ      Full                                                        +---------+---------------+---------+-----------+----------+--------------+ FV Prox  Full                                                        +---------+---------------+---------+-----------+----------+--------------+ FV Mid   Full                                                        +---------+---------------+---------+-----------+----------+--------------+ FV DistalFull                                                        +---------+---------------+---------+-----------+----------+--------------+ PFV      Full                                                        +---------+---------------+---------+-----------+----------+--------------+ POP      Full           Yes      Yes                                 +---------+---------------+---------+-----------+----------+--------------+ PTV      Full                                                        +---------+---------------+---------+-----------+----------+--------------+ PERO     Full                                                         +---------+---------------+---------+-----------+----------+--------------+   +---------+---------------+---------+-----------+----------+--------------+  LEFT     CompressibilityPhasicitySpontaneityPropertiesThrombus Aging +---------+---------------+---------+-----------+----------+--------------+ CFV      Full           Yes      Yes                                 +---------+---------------+---------+-----------+----------+--------------+ SFJ      Full                                                        +---------+---------------+---------+-----------+----------+--------------+ FV Prox  Full                                                        +---------+---------------+---------+-----------+----------+--------------+ FV Mid   Full                                                        +---------+---------------+---------+-----------+----------+--------------+ FV DistalFull                                                        +---------+---------------+---------+-----------+----------+--------------+ PFV      Full                                                        +---------+---------------+---------+-----------+----------+--------------+ POP      Full           Yes      Yes                                 +---------+---------------+---------+-----------+----------+--------------+ PTV      Full                                                        +---------+---------------+---------+-----------+----------+--------------+ PERO     Full           Yes      Yes                                 +---------+---------------+---------+-----------+----------+--------------+     Summary: BILATERAL: - No evidence of deep vein thrombosis seen in the lower extremities, bilaterally. -No evidence of popliteal cyst, bilaterally.   *See table(s) above for measurements and observations. Electronically signed by Harold Barban MD on 05/22/2022 at 5:20:52  PM.    Final    DG  CHEST PORT 1 VIEW  Result Date: 05/22/2022 CLINICAL DATA:  Respiratory distress. EXAM: PORTABLE CHEST 1 VIEW COMPARISON:  Chest x-ray dated May 19, 2022. FINDINGS: The heart size and mediastinal contours are within normal limits. The lungs remain hyperinflated with severe emphysematous changes and coarse interstitial markings at the lung bases. Superimposed bibasilar opacities seen on the prior study have nearly resolved. No pleural effusion or pneumothorax. No acute osseous abnormality. IMPRESSION: 1. Nearly resolved bibasilar pneumonia. 2. COPD. Electronically Signed   By: Titus Dubin M.D.   On: 05/22/2022 10:31   DG Chest Portable 1 View  Result Date: 05/19/2022 CLINICAL DATA:  Shortness of breath EXAM: PORTABLE CHEST 1 VIEW COMPARISON:  Twenty days ago FINDINGS: Severe emphysema with apical lucency and hyperinflation. Interstitial coarsening at the bases is stable. There is no edema, consolidation, effusion, or pneumothorax. Heart size and mediastinal contours. Clips over the left breast. IMPRESSION: Severe emphysema.  No acute finding when compared to prior. Electronically Signed   By: Jorje Guild M.D.   On: 05/19/2022 07:26    Labs:  CBC: Recent Labs    06/01/22 0052 06/02/22 0351 06/03/22 0159 06/06/22 1456  WBC 7.7 8.3 9.9 6.0  HGB 9.6* 9.4* 9.9* 9.4*  HCT 32.5* 30.8* 32.1* 31.1*  PLT 108* 127* 157 157    COAGS: No results for input(s): "INR", "APTT" in the last 8760 hours.  BMP: Recent Labs    06/06/22 0407 06/07/22 0417 06/08/22 0629 06/10/22 0410  NA 144 146* 148* 148*  K 4.4 4.2 4.6 3.9  CL 101 100 103 99  CO2 36* 36* 34* 39*  GLUCOSE 161* 156* 150* 132*  BUN 22 26* 26* 24*  CALCIUM 8.5* 8.7* 9.0 9.2  CREATININE 0.41* 0.35* 0.41* 0.42*  GFRNONAA >60 >60 >60 >60    LIVER FUNCTION TESTS: Recent Labs    04/09/22 0745 05/08/22 0123 05/19/22 0655 05/28/22 0415  BILITOT 1.0 1.2 2.2* 1.7*  AST 25 14* 27 35  ALT 25 31 42 65*   ALKPHOS 104 60 66 63  PROT 6.2* 5.4* 5.9* 6.3*  ALBUMIN 3.6 3.2* 3.9 4.1    TUMOR MARKERS: No results for input(s): "AFPTM", "CEA", "CA199", "CHROMGRNA" in the last 8760 hours.  Assessment and Plan:  Dysphagia Deconditioning Need for LTACH soon Hypoxia/ COPD/ RSV PNA Tx complete On ASA 81 daily-- need off 5 days to safely place perc G tube Chat into CCM regarding asa hold Will plan for 1/22 Monday I have spoken to Dtr Abigail via phone and she is aware  Risks and benefits image guided gastrostomy tube placement was discussed with the patient's dtr Abigail Castillo via phone including, but not limited to the need for a barium enema during the procedure, bleeding, infection, peritonitis and/or damage to adjacent structures.  All questions were answered, patient is agreeable to proceed.  Consent signed and in chart.   Thank you for this interesting consult.  I greatly enjoyed meeting Abigail Castillo and look forward to participating in their care.  A copy of this report was sent to the requesting provider on this date.  Electronically Signed: Lavonia Drafts, PA-C 06/10/2022, 2:53 PM   I spent a total of 20 Minutes    in face to face in clinical consultation, greater than 50% of which was counseling/coordinating care for percutaneous gastric tube placement

## 2022-06-10 NOTE — Progress Notes (Signed)
Will hold aspirin until after PEG per IR request  [name] ACNP-BC [place] Pager # [phone] OR # [phone] if no answer

## 2022-06-11 DIAGNOSIS — J9621 Acute and chronic respiratory failure with hypoxia: Secondary | ICD-10-CM | POA: Diagnosis not present

## 2022-06-11 DIAGNOSIS — J9602 Acute respiratory failure with hypercapnia: Secondary | ICD-10-CM | POA: Diagnosis not present

## 2022-06-11 DIAGNOSIS — J441 Chronic obstructive pulmonary disease with (acute) exacerbation: Secondary | ICD-10-CM | POA: Diagnosis not present

## 2022-06-11 DIAGNOSIS — J9622 Acute and chronic respiratory failure with hypercapnia: Secondary | ICD-10-CM | POA: Diagnosis not present

## 2022-06-11 LAB — BASIC METABOLIC PANEL
Anion gap: 9 (ref 5–15)
BUN: 26 mg/dL — ABNORMAL HIGH (ref 8–23)
CO2: 41 mmol/L — ABNORMAL HIGH (ref 22–32)
Calcium: 9.1 mg/dL (ref 8.9–10.3)
Chloride: 92 mmol/L — ABNORMAL LOW (ref 98–111)
Creatinine, Ser: 0.48 mg/dL (ref 0.44–1.00)
GFR, Estimated: >60 mL/min (ref >60)
Glucose, Bld: 182 mg/dL — ABNORMAL HIGH (ref 70–99)
Potassium: 3.9 mmol/L (ref 3.5–5.1)
Sodium: 142 mmol/L (ref 135–145)

## 2022-06-11 LAB — GLUCOSE, CAPILLARY
Glucose-Capillary: 137 mg/dL — ABNORMAL HIGH (ref 70–99)
Glucose-Capillary: 145 mg/dL — ABNORMAL HIGH (ref 70–99)
Glucose-Capillary: 207 mg/dL — ABNORMAL HIGH (ref 70–99)
Glucose-Capillary: 222 mg/dL — ABNORMAL HIGH (ref 70–99)
Glucose-Capillary: 182 mg/dL — ABNORMAL HIGH (ref 70–99)

## 2022-06-11 NOTE — Progress Notes (Addendum)
Occupational Therapy Treatment Patient Details Name: CATELYN FRIEL MRN: [id number] DOB: December 26, 1950 Today's Date: 06/11/2022   History of present illness Mrs. Mayberry is a 72 y.o. F recently admitted for COPD flare, discharged to SNF who returned due to SpO2 65%. Found to be  RSV+, pCO2 80, started on BiPAP.   Head CT showing possible subacute infarct of the medial left cerebellar hemisphere. Failed bipap wean on 1/2, intubated. Tracheostomy 1/4. Pt with COPD and chronic respiratory failure on 2-3L home O2, depression/anxiety and hx BrCA   OT comments  Goals updated. Miosha is making incremental progress. Soiled upon arrival and dependently cleaned at bed level with min G rolling. Bed put in chair position for session. Pt tolerated BUE exercises and grooming for continued UE strengthening. Pt wrote "brush hair" as her personal goal. She tolerated ~1 minute of repetitive hair brushing at a time with rest break between trials. OT to continue to follow acutely. POC remains appropriate.    Recommendations for follow up therapy are one component of a multi-disciplinary discharge planning process, led by the attending physician.  Recommendations may be updated based on patient status, additional functional criteria and insurance authorization.    Follow Up Recommendations  OT at Long-term acute care hospital     Assistance Recommended at Discharge Frequent or constant Supervision/Assistance  Patient can return home with the following  Two people to help with walking and/or transfers;Two people to help with bathing/dressing/bathroom;Assistance with feeding;Direct supervision/assist for medications management;Assistance with cooking/housework;Direct supervision/assist for financial management;Assist for transportation;Help with stairs or ramp for entrance   Equipment Recommendations  Other (comment)       Precautions / Restrictions Precautions Precautions: Fall Precaution Comments: trach, vent vs  trach collar Restrictions Weight Bearing Restrictions: No       Mobility Bed Mobility Overal bed mobility: Needs Assistance Bed Mobility: Rolling Rolling: Min guard         General bed mobility comments: cues    Transfers                         Balance Overall balance assessment: Needs assistance           ADL either performed or assessed with clinical judgement   ADL Overall ADL's : Needs assistance/impaired     Grooming: Moderate assistance Grooming Details (indicate cue type and reason): needs assist to reach L sid eof head                     Toileting- Clothing Manipulation and Hygiene: Total assistance Toileting - Clothing Manipulation Details (indicate cue type and reason): soiled upon arrival, dependent for hygiene at bed level       General ADL Comments: bed to chair position for BUE exercises, face washing and hair brushing    Extremity/Trunk Assessment Upper Extremity Assessment Upper Extremity Assessment: Generalized weakness   Lower Extremity Assessment Lower Extremity Assessment: Defer to PT evaluation        Vision   Vision Assessment?: Vision impaired- to be further tested in functional context          Cognition Arousal/Alertness: Awake/alert Behavior During Therapy: Flat affect Overall Cognitive Status: Difficult to assess                 General Comments: communicatin gwith head nodds, mouthing and hand writing this date        Exercises Other Exercises Other Exercises: alternating bilateral UE punches towards ceiling x10 each  arm    Shoulder Instructions       General Comments full vent support 40% FiO2 PEEP 5    Pertinent Vitals/ Pain       Pain Assessment Pain Assessment: Faces Faces Pain Scale: Hurts a little bit Pain Intervention(s): Monitored during session   Frequency  Min 2X/week        Progress Toward Goals  OT Goals(current goals can now be found in the care plan  section)  Progress towards OT goals: Progressing toward goals  Acute Rehab OT Goals Patient Stated Goal: "hair brushed" OT Goal Formulation: With patient Time For Goal Achievement: 06/25/22 Potential to Achieve Goals: Fair ADL Goals Pt Will Perform Grooming: with set-up;sitting Pt Will Perform Lower Body Bathing: with modified independence;sit to/from stand Pt Will Perform Upper Body Dressing: sitting;with min assist Pt Will Perform Lower Body Dressing: with modified independence;sit to/from stand Pt Will Transfer to Toilet: with modified independence;ambulating;bedside commode Pt Will Perform Toileting - Clothing Manipulation and hygiene: with modified independence;sit to/from stand Pt/caregiver will Perform Home Exercise Program: Increased strength;Both right and left upper extremity;With Supervision Additional ADL Goal #1: Pt will perform bed mobility with max assist in preparation for ADLs. Additional ADL Goal #2: Pt will demonstrate fair sitting balance x 10 minutes in preparation for ADLs.  Plan Discharge plan needs to be updated       AM-PAC OT "6 Clicks" Daily Activity     Outcome Measure   Help from another person eating meals?: Total Help from another person taking care of personal grooming?: A Lot Help from another person toileting, which includes using toliet, bedpan, or urinal?: Total Help from another person bathing (including washing, rinsing, drying)?: A Lot Help from another person to put on and taking off regular upper body clothing?: A Lot Help from another person to put on and taking off regular lower body clothing?: Total 6 Click Score: 9    End of Session    OT Visit Diagnosis: Muscle weakness (generalized) (M62.81)   Activity Tolerance Patient tolerated treatment well   Patient Left in bed;with call bell/phone within reach;Other (comment) (brushing hair)   Nurse Communication Mobility status        Time: 3419-3790 OT Time Calculation (min): 20  min  Charges: OT General Charges $OT Visit: 1 Visit OT Treatments $Self Care/Home Management : 8-22 mins   Elliot Cousin 06/11/2022, 1:07 PM

## 2022-06-11 NOTE — TOC Progression Note (Signed)
Transition of Care [facility]) - Progression Note    Patient Details  Name: Abigail Castillo MRN: [id number] Date of Birth: [birth year]  Transition of Care [facility]) CM/SW [place], [place] Phone Number: 06/11/2022, 3:19 PM  Clinical Narrative:     CSW following to fax out for vent/snf placement closer to patient being medically ready. Patient currently has cortrak. CSW will continue to follow and assist with patients dc planning needs.   Expected Discharge Plan: [place] (Vent/SNF) Barriers to Discharge: Continued Medical Work up  Expected Discharge Plan and Services In-house Referral: Clinical Social Work   Post Acute Care Choice: Long Term Acute Care (LTAC) Living arrangements for the past 2 months:  (PTA came from [place] short term before that was from home alone)                                       Social Determinants of Health (SDOH) Interventions [place]: No Food Insecurity (05/30/2022)  Housing: Low Risk  (05/30/2022)  Transportation Needs: No Transportation Needs (05/30/2022)  Utilities: Not At Risk (05/30/2022)  Alcohol Screen: Low Risk  (03/26/2021)  Depression (PHQ2-9): Low Risk  (03/26/2021)  Financial Resource Strain: Low Risk  (03/26/2021)  Physical Activity: Inactive (03/26/2021)  Social Connections: Moderately Isolated (03/26/2021)  Stress: No Stress Concern Present (03/26/2021)  Tobacco Use: Medium Risk (05/19/2022)    Readmission Risk Interventions     No data to display

## 2022-06-11 NOTE — Plan of Care (Signed)
Problem: Education: Goal: Knowledge of disease or condition will improve 06/11/2022 0002 by Driscilla Grammes, Ardeen Fillers, RN Outcome: Progressing 06/11/2022 0001 by Tillie Fantasia, RN Outcome: Progressing Goal: Knowledge of the prescribed therapeutic regimen will improve 06/11/2022 0002 by Tillie Fantasia, RN Outcome: Progressing 06/11/2022 0001 by Tillie Fantasia, RN Outcome: Progressing Goal: Individualized Educational Video(s) 06/11/2022 0002 by Driscilla Grammes, Ardeen Fillers, RN Outcome: Progressing 06/11/2022 0001 by Tillie Fantasia, RN Outcome: Progressing   Problem: Activity: Goal: Ability to tolerate increased activity will improve 06/11/2022 0002 by Driscilla Grammes, Ardeen Fillers, RN Outcome: Progressing 06/11/2022 0001 by Driscilla Grammes, Ardeen Fillers, RN Outcome: Progressing Goal: Will verbalize the importance of balancing activity with adequate rest periods 06/11/2022 0002 by Tillie Fantasia, RN Outcome: Progressing 06/11/2022 0001 by Tillie Fantasia, RN Outcome: Progressing   Problem: Respiratory: Goal: Ability to maintain a clear airway will improve 06/11/2022 0002 by Tillie Fantasia, RN Outcome: Progressing 06/11/2022 0001 by Tillie Fantasia, RN Outcome: Progressing Goal: Levels of oxygenation will improve 06/11/2022 0002 by Tillie Fantasia, RN Outcome: Progressing 06/11/2022 0001 by Tillie Fantasia, RN Outcome: Progressing Goal: Ability to maintain adequate ventilation will improve 06/11/2022 0002 by Tillie Fantasia, RN Outcome: Progressing 06/11/2022 0001 by Tillie Fantasia, RN Outcome: Progressing   Problem: Education: Goal: Knowledge of General Education information will improve Description: Including pain rating scale, medication(s)/side effects and non-pharmacologic comfort measures 06/11/2022 0002 by Tillie Fantasia, RN Outcome:  Progressing 06/11/2022 0001 by Tillie Fantasia, RN Outcome: Progressing   Problem: Health Behavior/Discharge Planning: Goal: Ability to manage health-related needs will improve 06/11/2022 0002 by Tillie Fantasia, RN Outcome: Progressing 06/11/2022 0001 by Tillie Fantasia, RN Outcome: Progressing   Problem: Clinical Measurements: Goal: Ability to maintain clinical measurements within normal limits will improve 06/11/2022 0002 by Tillie Fantasia, RN Outcome: Progressing 06/11/2022 0001 by Tillie Fantasia, RN Outcome: Progressing Goal: Will remain free from infection 06/11/2022 0002 by Tillie Fantasia, RN Outcome: Progressing 06/11/2022 0001 by Tillie Fantasia, RN Outcome: Progressing Goal: Diagnostic test results will improve 06/11/2022 0002 by Tillie Fantasia, RN Outcome: Progressing 06/11/2022 0001 by Tillie Fantasia, RN Outcome: Progressing Goal: Respiratory complications will improve 06/11/2022 0002 by Tillie Fantasia, RN Outcome: Progressing 06/11/2022 0001 by Tillie Fantasia, RN Outcome: Progressing Goal: Cardiovascular complication will be avoided 06/11/2022 0002 by Tillie Fantasia, RN Outcome: Progressing 06/11/2022 0001 by Tillie Fantasia, RN Outcome: Progressing   Problem: Activity: Goal: Risk for activity intolerance will decrease 06/11/2022 0002 by Driscilla Grammes, Ardeen Fillers, RN Outcome: Progressing 06/11/2022 0001 by Tillie Fantasia, RN Outcome: Progressing   Problem: Nutrition: Goal: Adequate nutrition will be maintained 06/11/2022 0002 by Tillie Fantasia, RN Outcome: Progressing 06/11/2022 0001 by Tillie Fantasia, RN Outcome: Progressing   Problem: Coping: Goal: Level of anxiety will decrease 06/11/2022 0002 by Tillie Fantasia, RN Outcome: Progressing 06/11/2022 0001 by Tillie Fantasia, RN Outcome: Progressing   Problem: Elimination: Goal: Will not experience complications related to bowel motility 06/11/2022 0002 by Tillie Fantasia, RN Outcome: Progressing 06/11/2022 0001 by Tillie Fantasia, RN Outcome: Progressing Goal: Will not experience complications related to urinary retention 06/11/2022 0002 by Tillie Fantasia, RN Outcome: Progressing 06/11/2022 0001 by Driscilla Grammes,  Ardeen Fillers, RN Outcome: Progressing   Problem: Pain Managment: Goal: General experience of comfort will improve 06/11/2022 0002 by Driscilla Grammes, Ardeen Fillers, RN Outcome: Progressing 06/11/2022 0001 by Driscilla Grammes, Ardeen Fillers, RN Outcome: Progressing   Problem: Safety: Goal: Ability to remain free from injury will improve 06/11/2022 0002 by Tillie Fantasia, RN Outcome: Progressing 06/11/2022 0001 by Tillie Fantasia, RN Outcome: Progressing   Problem: Skin Integrity: Goal: Risk for impaired skin integrity will decrease 06/11/2022 0002 by Tillie Fantasia, RN Outcome: Progressing 06/11/2022 0001 by Tillie Fantasia, RN Outcome: Progressing   Problem: Education: Goal: Ability to describe self-care measures that may prevent or decrease complications (Diabetes Survival Skills Education) will improve 06/11/2022 0002 by Tillie Fantasia, RN Outcome: Progressing 06/11/2022 0001 by Tillie Fantasia, RN Outcome: Progressing Goal: Individualized Educational Video(s) 06/11/2022 0002 by Driscilla Grammes, Ardeen Fillers, RN Outcome: Progressing 06/11/2022 0001 by Tillie Fantasia, RN Outcome: Progressing   Problem: Coping: Goal: Ability to adjust to condition or change in health will improve 06/11/2022 0002 by Tillie Fantasia, RN Outcome: Progressing 06/11/2022 0001 by Tillie Fantasia, RN Outcome: Progressing   Problem: Fluid Volume: Goal: Ability to maintain  a balanced intake and output will improve 06/11/2022 0002 by Tillie Fantasia, RN Outcome: Progressing 06/11/2022 0001 by Tillie Fantasia, RN Outcome: Progressing   Problem: Health Behavior/Discharge Planning: Goal: Ability to identify and utilize available resources and services will improve 06/11/2022 0002 by Tillie Fantasia, RN Outcome: Progressing 06/11/2022 0001 by Tillie Fantasia, RN Outcome: Progressing Goal: Ability to manage health-related needs will improve 06/11/2022 0002 by Tillie Fantasia, RN Outcome: Progressing 06/11/2022 0001 by Tillie Fantasia, RN Outcome: Progressing   Problem: Metabolic: Goal: Ability to maintain appropriate glucose levels will improve 06/11/2022 0002 by Tillie Fantasia, RN Outcome: Progressing 06/11/2022 0001 by Tillie Fantasia, RN Outcome: Progressing   Problem: Nutritional: Goal: Maintenance of adequate nutrition will improve 06/11/2022 0002 by Tillie Fantasia, RN Outcome: Progressing 06/11/2022 0001 by Tillie Fantasia, RN Outcome: Progressing Goal: Progress toward achieving an optimal weight will improve 06/11/2022 0002 by Tillie Fantasia, RN Outcome: Progressing 06/11/2022 0001 by Tillie Fantasia, RN Outcome: Progressing   Problem: Skin Integrity: Goal: Risk for impaired skin integrity will decrease 06/11/2022 0002 by Tillie Fantasia, RN Outcome: Progressing 06/11/2022 0001 by Tillie Fantasia, RN Outcome: Progressing   Problem: Tissue Perfusion: Goal: Adequacy of tissue perfusion will improve 06/11/2022 0002 by Tillie Fantasia, RN Outcome: Progressing 06/11/2022 0001 by Tillie Fantasia, RN Outcome: Progressing   Problem: Education: Goal: Knowledge about tracheostomy care/management will improve 06/11/2022 0002 by Tillie Fantasia, RN Outcome:  Progressing 06/11/2022 0001 by Tillie Fantasia, RN Outcome: Progressing   Problem: Activity: Goal: Ability to tolerate increased activity will improve 06/11/2022 0002 by Driscilla Grammes, Ardeen Fillers, RN Outcome: Progressing 06/11/2022 0001 by Tillie Fantasia, RN Outcome: Progressing   Problem: Health Behavior/Discharge Planning: Goal: Ability to manage tracheostomy will improve 06/11/2022 0002 by Tillie Fantasia, RN Outcome: Progressing 06/11/2022 0001 by Tillie Fantasia, RN Outcome: Progressing   Problem: Respiratory: Goal: Patent airway maintenance will improve 06/11/2022 0002 by Tillie Fantasia, RN Outcome: Progressing 06/11/2022 0001 by Tillie Fantasia, RN Outcome: Progressing   Problem: Role Relationship: Goal: Ability  to communicate will improve 06/11/2022 0002 by Driscilla Grammes, Ardeen Fillers, RN Outcome: Progressing 06/11/2022 0001 by Tillie Fantasia, RN Outcome: Progressing

## 2022-06-11 NOTE — Progress Notes (Signed)
NAME:  Abigail Castillo, MRN:  [id number], DOB:  1950-10-22, LOS: 44 ADMISSION DATE:  05/19/2022, CONSULTATION DATE:  05/20/22 REFERRING MD:  Roger Shelter CHIEF COMPLAINT:  Dyspnea   History of Present Illness:  Abigail Castillo is a 72 y.o. female who has a PMH as below including but not limited to COPD on chronic 2-3L O2 and '10mg'$  Prednisone, OSA on nocturnal BiPAP, and who is followed by Dr. Lamonte Sakai in our office. She had recent admission 11/15 through 12/18 for AECOPD and was then discharged to [facility].  She was recently seen as an outpatient by Dr. Lamonte Sakai on 12/22 and was prescribed a slow prednisone taper with instructions to drop to '10mg'$  and continue until her next follow up appointment.  She presented back to [facility] ED 12/25 with dyspnea and hypoxia down to 65%. She was found to have hypoxic and hypercapnic respiratory failure and her RSV swab returned positive. She was placed on BiPAP and was admitted by [facility].  On 12/26, PCCM was consulted for assistance with BiPAP and COPD management.  Pertinent  Medical History:  has History of tobacco abuse; Depression; COPD (chronic obstructive pulmonary disease) ([place]); Osteoporosis; HOARSENESS, CHRONIC; HYPERTRIGLYCERIDEMIA; Memory change; B12 deficiency; Personal history of colonic polyps; Encounter for routine gynecological examination; Mild hyperlipidemia; Vitamin D deficiency; Esophageal stricture; GERD (gastroesophageal reflux disease); Schizoaffective disorder ([place]); Allergic rhinitis; Chronic respiratory failure ([place]); Acute on chronic respiratory failure with hypoxia ([place]); Elevated blood pressure reading without diagnosis of hypertension; Hepatic steatosis; Ductal carcinoma in situ (DCIS) of left breast; On home oxygen therapy; Hyponatremia; Bronchomalacia; Localized edema; COPD exacerbation ([place]); Tracheobronchomalacia; Acute on chronic respiratory failure with hypoxia and hypercapnia (HCC); COPD with acute exacerbation ([place]); RSV (respiratory syncytial  virus pneumonia); Essential hypertension; Malnutrition of moderate degree; Acute respiratory failure with hypercapnia ([place]); Pressure injury of skin; and Tracheostomy in place [facility]) on their problem list.  Significant Hospital Events: Including procedures, antibiotic start and stop dates in addition to other pertinent events   12/25 admit. 12/26 PCCM consult. 1/2 Failed BiPAP wean, Intubated 1/3 CT Head negative was completed for observed pupillary change 1/4 tracheostomy 1/8 CT head for anisocoria; neg,  1/11 continuing progressive trach collar trials 1/12 BID trach collar trials, appears comfortable  1/14 failed PSV 1/15 pulse prednisone at 40 mg continues to fail SBT trials LTAC denied awaiting vent SNF 1/16 tolerated 1 hr atc. ASA placed on hold at request of IR for PEG.   Interim History / Subjective:  Very weak   Objective:  Blood pressure 133/76, pulse 96, temperature 97.8 F (36.6 C), temperature source Oral, resp. rate (Abnormal) 23, height '5\' 2"'$  (1.575 m), weight 60.2 kg, SpO2 100 %.    Vent Mode: PRVC FiO2 (%):  [35 %-40 %] 40 % Set Rate:  [18 bmp] 18 bmp Vt Set:  [400 mL] 400 mL PEEP:  [5 cmH20] 5 cmH20 Plateau Pressure:  [14 cmH20] 14 cmH20   Intake/Output Summary (Last 24 hours) at 06/11/2022 0818 Last data filed at 06/11/2022 0426 Gross per 24 hour  Intake 1800 ml  Output 1300 ml  Net 500 ml   Filed Weights   06/07/22 0426 06/10/22 0500 06/11/22 0500  Weight: 63.8 kg 61.1 kg 60.2 kg   Physical Examination: General chronically ill-appearing 72 year old female currently resting in bed she is in no acute distress resting comfortably HEENT normocephalic atraumatic heart size 6 tracheostomy is midline and unremarkable Pulmonary: Very prolonged expiratory phase with diminished air movement throughout no accessory use this morning Cardiac:  Regular rate and rhythm Abdomen: Soft nontender no organomegaly Extremities: Warm dry brisk cap refill Neuro: Awake oriented  appropriate moving all extremities.   Assessment & Plan:   Acute on chronic respiratory failure with hypoxia/hypercarbia Severe COPD with RSV-induced flare now causing inability to wean from vent, s/p tracheostomy 05/29/22 -given the severity of her disease and deconditioning anticipate long road to recovery, she tolerated about 1 hour of aerosol trach collar yesterday Plan Continue daily assessment for pressure support ventilation and aerosol trach collar Suspect will need to approach in LTAC type strategy: Aiming for 1-1/2 to 2 hours of aerosol trach collar today, when she tolerates 2 hours of trach collar with equal rest in between efforts will try to increase by 1 hour every other day  Started prednisone pulse 40 mg on 1/15, decreased to 30 mg on 1/19 and slowly decrease further depending on clinical response Cont torsemide as long as BUN and creatinine tolerate, awaiting chemistry this morning continue brovana, yupelri and pulmicort PT/OT/SLP efforts  Will need event/SNF  Moderate hypernatremia. Water deficit was 1.57 L, we had increased free water replacement Plan Continue every 4 free water replacement 200 cc an hour Repeat blood chemistry today  Chronic anxiety/depression Seems well-controlled with current regimen, no change needed plan continue clonidine, buspar, zyprexa  klonopin QHS and BID PRN   GERD Plan Continue Pepcid (she is on home Dexilant but we don't have PPI elixir currently)   Breast ca Plan arimidex  L cerebellar and L frontal stroke- age indeterminate; CTA head/neck with nothing actionable.  Tele w/o afib.  Cannot get MRI due to hardware. Plan PT/OT Holding aspirin until after PEG tube per interventional radiology request  DM with hyperglycemia Plan Cont ssi goal 140-180 Semglee 10 units/d, continue  Urinary retention- recurrent issue; UA neg Plan Continuing as needed bladder scans Has external Foley  Anisocoria- chronic  Plan Nothing to  do   My cct 32 min    [name] ACNP-BC [place] Pager # [phone] OR # [phone] if no answer

## 2022-06-11 NOTE — Plan of Care (Signed)
  Problem: Education: Goal: Knowledge of disease or condition will improve Outcome: Progressing Goal: Knowledge of the prescribed therapeutic regimen will improve Outcome: Progressing Goal: Individualized Educational Video(s) Outcome: Progressing   Problem: Activity: Goal: Ability to tolerate increased activity will improve Outcome: Progressing Goal: Will verbalize the importance of balancing activity with adequate rest periods Outcome: Progressing   Problem: Respiratory: Goal: Ability to maintain a clear airway will improve Outcome: Progressing Goal: Levels of oxygenation will improve Outcome: Progressing Goal: Ability to maintain adequate ventilation will improve Outcome: Progressing   Problem: Education: Goal: Knowledge of General Education information will improve Description: Including pain rating scale, medication(s)/side effects and non-pharmacologic comfort measures Outcome: Progressing   Problem: Health Behavior/Discharge Planning: Goal: Ability to manage health-related needs will improve Outcome: Progressing   Problem: Clinical Measurements: Goal: Ability to maintain clinical measurements within normal limits will improve Outcome: Progressing Goal: Will remain free from infection Outcome: Progressing Goal: Diagnostic test results will improve Outcome: Progressing Goal: Respiratory complications will improve Outcome: Progressing Goal: Cardiovascular complication will be avoided Outcome: Progressing   Problem: Activity: Goal: Risk for activity intolerance will decrease Outcome: Progressing   Problem: Nutrition: Goal: Adequate nutrition will be maintained Outcome: Progressing   Problem: Coping: Goal: Level of anxiety will decrease Outcome: Progressing   Problem: Elimination: Goal: Will not experience complications related to bowel motility Outcome: Progressing Goal: Will not experience complications related to urinary retention Outcome: Progressing    Problem: Pain Managment: Goal: General experience of comfort will improve Outcome: Progressing   Problem: Safety: Goal: Ability to remain free from injury will improve Outcome: Progressing   Problem: Skin Integrity: Goal: Risk for impaired skin integrity will decrease Outcome: Progressing   Problem: Education: Goal: Ability to describe self-care measures that may prevent or decrease complications (Diabetes Survival Skills Education) will improve Outcome: Progressing Goal: Individualized Educational Video(s) Outcome: Progressing   Problem: Coping: Goal: Ability to adjust to condition or change in health will improve Outcome: Progressing   Problem: Fluid Volume: Goal: Ability to maintain a balanced intake and output will improve Outcome: Progressing   Problem: Health Behavior/Discharge Planning: Goal: Ability to identify and utilize available resources and services will improve Outcome: Progressing Goal: Ability to manage health-related needs will improve Outcome: Progressing   Problem: Metabolic: Goal: Ability to maintain appropriate glucose levels will improve Outcome: Progressing   Problem: Nutritional: Goal: Maintenance of adequate nutrition will improve Outcome: Progressing Goal: Progress toward achieving an optimal weight will improve Outcome: Progressing   Problem: Skin Integrity: Goal: Risk for impaired skin integrity will decrease Outcome: Progressing   Problem: Tissue Perfusion: Goal: Adequacy of tissue perfusion will improve Outcome: Progressing   Problem: Education: Goal: Knowledge about tracheostomy care/management will improve Outcome: Progressing   Problem: Activity: Goal: Ability to tolerate increased activity will improve Outcome: Progressing   Problem: Health Behavior/Discharge Planning: Goal: Ability to manage tracheostomy will improve Outcome: Progressing   Problem: Respiratory: Goal: Patent airway maintenance will improve Outcome:  Progressing   Problem: Role Relationship: Goal: Ability to communicate will improve Outcome: Progressing

## 2022-06-12 ENCOUNTER — Inpatient Hospital Stay (HOSPITAL_COMMUNITY): Payer: 59

## 2022-06-12 DIAGNOSIS — J9621 Acute and chronic respiratory failure with hypoxia: Secondary | ICD-10-CM | POA: Diagnosis not present

## 2022-06-12 DIAGNOSIS — J9602 Acute respiratory failure with hypercapnia: Secondary | ICD-10-CM | POA: Diagnosis not present

## 2022-06-12 DIAGNOSIS — J441 Chronic obstructive pulmonary disease with (acute) exacerbation: Secondary | ICD-10-CM | POA: Diagnosis not present

## 2022-06-12 DIAGNOSIS — E440 Moderate protein-calorie malnutrition: Secondary | ICD-10-CM | POA: Diagnosis not present

## 2022-06-12 DIAGNOSIS — Z930 Tracheostomy status: Secondary | ICD-10-CM

## 2022-06-12 LAB — BASIC METABOLIC PANEL
Anion gap: 12 (ref 5–15)
BUN: 24 mg/dL — ABNORMAL HIGH (ref 8–23)
CO2: 36 mmol/L — ABNORMAL HIGH (ref 22–32)
Calcium: 8.9 mg/dL (ref 8.9–10.3)
Chloride: 94 mmol/L — ABNORMAL LOW (ref 98–111)
Creatinine, Ser: 0.45 mg/dL (ref 0.44–1.00)
GFR, Estimated: >60 mL/min (ref >60)
Glucose, Bld: 100 mg/dL — ABNORMAL HIGH (ref 70–99)
Potassium: 4.8 mmol/L (ref 3.5–5.1)
Sodium: 142 mmol/L (ref 135–145)

## 2022-06-12 LAB — GLUCOSE, CAPILLARY
Glucose-Capillary: 114 mg/dL — ABNORMAL HIGH (ref 70–99)
Glucose-Capillary: 118 mg/dL — ABNORMAL HIGH (ref 70–99)
Glucose-Capillary: 118 mg/dL — ABNORMAL HIGH (ref 70–99)
Glucose-Capillary: 143 mg/dL — ABNORMAL HIGH (ref 70–99)
Glucose-Capillary: 173 mg/dL — ABNORMAL HIGH (ref 70–99)
Glucose-Capillary: 229 mg/dL — ABNORMAL HIGH (ref 70–99)
Glucose-Capillary: 87 mg/dL (ref 70–99)

## 2022-06-12 MED ORDER — PHENOL 1.4 % MT LIQD
1.0000 | OROMUCOSAL | Status: DC | PRN
  Administered 2022-06-12: 1 via OROMUCOSAL
  Filled 2022-06-12: qty 177

## 2022-06-12 MED ORDER — METOPROLOL TARTRATE 25 MG PO TABS
25.0000 mg | ORAL_TABLET | Freq: Three times a day (TID) | ORAL | Status: DC
  Administered 2022-06-13 – 2022-06-14 (×6): 25 mg
  Filled 2022-06-12 (×7): qty 1

## 2022-06-12 MED ORDER — POLYETHYLENE GLYCOL 3350 17 G PO PACK: 17.0000 g | PACK | Freq: Every day | ORAL | Status: DC | PRN

## 2022-06-12 MED ORDER — METOPROLOL TARTRATE 25 MG PO TABS
25.0000 mg | ORAL_TABLET | Freq: Three times a day (TID) | ORAL | Status: DC
  Administered 2022-06-12 (×2): 25 mg via ORAL
  Filled 2022-06-12 (×2): qty 1

## 2022-06-12 MED ORDER — INSULIN GLARGINE-YFGN 100 UNIT/ML ~~LOC~~ SOLN
8.0000 [IU] | Freq: Every day | SUBCUTANEOUS | Status: DC
  Administered 2022-06-12 – 2022-06-16 (×5): 8 [IU] via SUBCUTANEOUS
  Filled 2022-06-12 (×6): qty 0.08

## 2022-06-12 NOTE — Progress Notes (Signed)
Speech Language Pathology Treatment: [name] Speaking valve  Patient Details Name: Abigail Castillo MRN: [id number] DOB: 1950-09-09 Today's Date: 06/12/2022 Time: 0920-0933 SLP Time Calculation (min) (ACUTE ONLY): 13 min  Assessment / Plan / Recommendation Clinical Impression  Pt was very drowsy today and did not stay awake while valve was placed without stimulation. Pt stated that she was feeling good today, but was tired. Pt's vitals looked better overall today, with SpO2 at 96% on TC before placing the valve. Cuff was deflated at baseline. Pt tolerated wearing the valve for 4 minutes today and produced limited verbal output due to drowsiness. Noted low vocal intensity and hoarseness with patient responding to mod verbal cues for loudness. Valve was removed when SpO2 reached 88% and remained stable when valve was off. No back pressure observed. Valve was left off due to pt's overall level of alertness. This likely affected her willingness to speak. Recommend continued trials of PMV use with full staff supervision when off the vent.    HPI HPI: Abigail Castillo is a 72 y.o. F recently admitted for COPD flare, discharged to SNF who returned 12/25 due to SpO2 65%. Found to be RSV+, pCO2 80, started on BiPAP. Head CT showing possible subacute infarct of the medial left cerebellar hemisphere. Initial bedside swallow eval 1/1 WFL. Failed bipap wean on 1/2, intubated. Tracheostomy 1/4. Pt with COPD and chronic respiratory failure on 2-3L home O2, depression/anxiety and hx BrCA      SLP Plan  Continue with current plan of care      Recommendations for follow up therapy are one component of a multi-disciplinary discharge planning process, led by the attending physician.  Recommendations may be updated based on patient status, additional functional criteria and insurance authorization.    Recommendations         Patient may use Passy-Muir Speech Valve: Intermittently with supervision PMSV Supervision:  Full         Oral Care Recommendations: Oral care QID Follow Up Recommendations: SLP at Long-term acute care hospital Assistance recommended at discharge: Frequent or constant Supervision/Assistance SLP Visit Diagnosis: Aphonia (R49.1) Plan: Continue with current plan of care          Fabio Asa., Student SLP  06/12/2022, 11:01 AM

## 2022-06-12 NOTE — Progress Notes (Signed)
Occupational Therapy Treatment Patient Details Name: Abigail Castillo MRN: [id number] DOB: June 03, 1950 Today's Date: 06/12/2022   History of present illness Mrs. Dorner is a 72 y.o. F recently admitted for COPD flare, discharged to SNF who returned due to SpO2 65%. Found to be  RSV+, pCO2 80, started on BiPAP.   Head CT showing possible subacute infarct of the medial left cerebellar hemisphere. Failed bipap wean on 1/2, intubated. Tracheostomy 1/4. Pt with COPD and chronic respiratory failure on 2-3L home O2, depression/anxiety and hx BrCA   OT comments  Pt seen on vent support. Pt demonstrating improved endurance, balance at EOB and strength. Able to partially stand with 2 person assist and pivot along EOB. Total assist for brushing hair thoroughly and placing in hair tie, mod for changing gown and total assist for pericare and LB dressing. Pt continues to work hard in therapy.   Recommendations for follow up therapy are one component of a multi-disciplinary discharge planning process, led by the attending physician.  Recommendations may be updated based on patient status, additional functional criteria and insurance authorization.    Follow Up Recommendations  OT at Long-term acute care hospital     Assistance Recommended at Discharge Frequent or constant Supervision/Assistance  Patient can return home with the following  Two people to help with walking and/or transfers;Two people to help with bathing/dressing/bathroom;Assistance with feeding;Direct supervision/assist for medications management;Assistance with cooking/housework;Direct supervision/assist for financial management;Assist for transportation;Help with stairs or ramp for entrance   Equipment Recommendations  Other (comment) (defer to next venue)    Recommendations for Other Services      Precautions / Restrictions Precautions Precautions: Fall Precaution Comments: trach, vent vs trach collar Restrictions Weight Bearing  Restrictions: No       Mobility Bed Mobility   Bed Mobility: Rolling, Sidelying to Sit, Sit to Supine Rolling: Min assist Sidelying to sit: Mod assist, +2 for safety/equipment, HOB elevated   Sit to supine: Max assist, +2 for physical assistance, +2 for safety/equipment   General bed mobility comments: Pt needing up to minA to complete trunk rotation to roll either direction multiple times for pericare. Good initiation by pt to bring legs off EOB and pull on rails and therapist to ascend trunk. MinA to ascend trunk but modA to scoot hips to EOB using bed pads. MaxAx2 to return to supine, cuing pt to lean onto L elbow to descend trunk.    Transfers Overall transfer level: Needs assistance Equipment used: 2 person hand held assist Transfers: Sit to/from Stand Sit to Stand: Mod assist, +2 physical assistance, +2 safety/equipment           General transfer comment: Pt needing modAx2 to come to stand 3x from EOB, displaying a flexed posture each rep. Provided bil knee block and used bed pad to extend hips. MaxAx2 to squat pivot to L along EOB to get to [facility].     Balance Overall balance assessment: Needs assistance Sitting-balance support: Feet supported, Bilateral upper extremity supported, Single extremity supported Sitting balance-Leahy Scale: Poor Sitting balance - Comments: Tends to rely on at least L UE support on bed to sit EOB, min guard for safety   Standing balance support: Bilateral upper extremity supported, During functional activity Standing balance-Leahy Scale: Poor Standing balance comment: ModAx2 and bil knee block with pt holding onto therapists to stand 3x for ~5-10 sec each rep. Increased flexion of trunk and legs as time in standing increased.  ADL either performed or assessed with clinical judgement   ADL Overall ADL's : Needs assistance/impaired     Grooming: Brushing hair;Sitting;Total assistance Grooming Details  (indicate cue type and reason): brushed out knots, placed in hair band         Upper Body Dressing : Moderate assistance;Bed level   Lower Body Dressing: Total assistance;Bed level Lower Body Dressing Details (indicate cue type and reason): mesh panties     Toileting- Clothing Manipulation and Hygiene: Total assistance;Bed level Toileting - Clothing Manipulation Details (indicate cue type and reason): unaware of bowel incontinence            Extremity/Trunk Assessment              Vision       Perception     Praxis      Cognition Arousal/Alertness: Awake/alert Behavior During Therapy: WFL for tasks assessed/performed Overall Cognitive Status: Difficult to assess                                 General Comments: Pt follows simple commands with slightly increased time. Needs cues intermittently for problem-solving bed mobility/transfers. Pt with trach on vent, impacting communication and full cog assessment.        Exercises      Shoulder Instructions       General Comments trach with full vent support 40% FiO2 PEEP 5; VSS    Pertinent Vitals/ Pain       Pain Assessment Pain Assessment: Faces Faces Pain Scale: Hurts a little bit Pain Location: grimacing with pericare and brushing hair Pain Descriptors / Indicators: Grimacing Pain Intervention(s): Monitored during session, Repositioned  Home Living                                          Prior Functioning/Environment              Frequency  Min 2X/week        Progress Toward Goals  OT Goals(current goals can now be found in the care plan section)  Progress towards OT goals: Progressing toward goals  Acute Rehab OT Goals OT Goal Formulation: With patient Time For Goal Achievement: 06/25/22 Potential to Achieve Goals: [place] Discharge plan remains appropriate    Co-evaluation    PT/OT/SLP Co-Evaluation/Treatment: Yes Reason for Co-Treatment:  Complexity of the patient's impairments (multi-system involvement) PT goals addressed during session: Mobility/safety with mobility;Balance;Strengthening/ROM OT goals addressed during session: Strengthening/ROM;ADL's and self-care      AM-PAC OT "6 Clicks" Daily Activity     Outcome Measure   Help from another person eating meals?: Total Help from another person taking care of personal grooming?: Total Help from another person toileting, which includes using toliet, bedpan, or urinal?: Total Help from another person bathing (including washing, rinsing, drying)?: A Lot Help from another person to put on and taking off regular upper body clothing?: A Lot Help from another person to put on and taking off regular lower body clothing?: Total 6 Click Score: 8    End of Session    OT Visit Diagnosis: Muscle weakness (generalized) (M62.81)   Activity Tolerance Patient tolerated treatment well   Patient Left in bed;with call bell/phone within reach   Nurse Communication Mobility status        Time: 4696-2952 OT Time Calculation (min):  44 min  Charges: OT General Charges $OT Visit: 1 Visit OT Treatments $Self Care/Home Management : 8-22 mins  Cleta Alberts, OTR/L Acute Rehabilitation Services Office: [phone]   [name] 06/12/2022, 1:58 PM

## 2022-06-12 NOTE — TOC Progression Note (Signed)
Transition of Care [facility]) - Progression Note    Patient Details  Name: Abigail Castillo MRN: [id number] Date of Birth: Oct 01, 1950  Transition of Care [facility]) CM/SW [place], [place] Phone Number: 06/12/2022, 4:14 PM  Clinical Narrative:     CSW following to fax out for Vent/SNF placement closer to patient being medically ready for dc. Patients passr under review. CSW submitted clinicals to [place] must for review. CSW will continue to follow and assist with patients dc planning needs.  Expected Discharge Plan: [place] (Vent/SNF) Barriers to Discharge: Continued Medical Work up  Expected Discharge Plan and Services In-house Referral: Clinical Social Work   Post Acute Care Choice: Long Term Acute Care (LTAC) Living arrangements for the past 2 months:  (PTA came from [place] short term before that was from home alone)                                       Social Determinants of Health (SDOH) Interventions [place]: No Food Insecurity (05/30/2022)  Housing: Low Risk  (05/30/2022)  Transportation Needs: No Transportation Needs (05/30/2022)  Utilities: Not At Risk (05/30/2022)  Alcohol Screen: Low Risk  (03/26/2021)  Depression (PHQ2-9): Low Risk  (03/26/2021)  Financial Resource Strain: Low Risk  (03/26/2021)  Physical Activity: Inactive (03/26/2021)  Social Connections: Moderately Isolated (03/26/2021)  Stress: No Stress Concern Present (03/26/2021)  Tobacco Use: Medium Risk (05/19/2022)    Readmission Risk Interventions     No data to display

## 2022-06-12 NOTE — Progress Notes (Signed)
Physical Therapy Treatment Patient Details Name: Abigail Castillo MRN: [id number] DOB: 12/19/1950 Today's Date: 06/12/2022   History of Present Illness Mrs. Lockamy is a 72 y.o. F recently admitted for COPD flare, discharged to SNF who returned due to SpO2 65%. Found to be  RSV+, pCO2 80, started on BiPAP.   Head CT showing possible subacute infarct of the medial left cerebellar hemisphere. Failed bipap wean on 1/2, intubated. Tracheostomy 1/4. Pt with COPD and chronic respiratory failure on 2-3L home O2, depression/anxiety and hx BrCA    PT Comments    Pt was placed back on vent full support prior to therapy session as she was beginning to desat on trach collar again. Her vitals remained stable throughout this session and she was able to tolerate increased activity today. Pt was able to transition sidelying > sit L EOB with modA and stand 3x for ~5-10 sec durations with bil knees blocked, bil UE support, and modAx2. She required maxAx2 to pivot along EOB. Pt would benefit from progressing to getting OOB to the recliner in future session. Noted pt tends to rest with her ankles plantarflexed and pt declined use of the prevalon boots to better position her ankles end of session, thus had pt complete AROM of ankles and positioned her with ankles at about neutral by placing pillows at the base of the bed under her feet. Will continue to follow acutely. Current recommendations remain appropriate.     Recommendations for follow up therapy are one component of a multi-disciplinary discharge planning process, led by the attending physician.  Recommendations may be updated based on patient status, additional functional criteria and insurance authorization.  Follow Up Recommendations  PT at Long-term acute care hospital Can patient physically be transported by private vehicle: No   Assistance Recommended at Discharge Frequent or constant Supervision/Assistance  Patient can return home with the following A  lot of help with bathing/dressing/bathroom;Two people to help with walking and/or transfers   [facility] bed;Wheelchair (measurements PT);Wheelchair cushion (measurements PT);Other (comment) (hoyer lift)    Recommendations for Other Services       Precautions / Restrictions Precautions Precautions: Fall Precaution Comments: trach, vent vs trach collar Restrictions Weight Bearing Restrictions: No     Mobility  Bed Mobility Overal bed mobility: Needs Assistance Bed Mobility: Rolling, Sidelying to Sit, Sit to Supine Rolling: Min assist Sidelying to sit: Mod assist, +2 for safety/equipment, HOB elevated   Sit to supine: Max assist, +2 for physical assistance, +2 for safety/equipment   General bed mobility comments: Pt needing up to minA to complete trunk rotation to roll either direction multiple times for pericare. Good initiation by pt to bring legs off EOB and pull on rails and therapist to ascend trunk. MinA to ascend trunk but modA to scoot hips to EOB using bed pads. MaxAx2 to return to supine, cuing pt to lean onto L elbow to descend trunk.    Transfers Overall transfer level: Needs assistance Equipment used: 2 person hand held assist Transfers: Sit to/from Stand Sit to Stand: Mod assist, +2 physical assistance, +2 safety/equipment     Squat pivot transfers: Max assist, +2 physical assistance, +2 safety/equipment     General transfer comment: Pt needing modAx2 to come to stand 3x from EOB, displaying a flexed posture each rep. Provided bil knee block and used bed pad to extend hips. MaxAx2 to squat pivot to L along EOB to get to [facility].    Ambulation/Gait  General Gait Details: deferred   Stairs             Wheelchair Mobility    Modified Rankin (Stroke Patients Only)       Balance Overall balance assessment: Needs assistance Sitting-balance support: Feet supported, Bilateral upper extremity supported, Single  extremity supported Sitting balance-Leahy Scale: Poor Sitting balance - Comments: Tends to rely on at least L UE support on bed to sit EOB, min guard for safety   Standing balance support: Bilateral upper extremity supported, During functional activity Standing balance-Leahy Scale: Poor Standing balance comment: ModAx2 and bil knee block with pt holding onto therapists to stand 3x for ~5-10 sec each rep. Increased flexion of trunk and legs as time in standing increased.                            Cognition Arousal/Alertness: Awake/alert Behavior During Therapy: WFL for tasks assessed/performed Overall Cognitive Status: Difficult to assess                                 General Comments: Pt follows simple commands with slightly increased time. Needs cues intermittently for problem-solving bed mobility/transfers. Pt with trach on vent, impacting communication and full cog assessment.        Exercises General Exercises - Lower Extremity Ankle Circles/Pumps: Both, 10 reps, Supine, AROM Other Exercises Other Exercises: pillows placed at plantar aspects of bil feet with pt supine in bed to position ankles in more dorsiflexion (pt declined using prevalon boots), pt agreeable to be left in position and instructed to call for nursing when she needs a break and RN made aware of position    General Comments General comments (skin integrity, edema, etc.): trach with full vent support 40% FiO2 PEEP 5; VSS      Pertinent Vitals/Pain Pain Assessment Pain Assessment: Faces Faces Pain Scale: Hurts a little bit Pain Location: grimacing with pericare and brushing hair Pain Descriptors / Indicators: Grimacing Pain Intervention(s): Limited activity within patient's tolerance, Monitored during session    Home Living                          Prior Function            PT Goals (current goals can now be found in the care plan section) Acute Rehab PT  Goals Patient Stated Goal: agreeable to session PT Goal Formulation: With patient Time For Goal Achievement: 06/23/22 Potential to Achieve Goals: Fair Progress towards PT goals: Progressing toward goals    Frequency    Min 2X/week      PT Plan Current plan remains appropriate    Co-evaluation PT/OT/SLP Co-Evaluation/Treatment: Yes Reason for Co-Treatment: For patient/therapist safety;To address functional/ADL transfers PT goals addressed during session: Mobility/safety with mobility;Balance;Strengthening/ROM        AM-PAC PT "6 Clicks" Mobility   Outcome Measure  Help needed turning from your back to your side while in a flat bed without using bedrails?: A Little Help needed moving from lying on your back to sitting on the side of a flat bed without using bedrails?: A Lot Help needed moving to and from a bed to a chair (including a wheelchair)?: Total Help needed standing up from a chair using your arms (e.g., wheelchair or bedside chair)?: Total Help needed to walk in hospital room?: Total Help needed climbing 3-5 steps  with a railing? : Total 6 Click Score: 9    End of Session Equipment Utilized During Treatment: Oxygen Activity Tolerance: Patient tolerated treatment well Patient left: in bed;with call bell/phone within reach;with bed alarm set Nurse Communication: Mobility status;Other (comment) (positioning of ankles) PT Visit Diagnosis: Difficulty in walking, not elsewhere classified (R26.2);Unsteadiness on feet (R26.81);Other abnormalities of gait and mobility (R26.89);Muscle weakness (generalized) (M62.81)     Time: 8638-1771 PT Time Calculation (min) (ACUTE ONLY): 48 min  Charges:  $Therapeutic Exercise: 8-22 mins $Therapeutic Activity: 8-22 mins                     Moishe Spice, PT, DPT Acute Rehabilitation Services  Office: [phone]    Orvan Falconer 06/12/2022, 1:25 PM

## 2022-06-12 NOTE — NC FL2 (Addendum)
[place] LEVEL OF CARE FORM     IDENTIFICATION  Patient Name: Abigail Castillo Birthdate: 1951-01-01 Sex: female Admission Date (Current Location): 05/19/2022  [facility] and [place] Number:  Herbalist and Address:  [facility] [place], [place] [address] [place], [place] [address]      Provider Number: [id number]  Attending Physician Name and Address:  Jacky Kindle, MD  Relative Name and Phone Number:  Duwaine Maxin (Daughter) [phone]    Current Level of Care: Hospital Recommended Level of Care: Trach SNF Prior Approval Number:    Date Approved/Denied:   PASRR Number: [id number]  Discharge Plan: Trach/SNF    Current Diagnoses: Patient Active Problem List   Diagnosis Date Noted   Tracheostomy in place [facility]) 06/08/2022   Acute respiratory failure with hypercapnia ([place]) 05/28/2022   Pressure injury of skin 05/28/2022   Malnutrition of moderate degree 05/20/2022   COPD with acute exacerbation ([place]) 05/19/2022   RSV (respiratory syncytial virus pneumonia) 05/19/2022   Essential hypertension 05/19/2022   Tracheobronchomalacia 04/27/2022   Acute on chronic respiratory failure with hypoxia and hypercapnia (HCC) 04/27/2022   Bronchomalacia 04/25/2022   Localized edema 04/25/2022   COPD exacerbation ([place]) 04/25/2022   Hyponatremia 04/10/2022   On home oxygen therapy 12/24/2021   Ductal carcinoma in situ (DCIS) of left breast 06/03/2021   Hepatic steatosis 05/13/2021   Elevated blood pressure reading without diagnosis of hypertension 09/14/2020   Acute on chronic respiratory failure with hypoxia ([place]) 07/05/2019   Chronic respiratory failure ([place]) 08/25/2017   Allergic rhinitis 06/16/2017   Schizoaffective disorder ([place]) 06/30/2016   GERD (gastroesophageal reflux disease) 11/02/2015   Esophageal stricture 03/14/2015   Mild hyperlipidemia 02/03/2014   Vitamin D deficiency 02/03/2014   Encounter for routine gynecological examination  06/14/2013   Personal history of colonic polyps 03/10/2012   B12 deficiency 11/11/2010   Memory change 11/04/2010   HYPERTRIGLYCERIDEMIA 06/14/2010   Depression 05/31/2009   History of tobacco abuse 03/02/2008   Osteoporosis 04/05/2007   COPD (chronic obstructive pulmonary disease) ([place]) 03/24/2007   HOARSENESS, CHRONIC 03/24/2007    Orientation RESPIRATION BLADDER Height & Weight      (Intubated/Trach)  Vent, Tracheostomy (Vent 40%,Trach [place] 6 mm Cuffed) Continent, External catheter (External Urinary Catheter) Weight: 136 lb 7.4 oz (61.9 kg) Height:  '5\' 2"'$  (157.5 cm)  BEHAVIORAL SYMPTOMS/MOOD NEUROLOGICAL BOWEL NUTRITION STATUS      Incontinent Feeding tube (Peg Tube)  AMBULATORY STATUS COMMUNICATION OF NEEDS Skin      (Intubated/Trach)                         Personal Care Assistance Level of Assistance    Bathing Assistance: Maximum assistance Feeding assistance: Maximum assistance Dressing Assistance: Maximum assistance     Functional Limitations Info  Sight, Hearing, Speech Sight Info: Impaired Hearing Info: Impaired Speech Info:  (Intubated/trach)    SPECIAL CARE FACTORS FREQUENCY                       Contractures Contractures Info: Not present    Additional Factors Info  Code Status, Allergies, Psychotropic, Insulin Sliding Scale Code Status Info: FULL Allergies Info: Sulfonamide Derivatives Psychotropic Info: busPIRone (BUSPAR) tablet 5 mg 3 times daily,clonazePAM (KLONOPIN) tablet 1 mg daily at bedtime,OLANZapine [facility]) tablet 10 mg every morning Insulin Sliding Scale Info: insulin aspart (novoLOG) injection 0-6 Units every 4 hours,insulin glargine-yfgn (SEMGLEE) injection 10 Units daily  Current Medications (06/12/2022):  This is the current hospital active medication list Current Facility-Administered Medications  Medication Dose Route Frequency Provider Last Rate Last Admin   0.9 %  sodium chloride infusion   Intravenous PRN  Minor, Grace Bushy, NP   Stopped at 05/29/22 1117   acetaminophen (TYLENOL) 160 MG/5ML solution 650 mg  650 mg Per Tube Q6H PRN Kipp Brood, MD       acetaminophen (TYLENOL) tablet 650 mg  650 mg Oral Q6H PRN Minor, Grace Bushy, NP   650 mg at 06/06/22 0865   Or   acetaminophen (TYLENOL) suppository 650 mg  650 mg Rectal Q6H PRN Minor, Grace Bushy, NP       anastrozole (ARIMIDEX) tablet 1 mg  1 mg Per Tube q AM [name], NP   1 mg at 06/12/22 1456   arformoterol (BROVANA) nebulizer solution 15 mcg  15 mcg Nebulization BID Minor, Grace Bushy, NP   15 mcg at 06/12/22 0741   atorvastatin (LIPITOR) tablet 40 mg  40 mg Per Tube Daily Candee Furbish, MD   40 mg at 06/12/22 1455   bisacodyl (DULCOLAX) suppository 10 mg  10 mg Rectal Daily PRN Jacky Kindle, MD   10 mg at 05/31/22 0806   budesonide (PULMICORT) nebulizer solution 0.5 mg  0.5 mg Nebulization BID Candee Furbish, MD   0.5 mg at 06/12/22 7846   busPIRone (BUSPAR) tablet 5 mg  5 mg Per Tube TID Candee Furbish, MD   5 mg at 06/11/22 2241   Chlorhexidine Gluconate Cloth 2 % PADS 6 each  6 each Topical Daily Candee Furbish, MD   6 each at 06/12/22 1000   clonazePAM (KLONOPIN) disintegrating tablet 1 mg  1 mg Per Tube BID PRN Candee Furbish, MD   1 mg at 06/09/22 0021   clonazePAM (KLONOPIN) tablet 1 mg  1 mg Per Tube QHS Candee Furbish, MD   1 mg at 06/11/22 2241   enoxaparin (LOVENOX) injection 40 mg  40 mg Subcutaneous Q24H Jacky Kindle, MD   40 mg at 06/11/22 2241   famotidine (PEPCID) 40 MG/5ML suspension 20 mg  20 mg Per Tube Daily [name], NP   20 mg at 06/12/22 1456   feeding supplement (OSMOLITE 1.5 CAL) liquid 1,000 mL  1,000 mL Per Tube Continuous Jacky Kindle, MD   Stopped at 06/12/22 0620   feeding supplement (PROSource TF20) liquid 60 mL  60 mL Per Tube Daily Jacky Kindle, MD   60 mL at 06/12/22 1452   fiber supplement (BANATROL TF) liquid 60 mL  60 mL Per Tube BID Agarwala, Einar Grad, MD   60 mL at 06/12/22 1451    free water 200 mL  200 mL Per Tube Q4H Salvadore Dom E, NP   200 mL at 06/12/22 0400   hydrALAZINE (APRESOLINE) injection 5 mg  5 mg Intravenous Q4H PRN Minor, Grace Bushy, NP   5 mg at 05/31/22 1222   insulin aspart (novoLOG) injection 0-6 Units  0-6 Units Subcutaneous Q4H Anders Simmonds, MD   1 Units at 06/11/22 2008   insulin glargine-yfgn [facility]) injection 8 Units  8 Units Subcutaneous Daily Jacky Kindle, MD   8 Units at 06/12/22 1347   levalbuterol (XOPENEX) nebulizer solution 0.63 mg  0.63 mg Nebulization Q3H PRN Anders Simmonds, MD   0.63 mg at 06/07/22 0522   levalbuterol (XOPENEX) nebulizer solution 0.63 mg  0.63 mg Nebulization Q6H Candee Furbish, MD  0.63 mg at 06/12/22 1352   lip balm (CARMEX) ointment   Topical PRN Candee Furbish, MD       loratadine (CLARITIN) tablet 10 mg  10 mg Per Tube Daily Candee Furbish, MD   10 mg at 06/12/22 1500   metoprolol tartrate (LOPRESSOR) tablet 25 mg  25 mg Oral TID Jacky Kindle, MD       multivitamin with minerals tablet 1 tablet  1 tablet Per Tube Daily [name], NP   1 tablet at 06/12/22 1455   OLANZapine (ZYPREXA) tablet 10 mg  10 mg Per Tube q AM Jacky Kindle, MD   10 mg at 06/12/22 1459   ondansetron (ZOFRAN) tablet 4 mg  4 mg Oral Q6H PRN Minor, Grace Bushy, NP       Or   ondansetron (ZOFRAN) injection 4 mg  4 mg Intravenous Q6H PRN Minor, Grace Bushy, NP       Oral care mouth rinse  15 mL Mouth Rinse Q2H Candee Furbish, MD   15 mL at 06/12/22 1400   Oral care mouth rinse  15 mL Mouth Rinse PRN Candee Furbish, MD       polyethylene glycol (MIRALAX / GLYCOLAX) packet 17 g  17 g Oral Daily PRN Minor, Grace Bushy, NP       predniSONE (DELTASONE) tablet 40 mg  40 mg Per Tube Q breakfast [name], NP   40 mg at 06/12/22 1453   sodium chloride flush (NS) 0.9 % injection 3 mL  3 mL Intravenous Q12H Minor, Grace Bushy, NP   3 mL at 06/12/22 1040   torsemide (DEMADEX) tablet 10 mg  10 mg Per Tube Daily Kipp Brood, MD   10 mg at  06/12/22 1455   white petrolatum (VASELINE) gel   Topical PRN Candee Furbish, MD         Discharge Medications: Please see discharge summary for a list of discharge medications.  Relevant Imaging Results:  Relevant Lab Results:   Additional Information [id number]  Milas Gain, LCSWA

## 2022-06-12 NOTE — Progress Notes (Signed)
New NG tube placed via right nare. Patient tolerated well. X-ray ordered for placement confirmation.

## 2022-06-12 NOTE — Progress Notes (Signed)
NAME:  Abigail Castillo, MRN:  [id number], DOB:  [birth year], LOS: 24 ADMISSION DATE:  05/19/2022, CONSULTATION DATE:  05/20/22 REFERRING MD:  [facility] CHIEF COMPLAINT:  Dyspnea   History of Present Illness:  Abigail Castillo is a 72 y.o. female who has a PMH as below including but not limited to COPD on chronic 2-3L O2 and '10mg'$  Prednisone, OSA on nocturnal BiPAP, and who is followed by Dr. Lamonte Sakai in our office. She had recent admission 11/15 through 12/18 for AECOPD and was then discharged to [facility].  She was recently seen as an outpatient by Dr. Lamonte Sakai on 12/22 and was prescribed a slow prednisone taper with instructions to drop to '10mg'$  and continue until her next follow up appointment.  She presented back to [facility] ED 12/25 with dyspnea and hypoxia down to 65%. She was found to have hypoxic and hypercapnic respiratory failure and her RSV swab returned positive. She was placed on BiPAP and was admitted by [facility].  On 12/26, PCCM was consulted for assistance with BiPAP and COPD management.  Pertinent  Medical History:  has History of tobacco abuse; Depression; COPD (chronic obstructive pulmonary disease) ([place]); Osteoporosis; HOARSENESS, CHRONIC; HYPERTRIGLYCERIDEMIA; Memory change; B12 deficiency; Personal history of colonic polyps; Encounter for routine gynecological examination; Mild hyperlipidemia; Vitamin D deficiency; Esophageal stricture; GERD (gastroesophageal reflux disease); Schizoaffective disorder ([place]); Allergic rhinitis; Chronic respiratory failure ([place]); Acute on chronic respiratory failure with hypoxia ([place]); Elevated blood pressure reading without diagnosis of hypertension; Hepatic steatosis; Ductal carcinoma in situ (DCIS) of left breast; On home oxygen therapy; Hyponatremia; Bronchomalacia; Localized edema; COPD exacerbation ([place]); Tracheobronchomalacia; Acute on chronic respiratory failure with hypoxia and hypercapnia (HCC); COPD with acute exacerbation ([place]); RSV (respiratory syncytial  virus pneumonia); Essential hypertension; Malnutrition of moderate degree; Acute respiratory failure with hypercapnia ([place]); Pressure injury of skin; and Tracheostomy in place [facility]) on their problem list.  Significant Hospital Events: Including procedures, antibiotic start and stop dates in addition to other pertinent events   12/25 admit. 12/26 PCCM consult. 1/2 Failed BiPAP wean, Intubated 1/3 CT Head negative was completed for observed pupillary change 1/4 tracheostomy 1/8 CT head for anisocoria; neg,  1/11 continuing progressive trach collar trials 1/12 BID trach collar trials, appears comfortable  1/14 failed PSV 1/15 pulse prednisone at 40 mg continues to fail SBT trials LTAC denied awaiting vent SNF 1/16 tolerated 1 hr atc. ASA placed on hold at request of IR for PEG.   Interim History / Subjective:  Tolerated trach collar for 1 hour yesterday Today she is tolerating trach collar so far >2h Denies redness of breath and cough  Objective:  Blood pressure 136/76, pulse 92, temperature 98.3 F (36.8 C), temperature source Oral, resp. rate 20, height '5\' 2"'$  (1.575 m), weight 61.9 kg, SpO2 99 %.    Vent Mode: PRVC FiO2 (%):  [35 %-40 %] 40 % Set Rate:  [18 bmp] 18 bmp Vt Set:  [400 mL] 400 mL PEEP:  [5 cmH20] 5 cmH20 Plateau Pressure:  [15 cmH20-19 cmH20] 17 cmH20   Intake/Output Summary (Last 24 hours) at 06/12/2022 0846 Last data filed at 06/11/2022 2041 Gross per 24 hour  Intake 1215 ml  Output 1210 ml  Net 5 ml   Filed Weights   06/10/22 0500 06/11/22 0500 06/12/22 0434  Weight: 61.1 kg 60.2 kg 61.9 kg   Physical Examination: Physical exam: General: Chronically ill-appearing female, lying on the bed.  S/p trach HEENT: [place]/AT, eyes anicteric.  moist mucus membranes. Cortrak in place Neuro:  Alert, awake following commands, moving all 4 extremities Chest: Reduced air entry at the bases bilaterally, faint wheezes noted more on right than left Heart: Tachycardiac, regular  rhythm, no murmurs or gallops Abdomen: Soft, nontender, nondistended, bowel sounds present Skin: No rash  Assessment & Plan:  Acute on chronic respiratory failure with hypoxia/hypercarbia s/p trach Acute COPD exacerbation in the setting of RSV pneumonia Patient with severe COPD at baseline, presented with RSV pneumonia, superimposed bacterial pneumonia Now she is ventilator dependent most of the time Tolerated trach collar for only 1 hour yesterday Currently on trach collar for more than 2 hours, will closely monitor for respiratory distress Continue prednisone 40 mg daily, with slow taper every 3 days x 10 mg continue brovana, yupelri and pulmicort  Hypernatremia due to diuretic therapy Serum sodium has corrected now at 142  Chronic anxiety/depression Continue clonidine, buspar, zyprexa   GERD Continue Pepcid  Breast ca Continue anastrozole  Acute/subacute L cerebellar and L frontal stroke Holding aspirin until after PEG tube per interventional radiology request  Diabetes type 2 Blood sugars are better controlled Continue Semglee and sliding scale insulin with CBG goal 140-180  Moderate malnutrition Continue dietary supplements   This patient is critically ill with multiple organ system failure which requires frequent high complexity decision making, assessment, support, evaluation, and titration of therapies. This was completed through the application of advanced monitoring technologies and extensive interpretation of multiple databases.  During this encounter critical care time was devoted to patient care services described in this note for 32 minutes.    Jacky Kindle, MD  Pulmonary Critical Care See Amion for pager If no response to pager, please call [phone] until 7pm After 7pm, Please call E-link [phone]

## 2022-06-12 NOTE — Progress Notes (Addendum)
RE: Abigail Castillo. Scot Dock  Date of Birth: 1951/05/21  Date: 06/12/2022  To Whom It May Concern:  Please be advised that the above-named patient will require a short-term nursing home stay - anticipated 30 days or less for rehabilitation and strengthening. The plan is for return home.

## 2022-06-13 DIAGNOSIS — E440 Moderate protein-calorie malnutrition: Secondary | ICD-10-CM | POA: Diagnosis not present

## 2022-06-13 DIAGNOSIS — J9602 Acute respiratory failure with hypercapnia: Secondary | ICD-10-CM | POA: Diagnosis not present

## 2022-06-13 DIAGNOSIS — J121 Respiratory syncytial virus pneumonia: Secondary | ICD-10-CM | POA: Diagnosis not present

## 2022-06-13 DIAGNOSIS — J9621 Acute and chronic respiratory failure with hypoxia: Secondary | ICD-10-CM | POA: Diagnosis not present

## 2022-06-13 LAB — GLUCOSE, CAPILLARY
Glucose-Capillary: 143 mg/dL — ABNORMAL HIGH (ref 70–99)
Glucose-Capillary: 143 mg/dL — ABNORMAL HIGH (ref 70–99)
Glucose-Capillary: 164 mg/dL — ABNORMAL HIGH (ref 70–99)
Glucose-Capillary: 177 mg/dL — ABNORMAL HIGH (ref 70–99)
Glucose-Capillary: 215 mg/dL — ABNORMAL HIGH (ref 70–99)
Glucose-Capillary: 71 mg/dL (ref 70–99)

## 2022-06-13 MED ORDER — PREDNISONE 20 MG PO TABS
30.0000 mg | ORAL_TABLET | Freq: Every day | ORAL | Status: DC
  Administered 2022-06-14 – 2022-06-15 (×2): 30 mg
  Filled 2022-06-13 (×2): qty 2

## 2022-06-13 MED ORDER — CEFAZOLIN SODIUM-DEXTROSE 2-4 GM/100ML-% IV SOLN
2.0000 g | INTRAVENOUS | Status: AC
  Administered 2022-06-16: 2 g via INTRAVENOUS
  Filled 2022-06-13: qty 100

## 2022-06-13 NOTE — TOC Progression Note (Signed)
Transition of Care [facility]) - Progression Note    Patient Details  Name: Abigail Castillo MRN: [id number] Date of Birth: 05/08/1951  Transition of Care [facility]) CM/SW [place], [place] Phone Number: 06/13/2022, 3:18 PM  Clinical Narrative:     CSW following to fax out for Vent/SNF placement closer to patient being medically ready for dc.  Patients passr was approved [id number] and added to patients FL2. CSW will continue to follow and assist with patients dc planning needs.  Expected Discharge Plan: [place] (Vent/SNF) Barriers to Discharge: Continued Medical Work up  Expected Discharge Plan and Services In-house Referral: Clinical Social Work   Post Acute Care Choice: Long Term Acute Care (LTAC) Living arrangements for the past 2 months:  (PTA came from [place] short term before that was from home alone)                                       Social Determinants of Health (SDOH) Interventions [place]: No Food Insecurity (05/30/2022)  Housing: Low Risk  (05/30/2022)  Transportation Needs: No Transportation Needs (05/30/2022)  Utilities: Not At Risk (05/30/2022)  Alcohol Screen: Low Risk  (03/26/2021)  Depression (PHQ2-9): Low Risk  (03/26/2021)  Financial Resource Strain: Low Risk  (03/26/2021)  Physical Activity: Inactive (03/26/2021)  Social Connections: Moderately Isolated (03/26/2021)  Stress: No Stress Concern Present (03/26/2021)  Tobacco Use: Medium Risk (05/19/2022)    Readmission Risk Interventions     No data to display

## 2022-06-13 NOTE — Progress Notes (Signed)
NAME:  Abigail Castillo, MRN:  [id number], DOB:  [birth year], LOS: 2 ADMISSION DATE:  05/19/2022, CONSULTATION DATE:  05/20/22 REFERRING MD:  Roger Shelter CHIEF COMPLAINT:  Dyspnea   History of Present Illness:  Abigail Castillo is a 72 y.o. female who has a PMH as below including but not limited to COPD on chronic 2-3L O2 and '10mg'$  Prednisone, OSA on nocturnal BiPAP, and who is followed by Dr. Lamonte Sakai in our office. She had recent admission 11/15 through 12/18 for AECOPD and was then discharged to [facility].  She was recently seen as an outpatient by Dr. Lamonte Sakai on 12/22 and was prescribed a slow prednisone taper with instructions to drop to '10mg'$  and continue until her next follow up appointment.  She presented back to [facility] ED 12/25 with dyspnea and hypoxia down to 65%. She was found to have hypoxic and hypercapnic respiratory failure and her RSV swab returned positive. She was placed on BiPAP and was admitted by [facility].  On 12/26, PCCM was consulted for assistance with BiPAP and COPD management.  Pertinent  Medical History:  has History of tobacco abuse; Depression; COPD (chronic obstructive pulmonary disease) ([place]); Osteoporosis; HOARSENESS, CHRONIC; HYPERTRIGLYCERIDEMIA; Memory change; B12 deficiency; Personal history of colonic polyps; Encounter for routine gynecological examination; Mild hyperlipidemia; Vitamin D deficiency; Esophageal stricture; GERD (gastroesophageal reflux disease); Schizoaffective disorder ([place]); Allergic rhinitis; Chronic respiratory failure ([place]); Acute on chronic respiratory failure with hypoxia ([place]); Elevated blood pressure reading without diagnosis of hypertension; Hepatic steatosis; Ductal carcinoma in situ (DCIS) of left breast; On home oxygen therapy; Hyponatremia; Bronchomalacia; Localized edema; COPD exacerbation ([place]); Tracheobronchomalacia; Acute on chronic respiratory failure with hypoxia and hypercapnia (HCC); COPD with acute exacerbation ([place]); RSV (respiratory syncytial  virus pneumonia); Essential hypertension; Malnutrition of moderate degree; Acute respiratory failure with hypercapnia ([place]); Pressure injury of skin; and Tracheostomy in place [facility]) on their problem list.  Significant Hospital Events: Including procedures, antibiotic start and stop dates in addition to other pertinent events   12/25 admit. 12/26 PCCM consult. 1/2 Failed BiPAP wean, Intubated 1/3 CT Head negative was completed for observed pupillary change 1/4 tracheostomy 1/8 CT head for anisocoria; neg,  1/11 continuing progressive trach collar trials 1/12 BID trach collar trials, appears comfortable  1/14 failed PSV 1/15 pulse prednisone at 40 mg continues to fail SBT trials LTAC denied awaiting vent SNF 1/16 tolerated 1 hr atc. ASA placed on hold at request of IR for PEG.   Interim History / Subjective:  Tolerated trach collar for 3 hour yesterday Now she is back on trach collar for more than 4 hours Denies shortness of breath and cough  Objective:  Blood pressure 111/68, pulse 89, temperature 98.2 F (36.8 C), temperature source Oral, resp. rate (!) 23, height '5\' 2"'$  (1.575 m), weight 60.9 kg, SpO2 94 %.    Vent Mode: PRVC FiO2 (%):  [40 %] 40 % Set Rate:  [18 bmp] 18 bmp Vt Set:  [400 mL] 400 mL PEEP:  [5 cmH20] 5 cmH20 Plateau Pressure:  [11 cmH20-16 cmH20] 16 cmH20   Intake/Output Summary (Last 24 hours) at 06/13/2022 1247 Last data filed at 06/13/2022 0200 Gross per 24 hour  Intake 320 ml  Output 1325 ml  Net -1005 ml   Filed Weights   06/11/22 0500 06/12/22 0434 06/13/22 0500  Weight: 60.2 kg 61.9 kg 60.9 kg   Physical Examination: Physical exam: General: Chronically ill-appearing female, lying on the bed HEENT: [place]/AT, eyes anicteric.  moist mucus membranes Neuro: Alert, awake following  commands Chest: Bilateral faint wheezes all over heart: Regular rate and rhythm, no murmurs or gallops Abdomen: Soft, nontender, nondistended, bowel sounds present Skin: No  rash  Assessment & Plan:  Acute on chronic respiratory failure with hypoxia/hypercarbia s/p trach Acute COPD exacerbation in the setting of RSV pneumonia Patient with severe COPD at baseline, presented with RSV pneumonia, superimposed bacterial pneumonia Now she is ventilator dependent most of the time Tolerates trach collar for 2 to 3 hours every day Currently on trach collar for more than 4 hours, continue as long as patient can tolerate Decrease prednisone to 30 mg daily for 3 days before decreasing further Continue nebs continue brovana, yupelri and pulmicort  Hypernatremia due to diuretic therapy, resolved Serum sodium has corrected now at 142  Chronic anxiety/depression Continue clonidine, buspar, zyprexa   GERD Continue Pepcid  Breast ca Continue anastrozole  Acute/subacute L cerebellar and L frontal stroke Holding aspirin until after PEG tube per interventional radiology request  Diabetes type 2 Blood sugars are better controlled Continue Semglee and sliding scale insulin with CBG goal 140-180  Moderate malnutrition Continue dietary supplements   Awaiting PEG tube placement then she will go to SNF    Jacky Kindle, MD [place] [facility] See Amion for pager If no response to pager, please call [phone] until 7pm After 7pm, Please call E-link [phone]

## 2022-06-14 ENCOUNTER — Inpatient Hospital Stay (HOSPITAL_COMMUNITY): Payer: 59

## 2022-06-14 DIAGNOSIS — J441 Chronic obstructive pulmonary disease with (acute) exacerbation: Secondary | ICD-10-CM | POA: Diagnosis not present

## 2022-06-14 DIAGNOSIS — J9621 Acute and chronic respiratory failure with hypoxia: Secondary | ICD-10-CM | POA: Diagnosis not present

## 2022-06-14 DIAGNOSIS — J9602 Acute respiratory failure with hypercapnia: Secondary | ICD-10-CM | POA: Diagnosis not present

## 2022-06-14 DIAGNOSIS — E440 Moderate protein-calorie malnutrition: Secondary | ICD-10-CM | POA: Diagnosis not present

## 2022-06-14 LAB — GLUCOSE, CAPILLARY
Glucose-Capillary: 140 mg/dL — ABNORMAL HIGH (ref 70–99)
Glucose-Capillary: 152 mg/dL — ABNORMAL HIGH (ref 70–99)
Glucose-Capillary: 153 mg/dL — ABNORMAL HIGH (ref 70–99)
Glucose-Capillary: 169 mg/dL — ABNORMAL HIGH (ref 70–99)
Glucose-Capillary: 206 mg/dL — ABNORMAL HIGH (ref 70–99)
Glucose-Capillary: 84 mg/dL (ref 70–99)

## 2022-06-14 LAB — CBC
HCT: 33.5 % — ABNORMAL LOW (ref 36.0–46.0)
Hemoglobin: 10.5 g/dL — ABNORMAL LOW (ref 12.0–15.0)
MCH: 31.3 pg (ref 26.0–34.0)
MCHC: 31.3 g/dL (ref 30.0–36.0)
MCV: 99.7 fL (ref 80.0–100.0)
Platelets: 251 10*3/uL (ref 150–400)
RBC: 3.36 MIL/uL — ABNORMAL LOW (ref 3.87–5.11)
RDW: 15.9 % — ABNORMAL HIGH (ref 11.5–15.5)
WBC: 7.4 10*3/uL (ref 4.0–10.5)
nRBC: 0.3 % — ABNORMAL HIGH (ref 0.0–0.2)

## 2022-06-14 NOTE — Progress Notes (Signed)
NAME:  Abigail Castillo, MRN:  [id number], DOB:  [birth year], LOS: 70 ADMISSION DATE:  05/19/2022, CONSULTATION DATE:  05/20/22 REFERRING MD:  Roger Shelter CHIEF COMPLAINT:  Dyspnea   History of Present Illness:  Abigail Castillo is a 72 y.o. female who has a PMH as below including but not limited to COPD on chronic 2-3L O2 and '10mg'$  Prednisone, OSA on nocturnal BiPAP, and who is followed by Dr. Lamonte Sakai in our office. She had recent admission 11/15 through 12/18 for AECOPD and was then discharged to [facility].  She was recently seen as an outpatient by Dr. Lamonte Sakai on 12/22 and was prescribed a slow prednisone taper with instructions to drop to '10mg'$  and continue until her next follow up appointment.  She presented back to [facility] ED 12/25 with dyspnea and hypoxia down to 65%. She was found to have hypoxic and hypercapnic respiratory failure and her RSV swab returned positive. She was placed on BiPAP and was admitted by [facility].  On 12/26, PCCM was consulted for assistance with BiPAP and COPD management.  Pertinent  Medical History:  has History of tobacco abuse; Depression; COPD (chronic obstructive pulmonary disease) ([place]); Osteoporosis; HOARSENESS, CHRONIC; HYPERTRIGLYCERIDEMIA; Memory change; B12 deficiency; Personal history of colonic polyps; Encounter for routine gynecological examination; Mild hyperlipidemia; Vitamin D deficiency; Esophageal stricture; GERD (gastroesophageal reflux disease); Schizoaffective disorder ([place]); Allergic rhinitis; Chronic respiratory failure ([place]); Acute on chronic respiratory failure with hypoxia ([place]); Elevated blood pressure reading without diagnosis of hypertension; Hepatic steatosis; Ductal carcinoma in situ (DCIS) of left breast; On home oxygen therapy; Hyponatremia; Bronchomalacia; Localized edema; COPD exacerbation ([place]); Tracheobronchomalacia; Acute on chronic respiratory failure with hypoxia and hypercapnia (HCC); COPD with acute exacerbation ([place]); RSV (respiratory syncytial  virus pneumonia); Essential hypertension; Malnutrition of moderate degree; Acute respiratory failure with hypercapnia ([place]); Pressure injury of skin; and Tracheostomy in place [facility]) on their problem list.  Significant Hospital Events: Including procedures, antibiotic start and stop dates in addition to other pertinent events   12/25 admit. 12/26 PCCM consult. 1/2 Failed BiPAP wean, Intubated 1/3 CT Head negative was completed for observed pupillary change 1/4 tracheostomy 1/8 CT head for anisocoria; neg,  1/11 continuing progressive trach collar trials 1/12 BID trach collar trials, appears comfortable  1/14 failed PSV 1/15 pulse prednisone at 40 mg continues to fail SBT trials LTAC denied awaiting vent SNF 1/16 tolerated 1 hr atc. ASA placed on hold at request of IR for PEG.   Interim History / Subjective:  Patient tolerated trach collar for 6 hours yesterday This morning she tolerated trach collar for 2 hours before became tachypneic and in respiratory distress, had to put back on full mechanical ventilatory support Remained afebrile  Objective:  Blood pressure (!) 89/57, pulse 86, temperature 98.1 F (36.7 C), resp. rate 18, height '5\' 2"'$  (1.575 m), weight 61 kg, SpO2 100 %.    Vent Mode: PRVC FiO2 (%):  [40 %] 40 % Set Rate:  [18 bmp] 18 bmp Vt Set:  [400 mL] 400 mL PEEP:  [5 cmH20] 5 cmH20 Plateau Pressure:  [15 cmH20-17 cmH20] 15 cmH20   Intake/Output Summary (Last 24 hours) at 06/14/2022 1447 Last data filed at 06/14/2022 1400 Gross per 24 hour  Intake 995 ml  Output 900 ml  Net 95 ml   Filed Weights   06/12/22 0434 06/13/22 0500 06/14/22 0434  Weight: 61.9 kg 60.9 kg 61 kg   Physical Examination: Physical exam: General: Elderly female, lying on the bed s/p trach HEENT: [place]/AT, eyes anicteric.  moist mucus membranes Neuro: Alert, awake following commands Chest: Reduced air entry all over, faint wheezes noted bilaterally Heart: Regular rate and rhythm, no murmurs or  gallops Abdomen: Soft, nontender, nondistended, bowel sounds present Skin: No rash   Resolved Hospital problems:  Hypernatremia  Assessment & Plan:  Acute on chronic respiratory failure with hypoxia/hypercarbia s/p trach Acute COPD exacerbation in the setting of RSV pneumonia Patient with severe COPD at baseline, presented with RSV pneumonia, superimposed bacterial pneumonia Now she is ventilator dependent most of the time Patient can only tolerate trach collar for 2 to 3 hours before she gets tachypneic and hypoxic Continue prednisone 30 mg daily for 2 more days before tapering to 20 mg Continue nebs continue brovana, yupelri and pulmicort  Chronic anxiety/depression Continue Klonopin, buspar and zyprexa   GERD Continue Pepcid  Breast ca Continue anastrozole  Acute/subacute L cerebellar and L frontal stroke Holding aspirin until after PEG tube per interventional radiology request  Diabetes type 2 Blood sugars are better controlled Continue Semglee 8 units daily and and sliding scale insulin with CBG goal 140-180  Moderate malnutrition Continue dietary supplements    Jacky Kindle, MD  Pulmonary Critical Care See Amion for pager If no response to pager, please call [phone] until 7pm After 7pm, Please call E-link [phone]

## 2022-06-14 NOTE — Progress Notes (Signed)
RT placed patient on 40% 12L ATC. Patient tolerating well at this time. RT will continue to monitor.

## 2022-06-15 DIAGNOSIS — F32A Depression, unspecified: Secondary | ICD-10-CM | POA: Diagnosis not present

## 2022-06-15 DIAGNOSIS — J441 Chronic obstructive pulmonary disease with (acute) exacerbation: Secondary | ICD-10-CM | POA: Diagnosis not present

## 2022-06-15 DIAGNOSIS — J121 Respiratory syncytial virus pneumonia: Secondary | ICD-10-CM | POA: Diagnosis not present

## 2022-06-15 DIAGNOSIS — J9621 Acute and chronic respiratory failure with hypoxia: Secondary | ICD-10-CM | POA: Diagnosis not present

## 2022-06-15 LAB — GLUCOSE, CAPILLARY
Glucose-Capillary: 122 mg/dL — ABNORMAL HIGH (ref 70–99)
Glucose-Capillary: 141 mg/dL — ABNORMAL HIGH (ref 70–99)
Glucose-Capillary: 169 mg/dL — ABNORMAL HIGH (ref 70–99)
Glucose-Capillary: 171 mg/dL — ABNORMAL HIGH (ref 70–99)
Glucose-Capillary: 191 mg/dL — ABNORMAL HIGH (ref 70–99)

## 2022-06-15 MED ORDER — REVEFENACIN 175 MCG/3ML IN SOLN
175.0000 ug | Freq: Every day | RESPIRATORY_TRACT | Status: DC
  Administered 2022-06-15 – 2022-07-04 (×20): 175 ug via RESPIRATORY_TRACT
  Filled 2022-06-15 (×19): qty 3

## 2022-06-15 MED ORDER — METOPROLOL TARTRATE 50 MG PO TABS
50.0000 mg | ORAL_TABLET | Freq: Three times a day (TID) | ORAL | Status: DC
  Administered 2022-06-15 – 2022-06-19 (×8): 50 mg
  Filled 2022-06-15 (×9): qty 1

## 2022-06-15 NOTE — Progress Notes (Signed)
RT placed patient on ATC 40%. Patient tolerating well at this time. RT will continue to monitor.

## 2022-06-15 NOTE — Progress Notes (Signed)
NAME:  Abigail Castillo, MRN:  [id number], DOB:  1951-04-14, LOS: 72 ADMISSION DATE:  05/19/2022, CONSULTATION DATE:  05/20/22 REFERRING MD:  Roger Shelter CHIEF COMPLAINT:  Dyspnea   History of Present Illness:  Abigail Castillo is a 72 y.o. female who has a PMH as below including but not limited to COPD on chronic 2-3L O2 and '10mg'$  Prednisone, OSA on nocturnal BiPAP, and who is followed by Dr. Lamonte Sakai in our office. She had recent admission 11/15 through 12/18 for AECOPD and was then discharged to [facility].  She was recently seen as an outpatient by Dr. Lamonte Sakai on 12/22 and was prescribed a slow prednisone taper with instructions to drop to '10mg'$  and continue until her next follow up appointment.  She presented back to [facility] ED 12/25 with dyspnea and hypoxia down to 65%. She was found to have hypoxic and hypercapnic respiratory failure and her RSV swab returned positive. She was placed on BiPAP and was admitted by [facility]  On 12/26, PCCM was consulted for assistance with BiPAP and COPD management.  Pertinent  Medical History:  has History of tobacco abuse; Depression; COPD (chronic obstructive pulmonary disease) ([place]); Osteoporosis; HOARSENESS, CHRONIC; HYPERTRIGLYCERIDEMIA; Memory change; B12 deficiency; Personal history of colonic polyps; Encounter for routine gynecological examination; Mild hyperlipidemia; Vitamin D deficiency; Esophageal stricture; GERD (gastroesophageal reflux disease); Schizoaffective disorder ([place]); Allergic rhinitis; Chronic respiratory failure ([place]); Acute on chronic respiratory failure with hypoxia ([place]); Elevated blood pressure reading without diagnosis of hypertension; Hepatic steatosis; Ductal carcinoma in situ (DCIS) of left breast; On home oxygen therapy; Hyponatremia; Bronchomalacia; Localized edema; COPD exacerbation ([place]); Tracheobronchomalacia; Acute on chronic respiratory failure with hypoxia and hypercapnia (HCC); COPD with acute exacerbation ([place]); RSV (respiratory syncytial  virus pneumonia); Essential hypertension; Malnutrition of moderate degree; Acute respiratory failure with hypercapnia ([place]); Pressure injury of skin; and Tracheostomy in place [facility]) on their problem list.  Significant Hospital Events: Including procedures, antibiotic start and stop dates in addition to other pertinent events   12/25 admit. 12/26 PCCM consult. 1/2 Failed BiPAP wean, Intubated 1/3 CT Head negative was completed for observed pupillary change 1/4 tracheostomy 1/8 CT head for anisocoria; neg,  1/11 continuing progressive trach collar trials 1/12 BID trach collar trials, appears comfortable  1/14 failed PSV 1/15 pulse prednisone at 40 mg continues to fail SBT trials LTAC denied awaiting vent SNF 1/16 tolerated 1 hr atc. ASA placed on hold at request of IR for PEG.   Interim History / Subjective:  Patient is tolerating trach collar, reaching to 7 hours, which has been the highest since she was trached Remained afebrile Denies shortness of breath  Objective:  Blood pressure (!) 113/92, pulse (!) 109, temperature 98.3 F (36.8 C), temperature source Oral, resp. rate 20, height '5\' 2"'$  (1.575 m), weight 59.8 kg, SpO2 100 %.    Vent Mode: PRVC FiO2 (%):  [40 %] 40 % Set Rate:  [18 bmp] 18 bmp Vt Set:  [400 mL] 400 mL PEEP:  [5 cmH20] 5 cmH20 Plateau Pressure:  [13 cmH20] 13 cmH20   Intake/Output Summary (Last 24 hours) at 06/15/2022 0929 Last data filed at 06/15/2022 0700 Gross per 24 hour  Intake 1340 ml  Output 1450 ml  Net -110 ml   Filed Weights   06/13/22 0500 06/14/22 0434 06/15/22 0500  Weight: 60.9 kg 61 kg 59.8 kg   Physical Examination: Physical exam: General: Elderly female, lying on the bed HEENT: [place]/AT, eyes anicteric.  moist mucus membranes, s/p trach Neuro: Alert, awake following  commands Chest: Bilateral faint wheezes Heart: Regular rate and rhythm, no murmurs or gallops Abdomen: Soft, nontender, nondistended, bowel sounds present Skin: No  rash  Resolved Hospital problems:  Hypernatremia  Assessment & Plan:  Acute on chronic respiratory failure with hypoxia/hypercarbia s/p trach Acute COPD exacerbation in the setting of RSV pneumonia Tolerating trach collar between 3 to 8 hours Continue slow prednisone taper Continue nebs continue brovana, yupelri and pulmicort  Chronic anxiety/depression Anxiety is controlled now Continue Klonopin, buspar and zyprexa   GERD Continue Pepcid  Breast ca Continue anastrozole  Acute/subacute L cerebellar and L frontal stroke Holding aspirin until after PEG tube per interventional radiology request PEG tube is a scheduled for tomorrow  Diabetes type 2 Blood sugars are better controlled Continue Semglee 8 units daily and and sliding scale insulin with CBG goal 140-180  Moderate malnutrition Continue dietary supplements    Jacky Kindle, MD [place] [facility] See Amion for pager If no response to pager, please call [phone] until 7pm After 7pm, Please call E-link [phone]

## 2022-06-16 DIAGNOSIS — Z930 Tracheostomy status: Secondary | ICD-10-CM | POA: Diagnosis not present

## 2022-06-16 DIAGNOSIS — J441 Chronic obstructive pulmonary disease with (acute) exacerbation: Secondary | ICD-10-CM | POA: Diagnosis not present

## 2022-06-16 DIAGNOSIS — J9602 Acute respiratory failure with hypercapnia: Secondary | ICD-10-CM | POA: Diagnosis not present

## 2022-06-16 DIAGNOSIS — E440 Moderate protein-calorie malnutrition: Secondary | ICD-10-CM | POA: Diagnosis not present

## 2022-06-16 LAB — CBC
HCT: 33.2 % — ABNORMAL LOW (ref 36.0–46.0)
Hemoglobin: 10.1 g/dL — ABNORMAL LOW (ref 12.0–15.0)
MCH: 30.4 pg (ref 26.0–34.0)
MCHC: 30.4 g/dL (ref 30.0–36.0)
MCV: 100 fL (ref 80.0–100.0)
Platelets: 230 K/uL (ref 150–400)
RBC: 3.32 MIL/uL — ABNORMAL LOW (ref 3.87–5.11)
RDW: 15.7 % — ABNORMAL HIGH (ref 11.5–15.5)
WBC: 9.2 K/uL (ref 4.0–10.5)
nRBC: 0 % (ref 0.0–0.2)

## 2022-06-16 LAB — PROTIME-INR
INR: 1.1 (ref 0.8–1.2)
Prothrombin Time: 14 s (ref 11.4–15.2)

## 2022-06-16 LAB — BASIC METABOLIC PANEL
Anion gap: 12 (ref 5–15)
BUN: 21 mg/dL (ref 8–23)
CO2: 38 mmol/L — ABNORMAL HIGH (ref 22–32)
Calcium: 9.2 mg/dL (ref 8.9–10.3)
Chloride: 89 mmol/L — ABNORMAL LOW (ref 98–111)
Creatinine, Ser: 0.45 mg/dL (ref 0.44–1.00)
GFR, Estimated: >60 mL/min (ref >60)
Glucose, Bld: 87 mg/dL (ref 70–99)
Potassium: 3.4 mmol/L — ABNORMAL LOW (ref 3.5–5.1)
Sodium: 139 mmol/L (ref 135–145)

## 2022-06-16 LAB — GLUCOSE, CAPILLARY
Glucose-Capillary: 101 mg/dL — ABNORMAL HIGH (ref 70–99)
Glucose-Capillary: 109 mg/dL — ABNORMAL HIGH (ref 70–99)
Glucose-Capillary: 122 mg/dL — ABNORMAL HIGH (ref 70–99)
Glucose-Capillary: 124 mg/dL — ABNORMAL HIGH (ref 70–99)
Glucose-Capillary: 133 mg/dL — ABNORMAL HIGH (ref 70–99)
Glucose-Capillary: 94 mg/dL (ref 70–99)
Glucose-Capillary: 94 mg/dL (ref 70–99)

## 2022-06-16 LAB — PHOSPHORUS: Phosphorus: 3.3 mg/dL (ref 2.5–4.6)

## 2022-06-16 LAB — MAGNESIUM: Magnesium: 2.1 mg/dL (ref 1.7–2.4)

## 2022-06-16 MED ORDER — POTASSIUM CHLORIDE 10 MEQ/100ML IV SOLN
10.0000 meq | INTRAVENOUS | Status: AC
  Administered 2022-06-16 (×4): 10 meq via INTRAVENOUS
  Filled 2022-06-16 (×4): qty 100

## 2022-06-16 NOTE — Progress Notes (Signed)
Physical Therapy Treatment Patient Details Name: Abigail Castillo MRN: [id number] DOB: 08/26/1950 Today's Date: 06/16/2022   History of Present Illness Abigail Castillo is a 72 y.o. F recently admitted for COPD flare, discharged to SNF who returned due to SpO2 65%. Found to be  RSV+, pCO2 80, started on BiPAP.   Head CT showing possible subacute infarct of the medial left cerebellar hemisphere. Failed bipap wean on 1/2, intubated. Tracheostomy 1/4. Pt with COPD and chronic respiratory failure on 2-3L home O2, depression/anxiety and hx BrCA    PT Comments    Pt progressing well towards her physical therapy goals this session; alert and agreeable to participate on trach collar. Pt requiring two person moderate assist to transfer from bed to chair. Desat to 87% during mobilizing, however, rebounded to 94% once sitting up in chair. Will continue to progress as tolerated.    Recommendations for follow up therapy are one component of a multi-disciplinary discharge planning process, led by the attending physician.  Recommendations may be updated based on patient status, additional functional criteria and insurance authorization.  Follow Up Recommendations  PT at Long-term acute care hospital Can patient physically be transported by private vehicle: No   Assistance Recommended at Discharge Frequent or constant Supervision/Assistance  Patient can return home with the following A lot of help with bathing/dressing/bathroom;Two people to help with walking and/or transfers   [facility] bed;Wheelchair (measurements PT);Wheelchair cushion (measurements PT)    Recommendations for Other Services       Precautions / Restrictions Precautions Precautions: Fall Precaution Comments: trach, vent vs trach collar Restrictions Weight Bearing Restrictions: No     Mobility  Bed Mobility Overal bed mobility: Needs Assistance Bed Mobility: Sidelying to Sit   Sidelying to sit: Mod  assist       General bed mobility comments: Pt received in R sidelying, able to bring BLE's off edge of bed, modA at trunk to power up to sitting    Transfers Overall transfer level: Needs assistance Equipment used: 2 person hand held assist Transfers: Sit to/from Stand, Bed to chair/wheelchair/BSC Sit to Stand: Mod assist, +2 physical assistance Stand pivot transfers: Mod assist, +2 physical assistance         General transfer comment: ModA + 2 to rise from edge of bed and pivot towards recliner to right. Sitting prematurely due to fatigue    Ambulation/Gait                   Stairs             Wheelchair Mobility    Modified Rankin (Stroke Patients Only)       Balance Overall balance assessment: Needs assistance Sitting-balance support: Feet supported, Single extremity supported Sitting balance-Leahy Scale: Fair     Standing balance support: Bilateral upper extremity supported, During functional activity Standing balance-Leahy Scale: Poor                              Cognition Arousal/Alertness: Awake/alert Behavior During Therapy: WFL for tasks assessed/performed Overall Cognitive Status: Difficult to assess                                 General Comments: Pt follows simple commands with slightly increased time.        Exercises General Exercises - Lower Extremity Ankle Circles/Pumps: PROM, Both, Other (comment) (1 minute  each)    General Comments        Pertinent Vitals/Pain Pain Assessment Pain Assessment: Faces Faces Pain Scale: No hurt    Home Living                          Prior Function            PT Goals (current goals can now be found in the care plan section) Acute Rehab PT Goals Patient Stated Goal: agreeable to session Potential to Achieve Goals: Fair Progress towards PT goals: Progressing toward goals    Frequency    Min 2X/week      PT Plan Current plan remains  appropriate    Co-evaluation              AM-PAC PT "6 Clicks" Mobility   Outcome Measure  Help needed turning from your back to your side while in a flat bed without using bedrails?: A Little Help needed moving from lying on your back to sitting on the side of a flat bed without using bedrails?: A Lot Help needed moving to and from a bed to a chair (including a wheelchair)?: Total Help needed standing up from a chair using your arms (e.g., wheelchair or bedside chair)?: Total Help needed to walk in hospital room?: Total Help needed climbing 3-5 steps with a railing? : Total 6 Click Score: 9    End of Session Equipment Utilized During Treatment: Oxygen Activity Tolerance: Patient tolerated treatment well Patient left: in chair;with call bell/phone within reach;with chair alarm set Nurse Communication: Mobility status PT Visit Diagnosis: Difficulty in walking, not elsewhere classified (R26.2);Unsteadiness on feet (R26.81);Other abnormalities of gait and mobility (R26.89);Muscle weakness (generalized) (M62.81)     Time: 1339-1400 PT Time Calculation (min) (ACUTE ONLY): 21 min  Charges:  $Therapeutic Activity: 8-22 mins                     Wyona Almas, PT, DPT Acute Rehabilitation Services Office [phone]    Deno Etienne 06/16/2022, 4:06 PM

## 2022-06-16 NOTE — Progress Notes (Signed)
RT placed patient on 40% ATC. Patient is tolerating well at this time. RT will continue to monitor.

## 2022-06-16 NOTE — Progress Notes (Signed)
NAME:  Abigail Castillo, MRN:  [id number], DOB:  [birth year], LOS: 22 ADMISSION DATE:  05/19/2022, CONSULTATION DATE:  05/20/22 REFERRING MD:  Roger Shelter CHIEF COMPLAINT:  Dyspnea   History of Present Illness:  SHAUNESSY DOBRATZ is a 72 y.o. female who has a PMH as below including but not limited to COPD on chronic 2-3L O2 and '10mg'$  Prednisone, OSA on nocturnal BiPAP, and who is followed by Dr. Lamonte Sakai in our office. She had recent admission 11/15 through 12/18 for AECOPD and was then discharged to [facility].  She was recently seen as an outpatient by Dr. Lamonte Sakai on 12/22 and was prescribed a slow prednisone taper with instructions to drop to '10mg'$  and continue until her next follow up appointment.  She presented back to [facility] ED 12/25 with dyspnea and hypoxia down to 65%. She was found to have hypoxic and hypercapnic respiratory failure and her RSV swab returned positive. She was placed on BiPAP and was admitted by [facility].  On 12/26, PCCM was consulted for assistance with BiPAP and COPD management.  Pertinent  Medical History:  has History of tobacco abuse; Depression; COPD (chronic obstructive pulmonary disease) ([place]); Osteoporosis; HOARSENESS, CHRONIC; HYPERTRIGLYCERIDEMIA; Memory change; B12 deficiency; Personal history of colonic polyps; Encounter for routine gynecological examination; Mild hyperlipidemia; Vitamin D deficiency; Esophageal stricture; GERD (gastroesophageal reflux disease); Schizoaffective disorder ([place]); Allergic rhinitis; Chronic respiratory failure ([place]); Acute on chronic respiratory failure with hypoxia ([place]); Elevated blood pressure reading without diagnosis of hypertension; Hepatic steatosis; Ductal carcinoma in situ (DCIS) of left breast; On home oxygen therapy; Hyponatremia; Bronchomalacia; Localized edema; COPD exacerbation ([place]); Tracheobronchomalacia; Acute on chronic respiratory failure with hypoxia and hypercapnia (HCC); COPD with acute exacerbation ([place]); RSV (respiratory syncytial  virus pneumonia); Essential hypertension; Malnutrition of moderate degree; Acute respiratory failure with hypercapnia ([place]); Pressure injury of skin; and Tracheostomy in place [facility]) on their problem list.  Significant Hospital Events: Including procedures, antibiotic start and stop dates in addition to other pertinent events   12/25 admit. 12/26 PCCM consult. 1/2 Failed BiPAP wean, Intubated 1/3 CT Head negative was completed for observed pupillary change 1/4 tracheostomy 1/8 CT head for anisocoria; neg,  1/11 continuing progressive trach collar trials 1/12 BID trach collar trials, appears comfortable  1/14 failed PSV 1/15 pulse prednisone at 40 mg continues to fail SBT trials LTAC denied awaiting vent SNF 1/16 tolerated 1 hr atc. ASA placed on hold at request of IR for PEG.   Interim History / Subjective:  Plan for PEG tube placement today Patient doing well on TCT during day resting on vent at night Currently on TCT K 3.4 this am being repleted  Objective:  Blood pressure (!) 105/51, pulse 91, temperature 98.2 F (36.8 C), temperature source Oral, resp. rate (!) 23, height '5\' 2"'$  (1.575 m), weight 61 kg, SpO2 100 %.    Vent Mode: PRVC FiO2 (%):  [40 %] 40 % Set Rate:  [18 bmp] 18 bmp Vt Set:  [400 mL] 400 mL PEEP:  [5 cmH20] 5 cmH20 Plateau Pressure:  [9 cmH20] 9 cmH20   Intake/Output Summary (Last 24 hours) at 06/16/2022 0838 Last data filed at 06/16/2022 8413 Gross per 24 hour  Intake 1386.75 ml  Output 1050 ml  Net 336.75 ml    Filed Weights   06/14/22 0434 06/15/22 0500 06/16/22 0400  Weight: 61 kg 59.8 kg 61 kg   Physical Examination: General:  Chronically ill appearing female w/ trach in place HEENT: MM pink/moist; trach in place Neuro: Aox3; MAE  CV: s1s2, RRR, no m/r/g PULM:  dim wheezing BS bilaterally; on trach collar GI: soft, bsx4 active  Extremities: warm/dry, no edema  Skin: no rashes or lesions   Resolved Hospital problems:   Hypernatremia  Assessment & Plan:  Acute on chronic respiratory failure with hypoxia/hypercarbia s/p trach Acute COPD exacerbation in the setting of RSV pneumonia Plan: -continue TCT as tolerated; rest on vent as needed -trach care per protocol -steroid taper -cont brovana, pulmicort, yupelri; prn xopenex  Chronic anxiety/depression Plan: -Continue Klonopin, buspar and zyprexa   Hypokalemia Plan: -replete K -trend bmp  GERD Plan: -H2B  Breast ca Plan: -Continue anastrozole  Acute/subacute L cerebellar and L frontal stroke Plan: -hold ASA per IR request for PEG tube procedure  Diabetes type 2 Plan: -cont ssi and cbg monitoring -cont semglee  Moderate malnutrition Plan: -Continue dietary supplements -PEG tube placement today    JD Geryl [facility] 06/16/2022, 8:47 AM  Please see [email or web address] for pager details.  From 7A-7P if no response, please call [phone]. After hours, please call ELink [phone].

## 2022-06-16 NOTE — Progress Notes (Signed)
Pt pulled out her NGT.... pt cleaned and linens changed... Pt has PEG placement for today.... notified MD.

## 2022-06-16 NOTE — Progress Notes (Signed)
[facility] ADULT ICU REPLACEMENT PROTOCOL   The patient does apply for the [facility] Adult ICU Electrolyte Replacment Protocol based on the criteria listed below:   1.Exclusion criteria: TCTS, ECMO, Dialysis, and Myasthenia Gravis patients 2. Is GFR >/= 30 ml/min? Yes.    Patient's GFR today is >60 3. Is SCr </= 2? Yes.   Patient's SCr is 0.45 mg/dL 4. Did SCr increase >/= 0.5 in 24 hours? No. 5.Pt's weight >40kg  Yes.   6. Abnormal electrolyte(s): K+3.4  7. Electrolytes replaced per protocol 8.  Call MD STAT for K+ </= 2.5, Phos </= 1, or Mag </= 1 Physician:  Dr. Nicki Gu[place], [place] Grumbling 06/16/2022 6:35 AM

## 2022-06-16 NOTE — Progress Notes (Signed)
Patient unable to have cortrak placed today.  Failed NGT placement x 2.  Rollene Rotunda, [place] notified.  Per PA, patient can wait until tomorrow to see if PEG tube will be placed.

## 2022-06-16 NOTE — Progress Notes (Signed)
Speech Language Pathology Treatment: Abigail Castillo Speaking valve  Patient Details Name: Abigail Castillo MRN: [id number] DOB: 1950-05-29 Today's Date: 06/16/2022 Time: 8270-7867 SLP Time Calculation (min) (ACUTE ONLY): 17 min  Assessment / Plan / Recommendation Clinical Impression  Pt was positioned upright in chair with sister present. Pt has been tolerating longer intervals of time on TC which allows increased opportunities for PMV trials. Pt's SpO2 was at 98% prior to placing the valve and cuff was deflated at baseline. Pt tolerated wearing the cuff for ~12 minutes today, taking breaks intermittently as SLP noted increased respirations (high 30s-low 40s) and decreased SpO2 (high 80s-low 90s). Pt needed short intervals of use followed by brief rest breaks to make trials successful. Pt appeared eager to speak to her sister, although her vocalizations were typically at the word or phrase level and were quiet and hoarse in quality. Due to need for supervision and frequent breaks, recommend wearing the PMV intermittently with staff and during therapies with full supervision.    HPI HPI: Abigail Castillo is a 72 y.o. F recently admitted for COPD flare, discharged to SNF who returned 12/25 due to SpO2 65%. Found to be RSV+, pCO2 80, started on BiPAP. Head CT showing possible subacute infarct of the medial left cerebellar hemisphere. Initial bedside swallow eval 1/1 WFL. Failed bipap wean on 1/2, intubated. Tracheostomy 1/4. Pt with COPD and chronic respiratory failure on 2-3L home O2, depression/anxiety and hx BrCA      SLP Plan  Continue with current plan of care      Recommendations for follow up therapy are one component of a multi-disciplinary discharge planning process, led by the attending physician.  Recommendations may be updated based on patient status, additional functional criteria and insurance authorization.    Recommendations         Patient may use Passy-Muir Speech Valve: During all  therapies with supervision;Intermittently with supervision PMSV Supervision: Full         Oral Care Recommendations: Oral care QID Follow Up Recommendations: SLP at Long-term acute care hospital Assistance recommended at discharge: Frequent or constant Supervision/Assistance SLP Visit Diagnosis: Aphonia (R49.1) Plan: Continue with current plan of care           Fabio Asa., Student SLP  06/16/2022, 5:09 PM

## 2022-06-17 ENCOUNTER — Inpatient Hospital Stay (HOSPITAL_COMMUNITY): Payer: 59

## 2022-06-17 DIAGNOSIS — Z9911 Dependence on respirator [ventilator] status: Secondary | ICD-10-CM | POA: Diagnosis not present

## 2022-06-17 DIAGNOSIS — J9621 Acute and chronic respiratory failure with hypoxia: Secondary | ICD-10-CM | POA: Diagnosis not present

## 2022-06-17 DIAGNOSIS — J9601 Acute respiratory failure with hypoxia: Secondary | ICD-10-CM | POA: Diagnosis not present

## 2022-06-17 DIAGNOSIS — Z930 Tracheostomy status: Secondary | ICD-10-CM | POA: Diagnosis not present

## 2022-06-17 HISTORY — PX: IR GASTROSTOMY TUBE MOD SED: IMG625

## 2022-06-17 LAB — CBC WITH DIFFERENTIAL/PLATELET
Abs Immature Granulocytes: 0.05 10*3/uL (ref 0.00–0.07)
Basophils Absolute: 0 10*3/uL (ref 0.0–0.1)
Basophils Relative: 0 %
Eosinophils Absolute: 0.1 10*3/uL (ref 0.0–0.5)
Eosinophils Relative: 1 %
HCT: 33.4 % — ABNORMAL LOW (ref 36.0–46.0)
Hemoglobin: 10.6 g/dL — ABNORMAL LOW (ref 12.0–15.0)
Immature Granulocytes: 1 %
Lymphocytes Relative: 15 %
Lymphs Abs: 1.3 10*3/uL (ref 0.7–4.0)
MCH: 31.5 pg (ref 26.0–34.0)
MCHC: 31.7 g/dL (ref 30.0–36.0)
MCV: 99.1 fL (ref 80.0–100.0)
Monocytes Absolute: 0.8 10*3/uL (ref 0.1–1.0)
Monocytes Relative: 9 %
Neutro Abs: 6.5 10*3/uL (ref 1.7–7.7)
Neutrophils Relative %: 74 %
Platelets: 203 10*3/uL (ref 150–400)
RBC: 3.37 MIL/uL — ABNORMAL LOW (ref 3.87–5.11)
RDW: 15.9 % — ABNORMAL HIGH (ref 11.5–15.5)
WBC: 8.7 10*3/uL (ref 4.0–10.5)
nRBC: 0 % (ref 0.0–0.2)

## 2022-06-17 LAB — GLUCOSE, CAPILLARY
Glucose-Capillary: 101 mg/dL — ABNORMAL HIGH (ref 70–99)
Glucose-Capillary: 107 mg/dL — ABNORMAL HIGH (ref 70–99)
Glucose-Capillary: 120 mg/dL — ABNORMAL HIGH (ref 70–99)
Glucose-Capillary: 73 mg/dL (ref 70–99)
Glucose-Capillary: 77 mg/dL (ref 70–99)
Glucose-Capillary: 80 mg/dL (ref 70–99)
Glucose-Capillary: 99 mg/dL (ref 70–99)

## 2022-06-17 LAB — COMPREHENSIVE METABOLIC PANEL
ALT: 29 U/L (ref 0–44)
AST: 24 U/L (ref 15–41)
Albumin: 3 g/dL — ABNORMAL LOW (ref 3.5–5.0)
Alkaline Phosphatase: 102 U/L (ref 38–126)
Anion gap: 12 (ref 5–15)
BUN: 20 mg/dL (ref 8–23)
CO2: 35 mmol/L — ABNORMAL HIGH (ref 22–32)
Calcium: 8.9 mg/dL (ref 8.9–10.3)
Chloride: 92 mmol/L — ABNORMAL LOW (ref 98–111)
Creatinine, Ser: 0.46 mg/dL (ref 0.44–1.00)
GFR, Estimated: >60 mL/min (ref >60)
Glucose, Bld: 72 mg/dL (ref 70–99)
Potassium: 3.7 mmol/L (ref 3.5–5.1)
Sodium: 139 mmol/L (ref 135–145)
Total Bilirubin: 2.4 mg/dL — ABNORMAL HIGH (ref 0.3–1.2)
Total Protein: 5.7 g/dL — ABNORMAL LOW (ref 6.5–8.1)

## 2022-06-17 MED ORDER — POTASSIUM CHLORIDE 10 MEQ/100ML IV SOLN
10.0000 meq | INTRAVENOUS | Status: AC
  Administered 2022-06-17 (×2): 10 meq via INTRAVENOUS
  Filled 2022-06-17 (×2): qty 100

## 2022-06-17 MED ORDER — GERHARDT'S BUTT CREAM: TOPICAL_CREAM | CUTANEOUS | Status: DC | PRN

## 2022-06-17 MED ORDER — LIDOCAINE HCL 1 % IJ SOLN
INTRAMUSCULAR | Status: AC
  Administered 2022-06-17: 6 mL via INTRADERMAL
  Filled 2022-06-17: qty 20

## 2022-06-17 MED ORDER — INSULIN GLARGINE-YFGN 100 UNIT/ML ~~LOC~~ SOLN
8.0000 [IU] | Freq: Every day | SUBCUTANEOUS | Status: DC
  Administered 2022-06-18 – 2022-07-04 (×17): 8 [IU] via SUBCUTANEOUS
  Filled 2022-06-17 (×17): qty 0.08

## 2022-06-17 MED ORDER — STERILE WATER FOR INJECTION IJ SOLN
INTRAMUSCULAR | Status: AC
  Filled 2022-06-17: qty 10

## 2022-06-17 MED ORDER — CEFAZOLIN SODIUM-DEXTROSE 2-4 GM/100ML-% IV SOLN
INTRAVENOUS | Status: AC
  Filled 2022-06-17: qty 100

## 2022-06-17 MED ORDER — PREDNISONE 20 MG PO TABS
20.0000 mg | ORAL_TABLET | Freq: Every day | ORAL | Status: DC
  Administered 2022-06-18 – 2022-06-19 (×2): 20 mg
  Filled 2022-06-17 (×2): qty 1

## 2022-06-17 MED ORDER — MIDAZOLAM HCL 2 MG/2ML IJ SOLN
INTRAMUSCULAR | Status: AC
  Filled 2022-06-17: qty 2

## 2022-06-17 MED ORDER — GLUCAGON HCL RDNA (DIAGNOSTIC) 1 MG IJ SOLR
INTRAMUSCULAR | Status: AC
  Filled 2022-06-17: qty 1

## 2022-06-17 MED ORDER — GERHARDT'S BUTT CREAM
TOPICAL_CREAM | Freq: Three times a day (TID) | CUTANEOUS | Status: DC
  Administered 2022-06-17 – 2022-07-03 (×13): 1 via TOPICAL
  Filled 2022-06-17 (×3): qty 1

## 2022-06-17 MED ORDER — METHYLPREDNISOLONE SODIUM SUCC 40 MG IJ SOLR
40.0000 mg | Freq: Once | INTRAMUSCULAR | Status: AC
  Administered 2022-06-17: 40 mg via INTRAVENOUS
  Filled 2022-06-17: qty 1

## 2022-06-17 MED ORDER — FENTANYL CITRATE (PF) 100 MCG/2ML IJ SOLN
INTRAMUSCULAR | Status: AC
  Filled 2022-06-17: qty 2

## 2022-06-17 MED ORDER — FENTANYL CITRATE (PF) 100 MCG/2ML IJ SOLN
INTRAMUSCULAR | Status: AC | PRN
  Administered 2022-06-17: 12.5 ug via INTRAVENOUS

## 2022-06-17 MED ORDER — GLUCAGON HCL (RDNA) 1 MG IJ SOLR
INTRAMUSCULAR | Status: AC | PRN
  Administered 2022-06-17: .5 mg via INTRAVENOUS

## 2022-06-17 MED ORDER — IOHEXOL 300 MG/ML  SOLN: 50.0000 mL | Freq: Once | INTRAMUSCULAR | Status: DC | PRN

## 2022-06-17 MED ORDER — MIDAZOLAM HCL 2 MG/2ML IJ SOLN
INTRAMUSCULAR | Status: AC | PRN
  Administered 2022-06-17: .5 mg via INTRAVENOUS

## 2022-06-17 NOTE — Progress Notes (Signed)
NAME:  Abigail Castillo, MRN:  [id number], DOB:  06-04-1950, LOS: 51 ADMISSION DATE:  05/19/2022, CONSULTATION DATE:  05/20/22 REFERRING MD:  Roger Shelter CHIEF COMPLAINT:  Dyspnea   History of Present Illness:  Abigail Castillo is a 72 y.o. female who has a PMH as below including but not limited to COPD on chronic 2-3L O2 and '10mg'$  Prednisone, OSA on nocturnal BiPAP, and who is followed by Dr. Lamonte Sakai in our office. She had recent admission 11/15 through 12/18 for AECOPD and was then discharged to [facility].  She was recently seen as an outpatient by Dr. Lamonte Sakai on 12/22 and was prescribed a slow prednisone taper with instructions to drop to '10mg'$  and continue until her next follow up appointment.  She presented back to [facility] ED 12/25 with dyspnea and hypoxia down to 65%. She was found to have hypoxic and hypercapnic respiratory failure and her RSV swab returned positive. She was placed on BiPAP and was admitted by [facility]  On 12/26, PCCM was consulted for assistance with BiPAP and COPD management.  Pertinent  Medical History:  has History of tobacco abuse; Depression; COPD (chronic obstructive pulmonary disease) ([employer]); Osteoporosis; HOARSENESS, CHRONIC; HYPERTRIGLYCERIDEMIA; Memory change; B12 deficiency; Personal history of colonic polyps; Encounter for routine gynecological examination; Mild hyperlipidemia; Vitamin D deficiency; Esophageal stricture; GERD (gastroesophageal reflux disease); Schizoaffective disorder ([place]); Allergic rhinitis; Chronic respiratory failure ([place]); Acute on chronic respiratory failure with hypoxia ([place]); Elevated blood pressure reading without diagnosis of hypertension; Hepatic steatosis; Ductal carcinoma in situ (DCIS) of left breast; On home oxygen therapy; Hyponatremia; Bronchomalacia; Localized edema; COPD exacerbation ([place]); Tracheobronchomalacia; Acute on chronic respiratory failure with hypoxia and hypercapnia (HCC); COPD with acute exacerbation ([place]); RSV (respiratory syncytial  virus pneumonia); Essential hypertension; Malnutrition of moderate degree; Acute respiratory failure with hypercapnia ([place]); Pressure injury of skin; and Tracheostomy in place [facility]) on their problem list.  Significant Hospital Events: Including procedures, antibiotic start and stop dates in addition to other pertinent events   12/25 admit. 12/26 PCCM consult. 1/2 Failed BiPAP wean, Intubated 1/3 CT Head negative was completed for observed pupillary change 1/4 tracheostomy 1/8 CT head for anisocoria; neg,  1/11 continuing progressive trach collar trials 1/12 BID trach collar trials, appears comfortable  1/14 failed PSV 1/15 pulse prednisone at 40 mg continues to fail SBT trials LTAC denied awaiting vent SNF 1/16 tolerated 1 hr atc. ASA placed on hold at request of IR for PEG.   Interim History / Subjective:  Abigail Castillo denies complaints.  PEG not able to be completed yesterday.  Tube feeds and oral meds have not been given since core track was discontinued overnight 1/21.  Objective:  Blood pressure 122/64, pulse 90, temperature 97.8 F (36.6 C), temperature source Oral, resp. rate 20, height '5\' 2"'$  (1.575 m), weight 61.7 kg, SpO2 100 %.    Vent Mode: PRVC FiO2 (%):  [40 %] 40 % Set Rate:  [18 bmp] 18 bmp Vt Set:  [400 mL] 400 mL PEEP:  [5 cmH20] 5 cmH20 Plateau Pressure:  [12 cmH20-13 cmH20] 13 cmH20   Intake/Output Summary (Last 24 hours) at 06/17/2022 0844 Last data filed at 06/17/2022 0400 Gross per 24 hour  Intake 167.99 ml  Output 800 ml  Net -632.01 ml    Filed Weights   06/15/22 0500 06/16/22 0400 06/17/22 0409  Weight: 59.8 kg 61 kg 61.7 kg   Physical Examination: General: Chronically ill-appearing woman lying in bed on mechanical ventilation, not sedated HEENT: [place]/AT, eyes anicteric Neck: Lurline Idol  in place, no surrounding erythema Neuro: Awake and alert, answering questions by nodding. CV: S1-S2, regular rate and rhythm PULM: Breathing comfortably on cannulation, no  rhonchi or wheezing. GI: Soft, nontender Extremities: No cyanosis or clubbing Skin: Warm, dry, no diffuse rashes  WBC 8.7 H/H 10.6/33.4 Platelets 203 BUN 20 Creatinine 0.46  Resolved Hospital problems:  Hypernatremia  Assessment & Plan:  Acute on chronic respiratory failure with hypoxia/hypercarbia s/p trach Acute COPD exacerbation in the setting of RSV pneumonia -Low tidal volume ventilation - VAP prevention protocol - Continue vent weaning efforts.  Tolerating trach collar trials part of the day. Lurline Idol care per protocol - Continue steroid taper.  Give 1 dose of IV steroids today since she is getting PEG tube and has not had steroids since 1/21.  Resume prednisone tomorrow. Garlon Hatchet, Pulmicort, Yupelri plus as needed SABA  Chronic anxiety & depression -Continue Klonopin, BuSpar, Zyprexa when enteral access is reestablished -Can use IV Versed as needed until able to give oral meds again   Hypokalemia, resolved -20 mEq of potassium IV today - Continue to monitor  GERD -Pepcid  Breast cancer -Continue anastrozole  Acute/subacute L cerebellar and L frontal stroke -resume aspirin after PEG Statin  Diabetes type 2, and hyperglycemia -SSI as needed-has not needed recently - Goal blood glucose less than 180  Moderate malnutrition Plan: -Continue dietary supplements -PEG tube placement today  This patient is critically ill with multiple organ system failure which requires frequent high complexity decision making, assessment, support, evaluation, and titration of therapies. This was completed through the application of advanced monitoring technologies and extensive interpretation of multiple databases. During this encounter critical care time was devoted to patient care services described in this note for 32 minutes.  Julian Hy, DO 06/17/22 9:14 AM [facility]  For contact information, see Amion. If no response to pager, please call PCCM  consult pager. After hours, 7PM- 7AM, please call Elink.

## 2022-06-17 NOTE — Progress Notes (Signed)
RT transported patient form 2H06 to IR and back with RN. No complications. RT will continue to monitor.

## 2022-06-17 NOTE — Progress Notes (Signed)
Occupational Therapy Treatment Patient Details Name: Abigail Castillo MRN: [id number] DOB: November 13, 1950 Today's Date: 06/17/2022   History of present illness Abigail Castillo is a 72 y.o. F recently admitted for COPD flare, discharged to SNF who returned due to SpO2 65%. Found to be  RSV+, pCO2 80, started on BiPAP.   Head CT showing possible subacute infarct of the medial left cerebellar hemisphere. Failed bipap wean on 1/2, intubated. Tracheostomy 1/4. Peg 1/23. Pt with COPD and chronic respiratory failure on 2-3L home O2, depression/anxiety and hx BrCA   OT comments  Abigail Castillo is making solid progress, pt seen after peg placement while still supported on vent. She was able to transfer to the EOB with min A, and tolerated sitting unsupported for ~10 minutes. While on EOB, pt donned bilat socks with mod A and cues for sequencing. After an extensive sitting rest break, she stood with mod A and took 3 lateral steps with max A and HHA. Due to fatigue, pt requested to return supine. OT to continue to follow acutely. POC remains appropriate.    Recommendations for follow up therapy are one component of a multi-disciplinary discharge planning process, led by the attending physician.  Recommendations may be updated based on patient status, additional functional criteria and insurance authorization.    Follow Up Recommendations  OT at Long-term acute care hospital     Assistance Recommended at Discharge Frequent or constant Supervision/Assistance  Patient can return home with the following  Two people to help with walking and/or transfers;Two people to help with bathing/dressing/bathroom;Assistance with feeding;Direct supervision/assist for medications management;Assistance with cooking/housework;Direct supervision/assist for financial management;Assist for transportation;Help with stairs or ramp for entrance   Equipment Recommendations  Other (comment)       Precautions / Restrictions Precautions Precautions:  Fall Precaution Comments: trach, vent vs trach collar, peg Restrictions Weight Bearing Restrictions: No       Mobility Bed Mobility Overal bed mobility: Needs Assistance Bed Mobility: Sidelying to Sit, Sit to Supine   Sidelying to sit: Min assist   Sit to supine: Max assist   General bed mobility comments: increased time and cues to come to sitting from sidelying, needed assist to bring hips forward.    Transfers Overall transfer level: Needs assistance Equipment used: 1 person hand held assist Transfers: Sit to/from Stand Sit to Stand: Mod assist, Max assist           General transfer comment: mod A to stand, max A to lateral step at EOB     Balance Overall balance assessment: Needs assistance Sitting-balance support: Feet supported, Single extremity supported Sitting balance-Leahy Scale: Fair     Standing balance support: Bilateral upper extremity supported, During functional activity Standing balance-Leahy Scale: Poor                             ADL either performed or assessed with clinical judgement   ADL Overall ADL's : Needs assistance/impaired                     Lower Body Dressing: Maximal assistance;Sit to/from stand Lower Body Dressing Details (indicate cue type and reason): bilat socks, modified figure four with use of therapist knee. Assist to start socks on toes, and cues to reach and pull sock up Toilet Transfer: Maximal assistance;Stand-pivot Toilet Transfer Details (indicate cue type and reason): simulated at EOB         Functional mobility during ADLs: Maximal assistance  General ADL Comments: working towards LB ADL goals, sitting balance and standing with side stepping.    Extremity/Trunk Assessment Upper Extremity Assessment Upper Extremity Assessment: Generalized weakness RUE Deficits / Details: progressing in strength, moving well against gravity. globally 3/5 LUE Deficits / Details: progressing in strength, moving  well against gravity. globally 3/5   Lower Extremity Assessment Lower Extremity Assessment: Defer to PT evaluation        Vision   Vision Assessment?: Vision impaired- to be further tested in functional context Additional Comments: overall WFL for tasks this date   Perception Perception Perception: Not tested   Praxis Praxis Praxis: Not tested    Cognition Arousal/Alertness: Awake/alert Behavior During Therapy: WFL for tasks assessed/performed Overall Cognitive Status: Difficult to assess                                 General Comments: follows simple 1 step commands with increased time. needs multimodal cues to follow 2+ steps commands              General Comments FiO2 40% PEEP 5    Pertinent Vitals/ Pain       Pain Assessment Pain Assessment: Faces Faces Pain Scale: Hurts a little bit Pain Location: peg site Pain Descriptors / Indicators: Grimacing Pain Intervention(s): Limited activity within patient's tolerance, Monitored during session   Frequency  Min 2X/week        Progress Toward Goals  OT Goals(current goals can now be found in the care plan section)  Progress towards OT goals: Progressing toward goals  Acute Rehab OT Goals Patient Stated Goal: to get stronger OT Goal Formulation: With patient Time For Goal Achievement: 06/25/22 Potential to Achieve Goals: Fair ADL Goals Pt Will Perform Grooming: with set-up;sitting Pt Will Perform Lower Body Bathing: with modified independence;sit to/from stand Pt Will Perform Upper Body Dressing: sitting;with min assist Pt Will Perform Lower Body Dressing: with modified independence;sit to/from stand Pt Will Transfer to Toilet: with modified independence;ambulating;bedside commode Pt Will Perform Toileting - Clothing Manipulation and hygiene: with modified independence;sit to/from stand Pt/caregiver will Perform Home Exercise Program: Increased strength;Both right and left upper  extremity;With Supervision Additional ADL Goal #1: Pt will perform bed mobility with max assist in preparation for ADLs. Additional ADL Goal #2: Pt will demonstrate fair sitting balance x 10 minutes in preparation for ADLs.  Plan Discharge plan remains appropriate       AM-PAC OT "6 Clicks" Daily Activity     Outcome Measure   Help from another person eating meals?: Total Help from another person taking care of personal grooming?: A Lot Help from another person toileting, which includes using toliet, bedpan, or urinal?: A Lot Help from another person bathing (including washing, rinsing, drying)?: A Lot Help from another person to put on and taking off regular upper body clothing?: A Lot Help from another person to put on and taking off regular lower body clothing?: A Lot 6 Click Score: 11    End of Session    OT Visit Diagnosis: Muscle weakness (generalized) (M62.81)   Activity Tolerance Patient tolerated treatment well   Patient Left in bed;with call bell/phone within reach   Nurse Communication Mobility status        Time: 3428-7681 OT Time Calculation (min): 22 min  Charges: OT General Charges $OT Visit: 1 Visit OT Treatments $Self Care/Home Management : 8-22 mins   Elliot Cousin 06/17/2022, 1:02 PM

## 2022-06-17 NOTE — Progress Notes (Signed)
Brief Nutrition Follow-up:  20 FR G-tube placed today by IR with Ok for use tomorrow per MD  Pt remains on vent support via trach, continuing TC trials Labs and meds reviewed  Pt previously pulled out Cortrak and an NG tube was placed. Lost enteral access on 1/21, no TF since. Pt previously tolerating TF at goal rate  Plan to resume TF tomorrow, plan to consider bolus feedings  Kerman Passey MS, RDN, LDN, CNSC Registered Dietitian 3 Clinical Nutrition RD Pager and On-Call Pager Number Located in [place]

## 2022-06-17 NOTE — Progress Notes (Signed)
Pt placed back on vent on full support sue to an increased WOB. Pt seems more comfortable at this time.

## 2022-06-17 NOTE — Procedures (Signed)
Interventional Radiology Procedure Note  Procedure: FLUORO 73PV GTUBE    Complications: None  Estimated Blood Loss:  0  Findings: CONFIRMED IN STOMACH FULL USE TOMORROW     M. Daryll Brod, MD

## 2022-06-17 NOTE — TOC Progression Note (Signed)
Transition of Care [facility]) - Progression Note    Patient Details  Name: Abigail Castillo MRN: [id number] Date of Birth: 08-06-1950  Transition of Care [facility]) CM/SW [place], [place] Phone Number: 06/17/2022, 3:05 PM  Clinical Narrative:      CSW spoke with Santiago Glad at [facility] who confirmed CSW can fax patients referral over for review for possible Vent/SNF placement. CSW spoke with Danae Chen at [facility] who confirmed [place] can fax over patients referral for review for possible Vent/SNF placement. CSW awaiting determination for possible vent/snf bed offers. CSW will continue to follow and assist with patients dc planning needs.   Expected Discharge Plan: [place] (Vent/SNF) Barriers to Discharge: Continued Medical Work up  Expected Discharge Plan and Services In-house Referral: Clinical Social Work   Post Acute Care Choice: Long Term Acute Care (LTAC) Living arrangements for the past 2 months:  (PTA came from [place] short term before that was from home alone)                                       Social Determinants of Health (SDOH) Interventions [place]: No Food Insecurity (05/30/2022)  Housing: Low Risk  (05/30/2022)  Transportation Needs: No Transportation Needs (05/30/2022)  Utilities: Not At Risk (05/30/2022)  Alcohol Screen: Low Risk  (03/26/2021)  Depression (PHQ2-9): Low Risk  (03/26/2021)  Financial Resource Strain: Low Risk  (03/26/2021)  Physical Activity: Inactive (03/26/2021)  Social Connections: Moderately Isolated (03/26/2021)  Stress: No Stress Concern Present (03/26/2021)  Tobacco Use: Medium Risk (06/17/2022)    Readmission Risk Interventions     No data to display

## 2022-06-18 DIAGNOSIS — J441 Chronic obstructive pulmonary disease with (acute) exacerbation: Secondary | ICD-10-CM | POA: Diagnosis not present

## 2022-06-18 DIAGNOSIS — J9621 Acute and chronic respiratory failure with hypoxia: Secondary | ICD-10-CM | POA: Diagnosis not present

## 2022-06-18 DIAGNOSIS — J9601 Acute respiratory failure with hypoxia: Secondary | ICD-10-CM | POA: Diagnosis not present

## 2022-06-18 DIAGNOSIS — J9622 Acute and chronic respiratory failure with hypercapnia: Secondary | ICD-10-CM | POA: Diagnosis not present

## 2022-06-18 LAB — CBC
HCT: 33 % — ABNORMAL LOW (ref 36.0–46.0)
Hemoglobin: 10.5 g/dL — ABNORMAL LOW (ref 12.0–15.0)
MCH: 30.9 pg (ref 26.0–34.0)
MCHC: 31.8 g/dL (ref 30.0–36.0)
MCV: 97.1 fL (ref 80.0–100.0)
Platelets: 266 K/uL (ref 150–400)
RBC: 3.4 MIL/uL — ABNORMAL LOW (ref 3.87–5.11)
RDW: 15.4 % (ref 11.5–15.5)
WBC: 9.1 K/uL (ref 4.0–10.5)
nRBC: 0 % (ref 0.0–0.2)

## 2022-06-18 LAB — BASIC METABOLIC PANEL WITH GFR
Anion gap: 10 (ref 5–15)
BUN: 18 mg/dL (ref 8–23)
CO2: 34 mmol/L — ABNORMAL HIGH (ref 22–32)
Calcium: 9 mg/dL (ref 8.9–10.3)
Chloride: 94 mmol/L — ABNORMAL LOW (ref 98–111)
Creatinine, Ser: 0.61 mg/dL (ref 0.44–1.00)
GFR, Estimated: >60 mL/min
Glucose, Bld: 92 mg/dL (ref 70–99)
Potassium: 3.9 mmol/L (ref 3.5–5.1)
Sodium: 138 mmol/L (ref 135–145)

## 2022-06-18 LAB — GLUCOSE, CAPILLARY
Glucose-Capillary: 106 mg/dL — ABNORMAL HIGH (ref 70–99)
Glucose-Capillary: 150 mg/dL — ABNORMAL HIGH (ref 70–99)
Glucose-Capillary: 176 mg/dL — ABNORMAL HIGH (ref 70–99)
Glucose-Capillary: 195 mg/dL — ABNORMAL HIGH (ref 70–99)
Glucose-Capillary: 84 mg/dL (ref 70–99)
Glucose-Capillary: 91 mg/dL (ref 70–99)

## 2022-06-18 MED ORDER — ASPIRIN 81 MG PO CHEW
81.0000 mg | CHEWABLE_TABLET | Freq: Every day | ORAL | Status: DC
  Administered 2022-06-19 – 2022-07-04 (×16): 81 mg
  Filled 2022-06-18 (×16): qty 1

## 2022-06-18 MED ORDER — OSMOLITE 1.5 CAL PO LIQD
237.0000 mL | Freq: Every day | ORAL | Status: DC
  Administered 2022-06-18 – 2022-07-04 (×79): 237 mL
  Filled 2022-06-18 (×40): qty 237

## 2022-06-18 MED ORDER — FREE WATER: 200.0000 mL | Freq: Four times a day (QID) | Status: DC

## 2022-06-18 MED ORDER — ORAL CARE MOUTH RINSE
15.0000 mL | OROMUCOSAL | Status: DC
  Administered 2022-06-18 – 2022-06-27 (×35): 15 mL via OROMUCOSAL

## 2022-06-18 MED ORDER — ORAL CARE MOUTH RINSE: 15.0000 mL | OROMUCOSAL | Status: DC | PRN

## 2022-06-18 MED ORDER — FREE WATER
200.0000 mL | Freq: Three times a day (TID) | Status: DC
  Administered 2022-06-18 – 2022-06-24 (×18): 200 mL

## 2022-06-18 NOTE — Progress Notes (Signed)
NAME:  Abigail Castillo, MRN:  [id number], DOB:  [birth year], LOS: 59 ADMISSION DATE:  05/19/2022, CONSULTATION DATE:  05/20/22 REFERRING MD:  Roger Shelter CHIEF COMPLAINT:  Dyspnea   History of Present Illness:  Abigail Castillo is a 72 y.o. female who has a PMH as below including but not limited to COPD on chronic 2-3L O2 and '10mg'$  Prednisone, OSA on nocturnal BiPAP, and who is followed by Dr. Lamonte Sakai in our office. She had recent admission 11/15 through 12/18 for AECOPD and was then discharged to [facility].  She was recently seen as an outpatient by Dr. Lamonte Sakai on 12/22 and was prescribed a slow prednisone taper with instructions to drop to '10mg'$  and continue until her next follow up appointment.  She presented back to [facility] ED 12/25 with dyspnea and hypoxia down to 65%. She was found to have hypoxic and hypercapnic respiratory failure and her RSV swab returned positive. She was placed on BiPAP and was admitted by [facility].  On 12/26, PCCM was consulted for assistance with BiPAP and COPD management.  Pertinent  Medical History:  has History of tobacco abuse; Depression; COPD (chronic obstructive pulmonary disease) ([place]); Osteoporosis; HOARSENESS, CHRONIC; HYPERTRIGLYCERIDEMIA; Memory change; B12 deficiency; Personal history of colonic polyps; Encounter for routine gynecological examination; Mild hyperlipidemia; Vitamin D deficiency; Esophageal stricture; GERD (gastroesophageal reflux disease); Schizoaffective disorder ([place]); Allergic rhinitis; Chronic respiratory failure ([place]); Acute on chronic respiratory failure with hypoxia ([place]); Elevated blood pressure reading without diagnosis of hypertension; Hepatic steatosis; Ductal carcinoma in situ (DCIS) of left breast; On home oxygen therapy; Hyponatremia; Bronchomalacia; Localized edema; COPD exacerbation ([place]); Tracheobronchomalacia; Acute on chronic respiratory failure with hypoxia and hypercapnia (HCC); COPD with acute exacerbation ([place]); RSV (respiratory syncytial  virus pneumonia); Essential hypertension; Malnutrition of moderate degree; Acute respiratory failure with hypercapnia ([place]); Pressure injury of skin; and Tracheostomy in place [facility]) on their problem list.  Significant Hospital Events: Including procedures, antibiotic start and stop dates in addition to other pertinent events   12/25 admit. 12/26 PCCM consult. 1/2 Failed BiPAP wean, Intubated 1/3 CT Head negative was completed for observed pupillary change 1/4 tracheostomy 1/8 CT head for anisocoria; neg,  1/11 continuing progressive trach collar trials 1/12 BID trach collar trials, appears comfortable  1/14 failed PSV 1/15 pulse prednisone at 40 mg continues to fail SBT trials LTAC denied awaiting vent SNF 1/16 tolerated 1 hr atc. ASA placed on hold at request of IR for PEG.  1/23 PEG placement  Interim History / Subjective:  No complaints. No pain from PEG tube.  Objective:  Blood pressure 94/67, pulse 81, temperature 98.1 F (36.7 C), temperature source Oral, resp. rate 17, height '5\' 2"'$  (1.575 m), weight 61.7 kg, SpO2 98 %.    Vent Mode: CPAP;PSV FiO2 (%):  [40 %] 40 % Set Rate:  [18 bmp] 18 bmp Vt Set:  [400 mL] 400 mL PEEP:  [5 cmH20] 5 cmH20 Pressure Support:  [10 cmH20] 10 cmH20 Plateau Pressure:  [9 cmH20-20 cmH20] 20 cmH20   Intake/Output Summary (Last 24 hours) at 06/18/2022 1106 Last data filed at 06/17/2022 2243 Gross per 24 hour  Intake 203 ml  Output 550 ml  Net -347 ml    Filed Weights   06/15/22 0500 06/16/22 0400 06/17/22 0409  Weight: 59.8 kg 61 kg 61.7 kg   Physical Examination: General: chronically ill appearing man lying in bed in NAD HEENT: /AT, eyes anicteric Neck: trach in place, no surrounding erythema or bleeding Neuro: awake, alert, moving all extremities CV:  S1S2, RRR PULM: breathing comfortably on MV, faint wheezing R base, otherwise CTA GI: soft, NT. No bleeding or erythema around PEG site Extremities: no c/c/e, minimal muscle  mass Skin: warm, dry, no diffuse rashes  WBC 9.1 H/H 10.5/33.0 Platelets 266 BUN 18 Creatinine 0.61  Resolved Hospital problems:  Hypernatremia  Assessment & Plan:  Acute on chronic respiratory failure with hypoxia/hypercarbia s/p trach Acute COPD exacerbation in the setting of RSV pneumonia -LTVV - VAP prevention protocol - PAD protocol-not currently needing sedation - Continue vent weaning efforts. -Trach care per protocol - Continue steroid taper, resuming prednisone today - Brovana, Yupelri, Pulmicort plus albuterol as needed  Chronic anxiety & depression - Continue Klonopin, Zyprexa, BuSpar  Hypokalemia, resolved - monitor periodically; does not need daily labs  GERD -Pepcid  Breast cancer - Continue anastrozole  Acute/subacute L cerebellar and L frontal stroke -Aspirin, statin  Diabetes type 2, and hyperglycemia -SSI PRN -goal BG <180  Moderate malnutrition Plan: -start back on TF -PEG tube placemed 1/23, able to start using today -banatrol for diarrhea  This patient is critically ill with multiple organ system failure which requires frequent high complexity decision making, assessment, support, evaluation, and titration of therapies. This was completed through the application of advanced monitoring technologies and extensive interpretation of multiple databases. During this encounter critical care time was devoted to patient care services described in this note for 31 minutes.  Julian Hy, DO 06/18/22 1:45 PM  Pulmonary & Critical Care  For contact information, see Amion. If no response to pager, please call PCCM consult pager. After hours, 7PM- 7AM, please call [place].

## 2022-06-18 NOTE — Consult Note (Signed)
   [facility] Inpatient Consult   06/18/2022  Abigail Castillo [birth year] [id number]  [place] [facility] [ACO] Patient: [name] Medicare  Primary Care Provider:  [name], NP  Follow up: LLOS day 30, extreme high risk score for unplanned readmission risk  Patient screened for hospitalization with noted extreme high risk score for unplanned readmission risk (LLOS) length of stay and for disposition follow up needs. Paitient's electronic medical record briefly reviewed for disposition with inpatient TOC LCSW/team progress notes. Patient is being recommended for a SNF Vent level of care for transition.   Plan:  No [facility] Community Care Coordination is assessed for post hospital follow up if disposition is for SNF Vent level of care. If plan remains for transition then this writer will sign off at transition.  Of note, [facility] Care Management/Population Health does not replace or interfere with any arrangements made by the Inpatient Transition of Care team.  For questions contact:   Natividad Brood, RN BSN [place]  [phone] business mobile phone Toll free office [phone]  *[place]  [phone] Fax number: [phone] [email or web address]

## 2022-06-18 NOTE — Progress Notes (Signed)
Nutrition Follow-up  DOCUMENTATION CODES:   Non-severe (moderate) malnutrition in context of acute illness/injury  INTERVENTION:   Tube Feeding via G-tube: Trial bolus feedings Osmolite 1.5 237 mL (1 carton) 5 times daily Pro-Source TF20 60 mL daily This provides 1855 kals, 95 g of protein and 905 mL of free water  Adjust current free water flushes to meet hydration needs. Pt receiving 60 mL of free water before and after each bolus feeding (120 mL x 5 feedings = 600 mL). Recommend decreasing current free water flushes to 200 mL q 8 hours. Total free water from TF, flushes with bolus feedings and new FWF provides 1950 mL of free water  NUTRITION DIAGNOSIS:   Moderate Malnutrition related to acute illness (COPD exacerbation) as evidenced by mild fat depletion, mild muscle depletion, percent weight loss.  Being addressed via TF, G-tube  GOAL:   Patient will meet greater than or equal to 90% of their needs  Met via TF  MONITOR:   PO intake, Supplement acceptance, Labs, Weight trends  REASON FOR ASSESSMENT:   Consult Assessment of nutrition requirement/status  ASSESSMENT:   Pt admitted from [facility] SN[facility] with SOB r/t COPD exacerbation. PMH significant for breast cancer (2022), COPD on 2-3L home O2, depression/anxiety. Recently admitted 11/15-12/18 for COPD exacerbation.  12/25 Admitted 01/02 Intubated 01/03 Cortrak placed 01/04 Trach placed 01/23 IR placed 20 Fr G-tube  TOC team working on Vent-SNF placement, pt denied LTAC   Continuing vent weaning as able, TC trials  TF held for G-tube placement, currently no active TF orders. Plan to resume TF via G-tube today  Hypernatremia resolved. Current free water flush of 200 mL q 4 hours  +BM, type 5-type 7, no rectal tube or rectal pouch, Receiving banatrol  Labs: CBGs 84-120, sodium wdl, Creatinine wdl Meds: ss novolog, semglee, MVI with Minerals, prednison, torsemide   Diet Order:   Diet Order             Diet  NPO time specified Except for: Ice Chips  Diet effective now                   EDUCATION NEEDS:   Not appropriate for education at this time  Skin:  Skin Assessment: Reviewed RN Assessment  Last BM:  1/23  Height:   Ht Readings from Last 1 Encounters:  06/12/22 '5\' 2"'$  (1.575 m)    Weight:   Wt Readings from Last 1 Encounters:  06/17/22 61.7 kg   BMI:  Body mass index is 24.88 kg/m.  Estimated Nutritional Needs:   Kcal:  1700-1900 kcal/d  Protein:  90-105  Fluid:  1.8-2 L/d   Kerman Passey MS, RDN, LDN, CNSC Registered Dietitian 3 Clinical Nutrition RD Pager and On-Call Pager Number Located in [place]

## 2022-06-18 NOTE — TOC Progression Note (Addendum)
Transition of Care [facility]) - Progression Note    Patient Details  Name: Abigail Castillo MRN: [id number] Date of Birth: [birth year]  Transition of Care [facility]) CM/SW [place], [place] Phone Number: 06/18/2022, 1:50 PM  Clinical Narrative:     Update-3:02pm- CSW tried to call Santiago Glad with [facility] again left another message. CSW still waiting call back regarding status of referral. CSW called back [place] with [facility]. No answer. CSW awaiting call back to check status of referral.  CSW called Santiago Glad with [facility] and LVM. CSW awaiting call back to see if they had a chance to look at referral sent over for possible Vent/SNF placement. CSW called [place] with [facility] in [place] and LVM. CSW awaiting call back to check status of referral. CSW will continue to follow.   Expected Discharge Plan: [place] (Vent/SNF) Barriers to Discharge: Continued Medical Work up  Expected Discharge Plan and Services In-house Referral: Clinical Social Work   Post Acute Care Choice: Long Term Acute Care (LTAC) Living arrangements for the past 2 months:  (PTA came from [place] short term before that was from home alone)                                       Social Determinants of Health (SDOH) Interventions [place]: No Food Insecurity (05/30/2022)  Housing: Low Risk  (05/30/2022)  Transportation Needs: No Transportation Needs (05/30/2022)  Utilities: Not At Risk (05/30/2022)  Alcohol Screen: Low Risk  (03/26/2021)  Depression (PHQ2-9): Low Risk  (03/26/2021)  Financial Resource Strain: Low Risk  (03/26/2021)  Physical Activity: Inactive (03/26/2021)  Social Connections: Moderately Isolated (03/26/2021)  Stress: No Stress Concern Present (03/26/2021)  Tobacco Use: Medium Risk (06/17/2022)    Readmission Risk Interventions     No data to display

## 2022-06-19 ENCOUNTER — Inpatient Hospital Stay (HOSPITAL_COMMUNITY): Payer: 59

## 2022-06-19 DIAGNOSIS — Z9911 Dependence on respirator [ventilator] status: Secondary | ICD-10-CM | POA: Diagnosis not present

## 2022-06-19 DIAGNOSIS — J441 Chronic obstructive pulmonary disease with (acute) exacerbation: Secondary | ICD-10-CM | POA: Diagnosis not present

## 2022-06-19 DIAGNOSIS — Z930 Tracheostomy status: Secondary | ICD-10-CM | POA: Diagnosis not present

## 2022-06-19 DIAGNOSIS — J9621 Acute and chronic respiratory failure with hypoxia: Secondary | ICD-10-CM | POA: Diagnosis not present

## 2022-06-19 LAB — BASIC METABOLIC PANEL
Anion gap: 8 (ref 5–15)
BUN: 20 mg/dL (ref 8–23)
CO2: 37 mmol/L — ABNORMAL HIGH (ref 22–32)
Calcium: 8.9 mg/dL (ref 8.9–10.3)
Chloride: 92 mmol/L — ABNORMAL LOW (ref 98–111)
Creatinine, Ser: 0.43 mg/dL — ABNORMAL LOW (ref 0.44–1.00)
GFR, Estimated: >60 mL/min (ref >60)
Glucose, Bld: 120 mg/dL — ABNORMAL HIGH (ref 70–99)
Potassium: 3.3 mmol/L — ABNORMAL LOW (ref 3.5–5.1)
Sodium: 137 mmol/L (ref 135–145)

## 2022-06-19 LAB — GLUCOSE, CAPILLARY
Glucose-Capillary: 83 mg/dL (ref 70–99)
Glucose-Capillary: 190 mg/dL — ABNORMAL HIGH (ref 70–99)
Glucose-Capillary: 227 mg/dL — ABNORMAL HIGH (ref 70–99)
Glucose-Capillary: 152 mg/dL — ABNORMAL HIGH (ref 70–99)
Glucose-Capillary: 231 mg/dL — ABNORMAL HIGH (ref 70–99)
Glucose-Capillary: 232 mg/dL — ABNORMAL HIGH (ref 70–99)

## 2022-06-19 MED ORDER — METHYLPREDNISOLONE SODIUM SUCC 40 MG IJ SOLR
40.0000 mg | Freq: Once | INTRAMUSCULAR | Status: AC
  Administered 2022-06-19: 40 mg via INTRAVENOUS
  Filled 2022-06-19: qty 1

## 2022-06-19 MED ORDER — PREDNISONE 2.5 MG PO TABS: 2.5000 mg | ORAL_TABLET | Freq: Every day | ORAL | Status: DC

## 2022-06-19 MED ORDER — POTASSIUM CHLORIDE 20 MEQ PO PACK
40.0000 meq | PACK | Freq: Once | ORAL | Status: AC
  Administered 2022-06-19: 40 meq
  Filled 2022-06-19: qty 2

## 2022-06-19 MED ORDER — PREDNISONE 20 MG PO TABS
10.0000 mg | ORAL_TABLET | Freq: Every day | ORAL | Status: AC
  Administered 2022-06-24 – 2022-06-26 (×3): 10 mg
  Filled 2022-06-19 (×3): qty 1

## 2022-06-19 MED ORDER — PREDNISONE 5 MG PO TABS
15.0000 mg | ORAL_TABLET | Freq: Every day | ORAL | Status: AC
  Administered 2022-06-21 – 2022-06-23 (×3): 15 mg
  Filled 2022-06-19 (×3): qty 1

## 2022-06-19 MED ORDER — PREDNISONE 20 MG PO TABS
20.0000 mg | ORAL_TABLET | Freq: Every day | ORAL | Status: AC
  Administered 2022-06-20: 20 mg
  Filled 2022-06-19: qty 1

## 2022-06-19 MED ORDER — POTASSIUM CHLORIDE 20 MEQ PO PACK
20.0000 meq | PACK | Freq: Every day | ORAL | Status: DC
  Administered 2022-06-20 – 2022-06-27 (×8): 20 meq
  Filled 2022-06-19 (×8): qty 1

## 2022-06-19 MED ORDER — PREDNISONE 5 MG PO TABS: 5.0000 mg | ORAL_TABLET | Freq: Every day | ORAL | Status: DC

## 2022-06-19 NOTE — TOC Progression Note (Signed)
Transition of Care [facility]) - Progression Note    Patient Details  Name: Abigail Castillo MRN: [id number] Date of Birth: 30-Apr-1951  Transition of Care [facility]) CM/SW [place], [place] Phone Number: 06/19/2022, 10:15 AM  Clinical Narrative:     CSW spoke with Santiago Glad with [facility] who informed CSW that they can re look at patient around Feb. 1st for possible placement. Lurline Idol needs to be 25 days old at 28% FiO2. CSW following to refax out for possible placement closer to patient being medically ready for dc. CSW will continue to follow.  Expected Discharge Plan: [place] (Vent/SNF) Barriers to Discharge: Continued Medical Work up  Expected Discharge Plan and Services In-house Referral: Clinical Social Work   Post Acute Care Choice: Long Term Acute Care (LTAC) Living arrangements for the past 2 months:  (PTA came from [place] short term before that was from home alone)                                       Social Determinants of Health (SDOH) Interventions [place]: No Food Insecurity (05/30/2022)  Housing: Low Risk  (05/30/2022)  Transportation Needs: No Transportation Needs (05/30/2022)  Utilities: Not At Risk (05/30/2022)  Alcohol Screen: Low Risk  (03/26/2021)  Depression (PHQ2-9): Low Risk  (03/26/2021)  Financial Resource Strain: Low Risk  (03/26/2021)  Physical Activity: Inactive (03/26/2021)  Social Connections: Moderately Isolated (03/26/2021)  Stress: No Stress Concern Present (03/26/2021)  Tobacco Use: Medium Risk (06/17/2022)    Readmission Risk Interventions     No data to display

## 2022-06-19 NOTE — Progress Notes (Signed)
Physical Therapy Treatment Patient Details Name: Abigail Castillo MRN: [id number] DOB: 1950/12/21 Today's Date: 06/19/2022   History of Present Illness Mrs. Diefendorf is a 72 y.o. F recently admitted for COPD flare, discharged to SNF who returned due to SpO2 65%. Found to be  RSV+, pCO2 80, started on BiPAP.   Head CT showing possible subacute infarct of the medial left cerebellar hemisphere. Failed bipap wean on 1/2, intubated. Tracheostomy 1/4. Pt with COPD and chronic respiratory failure on 2-3L home O2, depression/anxiety and hx BrCA    PT Comments    Pt is making gradual progress with therapy, transitioning sidelying to sit with minA and transferring to stand with modAx1 today. However, pt fatigues quickly and sits prematurely, needing maxA to safely pivot to recliner from the bed. Pt demonstrates deficits in bil ankle dorsiflexion ROM and gross overall weakness, thus focused remainder of session on increasing ROM and strength in her lower extremities. Will continue to follow acutely. Current recommendations remain appropriate.     Recommendations for follow up therapy are one component of a multi-disciplinary discharge planning process, led by the attending physician.  Recommendations may be updated based on patient status, additional functional criteria and insurance authorization.  Follow Up Recommendations  PT at Long-term acute care hospital Can patient physically be transported by private vehicle: No   Assistance Recommended at Discharge Frequent or constant Supervision/Assistance  Patient can return home with the following A lot of help with bathing/dressing/bathroom;Two people to help with walking and/or transfers   [place] [facility] bed;Wheelchair (measurements PT);Wheelchair cushion (measurements PT);Other (comment) (hoyer lift)    Recommendations for Other Services       Precautions / Restrictions Precautions Precautions: Fall Precaution Comments: trach,  vent vs trach collar Restrictions Weight Bearing Restrictions: No     Mobility  Bed Mobility Overal bed mobility: Needs Assistance Bed Mobility: Sidelying to Sit   Sidelying to sit: Min assist, HOB elevated       General bed mobility comments: Pt able to bring legs off EOB and position self to push up from L side to sit up L EOB, but needed minA for pt to pull on therapist with R UE to ascend trunk.    Transfers Overall transfer level: Needs assistance Equipment used: 1 person hand held assist Transfers: Sit to/from Stand, Bed to chair/wheelchair/BSC Sit to Stand: Mod assist Stand pivot transfers: Max assist         General transfer comment: Pt stood 2x from EOB and partially stood to scoot posteriorly in recliner 1x with modA to power up to stand and shift weight anteriorly and block bil knees. MaxA with bil knees blocked to pivot to L from bed > chair, with pt sitting prematurely so needing extensive assistance to safely negotiate her buttocks to the chair.    Ambulation/Gait               General Gait Details: deferred   Stairs             Wheelchair Mobility    Modified Rankin (Stroke Patients Only)       Balance Overall balance assessment: Needs assistance Sitting-balance support: Feet supported, Single extremity supported Sitting balance-Leahy Scale: Fair     Standing balance support: Bilateral upper extremity supported, During functional activity Standing balance-Leahy Scale: Poor Standing balance comment: Bil UE support and knees blocked with mod-maxA for standing balance. Pt fatigues quickly in standing.  Cognition Arousal/Alertness: Awake/alert Behavior During Therapy: WFL for tasks assessed/performed Overall Cognitive Status: Difficult to assess                                 General Comments: follows simple 1 step commands with increased time. needs multimodal cues to follow 2+  steps commands        Exercises General Exercises - Lower Extremity Ankle Circles/Pumps: Both, AAROM, Strengthening, 10 reps, Seated Long Arc Quad: AROM, Both, 15 reps, Seated, Strengthening (>15 reps each but only achieving full AROM ~5-10x each leg) Hip ABduction/ADduction: AROM, Both, 5 reps, Strengthening, Seated Hip Flexion/Marching: AAROM, Strengthening, Both, 5 reps, Seated Other Exercises Other Exercises: PROM x > 30 seconds to bil ankles into dorsiflexion with knees extended while seated in recliner Other Exercises: positioned pt in recliner with feet flat on stack of blankets to facilitate ankle dorsiflexion stretch with knees flexed end of session - RN aware    General Comments General comments (skin integrity, edema, etc.): VSS trach on vent      Pertinent Vitals/Pain Pain Assessment Pain Assessment: Faces Faces Pain Scale: Hurts a little bit Pain Location: generalized with mobility Pain Descriptors / Indicators: Grimacing, Discomfort Pain Intervention(s): Monitored during session, Limited activity within patient's tolerance, Repositioned    Home Living                          Prior Function            PT Goals (current goals can now be found in the care plan section) Acute Rehab PT Goals Patient Stated Goal: agreeable to session PT Goal Formulation: With patient Time For Goal Achievement: 06/23/22 Potential to Achieve Goals: Fair Progress towards PT goals: Progressing toward goals    Frequency    Min 2X/week      PT Plan Equipment recommendations need to be updated    Co-evaluation              AM-PAC PT "6 Clicks" Mobility   Outcome Measure  Help needed turning from your back to your side while in a flat bed without using bedrails?: A Little Help needed moving from lying on your back to sitting on the side of a flat bed without using bedrails?: A Little Help needed moving to and from a bed to a chair (including a wheelchair)?: A  Lot Help needed standing up from a chair using your arms (e.g., wheelchair or bedside chair)?: A Lot Help needed to walk in hospital room?: Total Help needed climbing 3-5 steps with a railing? : Total 6 Click Score: 12    End of Session Equipment Utilized During Treatment: Oxygen;Gait belt Activity Tolerance: Patient tolerated treatment well;Patient limited by fatigue Patient left: in chair;with call bell/phone within reach;with chair alarm set Nurse Communication: Mobility status;Need for lift equipment PT Visit Diagnosis: Difficulty in walking, not elsewhere classified (R26.2);Unsteadiness on feet (R26.81);Other abnormalities of gait and mobility (R26.89);Muscle weakness (generalized) (M62.81)     Time: 3500-9381 PT Time Calculation (min) (ACUTE ONLY): 27 min  Charges:  $Therapeutic Exercise: 8-22 mins $Therapeutic Activity: 8-22 mins                     Moishe Spice, PT, DPT Acute Rehabilitation Services  Office: [phone]    Orvan Falconer 06/19/2022, 2:22 PM

## 2022-06-19 NOTE — Progress Notes (Addendum)
SLP Cancellation Note  Patient Details Name: Abigail Castillo MRN: [id number] DOB: 02/25/1951   Cancelled treatment:       Reason Eval/Treat Not Completed: Other (comment) Pt getting cleaned up by nursing at the moment but did discuss plan and will try to f/u as able.  Attempted to see her again this afternoon but now on vent. Will continue efforts.    Osie Bond., M.A. [place] Office [phone]  Secure chat preferred  06/19/2022, 10:08 AM

## 2022-06-19 NOTE — Progress Notes (Addendum)
NAME:  Abigail Castillo, MRN:  [id number], DOB:  [birth year], LOS: 5 ADMISSION DATE:  05/19/2022, CONSULTATION DATE:  05/20/22 REFERRING MD:  Roger Shelter CHIEF COMPLAINT:  Dyspnea   History of Present Illness:  Abigail Castillo is a 72 y.o. female who has a PMH as below including but not limited to COPD on chronic 2-3L O2 and '10mg'$  Prednisone, OSA on nocturnal BiPAP, and who is followed by Dr. Lamonte Sakai in our office. She had recent admission 11/15 through 12/18 for AECOPD and was then discharged to [facility].  She was recently seen as an outpatient by Dr. Lamonte Sakai on 12/22 and was prescribed a slow prednisone taper with instructions to drop to '10mg'$  and continue until her next follow up appointment.  She presented back to [facility] ED 12/25 with dyspnea and hypoxia down to 65%. She was found to have hypoxic and hypercapnic respiratory failure and her RSV swab returned positive. She was placed on BiPAP and was admitted by [facility].  On 12/26, PCCM was consulted for assistance with BiPAP and COPD management.  Pertinent  Medical History:  has History of tobacco abuse; Depression; COPD (chronic obstructive pulmonary disease) ([place]); Osteoporosis; HOARSENESS, CHRONIC; HYPERTRIGLYCERIDEMIA; Memory change; B12 deficiency; Personal history of colonic polyps; Encounter for routine gynecological examination; Mild hyperlipidemia; Vitamin D deficiency; Esophageal stricture; GERD (gastroesophageal reflux disease); Schizoaffective disorder ([place]); Allergic rhinitis; Chronic respiratory failure ([place]); Acute on chronic respiratory failure with hypoxia ([place]); Elevated blood pressure reading without diagnosis of hypertension; Hepatic steatosis; Ductal carcinoma in situ (DCIS) of left breast; On home oxygen therapy; Hyponatremia; Bronchomalacia; Localized edema; COPD exacerbation ([place]); Tracheobronchomalacia; Acute on chronic respiratory failure with hypoxia and hypercapnia (HCC); COPD with acute exacerbation ([place]); RSV (respiratory syncytial  virus pneumonia); Essential hypertension; Malnutrition of moderate degree; Acute respiratory failure with hypercapnia ([place]); Pressure injury of skin; and Tracheostomy in place [facility]) on their problem list.  Significant Hospital Events: Including procedures, antibiotic start and stop dates in addition to other pertinent events   12/25 admit. 12/26 PCCM consult. 1/2 Failed BiPAP wean, Intubated 1/3 CT Head negative was completed for observed pupillary change 1/4 tracheostomy 1/8 CT head for anisocoria; neg,  1/11 continuing progressive trach collar trials 1/12 BID trach collar trials, appears comfortable  1/14 failed PSV 1/15 pulse prednisone at 40 mg continues to fail SBT trials LTAC denied awaiting vent SNF 1/16 tolerated 1 hr atc. ASA placed on hold at request of IR for PEG.  1/23 PEG placement  Interim History / Subjective:  On ATC for 25hrs but now w/increased WOB/ mild desaturations.  No significant secretions.  Pt denies pain.   Few soft BP's overnight   Objective:  Blood pressure 99/73, pulse (!) 103, temperature 97.9 F (36.6 C), temperature source Axillary, resp. rate 20, height '5\' 2"'$  (1.575 m), weight 62 kg, SpO2 97 %.    FiO2 (%):  [30 %-50 %] 30 %   Intake/Output Summary (Last 24 hours) at 06/19/2022 1104 Last data filed at 06/19/2022 0400 Gross per 24 hour  Intake 587 ml  Output 880 ml  Net -293 ml   Filed Weights   06/16/22 0400 06/17/22 0409 06/19/22 0419  Weight: 61 kg 61.7 kg 62 kg   Physical Examination: General:  chronically ill appearing older female sitting upright in bed in NAD HEENT: MM pink/dry, edentulous, pupils 3/reactive, anicteric, midline trach Neuro: opens eyes to verbal, lethargic, follows commands in all extremities CV: rr, no murmur, +1 pulses  PULM:  MV supported, very diminished throughout, no wheeze,  mild secretions, mostly after turns GI: soft, bs+, NT, peg site wnl, purwick Extremities: warm/dry, no LE edema  Skin: no rashes   UOP 880  ml/ 24rs plus one unmeasured  Afebrile   Resolved Hospital problems:  Hypernatremia  Assessment & Plan:  Acute on chronic respiratory failure with hypoxia/hypercarbia s/p trach Acute COPD exacerbation in the setting of RSV pneumonia - back on full MV support today after 25hrs on ATC due to increased WOB/ desaturations.  TV initially only around 200 going back on MV this am.  Very diminished.   - CXR today - apparently was not confused prior to going on full support, lethargic but able to answer all questions appropriately with PMV, probable some component of hypercarbia.  Is on home nocturnal NIV therefore will need some nocturnal support- full vs PSV  - VAP prevention protocol - PAD protocol-not currently needing sedation, enteral as below - ongoing trach care - solumedrol '40mg'$  today, then taper as able, home baseline of '10mg'$   - Brovana, Yupelri, Pulmicort plus xopenex as needed - TOC evaluating for placement/ LTAC  Hx HTN - Bps lower overnight, down to SBP 70's, looks more on dry side - hold demadex - metoprolol reduce 50-> '25mg'$  TID, w/ holding parameters   Chronic anxiety & depression - Continue Klonopin, Zyprexa, BuSpar  Hypokalemia, resolved - monitor periodically; does not need daily labs.  K 3.4 today, repeat in AM w/ mag  GERD - Pepcid  Breast cancer - Continue anastrozole  Acute/subacute L cerebellar and L frontal stroke - Aspirin, statin  Diabetes type 2, and hyperglycemia - remains higher, since restarting TF yest.  Fluctuating CBG, 83 then 170.  Hold off adding TF or increasing long acting for now unless continued hyperglycemia.  SSI PRN sensitive, cont semglee 8 units  - goal BG <180  Moderate malnutrition Plan: - TF per PEG -banatrol for diarrhea  CCT:  35 mins   Kennieth Rad, ANCP [place] [facility] 06/19/2022, 11:04 AM  See Amion for pager If no response to pager, please call PCCM consult pager After 7:00 pm call Elink

## 2022-06-20 DIAGNOSIS — Z9911 Dependence on respirator [ventilator] status: Secondary | ICD-10-CM | POA: Diagnosis not present

## 2022-06-20 DIAGNOSIS — J441 Chronic obstructive pulmonary disease with (acute) exacerbation: Secondary | ICD-10-CM | POA: Diagnosis not present

## 2022-06-20 DIAGNOSIS — Z930 Tracheostomy status: Secondary | ICD-10-CM | POA: Diagnosis not present

## 2022-06-20 DIAGNOSIS — J9621 Acute and chronic respiratory failure with hypoxia: Secondary | ICD-10-CM | POA: Diagnosis not present

## 2022-06-20 LAB — BASIC METABOLIC PANEL
Anion gap: 10 (ref 5–15)
BUN: 20 mg/dL (ref 8–23)
CO2: 35 mmol/L — ABNORMAL HIGH (ref 22–32)
Calcium: 9.2 mg/dL (ref 8.9–10.3)
Chloride: 94 mmol/L — ABNORMAL LOW (ref 98–111)
Creatinine, Ser: 0.44 mg/dL (ref 0.44–1.00)
GFR, Estimated: >60 mL/min (ref >60)
Glucose, Bld: 76 mg/dL (ref 70–99)
Potassium: 4 mmol/L (ref 3.5–5.1)
Sodium: 139 mmol/L (ref 135–145)

## 2022-06-20 LAB — GLUCOSE, CAPILLARY
Glucose-Capillary: 75 mg/dL (ref 70–99)
Glucose-Capillary: 160 mg/dL — ABNORMAL HIGH (ref 70–99)
Glucose-Capillary: 160 mg/dL — ABNORMAL HIGH (ref 70–99)
Glucose-Capillary: 162 mg/dL — ABNORMAL HIGH (ref 70–99)
Glucose-Capillary: 247 mg/dL — ABNORMAL HIGH (ref 70–99)

## 2022-06-20 LAB — MAGNESIUM: Magnesium: 2 mg/dL (ref 1.7–2.4)

## 2022-06-20 MED ORDER — METOPROLOL TARTRATE 12.5 MG HALF TABLET
12.5000 mg | ORAL_TABLET | Freq: Three times a day (TID) | ORAL | Status: DC
  Administered 2022-06-20 – 2022-06-25 (×15): 12.5 mg
  Filled 2022-06-20 (×16): qty 1

## 2022-06-20 NOTE — Progress Notes (Signed)
Physical Therapy Treatment Patient Details Name: Abigail Castillo MRN: [id number] DOB: 02/22/1951 Today's Date: 06/20/2022   History of Present Illness Abigail Castillo is a 72 y.o. F recently admitted for COPD flare, discharged to SNF who returned due to SpO2 65%. Found to be  RSV+, pCO2 80, started on BiPAP.   Head CT showing possible subacute infarct of the medial left cerebellar hemisphere. Failed bipap wean on 1/2, intubated. Tracheostomy 1/4. Pt with COPD and chronic respiratory failure on 2-3L home O2, depression/anxiety and hx BrCA    PT Comments    Focused on standing and transfer to chair as well as assessment of bilateral heel cord tightness. Pt did well with the [facility] and it allowed her to stand for longer period. In standing pt's feet flat and ankles to neutral dorsiflexion. Pt acknowledges stretching feeling in bil calves. Once in chair able to stretch ankles to neutral dorsiflexion. Pt encourage to perform ankle dorsiflex actively in chair and in bed. Likely more standing will continue to stretch heelcords and maintain functional ROM in ankles.    Recommendations for follow up therapy are one component of a multi-disciplinary discharge planning process, led by the attending physician.  Recommendations may be updated based on patient status, additional functional criteria and insurance authorization.  Follow Up Recommendations  PT at Long-term acute care hospital Can patient physically be transported by private vehicle: No   Assistance Recommended at Discharge Frequent or constant Supervision/Assistance  Patient can return home with the following A lot of help with bathing/dressing/bathroom;Two people to help with walking and/or transfers   [facility] bed;Wheelchair (measurements PT);Wheelchair cushion (measurements PT);Other (comment)    Recommendations for Other Services       Precautions / Restrictions Precautions Precautions: Fall Precaution  Comments: trach, vent vs trach collar Restrictions Weight Bearing Restrictions: No     Mobility  Bed Mobility Overal bed mobility: Needs Assistance Bed Mobility: Rolling, Sidelying to Sit Rolling: Supervision Sidelying to sit: Min assist, HOB elevated       General bed mobility comments: cues to use rail, pt reaching for therapist to assist with raising trunk.    Transfers Overall transfer level: Needs assistance Equipment used: Ambulation equipment used Transfers: Sit to/from Stand Sit to Stand: Min assist, +2 safety/equipment           General transfer comment: Assist to bring hips up and for balance. Sit to stand x 2. Stedy for bed to chair    Ambulation/Gait             Pre-gait activities: Stood with stedy and min guard assist for 30 sec and then 15 sec     Stairs             Wheelchair Mobility    Modified Rankin (Stroke Patients Only)       Balance Overall balance assessment: Needs assistance Sitting-balance support: No upper extremity supported, Feet supported Sitting balance-Leahy Scale: Fair     Standing balance support: Bilateral upper extremity supported, Reliant on assistive device for balance Standing balance-Leahy Scale: Poor Standing balance comment: min guard in stedy, fatigues easily                            Cognition Arousal/Alertness: Awake/alert Behavior During Therapy: Flat affect, Anxious Overall Cognitive Status: Difficult to assess  General Comments: anxious with shortness of breath        Exercises      General Comments General comments (skin integrity, edema, etc.): Pt on trach collar at 40%. Tried PMV but pt with incr RR and decr SpO2 with activity so removed. After getting to chair pt with tripod position and several minutes for SpO2 >90%      Pertinent Vitals/Pain Pain Assessment Pain Assessment: Faces Faces Pain Scale: No hurt    Home  Living                          Prior Function            PT Goals (current goals can now be found in the care plan section) Acute Rehab PT Goals PT Goal Formulation: With patient Time For Goal Achievement: 07/04/22 Potential to Achieve Goals: Fair Progress towards PT goals: Progressing toward goals;Goals met and updated - see care plan    Frequency    Min 2X/week      PT Plan Current plan remains appropriate    Co-evaluation PT/OT/SLP Co-Evaluation/Treatment: Yes Reason for Co-Treatment: For patient/therapist safety PT goals addressed during session: Mobility/safety with mobility OT goals addressed during session: Strengthening/ROM      AM-PAC PT "6 Clicks" Mobility   Outcome Measure  Help needed turning from your back to your side while in a flat bed without using bedrails?: A Little Help needed moving from lying on your back to sitting on the side of a flat bed without using bedrails?: A Little Help needed moving to and from a bed to a chair (including a wheelchair)?: Total Help needed standing up from a chair using your arms (e.g., wheelchair or bedside chair)?: A Little Help needed to walk in hospital room?: Total Help needed climbing 3-5 steps with a railing? : Total 6 Click Score: 12    End of Session Equipment Utilized During Treatment: Oxygen Activity Tolerance: Other (comment) (Limited by dyspnea) Patient left: in chair;with call bell/phone within reach;with nursing/sitter in room Nurse Communication: Mobility status;Need for lift equipment (Nurse present for transfer) PT Visit Diagnosis: Difficulty in walking, not elsewhere classified (R26.2);Unsteadiness on feet (R26.81);Other abnormalities of gait and mobility (R26.89);Muscle weakness (generalized) (M62.81)     Time: 7793-9030 PT Time Calculation (min) (ACUTE ONLY): 18 min  Charges:                        [place] [address] 06/20/2022, 5:07 PM

## 2022-06-20 NOTE — Progress Notes (Signed)
Occupational Therapy Treatment Patient Details Name: EDWARDINE DESCHEPPER MRN: [id number] DOB: June 30, 1950 Today's Date: 06/20/2022   History of present illness Mrs. Peltz is a 72 y.o. F recently admitted for COPD flare, discharged to SNF who returned due to SpO2 65%. Found to be  RSV+, pCO2 80, started on BiPAP.   Head CT showing possible subacute infarct of the medial left cerebellar hemisphere. Failed bipap wean on 1/2, intubated. Tracheostomy 1/4. Pt with COPD and chronic respiratory failure on 2-3L home O2, depression/anxiety and hx BrCA   OT comments  Pt readily willing to work on [place] to chair. Used stedy with min assist and second person for safety and lines. Pt maintained standing for pericare and to doff mesh panties (total assist). Pt unable to tolerate PSMV during exertion with increased WOB. In chair, pt rebounded to 93% on 12L 40% FiO2 with seated rest break. Pt propping on UEs and unable to participate in further activity. Discharge plan remains appropriate.   Recommendations for follow up therapy are one component of a multi-disciplinary discharge planning process, led by the attending physician.  Recommendations may be updated based on patient status, additional functional criteria and insurance authorization.    Follow Up Recommendations  OT at Long-term acute care hospital     Assistance Recommended at Discharge Frequent or constant Supervision/Assistance  Patient can return home with the following  Two people to help with walking and/or transfers;Two people to help with bathing/dressing/bathroom;Assistance with feeding;Direct supervision/assist for medications management;Assistance with cooking/housework;Direct supervision/assist for financial management;Assist for transportation;Help with stairs or ramp for entrance   Equipment Recommendations  Other (comment) (defer to next venue)    Recommendations for Other Services      Precautions / Restrictions  Precautions Precautions: Fall Precaution Comments: trach, vent vs trach collar Restrictions Weight Bearing Restrictions: No       Mobility Bed Mobility Overal bed mobility: Needs Assistance Bed Mobility: Rolling, Sidelying to Sit Rolling: Supervision Sidelying to sit: Min assist, HOB elevated       General bed mobility comments: cues to use rail, pt reaching for therapist to assist with raising trunk    Transfers Overall transfer level: Needs assistance Equipment used: Ambulation equipment used Transfers: Sit to/from Stand Sit to Stand: Min assist           General transfer comment: light min assist to stand with stedy second person for lines and safety     Balance Overall balance assessment: Needs assistance   Sitting balance-Leahy Scale: Fair       Standing balance-Leahy Scale: Poor Standing balance comment: min guard in stedy, fatigues easily                           ADL either performed or assessed with clinical judgement   ADL Overall ADL's : Needs assistance/impaired                     Lower Body Dressing: Total assistance;Sit to/from stand       Toileting- Water quality scientist and Hygiene: Total assistance;Bed level              Extremity/Trunk Assessment              Vision       Perception     Praxis      Cognition Arousal/Alertness: Awake/alert Behavior During Therapy: Flat affect, Anxious Overall Cognitive Status: Difficult to assess  General Comments: anxious with shortness of breath        Exercises      Shoulder Instructions       General Comments      Pertinent Vitals/ Pain       Pain Assessment Pain Assessment: Faces Faces Pain Scale: No hurt  Home Living                                          Prior Functioning/Environment              Frequency  Min 2X/week        Progress Toward Goals  OT  Goals(current goals can now be found in the care plan section)  Progress towards OT goals: Progressing toward goals  Acute Rehab OT Goals OT Goal Formulation: With patient Time For Goal Achievement: 06/25/22 Potential to Achieve Goals: [place] Discharge plan remains appropriate    Co-evaluation    PT/OT/SLP Co-Evaluation/Treatment: Yes Reason for Co-Treatment: For patient/therapist safety   OT goals addressed during session: Strengthening/ROM      AM-PAC OT "6 Clicks" Daily Activity     Outcome Measure   Help from another person eating meals?: Total Help from another person taking care of personal grooming?: A Lot Help from another person toileting, which includes using toliet, bedpan, or urinal?: Total Help from another person bathing (including washing, rinsing, drying)?: A Lot Help from another person to put on and taking off regular upper body clothing?: A Lot Help from another person to put on and taking off regular lower body clothing?: A Lot 6 Click Score: 10    End of Session Equipment Utilized During Treatment: Oxygen  OT Visit Diagnosis: Muscle weakness (generalized) (M62.81)   Activity Tolerance Patient limited by fatigue   Patient Left with call bell/phone within reach;in chair   Nurse Communication Mobility status;Need for lift equipment        Time: 0300-9233 OT Time Calculation (min): 19 min  Charges: OT General Charges $OT Visit: 1 Visit OT Treatments $Therapeutic Activity: 8-22 mins  Cleta Alberts, OTR/L Acute Rehabilitation Services Office: [phone]   [name] 06/20/2022, 4:25 PM

## 2022-06-20 NOTE — Progress Notes (Signed)
NAME:  Abigail Castillo, MRN:  [id number], DOB:  1950/07/06, LOS: 33 ADMISSION DATE:  05/19/2022, CONSULTATION DATE:  05/20/22 REFERRING MD:  Roger Shelter CHIEF COMPLAINT:  Dyspnea   History of Present Illness:  Abigail Castillo is a 72 y.o. female who has a PMH as below including but not limited to COPD on chronic 2-3L O2 and '10mg'$  Prednisone, OSA on nocturnal BiPAP, and who is followed by Dr. Lamonte Sakai in our office. She had recent admission 11/15 through 12/18 for AECOPD and was then discharged to [facility].  She was recently seen as an outpatient by Dr. Lamonte Sakai on 12/22 and was prescribed a slow prednisone taper with instructions to drop to '10mg'$  and continue until her next follow up appointment.  She presented back to [facility] ED 12/25 with dyspnea and hypoxia down to 65%. She was found to have hypoxic and hypercapnic respiratory failure and her RSV swab returned positive. She was placed on BiPAP and was admitted by [facility].  On 12/26, PCCM was consulted for assistance with BiPAP and COPD management.  Pertinent  Medical History:  has History of tobacco abuse; Depression; COPD (chronic obstructive pulmonary disease) ([place]); Osteoporosis; HOARSENESS, CHRONIC; HYPERTRIGLYCERIDEMIA; Memory change; B12 deficiency; Personal history of colonic polyps; Encounter for routine gynecological examination; Mild hyperlipidemia; Vitamin D deficiency; Esophageal stricture; GERD (gastroesophageal reflux disease); Schizoaffective disorder ([place]); Allergic rhinitis; Chronic respiratory failure ([place]); Acute on chronic respiratory failure with hypoxia ([place]); Elevated blood pressure reading without diagnosis of hypertension; Hepatic steatosis; Ductal carcinoma in situ (DCIS) of left breast; On home oxygen therapy; Hyponatremia; Bronchomalacia; Localized edema; COPD exacerbation ([place]); Tracheobronchomalacia; Acute on chronic respiratory failure with hypoxia and hypercapnia (HCC); COPD with acute exacerbation ([place]); RSV (respiratory syncytial  virus pneumonia); Essential hypertension; Malnutrition of moderate degree; Acute respiratory failure with hypercapnia ([place]); Pressure injury of skin; and Tracheostomy in place [facility]) on their problem list.  Significant Hospital Events: Including procedures, antibiotic start and stop dates in addition to other pertinent events   12/25 admit. 12/26 PCCM consult. 1/2 Failed BiPAP wean, Intubated 1/3 CT Head negative was completed for observed pupillary change 1/4 tracheostomy 1/8 CT head for anisocoria; neg,  1/11 continuing progressive trach collar trials 1/12 BID trach collar trials, appears comfortable  1/14 failed PSV 1/15 pulse prednisone at 40 mg continues to fail SBT trials LTAC denied awaiting vent SNF 1/16 tolerated 1 hr atc. ASA placed on hold at request of IR for PEG.  1/23 PEG placement 1/25 placed back on full support for fatigue/ increased WOB after ATC x 25hrs, softer BPs  Interim History / Subjective:  Continued soft Bps, lopressor held Rested on full support overnight No complaints   Objective:  Blood pressure 109/76, pulse 98, temperature 97.9 F (36.6 C), resp. rate 19, height '5\' 2"'$  (1.575 m), weight 59.6 kg, SpO2 95 %.    Vent Mode: PSV;CPAP FiO2 (%):  [40 %] 40 % Set Rate:  [18 bmp] 18 bmp Vt Set:  [400 mL] 400 mL PEEP:  [5 cmH20] 5 cmH20 Pressure Support:  [10 cmH20] 10 cmH20 Plateau Pressure:  [19 cmH20-20 cmH20] 20 cmH20   Intake/Output Summary (Last 24 hours) at 06/20/2022 0908 Last data filed at 06/20/2022 0400 Gross per 24 hour  Intake 1057 ml  Output 1327 ml  Net -270 ml   Filed Weights   06/17/22 0409 06/19/22 0419 06/20/22 0400  Weight: 61.7 kg 62 kg 59.6 kg   Physical Examination: General:  chronically ill appearing older female sitting upright in bed in  NAD, looks great today HEENT: MM pink/moist, edentulous, midline trach site wnl Neuro: awake, f/c, MAE- general weakness CV: rr, NSR, no murmur PULM:  on ATC, non labored, clear and  diminished, scant pale tan secretions GI: soft, bs+, NT, peg site wnl, purwick Extremities: warm/dry, no LE edema  Skin: no rashes   UOP 1.3 ml/ 24rs plus one unmeasured  Afebrile BM x 3 overnight type 6  Labs reviewed, BMET stable, Mag 2  Resolved Hospital problems:  Hypernatremia  Assessment & Plan:  Acute on chronic respiratory failure with hypoxia/hypercarbia s/p trach Acute COPD exacerbation in the setting of RSV pneumonia - cont daily ATC trials and nightly PSV prn and mandatory qHS PSV vs full support as she is on home NIV and will cont to need nightly support  - cont solumedrol and taper as tolerated (either back to baseline of '10mg'$  or trial to see if able to tolerate weaning off while inpt) - trach care - Brovana, Yupelri, Pulmicort plus xopenex as needed - TOC evaluating for placement/ LTAC - aggressive pulm hygiene> PT/ mobilize  - SLP  Hx HTN - remains on the lower side but MAPs remain ok - decreased metoprolol to 12.'5mg'$  TID w/ holding parameters - cont to hold demadex - consider fluid bolus for sustained hypotension - tele monitoring  Chronic anxiety & depression - cont Klonopin, Zyprexa, BuSpar  Hypokalemia, resolved - trend BMET periodically   GERD - pepcid   Breast cancer - cont anastrozole  Acute/subacute L cerebellar and L frontal stroke - cont aspirin, statin  Diabetes type 2, and hyperglycemia - cont SSI prn w/ semglee 8units daily  - goal BG <180  Moderate malnutrition Plan: - TF per PEG - banatrol for diarrhea, adding prn imodium   CCT:  35 mins   Kennieth Rad, ANCP [place] [facility] 06/20/2022, 9:08 AM  See Amion for pager If no response to pager, please call PCCM consult pager After 7:00 pm call Elink

## 2022-06-20 NOTE — Progress Notes (Signed)
Speech Language Pathology Treatment: [name] Speaking valve  Patient Details Name: Abigail Castillo MRN: [id number] DOB: 11-12-1950 Today's Date: 06/20/2022 Time: 7622-6333 SLP Time Calculation (min) (ACUTE ONLY): 12 min  Assessment / Plan / Recommendation Clinical Impression  Pt tolerated 24 hours on TC 1/24, but required placement on vent for the majority of the day 1/25 and over night. Pt back on TC today and SpO2 at baseline was at 94% prior to placing the valve. Cuff was deflated at baseline. Pt tolerated wearing the valve for ~12 minutes while following commands to complete an oral motor exam and trials of one ice cube, taking breaks intermittently as SLP noted decreased SpO2 (85%-90%). She appeared to have increased WOB as trials progressed. When SpO2 hit 85% valve was kept off and RN came to provide suction. Pt's vocal quality appeared low and hoarse, but she did not require cues to use a louder volume for intelligibility. Question pt's ability to coordinate wearing the valve for long periods of time while speaking, swallowing, and following commands as she appears to fatigue quickly. Recommend continuing to wear the PMV intermittently as respiratory status allows with full supervision.    HPI HPI: Abigail Castillo is a 72 y.o. F recently admitted for COPD flare, discharged to SNF who returned 12/25 due to SpO2 65%. Found to be RSV+, pCO2 80, started on BiPAP. Head CT showing possible subacute infarct of the medial left cerebellar hemisphere. Initial bedside swallow eval 1/1 WFL. Failed bipap wean on 1/2, intubated. Tracheostomy 1/4. Pt with COPD and chronic respiratory failure on 2-3L home O2, depression/anxiety and hx BrCA      SLP Plan  Continue with current plan of care      Recommendations for follow up therapy are one component of a multi-disciplinary discharge planning process, led by the attending physician.  Recommendations may be updated based on patient status, additional  functional criteria and insurance authorization.    Recommendations         Patient may use Passy-Muir Speech Valve: During all therapies with supervision;Intermittently with supervision PMSV Supervision: Full         Oral Care Recommendations: Oral care QID Follow Up Recommendations: SLP at Long-term acute care hospital Assistance recommended at discharge: Frequent or constant Supervision/Assistance SLP Visit Diagnosis: Aphonia (R49.1) Plan: Continue with current plan of care           Fabio Asa., Student SLP  06/20/2022, 12:10 PM

## 2022-06-20 NOTE — Evaluation (Signed)
Clinical/Bedside Swallow Evaluation Patient Details  Name: Abigail Castillo MRN: [id number] Date of Birth: [birth year]  Today's Date: 06/20/2022 Time: SLP Start Time (ACUTE ONLY): 22 SLP Stop Time (ACUTE ONLY): 1045 SLP Time Calculation (min) (ACUTE ONLY): 8 min  Past Medical History:  Past Medical History:  Diagnosis Date   Anxiety    Blood transfusion    Blood transfusion without reported diagnosis    Breast cancer ([place]) 05/13/2021   Chronic mental illness    COPD (chronic obstructive pulmonary disease) ([place])    DEPRESSION 05/31/2009   Esophageal stricture 03/14/2015   GERD (gastroesophageal reflux disease)    Hepatic steatosis    Osteoporosis    Oxygen dependent    on O2 at 2-3L/min 24hr/day   Tobacco abuse    ongoing   Vitamin B 12 deficiency    Vitamin D deficiency    Past Surgical History:  Past Surgical History:  Procedure Laterality Date   ABDOMINAL HYSTERECTOMY     BREAST EXCISIONAL BIOPSY Left 2017   BREAST LUMPECTOMY  1972   left breast   BREAST LUMPECTOMY WITH RADIOACTIVE SEED LOCALIZATION Left 11/22/2015   Procedure: LEFT BREAST LUMPECTOMY WITH RADIOACTIVE SEED LOCALIZATION;  Surgeon: Erroll Luna, MD;  Location: [place] [facility];  Service: General;  Laterality: Left;   BREAST LUMPECTOMY WITH RADIOACTIVE SEED LOCALIZATION Left 07/03/2021   Procedure: LEFT BREAST LUMPECTOMY WITH RADIOACTIVE SEED LOCALIZATION;  Surgeon: Erroll Luna, MD;  Location: [facility];  Service: General;  Laterality: Left;   BREAST SURGERY  ?2015   lumpectomy, left   CERVICAL CONE BIOPSY     CHOLECYSTECTOMY     COLONOSCOPY     ESOPHAGOGASTRODUODENOSCOPY (EGD) WITH ESOPHAGEAL DILATION     IR GASTROSTOMY TUBE MOD SED  06/17/2022   UPPER GASTROINTESTINAL ENDOSCOPY     HPI:  Mrs. Adamec is a 72 y.o. F recently admitted for COPD flare, discharged to SNF who returned 12/25 due to SpO2 65%. Found to be RSV+, pCO2 80, started on BiPAP. Head CT showing possible subacute infarct of the medial left  cerebellar hemisphere. Initial bedside swallow eval 1/1 WFL. Failed bipap wean on 1/2, intubated. Tracheostomy 1/4. Pt with COPD and chronic respiratory failure on 2-3L home O2, depression/anxiety and hx BrCA    Assessment / Plan / Recommendation  Clinical Impression  PMV was placed intermittently during evaluation as pt's respiratory status allowed. Oral motor exam WFL and pt states she does not typically wear dentures to eat. Pt tried one ice chip and SLP noted decreased SpO2 and increased WOB with audible secretions that were unable to be cleared with multiple cued  attempts to cough and required suction by nursing. Suspect respiratory status interferes with pt's ability to coordinate swallowing and breathing, impacted overall by her endurance. Will continue to f/u to assess for PO readiness as pt can have more consistency in using the PMV. SLP Visit Diagnosis: Dysphagia, unspecified (R13.10)    Aspiration Risk  Moderate aspiration risk    Diet Recommendation NPO   Medication Administration: Via alternative means    Other  Recommendations Oral Care Recommendations: Oral care QID    Recommendations for follow up therapy are one component of a multi-disciplinary discharge planning process, led by the attending physician.  Recommendations may be updated based on patient status, additional functional criteria and insurance authorization.  Follow up Recommendations SLP at Long-term acute care hospital      Assistance Recommended at Discharge Frequent or constant Supervision/Assistance  Functional Status Assessment Patient has had a recent  decline in their functional status and demonstrates the ability to make significant improvements in function in a reasonable and predictable amount of time.  Frequency and Duration min 2x/week  2 weeks       Prognosis Prognosis for Safe Diet Advancement: Good Barriers to Reach Goals: Time post onset      Swallow Study   General HPI: Mrs. Puopolo is  a 72 y.o. F recently admitted for COPD flare, discharged to SNF who returned 12/25 due to SpO2 65%. Found to be RSV+, pCO2 80, started on BiPAP. Head CT showing possible subacute infarct of the medial left cerebellar hemisphere. Initial bedside swallow eval 1/1 WFL. Failed bipap wean on 1/2, intubated. Tracheostomy 1/4. Pt with COPD and chronic respiratory failure on 2-3L home O2, depression/anxiety and hx BrCA Type of Study: Bedside Swallow Evaluation Previous Swallow Assessment: none Diet Prior to this Study: NPO;PEG tube Temperature Spikes Noted: No Respiratory Status: Trach Collar History of Recent Intubation: No Behavior/Cognition: Alert;Cooperative;Pleasant mood Oral Cavity Assessment: Within Functional Limits Oral Care Completed by SLP: No Oral Cavity - Dentition: Edentulous (pt stated she does not typically wear dentures) Vision: Functional for self-feeding Self-Feeding Abilities: Needs assist Patient Positioning: Upright in bed Baseline Vocal Quality: Low vocal intensity;Hoarse Volitional Cough: Weak Volitional Swallow: Able to elicit    Oral/Motor/Sensory Function Overall Oral Motor/Sensory Function: Within functional limits   Ice Chips Ice chips: Impaired Presentation: Spoon Pharyngeal Phase Impairments: Change in Vital Signs Other Comments: SpO2 decreased and break from PMV was provided. Note increased secretions that were not able to be cleared by multiple attempts to cough and required suction performed by RN.   Thin Liquid Thin Liquid: Not tested    Nectar Thick Nectar Thick Liquid: Not tested   Honey Thick Honey Thick Liquid: Not tested   Puree Puree: Not tested   Solid     Solid: Not tested      Fabio Asa., Student SLP  06/20/2022,12:47 PM

## 2022-06-21 DIAGNOSIS — Z930 Tracheostomy status: Secondary | ICD-10-CM | POA: Diagnosis not present

## 2022-06-21 DIAGNOSIS — J441 Chronic obstructive pulmonary disease with (acute) exacerbation: Secondary | ICD-10-CM | POA: Diagnosis not present

## 2022-06-21 DIAGNOSIS — J9621 Acute and chronic respiratory failure with hypoxia: Secondary | ICD-10-CM | POA: Diagnosis not present

## 2022-06-21 DIAGNOSIS — Z9911 Dependence on respirator [ventilator] status: Secondary | ICD-10-CM | POA: Diagnosis not present

## 2022-06-21 LAB — BASIC METABOLIC PANEL
Anion gap: 11 (ref 5–15)
BUN: 22 mg/dL (ref 8–23)
CO2: 33 mmol/L — ABNORMAL HIGH (ref 22–32)
Calcium: 9.7 mg/dL (ref 8.9–10.3)
Chloride: 96 mmol/L — ABNORMAL LOW (ref 98–111)
Creatinine, Ser: 0.41 mg/dL — ABNORMAL LOW (ref 0.44–1.00)
GFR, Estimated: >60 mL/min (ref >60)
Glucose, Bld: 59 mg/dL — ABNORMAL LOW (ref 70–99)
Potassium: 4.3 mmol/L (ref 3.5–5.1)
Sodium: 140 mmol/L (ref 135–145)

## 2022-06-21 LAB — GLUCOSE, CAPILLARY
Glucose-Capillary: 104 mg/dL — ABNORMAL HIGH (ref 70–99)
Glucose-Capillary: 155 mg/dL — ABNORMAL HIGH (ref 70–99)
Glucose-Capillary: 168 mg/dL — ABNORMAL HIGH (ref 70–99)
Glucose-Capillary: 173 mg/dL — ABNORMAL HIGH (ref 70–99)
Glucose-Capillary: 176 mg/dL — ABNORMAL HIGH (ref 70–99)
Glucose-Capillary: 75 mg/dL (ref 70–99)

## 2022-06-21 NOTE — Progress Notes (Signed)
NAME:  Abigail Castillo, MRN:  [id number], DOB:  [birth year], LOS: 58 ADMISSION DATE:  05/19/2022, CONSULTATION DATE:  05/20/22 REFERRING MD:  Roger Shelter CHIEF COMPLAINT:  Dyspnea   History of Present Illness:  Abigail Castillo is a 72 y.o. female who has a PMH as below including but not limited to COPD on chronic 2-3L O2 and '10mg'$  Prednisone, OSA on nocturnal BiPAP, and who is followed by Dr. Lamonte Sakai in our office. She had recent admission 11/15 through 12/18 for AECOPD and was then discharged to [facility].  She was recently seen as an outpatient by Dr. Lamonte Sakai on 12/22 and was prescribed a slow prednisone taper with instructions to drop to '10mg'$  and continue until her next follow up appointment.  She presented back to [facility] ED 12/25 with dyspnea and hypoxia down to 65%. She was found to have hypoxic and hypercapnic respiratory failure and her RSV swab returned positive. She was placed on BiPAP and was admitted by [facility].  On 12/26, PCCM was consulted for assistance with BiPAP and COPD management.  Pertinent  Medical History:  has History of tobacco abuse; Depression; COPD (chronic obstructive pulmonary disease) ([place]); Osteoporosis; HOARSENESS, CHRONIC; HYPERTRIGLYCERIDEMIA; Memory change; B12 deficiency; Personal history of colonic polyps; Encounter for routine gynecological examination; Mild hyperlipidemia; Vitamin D deficiency; Esophageal stricture; GERD (gastroesophageal reflux disease); Schizoaffective disorder ([place]); Allergic rhinitis; Chronic respiratory failure ([place]); Acute on chronic respiratory failure with hypoxia ([place]); Elevated blood pressure reading without diagnosis of hypertension; Hepatic steatosis; Ductal carcinoma in situ (DCIS) of left breast; On home oxygen therapy; Hyponatremia; Bronchomalacia; Localized edema; COPD exacerbation ([place]); Tracheobronchomalacia; Acute on chronic respiratory failure with hypoxia and hypercapnia (HCC); COPD with acute exacerbation ([place]); RSV (respiratory syncytial  virus pneumonia); Essential hypertension; Malnutrition of moderate degree; Acute respiratory failure with hypercapnia ([place]); Pressure injury of skin; and Tracheostomy in place [facility]) on their problem list.  Significant Hospital Events: Including procedures, antibiotic start and stop dates in addition to other pertinent events   12/25 admit. 12/26 PCCM consult. 1/2 Failed BiPAP wean, Intubated 1/3 CT Head negative was completed for observed pupillary change 1/4 tracheostomy 1/8 CT head for anisocoria; neg,  1/11 continuing progressive trach collar trials 1/12 BID trach collar trials, appears comfortable  1/14 failed PSV 1/15 pulse prednisone at 40 mg continues to fail SBT trials LTAC denied awaiting vent SNF 1/16 tolerated 1 hr atc. ASA placed on hold at request of IR for PEG.  1/23 PEG placement 1/25 placed back on full support for fatigue/ increased WOB after ATC x 25hrs, softer Bps 1/27 was able to tolerate trach collar for better part of prior day.  Rested at nighttime.  Interim History / Subjective:  Continues to tolerate daytime trach collar trials.  Resting on ventilator at night.  Using Passy-Muir valve.  Was able to mobilize with lift to chair.  Objective:  Blood pressure 101/72, pulse 77, temperature 98.3 F (36.8 C), temperature source Oral, resp. rate 20, height '5\' 2"'$  (1.575 m), weight 59.3 kg, SpO2 99 %.    Vent Mode: CPAP;PSV FiO2 (%):  [40 %] 40 % Set Rate:  [18 bmp] 18 bmp Vt Set:  [400 mL] 400 mL PEEP:  [5 cmH20] 5 cmH20 Pressure Support:  [12 cmH20] 12 cmH20   Intake/Output Summary (Last 24 hours) at 06/21/2022 1330 Last data filed at 06/21/2022 1100 Gross per 24 hour  Intake 504 ml  Output 1226 ml  Net -722 ml    Filed Weights   06/19/22 0419 06/20/22 0400  06/21/22 0500  Weight: 62 kg 59.6 kg 59.3 kg   Physical Examination: General:  chronically ill appearing older female sitting upright in bed in NAD, looks great today HEENT: MM pink/moist, edentulous,  midline trach site wnl Neuro: awake, f/c, MAE- general weakness CV: rr, NSR, no murmur PULM:  on ATC, non labored, clear but diminished bilaterally.  GI: soft, bs+, NT, peg site wnl, purwick Extremities: warm/dry, no LE edema  Skin: no rashes   Ancillary tests personally reviewed  CO2 33   Assessment & Plan:  Acute on chronic respiratory failure with hypoxia/hypercarbia s/p trach Acute COPD exacerbation in the setting of RSV pneumonia - cont daily ATC trials and nightly mechanical ventilation. - would keep on nocturnal ventilation until she is more mobile. - cont prednisone  taper as tolerated (either back to baseline of '10mg'$  or trial to see if able to tolerate weaning off while inpt) - trach care - Brovana, Yupelri, Pulmicort plus xopenex as needed - TOC evaluating for placement/ LTAC - aggressive pulm hygiene> PT/ mobilize  - SLP  Hx HTN - remains on the lower side but MAPs remain ok - decreased metoprolol to 12.'5mg'$  TID w/ holding parameters -Diuresis as needed  Chronic anxiety & depression - cont Klonopin, Zyprexa, BuSpar  Hypokalemia, resolved - trend BMET periodically   GERD - pepcid   Breast cancer - cont anastrozole  Acute/subacute L cerebellar and L frontal stroke - cont aspirin, statin  Diabetes type 2, and hyperglycemia - cont SSI prn w/ semglee 8units daily  - goal BG <180  Moderate malnutrition Plan: - TF per PEG - banatrol for diarrhea, adding prn imodium   Kipp Brood, MD [facility] ICU Physician [place]  Pager: [phone] Or Epic Secure Chat After hours: [phone].  06/21/2022, 2:02 PM

## 2022-06-22 DIAGNOSIS — Z930 Tracheostomy status: Secondary | ICD-10-CM | POA: Diagnosis not present

## 2022-06-22 DIAGNOSIS — J441 Chronic obstructive pulmonary disease with (acute) exacerbation: Secondary | ICD-10-CM | POA: Diagnosis not present

## 2022-06-22 DIAGNOSIS — J9621 Acute and chronic respiratory failure with hypoxia: Secondary | ICD-10-CM | POA: Diagnosis not present

## 2022-06-22 DIAGNOSIS — Z9911 Dependence on respirator [ventilator] status: Secondary | ICD-10-CM | POA: Diagnosis not present

## 2022-06-22 LAB — GLUCOSE, CAPILLARY
Glucose-Capillary: 120 mg/dL — ABNORMAL HIGH (ref 70–99)
Glucose-Capillary: 179 mg/dL — ABNORMAL HIGH (ref 70–99)
Glucose-Capillary: 203 mg/dL — ABNORMAL HIGH (ref 70–99)
Glucose-Capillary: 240 mg/dL — ABNORMAL HIGH (ref 70–99)
Glucose-Capillary: 74 mg/dL (ref 70–99)
Glucose-Capillary: 84 mg/dL (ref 70–99)

## 2022-06-22 NOTE — Progress Notes (Signed)
NAME:  Abigail Castillo, MRN:  [id number], DOB:  April 01, 1951, LOS: 54 ADMISSION DATE:  05/19/2022, CONSULTATION DATE:  05/20/22 REFERRING MD:  Roger Shelter CHIEF COMPLAINT:  Dyspnea   History of Present Illness:  Abigail Castillo is a 72 y.o. female who has a PMH as below including but not limited to COPD on chronic 2-3L O2 and '10mg'$  Prednisone, OSA on nocturnal BiPAP, and who is followed by Dr. Lamonte Sakai in our office. She had recent admission 11/15 through 12/18 for AECOPD and was then discharged to [facility].  She was recently seen as an outpatient by Dr. Lamonte Sakai on 12/22 and was prescribed a slow prednisone taper with instructions to drop to '10mg'$  and continue until her next follow up appointment.  She presented back to [facility] ED 12/25 with dyspnea and hypoxia down to 65%. She was found to have hypoxic and hypercapnic respiratory failure and her RSV swab returned positive. She was placed on BiPAP and was [facility]  On 12/26, PCCM was consulted for assistance with BiPAP and COPD management.  Pertinent  Medical History:  has History of tobacco abuse; Depression; COPD (chronic obstructive pulmonary disease) ([place]); Osteoporosis; HOARSENESS, CHRONIC; HYPERTRIGLYCERIDEMIA; Memory change; B12 deficiency; Personal history of colonic polyps; Encounter for routine gynecological examination; Mild hyperlipidemia; Vitamin D deficiency; Esophageal stricture; GERD (gastroesophageal reflux disease); Schizoaffective disorder ([place]); Allergic rhinitis; Chronic respiratory failure ([place]); Acute on chronic respiratory failure with hypoxia ([place]); Elevated blood pressure reading without diagnosis of hypertension; Hepatic steatosis; Ductal carcinoma in situ (DCIS) of left breast; On home oxygen therapy; Hyponatremia; Bronchomalacia; Localized edema; COPD exacerbation ([place]); Tracheobronchomalacia; Acute on chronic respiratory failure with hypoxia and hypercapnia (HCC); COPD with acute exacerbation ([place]); RSV (respiratory syncytial  virus pneumonia); Essential hypertension; Malnutrition of moderate degree; Acute respiratory failure with hypercapnia ([place]); Pressure injury of skin; and Tracheostomy in place [facility]) on their problem list.  Significant Hospital Events: Including procedures, antibiotic start and stop dates in addition to other pertinent events   12/25 admit. 12/26 PCCM consult. 1/2 Failed BiPAP wean, Intubated 1/3 CT Head negative was completed for observed pupillary change 1/4 tracheostomy 1/8 CT head for anisocoria; neg,  1/11 continuing progressive trach collar trials 1/12 BID trach collar trials, appears comfortable  1/14 failed PSV 1/15 pulse prednisone at 40 mg continues to fail SBT trials LTAC denied awaiting vent SNF 1/16 tolerated 1 hr atc. ASA placed on hold at request of IR for PEG.  1/23 PEG placement 1/25 placed back on full support for fatigue/ increased WOB after ATC x 25hrs, softer Bps 1/27 was able to tolerate trach collar for better part of prior day.  Rested at nighttime.  Stood with assistance  Interim History / Subjective:  Continues to tolerate daytime trach collar trials.  Resting on ventilator at night.  Using Passy-Muir valve.  Was able to stand with assistance.  Objective:  Blood pressure 105/69, pulse 86, temperature 97.8 F (36.6 C), temperature source Oral, resp. rate (!) 21, height '5\' 2"'$  (1.575 m), weight 59.3 kg, SpO2 100 %.    Vent Mode: PRVC FiO2 (%):  [40 %] 40 % Set Rate:  [18 bmp] 18 bmp Vt Set:  [400 mL] 400 mL PEEP:  [5 cmH20] 5 cmH20   Intake/Output Summary (Last 24 hours) at 06/22/2022 1533 Last data filed at 06/22/2022 1436 Gross per 24 hour  Intake 1014 ml  Output 550 ml  Net 464 ml    Filed Weights   06/19/22 0419 06/20/22 0400 06/21/22 0500  Weight:  62 kg 59.6 kg 59.3 kg   Physical Examination: General:  chronically ill appearing older female sitting upright in bed in NAD, looks great today HEENT: MM pink/moist, edentulous, midline trach site wnl.   Strong voice with Passy-Muir valve. Neuro: awake, f/c, MAE- general weakness 4/5 CV: rr, NSR, no murmur PULM:  on ATC, non labored, clear but diminished bilaterally.  GI: soft, bs+, NT, peg site wnl, purwick Extremities: warm/dry, no LE edema  Skin: no rashes   Ancillary tests personally reviewed  CO2 33   Assessment & Plan:  Acute on chronic respiratory failure with hypoxia/hypercarbia s/p trach Acute COPD exacerbation in the setting of RSV pneumonia Hx HTN Chronic anxiety & depression GERD Breast cancer Acute/subacute L cerebellar and L frontal stroke Diabetes type 2, and hyperglycemia Moderate malnutrition  Plan: - cont daily ATC trials and nightly mechanical ventilation. - would keep on nocturnal ventilation until she is more mobile. - cont prednisone  taper as tolerated (either back to baseline of '10mg'$  or trial to see if able to tolerate weaning off while inpt) - Brovana, Yupelri, Pulmicort plus xopenex as needed - TOC evaluating for placement/ LTAC  - SLP TF per PEG - banatrol for diarrhea, adding prn imodium -Diuresis as needed - cont Klonopin, Zyprexa, BuSpar - pepcid  - cont anastrozole - cont aspirin, statin - cont SSI prn w/ semglee 8units daily goal BG <180  Best Practice (right click and "Reselect all SmartList Selections" daily)   Diet/type: tubefeeds DVT prophylaxis: LMWH GI prophylaxis: H2B Lines: N/A Foley:  N/A Code Status:  full code Last date of multidisciplinary goals of care discussion [patient updated at bedside 1/28]  Kipp Brood, MD [facility] ICU Physician [place]  Pager: [phone] Or Epic Secure Chat After hours: [phone].  06/22/2022, 3:33 PM

## 2022-06-22 NOTE — Progress Notes (Signed)
RT placed patient on 40% ATC. Patient tolerating well at this time. RT will continue to monitor.

## 2022-06-23 DIAGNOSIS — J9621 Acute and chronic respiratory failure with hypoxia: Secondary | ICD-10-CM | POA: Diagnosis not present

## 2022-06-23 DIAGNOSIS — J9622 Acute and chronic respiratory failure with hypercapnia: Secondary | ICD-10-CM | POA: Diagnosis not present

## 2022-06-23 LAB — GLUCOSE, CAPILLARY
Glucose-Capillary: 116 mg/dL — ABNORMAL HIGH (ref 70–99)
Glucose-Capillary: 118 mg/dL — ABNORMAL HIGH (ref 70–99)
Glucose-Capillary: 155 mg/dL — ABNORMAL HIGH (ref 70–99)
Glucose-Capillary: 176 mg/dL — ABNORMAL HIGH (ref 70–99)
Glucose-Capillary: 183 mg/dL — ABNORMAL HIGH (ref 70–99)
Glucose-Capillary: 193 mg/dL — ABNORMAL HIGH (ref 70–99)
Glucose-Capillary: 83 mg/dL (ref 70–99)

## 2022-06-23 NOTE — Progress Notes (Signed)
Speech Language Pathology Treatment: Dysphagia;Passy Muir Speaking valve  Patient Details Name: Abigail Castillo MRN: [id number] DOB: 1950-12-03 Today's Date: 06/23/2022 Time: 0459-9774 SLP Time Calculation (min) (ACUTE ONLY): 22 min  Assessment / Plan / Recommendation Clinical Impression  Pt at baseline on 40% FiO2 TC after being placed on vent overnight and SpO2 was noted at 93%. Cuff was deflated at baseline. Pt wore the valve for ~11 minutes and produced vocalizations that were low and hoarse in quality. When valve was removed, mild secretions were audibly present, but were unable to be cleared by multiple cued attempts to cough. While wearing the valve, pt tried one ice chip with a noted decrease in SpO2 (85%). Valve was removed to allow for rest. RT was present in room and suggested increasing to 50% FiO2 which improved SpO2 (92%). Pt declined wanting to place the valve again or try more ice chips, stating that she would like to wait until tomorrow. Recommend f/u for additional PO trials as pt wears TC more consistently and builds her endurance. Continue wearing PMV with full supervision.    HPI HPI: Abigail Castillo is a 72 y.o. F recently admitted for COPD flare, discharged to SNF who returned 12/25 due to SpO2 65%. Found to be RSV+, pCO2 80, started on BiPAP. Head CT showing possible subacute infarct of the medial left cerebellar hemisphere. Initial bedside swallow eval 1/1 WFL. Failed bipap wean on 1/2, intubated. Tracheostomy 1/4. Pt with COPD and chronic respiratory failure on 2-3L home O2, depression/anxiety and hx BrCA      SLP Plan  Continue with current plan of care      Recommendations for follow up therapy are one component of a multi-disciplinary discharge planning process, led by the attending physician.  Recommendations may be updated based on patient status, additional functional criteria and insurance authorization.    Recommendations  Diet recommendations: NPO Medication  Administration: Via alternative means      Patient may use Passy-Muir Speech Valve: During all therapies with supervision;Intermittently with supervision PMSV Supervision: Full         Oral Care Recommendations: Oral care QID Follow Up Recommendations: SLP at Long-term acute care hospital Assistance recommended at discharge: Frequent or constant Supervision/Assistance SLP Visit Diagnosis: Dysphagia, unspecified (R13.10);Aphonia (R49.1) Plan: Continue with current plan of care           Fabio Asa., Student SLP  06/23/2022, 12:29 PM

## 2022-06-23 NOTE — Progress Notes (Signed)
Physical Therapy Treatment Patient Details Name: Abigail Castillo MRN: [id number] DOB: 02/11/1951 Today's Date: 06/23/2022   History of Present Illness Mrs. Top is a 72 y.o. F recently admitted for COPD flare, discharged to SNF who returned due to SpO2 65%. Found to be  RSV+, pCO2 80, started on BiPAP.   Head CT showing possible subacute infarct of the medial left cerebellar hemisphere. Failed bipap wean on 1/2, intubated. Tracheostomy 1/4. Pt with COPD and chronic respiratory failure on 2-3L home O2, depression/anxiety and hx BrCA    PT Comments    Pt making slow progress with mobility and activity tolerance. Pt mobilizing better each session but pulmonary status limits how much she can tolerate. Continue to recommend LTACH vs SNF at DC.    Recommendations for follow up therapy are one component of a multi-disciplinary discharge planning process, led by the attending physician.  Recommendations may be updated based on patient status, additional functional criteria and insurance authorization.  Follow Up Recommendations  PT at Long-term acute care hospital Can patient physically be transported by private vehicle: No   Assistance Recommended at Discharge Frequent or constant Supervision/Assistance  Patient can return home with the following A lot of help with bathing/dressing/bathroom;Two people to help with walking and/or transfers   [facility] bed;Wheelchair (measurements PT);Wheelchair cushion (measurements PT);Other (comment)    Recommendations for Other Services       Precautions / Restrictions Precautions Precautions: Fall Precaution Comments: trach, vent vs trach collar Restrictions Weight Bearing Restrictions: No     Mobility  Bed Mobility Overal bed mobility: Needs Assistance Bed Mobility: Rolling, Sidelying to Sit Rolling: Supervision Sidelying to sit: Min assist, HOB elevated       General bed mobility comments: Assist to elevate trunk  and bring hips to EOB    Transfers Overall transfer level: Needs assistance Equipment used: Ambulation equipment used Transfers: Sit to/from Stand Sit to Stand: Min assist, +2 safety/equipment           General transfer comment: Assist to bring hips up and for balance. Sit to stand x 2. Stedy for bed to chair Transfer via Lift Equipment: Stedy  Ambulation/Gait             Pre-gait activities: Stood with Stedy x 3 and min guard for 20 sec.     Stairs             Wheelchair Mobility    Modified Rankin (Stroke Patients Only)       Balance Overall balance assessment: Needs assistance Sitting-balance support: No upper extremity supported, Feet supported Sitting balance-Leahy Scale: Fair     Standing balance support: Bilateral upper extremity supported, Reliant on assistive device for balance Standing balance-Leahy Scale: Poor Standing balance comment: min guard in stedy, fatigues easily                            Cognition Arousal/Alertness: Awake/alert Behavior During Therapy: Flat affect, Anxious Overall Cognitive Status: Difficult to assess                                 General Comments: anxious with shortness of breath. Follows commands        Exercises General Exercises - Lower Extremity Ankle Circles/Pumps: AAROM, Both, Supine, Seated, 15 reps    General Comments General comments (skin integrity, edema, etc.): Pt on trach collar. SpO2 > 90%  throughout      Pertinent Vitals/Pain Pain Assessment Pain Assessment: No/denies pain    Home Living                          Prior Function            PT Goals (current goals can now be found in the care plan section) Acute Rehab PT Goals PT Goal Formulation: With patient Time For Goal Achievement: 07/04/22 Potential to Achieve Goals: Fair Progress towards PT goals: Progressing toward goals    Frequency    Min 2X/week      PT Plan Current plan  remains appropriate    Co-evaluation              AM-PAC PT "6 Clicks" Mobility   Outcome Measure  Help needed turning from your back to your side while in a flat bed without using bedrails?: A Little Help needed moving from lying on your back to sitting on the side of a flat bed without using bedrails?: A Little Help needed moving to and from a bed to a chair (including a wheelchair)?: Total Help needed standing up from a chair using your arms (e.g., wheelchair or bedside chair)?: A Little Help needed to walk in hospital room?: Total Help needed climbing 3-5 steps with a railing? : Total 6 Click Score: 12    End of Session Equipment Utilized During Treatment: Oxygen Activity Tolerance: Patient limited by fatigue (Limited by dyspnea) Patient left: in chair;with call bell/phone within reach;with chair alarm set Nurse Communication: Mobility status;Need for lift equipment (Nurse assist for transfer) PT Visit Diagnosis: Difficulty in walking, not elsewhere classified (R26.2);Unsteadiness on feet (R26.81);Other abnormalities of gait and mobility (R26.89);Muscle weakness (generalized) (M62.81)     Time: 0233-4356 PT Time Calculation (min) (ACUTE ONLY): 18 min  Charges:  $Therapeutic Activity: 8-22 mins                     Simpson 06/23/2022, 3:29 PM

## 2022-06-23 NOTE — Progress Notes (Signed)
06/23/2022  I have seen and evaluated the patient for vent f/u.  S:  No events, sleeping at time of my eval so I left her alone.  On TC.  O: Blood pressure 108/69, pulse (!) 101, temperature 97.8 F (36.6 C), resp. rate (!) 22, height '5\' 2"'$  (1.575 m), weight 59.3 kg, SpO2 100 %.  No distress Lungs clear PEG in place Sleeping  A:  Acute on chronic respiratory failure with hypoxia/hypercarbia s/p trach Acute COPD exacerbation in the setting of RSV pneumonia Hx HTN Chronic anxiety & depression GERD Breast cancer Acute/subacute L cerebellar and L frontal stroke Diabetes type 2, and hyperglycemia Moderate malnutrition  P:  - Wean prednisone to off completely (was on chronically at home) - Nebs, insulin as ordered - See how she does with indefinite trach collar, discussed with RT - Stable for transfer to vent SNF vs. LTACH - Check am BMP  Erskine Emery MD [facility] Prefer epic messenger for cross cover needs If after hours, please call E-link

## 2022-06-24 DIAGNOSIS — J9622 Acute and chronic respiratory failure with hypercapnia: Secondary | ICD-10-CM | POA: Diagnosis not present

## 2022-06-24 DIAGNOSIS — J9621 Acute and chronic respiratory failure with hypoxia: Secondary | ICD-10-CM | POA: Diagnosis not present

## 2022-06-24 LAB — BASIC METABOLIC PANEL
Anion gap: 8 (ref 5–15)
BUN: 19 mg/dL (ref 8–23)
CO2: 36 mmol/L — ABNORMAL HIGH (ref 22–32)
Calcium: 9.4 mg/dL (ref 8.9–10.3)
Chloride: 95 mmol/L — ABNORMAL LOW (ref 98–111)
Creatinine, Ser: 0.45 mg/dL (ref 0.44–1.00)
GFR, Estimated: >60 mL/min (ref >60)
Glucose, Bld: 91 mg/dL (ref 70–99)
Potassium: 4.5 mmol/L (ref 3.5–5.1)
Sodium: 139 mmol/L (ref 135–145)

## 2022-06-24 LAB — GLUCOSE, CAPILLARY
Glucose-Capillary: 95 mg/dL (ref 70–99)
Glucose-Capillary: 164 mg/dL — ABNORMAL HIGH (ref 70–99)
Glucose-Capillary: 210 mg/dL — ABNORMAL HIGH (ref 70–99)
Glucose-Capillary: 184 mg/dL — ABNORMAL HIGH (ref 70–99)
Glucose-Capillary: 140 mg/dL — ABNORMAL HIGH (ref 70–99)

## 2022-06-24 MED ORDER — BANATROL TF EN LIQD
60.0000 mL | Freq: Three times a day (TID) | ENTERAL | Status: DC
  Administered 2022-06-24 – 2022-07-04 (×30): 60 mL
  Filled 2022-06-24 (×30): qty 60

## 2022-06-24 NOTE — Progress Notes (Signed)
06/24/2022  I have seen and evaluated the patient for vent f/u.  S:  Awake, denies pain.  On TC all night.  O: Blood pressure 101/85, pulse 94, temperature (!) 97.4 F (36.3 C), temperature source Oral, resp. rate 17, height '5\' 2"'$  (1.575 m), weight 60.4 kg, SpO2 100 %.  No distress Lungs diminished but clear PEG in place Moves ext to command  A:  Acute on chronic respiratory failure with hypoxia/hypercarbia s/p trach Acute COPD exacerbation in the setting of RSV pneumonia Hx HTN Chronic anxiety & depression GERD Breast cancer Acute/subacute L cerebellar and L frontal stroke Diabetes type 2, and hyperglycemia Moderate malnutrition  P:  - Wean prednisone to off completely (was on chronically at home) - Nebs, insulin as ordered - Continue indefinite TC, if stays off tomorrow am can go to floor for ongoing care - Stable for transfer to vent SNF vs. LTACH - DC free water  Erskine Emery MD [facility] Prefer epic messenger for cross cover needs If after hours, please call E-link

## 2022-06-24 NOTE — Progress Notes (Signed)
Occupational Therapy Treatment Patient Details Name: KEIMORA SWARTOUT MRN: [id number] DOB: August 13, 1950 Today's Date: 06/24/2022   History of present illness Mrs. Joost is a 72 y.o. F recently admitted for COPD flare, discharged to SNF who returned due to SpO2 65%. Found to be  RSV+, pCO2 80, started on BiPAP.   Head CT showing possible subacute infarct of the medial left cerebellar hemisphere. Failed bipap wean on 1/2, intubated. Tracheostomy 1/4. Pt with COPD and chronic respiratory failure on 2-3L home O2, depression/anxiety and hx BrCA   OT comments  Goals updated. Sabre is making stedy progress, pt seen on 10L O2, 35% FiO2. SpO2 down to 80% during activity, HR to 130. RN present and extensive rest breaks required for vitals to recover. Overall she needed min A for bed mobility and mod A to stand in the stedy frame. Prolonged time needed in standing due to incontinent BM; dependent for pericare despite education and encouragement to participate. Pt left in chair, SpO2 >90%, HR 90s. OT to continue to follow. POC remains appropriate.    Recommendations for follow up therapy are one component of a multi-disciplinary discharge planning process, led by the attending physician.  Recommendations may be updated based on patient status, additional functional criteria and insurance authorization.    Follow Up Recommendations  OT at Long-term acute care hospital vs. SNF    Assistance Recommended at Discharge Frequent or constant Supervision/Assistance  Patient can return home with the following  Two people to help with walking and/or transfers;Two people to help with bathing/dressing/bathroom;Assistance with feeding;Direct supervision/assist for medications management;Assistance with cooking/housework;Direct supervision/assist for financial management;Assist for transportation;Help with stairs or ramp for entrance   Equipment Recommendations  Other (comment)       Precautions / Restrictions  Precautions Precautions: Fall Precaution Comments: trach, vent vs trach collar Restrictions Weight Bearing Restrictions: No       Mobility Bed Mobility Overal bed mobility: Needs Assistance Bed Mobility: Rolling, Sit to Sidelying Rolling: Min assist Sidelying to sit: Min assist       General bed mobility comments: cues needed    Transfers Overall transfer level: Needs assistance Equipment used: Ambulation equipment used Transfers: Sit to/from Stand Sit to Stand: Mod assist             Transfer via Lift Equipment: Stedy   Balance Overall balance assessment: Needs assistance Sitting-balance support: No upper extremity supported, Feet supported Sitting balance-Leahy Scale: Fair     Standing balance support: Bilateral upper extremity supported, Reliant on assistive device for balance Standing balance-Leahy Scale: Poor                             ADL either performed or assessed with clinical judgement   ADL Overall ADL's : Needs assistance/impaired                 Upper Body Dressing : Moderate assistance Upper Body Dressing Details (indicate cue type and reason): gown     Toilet Transfer: Total assistance Toilet Transfer Details (indicate cue type and reason): stedy this date. mod A to stand, dependent to transfer [place] and Hygiene: Total assistance;Sit to/from stand Toileting - Clothing Manipulation Details (indicate cue type and reason): dependent fro pericare in standing, encouraged pt to participate, seeminging ignorign cues     Functional mobility during ADLs: Moderate assistance General ADL Comments: tolerated prolonged sitting, standing and transferred to the chair. extensive rest breaks needed betwen each task  Extremity/Trunk Assessment Upper Extremity Assessment Upper Extremity Assessment: Generalized weakness   Lower Extremity Assessment Lower Extremity Assessment: Defer to PT evaluation         Vision   Vision Assessment?: No apparent visual deficits   Perception Perception Perception: Within Functional Limits   Praxis Praxis Praxis: Intact    Cognition Arousal/Alertness: Awake/alert Behavior During Therapy: Flat affect, Anxious Overall Cognitive Status: Difficult to assess                                 General Comments: wrote "I did not sleep well last night." Initially declining participation, however participated with encouragement. Pt states she enjoys reading and coloroing              General Comments pt on TC, 10L 35 FiO2, SpO2 down to 80%, RN present. HR to 130 activity    Pertinent Vitals/ Pain       Pain Assessment Pain Assessment: Faces Pain Location: grimaced with peri care Pain Descriptors / Indicators: Grimacing, Discomfort Pain Intervention(s): Limited activity within patient's tolerance, Monitored during session   Frequency  Min 2X/week        Progress Toward Goals  OT Goals(current goals can now be found in the care plan section)  Progress towards OT goals: Progressing toward goals  Acute Rehab OT Goals Patient Stated Goal: to sleep OT Goal Formulation: With patient Time For Goal Achievement: 07/09/22 Potential to Achieve Goals: Fair ADL Goals Pt Will Perform Grooming: with set-up;sitting Pt Will Perform Lower Body Bathing: with modified independence;sit to/from stand Pt Will Perform Upper Body Dressing: sitting;with min assist Pt Will Perform Lower Body Dressing: with modified independence;sit to/from stand Pt Will Transfer to Toilet: with modified independence;ambulating;bedside commode Pt Will Perform Toileting - Clothing Manipulation and hygiene: with modified independence;sit to/from stand Pt/caregiver will Perform Home Exercise Program: Increased strength;Both right and left upper extremity;With Supervision Additional ADL Goal #1: Pt will perform bed mobility with max assist in preparation for  ADLs. Additional ADL Goal #2: Pt will demonstrate fair sitting balance x 10 minutes in preparation for ADLs.  Plan Discharge plan remains appropriate       AM-PAC OT "6 Clicks" Daily Activity     Outcome Measure   Help from another person eating meals?: Total Help from another person taking care of personal grooming?: A Lot Help from another person toileting, which includes using toliet, bedpan, or urinal?: Total Help from another person bathing (including washing, rinsing, drying)?: A Lot Help from another person to put on and taking off regular upper body clothing?: A Lot Help from another person to put on and taking off regular lower body clothing?: Total 6 Click Score: 9    End of Session Equipment Utilized During Treatment: Oxygen  OT Visit Diagnosis: Muscle weakness (generalized) (M62.81)   Activity Tolerance Patient limited by fatigue   Patient Left with call bell/phone within reach;in chair   Nurse Communication Mobility status;Need for lift equipment        Time: 1478-2956 OT Time Calculation (min): 32 min  Charges: OT General Charges $OT Visit: 1 Visit OT Treatments $Self Care/Home Management : 23-37 mins    Elliot Cousin 06/24/2022, 5:21 PM

## 2022-06-24 NOTE — Progress Notes (Signed)
Nutrition Follow-up  DOCUMENTATION CODES:   Non-severe (moderate) malnutrition in context of acute illness/injury  INTERVENTION:   Tube Feeding via G-tube: Continue bolus feedings Osmolite 1.5 237 mL (1 carton) 5 times daily Pro-Source TF20 60 mL daily This provides 1855 kals, 95 g of protein and 905 mL of free water  Increase Banatrol TF to TID, each packet provides 5 g of soluble fiber  Free water flushes discontinued per MD today. Pt does receive some free water before and after each bolus feeding. Monitor hydration status, may need to resume some free water per tube  If CBGs remain an issue once off prednisone taper, consider Glucerna TF formula    NUTRITION DIAGNOSIS:   Moderate Malnutrition related to acute illness (COPD exacerbation) as evidenced by mild fat depletion, mild muscle depletion, percent weight loss.  Being addressed via PEG feedings  GOAL:   Patient will meet greater than or equal to 90% of their needs  Met via TF   MONITOR:   Diet advancement, TF tolerance, Skin, Labs, Weight trends  REASON FOR ASSESSMENT:   Consult Assessment of nutrition requirement/status  ASSESSMENT:   Pt admitted from [facility] with SOB r/t COPD exacerbation. PMH significant for breast cancer (2022), COPD on 2-3L home O2, depression/anxiety. Recently admitted 11/15-12/18 for COPD exacerbation.  Remains on trach collar.Pt is medically ready for discharge per MD, pt must have trach for at least 30 days before d/c to SNF. TOC team working on d/c planning  Noted SLP following; per SLP and RN, pt does not appear interested in wearing PMV or participating in swallow eval.  Pt is very flat. Remains NPO  Pt has been tolerating bolus feedings of Osmolite 1.5 via PEG  Free water flush of 200 mL q 8 hours discontinued by MD today  Current wt 60.4 kg; reviewed weights from this admission and appear relatively stable; admit wt 61.7 with wt ranging from 59 kg to 63 kg since  admit  No pressure injuries per RN skin assessment; some MASD to buttocks  No rectal tube, +stool. Bowel regimen scheduled prn. Receiving Banatrol TF BID   CBGs 83-210, currently on prednisone taper. Pt with ss novolog q 4 hours, semglee 8 units daily since 1/24  Labs: Creatinine wdl, sodium 139 (wdl) Meds: KCL, MVI   Diet Order:   Diet Order     None       EDUCATION NEEDS:   Not appropriate for education at this time  Skin:  Skin Assessment: Skin Integrity Issues: Skin Integrity Issues:: Other (Comment) Other: MASD to buttocks  Last BM:  1/30 type 6 small  Height:   Ht Readings from Last 1 Encounters:  06/12/22 '5\' 2"'$  (1.575 m)    Weight:   Wt Readings from Last 1 Encounters:  06/24/22 60.4 kg    BMI:  Body mass index is 24.35 kg/m.  Estimated Nutritional Needs:   Kcal:  1700-1900 kcal/d  Protein:  90-105  Fluid:  1.8-2 L/d    Kerman Passey MS, RDN, LDN, CNSC Registered Dietitian 3 Clinical Nutrition RD Pager and On-Call Pager Number Located in [place]

## 2022-06-25 DIAGNOSIS — J9622 Acute and chronic respiratory failure with hypercapnia: Secondary | ICD-10-CM | POA: Diagnosis not present

## 2022-06-25 DIAGNOSIS — J9621 Acute and chronic respiratory failure with hypoxia: Secondary | ICD-10-CM | POA: Diagnosis not present

## 2022-06-25 LAB — CBC WITH DIFFERENTIAL/PLATELET
Abs Immature Granulocytes: 0.09 10*3/uL — ABNORMAL HIGH (ref 0.00–0.07)
Basophils Absolute: 0 10*3/uL (ref 0.0–0.1)
Basophils Relative: 0 %
Eosinophils Absolute: 0.1 10*3/uL (ref 0.0–0.5)
Eosinophils Relative: 1 %
HCT: 35.8 % — ABNORMAL LOW (ref 36.0–46.0)
Hemoglobin: 10.8 g/dL — ABNORMAL LOW (ref 12.0–15.0)
Immature Granulocytes: 1 %
Lymphocytes Relative: 17 %
Lymphs Abs: 1.8 10*3/uL (ref 0.7–4.0)
MCH: 30.3 pg (ref 26.0–34.0)
MCHC: 30.2 g/dL (ref 30.0–36.0)
MCV: 100.6 fL — ABNORMAL HIGH (ref 80.0–100.0)
Monocytes Absolute: 0.8 10*3/uL (ref 0.1–1.0)
Monocytes Relative: 8 %
Neutro Abs: 7.7 10*3/uL (ref 1.7–7.7)
Neutrophils Relative %: 73 %
Platelets: 216 10*3/uL (ref 150–400)
RBC: 3.56 MIL/uL — ABNORMAL LOW (ref 3.87–5.11)
RDW: 15.7 % — ABNORMAL HIGH (ref 11.5–15.5)
WBC: 10.4 10*3/uL (ref 4.0–10.5)
nRBC: 0 % (ref 0.0–0.2)

## 2022-06-25 LAB — COMPREHENSIVE METABOLIC PANEL
ALT: 31 U/L (ref 0–44)
AST: 28 U/L (ref 15–41)
Albumin: 3 g/dL — ABNORMAL LOW (ref 3.5–5.0)
Alkaline Phosphatase: 101 U/L (ref 38–126)
Anion gap: 13 (ref 5–15)
BUN: 18 mg/dL (ref 8–23)
CO2: 34 mmol/L — ABNORMAL HIGH (ref 22–32)
Calcium: 9.2 mg/dL (ref 8.9–10.3)
Chloride: 93 mmol/L — ABNORMAL LOW (ref 98–111)
Creatinine, Ser: 0.5 mg/dL (ref 0.44–1.00)
GFR, Estimated: >60 mL/min (ref >60)
Glucose, Bld: 141 mg/dL — ABNORMAL HIGH (ref 70–99)
Potassium: 4.3 mmol/L (ref 3.5–5.1)
Sodium: 140 mmol/L (ref 135–145)
Total Bilirubin: 1.5 mg/dL — ABNORMAL HIGH (ref 0.3–1.2)
Total Protein: 5.4 g/dL — ABNORMAL LOW (ref 6.5–8.1)

## 2022-06-25 LAB — MAGNESIUM: Magnesium: 1.8 mg/dL (ref 1.7–2.4)

## 2022-06-25 LAB — GLUCOSE, CAPILLARY
Glucose-Capillary: 108 mg/dL — ABNORMAL HIGH (ref 70–99)
Glucose-Capillary: 136 mg/dL — ABNORMAL HIGH (ref 70–99)
Glucose-Capillary: 141 mg/dL — ABNORMAL HIGH (ref 70–99)
Glucose-Capillary: 155 mg/dL — ABNORMAL HIGH (ref 70–99)
Glucose-Capillary: 161 mg/dL — ABNORMAL HIGH (ref 70–99)
Glucose-Capillary: 166 mg/dL — ABNORMAL HIGH (ref 70–99)
Glucose-Capillary: 84 mg/dL (ref 70–99)

## 2022-06-25 LAB — PHOSPHORUS: Phosphorus: 4.5 mg/dL (ref 2.5–4.6)

## 2022-06-25 NOTE — Progress Notes (Signed)
Speech Language Pathology Treatment: [name] Speaking valve  Patient Details Name: Abigail Castillo MRN: [id number] DOB: 1950/10/06 Today's Date: 06/25/2022 Time: 9791-5041 SLP Time Calculation (min) (ACUTE ONLY): 13 min  Assessment / Plan / Recommendation Clinical Impression  Pt was seen for treatment; she was alert during the session, but expressed anxiety regarding use of the PMSV. Pt denied knowledge of how SLP could help reduce her anxiety. Pt was educated regarding the anatomy of the larynx, the impact of the trach on voicing, and the goals of the session. She verbalized understanding and agreement regarding attempting the PMSV. Vitals were RR 19, SpO2 92, and HR 116 at baseline. Pt's cuff was deflated at baseline and she tolerated PMSV for 3 minutes prior to a progressive decline in SpO2 and increased RR. PMSV was removed with SpO2 at 86% and RR at 38. Pt recovered after 9 minutes with SpO2 92% and RR 21, but she then requested that further intervention be deferred today. PMSV may be used intermittently with staff with full supervision. SLP will continue to follow pt.     HPI HPI: Mrs. Trice is a 72 y.o. F recently admitted for COPD flare, discharged to SNF who returned 12/25 due to SpO2 65%. Found to be RSV+, pCO2 80, started on BiPAP. Head CT showing possible subacute infarct of the medial left cerebellar hemisphere. Initial bedside swallow eval 1/1 WFL. Failed bipap wean on 1/2, intubated. Tracheostomy 1/4. Pt with COPD and chronic respiratory failure on 2-3L home O2, depression/anxiety and hx BrCA      SLP Plan  Continue with current plan of care      Recommendations for follow up therapy are one component of a multi-disciplinary discharge planning process, led by the attending physician.  Recommendations may be updated based on patient status, additional functional criteria and insurance authorization.    Recommendations  Diet recommendations: NPO Medication Administration: Via  alternative means      Patient may use Passy-Muir Speech Valve: Intermittently with supervision PMSV Supervision: Full         Oral Care Recommendations: Oral care QID Follow Up Recommendations: SLP at Long-term acute care hospital Assistance recommended at discharge: Frequent or constant Supervision/Assistance SLP Visit Diagnosis: Dysphagia, unspecified (R13.10);Aphonia (R49.1) Plan: Continue with current plan of care         Jonalyn Sedlak I. Hardin Negus, [place], [place] Office number [phone]   Horton Marshall  06/25/2022, 10:41 AM

## 2022-06-25 NOTE — Progress Notes (Signed)
NAME:  Abigail Castillo, MRN:  [id number], DOB:  1951-03-19, LOS: 4 ADMISSION DATE:  05/19/2022, CONSULTATION DATE:  05/20/22 REFERRING MD:  Roger Shelter CHIEF COMPLAINT:  Dyspnea   History of Present Illness:  Abigail Castillo is a 72 y.o. female who has a PMH as below including but not limited to COPD on chronic 2-3L O2 and '10mg'$  Prednisone, OSA on nocturnal BiPAP, and who is followed by Dr. Lamonte Sakai in our office. She had recent admission 11/15 through 12/18 for AECOPD and was then discharged to [facility]  She was recently seen as an outpatient by Dr. Lamonte Sakai on 12/22 and was prescribed a slow prednisone taper with instructions to drop to '10mg'$  and continue until her next follow up appointment.  She presented back to [facility] ED 12/25 with dyspnea and hypoxia down to 65%. She was found to have hypoxic and hypercapnic respiratory failure and her RSV swab returned positive. She was placed on BiPAP and was admitted by [facility].  On 12/26, PCCM was consulted for assistance with BiPAP and COPD management.  Pertinent  Medical History:  has History of tobacco abuse; Depression; COPD (chronic obstructive pulmonary disease) ([place]); Osteoporosis; HOARSENESS, CHRONIC; HYPERTRIGLYCERIDEMIA; Memory change; B12 deficiency; Personal history of colonic polyps; Encounter for routine gynecological examination; Mild hyperlipidemia; Vitamin D deficiency; Esophageal stricture; GERD (gastroesophageal reflux disease); Schizoaffective disorder ([place]); Allergic rhinitis; Chronic respiratory failure ([place]); Acute on chronic respiratory failure with hypoxia ([place]); Elevated blood pressure reading without diagnosis of hypertension; Hepatic steatosis; Ductal carcinoma in situ (DCIS) of left breast; On home oxygen therapy; Hyponatremia; Bronchomalacia; Localized edema; COPD exacerbation ([place]); Tracheobronchomalacia; Acute on chronic respiratory failure with hypoxia and hypercapnia (HCC); COPD with acute exacerbation ([place]); RSV (respiratory syncytial  virus pneumonia); Essential hypertension; Malnutrition of moderate degree; Acute respiratory failure with hypercapnia ([place]); Pressure injury of skin; and Tracheostomy in place [facility]) on their problem list.  Significant Hospital Events: Including procedures, antibiotic start and stop dates in addition to other pertinent events   12/25 admit. 12/26 PCCM consult. 1/2 Failed BiPAP wean, Intubated 1/3 CT Head negative was completed for observed pupillary change 1/4 tracheostomy 1/8 CT head for anisocoria; neg,  1/11 continuing progressive trach collar trials 1/12 BID trach collar trials, appears comfortable  1/14 failed PSV 1/15 pulse prednisone at 40 mg continues to fail SBT trials LTAC denied awaiting vent SNF 1/16 tolerated 1 hr atc. ASA placed on hold at request of IR for PEG.  1/23 PEG placement 1/25 placed back on full support for fatigue/ increased WOB after ATC x 25hrs, softer Bps 1/27 was able to tolerate trach collar for better part of prior day.  Rested at nighttime.  Stood with assistance 1/29 AM, indefinite trach collar  Interim History / Subjective:  Remains stable on TC. Unmotivated Denies pain  Objective:  Blood pressure 105/72, pulse (!) 101, temperature 98 F (36.7 C), temperature source Axillary, resp. rate 19, height '5\' 2"'$  (1.575 m), weight 58.8 kg, SpO2 93 %.    FiO2 (%):  [30 %-40 %] 30 %   Intake/Output Summary (Last 24 hours) at 06/25/2022 0802 Last data filed at 06/25/2022 0615 Gross per 24 hour  Intake 1184 ml  Output 110 ml  Net 1074 ml    Filed Weights   06/21/22 0500 06/24/22 0414 06/25/22 0410  Weight: 59.3 kg 60.4 kg 58.8 kg   Physical Examination: No distress Trach in place minimal secretions Lung sounds diminished Weak but moves ext to command Abd soft, PEG in place  CBC looks  okay BMP benign  Ancillary tests personally reviewed  CO2 33   Assessment & Plan:  Acute on chronic respiratory failure with hypoxia/hypercarbia s/p trach-  lingering issue is deconditioning.  Now weaned from vent. Acute COPD exacerbation in the setting of RSV pneumonia- resolved Hx HTN- controlled on current regimen Chronic anxiety & depression- controlled on current regimen buspar/klonipin GERD Breast cancer- on arimidex L cerebellar and L frontal stroke- age indeterminate found on this admit; CTA head/neck with nothing actionable. Tele w/o afib. Cannot get MRI due to hardware.   On asa/statin.  Likely contributing along with deconditioning to her ambulatory issues. Diabetes type 2, and hyperglycemia- controlled on current regimen Moderate malnutrition, dysphagia- now on nightly TF  - Prednisone wean to off - Progressive mobility - Appreciate PT/OT/SLP continuing to work with patient - Basal bolus insulin - qHS TF - Nebs as ordered - Appreciate TOC help with dispo, something about insurance requirements for length of tracheostomy being in place  Stable for transfer to floor, appreciate TRH taking over starting 06/26/22  Will follow weekly for trach, next 06/30/22  Erskine Emery MD PCCM

## 2022-06-25 NOTE — TOC Progression Note (Signed)
Transition of Care [facility]) - Progression Note    Patient Details  Name: Abigail Castillo MRN: [id number] Date of Birth: Jul 16, 1950  Transition of Care [facility]) CM/SW [place], [place] Phone Number: 06/25/2022, 4:31 PM  Clinical Narrative:     CSW following to refax out for trach SNF closer to patient being medically ready for dc. CSW will continue to follow and assist with patients dc planning needs.  Expected Discharge Plan: [place] (Vent/SNF) Barriers to Discharge: Continued Medical Work up  Expected Discharge Plan and Services In-house Referral: Clinical Social Work   Post Acute Care Choice: Long Term Acute Care (LTAC) Living arrangements for the past 2 months:  (PTA came from [place] short term before that was from home alone)                                       Social Determinants of Health (SDOH) Interventions [place]: No Food Insecurity (05/30/2022)  Housing: Low Risk  (05/30/2022)  Transportation Needs: No Transportation Needs (05/30/2022)  Utilities: Not At Risk (05/30/2022)  Alcohol Screen: Low Risk  (03/26/2021)  Depression (PHQ2-9): Low Risk  (03/26/2021)  Financial Resource Strain: Low Risk  (03/26/2021)  Physical Activity: Inactive (03/26/2021)  Social Connections: Moderately Isolated (03/26/2021)  Stress: No Stress Concern Present (03/26/2021)  Tobacco Use: Medium Risk (06/17/2022)    Readmission Risk Interventions     No data to display

## 2022-06-26 ENCOUNTER — Inpatient Hospital Stay (HOSPITAL_COMMUNITY): Payer: 59

## 2022-06-26 DIAGNOSIS — J441 Chronic obstructive pulmonary disease with (acute) exacerbation: Secondary | ICD-10-CM | POA: Diagnosis not present

## 2022-06-26 DIAGNOSIS — J9621 Acute and chronic respiratory failure with hypoxia: Secondary | ICD-10-CM | POA: Diagnosis not present

## 2022-06-26 DIAGNOSIS — I63342 Cerebral infarction due to thrombosis of left cerebellar artery: Secondary | ICD-10-CM

## 2022-06-26 DIAGNOSIS — Z515 Encounter for palliative care: Secondary | ICD-10-CM

## 2022-06-26 DIAGNOSIS — J9622 Acute and chronic respiratory failure with hypercapnia: Secondary | ICD-10-CM | POA: Diagnosis not present

## 2022-06-26 LAB — GLUCOSE, CAPILLARY
Glucose-Capillary: 69 mg/dL — ABNORMAL LOW (ref 70–99)
Glucose-Capillary: 73 mg/dL (ref 70–99)
Glucose-Capillary: 162 mg/dL — ABNORMAL HIGH (ref 70–99)
Glucose-Capillary: 119 mg/dL — ABNORMAL HIGH (ref 70–99)
Glucose-Capillary: 182 mg/dL — ABNORMAL HIGH (ref 70–99)
Glucose-Capillary: 104 mg/dL — ABNORMAL HIGH (ref 70–99)

## 2022-06-26 LAB — BASIC METABOLIC PANEL
Anion gap: 10 (ref 5–15)
BUN: 17 mg/dL (ref 8–23)
CO2: 36 mmol/L — ABNORMAL HIGH (ref 22–32)
Calcium: 9.4 mg/dL (ref 8.9–10.3)
Chloride: 93 mmol/L — ABNORMAL LOW (ref 98–111)
Creatinine, Ser: 0.5 mg/dL (ref 0.44–1.00)
GFR, Estimated: >60 mL/min (ref >60)
Glucose, Bld: 168 mg/dL — ABNORMAL HIGH (ref 70–99)
Potassium: 3.8 mmol/L (ref 3.5–5.1)
Sodium: 139 mmol/L (ref 135–145)

## 2022-06-26 LAB — LIPID PANEL
Cholesterol: 96 mg/dL (ref 0–200)
HDL: 43 mg/dL (ref >40)
LDL Cholesterol: 27 mg/dL (ref 0–99)
Total CHOL/HDL Ratio: 2.2 RATIO
Triglycerides: 131 mg/dL (ref <150)
VLDL: 26 mg/dL (ref 0–40)

## 2022-06-26 LAB — MAGNESIUM: Magnesium: 1.9 mg/dL (ref 1.7–2.4)

## 2022-06-26 LAB — BRAIN NATRIURETIC PEPTIDE: B Natriuretic Peptide: 28.7 pg/mL (ref 0.0–100.0)

## 2022-06-26 MED ORDER — FUROSEMIDE 10 MG/ML IJ SOLN
40.0000 mg | Freq: Once | INTRAMUSCULAR | Status: AC
  Administered 2022-06-26: 40 mg via INTRAVENOUS
  Filled 2022-06-26: qty 4

## 2022-06-26 MED ORDER — OYSTER SHELL CALCIUM/D 500-5 MG-MCG PO TABS
1.0000 | ORAL_TABLET | Freq: Two times a day (BID) | ORAL | Status: DC
  Filled 2022-06-26: qty 1

## 2022-06-26 MED ORDER — MIDAZOLAM HCL 2 MG/2ML IJ SOLN: 1.0000 mg | INTRAMUSCULAR | Status: DC | PRN

## 2022-06-26 MED ORDER — METOPROLOL TARTRATE 25 MG PO TABS
25.0000 mg | ORAL_TABLET | Freq: Two times a day (BID) | ORAL | Status: DC
  Administered 2022-06-26 – 2022-07-04 (×13): 25 mg
  Filled 2022-06-26 (×16): qty 1

## 2022-06-26 MED ORDER — MIDAZOLAM HCL 2 MG/2ML IJ SOLN
1.0000 mg | INTRAMUSCULAR | Status: DC | PRN
  Administered 2022-06-28: 1 mg via INTRAVENOUS
  Filled 2022-06-26: qty 2

## 2022-06-26 MED ORDER — CALCIUM CARBONATE 1250 (500 CA) MG PO TABS
1.0000 | ORAL_TABLET | Freq: Two times a day (BID) | ORAL | Status: DC
  Administered 2022-06-26 – 2022-07-04 (×17): 1250 mg
  Filled 2022-06-26 (×20): qty 1

## 2022-06-26 MED ORDER — LACTATED RINGERS IV SOLN: INTRAVENOUS | Status: DC

## 2022-06-26 MED ORDER — VITAMIN B-12 1000 MCG PO TABS
1000.0000 ug | ORAL_TABLET | Freq: Every morning | ORAL | Status: DC
  Administered 2022-06-26 – 2022-07-04 (×9): 1000 ug
  Filled 2022-06-26 (×9): qty 1

## 2022-06-26 NOTE — TOC Progression Note (Addendum)
Transition of Care [facility]) - Progression Note    Patient Details  Name: Abigail Castillo MRN: [id number] Date of Birth: 1950-06-28  Transition of Care [facility]) CM/SW [place], [place] Phone Number: 06/26/2022, 2:47 PM  Clinical Narrative:      CSW following to fax patient out for [facility] SNF placement. Patients trach will need to be weaned down to 28% trach collar for a minimum of 48 hours before facility will accept.  Lurline Idol must be uncuffed prior to transfer to trach only SNF. Most facilities will not accept if patient is requiring suctioning more frequently than every 4 hours. CSW will continue to follow and assist with patients dc planning needs.   Expected Discharge Plan: [place] (Vent/SNF) Barriers to Discharge: Continued Medical Work up  Expected Discharge Plan and Services In-house Referral: Clinical Social Work   Post Acute Care Choice: Long Term Acute Care (LTAC) Living arrangements for the past 2 months:  (PTA came from [place] short term before that was from home alone)                                       Social Determinants of Health (SDOH) Interventions [place]: No Food Insecurity (05/30/2022)  Housing: Low Risk  (05/30/2022)  Transportation Needs: No Transportation Needs (05/30/2022)  Utilities: Not At Risk (05/30/2022)  Alcohol Screen: Low Risk  (03/26/2021)  Depression (PHQ2-9): Low Risk  (03/26/2021)  Financial Resource Strain: Low Risk  (03/26/2021)  Physical Activity: Inactive (03/26/2021)  Social Connections: Moderately Isolated (03/26/2021)  Stress: No Stress Concern Present (03/26/2021)  Tobacco Use: Medium Risk (06/17/2022)    Readmission Risk Interventions     No data to display

## 2022-06-26 NOTE — Progress Notes (Signed)
Physical Therapy Treatment Patient Details Name: Abigail Castillo MRN: [id number] DOB: Jun 22, 1950 Today's Date: 06/26/2022   History of Present Illness Abigail Castillo is a 72 y.o. F recently admitted for COPD flare, discharged to SNF who returned due to SpO2 65%. Found to be  RSV+, pCO2 80, started on BiPAP.   Head CT showing possible subacute infarct of the medial left cerebellar hemisphere. Failed bipap wean on 1/2, intubated. Tracheostomy 1/4. Pt with COPD and chronic respiratory failure on 2-3L home O2, depression/anxiety and hx BrCA    PT Comments    Pt making steady progress. Able to amb very short distance in room with assist today. Requires short bouts of activity with frequent rest.    Recommendations for follow up therapy are one component of a multi-disciplinary discharge planning process, led by the attending physician.  Recommendations may be updated based on patient status, additional functional criteria and insurance authorization.  Follow Up Recommendations  Skilled nursing-short term rehab (<3 hours/day) Can patient physically be transported by private vehicle: No   Assistance Recommended at Discharge Frequent or constant Supervision/Assistance  Patient can return home with the following A lot of help with bathing/dressing/bathroom;A little help with walking and/or transfers;Assist for transportation   Equipment Recommendations  Hospital bed;Wheelchair (measurements PT);Wheelchair cushion (measurements PT);Other (comment)    Recommendations for Other Services       Precautions / Restrictions Precautions Precautions: Fall Precaution Comments: trach, vent vs trach collar Restrictions Weight Bearing Restrictions: No     Mobility  Bed Mobility Overal bed mobility: Needs Assistance Bed Mobility: Rolling, Sidelying to Sit Rolling: Supervision Sidelying to sit: Min assist, HOB elevated       General bed mobility comments: Assist to elevate trunk and bring hips to  EOB    Transfers Overall transfer level: Needs assistance Equipment used: Ambulation equipment used, Rolling walker (2 wheels) Transfers: Sit to/from Stand, Bed to chair/wheelchair/BSC Sit to Stand: Min assist, +2 safety/equipment   Step pivot transfers: Min assist, +2 safety/equipment       General transfer comment: Stood from bed with Stedy with assist to bring hips up. Used Stedy for bed to chair. Stood with walker from chair and took pivotal steps chair to bsc with assist for balance and support. Stood from Abigail Castillo with walker to amb Transfer via Lift Equipment: Stedy  Ambulation/Gait Ambulation/Gait assistance: Min assist, +2 safety/equipment Gait Distance (Feet): 4 Feet Assistive device: Rolling walker (2 wheels) Gait Pattern/deviations: Step-through pattern, Decreased stride length, Decreased step length - right, Decreased step length - left, Trunk flexed Gait velocity: decr Gait velocity interpretation: <1.31 ft/sec, indicative of household ambulator   General Gait Details: Assist for balance and support.   Stairs             Wheelchair Mobility    Modified Rankin (Stroke Patients Only)       Balance Overall balance assessment: Needs assistance Sitting-balance support: No upper extremity supported, Feet supported Sitting balance-Leahy Scale: Fair     Standing balance support: Bilateral upper extremity supported, Reliant on assistive device for balance Standing balance-Leahy Scale: Poor Standing balance comment: walker and min guard for static standing                            Cognition Arousal/Alertness: Awake/alert Behavior During Therapy: Flat affect, Anxious Overall Cognitive Status: Difficult to assess  General Comments: anxious with shortness of breath. Follows commands        Exercises      General Comments General comments (skin integrity, edema, etc.): Pt on trach collar. Incr RR  with activity. SpO2 high 80's with activity.      Pertinent Vitals/Pain Pain Assessment Pain Assessment: Faces Faces Pain Scale: No hurt    Home Living                          Prior Function            PT Goals (current goals can now be found in the care plan section) Progress towards PT goals: Progressing toward goals    Frequency    Min 2X/week      PT Plan Discharge plan needs to be updated    Co-evaluation              AM-PAC PT "6 Clicks" Mobility   Outcome Measure  Help needed turning from your back to your side while in a flat bed without using bedrails?: A Little Help needed moving from lying on your back to sitting on the side of a flat bed without using bedrails?: A Little Help needed moving to and from a bed to a chair (including a wheelchair)?: A Little Help needed standing up from a chair using your arms (e.g., wheelchair or bedside chair)?: A Little Help needed to walk in hospital room?: Total Help needed climbing 3-5 steps with a railing? : Total 6 Click Score: 14    End of Session Equipment Utilized During Treatment: Oxygen Activity Tolerance: Patient limited by fatigue (Limited by dyspnea) Patient left: in chair;with call bell/phone within reach;with chair alarm set Nurse Communication: Mobility status PT Visit Diagnosis: Difficulty in walking, not elsewhere classified (R26.2);Unsteadiness on feet (R26.81);Other abnormalities of gait and mobility (R26.89);Muscle weakness (generalized) (M62.81)     Time: 2482-5003 PT Time Calculation (min) (ACUTE ONLY): 19 min  Charges:  $Therapeutic Activity: 8-22 mins                     [place] [facility] [place] 06/26/2022, 12:26 PM

## 2022-06-26 NOTE — Plan of Care (Signed)
Discussed with Dr. Jeralyn Ruths radiologist, pt has probably right lower chest wall metallic foreign body which can be seen in 2005 CXR. Pt had never had MRI before and I felt currently pt not in the adequate state that she can tell any discomfort during MRI, we decided to cancel MRI at this time. Will repeat head CT instead.   Rosalin Hawking, MD PhD Stroke Neurology 06/26/2022 2:44 PM

## 2022-06-26 NOTE — Consult Note (Signed)
Stroke Neurology Consultation Note  Consult Requested by: Dr. Candiss Norse  Reason for Consult: ? Subacute infarct  Consult Date: 06/26/22   The history was obtained from the chart and Dr. Candiss Norse.  During history and examination, all items were able to obtain unless otherwise noted.  History of Present Illness:  Abigail Castillo is a 72 y.o. Caucasian female with PMH of COPD on home O2, depression/anxiety and breast cancer, former smoker admitted on 05/19/22 for respiratory failure. She was initially on BiPAP but failed wean and was intubated. Had trach on 1/4 and PEG 1/23. Finally weaned off went now on trach collar, transfer out of ICU.   On 1/3 early morning reported by RN, R pupil 66m and L pupil 320m reactive to light, other neuro exam intact. Stat CT showed "possible subacute infarct of the medial left cerebellar hemisphere with chronic left frontal encephalomalacia. MRI is recommended". CTA head and neck negative. Repeat CT on 1/8 showed "symmetric hypodensity in the central cerebellar white matter, similar to 05/28/2022. This could indicate chronic versus subacute ischemic changes. MRI follow-up may be helpful. No acute intracranial CT findings or interval changes from the recent study". Echo 04/26/22 with EF 70-75%. LDL 27 and A1C 6.1. she is on ASA 81 and lipitor 40 now. Neurology consulted for further stroke evaluation.   LSN: unclear, probably 05/28/21 tPA Given: No: outside window  Past Medical History:  Diagnosis Date   Anxiety    Blood transfusion    Blood transfusion without reported diagnosis    [place]   Chronic mental illness    [place]   DEPRESSION 05/31/2009   Esophageal stricture 03/14/2015   GERD (gastroesophageal reflux disease)    Hepatic steatosis    Osteoporosis    Oxygen dependent    on O2 at 2-3L/min 24hr/day   Tobacco abuse    ongoing   Vitamin B 12 deficiency    Vitamin D deficiency     Past  Surgical History:  Procedure Laterality Date   ABDOMINAL HYSTERECTOMY     BREAST EXCISIONAL BIOPSY Left 2017   BREAST LUMPECTOMY  1972   left breast   BREAST LUMPECTOMY WITH RADIOACTIVE SEED LOCALIZATION Left 11/22/2015   Procedure: LEFT BREAST LUMPECTOMY WITH RADIOACTIVE SEED LOCALIZATION;  Surgeon: ThErroll LunaMD;  [facility];  Service: General;  Laterality: Left;   BREAST LUMPECTOMY WITH RADIOACTIVE SEED LOCALIZATION Left 07/03/2021   Procedure: LEFT BREAST LUMPECTOMY WITH RADIOACTIVE SEED LOCALIZATION;  Surgeon: CoErroll LunaMD;  [facility] Service: General;  Laterality: Left;   BREAST SURGERY  ?2015   lumpectomy, left   CERVICAL CONE BIOPSY     CHOLECYSTECTOMY     COLONOSCOPY     ESOPHAGOGASTRODUODENOSCOPY (EGD) WITH ESOPHAGEAL DILATION     IR GASTROSTOMY TUBE MOD SED  06/17/2022   UPPER GASTROINTESTINAL ENDOSCOPY      Family History  Problem Relation Age of Onset   Coronary artery disease Mother    Diabetes Mother    Hypertension Mother    Coronary artery disease Father    Diabetes Father        Grandfather   Hypertension Father    Stroke Father    Other Father        colostomy bag   Breast cancer Sister    Breast cancer Sister    Diabetes Sister    Breast cancer Sister    Hypertension Sister    Mental illness Sister  x 4   Cancer Other        niece--breast   Esophageal cancer Neg Hx    Stomach cancer Neg Hx    Rectal cancer Neg Hx    Colon cancer Neg Hx    Colon polyps Neg Hx     Social History:  reports that she quit smoking about 10 years ago. Her smoking use included cigarettes. She has a 40.00 pack-year smoking history. She has been exposed to tobacco smoke. She has never used smokeless tobacco. She reports that she does not drink alcohol and does not use drugs.  Allergies:  Allergies  Allergen Reactions   Sulfonamide Derivatives Hives    No current facility-administered medications on file prior to encounter.   Current  Outpatient Medications on File Prior to Encounter  Medication Sig Dispense Refill   albuterol (PROVENTIL) (2.5 MG/3ML) 0.083% nebulizer solution Take 3 mLs (2.5 mg total) by nebulization every 2 (two) hours as needed for wheezing or shortness of breath.     albuterol (VENTOLIN HFA) 108 (90 Base) MCG/ACT inhaler INHALE TWO PUFFS BY MOUTH INTO THE LUNGS EVERY 6 HOURS AS NEEDED FOR WHEEZING OR SHORTNESS OF BREATH (Patient taking differently: Inhale 2 puffs into the lungs every 6 (six) hours as needed for wheezing or shortness of breath.) 8.5 g 1   amLODipine (NORVASC) 2.5 MG tablet Take 1 tablet (2.5 mg total) by mouth daily. (Patient taking differently: Take 2.5 mg by mouth in the morning. (0900)) 30 tablet 0   anastrozole (ARIMIDEX) 1 MG tablet TAKE ONE TABLET BY MOUTH DAILY (Patient taking differently: Take 1 mg by mouth in the morning. (0900)) 90 tablet 2   arformoterol (BROVANA) 15 MCG/2ML NEBU Take 2 mLs (15 mcg total) by nebulization 2 (two) times daily. (Patient taking differently: Take 15 mcg by nebulization 2 (two) times daily. (0900, 2100)) 120 mL    azithromycin (ZITHROMAX) 500 MG tablet Take 1 tablet (500 mg total) by mouth 3 (three) times a week. (Patient taking differently: Take 500 mg by mouth every Monday, Wednesday, and Friday.)  0   bisacodyl (DULCOLAX) 10 MG suppository Place 10 mg rectally daily as needed (constipation not relieved by Milk of Magnesia).     Calcium Carbonate-Vitamin D 600-400 MG-UNIT tablet Take 1 tablet by mouth 2 (two) times daily. (Patient taking differently: Take 1 tablet by mouth 2 (two) times daily. (0900, 2100))     cholecalciferol (VITAMIN D3) 25 MCG (1000 UNIT) tablet Take 3,000 Units by mouth in the morning. (0900)     cyanocobalamin (VITAMIN B12) 1000 MCG tablet TAKE ONE TABLET BY MOUTH DAILY (Patient taking differently: Take 1,000 mcg by mouth in the morning. (0900)) 90 tablet 0   Eyelid Cleansers (AVENOVA EX) Place 3 sprays into both eyes 2 (two) times  daily. (0900, 1700)     furosemide (LASIX) 20 MG tablet Take 1 tablet (20 mg total) by mouth daily. (Patient taking differently: Take 20 mg by mouth in the morning. (0900))     ipratropium-albuterol (DUONEB) 0.5-2.5 (3) MG/3ML SOLN Take 3 mLs by nebulization in the morning, at noon, in the evening, and at bedtime.     loratadine (CLARITIN) 10 MG tablet Take 1 tablet (10 mg total) by mouth daily. (Patient taking differently: Take 10 mg by mouth in the morning. (0900))     LORazepam (ATIVAN) 1 MG tablet Take 1 mg by mouth 2 (two) times daily as needed for anxiety.     Magnesium Hydroxide (MILK OF MAGNESIA PO) Take  30 mLs by mouth daily as needed (no BM in 3 days).     OLANZapine (ZYPREXA) 7.5 MG tablet Take 7.5 mg by mouth in the morning. (0900)     OXYGEN Inhale 3 L into the lungs continuous.     predniSONE (DELTASONE) 10 MG tablet Take 3 tabs (30 mg total) daily x 1 week, then 2 tabs (20 mg total) daily x 1 week, then continue 1 tab (10 Mg total) daily until office visit with pulmonology. (Patient taking differently: Take 10-30 mg by mouth in the morning. 30 mg once daily for 7 days, 20 mg once daily for 7 days, 10 mg once daily until office visit with pulmonology)     revefenacin (YUPELRI) 175 MCG/3ML nebulizer solution Take 3 mLs (175 mcg total) by nebulization daily. (Patient taking differently: Take 175 mcg by nebulization in the morning. (0900))     Sodium Phosphates (ENEMA DISPOSABLE RE) Place 1 enema rectally daily as needed (constipation not relieved by bisacodyl).     vitamin E 180 MG (400 UNITS) capsule Take 400 Units by mouth in the morning. (0900)     Spacer/Aero-Holding Chambers (AEROCHAMBER MV) inhaler Use as instructed 1 each 0    Review of Systems: A full ROS was attempted today and was not able to be performed due to on trach collar  Physical Examination: Temp:  [97.4 F (36.3 C)-98.1 F (36.7 C)] 97.5 F (36.4 C) (02/01 1151) Pulse Rate:  [78-128] 96 (02/01 1142) Resp:   [16-29] 26 (02/01 1142) BP: (78-143)/(54-115) 109/76 (02/01 1100) SpO2:  [87 %-100 %] 88 % (02/01 1142) FiO2 (%):  [30 %-40 %] 40 % (02/01 1142) Weight:  [58.9 kg] 58.9 kg (02/01 0500)  General - well nourished, well developed, in mild SOB, on trach collar.    Ophthalmologic - fundi not visualized due to noncooperation.    Cardiovascular - regular rhythm and rate  Neuro - awake, alert, eyes open, on trach collar, not able to verbally answer orientation questions, however, not able to show me her age with fingers. Following all simple commands though. No gaze palsy, tracking bilaterally, visual field full, PERRL. No facial droop. Tongue midline. Bilateral UEs 4/5, no drift. Bilaterally LEs 3/5 proximal and 4/5 distally. Sensation symmetrical bilaterally, b/l FTN intact grossly, gait not tested.    Data Reviewed: DG Chest Port 1 View  Result Date: 06/26/2022 CLINICAL DATA:  Provided history: Shortness of breath. EXAM: PORTABLE CHEST 1 VIEW COMPARISON:  Prior chest radiographs 06/19/2022 and earlier. Chest CT 05/31/2022. FINDINGS: Tracheostomy tube in place. The cardiomediastinal silhouette is unchanged. Aortic atherosclerosis. Emphysema. 2.2 cm pulmonary nodular opacity at the level of the mid right lung, new. Interstitial opacities at both lung bases (increased on the right, similar on the left) as compared to the prior exam. No evidence of pleural effusion or pneumothorax. No acute bony abnormality identified. IMPRESSION: 1. 2.2 cm nodular pulmonary opacity at the level of the mid right lung, new from the prior examination of 06/19/2022 and likely infectious/inflammatory. 2. Interstitial opacities at both lung bases (increased on the right, similar on the left). This may reflect pulmonary edema or infection. 3.  Aortic Atherosclerosis (ICD10-I70.0). Electronically Signed   By: Kellie Simmering D.O.   On: 06/26/2022 08:42   DG Chest Port 1 View  Result Date: 06/19/2022 CLINICAL DATA:  Respiratory  distress. EXAM: PORTABLE CHEST 1 VIEW COMPARISON:  One-view chest x-ray FINDINGS: Tracheostomy tube is in place. Heart size is normal. Atherosclerotic calcifications are present at the  aortic arch. Emphysematous changes are again noted in both lungs. Superimposed interstitial pattern over the lung bases is slightly improved from 1/14, more prominent left than right. No significant airspace consolidation is present. Chronic pleural thickening present at both bases without superimposed effusions. IMPRESSION: 1. Slight improvement in interstitial pattern over the lung bases, more prominent left than right, likely representing improving edema. 2. Emphysema. Electronically Signed   By: San Morelle M.D.   On: 06/19/2022 14:00   IR GASTROSTOMY TUBE MOD SED  Result Date: 06/17/2022 INDICATION: ACUTE ON CHRONIC RESPIRATORY FAILURE, HYPOXIA, DYSPHAGIA EXAM: FLUOROSCOPIC 20 FRENCH PULL-THROUGH GASTROSTOMY Date:  06/17/2022 06/17/2022 10:15 am Radiologist:  M. Daryll Brod, MD Guidance:  FLUOROSCOPIC MEDICATIONS: ANCEF 2 G; Antibiotics were administered within 1 hour of the procedure. Glucagon 0.5 mg IV ANESTHESIA/SEDATION: Versed 0.5 mg IV; Fentanyl 25 mcg IV Moderate Sedation Time:  10 minutes The patient was continuously monitored during the procedure by the interventional radiology nurse under my direct supervision. CONTRAST:  10 cc-administered into the gastric lumen. FLUOROSCOPY: Fluoroscopy Time: 2 minutes 24 seconds (11 mGy). COMPLICATIONS: None immediate. PROCEDURE: Informed consent was obtained from the patient following explanation of the procedure, risks, benefits and alternatives. The patient understands, agrees and consents for the procedure. All questions were addressed. A time out was performed. Maximal barrier sterile technique utilized including caps, mask, sterile gowns, sterile gloves, large sterile drape, hand hygiene, and betadine prep. The left upper quadrant was sterilely prepped and draped.  An oral gastric catheter was inserted into the stomach under fluoroscopy. The existing nasogastric feeding tube was removed. Air was injected into the stomach for insufflation and visualization under fluoroscopy. The air distended stomach was confirmed beneath the anterior abdominal wall in the frontal and lateral projections. Under sterile conditions and local anesthesia, a 1 gauge trocar needle was utilized to access the stomach percutaneously beneath the left subcostal margin. Needle position was confirmed within the stomach under biplane fluoroscopy. Contrast injection confirmed position also. A single T tack was deployed for gastropexy. Over an Amplatz guide wire, a 9-French sheath was inserted into the stomach. A snare device was utilized to capture the oral gastric catheter. The snare device was pulled retrograde from the stomach up the esophagus and out the oropharynx. The 20-French pull-through gastrostomy was connected to the snare device and pulled antegrade through the oropharynx down the esophagus into the stomach and then through the percutaneous tract external to the patient. The gastrostomy was assembled externally. Contrast injection confirms position in the stomach. Images were obtained for documentation. The patient tolerated procedure well. No immediate complication. IMPRESSION: Fluoroscopic insertion of a 20-French "pull-through" gastrostomy. Electronically Signed   By: Jerilynn Mages.  Shick M.D.   On: 06/17/2022 10:32   DG Abd 1 View  Result Date: 06/14/2022 CLINICAL DATA:  Nasogastric tube placement EXAM: ABDOMEN - 1 VIEW COMPARISON:  06/12/2022 FINDINGS: Nasogastric tube tip overlies the expected distal second portion of the duodenum. Normal abdominal gas pattern. No free intraperitoneal gas. Cholecystectomy clips seen in the right upper quadrant. Pelvis excluded from view. IMPRESSION: 1. Nasogastric tube tip within the distal second portion of the duodenum. Electronically Signed   By: Fidela Salisbury M.D.   On: 06/14/2022 03:12   DG Abd 1 View  Result Date: 06/12/2022 CLINICAL DATA:  Nasogastric tube present. EXAM: ABDOMEN - 1 VIEW COMPARISON:  06/12/2022 at 20:06 p.m. FINDINGS: An enteric tube is present with tip projecting over the right upper quadrant consistent with location in the distal stomach. No change  since prior study. Gas-filled small and large bowel as well as the stomach suggesting ileus. Surgical clips in the right upper quadrant. IMPRESSION: Enteric tube tip projects over the right upper quadrant consistent with location in the distal stomach. Electronically Signed   By: Lucienne Capers M.D.   On: 06/12/2022 22:53   DG Abd Portable 1V  Result Date: 06/12/2022 CLINICAL DATA:  72 year old female enteric tube placement. EXAM: PORTABLE ABDOMEN - 1 VIEW COMPARISON:  05/28/2022. FINDINGS: Portable AP semi upright view at 1406 hours. Previously seen feeding tube has been removed. NG type tube placed into the stomach, side hole at the level of the gastric body. Stable cholecystectomy clips. Negative visible bowel gas. Stable visible lung bases. IMPRESSION: Previously seen feeding tube removed. NG tube now placed into the stomach, side hole at the level of the gastric body. Electronically Signed   By: Genevie Ann M.D.   On: 06/12/2022 14:15   DG CHEST PORT 1 VIEW  Result Date: 06/08/2022 CLINICAL DATA:  Hypoxia EXAM: PORTABLE CHEST 1 VIEW COMPARISON:  May 31, 2022 FINDINGS: Emphysematous changes in the lungs, particularly the right upper lobe. Small bilateral effusions versus pleural thickening. Bibasilar atelectasis. No other interval changes or acute abnormalities. IMPRESSION: 1. Emphysematous changes in the lungs. 2. Small bilateral pleural effusions versus pleural thickening. 3. Bibasilar opacities favored represent atelectasis. Kerley B lines in the left greater than right lung bases favored represent mild edema. Electronically Signed   By: Dorise Bullion III M.D.   On: 06/08/2022  15:34   CT HEAD WO CONTRAST (5MM)  Result Date: 06/02/2022 CLINICAL DATA:  Anisocoria and altered sensorium. EXAM: CT HEAD WITHOUT CONTRAST TECHNIQUE: Contiguous axial images were obtained from the base of the skull through the vertex without intravenous contrast. RADIATION DOSE REDUCTION: This exam was performed according to the departmental dose-optimization program which includes automated exposure control, adjustment of the mA and/or kV according to patient size and/or use of iterative reconstruction technique. COMPARISON:  Recent head CT 05/28/2022. FINDINGS: Brain: There is symmetric hypodensity in central cerebellar white matter, is similar to 05/28/2022 and could indicate chronic versus subacute ischemic changes. There is no positive mass effect seemingly associated with it. There is moderately advanced cerebral atrophy and atrophic ventriculomegaly, and a chronic infarct of the lower lateral left frontal lobe. There is moderate to severe small vessel disease of the cerebral white matter. There are tiny chronic lacunar infarcts in both thalami. No acute cortical based infarct is seen and no hemorrhage, mass effect or midline shift is evident. Basal cisterns are clear. Vascular: There is moderate calcification of the carotid siphons. No hyperdense central vessel. Skull: There is osteopenia without evidence of fractures or focal lesions. Sinuses/Orbits: There is a nasoenteric intubation entering from the left, with a feeding tube. There is minimal fluid in the maxillary and bilateral sphenoid sinuses which could be related to the nasoenteric intubation or due to sinusitis. There is mild membrane thickening in the ethmoids. There is scattered increased fluid in the left mastoid air cells. The right mastoids and both middle ears are clear. Other: None. IMPRESSION: 1. Symmetric hypodensity in the central cerebellar white matter, similar to 05/28/2022. This could indicate chronic versus subacute ischemic  changes. MRI follow-up may be helpful. 2. Atrophy, chronic left frontal infarct, and moderate to severe small vessel disease. 3. No acute intracranial CT findings or interval changes from the recent study. 4. Sinus and left mastoid disease with increased fluid in the maxillary and sphenoid sinuses and left  mastoids, possibly related to nasoenteric intubation or could be due to sinusitis/mastoiditis. Electronically Signed   By: Telford Nab M.D.   On: 06/02/2022 21:45   CT Angio Chest Pulmonary Embolism (PE) W or WO Contrast  Result Date: 05/31/2022 CLINICAL DATA:  High clinical suspicion for pulmonary embolism EXAM: CT ANGIOGRAPHY CHEST WITH CONTRAST TECHNIQUE: Multidetector CT imaging of the chest was performed using the standard protocol during bolus administration of intravenous contrast. Multiplanar CT image reconstructions and MIPs were obtained to evaluate the vascular anatomy. RADIATION DOSE REDUCTION: This exam was performed according to the departmental dose-optimization program which includes automated exposure control, adjustment of the mA and/or kV according to patient size and/or use of iterative reconstruction technique. CONTRAST:  58m OMNIPAQUE IOHEXOL 350 MG/ML SOLN COMPARISON:  Previous CT done on 04/14/2022, chest radiograph done earlier today FINDINGS: Cardiovascular: There is homogeneous enhancement in thoracic aorta. There are scattered calcifications in thoracic aorta. There are no intraluminal filling defects in pulmonary artery branches. Few coronary artery calcifications are seen. Small pericardial effusion is seen. Mediastinum/Nodes: Tip of tracheostomy is slightly above the level of the aortic arch. There is narrowing of AP diameter of lower trachea and main bronchi which may be due to tracheobronchomalacia or spasm. Enteric tube is noted traversing the esophagus. Lungs/Pleura: Bullous emphysema is seen in both lungs, more so in the upper lung fields. There is slight prominence of  interstitial markings in the mid and lower lung fields. There is no focal pulmonary consolidation. Faint ground-glass densities are seen in mid and lower lung fields which appears slightly more prominent. There is linear nodular density in anterior right lower lung field which appears less prominent. There is faint 4 mm nodular density in right lower lobe which has not changed. Upper Abdomen: Tip of enteric tube is seen in the antrum of the stomach. Surgical clips are seen in gallbladder fossa. Musculoskeletal: No acute findings are seen. Review of the MIP images confirms the above findings. IMPRESSION: There is no evidence of pulmonary artery embolism. There is no evidence of thoracic aortic dissection. Aortic atherosclerosis. Small pericardial effusion is seen. COPD with bullous emphysema in the upper lung fields. There is subtle increase in interstitial markings in mid and lower lung fields which may suggest scarring or mild superimposed interstitial pneumonia. There is no focal pulmonary consolidation. There is no pleural effusion. Other findings as described in the body of the report. Electronically Signed   By: PElmer PickerM.D.   On: 05/31/2022 14:54   DG CHEST PORT 1 VIEW  Result Date: 05/31/2022 CLINICAL DATA:  Dyspnea EXAM: PORTABLE CHEST 1 VIEW COMPARISON:  05/30/2022 and 07/05/2019. FINDINGS: Hyperinflated lungs consistent with bullous emphysematous. 2.5 cm indeterminate nodular structure overlying the upper medial left hemithorax. Bibasilar pulmonary interstitial changes suggesting pulmonary edema, scarring or interstitial lung disease. Trace pleural effusions. No pneumothorax. Tracheostomy tip just below thoracic inlet. Feeding tube tip below the diaphragm and off x-ray. IMPRESSION: 1. Hyperinflated lungs with bullous changes of COPD. 2. Indeterminate nodular lesion upper medial left hemithorax, 2.5 cm. 3. Bibasilar pulmonary interstitial changes consistent with pulmonary edema, interstitial  lung disease or scarring. 4. Minimal pleural effusions. Electronically Signed   By: JSammie BenchM.D.   On: 05/31/2022 14:04   DG Chest Port 1 View  Result Date: 05/30/2022 CLINICAL DATA:  Respiratory failure EXAM: PORTABLE CHEST 1 VIEW COMPARISON:  May 29, 2022 FINDINGS: The feeding tube terminates below today's film. The tracheostomy tube is stable. No pneumothorax. Emphysematous changes in the  lungs. Decreasing bibasilar opacities. No other interval changes. IMPRESSION: 1. Stable support apparatus. 2. Emphysema. 3. Decreased bibasilar opacities consistent with improving atelectasis. Electronically Signed   By: Dorise Bullion III M.D.   On: 05/30/2022 08:15   DG Chest Port 1 View  Result Date: 05/29/2022 CLINICAL DATA:  Status post tracheostomy. EXAM: PORTABLE CHEST 1 VIEW COMPARISON:  May 27, 2022. FINDINGS: The heart size and mediastinal contours are within normal limits. Tracheostomy and feeding tubes are in grossly good position. Emphysematous change is noted bilaterally with probable bibasilar scarring or subsegmental atelectasis. The visualized skeletal structures are unremarkable. IMPRESSION: Diffuse emphysematous changes noted with bibasilar subsegmental atelectasis or scarring. Tracheostomy and feeding tubes are in grossly good position. Emphysema (ICD10-J43.9). Electronically Signed   By: Marijo Conception M.D.   On: 05/29/2022 14:46   DG Abd Portable 1V  Result Date: 05/28/2022 CLINICAL DATA:  Feeding tube placement EXAM: PORTABLE ABDOMEN - 1 VIEW limited for tube placement COMPARISON:  05/27/2022 FINDINGS: Limited x-ray for tube placement demonstrates the Dobbhoff tube with tip along the extreme distal stomach or proximal duodenum. Gas is seen along nondilated loops of bowel elsewhere in the upper abdomen. Overlapping cardiac leads. Portable x-ray IMPRESSION: Limited x-ray demonstrates Dobbhoff tube overlying the extreme distal stomach or proximal duodenum. Electronically Signed   By:  Jill Side M.D.   On: 05/28/2022 10:42   CT ANGIO HEAD NECK W WO CM  Result Date: 05/28/2022 CLINICAL DATA:  Neuro deficit with acute stroke suspected EXAM: CT ANGIOGRAPHY HEAD AND NECK TECHNIQUE: Multidetector CT imaging of the head and neck was performed using the standard protocol during bolus administration of intravenous contrast. Multiplanar CT image reconstructions and MIPs were obtained to evaluate the vascular anatomy. Carotid stenosis measurements (when applicable) are obtained utilizing NASCET criteria, using the distal internal carotid diameter as the denominator. RADIATION DOSE REDUCTION: This exam was performed according to the departmental dose-optimization program which includes automated exposure control, adjustment of the mA and/or kV according to patient size and/or use of iterative reconstruction technique. CONTRAST:  26m OMNIPAQUE IOHEXOL 350 MG/ML SOLN COMPARISON:  Head CT from earlier today FINDINGS: CTA NECK FINDINGS Aortic arch: Limited coverage without acute finding Right carotid system: Atheromatous calcification at the bifurcation. No stenosis or ulceration Left carotid system: Mild calcified plaque at the bifurcation. No stenosis or ulceration Vertebral arteries: Left dominant vertebral artery. The subclavian and vertebral arteries are smoothly contoured and widely patent. Skeleton: No acute finding. Other neck: No acute finding Upper chest: Intubation with unremarkable hardware where covered. Panlobular emphysema at the apices, severe. Review of the MIP images confirms the above findings CTA HEAD FINDINGS Anterior circulation: Atheromatous calcification along the carotid siphons. No branch occlusion, beading, or aneurysm Posterior circulation: Left dominant vertebral artery. Atheromatous irregularity with mild narrowing of the basilar. No branch occlusion, beading, or aneurysm. Venous sinuses: Unremarkable for the arterial phase Anatomic variants: None significant Review of the  MIP images confirms the above findings IMPRESSION: 1. No emergent finding. 2. Mild for age atherosclerosis without flow limiting stenosis or ulceration of major vessels. 3. Severe panlobular emphysema at the apices. Electronically Signed   By: JJorje GuildM.D.   On: 05/28/2022 05:28   CT HEAD WO CONTRAST (5MM)  Result Date: 05/28/2022 CLINICAL DATA:  Anisocoria EXAM: CT HEAD WITHOUT CONTRAST TECHNIQUE: Contiguous axial images were obtained from the base of the skull through the vertex without intravenous contrast. RADIATION DOSE REDUCTION: This exam was performed according to the departmental dose-optimization program  which includes automated exposure control, adjustment of the mA and/or kV according to patient size and/or use of iterative reconstruction technique. COMPARISON:  09/29/2003 FINDINGS: Brain: No mass, hemorrhage or extra-axial collection. There is focal hypoattenuation within the medial left cerebellar hemisphere. This could indicate a subacute infarct. Old left frontal infarct. There is periventricular hypoattenuation compatible with chronic microvascular disease. Mild generalized volume loss. Vascular: Atherosclerotic calcification of the internal carotid arteries at the skull base. Skull: Normal Sinuses/Orbits: Normal Other: None IMPRESSION: 1. Possible subacute infarct of the medial left cerebellar hemisphere. MRI is recommended. 2. Old left frontal infarct. Chronic ischemic microangiopathic white matter changes. These results will be called to the ordering clinician or representative by the Radiologist Assistant, and communication documented in the PACS or [employer]. Electronically Signed   By: Ulyses Jarred M.D.   On: 05/28/2022 02:10   DG Abd Portable 1V  Result Date: 05/27/2022 CLINICAL DATA:  NG tube placement EXAM: PORTABLE ABDOMEN - 1 VIEW COMPARISON:  Report of a CT scan from 2005. FINDINGS: Limited x-ray for tube placement includes the lower thorax and upper abdomen.  Enteric tube folded upon itself along the stomach. Gas is seen along nondilated loops of bowel elsewhere in the upper abdomen. Overlapping cardiac leads. IMPRESSION: Limited x-ray for tube placement has NG tube along the upper stomach Electronically Signed   By: Jill Side M.D.   On: 05/27/2022 17:26   DG CHEST PORT 1 VIEW  Result Date: 05/27/2022 CLINICAL DATA:  [id number] Encounter for intubation [id number] EXAM: PORTABLE CHEST 1 VIEW COMPARISON:  Radiograph 05/25/2022 FINDINGS: Endotracheal tube overlies the midthoracic trachea approximately 5.4 cm above the carina. Unchanged cardiomediastinal silhouette. Pulmonary hyperinflation. Emphysema. Unchanged bibasilar atelectasis or scarring. No new airspace disease. Unchanged linear metallic density overlying the right lower thorax, correlates with a foreign body in the anterior right lower chest wall on prior CT in November 2023. IMPRESSION: Endotracheal tube overlies the midthoracic trachea approximately 5.4 cm above the carina. Emphysema with bibasilar scarring and/or atelectasis. Electronically Signed   By: Maurine Simmering M.D.   On: 05/27/2022 14:48    Assessment: 72 y.o. female with PMH of COPD on home O2, depression/anxiety and breast cancer, former smoker admitted on 05/19/22 for respiratory failure. She had trach on 1/4 and PEG 1/23. Finally weaned off went now on trach collar, transfer out of ICU. On 1/3 she had R pupil 43m and L pupil 372m reactive to light, other neuro exam intact. Stat CT showed possible subacute infarct of the medial left cerebellar hemisphere. CTA head and neck negative. Repeat CT on 1/8 showed symmetric hypodensity in the central cerebellar white matter, similar to 05/28/2022. This could indicate chronic versus subacute ischemic changes. Echo 04/26/22 with EF 70-75%. LDL 27 and A1C 6.1. she is on ASA 81 and lipitor 40 now. Will request MRI to further evaluation   Plan: MRI brain Neuro checks Telemetry monitoring PT/OT/speech Avoid low  BP, BP goal normotensive GI and DVT prophylaxis  Continue ASA and statin Stroke risk factor modification Discussed with Dr. SiCandiss Norsettending physician We will follow  Thank you for this consultation and allowing us[place]oreao participate in the care of this patient.  JiRosalin HawkingMD PhD Stroke Neurology 06/26/2022 1:32 PM

## 2022-06-26 NOTE — Progress Notes (Signed)
PROGRESS NOTE                                                                                                                                                                                                             Patient Demographics:    Abigail Castillo, is a 72 y.o. female, DOB - 1950-09-19, YIR:[id number]  Outpatient Primary MD for the patient is [name], NP    LOS - 51  Admit date - 05/19/2022    Chief Complaint  Patient presents with   Respiratory Distress    BIB EMS from [facility] for resp distress, pt is on CPAP with EMS, on 4L satting 65%, tripodding, no air movement in lungs, A&O x4 at baseline, presenting with extreme fatigue, hx of COPD and asthma       Brief Narrative (HPI from H&P)   72 y.o. female who has a PMH as below including but not limited to COPD on chronic 2-3L O2 and '10mg'$  Prednisone, OSA on nocturnal BiPAP, and who is followed by Dr. Lamonte Sakai in our office. She had recent admission 11/15 through 12/18 for AECOPD and was then discharged to [facility].  She was recently seen as an outpatient by Dr. Lamonte Sakai on 12/22 and was prescribed a slow prednisone taper with instructions to drop to '10mg'$  and continue until her next follow up appointment.   She presented back to [facility] ED 12/25 with dyspnea and hypoxia down to 65%. She was found to have hypoxic and hypercapnic respiratory failure and her RSV swab returned positive. She was placed on BiPAP and was admitted by [facility].   On 12/26, PCCM was consulted continued respiratory failure requiring BiPAP and COPD management.  Underwent tracheostomy placement on 05/29/2022, she is currently on trach collar 10 L, still moving minimal air on exam, transferred to hospitalist service under my care on 06/26/2022 on day 38 of her hospital stay   Important events and procedures in ICU  From 04/26/2022 shows a preserved EF of 60% without any evidence of PFO  12/25  admit. 12/26 PCCM consult. 1/2 Failed BiPAP wean, Intubated 1/3 CT Head negative was completed for observed pupillary change, showing subacute infarcts 1/3 CTA Head and Neck - Non acute 1/4 tracheostomy 1/8 CT head for anisocoria; neg,  1/11 continuing progressive trach collar trials 1/12 BID trach collar trials, appears comfortable  1/14 failed PSV 1/15 pulse prednisone at 40 mg continues to fail SBT trials LTAC denied awaiting vent SNF 1/16 tolerated 1 hr atc. ASA placed on hold at request of IR for PEG.  1/23 PEG placement 1/25 placed back on full support for fatigue/ increased WOB after ATC x 25hrs, softer Bps 1/27 was able to tolerate trach collar for better part of prior day.  Rested at nighttime.  Stood with assistance 1/29 AM, indefinite trach collar    Subjective:    Abigail Castillo today has, No headache, No chest pain, No abdominal pain - No Nausea, No new weakness tingling or numbness, does have mild shortness of breath   Assessment  & Plan :   Acute on chronic respiratory failure with hypoxia/hypercarbia s/p trach, Acute On Chr. COPD exacerbation in the setting of RSV pneumonia - Advanced underlying COPD, required intubation followed by tracheostomy and prolonged vent support under the care of pulmonary critical care, currently on trach collar but may require ventilatory support if she tires down or fatigues, has finished a steroid taper, continue nebulizer treatments, Brovana, Yupelri, Pulmicort plus albuterol as needed.  Plan discussed with Dr. Erskine Emery pulmonary critical care on 06/26/2022.  Also involve palliative care for goals of care as she appears very demotivated, seems to have advanced underlying COPD with relatively tenuous situation.    Chronic anxiety & depression - Continue Klonopin, Zyprexa, BuSpar   Hypokalemia, resolved - monitor periodically; does not need daily labs   GERD -Pepcid   Breast cancer - Continue anastrozole   Acute/subacute L cerebellar  and L frontal stroke -Aspirin, statin, CT head and CTA head noted, CTA head and neck nonacute, recent echocardiogram stable with no PFO on bubble study, A1c is stable, LDL was above goal and she is currently on statin.  Case discussed with neuro team they will do a formal consult.  Will try MRI if needed by neuro provided patient can tolerated.  Continue supportive care.   Moderate malnutrition Plan: -start back on TF -PEG tube placemed 1/23, able to start using today -banatrol for diarrhea  Dyslipidemia.  Placed on statin.    Diabetes type 2, and hyperglycemia -SSI PRN -goal BG <180  Lab Results  Component Value Date   HGBA1C 6.1 (H) 05/25/2022   Lab Results  Component Value Date   CHOL 232 (H) 10/28/2021   HDL 52.90 10/28/2021   LDLCALC 158 (H) 10/28/2021   TRIG 103.0 10/28/2021   CHOLHDL 4 10/28/2021            Condition - Extremely Guarded  Family Communication  : None present  Code Status : Full code  Consults  : PCCM, palliative care  PUD Prophylaxis : Pepcid   Procedures  :           Disposition Plan  :    Status is: Inpatient  DVT Prophylaxis  :    enoxaparin (LOVENOX) injection 40 mg Start: 06/01/22 2132 Place and maintain sequential compression device Start: 05/25/22 0554   Lab Results  Component Value Date   PLT 216 06/25/2022    Diet :  Diet Order     None        Inpatient Medications  Scheduled Meds:  anastrozole  1 mg Per Tube q AM   arformoterol  15 mcg Nebulization BID   aspirin  81 mg Per Tube Daily   atorvastatin  40 mg Per Tube Daily   budesonide (PULMICORT) nebulizer solution  0.5 mg Nebulization BID  busPIRone  5 mg Per Tube TID   calcium carbonate  1 tablet Per Tube BID WC   Chlorhexidine Gluconate Cloth  6 each Topical Daily   clonazePAM  1 mg Per Tube QHS   cyanocobalamin  1,000 mcg Per Tube q AM   enoxaparin (LOVENOX) injection  40 mg Subcutaneous Q24H   famotidine  20 mg Per Tube Daily   feeding  supplement (OSMOLITE 1.5 CAL)  237 mL Per Tube 5 X Daily   feeding supplement (PROSource TF20)  60 mL Per Tube Daily   fiber supplement (BANATROL TF)  60 mL Per Tube TID   Gerhardt's butt cream   Topical TID   insulin aspart  0-6 Units Subcutaneous Q4H   insulin glargine-yfgn  8 Units Subcutaneous Daily   loratadine  10 mg Per Tube Daily   metoprolol tartrate  25 mg Per Tube BID   multivitamin with minerals  1 tablet Per Tube Daily   OLANZapine  10 mg Per Tube q AM   mouth rinse  15 mL Mouth Rinse 4 times per day   potassium chloride  20 mEq Per Tube Daily   revefenacin  175 mcg Nebulization Daily   sodium chloride flush  3 mL Intravenous Q12H   Continuous Infusions:  sodium chloride Stopped (06/16/22 2148)   PRN Meds:.sodium chloride, acetaminophen (TYLENOL) oral liquid 160 mg/5 mL, acetaminophen **OR** acetaminophen, bisacodyl, clonazepam, Gerhardt's butt cream, hydrALAZINE, iohexol, levalbuterol, lip balm, midazolam, ondansetron **OR** ondansetron (ZOFRAN) IV, mouth rinse, phenol, polyethylene glycol, white petrolatum   Objective:   Vitals:   06/26/22 0800 06/26/22 0815 06/26/22 0900 06/26/22 1000  BP: 126/85  (!) 143/115 111/67  Pulse: (!) 128  (!) 111 100  Resp: (!) '29  16 19  '$ Temp:  98 F (36.7 C)    TempSrc:  Oral    SpO2: 92%  100% 95%  Weight:      Height:        Wt Readings from Last 3 Encounters:  06/26/22 58.9 kg  04/26/22 67.8 kg  02/21/22 65.8 kg     Intake/Output Summary (Last 24 hours) at 06/26/2022 1035 Last data filed at 06/26/2022 0800 Gross per 24 hour  Intake 1333.29 ml  Output 750 ml  Net 583.29 ml     Physical Exam  Awake Alert, No new F.N deficits, anxious affect, trached and pegged Annetta.AT,PERRAL Supple Neck, No JVD,   Symmetrical Chest wall movement, minimal air movement bilaterally with mild expiratory wheezes, few crackles RRR,No Gallops,Rubs or new Murmurs,  +ve B.Sounds, Abd Soft, No tenderness,   No Cyanosis, Clubbing or edema      Data Review:    Recent Labs  Lab 06/25/22 0114  WBC 10.4  HGB 10.8*  HCT 35.8*  PLT 216  MCV 100.6*  MCH 30.3  MCHC 30.2  RDW 15.7*  LYMPHSABS 1.8  MONOABS 0.8  EOSABS 0.1  BASOSABS 0.0    Recent Labs  Lab 06/20/22 0447 06/21/22 0256 06/24/22 0312 06/25/22 0114 06/26/22 0819  NA 139 140 139 140 139  K 4.0 4.3 4.5 4.3 3.8  CL 94* 96* 95* 93* 93*  CO2 35* 33* 36* 34* 36*  ANIONGAP '10 11 8 13 10  '$ GLUCOSE 76 59* 91 141* 168*  BUN '20 22 19 18 17  '$ CREATININE 0.44 0.41* 0.45 0.50 0.50  AST  --   --   --  28  --   ALT  --   --   --  31  --  ALKPHOS  --   --   --  101  --   BILITOT  --   --   --  1.5*  --   ALBUMIN  --   --   --  3.0*  --   BNP  --   --   --   --  28.7  MG 2.0  --   --  1.8 1.9  CALCIUM 9.2 9.7 9.4 9.2 9.4      Radiology Reports DG Chest Port 1 View  Result Date: 06/26/2022 CLINICAL DATA:  Provided history: Shortness of breath. EXAM: PORTABLE CHEST 1 VIEW COMPARISON:  Prior chest radiographs 06/19/2022 and earlier. Chest CT 05/31/2022. FINDINGS: Tracheostomy tube in place. The cardiomediastinal silhouette is unchanged. Aortic atherosclerosis. Emphysema. 2.2 cm pulmonary nodular opacity at the level of the mid right lung, new. Interstitial opacities at both lung bases (increased on the right, similar on the left) as compared to the prior exam. No evidence of pleural effusion or pneumothorax. No acute bony abnormality identified. IMPRESSION: 1. 2.2 cm nodular pulmonary opacity at the level of the mid right lung, new from the prior examination of 06/19/2022 and likely infectious/inflammatory. 2. Interstitial opacities at both lung bases (increased on the right, similar on the left). This may reflect pulmonary edema or infection. 3.  Aortic Atherosclerosis (ICD10-I70.0). Electronically Signed   By: Kellie Simmering D.O.   On: 06/26/2022 08:42      Signature  -   Lala Lund M.D on 06/26/2022 at 10:35 AM   -  To page go to [email or web address]

## 2022-06-26 NOTE — Plan of Care (Signed)
Repeat CT formal report pending. On my review, there is no evidence of recent cerebellar infarcts. The previous concern of cerebellar infarct on CT likely to be artifact. No further stroke work up needed. OK to continue ASA and statin on discharge given stroke risk. Neurology will sign off. Please call with questions. Thanks for the consult.  Rosalin Hawking, MD PhD Stroke Neurology 06/26/2022 10:05 PM

## 2022-06-26 NOTE — Consult Note (Signed)
Consultation Note Date: 06/26/2022 at 1330   Patient Name: Abigail Castillo  DOB: 17-May-1951  MRN: [id number]  Age / Sex: 72 y.o., female  PCP: [name], NP Referring Physician: Thurnell Lose, MD  Reason for Consultation: Establishing goals of care  HPI/Patient Profile: 72 y.o. female  with past medical history of COPS, OSA (bipap), HTN, anxiety/depression, GERD, breast CA, DM2, and dysphagia admitted on 05/19/2022 with dyspnea and hypoxia. Pt found to be RSV+.   Pt is being treated for acute COPD exacerbation and RSV. Pt currently has trach and PEG.Neurology is following for concern of possible subacute infarct.   PMT was consulted to clarify and assist with goals of care.    Clinical Assessment and Goals of Care: I have reviewed medical records including EPIC notes, labs and imaging, assessed the patient and then met with patient at bedside to discuss diagnosis prognosis, GOC, EOL wishes, disposition and options. She decline to speak with me today, endorsing she is tired.  With patient's permission, I spoke with her daughter Abigail Castillo over the phone.   I introduced Palliative Medicine as specialized medical care for people living with serious illness. It focuses on providing relief from the symptoms and stress of a serious illness. The goal is to improve quality of life for both the patient and the family.  As far as functional and nutritional status Abigail Castillo says patient has had a marked decline over the past few months. She shares patient was improving at rehab in late December but that when she contracted RSV everything started to get worse.  We discussed patient's current illness and what it means in the larger context of patient's on-going co-morbidities.  Natural disease trajectory and chronic, progressive nature of COPD discussed.   I attempted to elicit values and goals of care important  to the patient. Abigail Castillo shares she is hopeful for the best but also realistic in knowing her mother might not get better. She would like for her mother to get stronger. We talked about how her mother's functional, nutritional, and cognitive decline on top of her chronic illnessess have contributed to her current debilitated state. We reviewed that patient was participatory with PT but had to take many breaks/rests.       Advance directives, concepts specific to code status, artificial feeding and hydration, and rehospitalization were considered and discussed. Abigail Castillo shares that patient is very resistant to speaking about anything negative about her health. Patient wants to accept anything that could help her stay alive - thus the trach/peg. Abigail Castillo shares that patient does not want to discuss death and dying as patient has made it clear she is accepting of all appropriate interventions to sustain her life. For now, full code and full scope remain - pending further discussion with patient.   Family is facing treatment option decisions, advanced directive, and anticipatory care needs.    Discussed with Abigail Castillo the importance of continued conversation with family and the medical providers regarding overall plan of care and treatment options, ensuring decisions are  within the context of the patient's values and GOCs.    Questions and concerns were addressed. Abigail Castillo was encouraged to call with questions or concerns and PMT contact info provided.  I plan to see the patient tomorrow and attempt [place] discussions again.   Primary Decision Maker PATIENT  Physical Exam Vitals reviewed.  Constitutional:      General: She is not in acute distress.    Appearance: She is not ill-appearing.  HENT:     Head: Normocephalic.  Eyes:     Pupils: Pupils are equal, round, and reactive to light.  Cardiovascular:     Rate and Rhythm: Normal rate.     Pulses: Normal pulses.  Pulmonary:     Comments: Trach  collar Abdominal:     Palpations: Abdomen is soft.  Musculoskeletal:     Comments: Generalize weakness  Skin:    General: Skin is warm and dry.  Neurological:     Mental Status: She is alert and oriented to person, place, and time.  Psychiatric:        Mood and Affect: Mood normal.        Behavior: Behavior normal.        Thought Content: Thought content normal.        Judgment: Judgment normal.     Palliative Assessment/Data: 50%     Thank you for this consult. Palliative medicine will continue to follow and assist holistically.   Time Total: 75 minutes Greater than 50%  of this time was spent counseling and coordinating care related to the above assessment and plan.  Signed by: Jordan Hawks, DNP, FNP-BC Palliative Medicine    Please contact Palliative Medicine Team phone at [phone] for questions and concerns.  For individual provider: See Shea Evans

## 2022-06-27 ENCOUNTER — Inpatient Hospital Stay (HOSPITAL_COMMUNITY): Payer: 59

## 2022-06-27 DIAGNOSIS — J9621 Acute and chronic respiratory failure with hypoxia: Secondary | ICD-10-CM | POA: Diagnosis not present

## 2022-06-27 DIAGNOSIS — J9622 Acute and chronic respiratory failure with hypercapnia: Secondary | ICD-10-CM | POA: Diagnosis not present

## 2022-06-27 LAB — BASIC METABOLIC PANEL
Anion gap: 9 (ref 5–15)
BUN: 17 mg/dL (ref 8–23)
CO2: 36 mmol/L — ABNORMAL HIGH (ref 22–32)
Calcium: 9.3 mg/dL (ref 8.9–10.3)
Chloride: 97 mmol/L — ABNORMAL LOW (ref 98–111)
Creatinine, Ser: 0.5 mg/dL (ref 0.44–1.00)
GFR, Estimated: >60 mL/min (ref >60)
Glucose, Bld: 145 mg/dL — ABNORMAL HIGH (ref 70–99)
Potassium: 3.8 mmol/L (ref 3.5–5.1)
Sodium: 142 mmol/L (ref 135–145)

## 2022-06-27 LAB — BRAIN NATRIURETIC PEPTIDE: B Natriuretic Peptide: 46.5 pg/mL (ref 0.0–100.0)

## 2022-06-27 LAB — MAGNESIUM: Magnesium: 1.7 mg/dL (ref 1.7–2.4)

## 2022-06-27 LAB — BLOOD GAS, ARTERIAL
Acid-Base Excess: 17.1 mmol/L — ABNORMAL HIGH (ref 0.0–2.0)
Bicarbonate: 45.1 mmol/L — ABNORMAL HIGH (ref 20.0–28.0)
Drawn by: 40662
O2 Saturation: 100 %
Patient temperature: 37.1
pCO2 arterial: 68 mmHg (ref 32–48)
pH, Arterial: 7.43 (ref 7.35–7.45)
pO2, Arterial: 133 mmHg — ABNORMAL HIGH (ref 83–108)

## 2022-06-27 LAB — CBC WITH DIFFERENTIAL/PLATELET
Abs Immature Granulocytes: 0.07 10*3/uL (ref 0.00–0.07)
Basophils Absolute: 0 10*3/uL (ref 0.0–0.1)
Basophils Relative: 0 %
Eosinophils Absolute: 0.1 10*3/uL (ref 0.0–0.5)
Eosinophils Relative: 1 %
HCT: 33.3 % — ABNORMAL LOW (ref 36.0–46.0)
Hemoglobin: 10.3 g/dL — ABNORMAL LOW (ref 12.0–15.0)
Immature Granulocytes: 1 %
Lymphocytes Relative: 18 %
Lymphs Abs: 1.7 10*3/uL (ref 0.7–4.0)
MCH: 31.1 pg (ref 26.0–34.0)
MCHC: 30.9 g/dL (ref 30.0–36.0)
MCV: 100.6 fL — ABNORMAL HIGH (ref 80.0–100.0)
Monocytes Absolute: 0.7 10*3/uL (ref 0.1–1.0)
Monocytes Relative: 7 %
Neutro Abs: 6.8 10*3/uL (ref 1.7–7.7)
Neutrophils Relative %: 73 %
Platelets: 173 10*3/uL (ref 150–400)
RBC: 3.31 MIL/uL — ABNORMAL LOW (ref 3.87–5.11)
RDW: 16 % — ABNORMAL HIGH (ref 11.5–15.5)
WBC: 9.4 10*3/uL (ref 4.0–10.5)
nRBC: 0 % (ref 0.0–0.2)

## 2022-06-27 LAB — GLUCOSE, CAPILLARY
Glucose-Capillary: 104 mg/dL — ABNORMAL HIGH (ref 70–99)
Glucose-Capillary: 115 mg/dL — ABNORMAL HIGH (ref 70–99)
Glucose-Capillary: 119 mg/dL — ABNORMAL HIGH (ref 70–99)
Glucose-Capillary: 123 mg/dL — ABNORMAL HIGH (ref 70–99)
Glucose-Capillary: 124 mg/dL — ABNORMAL HIGH (ref 70–99)
Glucose-Capillary: 168 mg/dL — ABNORMAL HIGH (ref 70–99)
Glucose-Capillary: 169 mg/dL — ABNORMAL HIGH (ref 70–99)
Glucose-Capillary: 208 mg/dL — ABNORMAL HIGH (ref 70–99)

## 2022-06-27 MED ORDER — POTASSIUM CHLORIDE 20 MEQ PO PACK
40.0000 meq | PACK | Freq: Once | ORAL | Status: AC
  Administered 2022-06-27: 40 meq
  Filled 2022-06-27: qty 2

## 2022-06-27 MED ORDER — MAGNESIUM SULFATE 2 GM/50ML IV SOLN
2.0000 g | Freq: Once | INTRAVENOUS | Status: AC
  Administered 2022-06-27: 2 g via INTRAVENOUS
  Filled 2022-06-27: qty 50

## 2022-06-27 MED ORDER — FUROSEMIDE 10 MG/ML IJ SOLN
40.0000 mg | Freq: Once | INTRAMUSCULAR | Status: AC
  Administered 2022-06-27: 40 mg via INTRAVENOUS
  Filled 2022-06-27: qty 4

## 2022-06-27 MED ORDER — ORAL CARE MOUTH RINSE
15.0000 mL | OROMUCOSAL | Status: DC
  Administered 2022-06-27 – 2022-07-04 (×72): 15 mL via OROMUCOSAL

## 2022-06-27 MED ORDER — ORAL CARE MOUTH RINSE: 15.0000 mL | OROMUCOSAL | Status: DC | PRN

## 2022-06-27 NOTE — Progress Notes (Signed)
Rapid response was called by RT on this pt due to increased work of breathing and desatting. Upon entering pt's room, accessory muscles noted with pt's breathing and pt was lethargic. RN and rapid response nurse at bedside.

## 2022-06-27 NOTE — Significant Event (Signed)
Rapid Response Event Note   Reason for Call :  Decreased LOC, grey appearing, acute respiratory distress, SpO2 84% on 10L/70% TC  RT provided lavage with no secretions suctioned out, PRN Xopenex given, and supplemental oxygen increased to 15L/100% TC.   Initial Focused Assessment:  Pt lying in bed, lethargic. Nodding appropriately. Grey color, mild diaphoresis. Upper airway accessory muscle use. Minimal air movement heard, shallow respirations. Otherwise lung sounds are clear.   VS: T 98.35F, BP 121/75, HR 93, RR 25, SpO2 93% on TC 15L/100% CBG: 115  Interventions:  -Xopenex -ABG -CXR  Plan of Care:  Transfer to ICU  Event Summary:  MD Notified: Dr. Candiss Norse Call Time: [place] Time: 9924 End Time: [place], RN

## 2022-06-27 NOTE — Progress Notes (Signed)
NAME:  Abigail Castillo, MRN:  [id number], DOB:  [birth year], LOS: 3 ADMISSION DATE:  05/19/2022, CONSULTATION DATE:  05/20/22 REFERRING MD:  Roger Shelter CHIEF COMPLAINT:  Dyspnea   History of Present Illness:  Abigail Castillo is a 72 y.o. female who has a PMH as below including but not limited to COPD on chronic 2-3L O2 and '10mg'$  Prednisone, OSA on nocturnal BiPAP, and who is followed by Dr. Lamonte Sakai in our office. She had recent admission 11/15 through 12/18 for AECOPD and was then discharged to [facility].  She was recently seen as an outpatient by Dr. Lamonte Sakai on 12/22 and was prescribed a slow prednisone taper with instructions to drop to '10mg'$  and continue until her next follow up appointment.  She presented back to [facility] ED 12/25 with dyspnea and hypoxia down to 65%. She was found to have hypoxic and hypercapnic respiratory failure and her RSV swab returned positive. She was placed on BiPAP and was admitted by [facility].  On 12/26, PCCM was consulted for assistance with BiPAP and COPD management.  Pertinent  Medical History:  has History of tobacco abuse; Depression; COPD (chronic obstructive pulmonary disease) ([place]); Osteoporosis; HOARSENESS, CHRONIC; HYPERTRIGLYCERIDEMIA; Memory change; B12 deficiency; Personal history of colonic polyps; Encounter for routine gynecological examination; Mild hyperlipidemia; Vitamin D deficiency; Esophageal stricture; GERD (gastroesophageal reflux disease); Schizoaffective disorder ([place]); Allergic rhinitis; Chronic respiratory failure ([place]); Acute on chronic respiratory failure with hypoxia ([place]); Elevated blood pressure reading without diagnosis of hypertension; Hepatic steatosis; Ductal carcinoma in situ (DCIS) of left breast; On home oxygen therapy; Hyponatremia; Bronchomalacia; Localized edema; COPD exacerbation ([place]); Tracheobronchomalacia; Acute on chronic respiratory failure with hypoxia and hypercapnia (HCC); COPD with acute exacerbation ([place]); RSV (respiratory syncytial  virus pneumonia); Essential hypertension; Malnutrition of moderate degree; Acute respiratory failure with hypercapnia ([place]); Pressure injury of skin; and Tracheostomy in place [facility]) on their problem list.  Significant Hospital Events: Including procedures, antibiotic start and stop dates in addition to other pertinent events   12/25 admit. 12/26 PCCM consult. 1/2 Failed BiPAP wean, Intubated 1/3 CT Head negative was completed for observed pupillary change 1/4 tracheostomy 1/8 CT head for anisocoria; neg,  1/11 continuing progressive trach collar trials 1/12 BID trach collar trials, appears comfortable  1/14 failed PSV 1/15 pulse prednisone at 40 mg continues to fail SBT trials LTAC denied awaiting vent SNF 1/16 tolerated 1 hr atc. ASA placed on hold at request of IR for PEG.  1/23 PEG placement 1/25 placed back on full support for fatigue/ increased WOB after ATC x 25hrs, softer Bps 1/27 was able to tolerate trach collar for better part of prior day.  Rested at nighttime.  Stood with assistance 1/29 AM, indefinite trach collar  Interim History / Subjective:  Unfortunately more SOB, now on 15LPM TC, tiring out.  Objective:  Blood pressure 111/68, pulse 93, temperature 98.2 F (36.8 C), temperature source Axillary, resp. rate (!) 23, height '5\' 2"'$  (1.575 m), weight 58.8 kg, SpO2 97 %.    FiO2 (%):  [40 %-100 %] 100 %   Intake/Output Summary (Last 24 hours) at 06/27/2022 1318 Last data filed at 06/26/2022 2030 Gross per 24 hour  Intake 80 ml  Output 400 ml  Net -320 ml    Filed Weights   06/25/22 0410 06/26/22 0500 06/27/22 0500  Weight: 58.8 kg 58.9 kg 58.8 kg   Physical Examination: Mild-moderate resp distress Using accessory muscles Severely diminished breath sounds Ext warm Moves ext to command Mentating  CBC looks okay BMP  benign CXR personally reviewed: no real changes ABG compensated for now  Ancillary tests personally reviewed  CO2 33   Assessment & Plan:   Acute on chronic respiratory failure with hypoxia/hypercarbia s/p trach- lingering issue is deconditioning.  Unfortunately has tired out. Acute COPD exacerbation in the setting of RSV pneumonia- resolved Hx HTN- controlled on current regimen Chronic anxiety & depression- controlled on current regimen buspar/klonipin GERD Breast cancer- on arimidex L cerebellar and L frontal stroke- age indeterminate found on this admit; CTA head/neck with nothing actionable. Tele w/o afib. Cannot get MRI due to hardware.   On asa/statin.  Likely contributing along with deconditioning to her ambulatory issues. Diabetes type 2, and hyperglycemia- controlled on current regimen Moderate malnutrition, dysphagia- now on nightly TF  - Back to ICU for vent qHS and PRN - Basal bolus insulin - qHS TF - Nebs as ordered - Consider CT of chest in am if nodular opacity on CXR does not clear  Stable for transfer to vent SNF  Erskine Emery MD PCCM

## 2022-06-27 NOTE — Care Management Important Message (Signed)
Important Message  Patient Details  Name: Abigail Castillo MRN: [id number] Date of Birth: [birth year]   Medicare Important Message Given:  Yes  Patient left prior to IM delivery will mail to the patient home address.    Quandarius Nill 06/27/2022, 1:36 PM

## 2022-06-27 NOTE — Progress Notes (Signed)
Pt has bed assigned on 6M. Nurse will call to give report and then transport pt to the unit.

## 2022-06-27 NOTE — Progress Notes (Addendum)
PROGRESS NOTE                                                                                                                                                                                                             Patient Demographics:    Abigail Castillo, is a 72 y.o. female, DOB - 10-Oct-1950, SWF:[id number]  Outpatient Primary MD for the patient is [name], NP    LOS - 76  Admit date - 05/19/2022    Chief Complaint  Patient presents with   Respiratory Distress    BIB EMS from [facility] for resp distress, pt is on CPAP with EMS, on 4L satting 65%, tripodding, no air movement in lungs, A&O x4 at baseline, presenting with extreme fatigue, hx of COPD and asthma       Brief Narrative (HPI from H&P)   72 y.o. female who has a PMH as below including but not limited to COPD on chronic 2-3L O2 and '10mg'$  Prednisone, OSA on nocturnal BiPAP, and who is followed by Dr. Lamonte Sakai in our office. She had recent admission 11/15 through 12/18 for AECOPD and was then discharged to [facility].  She was recently seen as an outpatient by Dr. Lamonte Sakai on 12/22 and was prescribed a slow prednisone taper with instructions to drop to '10mg'$  and continue until her next follow up appointment.   She presented back to [facility] ED 12/25 with dyspnea and hypoxia down to 65%. She was found to have hypoxic and hypercapnic respiratory failure and her RSV swab returned positive. She was placed on BiPAP and was admitted by [facility].   On 12/26, PCCM was consulted continued respiratory failure requiring BiPAP and COPD management.  Underwent tracheostomy placement on 05/29/2022, she is currently on trach collar 10 L, still moving minimal air on exam, transferred to hospitalist service under my care on 06/26/2022 on day 38 of her hospital stay   Important events and procedures in ICU  From 04/26/2022 shows a preserved EF of 60% without any evidence of PFO  12/25  admit. 12/26 PCCM consult. 1/2 Failed BiPAP wean, Intubated 1/3 CT Head negative was completed for observed pupillary change, showing subacute infarcts 1/3 CTA Head and Neck - Non acute 1/4 tracheostomy 1/8 CT head for anisocoria; neg,  1/11 continuing progressive trach collar trials 1/12 BID trach collar trials, appears comfortable  1/14 failed PSV 1/15 pulse prednisone at 40 mg continues to fail SBT trials LTAC denied awaiting vent SNF 1/16 tolerated 1 hr atc. ASA placed on hold at request of IR for PEG.  1/23 PEG placement 1/25 placed back on full support for fatigue/ increased WOB after ATC x 25hrs, softer Bps 1/27 was able to tolerate trach collar for better part of prior day.  Rested at nighttime.  Stood with assistance 1/29 AM, indefinite trach collar    Subjective:   Patient in bed, appears comfortable, denies any headache, no fever, no chest pain or pressure,  improved shortness of breath , no abdominal pain. No new focal weakness.   Assessment  & Plan :   Acute on chronic respiratory failure with hypoxia/hypercarbia s/p trach, Acute On Chr. COPD exacerbation in the setting of RSV pneumonia - Advanced underlying COPD, required intubation followed by tracheostomy and prolonged vent support under the care of pulmonary critical care, currently on trach collar but may require ventilatory support if she tires down or fatigues, has finished a steroid taper, continue nebulizer treatments, Brovana, Yupelri, Pulmicort plus albuterol as needed.  Plan discussed with Dr. Erskine Emery pulmonary critical care on 06/26/2022.  Also involve palliative care for goals of care as she appears very demotivated, seems to have advanced underlying COPD with remains relatively tenuous situation.   Addendum 12:10 PM.  Was paged by nursing staff that patient around 1145 became less responsive and more short of breath, rapid response was called, patient was found to be more lethargic and short of breath, moving  moving minimal air bilaterally, ABG chest x-ray ordered, case discussed with Dr. Erskine Emery, ICU, she will be transferred back to ICU and placed back on ventilator.  Again remains quite tenuous.  Long-term prognosis does not appear to be good.    Chronic anxiety & depression - Continue Klonopin, Zyprexa, BuSpar   Hypokalemia, resolved - monitor periodically; does not need daily labs   GERD -Pepcid   Breast cancer - Continue anastrozole   Acute/subacute L cerebellar and L frontal stroke -Aspirin, statin, CT head and CTA head noted, CTA head and neck nonacute, recent echocardiogram stable with no PFO on bubble study, A1c is stable, LDL was above goal and was placed on statin.  Seen by neurology CT scan repeated on 06/26/2022 showing stable left frontal lobe infarct.  Per neurology continue aspirin and statin for discharge.  No further workup.   Moderate malnutrition Plan: -start back on TF -PEG tube placemed 1/23  -banatrol for diarrhea  Dyslipidemia.  Placed on statin.    Diabetes type 2, and hyperglycemia -SSI PRN -goal BG <180  Lab Results  Component Value Date   HGBA1C 6.1 (H) 05/25/2022   Lab Results  Component Value Date   CHOL 96 06/26/2022   HDL 43 06/26/2022   LDLCALC 27 06/26/2022   TRIG 131 06/26/2022   CHOLHDL 2.2 06/26/2022            Condition - Extremely Guarded  Family Communication  : None present  Code Status : Full code  Consults  : PCCM, palliative care and neurology  PUD Prophylaxis : Pepcid   Procedures  :           Disposition Plan  :    Status is: Inpatient  DVT Prophylaxis  :    enoxaparin (LOVENOX) injection 40 mg Start: 06/01/22 2132 Place and maintain sequential compression device Start: 05/25/22 0554   Lab Results  Component Value Date  PLT 173 06/27/2022    Diet :  Diet Order     None        Inpatient Medications  Scheduled Meds:  anastrozole  1 mg Per Tube q AM   arformoterol  15 mcg Nebulization BID    aspirin  81 mg Per Tube Daily   atorvastatin  40 mg Per Tube Daily   budesonide (PULMICORT) nebulizer solution  0.5 mg Nebulization BID   busPIRone  5 mg Per Tube TID   calcium carbonate  1 tablet Per Tube BID WC   Chlorhexidine Gluconate Cloth  6 each Topical Daily   clonazePAM  1 mg Per Tube QHS   cyanocobalamin  1,000 mcg Per Tube q AM   enoxaparin (LOVENOX) injection  40 mg Subcutaneous Q24H   famotidine  20 mg Per Tube Daily   feeding supplement (OSMOLITE 1.5 CAL)  237 mL Per Tube 5 X Daily   feeding supplement (PROSource TF20)  60 mL Per Tube Daily   fiber supplement (BANATROL TF)  60 mL Per Tube TID   furosemide  40 mg Intravenous Once   Gerhardt's butt cream   Topical TID   insulin aspart  0-6 Units Subcutaneous Q4H   insulin glargine-yfgn  8 Units Subcutaneous Daily   loratadine  10 mg Per Tube Daily   metoprolol tartrate  25 mg Per Tube BID   multivitamin with minerals  1 tablet Per Tube Daily   OLANZapine  10 mg Per Tube q AM   mouth rinse  15 mL Mouth Rinse 4 times per day   potassium chloride  40 mEq Per Tube Once   revefenacin  175 mcg Nebulization Daily   sodium chloride flush  3 mL Intravenous Q12H   Continuous Infusions:  sodium chloride Stopped (06/16/22 2148)   magnesium sulfate bolus IVPB     PRN Meds:.sodium chloride, acetaminophen (TYLENOL) oral liquid 160 mg/5 mL, acetaminophen **OR** acetaminophen, bisacodyl, clonazepam, Gerhardt's butt cream, hydrALAZINE, iohexol, levalbuterol, lip balm, midazolam, ondansetron **OR** ondansetron (ZOFRAN) IV, mouth rinse, phenol, polyethylene glycol, white petrolatum   Objective:   Vitals:   06/27/22 0500 06/27/22 0550 06/27/22 0736 06/27/22 0855  BP:  106/83 106/71 102/66  Pulse:  93  99  Resp:  20 (!) 23 (!) 22  Temp:  99.3 F (37.4 C)  97.6 F (36.4 C)  TempSrc:  Oral  Oral  SpO2:  96% 96% 99%  Weight: 58.8 kg     Height:        Wt Readings from Last 3 Encounters:  06/27/22 58.8 kg  04/26/22 67.8 kg   02/21/22 65.8 kg     Intake/Output Summary (Last 24 hours) at 06/27/2022 1046 Last data filed at 06/26/2022 2030 Gross per 24 hour  Intake 397 ml  Output 400 ml  Net -3 ml     Physical Exam  Awake Alert, No new F.N deficits, anxious affect, trached and pegged [place].AT,PERRAL Supple Neck, No JVD,   Symmetrical Chest wall movement, moderate air movement bilaterally with mild expiratory wheezes, few crackles RRR,No Gallops,Rubs or new Murmurs,  +ve B.Sounds, Abd Soft, No tenderness,   No Cyanosis, Clubbing or edema     Data Review:    Recent Labs  Lab 06/25/22 0114 06/27/22 0657  WBC 10.4 9.4  HGB 10.8* 10.3*  HCT 35.8* 33.3*  PLT 216 173  MCV 100.6* 100.6*  MCH 30.3 31.1  MCHC 30.2 30.9  RDW 15.7* 16.0*  LYMPHSABS 1.8 1.7  MONOABS 0.8 0.7  EOSABS  0.1 0.1  BASOSABS 0.0 0.0    Recent Labs  Lab 06/21/22 0256 06/24/22 0312 06/25/22 0114 06/26/22 0819 06/27/22 0657  NA 140 139 140 139 142  K 4.3 4.5 4.3 3.8 3.8  CL 96* 95* 93* 93* 97*  CO2 33* 36* 34* 36* 36*  ANIONGAP '11 8 13 10 9  '$ GLUCOSE 59* 91 141* 168* 145*  BUN '22 19 18 17 17  '$ CREATININE 0.41* 0.45 0.50 0.50 0.50  AST  --   --  28  --   --   ALT  --   --  31  --   --   ALKPHOS  --   --  101  --   --   BILITOT  --   --  1.5*  --   --   ALBUMIN  --   --  3.0*  --   --   BNP  --   --   --  28.7 46.5  MG  --   --  1.8 1.9 1.7  CALCIUM 9.7 9.4 9.2 9.4 9.3      Radiology Reports CT HEAD WO CONTRAST (5MM)  Result Date: 06/27/2022 CLINICAL DATA:  Stroke follow-up EXAM: CT HEAD WITHOUT CONTRAST TECHNIQUE: Contiguous axial images were obtained from the base of the skull through the vertex without intravenous contrast. RADIATION DOSE REDUCTION: This exam was performed according to the departmental dose-optimization program which includes automated exposure control, adjustment of the mA and/or kV according to patient size and/or use of iterative reconstruction technique. COMPARISON:  06/02/2022 FINDINGS: Brain:  Unchanged appearance of left frontal infarct. There is periventricular hypoattenuation compatible with chronic microvascular disease. Mild generalized volume loss. No hemorrhage or extra-axial collection. Vascular: No hyperdense vessel or unexpected calcification. Skull: Normal. Negative for fracture or focal lesion. Sinuses/Orbits: No acute finding. Other: None. IMPRESSION: Unchanged appearance of left frontal infarct.  No acute findings. Electronically Signed   By: Ulyses Jarred M.D.   On: 06/27/2022 01:29   DG Chest Port 1 View  Result Date: 06/26/2022 CLINICAL DATA:  Provided history: Shortness of breath. EXAM: PORTABLE CHEST 1 VIEW COMPARISON:  Prior chest radiographs 06/19/2022 and earlier. Chest CT 05/31/2022. FINDINGS: Tracheostomy tube in place. The cardiomediastinal silhouette is unchanged. Aortic atherosclerosis. Emphysema. 2.2 cm pulmonary nodular opacity at the level of the mid right lung, new. Interstitial opacities at both lung bases (increased on the right, similar on the left) as compared to the prior exam. No evidence of pleural effusion or pneumothorax. No acute bony abnormality identified. IMPRESSION: 1. 2.2 cm nodular pulmonary opacity at the level of the mid right lung, new from the prior examination of 06/19/2022 and likely infectious/inflammatory. 2. Interstitial opacities at both lung bases (increased on the right, similar on the left). This may reflect pulmonary edema or infection. 3.  Aortic Atherosclerosis (ICD10-I70.0). Electronically Signed   By: Kellie Simmering D.O.   On: 06/26/2022 08:42      Signature  -   Lala Lund M.D on 06/27/2022 at 10:46 AM   -  To page go to [email or web address]

## 2022-06-27 NOTE — Procedures (Signed)
Called to bedside to assess for cuff leak on trach.  Pt not getting volumes.  RT at bedside. Trach changed via exchanger, with RT.  Confirmed with end tidal CO2 detector and getting normal volumes on vent. Bilateral breath sounds.      Noe Gens, MSN, APRN, NP-C, AGACNP-BC [facility] 06/27/2022, 2:53 PM   Please see [email or web address] for pager details.   From 7A-7P if no response, please call [phone] After hours, please call ELink [phone]

## 2022-06-27 NOTE — Progress Notes (Signed)
SLP Cancellation Note  Patient Details Name: Abigail Castillo MRN: [id number] DOB: [birth year]   Cancelled treatment:        Attempted to see for Passy-Muir speaking valve however pt had rapid response due to decreased LOC, grey appearing, acute respiratory distress, SpO2 84% on 10L/70% TC. RN reported she had a cuff leak, just underwent trach exchange and is now on vent support. Will continue efforts.    Houston Siren 06/27/2022, 1:59 PM

## 2022-06-27 NOTE — Progress Notes (Signed)
OT Cancellation Note  Patient Details Name: MARGORIE RENNER MRN: [id number] DOB: [birth year]   Cancelled Treatment:    Reason Eval/Treat Not Completed: Medical issues which prohibited therapy pt with rapid event; grey appearing, acute respiratory distress, SpO2 84% on 10L/70% TC now in ICU. Will continue efforts for OT session as pt medically appropriate.   Harley Alto., COTA/L Acute Rehabilitation Services [phone]   Precious Haws 06/27/2022, 2:04 PM

## 2022-06-27 NOTE — Procedures (Addendum)
Tracheostomy Change Note  Patient Details:   Name: MERRIAM BRANDNER DOB: Feb 05, 1951 MRN: [id number]    Airway Documentation:     Evaluation  O2 sats: stable throughout Complications: No apparent complications Patient did tolerate procedure well. Bilateral Breath Sounds: Rhonchi, Diminished   Pt had low Vte and low Mve even with inflating cuff and repositioning. PA and this RT changed Pt's trach. Pt now has Shiley Flex 63m cuffed. Good color change on CO2 detector and bilateral breathsounds were auscultated. Pt is currently stable and receiving good volumes on ventilator. RFelecia Jan2/06/2022, 2:06 PM

## 2022-06-27 NOTE — TOC Progression Note (Signed)
Transition of Care [facility]) - Progression Note    Patient Details  Name: Abigail Castillo MRN: [id number] Date of Birth: 1950/10/30  Transition of Care [facility]) CM/SW [place], LCSW Phone Number: 06/27/2022, 8:53 AM  Clinical Narrative:    Patient transferred to 5W. CSW will continue to follow for medical readiness for SNF referral. Patient used about 8 of her 20 non-copay SNF days at [facility] but she does have a dual Medicaid plan. [facility] unable to accept patient back with tracheostomy so will be conducting search for a new facility.    Expected Discharge Plan: [place] Barriers to Discharge: Ship broker, Continued Medical Work up, SNF Pending bed offer  Expected Discharge Plan and Services In-house Referral: Clinical Social Work   Post Acute Care Choice: Long Term Acute Care (LTAC) Living arrangements for the past 2 months:  (PTA came from [place] short term before that was from home alone)                                       Social Determinants of Health (SDOH) Interventions [place]: No Food Insecurity (05/30/2022)  Housing: Low Risk  (05/30/2022)  Transportation Needs: No Transportation Needs (05/30/2022)  Utilities: Not At Risk (05/30/2022)  Alcohol Screen: Low Risk  (03/26/2021)  Depression (PHQ2-9): Low Risk  (03/26/2021)  Financial Resource Strain: Low Risk  (03/26/2021)  Physical Activity: Inactive (03/26/2021)  Social Connections: Moderately Isolated (03/26/2021)  Stress: No Stress Concern Present (03/26/2021)  Tobacco Use: Medium Risk (06/17/2022)    Readmission Risk Interventions     No data to display

## 2022-06-28 ENCOUNTER — Inpatient Hospital Stay (HOSPITAL_COMMUNITY): Payer: 59

## 2022-06-28 DIAGNOSIS — J9621 Acute and chronic respiratory failure with hypoxia: Secondary | ICD-10-CM | POA: Diagnosis not present

## 2022-06-28 DIAGNOSIS — Z515 Encounter for palliative care: Secondary | ICD-10-CM | POA: Diagnosis not present

## 2022-06-28 DIAGNOSIS — J441 Chronic obstructive pulmonary disease with (acute) exacerbation: Secondary | ICD-10-CM | POA: Diagnosis not present

## 2022-06-28 DIAGNOSIS — J9622 Acute and chronic respiratory failure with hypercapnia: Secondary | ICD-10-CM | POA: Diagnosis not present

## 2022-06-28 LAB — BASIC METABOLIC PANEL
Anion gap: 8 (ref 5–15)
BUN: 27 mg/dL — ABNORMAL HIGH (ref 8–23)
CO2: 37 mmol/L — ABNORMAL HIGH (ref 22–32)
Calcium: 10 mg/dL (ref 8.9–10.3)
Chloride: 98 mmol/L (ref 98–111)
Creatinine, Ser: 0.55 mg/dL (ref 0.44–1.00)
GFR, Estimated: >60 mL/min (ref >60)
Glucose, Bld: 95 mg/dL (ref 70–99)
Potassium: 4.3 mmol/L (ref 3.5–5.1)
Sodium: 143 mmol/L (ref 135–145)

## 2022-06-28 LAB — CBC WITH DIFFERENTIAL/PLATELET
Abs Immature Granulocytes: 0.04 10*3/uL (ref 0.00–0.07)
Basophils Absolute: 0 10*3/uL (ref 0.0–0.1)
Basophils Relative: 0 %
Eosinophils Absolute: 0.1 10*3/uL (ref 0.0–0.5)
Eosinophils Relative: 1 %
HCT: 34.2 % — ABNORMAL LOW (ref 36.0–46.0)
Hemoglobin: 10.7 g/dL — ABNORMAL LOW (ref 12.0–15.0)
Immature Granulocytes: 1 %
Lymphocytes Relative: 20 %
Lymphs Abs: 1.5 10*3/uL (ref 0.7–4.0)
MCH: 31.2 pg (ref 26.0–34.0)
MCHC: 31.3 g/dL (ref 30.0–36.0)
MCV: 99.7 fL (ref 80.0–100.0)
Monocytes Absolute: 0.7 10*3/uL (ref 0.1–1.0)
Monocytes Relative: 9 %
Neutro Abs: 5.3 10*3/uL (ref 1.7–7.7)
Neutrophils Relative %: 69 %
Platelets: 177 10*3/uL (ref 150–400)
RBC: 3.43 MIL/uL — ABNORMAL LOW (ref 3.87–5.11)
RDW: 16.3 % — ABNORMAL HIGH (ref 11.5–15.5)
WBC: 7.6 10*3/uL (ref 4.0–10.5)
nRBC: 0 % (ref 0.0–0.2)

## 2022-06-28 LAB — GLUCOSE, CAPILLARY
Glucose-Capillary: 110 mg/dL — ABNORMAL HIGH (ref 70–99)
Glucose-Capillary: 118 mg/dL — ABNORMAL HIGH (ref 70–99)
Glucose-Capillary: 136 mg/dL — ABNORMAL HIGH (ref 70–99)
Glucose-Capillary: 157 mg/dL — ABNORMAL HIGH (ref 70–99)
Glucose-Capillary: 196 mg/dL — ABNORMAL HIGH (ref 70–99)
Glucose-Capillary: 99 mg/dL (ref 70–99)

## 2022-06-28 LAB — MAGNESIUM: Magnesium: 2.2 mg/dL (ref 1.7–2.4)

## 2022-06-28 LAB — BRAIN NATRIURETIC PEPTIDE: B Natriuretic Peptide: 29.4 pg/mL (ref 0.0–100.0)

## 2022-06-28 MED ORDER — CHLORHEXIDINE GLUCONATE CLOTH 2 % EX PADS
6.0000 | MEDICATED_PAD | Freq: Every day | CUTANEOUS | Status: DC
  Administered 2022-06-28 – 2022-07-04 (×7): 6 via TOPICAL

## 2022-06-28 NOTE — Progress Notes (Signed)
Occupational Therapy Treatment Patient Details Name: Abigail Castillo MRN: [id number] DOB: [birth year] Today's Date: 06/28/2022   History of present illness Abigail Castillo is a 72 y.o. F recently admitted for COPD flare, discharged to SNF who returned due to SpO2 65%. Found to be  RSV+, pCO2 80, started on BiPAP.   Head CT showing possible subacute infarct of the medial left cerebellar hemisphere. Failed bipap wean on 1/2, intubated. Tracheostomy 1/4.Transferred back to ICU and placed back on vent due to respiratory issues. Pt with COPD and chronic respiratory failure on 2-3L home O2, depression/anxiety and hx BrCA   OT comments  This 72 yo female seen after transferring to ICU due to respiratory issues. Pt appears to be at same levels of assist if not a little better than when last seen by OT. She was able to sit EOB with min guard A, wash face while seated at min guard A, stand with +2 min A and take lateral side steps up towards HOB with Mod A +2. She will continue to benefit from acute OT with follow up at [facility].    Recommendations for follow up therapy are one component of a multi-disciplinary discharge planning process, led by the attending physician.  Recommendations may be updated based on patient status, additional functional criteria and insurance authorization.    Follow Up Recommendations  OT at Long-term acute care hospital     Assistance Recommended at Discharge Frequent or constant Supervision/Assistance  Patient can return home with the following  Two people to help with walking and/or transfers;Two people to help with bathing/dressing/bathroom;Assistance with feeding;Direct supervision/assist for medications management;Assistance with cooking/housework;Direct supervision/assist for financial management;Assist for transportation;Help with stairs or ramp for entrance   Equipment Recommendations  Other (comment) (TBD next venue)       Precautions / Restrictions  Precautions Precautions: Fall Precaution Comments: vent Restrictions Weight Bearing Restrictions: No       Mobility Bed Mobility Overal bed mobility: Needs Assistance Bed Mobility: Supine to Sit, Sit to Supine   Sidelying to sit: Min assist (A for trunk) Supine to sit: Min assist (A for legs)          Transfers Overall transfer level: Needs assistance Equipment used: 2 person hand held assist Transfers: Sit to/from Stand Sit to Stand: Min assist, +2 safety/equipment           General transfer comment: Mod A+2 lateral side steps up towards the [facility] with VCs to keep stepping     Balance Overall balance assessment: Needs assistance Sitting-balance support: No upper extremity supported, Feet supported Sitting balance-Leahy Scale: Fair     Standing balance support: Bilateral upper extremity supported Standing balance-Leahy Scale: Poor                             ADL either performed or assessed with clinical judgement   ADL Overall ADL's : Needs assistance/impaired Eating/Feeding: NPO   Grooming: Wash/dry face;Min guard;Sitting Grooming Details (indicate cue type and reason): EOB                               General ADL Comments: Pt min A +2 sit<>stand and Mod A +2 lateral side step up towards the [facility]    Extremity/Trunk Assessment Upper Extremity Assessment Upper Extremity Assessment: Generalized weakness            Vision Baseline Vision/History: 0 No visual deficits  Patient Visual Report: No change from baseline            Cognition Arousal/Alertness: Awake/alert Behavior During Therapy: Flat affect Overall Cognitive Status: Difficult to assess                                 General Comments: no anxiousness today, followed all commands                   Pertinent Vitals/ Pain       Pain Assessment Pain Assessment: No/denies pain Faces Pain Scale: No hurt         Frequency  Min 2X/week         Progress Toward Goals  OT Goals(current goals can now be found in the care plan section)  Progress towards OT goals: Progressing toward goals  Acute Rehab OT Goals Patient Stated Goal: to get a mouth swab and to loosen trach collar strap that was around her neck OT Goal Formulation: With patient Time For Goal Achievement: 07/09/22 Potential to Achieve Goals: [place] Discharge plan remains appropriate       AM-PAC OT "6 Clicks" Daily Activity     Outcome Measure   Help from another person eating meals?: Total Help from another person taking care of personal grooming?: A Lot Help from another person toileting, which includes using toliet, bedpan, or urinal?: Total Help from another person bathing (including washing, rinsing, drying)?: Total Help from another person to put on and taking off regular upper body clothing?: Total Help from another person to put on and taking off regular lower body clothing?: Total 6 Click Score: 7    End of Session Equipment Utilized During Treatment: Oxygen (vent, FiO2 40%)  OT Visit Diagnosis: Muscle weakness (generalized) (M62.81);Other abnormalities of gait and mobility (R26.89);Unsteadiness on feet (R26.81)   Activity Tolerance Patient tolerated treatment well   Patient Left with call bell/phone within reach;with bed alarm set;in bed   Nurse Communication Mobility status (bed linens changed)        Time: 9476-5465 OT Time Calculation (min): 17 min  Charges: OT General Charges $OT Visit: 1 Visit OT Evaluation $OT Eval Moderate Complexity: 1 [place] Office [phone]    Almon Register 06/28/2022, 1:52 PM

## 2022-06-28 NOTE — Progress Notes (Signed)
NAME:  Abigail Castillo, MRN:  [id number], DOB:  05/12/1951, LOS: 66 ADMISSION DATE:  05/19/2022, CONSULTATION DATE:  05/20/22 REFERRING MD:  Roger Shelter CHIEF COMPLAINT:  Dyspnea   History of Present Illness:  Abigail Castillo is a 72 y.o. female who has a PMH as below including but not limited to COPD on chronic 2-3L O2 and '10mg'$  Prednisone, OSA on nocturnal BiPAP, and who is followed by Dr. Lamonte Sakai in our office. She had recent admission 11/15 through 12/18 for AECOPD and was then discharged to [facility]  She was recently seen as an outpatient by Dr. Lamonte Sakai on 12/22 and was prescribed a slow prednisone taper with instructions to drop to '10mg'$  and continue until her next follow up appointment.  She presented back to [facility] ED 12/25 with dyspnea and hypoxia down to 65%. She was found to have hypoxic and hypercapnic respiratory failure and her RSV swab returned positive. She was placed on BiPAP and was admitted by [facility]  On 12/26, PCCM was consulted for assistance with BiPAP and COPD management. Failed to wean BiPAP and ultimately required intubation 1/2. Tracheostomy placed 1/4. Throughout stay tolerated TC for periods, however requiring periods of vent. LTAC denied. 1/23 PEG placed. 1/29 tolerating trach collar.   2/2 hypoxic and SOB on 15L Trach Collar. Transferred to ICU. Placed on Vent. Trach exchanged.   Pertinent  Medical History:  has History of tobacco abuse; Depression; COPD (chronic obstructive pulmonary disease) ([place]); Osteoporosis; HOARSENESS, CHRONIC; HYPERTRIGLYCERIDEMIA; Memory change; B12 deficiency; Personal history of colonic polyps; Encounter for routine gynecological examination; Mild hyperlipidemia; Vitamin D deficiency; Esophageal stricture; GERD (gastroesophageal reflux disease); Schizoaffective disorder ([place]); Allergic rhinitis; Chronic respiratory failure ([place]); Acute on chronic respiratory failure with hypoxia ([place]); Elevated blood pressure reading without diagnosis of hypertension;  Hepatic steatosis; Ductal carcinoma in situ (DCIS) of left breast; On home oxygen therapy; Hyponatremia; Bronchomalacia; Localized edema; COPD exacerbation ([place]); Tracheobronchomalacia; Acute on chronic respiratory failure with hypoxia and hypercapnia (HCC); COPD with acute exacerbation ([place]); RSV (respiratory syncytial virus pneumonia); Essential hypertension; Malnutrition of moderate degree; Acute respiratory failure with hypercapnia ([place]); Pressure injury of skin; and Tracheostomy in place [facility]) on their problem list.  Significant Hospital Events: Including procedures, antibiotic start and stop dates in addition to other pertinent events   12/25 admit. 12/26 PCCM consult. 1/2 Failed BiPAP wean, Intubated 1/3 CT Head negative was completed for observed pupillary change 1/4 tracheostomy 1/8 CT head for anisocoria; neg,  1/11 continuing progressive trach collar trials 1/12 BID trach collar trials, appears comfortable  1/14 failed PSV 1/15 pulse prednisone at 40 mg continues to fail SBT trials LTAC denied awaiting vent SNF 1/16 tolerated 1 hr atc. ASA placed on hold at request of IR for PEG.  1/23 PEG placement 1/25 placed back on full support for fatigue/ increased WOB after ATC x 25hrs, softer Bps 1/27 was able to tolerate trach collar for better part of prior day.  Rested at nighttime.  Stood with assistance 1/29 AM, indefinite trach collar  Interim History / Subjective:  Unfortunately more SOB, now on 15LPM TC, tiring out.  Objective:  Blood pressure 124/73, pulse (!) 112, temperature 98.9 F (37.2 C), temperature source Oral, resp. rate 20, height '5\' 2"'$  (1.575 m), weight 58.8 kg, SpO2 99 %.    Vent Mode: PRVC FiO2 (%):  [40 %-100 %] 40 % Set Rate:  [22 bmp] 22 bmp Vt Set:  [400 mL] 400 mL PEEP:  [5 cmH20-8 cmH20] 5 cmH20 Pressure Support:  [10  [place] Pressure:  [14 PYK99-83 cmH20] 17 cmH20   Intake/Output Summary (Last 24 hours) at 06/28/2022 0837 Last data  filed at 06/28/2022 3825 Gross per 24 hour  Intake 913.97 ml  Output 1420 ml  Net -506.03 ml   Filed Weights   06/25/22 0410 06/26/22 0500 06/27/22 0500  Weight: 58.8 kg 58.9 kg 58.8 kg   Physical Examination: General: Chronically ill appearing adult female on vent  Resp: Coarse breath sounds, diminished to bases  ABD:  Soft, non-tender, PEG in place  Neuro Alert, follows commands  Ext: -edema   Ancillary tests personally reviewed      Assessment & Plan:   Acute on chronic respiratory failure with hypoxia/hypercarbia s/p trach Acute COPD exacerbation in the setting of RSV pneumonia > Resolved  Plan - TC as tolerated  - Vent Support as needed (Currently on PS 10/5)  - trach care - Brovana, Yupelri, Pulmicort plus xopenex as needed   HTN - metoprolol to 25 BID  - tele monitoring   Chronic anxiety & depression - cont Klonopin, Zyprexa, BuSpar   Hypokalemia, resolved - Trend BMP    GERD - pepcid    Breast cancer - cont anastrozole   Acute/subacute L cerebellar and L frontal stroke - cont aspirin, statin   Diabetes type 2, and hyperglycemia - cont SSI prn w/ semglee 8units daily  - goal BG <180   Moderate malnutrition - TF per PEG - banatrol for diarrhea, adding prn imodium     Time Spent: 55 minutes  Hayden Pedro, AGACNP-BC [facility]  PCCM Pgr: [phone]

## 2022-06-28 NOTE — Progress Notes (Signed)
Palliative Care Progress Note, Assessment & Plan   Patient Name: Abigail Castillo       Date: 06/28/2022 DOB: 12-07-1950  Age: 72 y.o. MRN#: [id number] Attending Physician: Abigail Nash, DO Primary Care Physician: Abigail Alar, NP Admit Date: 05/19/2022  Subjective: Patient is lying in bed in no apparent distress.  She acknowledges my presence and is able to make her wishes known.  Trach collar in place and connected to ventilator.  No family or friends present at bedside.  HPI: 72 y.o. female  with past medical history of COPS, OSA (bipap), HTN, anxiety/depression, GERD, breast CA, DM2, and dysphagia admitted on 05/19/2022 with dyspnea and hypoxia. Pt found to be RSV+.    Pt is being treated for acute COPD exacerbation and RSV. Pt currently has trach and PEG.Neurology is following for concern of possible subacute infarct.    PMT was consulted to clarify and assist with goals of care.    Summary of counseling/coordination of care: After reviewing the patient's chart and assessing the patient at bedside, I attempted to speak about plan of care and goals of care with patient.  She is unable to come off of it and have Passy-Muir valve placed.  We had discussions limited to yes/no questions.  She drifted off to sleep several times during our interaction.  She confirmed that she would like for me to speak with her daughter in regards to goals of care.  I counseled with dayshift RN who confirms no issues overnight and that patient appears to be more comfortable today.  After assessing the patient, spoke with patient's daughter Abigail Castillo over the phone.  Brief medical update discussed.  Candace is hopeful that with trach exchange patient will not have such difficulty with oxygen requirements and be able to  have trach collar trial.  I again outlined values and goals important to the patient.  Abigail Castillo remains hopeful that patient will continue to get better.  She is in agreement with patient's previous wishes to be a full code and is accepting of all offered, available, and appropriate interventions to sustain the patient's life.    We discussed tenuous nature of patient's trach and respiratory status given her extensive COPD.  Abigail Castillo shares she has a realistic understanding that patient may not progress or do well.  However, she remains hopeful and positive.  Full code and full scope remain.  PMT will remain available to patient and family throughout her hospitalization.  As discussed with Abigail Castillo, PMT will monitor her peripherally. Candace has PMT contact information and was advised to call with any future palliative needs.  Physical Exam Vitals reviewed.  Constitutional:      General: She is not in acute distress.    Appearance: She is not ill-appearing.  HENT:     Mouth/Throat:     Mouth: Mucous membranes are moist.  Eyes:     Pupils: Pupils are equal, round, and reactive to light.  Cardiovascular:     Rate and Rhythm: Normal rate.     Pulses: Normal pulses.  Pulmonary:     Effort: Pulmonary effort is normal.     Comments: Trach collar/vent Musculoskeletal:     Comments: Generalized weakness  Skin:    General: Skin is warm and dry.     Coloration: Skin is pale.  Neurological:     Mental Status: She is alert.  Psychiatric:        Mood and Affect: Mood normal.        Behavior: Behavior normal.             Total Time 35 minutes   Abigail Castillo L. Ilsa Iha, FNP-BC Palliative Medicine Team Team Phone # [phone]

## 2022-06-29 DIAGNOSIS — J9622 Acute and chronic respiratory failure with hypercapnia: Secondary | ICD-10-CM | POA: Diagnosis not present

## 2022-06-29 DIAGNOSIS — J9621 Acute and chronic respiratory failure with hypoxia: Secondary | ICD-10-CM | POA: Diagnosis not present

## 2022-06-29 LAB — CBC WITH DIFFERENTIAL/PLATELET
Abs Immature Granulocytes: 0.04 10*3/uL (ref 0.00–0.07)
Basophils Absolute: 0 10*3/uL (ref 0.0–0.1)
Basophils Relative: 0 %
Eosinophils Absolute: 0.1 10*3/uL (ref 0.0–0.5)
Eosinophils Relative: 1 %
HCT: 32.4 % — ABNORMAL LOW (ref 36.0–46.0)
Hemoglobin: 10 g/dL — ABNORMAL LOW (ref 12.0–15.0)
Immature Granulocytes: 1 %
Lymphocytes Relative: 14 %
Lymphs Abs: 1.1 10*3/uL (ref 0.7–4.0)
MCH: 31.4 pg (ref 26.0–34.0)
MCHC: 30.9 g/dL (ref 30.0–36.0)
MCV: 101.9 fL — ABNORMAL HIGH (ref 80.0–100.0)
Monocytes Absolute: 0.6 10*3/uL (ref 0.1–1.0)
Monocytes Relative: 8 %
Neutro Abs: 5.9 10*3/uL (ref 1.7–7.7)
Neutrophils Relative %: 76 %
Platelets: 149 10*3/uL — ABNORMAL LOW (ref 150–400)
RBC: 3.18 MIL/uL — ABNORMAL LOW (ref 3.87–5.11)
RDW: 16.4 % — ABNORMAL HIGH (ref 11.5–15.5)
WBC: 7.7 10*3/uL (ref 4.0–10.5)
nRBC: 0 % (ref 0.0–0.2)

## 2022-06-29 LAB — GLUCOSE, CAPILLARY
Glucose-Capillary: 85 mg/dL (ref 70–99)
Glucose-Capillary: 160 mg/dL — ABNORMAL HIGH (ref 70–99)
Glucose-Capillary: 209 mg/dL — ABNORMAL HIGH (ref 70–99)
Glucose-Capillary: 126 mg/dL — ABNORMAL HIGH (ref 70–99)
Glucose-Capillary: 215 mg/dL — ABNORMAL HIGH (ref 70–99)
Glucose-Capillary: 168 mg/dL — ABNORMAL HIGH (ref 70–99)

## 2022-06-29 LAB — BASIC METABOLIC PANEL
Anion gap: 8 (ref 5–15)
BUN: 28 mg/dL — ABNORMAL HIGH (ref 8–23)
CO2: 38 mmol/L — ABNORMAL HIGH (ref 22–32)
Calcium: 9.4 mg/dL (ref 8.9–10.3)
Chloride: 98 mmol/L (ref 98–111)
Creatinine, Ser: 0.57 mg/dL (ref 0.44–1.00)
GFR, Estimated: >60 mL/min (ref >60)
Glucose, Bld: 207 mg/dL — ABNORMAL HIGH (ref 70–99)
Potassium: 3.7 mmol/L (ref 3.5–5.1)
Sodium: 144 mmol/L (ref 135–145)

## 2022-06-29 LAB — BRAIN NATRIURETIC PEPTIDE: B Natriuretic Peptide: 21.5 pg/mL (ref 0.0–100.0)

## 2022-06-29 LAB — MAGNESIUM: Magnesium: 1.9 mg/dL (ref 1.7–2.4)

## 2022-06-29 MED ORDER — MAGNESIUM SULFATE 2 GM/50ML IV SOLN
2.0000 g | Freq: Once | INTRAVENOUS | Status: AC
  Administered 2022-06-29: 2 g via INTRAVENOUS
  Filled 2022-06-29: qty 50

## 2022-06-29 NOTE — Progress Notes (Signed)
NAME:  Abigail Castillo, MRN:  [id number], DOB:  [birth year], LOS: 56 ADMISSION DATE:  05/19/2022, CONSULTATION DATE:  05/20/22 REFERRING MD:  Roger Shelter CHIEF COMPLAINT:  Dyspnea   History of Present Illness:  Abigail Castillo is a 72 y.o. female who has a PMH as below including but not limited to COPD on chronic 2-3L O2 and '10mg'$  Prednisone, OSA on nocturnal BiPAP, and who is followed by Dr. Lamonte Sakai in our office. She had recent admission 11/15 through 12/18 for AECOPD and was then discharged to [facility].  She was recently seen as an outpatient by Dr. Lamonte Sakai on 12/22 and was prescribed a slow prednisone taper with instructions to drop to '10mg'$  and continue until her next follow up appointment.  She presented back to [facility] ED 12/25 with dyspnea and hypoxia down to 65%. She was found to have hypoxic and hypercapnic respiratory failure and her RSV swab returned positive. She was placed on BiPAP and was admitted by [facility].  On 12/26, PCCM was consulted for assistance with BiPAP and COPD management. Failed to wean BiPAP and ultimately required intubation 1/2. Tracheostomy placed 1/4. Throughout stay tolerated TC for periods, however requiring periods of vent. LTAC denied. 1/23 PEG placed. 1/29 tolerating trach collar.   2/2 hypoxic and SOB on 15L Trach Collar. Transferred to ICU. Placed on Vent. Trach exchanged.   Pertinent  Medical History:  has History of tobacco abuse; Depression; COPD (chronic obstructive pulmonary disease) ([place]); Osteoporosis; HOARSENESS, CHRONIC; HYPERTRIGLYCERIDEMIA; Memory change; B12 deficiency; Personal history of colonic polyps; Encounter for routine gynecological examination; Mild hyperlipidemia; Vitamin D deficiency; Esophageal stricture; GERD (gastroesophageal reflux disease); Schizoaffective disorder ([place]); Allergic rhinitis; Chronic respiratory failure ([place]); Acute on chronic respiratory failure with hypoxia ([place]); Elevated blood pressure reading without diagnosis of hypertension;  Hepatic steatosis; Ductal carcinoma in situ (DCIS) of left breast; On home oxygen therapy; Hyponatremia; Bronchomalacia; Localized edema; COPD exacerbation ([place]); Tracheobronchomalacia; Acute on chronic respiratory failure with hypoxia and hypercapnia (HCC); COPD with acute exacerbation ([place]); RSV (respiratory syncytial virus pneumonia); Essential hypertension; Malnutrition of moderate degree; Acute respiratory failure with hypercapnia ([place]); Pressure injury of skin; and Tracheostomy in place [facility]) on their problem list.  Significant Hospital Events: Including procedures, antibiotic start and stop dates in addition to other pertinent events   12/25 admit. 12/26 PCCM consult. 1/2 Failed BiPAP wean, Intubated 1/3 CT Head negative was completed for observed pupillary change 1/4 tracheostomy 1/8 CT head for anisocoria; neg,  1/11 continuing progressive trach collar trials 1/12 BID trach collar trials, appears comfortable  1/14 failed PSV 1/15 pulse prednisone at 40 mg continues to fail SBT trials LTAC denied awaiting vent SNF 1/16 tolerated 1 hr atc. ASA placed on hold at request of IR for PEG.  1/23 PEG placement 1/25 placed back on full support for fatigue/ increased WOB after ATC x 25hrs, softer Bps 1/27 was able to tolerate trach collar for better part of prior day.  Rested at nighttime.  Stood with assistance 1/29 AM, indefinite trach collar 2/2 Back to ICU requiring vent. Trach Exchanged due to cuff leak.   Interim History / Subjective:  Overnight placed on PRVC. During the day 2/3 on PS 10/5  Objective:  Blood pressure 100/66, pulse 94, temperature 98.3 F (36.8 C), temperature source Oral, resp. rate (!) 22, height '5\' 2"'$  (1.575 m), weight 59.6 kg, SpO2 100 %.    Vent Mode: PRVC FiO2 (%):  [40 %-50 %] 50 % Set Rate:  [22 bmp] 22 bmp Vt Set:  [  400 mL] 400 mL PEEP:  [5 cmH20-8 cmH20] 5 cmH20 Pressure Support:  [10 cmH20] 10 cmH20 Plateau Pressure:  [14 cmH20-21 cmH20] 14 cmH20    Intake/Output Summary (Last 24 hours) at 06/29/2022 6659 Last data filed at 06/29/2022 0548 Gross per 24 hour  Intake 1188 ml  Output 700 ml  Net 488 ml   Filed Weights   06/26/22 0500 06/27/22 0500 06/29/22 0444  Weight: 58.9 kg 58.8 kg 59.6 kg   Physical Examination: General: Chronically ill appearing adult female on vent  Resp: Coarse breath sounds, diminished to bases, vent assisted breaths  ABD:  Soft, non-tender, PEG in place >> clean/dry  Neuro Alert, follows commands  Ext: -edema   Ancillary tests personally reviewed      Assessment & Plan:   Acute on chronic respiratory failure with hypoxia/hypercarbia s/p trach Acute COPD exacerbation in the setting of RSV pneumonia > Resolved  Plan - TC as tolerated  - Vent Support as needed >> PRVC currently. Will trial PS this AM and goal of TC  - trach care - Brovana, Yupelri, Pulmicort plus xopenex as needed - PT/OT/ST    HTN - metoprolol to 25 BID  - tele monitoring   Chronic anxiety & depression - cont Klonopin, Zyprexa, BuSpar   Hypokalemia, resolved - Trend BMP    GERD - pepcid    Breast cancer - cont anastrozole   Acute/subacute L cerebellar and L frontal stroke - cont aspirin, statin   Diabetes type 2, and hyperglycemia - cont SSI prn w/ semglee 8 units daily  - goal BG <180   Moderate malnutrition - TF per PEG - banatrol for diarrhea, adding prn imodium     Time Spent: 55 minutes  Hayden Pedro, AGACNP-BC [facility]  PCCM Pgr: [phone]

## 2022-06-29 NOTE — Progress Notes (Signed)
   Palliative Medicine Inpatient Follow Up Note   Chart review complete.  Reached out to RN and CCM NP this afternoon.   On Trach collar and doing well thus far.  No additional PMT needs at this time.   No Charge ______________________________________________________________________________________ [place] Team Team Cell Phone: [phone] Please utilize secure chat with additional questions, if there is no response within 30 minutes please call the above phone number  Palliative Medicine Team providers are available by phone from 7am to 7pm daily and can be reached through the team cell phone.  Should this patient require assistance outside of these hours, please call the patient's attending physician.

## 2022-06-30 DIAGNOSIS — J9621 Acute and chronic respiratory failure with hypoxia: Secondary | ICD-10-CM | POA: Diagnosis not present

## 2022-06-30 DIAGNOSIS — J9622 Acute and chronic respiratory failure with hypercapnia: Secondary | ICD-10-CM | POA: Diagnosis not present

## 2022-06-30 LAB — GLUCOSE, CAPILLARY
Glucose-Capillary: 107 mg/dL — ABNORMAL HIGH (ref 70–99)
Glucose-Capillary: 141 mg/dL — ABNORMAL HIGH (ref 70–99)
Glucose-Capillary: 149 mg/dL — ABNORMAL HIGH (ref 70–99)
Glucose-Capillary: 194 mg/dL — ABNORMAL HIGH (ref 70–99)
Glucose-Capillary: 215 mg/dL — ABNORMAL HIGH (ref 70–99)
Glucose-Capillary: 95 mg/dL (ref 70–99)

## 2022-06-30 LAB — POCT I-STAT 7, (LYTES, BLD GAS, ICA,H+H)
Acid-Base Excess: 17 mmol/L — ABNORMAL HIGH (ref 0.0–2.0)
Acid-Base Excess: 16 mmol/L — ABNORMAL HIGH (ref 0.0–2.0)
Bicarbonate: 44.7 mmol/L — ABNORMAL HIGH (ref 20.0–28.0)
Bicarbonate: 41.5 mmol/L — ABNORMAL HIGH (ref 20.0–28.0)
Calcium, Ion: 1.33 mmol/L (ref 1.15–1.40)
Calcium, Ion: 1.27 mmol/L (ref 1.15–1.40)
HCT: 29 % — ABNORMAL LOW (ref 36.0–46.0)
HCT: 27 % — ABNORMAL LOW (ref 36.0–46.0)
Hemoglobin: 9.9 g/dL — ABNORMAL LOW (ref 12.0–15.0)
Hemoglobin: 9.2 g/dL — ABNORMAL LOW (ref 12.0–15.0)
O2 Saturation: 94 %
O2 Saturation: 99 %
Patient temperature: 97.7
Patient temperature: 98.6
Potassium: 4.1 mmol/L (ref 3.5–5.1)
Potassium: 4.1 mmol/L (ref 3.5–5.1)
Sodium: 147 mmol/L — ABNORMAL HIGH (ref 135–145)
Sodium: 146 mmol/L — ABNORMAL HIGH (ref 135–145)
TCO2: 47 mmol/L — ABNORMAL HIGH (ref 22–32)
TCO2: 43 mmol/L — ABNORMAL HIGH (ref 22–32)
pCO2 arterial: 69.8 mmHg (ref 32–48)
pCO2 arterial: 53.2 mmHg — ABNORMAL HIGH (ref 32–48)
pH, Arterial: 7.412 (ref 7.35–7.45)
pH, Arterial: 7.5 — ABNORMAL HIGH (ref 7.35–7.45)
pO2, Arterial: 72 mmHg — ABNORMAL LOW (ref 83–108)
pO2, Arterial: 123 mmHg — ABNORMAL HIGH (ref 83–108)

## 2022-06-30 LAB — BASIC METABOLIC PANEL
Anion gap: 10 (ref 5–15)
BUN: 27 mg/dL — ABNORMAL HIGH (ref 8–23)
CO2: 38 mmol/L — ABNORMAL HIGH (ref 22–32)
Calcium: 9.6 mg/dL (ref 8.9–10.3)
Chloride: 101 mmol/L (ref 98–111)
Creatinine, Ser: 0.47 mg/dL (ref 0.44–1.00)
GFR, Estimated: >60 mL/min (ref >60)
Glucose, Bld: 106 mg/dL — ABNORMAL HIGH (ref 70–99)
Potassium: 4.2 mmol/L (ref 3.5–5.1)
Sodium: 149 mmol/L — ABNORMAL HIGH (ref 135–145)

## 2022-06-30 LAB — CBC WITH DIFFERENTIAL/PLATELET
Abs Immature Granulocytes: 0.06 10*3/uL (ref 0.00–0.07)
Basophils Absolute: 0 10*3/uL (ref 0.0–0.1)
Basophils Relative: 0 %
Eosinophils Absolute: 0.1 10*3/uL (ref 0.0–0.5)
Eosinophils Relative: 1 %
HCT: 33.4 % — ABNORMAL LOW (ref 36.0–46.0)
Hemoglobin: 10 g/dL — ABNORMAL LOW (ref 12.0–15.0)
Immature Granulocytes: 1 %
Lymphocytes Relative: 13 %
Lymphs Abs: 1.2 10*3/uL (ref 0.7–4.0)
MCH: 30.8 pg (ref 26.0–34.0)
MCHC: 29.9 g/dL — ABNORMAL LOW (ref 30.0–36.0)
MCV: 102.8 fL — ABNORMAL HIGH (ref 80.0–100.0)
Monocytes Absolute: 0.9 10*3/uL (ref 0.1–1.0)
Monocytes Relative: 9 %
Neutro Abs: 7.2 10*3/uL (ref 1.7–7.7)
Neutrophils Relative %: 76 %
Platelets: 154 10*3/uL (ref 150–400)
RBC: 3.25 MIL/uL — ABNORMAL LOW (ref 3.87–5.11)
RDW: 16.4 % — ABNORMAL HIGH (ref 11.5–15.5)
WBC: 9.4 10*3/uL (ref 4.0–10.5)
nRBC: 0 % (ref 0.0–0.2)

## 2022-06-30 LAB — PHOSPHORUS: Phosphorus: 4.5 mg/dL (ref 2.5–4.6)

## 2022-06-30 LAB — MAGNESIUM: Magnesium: 2.1 mg/dL (ref 1.7–2.4)

## 2022-06-30 MED ORDER — FREE WATER
100.0000 mL | Freq: Four times a day (QID) | Status: DC
  Administered 2022-06-30 (×3): 100 mL

## 2022-06-30 NOTE — Progress Notes (Signed)
Physical Therapy Treatment Patient Details Name: Abigail Castillo MRN: [id number] DOB: 1950/08/25 Today's Date: 06/30/2022   History of Present Illness [name] is a 72 y.o. F recently admitted for COPD flare, discharged to SNF who returned due to SpO2 65%. Found to be  RSV+, pCO2 80, started on BiPAP.   Head CT showing possible subacute infarct of the medial left cerebellar hemisphere. Failed bipap wean on 1/2, intubated. Tracheostomy 1/4.Transferred back to ICU and placed back on vent due to respiratory issues. Pt with COPD and chronic respiratory failure on 2-3L home O2, depression/anxiety and hx BrCA    PT Comments    Pt admitted with above diagnosis. Pt was only able to sit EOB for 5 min before fatigue needing to lie back down. Pt with soft BP as well as desaturation limiting rx as well.  Notfied nurse of pts' lethargy, decr ability to participate and VS and nurse called MD to get ABGs for pt to check status.  Will continue to progress pt as able.  Pt currently with functional limitations due to balance and endurance deficits. Pt will benefit from skilled PT to increase their independence and safety with mobility to allow discharge to the venue listed below.      Recommendations for follow up therapy are one component of a multi-disciplinary discharge planning process, led by the attending physician.  Recommendations may be updated based on patient status, additional functional criteria and insurance authorization.  Follow Up Recommendations  Skilled nursing-short term rehab (<3 hours/day) Can patient physically be transported by private vehicle: No   Assistance Recommended at Discharge Frequent or constant Supervision/Assistance  Patient can return home with the following A lot of help with bathing/dressing/bathroom;A little help with walking and/or transfers;Assist for transportation   Equipment Recommendations  Hospital bed;Wheelchair (measurements PT);Wheelchair cushion (measurements  PT);Other (comment)    Recommendations for Other Services       Precautions / Restrictions Precautions Precautions: Fall Precaution Comments: trach Restrictions Weight Bearing Restrictions: No     Mobility  Bed Mobility Overal bed mobility: Needs Assistance Bed Mobility: Supine to Sit, Sit to Supine Rolling: Supervision Sidelying to sit: Min assist, +2 for safety/equipment, Mod assist (A for trunk) Supine to sit: Min assist, +2 for safety/equipment (A for legs) Sit to supine: Mod assist   General bed mobility comments: Assist to elevate trunk and bring hips to EOB    Transfers Overall transfer level: Needs assistance Equipment used: Rolling walker (2 wheels) Transfers: Sit to/from Stand Sit to Stand: Min assist, +2 safety/equipment, +2 physical assistance, From elevated surface           General transfer comment: Pt stood to her feet for a brief second and immediately sat back down as pt fatigued. Pt not able to take steps to [facility].  Pt reports fatigue therefore assisted back to bed and notified nurseing.    Ambulation/Gait                   Stairs             Wheelchair Mobility    Modified Rankin (Stroke Patients Only)       Balance Overall balance assessment: Needs assistance Sitting-balance support: No upper extremity supported, Feet supported, Bilateral upper extremity supported Sitting balance-Leahy Scale: Poor Sitting balance - Comments: Tends to rely on at least L UE support on bed to sit EOB, min guard for safety   Standing balance support: Bilateral upper extremity supported Standing balance-Leahy Scale: Poor Standing  balance comment: walker and min to mod for static standing and pt couldnt sustain over a second today with pt not getting all the way to standing.                            Cognition Arousal/Alertness: Lethargic Behavior During Therapy: Flat affect Overall Cognitive Status: Difficult to assess                                  General Comments: Pt lethargic.  nurse made aware and called for ABGs as pt is usually not as lethargic.  Sats also low 90's        Exercises General Exercises - Lower Extremity Ankle Circles/Pumps: AAROM, Both, Supine, Seated, 15 reps Heel Slides: AAROM, Both, 10 reps, Supine    General Comments General comments (skin integrity, edema, etc.): 40% trach collar with sats 91% and dropped to 89% with activity sitting EOB; 97 bpm, BP initially 91/62.  Sitting 94/62.  Once laid back down, 113/74.  On departure, 109/67.  Nurse aware of fluctuating BP and desaturation.      Pertinent Vitals/Pain Pain Assessment Pain Assessment: No/denies pain Faces Pain Scale: No hurt    Home Living                          Prior Function            PT Goals (current goals can now be found in the care plan section) Acute Rehab PT Goals Patient Stated Goal: agreeable to session Progress towards PT goals: Not progressing toward goals - comment (Lethargy and fatigue limited rx.)    Frequency    Min 2X/week      PT Plan Current plan remains appropriate    Co-evaluation              AM-PAC PT "6 Clicks" Mobility   Outcome Measure  Help needed turning from your back to your side while in a flat bed without using bedrails?: A Little Help needed moving from lying on your back to sitting on the side of a flat bed without using bedrails?: A Lot Help needed moving to and from a bed to a chair (including a wheelchair)?: Total Help needed standing up from a chair using your arms (e.g., wheelchair or bedside chair)?: Total Help needed to walk in hospital room?: Total Help needed climbing 3-5 steps with a railing? : Total 6 Click Score: 9    End of Session Equipment Utilized During Treatment: Oxygen Activity Tolerance: Patient limited by fatigue (Limited by dyspnea, low BP and desaturation) Patient left: with call bell/phone within reach;in  bed;with bed alarm set Nurse Communication: Mobility status PT Visit Diagnosis: Difficulty in walking, not elsewhere classified (R26.2);Unsteadiness on feet (R26.81);Other abnormalities of gait and mobility (R26.89);Muscle weakness (generalized) (M62.81)     Time: 8119-1478 PT Time Calculation (min) (ACUTE ONLY): 23 min  Charges:  $Therapeutic Exercise: 8-22 mins $Therapeutic Activity: 8-22 mins                     [facility] M,PT Acute Rehab Services [phone]    Alvira Philips 06/30/2022, 11:16 AM

## 2022-06-30 NOTE — Progress Notes (Signed)
  Progress Note   Patient: Abigail Castillo QZR:[id number] DOB: 1950/08/26 DOA: 05/19/2022     42 DOS: the patient was seen and examined on 06/30/2022   Brief hospital course:  Assessment and Plan: * Acute on chronic respiratory failure with hypoxia and hypercapnia (HCC) - Pt is back on the ventilator this morning (06/30/2022) - Brovana 15 mcg bid  - Pulmicort 0.5 mg bid  - Xopenex q3 hr PRN  - Claritin 10 mg daily  - Yupelri 175 mcg daily  - Versed 1 mg q2 hr PRN   RSV (respiratory syncytial virus pneumonia) - Management as above  COPD with acute exacerbation ([place]) - Management as above  Malnutrition of moderate degree - Osmolite 1.5 cal - PROsource 60 mL daily  - Banatrol TF 60 mL tid  - MVI daily   Essential hypertension - Lopressor 25 mg bid   Hyponatremia - Monitor   Ductal carcinoma in situ (DCIS) of left breast - Arimidex 1 mg daily    Depression - Buspar 5 mg tid  - Olanzapine 10 mg daily  - Clonazepam 1 mg bid PRN  and 1 mg bedtime   L frontal CVA - ASA 81 mg daily  - Lipitor 40 mg daily   Prediabetes - A1c 6.1  - Novolog SS q4 hr - Semglee 8 units sq daily   DVT prophylaxis: Lovenox 40 mg sq daily  GI prophylaxis: Pepcid 20 mg daily      Subjective: Pt seen and examined at the bedside. Nurse advised pt was becoming tachypneic and had an increased work of breathing. ABG completed and pt was placed back on the ventilator this morning.   Physical Exam: Vitals:   06/30/22 1000 06/30/22 1100 06/30/22 1101 06/30/22 1125  BP: (!) 93/55 103/70    Pulse: 94 97 94   Resp: (!) 25 (!) 26 (!) 23   Temp:    98.2 F (36.8 C)  TempSrc:    Axillary  SpO2: 97% 93% 96%   Weight:      Height:       Physical Exam Constitutional:      Comments: Awake but drowsy   HENT:     Head: Normocephalic.     Mouth/Throat:     Mouth: Mucous membranes are moist.  Neck:     Comments: Trach in place Cardiovascular:     Rate and Rhythm: Normal rate and regular rhythm.   Pulmonary:     Breath sounds: Wheezing present.  Abdominal:     General: Abdomen is flat.     Palpations: Abdomen is soft.  Skin:    General: Skin is warm.  Neurological:     Comments: Awake but unable to ascertain full neurological status at this time   Psychiatric:     Comments: Unable to ascertain     Data Reviewed:   Disposition: Status is: Inpatient  Planned Discharge Destination: Barriers to discharge: Ventilator dependent     Time spent: 35 minutes  Author: Lucienne Minks , MD 06/30/2022 11:52 AM  For on call review [email or web address].

## 2022-06-30 NOTE — Progress Notes (Addendum)
NAME:  Abigail Castillo, MRN:  [id number], DOB:  June 08, 1950, LOS: 2 ADMISSION DATE:  05/19/2022, CONSULTATION DATE:  05/20/22 REFERRING MD:  Roger Shelter CHIEF COMPLAINT:  Dyspnea   History of Present Illness:  Abigail Castillo is a 72 y.o. female who has a PMH as below including but not limited to COPD on chronic 2-3L O2 and '10mg'$  Prednisone, OSA on nocturnal BiPAP, and who is followed by Dr. Lamonte Sakai in our office. She had recent admission 11/15 through 12/18 for AECOPD and was then discharged to [facility].  She was recently seen as an outpatient by Dr. Lamonte Sakai on 12/22 and was prescribed a slow prednisone taper with instructions to drop to '10mg'$  and continue until her next follow up appointment.  She presented back to [facility] ED 12/25 with dyspnea and hypoxia down to 65%. She was found to have hypoxic and hypercapnic respiratory failure and her RSV swab returned positive. She was placed on BiPAP and was admitted by [facility].  On 12/26, PCCM was consulted for assistance with BiPAP and COPD management. Failed to wean BiPAP and ultimately required intubation 1/2. Tracheostomy placed 1/4. Throughout stay tolerated TC for periods, however requiring periods of vent. LTAC denied. 1/23 PEG placed. 1/29 tolerating trach collar.   2/2 hypoxic and SOB on 15L Trach Collar. Transferred to ICU. Placed on Vent. Trach exchanged.   Pertinent  Medical History:  has History of tobacco abuse; Depression; COPD (chronic obstructive pulmonary disease) ([place]); Osteoporosis; HOARSENESS, CHRONIC; HYPERTRIGLYCERIDEMIA; Memory change; B12 deficiency; Personal history of colonic polyps; Encounter for routine gynecological examination; Mild hyperlipidemia; Vitamin D deficiency; Esophageal stricture; GERD (gastroesophageal reflux disease); Schizoaffective disorder ([place]); Allergic rhinitis; Chronic respiratory failure ([place]); Acute on chronic respiratory failure with hypoxia ([place]); Elevated blood pressure reading without diagnosis of hypertension;  Hepatic steatosis; Ductal carcinoma in situ (DCIS) of left breast; On home oxygen therapy; Hyponatremia; Bronchomalacia; Localized edema; COPD exacerbation ([place]); Tracheobronchomalacia; Acute on chronic respiratory failure with hypoxia and hypercapnia (HCC); COPD with acute exacerbation ([place]); RSV (respiratory syncytial virus pneumonia); Essential hypertension; Malnutrition of moderate degree; Acute respiratory failure with hypercapnia ([place]); Pressure injury of skin; and Tracheostomy in place [facility]) on their problem list.  Significant Hospital Events: Including procedures, antibiotic start and stop dates in addition to other pertinent events   12/25 admit. 12/26 PCCM consult. 1/2 Failed BiPAP wean, Intubated 1/3 CT Head negative was completed for observed pupillary change 1/4 tracheostomy 1/8 CT head for anisocoria; neg,  1/11 continuing progressive trach collar trials 1/12 BID trach collar trials, appears comfortable  1/14 failed PSV 1/15 pulse prednisone at 40 mg continues to fail SBT trials LTAC denied awaiting vent SNF 1/16 tolerated 1 hr atc. ASA placed on hold at request of IR for PEG.  1/23 PEG placement 1/25 placed back on full support for fatigue/ increased WOB after ATC x 25hrs, softer Bps 1/27 was able to tolerate trach collar for better part of prior day.  Rested at nighttime.  Stood with assistance 1/29 AM, indefinite trach collar 2/2 Back to ICU requiring vent. Trach Exchanged due to cuff leak.  06/30/2022 >> [facility], PCCM for vent management only  Interim History / Subjective:  Remains on 40% ATC. RR 23, Alert and appropriate. + 5 L, Tachy at 129 per tele  Objective:  Blood pressure 104/72, pulse (!) 115, temperature 98.1 F (36.7 C), temperature source Oral, resp. rate (!) 24, height '5\' 2"'$  (1.575 m), weight 59.5 kg, SpO2 98 %.    Vent Mode: Stand-by FiO2 (%):  [  40 %-50 %] 40 % Set Rate:  [22 bmp] 22 bmp Vt Set:  [400 mL] 400 mL PEEP:  [5 cmH20] 5 cmH20 Plateau  Pressure:  [15 cmH20] 15 cmH20   Intake/Output Summary (Last 24 hours) at 06/30/2022 0757 Last data filed at 06/30/2022 0600 Gross per 24 hour  Intake --  Output 1200 ml  Net -1200 ml   Filed Weights   06/27/22 0500 06/29/22 0444 06/30/22 0500  Weight: 58.8 kg 59.6 kg 59.5 kg   Physical Examination: General: Chronically ill appearing adult female on  ATC 40%, in NAD Resp: Bilateral chest excursion, rhonchi throughout, Thick clear to tan secretions  ABD:  Soft, non-tender, PEG in place >> clean/dry , BS + Neuro Alert, follows commands , MAE x 4, appropriate Ext: - 1+ to trace edema  Labs reviewed  Na 149. CO2 38. Glucose 106, K 4.2 BUN 27, Creatinine 0.47 HGB 10, WBC 9.4, platelets 154 K Mag 2.1, Phos 4.5 T Max 98.7  Ancillary tests personally reviewed      Assessment & Plan:   Acute on chronic respiratory failure with hypoxia/hypercarbia s/p trach Acute COPD exacerbation in the setting of RSV pneumonia > Resolved  Plan - ATC as tolerated  - trach care - Down Size trach as indicated by ability to continue to wean successfully - Maintain sats> 88% at all times - Continue Brovana, Yupelri, Pulmicort plus xopenex as needed - PT/OT/ST  - Intermittent CXR and diuresis  - Maintain Mag > 2  Hypernatremia Na 149 on 2/5 Plan Will add free water 100 cc Q 6 Trend BMET     Will leave in ICU as patient has copious secretions and is tachy today  Time Spent: 30 minutes  Magdalen Spatz, MSN, AGACNP-BC [place] on call pager [phone]  06/30/2022  7:58 AM

## 2022-07-01 DIAGNOSIS — J9621 Acute and chronic respiratory failure with hypoxia: Secondary | ICD-10-CM | POA: Diagnosis not present

## 2022-07-01 DIAGNOSIS — J9622 Acute and chronic respiratory failure with hypercapnia: Secondary | ICD-10-CM | POA: Diagnosis not present

## 2022-07-01 LAB — CBC WITH DIFFERENTIAL/PLATELET
Abs Immature Granulocytes: 0.05 10*3/uL (ref 0.00–0.07)
Basophils Absolute: 0 10*3/uL (ref 0.0–0.1)
Basophils Relative: 0 %
Eosinophils Absolute: 0.1 10*3/uL (ref 0.0–0.5)
Eosinophils Relative: 1 %
HCT: 32.3 % — ABNORMAL LOW (ref 36.0–46.0)
Hemoglobin: 9.4 g/dL — ABNORMAL LOW (ref 12.0–15.0)
Immature Granulocytes: 1 %
Lymphocytes Relative: 12 %
Lymphs Abs: 1 10*3/uL (ref 0.7–4.0)
MCH: 30.3 pg (ref 26.0–34.0)
MCHC: 29.1 g/dL — ABNORMAL LOW (ref 30.0–36.0)
MCV: 104.2 fL — ABNORMAL HIGH (ref 80.0–100.0)
Monocytes Absolute: 0.7 10*3/uL (ref 0.1–1.0)
Monocytes Relative: 8 %
Neutro Abs: 6.5 10*3/uL (ref 1.7–7.7)
Neutrophils Relative %: 78 %
Platelets: 142 10*3/uL — ABNORMAL LOW (ref 150–400)
RBC: 3.1 MIL/uL — ABNORMAL LOW (ref 3.87–5.11)
RDW: 16.6 % — ABNORMAL HIGH (ref 11.5–15.5)
WBC: 8.3 10*3/uL (ref 4.0–10.5)
nRBC: 0 % (ref 0.0–0.2)

## 2022-07-01 LAB — BASIC METABOLIC PANEL
Anion gap: 11 (ref 5–15)
BUN: 31 mg/dL — ABNORMAL HIGH (ref 8–23)
CO2: 35 mmol/L — ABNORMAL HIGH (ref 22–32)
Calcium: 9.2 mg/dL (ref 8.9–10.3)
Chloride: 100 mmol/L (ref 98–111)
Creatinine, Ser: 0.48 mg/dL (ref 0.44–1.00)
GFR, Estimated: >60 mL/min (ref >60)
Glucose, Bld: 159 mg/dL — ABNORMAL HIGH (ref 70–99)
Potassium: 3.9 mmol/L (ref 3.5–5.1)
Sodium: 146 mmol/L — ABNORMAL HIGH (ref 135–145)

## 2022-07-01 LAB — GLUCOSE, CAPILLARY
Glucose-Capillary: 156 mg/dL — ABNORMAL HIGH (ref 70–99)
Glucose-Capillary: 127 mg/dL — ABNORMAL HIGH (ref 70–99)
Glucose-Capillary: 216 mg/dL — ABNORMAL HIGH (ref 70–99)
Glucose-Capillary: 193 mg/dL — ABNORMAL HIGH (ref 70–99)
Glucose-Capillary: 197 mg/dL — ABNORMAL HIGH (ref 70–99)
Glucose-Capillary: 109 mg/dL — ABNORMAL HIGH (ref 70–99)

## 2022-07-01 LAB — PHOSPHORUS: Phosphorus: 3.1 mg/dL (ref 2.5–4.6)

## 2022-07-01 LAB — MAGNESIUM: Magnesium: 2 mg/dL (ref 1.7–2.4)

## 2022-07-01 MED ORDER — FREE WATER
200.0000 mL | Freq: Four times a day (QID) | Status: DC
  Administered 2022-07-01 – 2022-07-04 (×13): 200 mL

## 2022-07-01 MED ORDER — PREDNISONE 10 MG PO TABS
10.0000 mg | ORAL_TABLET | Freq: Every day | ORAL | Status: DC
  Administered 2022-07-02 – 2022-07-04 (×3): 10 mg
  Filled 2022-07-01 (×3): qty 1

## 2022-07-01 MED ORDER — PREDNISONE 10 MG PO TABS
10.0000 mg | ORAL_TABLET | Freq: Every day | ORAL | Status: DC
  Administered 2022-07-01: 10 mg via ORAL
  Filled 2022-07-01: qty 1

## 2022-07-01 MED ORDER — AZITHROMYCIN 500 MG PO TABS
250.0000 mg | ORAL_TABLET | Freq: Every day | ORAL | Status: DC
  Administered 2022-07-02 – 2022-07-04 (×3): 250 mg
  Filled 2022-07-01 (×3): qty 1

## 2022-07-01 MED ORDER — AZITHROMYCIN 500 MG PO TABS
250.0000 mg | ORAL_TABLET | Freq: Every day | ORAL | Status: DC
  Administered 2022-07-01: 250 mg via ORAL
  Filled 2022-07-01: qty 1

## 2022-07-01 MED ORDER — FUROSEMIDE 10 MG/ML IJ SOLN
40.0000 mg | Freq: Every day | INTRAMUSCULAR | Status: AC
  Administered 2022-07-01 – 2022-07-02 (×2): 40 mg via INTRAVENOUS
  Filled 2022-07-01 (×2): qty 4

## 2022-07-01 NOTE — TOC Progression Note (Addendum)
Transition of Care [facility]) - Initial/Assessment Note    Patient Details  Name: Abigail Castillo MRN: [id number] Date of Birth: 09/22/1950  [facility] CM/SW Contact:    Milinda Antis, LCSWA Phone Number: 07/01/2022, 10:18 AM  Clinical Narrative:                 LCSW received consult for LTACH placement and contacted the patient's daughter, Candice.  The family's preference is [facility].    LCSW contacted admissions at [facility]. The facility is reviewing the referral.  12:20- LCSW received a returned call from [place] with [facility].  The facility can extend a bed offer and will request insurance authorization.     15:40-  LCSW received a call from [facility], [phone], with [employer].  Ms. Gordy Councilman will be following the patient while she is inpatient and will assist with insurance authorization questions as needed.    TOC following.   Expected Discharge Plan: Skilled Nursing Facility Barriers to Discharge: Ship broker, Continued Medical Work up, SNF Pending bed offer   Patient Goals and CMS Choice   CMS Medicare.gov Compare Post Acute Care list provided to:: Patient Represenative (must comment) (Patients daughter Candice) Choice offered to / list presented to : Adult Children (Patients daughter Event organiser) [place] ownership interest in [facility].provided to:: Adult Children    Expected Discharge Plan and Services In-house Referral: Clinical Social Work   Post Acute Care Choice: Long Term Acute Care (LTAC) Living arrangements for the past 2 months:  (PTA came from [facility] short term before that was from home alone)                                      Prior Living Arrangements/Services Living arrangements for the past 2 months:  (PTA came from [facility] short term before that was from home alone)   Patient language and need for interpreter reviewed:: Yes Do you feel safe going back to the place where you live?: No    Vent/SNF  Need for Family Participation in Patient Care: Yes (Comment) Care giver support system in place?: Yes (comment)   Criminal Activity/Legal Involvement Pertinent to Current Situation/Hospitalization: No - Comment as needed  Activities of Daily Living Home Assistive Devices/Equipment: Eyeglasses, Oxygen ADL Screening (condition at time of admission) Patient's cognitive ability adequate to safely complete daily activities?: Yes Is the patient deaf or have difficulty hearing?: No Does the patient have difficulty seeing, even when wearing glasses/contacts?: No Does the patient have difficulty concentrating, remembering, or making decisions?: No Patient able to express need for assistance with ADLs?: Yes Does the patient have difficulty dressing or bathing?: No Independently performs ADLs?: Yes (appropriate for developmental age) Does the patient have difficulty walking or climbing stairs?: No Weakness of Legs: None Weakness of Arms/Hands: None  Permission Sought/Granted Permission sought to share information with : Case Manager, Family Supports, Chartered certified accountant granted to share information with : No  Share Information with NAME: Due to patients orientation CSW spoke with [employer]  Permission granted to share info w AGENCY: Vent/SNF  Permission granted to share info w Relationship: daughter  Permission granted to share info w Contact Information: Due to patients orientation CSW spoke with Candice,Candice [phone]  Emotional Assessment       Orientation: :  (intubated/trach) Alcohol / Substance Use: Not Applicable Psych Involvement: No (comment)  Admission diagnosis:  COPD exacerbation (  [place]) [J44.1] Acute respiratory failure with hypercapnia ([place]) [J96.02] COPD with acute exacerbation ([place]) [J44.1] Patient Active Problem List   Diagnosis Date Noted   Tracheostomy in place [facility]) 06/08/2022   Acute respiratory failure with hypercapnia ([place])  05/28/2022   Pressure injury of skin 05/28/2022   Malnutrition of moderate degree 05/20/2022   COPD with acute exacerbation ([place]) 05/19/2022   RSV (respiratory syncytial virus pneumonia) 05/19/2022   Essential hypertension 05/19/2022   Tracheobronchomalacia 04/27/2022   Acute on chronic respiratory failure with hypoxia and hypercapnia (HCC) 04/27/2022   Bronchomalacia 04/25/2022   Localized edema 04/25/2022   COPD exacerbation ([place]) 04/25/2022   Hyponatremia 04/10/2022   On home oxygen therapy 12/24/2021   Ductal carcinoma in situ (DCIS) of left breast 06/03/2021   Hepatic steatosis 05/13/2021   Elevated blood pressure reading without diagnosis of hypertension 09/14/2020   Acute on chronic respiratory failure with hypoxia ([place]) 07/05/2019   Chronic respiratory failure ([place]) 08/25/2017   Allergic rhinitis 06/16/2017   Schizoaffective disorder ([place]) 06/30/2016   GERD (gastroesophageal reflux disease) 11/02/2015   Esophageal stricture 03/14/2015   Mild hyperlipidemia 02/03/2014   Vitamin D deficiency 02/03/2014   Encounter for routine gynecological examination 06/14/2013   Personal history of colonic polyps 03/10/2012   B12 deficiency 11/11/2010   Memory change 11/04/2010   HYPERTRIGLYCERIDEMIA 06/14/2010   Depression 05/31/2009   History of tobacco abuse 03/02/2008   Osteoporosis 04/05/2007   COPD (chronic obstructive pulmonary disease) ([place]) 03/24/2007   HOARSENESS, CHRONIC 03/24/2007   PCP:  [name], NP Pharmacy:   [place], [place] [address] Phone: [phone] Fax: [phone]  [id number] - [place], [address] AT [address] [place] [address] Phone: [phone] Fax: [phone]  [id number] [address] [address]  [id number]: [phone]: [phone] [facility] [address]Phone: [phone]: [phone]    Social Determinants of Health (SDOH) Social History: [place] No Food Insecurity (05/30/2022)  Housing: Low Risk  (05/30/2022)  Transportation Needs: No Transportation Needs (05/30/2022)  Utilities: Not At Risk (05/30/2022)  Alcohol Screen: Low Risk  (03/26/2021)  Depression (PHQ2-9): Low Risk  (03/26/2021)  Financial Resource Strain: Low Risk  (03/26/2021)  Physical Activity: Inactive (03/26/2021)  Social Connections: Moderately Isolated (03/26/2021)  Stress: No Stress Concern Present (03/26/2021)  Tobacco Use: Medium Risk (06/17/2022)   SDOH Interventions:     Readmission Risk Interventions     No data to display

## 2022-07-01 NOTE — Progress Notes (Signed)
Physical Therapy Treatment Patient Details Name: Abigail Castillo MRN: [id number] DOB: 13-May-1951 Today's Date: 07/01/2022   History of Present Illness Abigail Castillo is a 72 y.o. F recently admitted for COPD flare, discharged to SNF who returned due to SpO2 65%. Found to be  RSV+, pCO2 80, started on BiPAP.   Head CT showing possible subacute infarct of the medial left cerebellar hemisphere. Failed bipap wean on 1/2, intubated. Tracheostomy 1/4.Transferred back to ICU and placed back on vent due to respiratory issues. Pt with COPD and chronic respiratory failure on 2-3L home O2, depression/anxiety and hx BrCA    PT Comments    Pt admitted with above diagnosis. Pt was able to sit EOB with min guard assist and was much more alert and following commands today.  Pt needed mod assist to stand and to pivot to chair fatiguing quickly.  Agree that pt would make a good LTAC candidate and updated the d/c plan to this today as well as updated frequency to 3x week.  Will continue to follow pt and progress pt as able. Pt currently with functional limitations due to balance and endurance deficits. Pt will benefit from skilled PT to increase their independence and safety with mobility to allow discharge to the venue listed below.      Recommendations for follow up therapy are one component of a multi-disciplinary discharge planning process, led by the attending physician.  Recommendations may be updated based on patient status, additional functional criteria and insurance authorization.  Follow Up Recommendations  PT at Long-term acute care hospital Can patient physically be transported by private vehicle: No   Assistance Recommended at Discharge Frequent or constant Supervision/Assistance  Patient can return home with the following A lot of help with bathing/dressing/bathroom;A little help with walking and/or transfers;Assist for transportation   Equipment Recommendations  Hospital bed;Wheelchair (measurements  PT);Wheelchair cushion (measurements PT);Other (comment)    Recommendations for Other Services       Precautions / Restrictions Precautions Precautions: Fall Precaution Comments: trach, feeding tube Restrictions Weight Bearing Restrictions: No     Mobility  Bed Mobility Overal bed mobility: Needs Assistance Bed Mobility: Supine to Sit, Sit to Supine Rolling: Supervision Sidelying to sit: Min assist (A for trunk) Supine to sit: Min assist (for trunk)     General bed mobility comments: Assist to elevate trunk and bring hips to EOB    Transfers Overall transfer level: Needs assistance Equipment used: Rolling walker (2 wheels) Transfers: Sit to/from Stand Sit to Stand: Min assist, From elevated surface, Mod assist Stand pivot transfers: Mod assist         General transfer comment: Pt stood to her feet  with incr weight on bil heels and immediately sat back down as pt fatigued and not balanced. Assisted pt more the second time and with cues pt was able to anterior lean and pivot to the chair with fatigue and needed assist and cues not to sit too soon. Pt desat to 90% therefore rested and then practiced 2 more sit to stands with same assist of min to mod assist with pt fatigue.  Working on pt not bearing all weight posteriorly.    Ambulation/Gait               General Gait Details: Need +2 to progress ambulation and pt fatigues quickly.   Stairs             Wheelchair Mobility    Modified Rankin (Stroke Patients Only)  Balance Overall balance assessment: Needs assistance Sitting-balance support: No upper extremity supported, Feet supported, Bilateral upper extremity supported Sitting balance-Leahy Scale: Poor Sitting balance - Comments: Tends to rely on at least L UE support on bed to sit EOB, min guard for safety   Standing balance support: Bilateral upper extremity supported Standing balance-Leahy Scale: Poor Standing balance comment: walker  and min to mod for static standing and pt couldnt sustain over a few seconds today                            Cognition   Behavior During Therapy: Flat affect Overall Cognitive Status: Difficult to assess                                 General Comments: Pt more alert and following commands today.        Exercises General Exercises - Lower Extremity Ankle Circles/Pumps: AAROM, Both, Supine, Seated, 15 reps Long Arc Quad: AROM, Both, 15 reps, Seated, Strengthening (>15 reps each but only achieving full AROM ~5-10x each leg) Heel Slides: AAROM, Both, 10 reps, Supine    General Comments General comments (skin integrity, edema, etc.): 40% trach collar , 113-118 bpm, 95% intiially and as low as 90% with activity, 148/70 initial BP and 132/83 is departure BP.      Pertinent Vitals/Pain Pain Assessment Pain Assessment: No/denies pain Faces Pain Scale: No hurt    Home Living                          Prior Function            PT Goals (current goals can now be found in the care plan section) Acute Rehab PT Goals Patient Stated Goal: agreeable to session Progress towards PT goals: Progressing toward goals    Frequency    Min 3X/week      PT Plan Discharge plan needs to be updated;Frequency needs to be updated    Co-evaluation              AM-PAC PT "6 Clicks" Mobility   Outcome Measure  Help needed turning from your back to your side while in a flat bed without using bedrails?: A Little Help needed moving from lying on your back to sitting on the side of a flat bed without using bedrails?: A Lot Help needed moving to and from a bed to a chair (including a wheelchair)?: A Lot Help needed standing up from a chair using your arms (e.g., wheelchair or bedside chair)?: A Lot Help needed to walk in hospital room?: Total Help needed climbing 3-5 steps with a railing? : Total 6 Click Score: 11    End of Session Equipment Utilized  During Treatment: Oxygen;Gait belt Activity Tolerance: Patient limited by fatigue Patient left: with call bell/phone within reach;in chair;with chair alarm set Nurse Communication: Mobility status PT Visit Diagnosis: Difficulty in walking, not elsewhere classified (R26.2);Unsteadiness on feet (R26.81);Other abnormalities of gait and mobility (R26.89);Muscle weakness (generalized) (M62.81)     Time: 5397-6734 PT Time Calculation (min) (ACUTE ONLY): 26 min  Charges:  $Therapeutic Exercise: 8-22 mins $Therapeutic Activity: 8-22 mins                     [facility] M,PT Acute Rehab Services [place] 07/01/2022, 11:13 AM

## 2022-07-01 NOTE — Progress Notes (Signed)
Speech Language Pathology Treatment: Abigail Castillo Speaking valve  Patient Details Name: Abigail Castillo MRN: [id number] DOB: 01/24/1951 Today's Date: 07/01/2022 Time: 1219-7588 SLP Time Calculation (min) (ACUTE ONLY): 16 min  Assessment / Plan / Recommendation Clinical Impression  Pt upright in chair and eating cups of ice chips provided by other staff. Per RN, increased secretions noted throughout the day with need for suction. SLP deflated cuff with immediate noted decreased SpO2 that further decreased during PMV trial lasting ~60 seconds (96% down to 85%). Pt able to produce one word vocalization that was hoarse and low in quality. Valve was removed to provide rest, however, SpO2 only slightly improved (88%) until cuff was inflated and SpO2 increased to 95%. In total, cuff was deflated for ~2 minutes. Due to fluctuations in pt's mentation and overall respiratory status, question her ability to consistently wear valve without SLP supervision. Recommend wearing PMV with SLP only.  Since pt is having ice chips, education provided for aspiration precautions (RN not sure if this was initiated by MD). If continuing to have POs from other staff, may need swallow study despite not consistently tolerating PMV yet. Swallow study not yet done as she has been having frequent changes in overall status, but will f/u with instrumental swallow study as pt is ready.    HPI HPI: Abigail Castillo is a 72 y.o. F recently admitted for COPD flare, discharged to SNF who returned 12/25 due to SpO2 65%. Found to be RSV+, pCO2 80, started on BiPAP. Head CT showing possible subacute infarct of the medial left cerebellar hemisphere. Initial bedside swallow eval 1/1 WFL. Failed bipap wean on 1/2, intubated. Tracheostomy 1/4. Pt with COPD and chronic respiratory failure on 2-3L home O2, depression/anxiety and hx BrCA      SLP Plan  Continue with current plan of care      Recommendations for follow up therapy are one component of a  multi-disciplinary discharge planning process, led by the attending physician.  Recommendations may be updated based on patient status, additional functional criteria and insurance authorization.    Recommendations         Patient may use Passy-Muir Speech Valve: with SLP only PMSV Supervision: Full         Oral Care Recommendations: Oral care BID Follow Up Recommendations: SLP at Long-term acute care hospital Assistance recommended at discharge: Frequent or constant Supervision/Assistance SLP Visit Diagnosis: Dysphagia, unspecified (R13.10);Aphonia (R49.1) Plan: Continue with current plan of care           Fabio Asa., Student SLP  07/01/2022, 12:31 PM

## 2022-07-01 NOTE — Progress Notes (Signed)
Nutrition Follow-up  DOCUMENTATION CODES:   Non-severe (moderate) malnutrition in context of acute illness/injury  INTERVENTION:   Continue bolus tube feeds via G-tube: - Osmolite 1.5 237 ml (1 carton) 5 times daily - PROSource TF20 60 ml daily - Free water flushes per MD, currently 200 ml q 6 hours  Tube feeding regimen provides 1855 kcal, 95 grams of protein, and 905 ml of H2O.  Total free water with flushes: 1705 ml  NUTRITION DIAGNOSIS:   Moderate Malnutrition related to acute illness (COPD exacerbation) as evidenced by mild fat depletion, mild muscle depletion, percent weight loss.  Ongoing, being addressed via TF  GOAL:   Patient will meet greater than or equal to 90% of their needs  Met via TF  MONITOR:   Diet advancement, TF tolerance, Skin, Labs, Weight trends  REASON FOR ASSESSMENT:   Consult Assessment of nutrition requirement/status  ASSESSMENT:   Pt admitted from [facility] with SOB r/t COPD exacerbation. PMH significant for breast cancer (2022), COPD on 2-3L home O2, depression/anxiety. Recently admitted 11/15-12/18 for COPD exacerbation.  12/25 - admitted 01/02 - intubated 01/03 - Cortrak placement 01/04 - trach placement 01/23 - IR placement 20 Fr G-tube  Discussed pt with RN and during ICU rounds. Pt required vent support overnight, now on ATC. Free water flushes resumed yesterday due to hypernatremia.  Pt sleeping in recliner at time of RD visit. Pending LTACH placement. Per RN, tolerating bolus feeds without issue.  Admit weight: 61.7 kg Current weight: 59.5 kg  Current TF: Osmolite 1.5 237 ml 5 x daily, PROSource TF20 60 ml daily, free water 200 ml q 6 hours  Medications reviewed and include: calcium carbonate, vitamin B12 1000 mcg daily, pepcid, IV lasix, SSI, semglee 8 units daily, MVI with minerals, prednisone  Labs reviewed: sodium 146, BUN 31, hemoglobin 9.4, platelets 142 CBG's: 95-194 x 24 hours  UOP: 1000 ml x 24  hours I/O's: +5.8 L since admit  Diet Order:   Diet Order     None       EDUCATION NEEDS:   Not appropriate for education at this time  Skin:  Skin Assessment: Skin Integrity Issues: Other: MASD to buttocks  Last BM:  06/30/22 medium type 3  Height:   Ht Readings from Last 1 Encounters:  06/12/22 '5\' 2"'$  (1.575 m)    Weight:   Wt Readings from Last 1 Encounters:  06/30/22 59.5 kg    BMI:  Body mass index is 23.99 kg/m.  Estimated Nutritional Needs:   Kcal:  1700-1900 kcal/d  Protein:  90-105 grams  Fluid:  1.8-2 L/d    Gustavus Bryant, MS, RD, LDN Inpatient Clinical Dietitian Please see [facility] for contact information.

## 2022-07-01 NOTE — Progress Notes (Signed)
Occupational Therapy Treatment Patient Details Name: Abigail Castillo MRN: [id number] DOB: 11/07/1950 Today's Date: 07/01/2022   History of present illness Abigail Castillo is a 71 y.o. F recently admitted for COPD flare, discharged to SNF who returned due to SpO2 65%. Found to be  RSV+, pCO2 80, started on BiPAP.   Head CT showing possible subacute infarct of the medial left cerebellar hemisphere. Failed bipap wean on 1/2, intubated. Tracheostomy 1/4.Transferred back to ICU and placed back on vent due to respiratory issues. Pt with COPD and chronic respiratory failure on 2-3L home O2, depression/anxiety and hx BrCA   OT comments  Abigail Castillo continues to perform similarly to past OT session, generally limited by pulmonary endurance, weakness and fatigue. She required mod A to stand with the stedy frame 3x, and tolerated standing about 30 seconds each trail. Prolonged rest needed for O2 saturation recovery from 87% to >90%. Pt with incontinent BM and needed total A for hygiene in standing. Pt with flat affect throughout needing increased time and cues for all commands and questions. OT to continue to follow acutely. POC remains appropriate.    Recommendations for follow up therapy are one component of a multi-disciplinary discharge planning process, led by the attending physician.  Recommendations may be updated based on patient status, additional functional criteria and insurance authorization.    Follow Up Recommendations  OT at Long-term acute care hospital     Assistance Recommended at Discharge Frequent or constant Supervision/Assistance  Patient can return home with the following  Two people to help with walking and/or transfers;Two people to help with bathing/dressing/bathroom;Assistance with feeding;Direct supervision/assist for medications management;Assistance with cooking/housework;Direct supervision/assist for financial management;Assist for transportation;Help with stairs or ramp for entrance          Precautions / Restrictions Precautions Precautions: Fall Precaution Comments: trach, feeding tube Restrictions Weight Bearing Restrictions: No       Mobility Bed Mobility Overal bed mobility: Needs Assistance Bed Mobility: Sit to Supine     Supine to sit: Mod assist     General bed mobility comments: increased time and cues needed    Transfers Overall transfer level: Needs assistance Equipment used: Ambulation equipment used Transfers: Sit to/from Stand, Bed to chair/wheelchair/BSC Sit to Stand: Mod assist           General transfer comment: stood 3x with stedy frame Transfer via Lift Equipment: Stedy   Balance Overall balance assessment: Needs assistance Sitting-balance support: No upper extremity supported, Feet supported, Bilateral upper extremity supported Sitting balance-Leahy Scale: Poor     Standing balance support: Bilateral upper extremity supported Standing balance-Leahy Scale: Poor                             ADL either performed or assessed with clinical judgement   ADL                           Toilet Transfer: Total assistance Toilet Transfer Details (indicate cue type and reason): stedy Toileting- Clothing Manipulation and Hygiene: Total assistance;Sit to/from stand Toileting - Clothing Manipulation Details (indicate cue type and reason): total A for peri care after incontinent BM, standing in stedy frame     Functional mobility during ADLs: Moderate assistance (to stand in stedy frame) General ADL Comments: stood 3x with stedy, only tolerating standing fro ~30 seconds at a time    Extremity/Trunk Assessment Upper Extremity Assessment Upper Extremity Assessment: Generalized  weakness RUE Deficits / Details: progressing in strength, moving well against gravity. globally 3/5 LUE Deficits / Details: progressing in strength, moving well against gravity. globally 3/5   Lower Extremity Assessment Lower Extremity  Assessment: Defer to PT evaluation        Vision   Vision Assessment?: No apparent visual deficits   Perception Perception Perception: Within Functional Limits   Praxis Praxis Praxis: Intact    Cognition Arousal/Alertness: Lethargic Behavior During Therapy: Flat affect Overall Cognitive Status: Difficult to assess                                 General Comments: flat, requires repetition and increased time              General Comments SpO2 down to 87% after standing, prolonged time needed to recover to >90    Pertinent Vitals/ Pain       Pain Assessment Pain Assessment: Faces Faces Pain Scale: No hurt Pain Intervention(s): Monitored during session   Frequency  Min 2X/week        Progress Toward Goals  OT Goals(current goals can now be found in the care plan section)  Progress towards OT goals: Progressing toward goals  Acute Rehab OT Goals Patient Stated Goal: to get back to bed OT Goal Formulation: With patient Time For Goal Achievement: 07/09/22 Potential to Achieve Goals: Fair ADL Goals Pt Will Perform Grooming: with set-up;sitting Pt Will Perform Lower Body Bathing: with modified independence;sit to/from stand Pt Will Perform Upper Body Dressing: with supervision;with set-up;sitting Pt Will Perform Lower Body Dressing: with modified independence;sit to/from stand Pt Will Transfer to Toilet: with modified independence;ambulating;bedside commode Pt Will Perform Toileting - Clothing Manipulation and hygiene: with modified independence;sit to/from stand Pt/caregiver will Perform Home Exercise Program: Increased strength;Both right and left upper extremity;With Supervision Additional ADL Goal #1: Pt will be S in and OOB for basic ADLs Additional ADL Goal #2: Pt will be S for sitting balance at EOB while doing basic ADLs  Plan Discharge plan remains appropriate       AM-PAC OT "6 Clicks" Daily Activity     Outcome Measure   Help from  another person eating meals?: Total Help from another person taking care of personal grooming?: A Lot Help from another person toileting, which includes using toliet, bedpan, or urinal?: Total Help from another person bathing (including washing, rinsing, drying)?: A Lot Help from another person to put on and taking off regular upper body clothing?: A Lot Help from another person to put on and taking off regular lower body clothing?: Total 6 Click Score: 9    End of Session Equipment Utilized During Treatment: Oxygen  OT Visit Diagnosis: Muscle weakness (generalized) (M62.81);Other abnormalities of gait and mobility (R26.89);Unsteadiness on feet (R26.81)   Activity Tolerance Patient tolerated treatment well   Patient Left in bed;with call bell/phone within reach;with bed alarm set   Nurse Communication Mobility status        Time: 1243-1310 OT Time Calculation (min): 27 min  Charges: OT General Charges $OT Visit: 1 Visit OT Treatments $Self Care/Home Management : 23-37 mins  Shade Flood, OTR/L [place] Office [phone] Secure Chat Communication Preferred  Elliot Cousin 07/01/2022, 1:56 PM

## 2022-07-01 NOTE — Progress Notes (Signed)
  Progress Note   Patient: Abigail Castillo VPC:[id number] DOB: 12-May-1951 DOA: 05/19/2022     43 DOS: the patient was seen and examined on 07/01/2022   Brief hospital course:  Assessment and Plan: * Acute on chronic respiratory failure with hypoxia and hypercapnia (HCC) - Today on trach collar  - Brovana 15 mcg bid  - Pulmicort 0.5 mg bid  - Azithromycin 250 mg PO daily  - Prednisone 10 mg PO daily  - Xopenex q3 hr PRN  - Claritin 10 mg daily  - Yupelri 175 mcg daily  - Versed 1 mg q2 hr PRN  - [facility] by case manager 07/01/2022   RSV (respiratory syncytial virus pneumonia) - Management as above   COPD with acute exacerbation ([place]) - Management as above   Malnutrition of moderate degree - Osmolite 1.5 cal - PROsource 60 mL daily  - Banatrol TF 60 mL tid  - MVI daily    Essential hypertension - Lopressor 25 mg bid    Hyponatremia - Monitor    Ductal carcinoma in situ (DCIS) of left breast - Arimidex 1 mg daily    Depression - Buspar 5 mg tid  - Olanzapine 10 mg daily  - Clonazepam 1 mg bid PRN  and 1 mg bedtime    L frontal CVA - ASA 81 mg daily  - Lipitor 40 mg daily    Prediabetes - A1c 6.1  - Novolog SS q4 hr - Semglee 8 units sq daily    DVT prophylaxis: Lovenox 40 mg sq daily  GI prophylaxis: Pepcid 20 mg daily       Subjective: Pt seen and examined at the bedside. Pt back on trach collar today. Case management sent out a referral to [facility] today (LTAC). Azithromycin and prednisone added by ICU physician.  Physical Exam: Vitals:   07/01/22 0748 07/01/22 0800 07/01/22 0900 07/01/22 1000  BP:  135/87 (!) 148/70   Pulse:  (!) 113 (!) 108 (!) 119  Resp:  (!) 21 (!) 25 18  Temp: 98.5 F (36.9 C)     TempSrc: Oral     SpO2:  99% 98% 95%  Weight:      Height:       Constitutional:      Comments: Awake   HENT:     Head: Normocephalic.     Mouth/Throat:     Mouth: Mucous membranes are moist.  Neck:     Comments: Trach in  place Cardiovascular:     Rate and Rhythm: Normal rate and regular rhythm.  Pulmonary:     Breath sounds: Wheezing present.  Abdominal:     General: Abdomen is flat.     Palpations: Abdomen is soft.  Skin:    General: Skin is warm.  Neurological:     Comments: Awake but unable to ascertain full neurological status at this time   Psychiatric:     Comments: Unable to ascertain    Data Reviewed:   Disposition: Status is: Inpatient  Planned Discharge Destination: LTAC    Time spent: 35 minutes  Author: Lucienne Minks , MD 07/01/2022 10:58 AM  For on call review [email or web address].

## 2022-07-01 NOTE — Progress Notes (Signed)
NAME:  Abigail Castillo, MRN:  [id number], DOB:  10/21/1950, LOS: 19 ADMISSION DATE:  05/19/2022, CONSULTATION DATE:  05/20/22 REFERRING MD:  Roger Shelter CHIEF COMPLAINT:  Dyspnea   History of Present Illness:  Abigail Castillo is a 72 y.o. female who has a PMH as below including but not limited to COPD on chronic 2-3L O2 and '10mg'$  Prednisone, OSA on nocturnal BiPAP, and who is followed by Dr. Lamonte Sakai in our office. She had recent admission 11/15 through 12/18 for AECOPD and was then discharged to [facility].  She was recently seen as an outpatient by Dr. Lamonte Sakai on 12/22 and was prescribed a slow prednisone taper with instructions to drop to '10mg'$  and continue until her next follow up appointment.  She presented back to [facility] ED 12/25 with dyspnea and hypoxia down to 65%. She was found to have hypoxic and hypercapnic respiratory failure and her RSV swab returned positive. She was placed on BiPAP and was admitted by [facility].  On 12/26, PCCM was consulted for assistance with BiPAP and COPD management. Failed to wean BiPAP and ultimately required intubation 1/2. Tracheostomy placed 1/4. Throughout stay tolerated TC for periods, however requiring periods of vent. LTAC denied. 1/23 PEG placed. 1/29 tolerating trach collar.   2/2 hypoxic and SOB on 15L Trach Collar. Transferred to ICU. Placed on Vent. Trach exchanged.   Pertinent  Medical History:  has History of tobacco abuse; Depression; COPD (chronic obstructive pulmonary disease) ([place]); Osteoporosis; HOARSENESS, CHRONIC; HYPERTRIGLYCERIDEMIA; Memory change; B12 deficiency; Personal history of colonic polyps; Encounter for routine gynecological examination; Mild hyperlipidemia; Vitamin D deficiency; Esophageal stricture; GERD (gastroesophageal reflux disease); Schizoaffective disorder ([place]); Allergic rhinitis; Chronic respiratory failure ([place]); Acute on chronic respiratory failure with hypoxia ([place]); Elevated blood pressure reading without diagnosis of hypertension;  Hepatic steatosis; Ductal carcinoma in situ (DCIS) of left breast; On home oxygen therapy; Hyponatremia; Bronchomalacia; Localized edema; COPD exacerbation ([place]); Tracheobronchomalacia; Acute on chronic respiratory failure with hypoxia and hypercapnia (HCC); COPD with acute exacerbation ([place]); RSV (respiratory syncytial virus pneumonia); Essential hypertension; Malnutrition of moderate degree; Acute respiratory failure with hypercapnia ([place]); Pressure injury of skin; and Tracheostomy in place [facility]) on their problem list.  Significant Hospital Events: Including procedures, antibiotic start and stop dates in addition to other pertinent events   12/25 admit. 12/26 PCCM consult. 1/2 Failed BiPAP wean, Intubated 1/3 CT Head negative was completed for observed pupillary change 1/4 tracheostomy 1/8 CT head for anisocoria; neg,  1/11 continuing progressive trach collar trials 1/12 BID trach collar trials, appears comfortable  1/14 failed PSV 1/15 pulse prednisone at 40 mg continues to fail SBT trials LTAC denied awaiting vent SNF 1/16 tolerated 1 hr atc. ASA placed on hold at request of IR for PEG.  1/23 PEG placement 1/25 placed back on full support for fatigue/ increased WOB after ATC x 25hrs, softer Bps 1/27 was able to tolerate trach collar for better part of prior day.  Rested at nighttime.  Stood with assistance 1/29 AM, indefinite trach collar 2/2 Back to ICU requiring vent. Trach Exchanged due to cuff leak.  06/30/2022 >> [facility], PCCM for vent management only  Interim History / Subjective:  Was on PRVC overnight, currently on ATC I/O+ 5.8 L total Na 149 > 146  Objective:  Blood pressure 91/64, pulse (!) 108, temperature 98.5 F (36.9 C), temperature source Oral, resp. rate (!) 23, height '5\' 2"'$  (1.575 m), weight 59.5 kg, SpO2 98 %.    Vent Mode: PRVC FiO2 (%):  [40 %]  40 % Set Rate:  [22 bmp] 22 bmp Vt Set:  [400 mL] 400 mL PEEP:  [5 cmH20] 5 cmH20 Plateau Pressure:  [15  cmH20-22 cmH20] 18 cmH20   Intake/Output Summary (Last 24 hours) at 07/01/2022 0750 Last data filed at 07/01/2022 0600 Gross per 24 hour  Intake 900 ml  Output 1000 ml  Net -100 ml   Filed Weights   06/27/22 0500 06/29/22 0444 06/30/22 0500  Weight: 58.8 kg 59.6 kg 59.5 kg   Physical Examination: General: Chronically ill-appearing woman, laying in bed, globally weak.  On ATC HEENT: Edematous facies.  Trach in place, no significant secretions Resp: Very distant, coarse bilaterally.  End expiratory wheezing ABD: Nondistended, PEG in place.  Positive bowel sounds Neuro alert, interacts, follows commands Ext: -Trace lower extremity edema      Assessment & Plan:   Acute on chronic respiratory failure with hypoxia/hypercarbia s/p trach Acute COPD exacerbation in the setting of RSV pneumonia > Resolved  Plan -Continue ATC as tolerated.  Consider maintenance PSV. -Continue standard trach care -Suctioning as indicated -Downsize trach as indicated depending on her overall progress, ability to wean.  Unclear whether she will ever be a decannulation candidate -Continue PT, OT, SLP -Bronchodilator regimen: Candiss Norse, Xopenex as needed -Continue Pulmicort nebs -We had presumed in the office that she would be steroid-dependent, target dose was prednisone 10 mg daily.  Given the severity of her disease would start this now.  Will also start daily chronic azithromycin 259 -Diuresis as blood pressure, renal function will tolerate, no hypernatremia -Suspect she may be headed for LTAC for continued vent weaning.    Hypernatremia Na 149  > 146 Plan Increase free water to 200 cc every 6 hours on 2/6 as we work on diuresis Follow intermittent BMP      Baltazar Apo, MD, PhD 07/01/2022, 8:00 AM [facility] [phone] or if no answer before 7:00PM call [phone] For any issues after 7:00PM please call eLink [phone]

## 2022-07-01 NOTE — Progress Notes (Signed)
Placed patient on 40% trach collar

## 2022-07-02 LAB — POCT I-STAT 7, (LYTES, BLD GAS, ICA,H+H)
Acid-Base Excess: 18 mmol/L — ABNORMAL HIGH (ref 0.0–2.0)
Bicarbonate: 46.6 mmol/L — ABNORMAL HIGH (ref 20.0–28.0)
Calcium, Ion: 1.28 mmol/L (ref 1.15–1.40)
HCT: 26 % — ABNORMAL LOW (ref 36.0–46.0)
Hemoglobin: 8.8 g/dL — ABNORMAL LOW (ref 12.0–15.0)
O2 Saturation: 96 %
Patient temperature: 98.6
Potassium: 3.8 mmol/L (ref 3.5–5.1)
Sodium: 143 mmol/L (ref 135–145)
TCO2: 49 mmol/L — ABNORMAL HIGH (ref 22–32)
pCO2 arterial: 83.9 mmHg (ref 32–48)
pH, Arterial: 7.353 (ref 7.35–7.45)
pO2, Arterial: 96 mmHg (ref 83–108)

## 2022-07-02 LAB — COMPREHENSIVE METABOLIC PANEL
ALT: 22 U/L (ref 0–44)
AST: 19 U/L (ref 15–41)
Albumin: 2.7 g/dL — ABNORMAL LOW (ref 3.5–5.0)
Alkaline Phosphatase: 86 U/L (ref 38–126)
Anion gap: 9 (ref 5–15)
BUN: 26 mg/dL — ABNORMAL HIGH (ref 8–23)
CO2: 38 mmol/L — ABNORMAL HIGH (ref 22–32)
Calcium: 8.9 mg/dL (ref 8.9–10.3)
Chloride: 95 mmol/L — ABNORMAL LOW (ref 98–111)
Creatinine, Ser: 0.47 mg/dL (ref 0.44–1.00)
GFR, Estimated: >60 mL/min (ref >60)
Glucose, Bld: 107 mg/dL — ABNORMAL HIGH (ref 70–99)
Potassium: 3.6 mmol/L (ref 3.5–5.1)
Sodium: 142 mmol/L (ref 135–145)
Total Bilirubin: 1.3 mg/dL — ABNORMAL HIGH (ref 0.3–1.2)
Total Protein: 5.5 g/dL — ABNORMAL LOW (ref 6.5–8.1)

## 2022-07-02 LAB — CBC
HCT: 30.3 % — ABNORMAL LOW (ref 36.0–46.0)
Hemoglobin: 9.3 g/dL — ABNORMAL LOW (ref 12.0–15.0)
MCH: 31.4 pg (ref 26.0–34.0)
MCHC: 30.7 g/dL (ref 30.0–36.0)
MCV: 102.4 fL — ABNORMAL HIGH (ref 80.0–100.0)
Platelets: 136 10*3/uL — ABNORMAL LOW (ref 150–400)
RBC: 2.96 MIL/uL — ABNORMAL LOW (ref 3.87–5.11)
RDW: 16.3 % — ABNORMAL HIGH (ref 11.5–15.5)
WBC: 8.6 10*3/uL (ref 4.0–10.5)
nRBC: 0.2 % (ref 0.0–0.2)

## 2022-07-02 LAB — MAGNESIUM: Magnesium: 1.9 mg/dL (ref 1.7–2.4)

## 2022-07-02 LAB — GLUCOSE, CAPILLARY
Glucose-Capillary: 105 mg/dL — ABNORMAL HIGH (ref 70–99)
Glucose-Capillary: 140 mg/dL — ABNORMAL HIGH (ref 70–99)
Glucose-Capillary: 229 mg/dL — ABNORMAL HIGH (ref 70–99)
Glucose-Capillary: 196 mg/dL — ABNORMAL HIGH (ref 70–99)
Glucose-Capillary: 180 mg/dL — ABNORMAL HIGH (ref 70–99)
Glucose-Capillary: 177 mg/dL — ABNORMAL HIGH (ref 70–99)

## 2022-07-02 LAB — PHOSPHORUS: Phosphorus: 3.8 mg/dL (ref 2.5–4.6)

## 2022-07-02 MED ORDER — POTASSIUM CHLORIDE 20 MEQ PO PACK
40.0000 meq | PACK | Freq: Once | ORAL | Status: AC
  Administered 2022-07-02: 40 meq
  Filled 2022-07-02: qty 2

## 2022-07-02 NOTE — Progress Notes (Signed)
  Progress Note   Patient: Abigail Castillo MVE:[id number] DOB: [birth year] DOA: 05/19/2022     44 DOS: the patient was seen and examined on 07/02/2022   Brief hospital course:  Assessment and Plan: * Acute on chronic respiratory failure with hypoxia and hypercapnia (HCC) - Continue on trach collar  - Brovana 15 mcg bid  - Pulmicort 0.5 mg bid  - Azithromycin 250 mg PO daily  - Prednisone 10 mg PO daily  - Xopenex q3 hr PRN  - Claritin 10 mg daily  - Yupelri 175 mcg daily  - Versed 1 mg q2 hr PRN  - Case manager working on disposition to [place]   RSV (respiratory syncytial virus pneumonia) - Management as above   COPD with acute exacerbation ([place]) - Management as above   Malnutrition of moderate degree - Osmolite 1.5 cal - PROsource 60 mL daily  - Banatrol TF 60 mL tid  - MVI daily    Essential hypertension - Lopressor 25 mg bid    Hyponatremia - Resolved    Ductal carcinoma in situ (DCIS) of left breast - Arimidex 1 mg daily    Depression - Buspar 5 mg tid  - Olanzapine 10 mg daily  - Clonazepam 1 mg bid PRN  and 1 mg bedtime    L frontal CVA - ASA 81 mg daily  - Lipitor 40 mg daily    Prediabetes - A1c 6.1  - Novolog SS q4 hr - Semglee 8 units sq daily    DVT prophylaxis: Lovenox 40 mg sq daily  GI prophylaxis: Pepcid 20 mg daily       Subjective: Pt seen and examined at the bedside. Case manager working on disposition to [employer].   Physical Exam: Vitals:   07/02/22 0736 07/02/22 0800 07/02/22 1100 07/02/22 1146  BP:  102/65    Pulse:  93    Resp:  (!) 25 (!) 25   Temp: 98.6 F (37 C)   98.2 F (36.8 C)  TempSrc: Oral   Oral  SpO2:  99%    Weight:      Height:       Constitutional:      Comments: Awake   HENT:     Head: Normocephalic.     Mouth/Throat:     Mouth: Mucous membranes are moist.  Neck:     Comments: Trach in place Cardiovascular:     Rate and Rhythm: Normal rate and regular rhythm.  Pulmonary:     Breath  sounds: Wheezing present.  Abdominal:     General: Abdomen is flat.     Palpations: Abdomen is soft.  Skin:    General: Skin is warm.  Neurological:     Comments: Awake but unable to ascertain full neurological status at this time   Psychiatric:     Comments: Unable to ascertain    Data Reviewed:   Disposition: Status is: Inpatient  Planned Discharge Destination: LTAC    Time spent: 35 minutes  Author: Lucienne Minks , MD 07/02/2022 12:40 PM  For on call review [email or web address].

## 2022-07-03 LAB — COMPREHENSIVE METABOLIC PANEL
ALT: 22 U/L (ref 0–44)
AST: 25 U/L (ref 15–41)
Albumin: 2.7 g/dL — ABNORMAL LOW (ref 3.5–5.0)
Alkaline Phosphatase: 95 U/L (ref 38–126)
Anion gap: 6 (ref 5–15)
BUN: 25 mg/dL — ABNORMAL HIGH (ref 8–23)
CO2: 45 mmol/L — ABNORMAL HIGH (ref 22–32)
Calcium: 9.1 mg/dL (ref 8.9–10.3)
Chloride: 90 mmol/L — ABNORMAL LOW (ref 98–111)
Creatinine, Ser: 0.46 mg/dL (ref 0.44–1.00)
GFR, Estimated: >60 mL/min (ref >60)
Glucose, Bld: 150 mg/dL — ABNORMAL HIGH (ref 70–99)
Potassium: 3.8 mmol/L (ref 3.5–5.1)
Sodium: 141 mmol/L (ref 135–145)
Total Bilirubin: 1.4 mg/dL — ABNORMAL HIGH (ref 0.3–1.2)
Total Protein: 5.5 g/dL — ABNORMAL LOW (ref 6.5–8.1)

## 2022-07-03 LAB — MAGNESIUM: Magnesium: 2 mg/dL (ref 1.7–2.4)

## 2022-07-03 LAB — CBC
HCT: 31.4 % — ABNORMAL LOW (ref 36.0–46.0)
Hemoglobin: 9.8 g/dL — ABNORMAL LOW (ref 12.0–15.0)
MCH: 32 pg (ref 26.0–34.0)
MCHC: 31.2 g/dL (ref 30.0–36.0)
MCV: 102.6 fL — ABNORMAL HIGH (ref 80.0–100.0)
Platelets: 148 10*3/uL — ABNORMAL LOW (ref 150–400)
RBC: 3.06 MIL/uL — ABNORMAL LOW (ref 3.87–5.11)
RDW: 16.3 % — ABNORMAL HIGH (ref 11.5–15.5)
WBC: 7.3 10*3/uL (ref 4.0–10.5)
nRBC: 0 % (ref 0.0–0.2)

## 2022-07-03 LAB — GLUCOSE, CAPILLARY
Glucose-Capillary: 100 mg/dL — ABNORMAL HIGH (ref 70–99)
Glucose-Capillary: 161 mg/dL — ABNORMAL HIGH (ref 70–99)
Glucose-Capillary: 267 mg/dL — ABNORMAL HIGH (ref 70–99)
Glucose-Capillary: 271 mg/dL — ABNORMAL HIGH (ref 70–99)
Glucose-Capillary: 135 mg/dL — ABNORMAL HIGH (ref 70–99)

## 2022-07-03 NOTE — Progress Notes (Signed)
RT NOTE:  Report from dayshift RT to leave patient on vent overnight per MD recommendation.

## 2022-07-03 NOTE — TOC Progression Note (Signed)
Transition of Care [facility]) - Initial/Assessment Note    Patient Details  Name: Abigail Castillo MRN: [id number] Date of Birth: [birth year]  Transition of Care [facility]) CM/SW Contact:    Milinda Antis, [place] Phone Number: 07/03/2022, 2:35 PM  Clinical Narrative:                 LCSW received a call from Amy with [facility] stating that insurance Josem Kaufmann has been approved for [facility].  [facility] was informed.  The facility can accept the patient tomorrow pending medical readiness.   TOC following.   Expected Discharge Plan: Skilled Nursing Facility Barriers to Discharge: Ship broker, Continued Medical Work up, SNF Pending bed offer   Patient Goals and CMS Choice   CMS Medicare.gov Compare Post Acute Care list provided to:: Patient Represenative (must comment) (Patients daughter Candice) Choice offered to / list presented to : Adult Children (Patients daughter Event organiser) [place] ownership interest in [facility].provided to:: Adult Children    Expected Discharge Plan and Services In-house Referral: Clinical Social Work   Post Acute Care Choice: Long Term Acute Care (LTAC) Living arrangements for the past 2 months:  (PTA came from [place] short term before that was from home alone)                                      Prior Living Arrangements/Services Living arrangements for the past 2 months:  (PTA came from [place] short term before that was from home alone)   Patient language and need for interpreter reviewed:: Yes Do you feel safe going back to the place where you live?: No   Vent/SNF  Need for Family Participation in Patient Care: Yes (Comment) Care giver support system in place?: Yes (comment)   Criminal Activity/Legal Involvement Pertinent to Current Situation/Hospitalization: No - Comment as needed  Activities of Daily Living Home Assistive Devices/Equipment: Eyeglasses, Oxygen ADL Screening (condition at time of  admission) Patient's cognitive ability adequate to safely complete daily activities?: Yes Is the patient deaf or have difficulty hearing?: No Does the patient have difficulty seeing, even when wearing glasses/contacts?: No Does the patient have difficulty concentrating, remembering, or making decisions?: No Patient able to express need for assistance with ADLs?: Yes Does the patient have difficulty dressing or bathing?: No Independently performs ADLs?: Yes (appropriate for developmental age) Does the patient have difficulty walking or climbing stairs?: No Weakness of Legs: None Weakness of Arms/Hands: None  Permission Sought/Granted Permission sought to share information with : Case Manager, Family Supports, Chartered certified accountant granted to share information with : No  Share Information with NAME: Due to patients orientation CSW spoke with [employer]  Permission granted to share info w AGENCY: Vent/SNF  Permission granted to share info w Relationship: daughter  Permission granted to share info w Contact Information: Due to patients orientation CSW spoke with Candice,Candice [phone]  Emotional Assessment       Orientation: :  (intubated/trach) Alcohol / Substance Use: Not Applicable Psych Involvement: No (comment)  Admission diagnosis:  COPD exacerbation ([place]) [J44.1] Acute respiratory failure with hypercapnia ([place]) [J96.02] COPD with acute exacerbation ([place]) [J44.1] Patient Active Problem List   Diagnosis Date Noted   Tracheostomy in place ([place]) 06/08/2022   Acute respiratory failure with hypercapnia ([place]) 05/28/2022   Pressure injury of skin 05/28/2022   Malnutrition of moderate degree 05/20/2022   COPD with acute exacerbation ([place])  05/19/2022   RSV (respiratory syncytial virus pneumonia) 05/19/2022   Essential hypertension 05/19/2022   Tracheobronchomalacia 04/27/2022   Acute on chronic respiratory failure with hypoxia and hypercapnia (HCC) 04/27/2022    Bronchomalacia 04/25/2022   Localized edema 04/25/2022   COPD exacerbation ([place]) 04/25/2022   Hyponatremia 04/10/2022   On home oxygen therapy 12/24/2021   Ductal carcinoma in situ (DCIS) of left breast 06/03/2021   Hepatic steatosis 05/13/2021   Elevated blood pressure reading without diagnosis of hypertension 09/14/2020   Acute on chronic respiratory failure with hypoxia ([place]) 07/05/2019   Chronic respiratory failure ([place]) 08/25/2017   Allergic rhinitis 06/16/2017   Schizoaffective disorder ([place]) 06/30/2016   GERD (gastroesophageal reflux disease) 11/02/2015   Esophageal stricture 03/14/2015   Mild hyperlipidemia 02/03/2014   Vitamin D deficiency 02/03/2014   Encounter for routine gynecological examination 06/14/2013   Personal history of colonic polyps 03/10/2012   B12 deficiency 11/11/2010   Memory change 11/04/2010   HYPERTRIGLYCERIDEMIA 06/14/2010   Depression 05/31/2009   History of tobacco abuse 03/02/2008   Osteoporosis 04/05/2007   COPD (chronic obstructive pulmonary disease) ([place]) 03/24/2007   HOARSENESS, CHRONIC 03/24/2007   PCP:  [name], NP Pharmacy:   [facility], [place] [address] Phone: [phone] Fax: [phone]  [id number] - [address], [address] AT [address] [place] [address] Phone: [phone] Fax: [phone]  [id number] [address] [place]Phone: [phone]: [phone] [facility] [address]: [phone]: [phone]    Social Determinants of Health (SDOH) Social History: [place] No Food Insecurity (05/30/2022)  Housing: Low Risk  (05/30/2022)   Transportation Needs: No Transportation Needs (05/30/2022)  Utilities: Not At Risk (05/30/2022)  Alcohol Screen: Low Risk  (03/26/2021)  Depression (PHQ2-9): Low Risk  (03/26/2021)  Financial Resource Strain: Low Risk  (03/26/2021)  Physical Activity: Inactive (03/26/2021)  Social Connections: Moderately Isolated (03/26/2021)  Stress: No Stress Concern Present (03/26/2021)  Tobacco Use: Medium Risk (06/17/2022)   SDOH Interventions:     Readmission Risk Interventions     No data to display

## 2022-07-03 NOTE — Progress Notes (Signed)
  Progress Note   Patient: Abigail Castillo [id number]:[id number] DOB: 07-06-1950 DOA: 05/19/2022     45 DOS: the patient was seen and examined on 07/03/2022   Brief hospital course:  Assessment and Plan: * Acute on chronic respiratory failure with hypoxia and hypercapnia (HCC) - Continue on trach collar  - Brovana 15 mcg bid  - Pulmicort 0.5 mg bid  - Azithromycin 250 mg PO daily  - Prednisone 10 mg PO daily  - Xopenex q3 hr PRN  - Claritin 10 mg daily  - Yupelri 175 mcg daily  - Versed 1 mg q2 hr PRN  - Case manager working on disposition to [place]   RSV (respiratory syncytial virus pneumonia) - Management as above   COPD with acute exacerbation ([place]) - Management as above   Malnutrition of moderate degree - Osmolite 1.5 cal - PROsource 60 mL daily  - Banatrol TF 60 mL tid  - MVI daily    Essential hypertension - Lopressor 25 mg bid    Hyponatremia - Resolved    Ductal carcinoma in situ (DCIS) of left breast - Arimidex 1 mg daily    Depression - Buspar 5 mg tid  - Olanzapine 10 mg daily  - Clonazepam 1 mg bid PRN  and 1 mg bedtime    L frontal CVA - ASA 81 mg daily  - Lipitor 40 mg daily    Prediabetes - A1c 6.1  - Novolog SS q4 hr - Semglee 8 units sq daily    DVT prophylaxis: Lovenox 40 mg sq daily  GI prophylaxis: Pepcid 20 mg daily       Subjective: Pt seen and examined at the bedside. Per nursing the pt is still having some desaturations and did not pass SLP. Disposition per case manager.   UPDATE:Per LCSW Reandra F Blade the pt could go to [facility] tmr (Fri 07/04/2022 at the earliest) with [facility] transportation. If this is the case, pt would likely need a vent in the ambulance for transportation to [place].   Physical Exam: Vitals:   07/03/22 1100 07/03/22 1134 07/03/22 1145 07/03/22 1200  BP:  131/70  135/86  Pulse: (!) 121 (!) 123  (!) 122  Resp: (!) 29 (!) 25  (!) 26  Temp:   97.9 F (36.6 C)   TempSrc:   Oral   SpO2: 94% 96%  92%   Weight:      Height:       Constitutional:      Comments: Awake   HENT:     Head: Normocephalic.     Mouth/Throat:     Mouth: Mucous membranes are moist.  Neck:     Comments: Trach in place Cardiovascular:     Rate and Rhythm: Tachycardia  Pulmonary:     Breath sounds: Wheezing present.  Abdominal:     General: Abdomen is flat.     Palpations: Abdomen is soft.  Skin:    General: Skin is warm.  Neurological:     Comments: Awake but unable to ascertain full neurological status at this time   Psychiatric:     Comments: Unable to ascertain    Data Reviewed:   Disposition: Status is: Inpatient  Planned Discharge Destination: LTAC    Time spent: 35 minutes  Author: Lucienne Minks , MD 07/03/2022 12:42 PM  For on call review [email or web address].

## 2022-07-03 NOTE — Progress Notes (Signed)
Physical Therapy Treatment Patient Details Name: Abigail Castillo MRN: [id number] DOB: May 23, 1951 Today's Date: 07/03/2022   History of Present Illness 72 y.o. female admitted 05/19/22 (after D/C 12/18) from [facility] SNF with RSV +, with acute on chronic respiratory failure.   Head CT showing possible subacute infarct of the medial left cerebellar hemisphere. 1/2 intubated. Tracheostomy 1/4. 2/2 Transferred back to ICU and placed back on vent (2/2-2/5) due to respiratory issues. PMHx: COPD and chronic respiratory failure on 2-3L home O2, depression/anxiety, breast CA, COPD, GERD, schizoaffective disorder    PT Comments    Pt with flat affect, increased time to initiate movement and continued lack of activity tolerance. Pt on 40% trach collar throughout with SPO2 88-98% with limited mobility and cues for breathing technique. Pt with breathing tx and suctioning prior to mobility. Pt educated for HEP and progression but limited by fatigue. Will continue to follow.   HR 121 with standing BP 120/88    Recommendations for follow up therapy are one component of a multi-disciplinary discharge planning process, led by the attending physician.  Recommendations may be updated based on patient status, additional functional criteria and insurance authorization.  Follow Up Recommendations  PT at Long-term acute care hospital Can patient physically be transported by private vehicle: No   Assistance Recommended at Discharge Frequent or constant Supervision/Assistance  Patient can return home with the following A lot of help with bathing/dressing/bathroom;Assist for transportation;A lot of help with walking and/or transfers;Assistance with [facility]   Equipment Recommendations  Hospital bed;Wheelchair (measurements PT);Wheelchair cushion (measurements PT)    Recommendations for Other Services       Precautions / Restrictions Precautions Precautions: Fall Precaution Comments: trach, feeding  tube Restrictions Weight Bearing Restrictions: No     Mobility  Bed Mobility Overal bed mobility: Needs Assistance Bed Mobility: Supine to Sit     Supine to sit: Min assist, HOB elevated     General bed mobility comments: HOB 30 degrees with physical assist to pivot to left and transition to sitting with use of rail    Transfers Overall transfer level: Needs assistance   Transfers: Sit to/from Stand, Bed to chair/wheelchair/BSC Sit to Stand: Min assist   Step pivot transfers: Mod assist, +2 safety/equipment       General transfer comment: pt stood x 2 from bed and x 1 from chair with min assist to power up and cues for hand placment. Pt able to step 3' from bed to chair with RW with mod assist to control balance and direction. Pt denied further trials or attempts at gait    Ambulation/Gait               General Gait Details: unable   Stairs             Wheelchair Mobility    Modified Rankin (Stroke Patients Only)       Balance Overall balance assessment: Needs assistance Sitting-balance support: No upper extremity supported, Feet supported Sitting balance-Leahy Scale: Fair     Standing balance support: Bilateral upper extremity supported, Reliant on assistive device for balance Standing balance-Leahy Scale: Poor Standing balance comment: RW for standing                            Cognition Arousal/Alertness: Awake/alert Behavior During Therapy: Flat affect Overall Cognitive Status: Impaired/Different from baseline Area of Impairment: Orientation, Following commands  Orientation Level: Disoriented to, Time     Following Commands: Follows one step commands inconsistently, Follows one step commands with increased time       General Comments: pt requires assist to initiate and increased time. Pt stating day as Saturday, difficult to read writing and read her lips for communication        Exercises  General Exercises - Lower Extremity Heel Slides: AAROM, Both, 10 reps, Seated    General Comments        Pertinent Vitals/Pain Pain Assessment Pain Assessment: No/denies pain    Home Living                          Prior Function            PT Goals (current goals can now be found in the care plan section) Progress towards PT goals: Progressing toward goals    Frequency    Min 3X/week      PT Plan Current plan remains appropriate    Co-evaluation              AM-PAC PT "6 Clicks" Mobility   Outcome Measure  Help needed turning from your back to your side while in a flat bed without using bedrails?: A Little Help needed moving from lying on your back to sitting on the side of a flat bed without using bedrails?: A Lot Help needed moving to and from a bed to a chair (including a wheelchair)?: A Lot Help needed standing up from a chair using your arms (e.g., wheelchair or bedside chair)?: A Little Help needed to walk in hospital room?: Total Help needed climbing 3-5 steps with a railing? : Total 6 Click Score: 12    End of Session Equipment Utilized During Treatment: Oxygen;Gait belt Activity Tolerance: Patient limited by fatigue Patient left: in chair;with call bell/phone within reach;with chair alarm set Nurse Communication: Mobility status PT Visit Diagnosis: Difficulty in walking, not elsewhere classified (R26.2);Unsteadiness on feet (R26.81);Other abnormalities of gait and mobility (R26.89);Muscle weakness (generalized) (M62.81)     Time: 2585-2778 PT Time Calculation (min) (ACUTE ONLY): 21 min  Charges:  $Therapeutic Activity: 8-22 mins                     Bayard Males, PT Acute Rehabilitation Services Office: [phone]    Lamarr Lulas 07/03/2022, 9:39 AM

## 2022-07-03 NOTE — Progress Notes (Signed)
Pts continues to desat and MD notified. Traid MD okay w/ putting pt back on trach vent.

## 2022-07-03 NOTE — Progress Notes (Signed)
Speech Language Pathology Treatment: [name] Speaking valve  Patient Details Name: Abigail Castillo MRN: [id number] DOB: November 16, 1950 Today's Date: 07/03/2022 Time: 0321-2248 SLP Time Calculation (min) (ACUTE ONLY): 20 min  Assessment / Plan / Recommendation Clinical Impression  Pt seen up in chair. Prior to [place] placement pt trying to communicate, but SLP and RN unable to read pts lips. Writing illegible. Pts O2 sats at baseline around 95. After PMSV placed for less than one minute intervals O2 sats periodically dropped into 80s. Sats increased to 91 with removal. PMSV and talking and up in chair too taxing for respiratory function. Asked RN to only place PMSV for a moment to understand pts speech and then remove it. No POs trialed given poor activity tolerance. Will f/u.   HPI HPI: Abigail Castillo is a 72 y.o. F recently admitted for COPD flare, discharged to SNF who returned 12/25 due to SpO2 65%. Found to be RSV+, pCO2 80, started on BiPAP. Head CT showing possible subacute infarct of the medial left cerebellar hemisphere. Initial bedside swallow eval 1/1 WFL. Failed bipap wean on 1/2, intubated. Tracheostomy 1/4. Pt with COPD and chronic respiratory failure on 2-3L home O2, depression/anxiety and hx BrCA      SLP Plan         Recommendations for follow up therapy are one component of a multi-disciplinary discharge planning process, led by the attending physician.  Recommendations may be updated based on patient status, additional functional criteria and insurance authorization.    Recommendations  Diet recommendations: NPO      Patient may use Passy-Muir Speech Valve: Intermittently with supervision PMSV Supervision: Full         Follow Up Recommendations: SLP at Long-term acute care hospital Assistance recommended at discharge: Frequent or constant Supervision/Assistance SLP Visit Diagnosis: Dysphagia, unspecified (R13.10);[place]ph[place] (R49.1)           Abigail Castillo, Katherene Ponto  07/03/2022, 11:19 AM

## 2022-07-03 NOTE — Progress Notes (Signed)
Pt up in recliner and Spo2 continues to drop to the low 80's. RT notified and per their recommendations increase FiO2 to 100%

## 2022-07-04 ENCOUNTER — Other Ambulatory Visit: Payer: Self-pay | Admitting: Nurse Practitioner

## 2022-07-04 DIAGNOSIS — J449 Chronic obstructive pulmonary disease, unspecified: Secondary | ICD-10-CM | POA: Diagnosis not present

## 2022-07-04 DIAGNOSIS — M62551 Muscle wasting and atrophy, not elsewhere classified, right thigh: Secondary | ICD-10-CM | POA: Diagnosis not present

## 2022-07-04 DIAGNOSIS — I10 Essential (primary) hypertension: Secondary | ICD-10-CM | POA: Diagnosis not present

## 2022-07-04 DIAGNOSIS — R0989 Other specified symptoms and signs involving the circulatory and respiratory systems: Secondary | ICD-10-CM | POA: Diagnosis not present

## 2022-07-04 DIAGNOSIS — I272 Pulmonary hypertension, unspecified: Secondary | ICD-10-CM | POA: Diagnosis not present

## 2022-07-04 DIAGNOSIS — E870 Hyperosmolality and hypernatremia: Secondary | ICD-10-CM | POA: Diagnosis not present

## 2022-07-04 DIAGNOSIS — I63542 Cerebral infarction due to unspecified occlusion or stenosis of left cerebellar artery: Secondary | ICD-10-CM | POA: Diagnosis not present

## 2022-07-04 DIAGNOSIS — Z6824 Body mass index (BMI) 24.0-24.9, adult: Secondary | ICD-10-CM | POA: Diagnosis not present

## 2022-07-04 DIAGNOSIS — L89159 Pressure ulcer of sacral region, unspecified stage: Secondary | ICD-10-CM | POA: Diagnosis not present

## 2022-07-04 DIAGNOSIS — E871 Hypo-osmolality and hyponatremia: Secondary | ICD-10-CM | POA: Diagnosis not present

## 2022-07-04 DIAGNOSIS — R262 Difficulty in walking, not elsewhere classified: Secondary | ICD-10-CM | POA: Diagnosis not present

## 2022-07-04 DIAGNOSIS — R918 Other nonspecific abnormal finding of lung field: Secondary | ICD-10-CM | POA: Diagnosis not present

## 2022-07-04 DIAGNOSIS — J121 Respiratory syncytial virus pneumonia: Secondary | ICD-10-CM | POA: Diagnosis not present

## 2022-07-04 DIAGNOSIS — E43 Unspecified severe protein-calorie malnutrition: Secondary | ICD-10-CM | POA: Diagnosis not present

## 2022-07-04 DIAGNOSIS — J9622 Acute and chronic respiratory failure with hypercapnia: Secondary | ICD-10-CM | POA: Diagnosis not present

## 2022-07-04 DIAGNOSIS — Z9981 Dependence on supplemental oxygen: Secondary | ICD-10-CM | POA: Diagnosis not present

## 2022-07-04 DIAGNOSIS — Z853 Personal history of malignant neoplasm of breast: Secondary | ICD-10-CM | POA: Diagnosis not present

## 2022-07-04 DIAGNOSIS — D638 Anemia in other chronic diseases classified elsewhere: Secondary | ICD-10-CM | POA: Diagnosis not present

## 2022-07-04 DIAGNOSIS — Z87891 Personal history of nicotine dependence: Secondary | ICD-10-CM | POA: Diagnosis not present

## 2022-07-04 DIAGNOSIS — E46 Unspecified protein-calorie malnutrition: Secondary | ICD-10-CM | POA: Diagnosis not present

## 2022-07-04 DIAGNOSIS — E440 Moderate protein-calorie malnutrition: Secondary | ICD-10-CM | POA: Diagnosis not present

## 2022-07-04 DIAGNOSIS — R0602 Shortness of breath: Secondary | ICD-10-CM | POA: Diagnosis present

## 2022-07-04 DIAGNOSIS — J9621 Acute and chronic respiratory failure with hypoxia: Secondary | ICD-10-CM | POA: Diagnosis not present

## 2022-07-04 DIAGNOSIS — J159 Unspecified bacterial pneumonia: Secondary | ICD-10-CM | POA: Diagnosis not present

## 2022-07-04 DIAGNOSIS — J398 Other specified diseases of upper respiratory tract: Secondary | ICD-10-CM | POA: Diagnosis not present

## 2022-07-04 DIAGNOSIS — F259 Schizoaffective disorder, unspecified: Secondary | ICD-10-CM | POA: Diagnosis not present

## 2022-07-04 DIAGNOSIS — E1165 Type 2 diabetes mellitus with hyperglycemia: Secondary | ICD-10-CM | POA: Diagnosis not present

## 2022-07-04 DIAGNOSIS — M62552 Muscle wasting and atrophy, not elsewhere classified, left thigh: Secondary | ICD-10-CM | POA: Diagnosis not present

## 2022-07-04 DIAGNOSIS — K760 Fatty (change of) liver, not elsewhere classified: Secondary | ICD-10-CM | POA: Diagnosis not present

## 2022-07-04 DIAGNOSIS — K219 Gastro-esophageal reflux disease without esophagitis: Secondary | ICD-10-CM | POA: Diagnosis not present

## 2022-07-04 DIAGNOSIS — R29701 NIHSS score 1: Secondary | ICD-10-CM | POA: Diagnosis not present

## 2022-07-04 DIAGNOSIS — Z9911 Dependence on respirator [ventilator] status: Secondary | ICD-10-CM | POA: Diagnosis not present

## 2022-07-04 DIAGNOSIS — R5381 Other malaise: Secondary | ICD-10-CM | POA: Diagnosis not present

## 2022-07-04 DIAGNOSIS — Z1152 Encounter for screening for COVID-19: Secondary | ICD-10-CM | POA: Diagnosis not present

## 2022-07-04 DIAGNOSIS — F411 Generalized anxiety disorder: Secondary | ICD-10-CM | POA: Diagnosis not present

## 2022-07-04 DIAGNOSIS — Z430 Encounter for attention to tracheostomy: Secondary | ICD-10-CM | POA: Diagnosis not present

## 2022-07-04 DIAGNOSIS — E876 Hypokalemia: Secondary | ICD-10-CM | POA: Diagnosis not present

## 2022-07-04 DIAGNOSIS — J189 Pneumonia, unspecified organism: Secondary | ICD-10-CM | POA: Diagnosis not present

## 2022-07-04 DIAGNOSIS — F419 Anxiety disorder, unspecified: Secondary | ICD-10-CM | POA: Diagnosis not present

## 2022-07-04 DIAGNOSIS — F32A Depression, unspecified: Secondary | ICD-10-CM | POA: Diagnosis not present

## 2022-07-04 DIAGNOSIS — D0512 Intraductal carcinoma in situ of left breast: Secondary | ICD-10-CM | POA: Diagnosis not present

## 2022-07-04 DIAGNOSIS — J441 Chronic obstructive pulmonary disease with (acute) exacerbation: Secondary | ICD-10-CM | POA: Diagnosis not present

## 2022-07-04 DIAGNOSIS — R000 Tachycardia, unspecified: Secondary | ICD-10-CM | POA: Diagnosis not present

## 2022-07-04 DIAGNOSIS — J440 Chronic obstructive pulmonary disease with acute lower respiratory infection: Secondary | ICD-10-CM | POA: Diagnosis not present

## 2022-07-04 LAB — COMPREHENSIVE METABOLIC PANEL
ALT: 21 U/L (ref 0–44)
AST: 18 U/L (ref 15–41)
Albumin: 2.5 g/dL — ABNORMAL LOW (ref 3.5–5.0)
Alkaline Phosphatase: 89 U/L (ref 38–126)
Anion gap: 11 (ref 5–15)
BUN: 28 mg/dL — ABNORMAL HIGH (ref 8–23)
CO2: 38 mmol/L — ABNORMAL HIGH (ref 22–32)
Calcium: 8.7 mg/dL — ABNORMAL LOW (ref 8.9–10.3)
Chloride: 90 mmol/L — ABNORMAL LOW (ref 98–111)
Creatinine, Ser: 0.41 mg/dL — ABNORMAL LOW (ref 0.44–1.00)
GFR, Estimated: >60 mL/min (ref >60)
Glucose, Bld: 205 mg/dL — ABNORMAL HIGH (ref 70–99)
Potassium: 3.6 mmol/L (ref 3.5–5.1)
Sodium: 139 mmol/L (ref 135–145)
Total Bilirubin: 1 mg/dL (ref 0.3–1.2)
Total Protein: 4.9 g/dL — ABNORMAL LOW (ref 6.5–8.1)

## 2022-07-04 LAB — GLUCOSE, CAPILLARY
Glucose-Capillary: 168 mg/dL — ABNORMAL HIGH (ref 70–99)
Glucose-Capillary: 103 mg/dL — ABNORMAL HIGH (ref 70–99)
Glucose-Capillary: 141 mg/dL — ABNORMAL HIGH (ref 70–99)
Glucose-Capillary: 156 mg/dL — ABNORMAL HIGH (ref 70–99)

## 2022-07-04 LAB — CBC
HCT: 28.3 % — ABNORMAL LOW (ref 36.0–46.0)
Hemoglobin: 8.6 g/dL — ABNORMAL LOW (ref 12.0–15.0)
MCH: 31.2 pg (ref 26.0–34.0)
MCHC: 30.4 g/dL (ref 30.0–36.0)
MCV: 102.5 fL — ABNORMAL HIGH (ref 80.0–100.0)
Platelets: 144 10*3/uL — ABNORMAL LOW (ref 150–400)
RBC: 2.76 MIL/uL — ABNORMAL LOW (ref 3.87–5.11)
RDW: 16.7 % — ABNORMAL HIGH (ref 11.5–15.5)
WBC: 6.6 10*3/uL (ref 4.0–10.5)
nRBC: 0 % (ref 0.0–0.2)

## 2022-07-04 LAB — MAGNESIUM: Magnesium: 1.8 mg/dL (ref 1.7–2.4)

## 2022-07-04 MED ORDER — METOPROLOL TARTRATE 25 MG PO TABS: 25.0000 mg | ORAL_TABLET | Freq: Two times a day (BID) | ORAL | Status: DC

## 2022-07-04 MED ORDER — CLONAZEPAM 1 MG PO TBDP: 1.0000 mg | ORAL_TABLET | Freq: Two times a day (BID) | ORAL | 0 refills | Status: DC | PRN

## 2022-07-04 MED ORDER — BUDESONIDE 0.5 MG/2ML IN SUSP: 0.5000 mg | Freq: Two times a day (BID) | RESPIRATORY_TRACT | 0 refills | Status: DC

## 2022-07-04 MED ORDER — FAMOTIDINE 20 MG PO TABS
20.0000 mg | ORAL_TABLET | Freq: Every day | ORAL | Status: DC
  Administered 2022-07-04: 20 mg
  Filled 2022-07-04: qty 1

## 2022-07-04 MED ORDER — ATORVASTATIN CALCIUM 40 MG PO TABS: 40.0000 mg | ORAL_TABLET | Freq: Every day | ORAL | 1 refills | Status: DC

## 2022-07-04 MED ORDER — AZITHROMYCIN 250 MG PO TABS: 250.0000 mg | ORAL_TABLET | Freq: Every day | ORAL | 1 refills | Status: DC

## 2022-07-04 MED ORDER — FAMOTIDINE 20 MG PO TABS: 20.0000 mg | ORAL_TABLET | Freq: Every day | ORAL | 1 refills | Status: DC

## 2022-07-04 MED ORDER — FREE WATER: 200.0000 mL | Freq: Four times a day (QID) | 0 refills | Status: AC

## 2022-07-04 MED ORDER — LORATADINE 10 MG PO TABS: 10.0000 mg | ORAL_TABLET | Freq: Every day | ORAL | 0 refills | Status: DC

## 2022-07-04 MED ORDER — OSMOLITE 1.5 CAL PO LIQD: 237.0000 mL | Freq: Every day | ORAL | 0 refills | Status: AC

## 2022-07-04 MED ORDER — LEVALBUTEROL HCL 0.63 MG/3ML IN NEBU: 0.6300 mg | INHALATION_SOLUTION | RESPIRATORY_TRACT | 12 refills | Status: DC | PRN

## 2022-07-04 MED ORDER — ANASTROZOLE 1 MG PO TABS: 1.0000 mg | ORAL_TABLET | Freq: Every morning | ORAL | 0 refills | Status: DC

## 2022-07-04 MED ORDER — BUSPIRONE HCL 5 MG PO TABS: 5.0000 mg | ORAL_TABLET | Freq: Three times a day (TID) | ORAL | 0 refills | Status: DC

## 2022-07-04 MED ORDER — INSULIN GLARGINE-YFGN 100 UNIT/ML ~~LOC~~ SOLN: 8.0000 [IU] | Freq: Every day | SUBCUTANEOUS | 11 refills | Status: DC

## 2022-07-04 MED ORDER — INSULIN DETEMIR 100 UNIT/ML FLEXPEN: 8.0000 [IU] | PEN_INJECTOR | Freq: Every day | SUBCUTANEOUS | 11 refills | Status: DC

## 2022-07-04 MED ORDER — PROSOURCE TF20 ENFIT COMPATIBL EN LIQD: 60.0000 mL | Freq: Every day | ENTERAL | 0 refills | Status: AC

## 2022-07-04 MED ORDER — PREDNISONE 10 MG PO TABS: 10.0000 mg | ORAL_TABLET | Freq: Every day | ORAL | 1 refills | Status: DC

## 2022-07-04 MED ORDER — ONDANSETRON HCL 4 MG PO TABS: 4.0000 mg | ORAL_TABLET | Freq: Four times a day (QID) | ORAL | 0 refills | Status: DC | PRN

## 2022-07-04 MED ORDER — BANATROL TF EN LIQD: 60.0000 mL | Freq: Three times a day (TID) | ENTERAL | 0 refills | Status: AC

## 2022-07-04 MED ORDER — ADULT MULTIVITAMIN W/MINERALS CH: 1.0000 | ORAL_TABLET | Freq: Every day | ORAL | 1 refills | Status: DC

## 2022-07-04 MED ORDER — GERHARDT'S BUTT CREAM: 1.0000 | TOPICAL_CREAM | CUTANEOUS | 0 refills | Status: AC | PRN

## 2022-07-04 MED ORDER — ASPIRIN 81 MG PO CHEW: 81.0000 mg | CHEWABLE_TABLET | Freq: Every day | ORAL | 2 refills | Status: DC

## 2022-07-04 MED ORDER — POLYETHYLENE GLYCOL 3350 17 G PO PACK: 17.0000 g | PACK | Freq: Every day | ORAL | 0 refills | Status: DC | PRN

## 2022-07-04 MED ORDER — CALCIUM CARBONATE 1250 (500 CA) MG PO TABS: 1.0000 | ORAL_TABLET | Freq: Two times a day (BID) | ORAL | 0 refills | Status: DC

## 2022-07-04 MED ORDER — CYANOCOBALAMIN 1000 MCG PO TABS: 1000.0000 ug | ORAL_TABLET | Freq: Every morning | ORAL | 0 refills | Status: DC

## 2022-07-04 MED ORDER — OLANZAPINE 10 MG PO TABS: 10.0000 mg | ORAL_TABLET | Freq: Every morning | ORAL | 0 refills | Status: DC

## 2022-07-04 NOTE — Progress Notes (Signed)
[facility] called and report given by this RN and pts vitals w/in order set

## 2022-07-04 NOTE — TOC Transition Note (Signed)
Transition of Care [facility]) - CM/SW Discharge Note   Patient Details  Name: Abigail Castillo MRN: [id number] Date of Birth: 1950/06/10  [facility] of [facility]) CM/SW Contact:  Milinda Antis, [place] Phone Number: 07/04/2022, 11:55 AM   Clinical Narrative:    Patient will DC to: [facility] Anticipated DC date: 07/04/2022 Family notified: Yes, daughter Lobbyist by: [facility]  Per MD patient ready for DC to [facility]. RN to call report prior to discharge [phone] or [phone]). Patient will go to room 318 and the receiving MD is Dr. Westley Gambles.  RN, patient, patient's family, and facility notified of DC.  LCSW verified that the facility has discharge summary.  [facility] transport will be requested for patient by RN.   CSW will sign off for now as social work intervention is no longer needed. Please consult [place] again if new needs arise.     Final next level of care: Long Term Acute Care (LTAC) Barriers to Discharge: Barriers Resolved   Patient Goals and CMS Choice CMS Medicare.gov Compare Post Acute Care list provided to:: Patient Represenative (must comment) (Patients daughter Candice) Choice offered to / list presented to : Adult Children (Patients daughter Candice)  Discharge Placement                Patient chooses bed at:  ([facility]) Patient to be transferred to facility by: [facility] Name of family member notified:  Junita Push (Daughter)   [phone]) Patient and family notified of of transfer: 07/04/22  Discharge Plan and Services Additional resources added to the After Visit Summary for   In-house Referral: Clinical Social Work   Post Acute Care Choice: Long Term Acute Care (LTAC)                               Social Determinants of Health (SDOH) Interventions [place]: No Food Insecurity (05/30/2022)  Housing: Low Risk  (05/30/2022)  Transportation Needs: No Transportation Needs (05/30/2022)  Utilities: Not At  Risk (05/30/2022)  Alcohol Screen: Low Risk  (03/26/2021)  Depression (PHQ2-9): Low Risk  (03/26/2021)  Financial Resource Strain: Low Risk  (03/26/2021)  Physical Activity: Inactive (03/26/2021)  Social Connections: Moderately Isolated (03/26/2021)  Stress: No Stress Concern Present (03/26/2021)  Tobacco Use: Medium Risk (06/17/2022)     Readmission Risk Interventions     No data to display

## 2022-07-04 NOTE — Plan of Care (Signed)
Pt stable for transfer

## 2022-07-04 NOTE — Progress Notes (Signed)
[facility]  called and report given to RN Jerilynn Mages

## 2022-07-04 NOTE — Consult Note (Signed)
   [facility] Inpatient Consult   07/04/2022  RAVON MCILHENNY 12-10-1950 [id number]  [place] [facility] [ACO] Patient: [employer] Medicare  Primary Care Provider:  [name], NP, with [facility]  Follow up:  LLOS - 46 days transition  Patient screened for post hospitalization transitions. Review of patient's electronic medical record reveals patient is transitioning to [facility]   Plan:  No [facility] Community Coordination needed, patient's post hospital will be managed at a LTACH level of care.  Sign off  Of note, [facility] Care Management/Population Health does not replace or interfere with any arrangements made by the Inpatient Transition of Care team.  For questions contact:   Natividad Brood, RN BSN [place]  [phone] business mobile phone Toll free office [phone]  *[place]  [phone] Fax number: [phone] [email or web address]

## 2022-07-04 NOTE — Progress Notes (Deleted)
PROGRESS NOTE    Abigail Castillo  [id number] DOB: 1950-09-12 DOA: 05/19/2022 PCP: [name], NP   Brief Narrative:  Abigail Castillo is a 72 y.o. female who has a PMH as below including but not limited to COPD on chronic 2-3L O2 and 16m Prednisone, OSA on nocturnal BiPAP, and who is followed by Dr. BLamonte Sakaipulmonologist. She had recent admission 11/15 through 12/18 for acute COPD exacerbation and [facility] and hypoxia down to 65%. She was found to have hypoxic and hypercapnic respiratory failure and her RSV swab returned positive. She was placed on BiPAP [facility] Unfortunately on 12[facility], PCCM was consulted for assistance with BiPAP and COPD management. Failed to wean BiPAP and ultimately required intubation 05/27/22. Tracheostomy placed 1/4. Throughout stay tolerated TC for periods, however requiring periods of vent. LTAC initially denied. 1/23 PEG placed. 1/29 tolerating trach collar well. Transitioned out of ICU. Repeat episode of profound hypoxia on 06/27/22 transferred back to ICU and placed on Vent. Trach exchanged.   Significant hospital events 12/25 admit. 12/26 PCCM consult. 1/2 Failed BiPAP wean, Intubated 1/3 CT Head negative was completed for observed pupillary change 1/4 Tracheostomy placed 1/8 CT head for anisocoria; neg,  1/11 continuing progressive trach collar trials 1/12 BID trach collar trials, appears comfortable  1/14 failed PSV 1/15 pulse prednisone at 40 mg continues to fail SBT trials LTAC denied awaiting vent SNF 1/16 tolerated 1 hr atc. ASA placed on hold at request of IR for PEG.  1/23 PEG placement 1/25 placed back on full support for fatigue/ increased WOB after ATC x 25hrs, softer Bps 1/27 was able to tolerate trach collar for better part of prior day.  Rested at nighttime.  Stood with assistance 1/29 AM, indefinite trach collar 2/2 Back to ICU requiring vent. Trach  Exchanged due to cuff leak.  06/30/2022 >> [facility], PCCM for vent management only  Assessment & Plan:   Principal Problem:   Acute on chronic respiratory failure with hypoxia and hypercapnia (HCC) Active Problems:   COPD with acute exacerbation (HCC)   RSV (respiratory syncytial virus pneumonia)   Depression   Ductal carcinoma in situ (DCIS) of left breast   Hyponatremia   Essential hypertension   Malnutrition of moderate degree   Acute respiratory failure with hypercapnia (HCC)   Pressure injury of skin   Tracheostomy in place ([facility]  Acute on chronic respiratory failure with hypoxia and hypercapnia (HCC) - Continue on trach collar, wean oxygen as tolerated - PCCM following trach - appreciate insight and recommendations - Brovana 15 mcg bid  - Pulmicort 0.5 mg bid  - Azithromycin 250 mg PO daily - this will be a chronic medication moving forward - Prednisone 10 mg PO daily - this will likely be a chronic medication moving forward - Xopenex q3 hr PRN  - Claritin 10 mg daily  - Yupelri 175 mcg daily  - Versed 1 mg q2 hr PRN  - Case manager working on [facility]   [facility] ([facility] - Management as above   Malnutrition of moderate degree - Osmolite 1.5 cal - PROsource 60 mL daily  - Banatrol TF 60 mL tid  - MVI daily    Essential hypertension - Lopressor 25 mg bid    Hyponatremia - Resolved    Ductal carcinoma in situ (DCIS) of left breast - Arimidex 1 mg daily  Depression - Buspar 5 mg tid  - Olanzapine 10 mg daily  - Clonazepam 1 mg bid PRN  and 1 mg bedtime    L frontal CVA - ASA 81 mg daily  - Lipitor 40 mg daily    Prediabetes - A1c 6.1  - Novolog SS q4 hr - Semglee 8 units sq daily   RSV (respiratory syncytial virus pneumonia) - Resolved (tested positive 05/19/22)  DVT prophylaxis: Lovenox GI prophylaxis: Pantoprazole Code Status: Full Family Communication: None present  Status is: Inpt  Dispo: The  patient is from: SNF              Anticipated d/c is to: SNF vs LTAC (depending clinical course)              Anticipated d/c date is: TBD              Patient currently IS medically stable for discharge  Consultants:  PCCM  Antimicrobials:  Azithromycin, chronic   Subjective: No acute issues or events overnight review of systems somewhat limited by patient's poor verbal status but denies pain nausea vomiting diarrhea constipation headache fevers or chills.  Objective: Vitals:   07/04/22 0400 07/04/22 0500 07/04/22 0600 07/04/22 0710  BP: (!) 102/56 (!) 97/52 121/81   Pulse: (!) 103 (!) 102 (!) 112   Resp: 18 19 (!) 23 (!) 26  Temp:      TempSrc:      SpO2: 98% 99% 98% 96%  Weight:  60.5 kg    Height:        Intake/Output Summary (Last 24 hours) at 07/04/2022 0724 Last data filed at 07/04/2022 0600 Gross per 24 hour  Intake 1596 ml  Output 725 ml  Net 871 ml   Filed Weights   07/02/22 0325 07/03/22 0500 07/04/22 0500  Weight: 59.2 kg 60.7 kg 60.5 kg    Examination:  General:  Pleasantly resting in bed, No acute distress. Neck: Tracheostomy in place, trach collar ongoing Lungs: Bilateral wheeze without overt rhonchi or rales. Heart:  Regular rate and rhythm.  Without murmurs, rubs, or gallops. Abdomen: PEG tube noted, clean dry intact, soft nondistended nontender. Extremities: Without cyanosis, clubbing, edema, or obvious deformity.  Data Reviewed: I have personally reviewed following labs and imaging studies  CBC: Recent Labs  Lab 06/28/22 0112 06/29/22 0121 06/30/22 0124 06/30/22 1102 07/01/22 0338 07/02/22 0338 07/02/22 0344 07/03/22 0056 07/04/22 0028  WBC 7.6 7.7 9.4  --  8.3  --  8.6 7.3 6.6  NEUTROABS 5.3 5.9 7.2  --  6.5  --   --   --   --   HGB 10.7* 10.0* 10.0*   < > 9.4* 8.8* 9.3* 9.8* 8.6*  HCT 34.2* 32.4* 33.4*   < > 32.3* 26.0* 30.3* 31.4* 28.3*  MCV 99.7 101.9* 102.8*  --  104.2*  --  102.4* 102.6* 102.5*  PLT 177 149* 154  --  142*  --   136* 148* 144*   < > = values in this interval not displayed.   Basic Metabolic Panel: Recent Labs  Lab 06/30/22 0124 06/30/22 1102 07/01/22 0338 07/02/22 0338 07/02/22 0344 07/03/22 0056 07/04/22 0028  NA 149*   < > 146* 143 142 141 139  K 4.2   < > 3.9 3.8 3.6 3.8 3.6  CL 101  --  100  --  95* 90* 90*  CO2 38*  --  35*  --  38* 45* 38*  GLUCOSE 106*  --  159*  --  107* 150* 205*  BUN 27*  --  31*  --  26* 25* 28*  CREATININE 0.47  --  0.48  --  0.47 0.46 0.41*  CALCIUM 9.6  --  9.2  --  8.9 9.1 8.7*  MG 2.1  --  2.0  --  1.9 2.0 1.8  PHOS 4.5  --  3.1  --  3.8  --   --    < > = values in this interval not displayed.   GFR: Estimated Creatinine Clearance: 56.1 mL/min (A) (by C-G formula based on SCr of 0.41 mg/dL (L)). Liver Function Tests: Recent Labs  Lab 07/02/22 0344 07/03/22 0056 07/04/22 0028  AST 19 25 18  $ ALT 22 22 21  $ ALKPHOS 86 95 89  BILITOT 1.3* 1.4* 1.0  PROT 5.5* 5.5* 4.9*  ALBUMIN 2.7* 2.7* 2.5*   CBG: Recent Labs  Lab 07/03/22 0329 07/03/22 0737 07/03/22 1143 07/03/22 1559 07/03/22 1939  GLUCAP 100* 161* 267* 271* 135*   No results found for this or any previous visit (from the past 240 hour(s)).   Radiology Studies: No results found.  Scheduled Meds:  anastrozole  1 mg Per Tube q AM   arformoterol  15 mcg Nebulization BID   aspirin  81 mg Per Tube Daily   atorvastatin  40 mg Per Tube Daily   azithromycin  250 mg Per Tube Daily   budesonide (PULMICORT) nebulizer solution  0.5 mg Nebulization BID   busPIRone  5 mg Per Tube TID   calcium carbonate  1 tablet Per Tube BID WC   Chlorhexidine Gluconate Cloth  6 each Topical Q0600   clonazePAM  1 mg Per Tube QHS   cyanocobalamin  1,000 mcg Per Tube q AM   enoxaparin (LOVENOX) injection  40 mg Subcutaneous Q24H   famotidine  20 mg Per Tube Daily   feeding supplement (OSMOLITE 1.5 CAL)  237 mL Per Tube 5 X Daily   feeding supplement (PROSource TF20)  60 mL Per Tube Daily   fiber  supplement (BANATROL TF)  60 mL Per Tube TID   free water  200 mL Per Tube Q6H   Gerhardt's butt cream   Topical TID   insulin aspart  0-6 Units Subcutaneous Q4H   insulin glargine-yfgn  8 Units Subcutaneous Daily   loratadine  10 mg Per Tube Daily   metoprolol tartrate  25 mg Per Tube BID   multivitamin with minerals  1 tablet Per Tube Daily   OLANZapine  10 mg Per Tube q AM   mouth rinse  15 mL Mouth Rinse Q2H   predniSONE  10 mg Per Tube Q breakfast   revefenacin  175 mcg Nebulization Daily   sodium chloride flush  3 mL Intravenous Q12H   Continuous Infusions:  sodium chloride Stopped (06/16/22 2148)     LOS: 46 days   Time spent: 35mn  Seher Schlagel C Marene Gilliam, DO [facility]  If 7PM-7AM, please contact night-coverage [email or web address]  07/04/2022, 7:24 AM

## 2022-07-04 NOTE — Discharge Summary (Signed)
Physician Discharge Summary  Abigail Castillo [id number] DOB: 1950-06-02 DOA: 05/19/2022  PCP: [name], NP  Admit date: 05/19/2022 Discharge date: 07/04/2022  Admitted From: SNF Disposition: LTAC  Recommendations for Outpatient Follow-up:  Follow up with PCP in 1-2 weeks Follow-up with pulmonology for ongoing management for respiratory status, tracheostomy care and evaluation for chronic COPD  Discharge Condition: Stable CODE STATUS: Full Diet recommendation: Tube feeds/free water/supplementation as below  Brief/Interim Summary: Abigail Castillo is a 72 y.o. female who has a PMH as below including but not limited to COPD on chronic 2-3L O2 and 68m Prednisone, OSA on nocturnal BiPAP, and who is followed by Dr. BLamonte Sakaipulmonologist. She had recent admission 11/15 through 12/18 for acute COPD exacerbation [facility] dyspnea and hypoxia down to 65%. She was found to have hypoxic and hypercapnic respiratory failure and her RSV swab returned positive. [facility] was placed on BiPAP [facility] on 12[facility]26, PCCM was consulted for assistance with BiPAP and COPD management. Failed to wean BiPAP and ultimately required intubation 05/27/22. Tracheostomy placed 1/4. Throughout stay tolerated TC for periods, however requiring periods of vent. LTAC initially denied. 1/23 PEG placed. 1/29 tolerating trach collar well. Transitioned out of ICU. Repeat episode of profound hypoxia on 06/27/22 transferred back to ICU and placed on Vent. Trach exchanged.    Significant hospital events 12/25 admit. 12/26 PCCM consult. 1/2 Failed BiPAP wean, Intubated 1/3 CT Head negative was completed for observed pupillary change 1/4 Tracheostomy placed 1/8 CT head for anisocoria; neg,  1/11 continuing progressive trach collar trials 1/12 BID trach collar trials, appears comfortable  1/14 failed PSV 1/15 pulse prednisone at 40 mg  continues to fail SBT trials LTAC denied awaiting vent SNF 1/16 tolerated 1 hr atc. ASA placed on hold at request of IR for PEG.  1/23 PEG placement 1/25 placed back on full support for fatigue/ increased WOB after ATC x 25hrs, softer Bps 1/27 was able to tolerate trach collar for better part of prior day.  Rested at nighttime.  Stood with assistance 1/29 AM, indefinite trach collar 2/2 Back to ICU requiring vent. Trach Exchanged due to cuff leak.  06/30/2022 >> [facility], PCCM for vent management only - medically stable to return to LTAC  Discharge Diagnoses:  Principal Problem:   Acute on chronic respiratory failure with hypoxia and hypercapnia ([facility] Active Problems:   COPD with acute exacerbation (HCC)   RSV (respiratory syncytial virus pneumonia)   Depression   Ductal carcinoma in situ (DCIS) of left breast   Hyponatremia   Essential hypertension   Malnutrition of moderate degree   Acute respiratory failure with hypercapnia (HCC)   Pressure injury of skin   Tracheostomy in place ([facility]  Acute on chronic respiratory failure with hypoxia and hypercapnia (HCC) - Continue on trach collar, wean oxygen as tolerated - PCCM following trach - appreciate insight and recommendations - Brovana 15 mcg bid  - Pulmicort 0.5 mg bid  - Azithromycin 250 mg PO daily - this will be a chronic medication moving forward - Prednisone 10 mg PO daily - this will likely be a chronic medication moving forward - Xopenex q3 hr PRN  - Claritin 10 mg daily  - Yupelri 175 mcg daily  - Versed 1 mg q2 hr PRN  - Case manager working on [facility]   COPD with acute exacerbation ([place] - Management as above  Malnutrition of moderate degree - Osmolite 1.5 cal - PROsource 60 mL daily  - Banatrol TF 60 mL tid  - MVI daily    Essential hypertension - Lopressor 25 mg bid    Hyponatremia - Resolved    Ductal carcinoma in situ (DCIS) of left breast - Arimidex 1 mg daily     Depression - Buspar 5 mg tid  - Olanzapine 10 mg daily  - Clonazepam 1 mg bid PRN  and 1 mg bedtime    L frontal CVA - ASA 81 mg daily  - Lipitor 40 mg daily    Prediabetes - A1c 6.1  - Novolog SS q4 hr - Semglee 8 units sq daily    RSV (respiratory syncytial virus pneumonia) - Resolved (tested positive 05/19/22)  Discharge Instructions   Allergies as of 07/04/2022       Reactions   Sulfonamide Derivatives Hives        Medication List     STOP taking these medications    albuterol (2.5 MG/3ML) 0.083% nebulizer solution Commonly known as: PROVENTIL   albuterol 108 (90 Base) MCG/ACT inhaler Commonly known as: VENTOLIN HFA   amLODipine 2.5 MG tablet Commonly known as: NORVASC   Calcium Carbonate-Vitamin D 600-400 MG-UNIT tablet   dexlansoprazole 60 MG capsule Commonly known as: DEXILANT   ENEMA DISPOSABLE RE   furosemide 20 MG tablet Commonly known as: LASIX   ipratropium-albuterol 0.5-2.5 (3) MG/3ML Soln Commonly known as: DUONEB   LORazepam 1 MG tablet Commonly known as: ATIVAN   MILK OF MAGNESIA PO   OXYGEN       TAKE these medications    AeroChamber MV inhaler Use as instructed   anastrozole 1 MG tablet Commonly known as: ARIMIDEX Place 1 tablet (1 mg total) into feeding tube in the morning. Start taking on: July 05, 2022 What changed:  how to take this when to take this   arformoterol 15 MCG/2ML Nebu Commonly known as: BROVANA Take 2 mLs (15 mcg total) by nebulization 2 (two) times daily. What changed: additional instructions   aspirin 81 MG chewable tablet Place 1 tablet (81 mg total) into feeding tube daily. Start taking on: July 05, 2022   atorvastatin 40 MG tablet Commonly known as: LIPITOR Place 1 tablet (40 mg total) into feeding tube daily. Start taking on: July 05, 2022   AVENOVA EX Place 3 sprays into both eyes 2 (two) times daily. (0900, 1700)   azithromycin 250 MG tablet Commonly known as:  ZITHROMAX Place 1 tablet (250 mg total) into feeding tube daily. Start taking on: July 05, 2022 What changed:  medication strength how much to take how to take this when to take this   bisacodyl 10 MG suppository Commonly known as: DULCOLAX Place 10 mg rectally daily as needed (constipation not relieved by Milk of Magnesia).   budesonide 0.5 MG/2ML nebulizer solution Commonly known as: PULMICORT Take 2 mLs (0.5 mg total) by nebulization 2 (two) times daily.   busPIRone 5 MG tablet Commonly known as: BUSPAR Place 1 tablet (5 mg total) into feeding tube 3 (three) times daily.   calcium carbonate 1250 (500 Ca) MG tablet Commonly known as: OS-CAL - dosed in mg of elemental calcium Place 1 tablet (1,250 mg total) into feeding tube 2 (two) times daily with a meal.   cholecalciferol 25 MCG (1000 UNIT) tablet Commonly known as: VITAMIN D3 Take 3,000 Units by mouth in the morning. (0900)   clonazePAM 1 MG disintegrating tablet Commonly known as:  KLONOPIN Place 1 tablet (1 mg total) into feeding tube 2 (two) times daily as needed (anxiety).   cyanocobalamin 1000 MCG tablet Place 1 tablet (1,000 mcg total) into feeding tube in the morning. Start taking on: July 05, 2022 What changed:  how much to take how to take this when to take this   famotidine 20 MG tablet Commonly known as: PEPCID Place 1 tablet (20 mg total) into feeding tube daily. Start taking on: July 05, 2022   feeding supplement (OSMOLITE 1.5 CAL) Liqd Place 237 mLs into feeding tube 5 (five) times daily.   feeding supplement (PROSource TF20) liquid Place 60 mLs into feeding tube daily. Start taking on: July 05, 2022   fiber supplement (BANATROL TF) liquid Place 60 mLs into feeding tube 3 (three) times daily.   free water Soln Place 200 mLs into feeding tube every 6 (six) hours.   Gerhardt's butt cream Crea Apply 1 Application topically as needed for irritation.   insulin glargine-yfgn  100 UNIT/ML injection Commonly known as: SEMGLEE Inject 0.08 mLs (8 Units total) into the skin daily. Start taking on: July 05, 2022   levalbuterol 0.63 MG/3ML nebulizer solution Commonly known as: XOPENEX Take 3 mLs (0.63 mg total) by nebulization every 3 (three) hours as needed for wheezing or shortness of breath.   loratadine 10 MG tablet Commonly known as: CLARITIN Place 1 tablet (10 mg total) into feeding tube daily. Start taking on: July 05, 2022 What changed: how to take this   metoprolol tartrate 25 MG tablet Commonly known as: LOPRESSOR Place 1 tablet (25 mg total) into feeding tube 2 (two) times daily.   multivitamin with minerals Tabs tablet Place 1 tablet into feeding tube daily. Start taking on: July 05, 2022   OLANZapine 10 MG tablet Commonly known as: ZYPREXA Place 1 tablet (10 mg total) into feeding tube in the morning. Start taking on: July 05, 2022 What changed:  medication strength how much to take how to take this additional instructions   ondansetron 4 MG tablet Commonly known as: ZOFRAN Take 1 tablet (4 mg total) by mouth every 6 (six) hours as needed for nausea.   polyethylene glycol 17 g packet Commonly known as: MIRALAX / GLYCOLAX Place 17 g into feeding tube daily as needed for mild constipation.   predniSONE 10 MG tablet Commonly known as: DELTASONE Place 1 tablet (10 mg total) into feeding tube daily with breakfast. Start taking on: July 05, 2022 What changed:  how much to take how to take this when to take this additional instructions   revefenacin 175 MCG/3ML nebulizer solution Commonly known as: YUPELRI Take 3 mLs (175 mcg total) by nebulization daily. What changed:  when to take this additional instructions   vitamin E 180 MG (400 UNITS) capsule Take 400 Units by mouth in the morning. (0900)        Allergies  Allergen Reactions   Sulfonamide Derivatives Hives    Consultations: PCCM,  neuro  Procedures/Studies: DG Chest Port 1 View  Result Date: 06/28/2022 CLINICAL DATA:  Acute on chronic respiratory failure EXAM: PORTABLE CHEST 1 VIEW COMPARISON:  06/27/2022 FINDINGS: Hyperinflation with apical lucency. Interstitial coarsening at the bases. Findings are chronic and attributed to advanced emphysema. Postoperative left chest wall with clips. Metallic density over the right base is in the anterior chest wall by CT. Normal heart size and mediastinal contours. Tracheostomy tube. IMPRESSION: 1. Advanced emphysema.  No acute finding. 2. Located tracheostomy tube. Electronically Signed   By:  Jorje Guild M.D.   On: 06/28/2022 08:50   DG Chest Port 1 View  Result Date: 06/27/2022 CLINICAL DATA:  Shortness of breath EXAM: PORTABLE CHEST 1 VIEW COMPARISON:  06/19/2022, 06/26/22 chest x-ray FINDINGS: Tracheostomy in place. Surgical clips over the left hemithorax. No pleural effusion. No pneumothorax. Redemonstrated severe biapical emphysema. Unchanged nodular opacity in the right mid lung. Unchanged cardiac and mediastinal contours. No radiographically apparent displaced rib fractures. Unchanged linear metallic density projecting over the right subdiaphragmatic region. IMPRESSION: 1. Unchanged nodular opacity in the right mid lung. Recommend further evaluation with chest CT. 2. Severe biapical emphysema. Electronically Signed   By: Marin Roberts M.D.   On: 06/27/2022 12:28   CT HEAD WO CONTRAST (5MM)  Result Date: 06/27/2022 CLINICAL DATA:  Stroke follow-up EXAM: CT HEAD WITHOUT CONTRAST TECHNIQUE: Contiguous axial images were obtained from the base of the skull through the vertex without intravenous contrast. RADIATION DOSE REDUCTION: This exam was performed according to the departmental dose-optimization program which includes automated exposure control, adjustment of the mA and/or kV according to patient size and/or use of iterative reconstruction technique. COMPARISON:  06/02/2022 FINDINGS:  Brain: Unchanged appearance of left frontal infarct. There is periventricular hypoattenuation compatible with chronic microvascular disease. Mild generalized volume loss. No hemorrhage or extra-axial collection. Vascular: No hyperdense vessel or unexpected calcification. Skull: Normal. Negative for fracture or focal lesion. Sinuses/Orbits: No acute finding. Other: None. IMPRESSION: Unchanged appearance of left frontal infarct.  No acute findings. Electronically Signed   By: Ulyses Jarred M.D.   On: 06/27/2022 01:29   DG Chest Port 1 View  Result Date: 06/26/2022 CLINICAL DATA:  Provided history: Shortness of breath. EXAM: PORTABLE CHEST 1 VIEW COMPARISON:  Prior chest radiographs 06/19/2022 and earlier. Chest CT 05/31/2022. FINDINGS: Tracheostomy tube in place. The cardiomediastinal silhouette is unchanged. Aortic atherosclerosis. Emphysema. 2.2 cm pulmonary nodular opacity at the level of the mid right lung, new. Interstitial opacities at both lung bases (increased on the right, similar on the left) as compared to the prior exam. No evidence of pleural effusion or pneumothorax. No acute bony abnormality identified. IMPRESSION: 1. 2.2 cm nodular pulmonary opacity at the level of the mid right lung, new from the prior examination of 06/19/2022 and likely infectious/inflammatory. 2. Interstitial opacities at both lung bases (increased on the right, similar on the left). This may reflect pulmonary edema or infection. 3.  Aortic Atherosclerosis (ICD10-I70.0). Electronically Signed   By: Kellie Simmering D.O.   On: 06/26/2022 08:42   DG Chest Port 1 View  Result Date: 06/19/2022 CLINICAL DATA:  Respiratory distress. EXAM: PORTABLE CHEST 1 VIEW COMPARISON:  One-view chest x-ray FINDINGS: Tracheostomy tube is in place. Heart size is normal. Atherosclerotic calcifications are present at the aortic arch. Emphysematous changes are again noted in both lungs. Superimposed interstitial pattern over the lung bases is slightly  improved from 1/14, more prominent left than right. No significant airspace consolidation is present. Chronic pleural thickening present at both bases without superimposed effusions. IMPRESSION: 1. Slight improvement in interstitial pattern over the lung bases, more prominent left than right, likely representing improving edema. 2. Emphysema. Electronically Signed   By: San Morelle M.D.   On: 06/19/2022 14:00   IR GASTROSTOMY TUBE MOD SED  Result Date: 06/17/2022 INDICATION: ACUTE ON CHRONIC RESPIRATORY FAILURE, HYPOXIA, DYSPHAGIA EXAM: FLUOROSCOPIC 20 FRENCH PULL-THROUGH GASTROSTOMY Date:  06/17/2022 06/17/2022 10:15 am Radiologist:  M. Daryll Brod, MD Guidance:  FLUOROSCOPIC MEDICATIONS: ANCEF 2 G; Antibiotics were administered within 1 hour  of the procedure. Glucagon 0.5 mg IV ANESTHESIA/SEDATION: Versed 0.5 mg IV; Fentanyl 25 mcg IV Moderate Sedation Time:  10 minutes The patient was continuously monitored during the procedure by the interventional radiology nurse under my direct supervision. CONTRAST:  10 cc-administered into the gastric lumen. FLUOROSCOPY: Fluoroscopy Time: 2 minutes 24 seconds (11 mGy). COMPLICATIONS: None immediate. PROCEDURE: Informed consent was obtained from the patient following explanation of the procedure, risks, benefits and alternatives. The patient understands, agrees and consents for the procedure. All questions were addressed. A time out was performed. Maximal barrier sterile technique utilized including caps, mask, sterile gowns, sterile gloves, large sterile drape, hand hygiene, and betadine prep. The left upper quadrant was sterilely prepped and draped. An oral gastric catheter was inserted into the stomach under fluoroscopy. The existing nasogastric feeding tube was removed. Air was injected into the stomach for insufflation and visualization under fluoroscopy. The air distended stomach was confirmed beneath the anterior abdominal wall in the frontal and lateral  projections. Under sterile conditions and local anesthesia, a 51 gauge trocar needle was utilized to access the stomach percutaneously beneath the left subcostal margin. Needle position was confirmed within the stomach under biplane fluoroscopy. Contrast injection confirmed position also. A single T tack was deployed for gastropexy. Over an Amplatz guide wire, a 9-French sheath was inserted into the stomach. A snare device was utilized to capture the oral gastric catheter. The snare device was pulled retrograde from the stomach up the esophagus and out the oropharynx. The 20-French pull-through gastrostomy was connected to the snare device and pulled antegrade through the oropharynx down the esophagus into the stomach and then through the percutaneous tract external to the patient. The gastrostomy was assembled externally. Contrast injection confirms position in the stomach. Images were obtained for documentation. The patient tolerated procedure well. No immediate complication. IMPRESSION: Fluoroscopic insertion of a 20-French "pull-through" gastrostomy. Electronically Signed   By: Jerilynn Mages.  Shick M.D.   On: 06/17/2022 10:32   DG Abd 1 View  Result Date: 06/14/2022 CLINICAL DATA:  Nasogastric tube placement EXAM: ABDOMEN - 1 VIEW COMPARISON:  06/12/2022 FINDINGS: Nasogastric tube tip overlies the expected distal second portion of the duodenum. Normal abdominal gas pattern. No free intraperitoneal gas. Cholecystectomy clips seen in the right upper quadrant. Pelvis excluded from view. IMPRESSION: 1. Nasogastric tube tip within the distal second portion of the duodenum. Electronically Signed   By: Fidela Salisbury M.D.   On: 06/14/2022 03:12   DG Abd 1 View  Result Date: 06/12/2022 CLINICAL DATA:  Nasogastric tube present. EXAM: ABDOMEN - 1 VIEW COMPARISON:  06/12/2022 at 20:06 p.m. FINDINGS: An enteric tube is present with tip projecting over the right upper quadrant consistent with location in the distal stomach. No  change since prior study. Gas-filled small and large bowel as well as the stomach suggesting ileus. Surgical clips in the right upper quadrant. IMPRESSION: Enteric tube tip projects over the right upper quadrant consistent with location in the distal stomach. Electronically Signed   By: Lucienne Capers M.D.   On: 06/12/2022 22:53   DG Abd Portable 1V  Result Date: 06/12/2022 CLINICAL DATA:  72 year old female enteric tube placement. EXAM: PORTABLE ABDOMEN - 1 VIEW COMPARISON:  05/28/2022. FINDINGS: Portable AP semi upright view at 1406 hours. Previously seen feeding tube has been removed. NG type tube placed into the stomach, side hole at the level of the gastric body. Stable cholecystectomy clips. Negative visible bowel gas. Stable visible lung bases. IMPRESSION: Previously seen feeding tube removed.  NG tube now placed into the stomach, side hole at the level of the gastric body. Electronically Signed   By: Genevie Ann M.D.   On: 06/12/2022 14:15   DG CHEST PORT 1 VIEW  Result Date: 06/08/2022 CLINICAL DATA:  Hypoxia EXAM: PORTABLE CHEST 1 VIEW COMPARISON:  May 31, 2022 FINDINGS: Emphysematous changes in the lungs, particularly the right upper lobe. Small bilateral effusions versus pleural thickening. Bibasilar atelectasis. No other interval changes or acute abnormalities. IMPRESSION: 1. Emphysematous changes in the lungs. 2. Small bilateral pleural effusions versus pleural thickening. 3. Bibasilar opacities favored represent atelectasis. Kerley B lines in the left greater than right lung bases favored represent mild edema. Electronically Signed   By: Dorise Bullion III M.D.   On: 06/08/2022 15:34     Subjective: No acute issues or events overnight   Discharge Exam: Vitals:   07/04/22 1000 07/04/22 1119  BP: (!) 112/101   Pulse: (!) 121   Resp: 20   Temp:  98.4 F (36.9 C)  SpO2: 98%    Vitals:   07/04/22 0900 07/04/22 0935 07/04/22 1000 07/04/22 1119  BP: 132/75 (!) 139/90 (!) 112/101    Pulse: (!) 114 (!) 123 (!) 121   Resp: (!) 21  20   Temp:    98.4 F (36.9 C)  TempSrc:    Oral  SpO2: 100%  98%   Weight:      Height:       General:  Pleasantly resting in bed, No acute distress. Neck: Tracheostomy in place, trach collar ongoing Lungs: Bilateral wheeze without overt rhonchi or rales. Heart:  Regular rate and rhythm.  Without murmurs, rubs, or gallops. Abdomen: PEG tube noted, clean dry intact, soft nondistended nontender. Extremities: Without cyanosis, clubbing, edema, or obvious deformity.    The results of significant diagnostics from this hospitalization (including imaging, microbiology, ancillary and laboratory) are listed below for reference.     Microbiology: No results found for this or any previous visit (from the past 240 hour(s)).   Labs: BNP (last 3 results) Recent Labs    06/27/22 0657 06/28/22 0112 06/29/22 0121  BNP 46.5 29.4 [id number]   Basic Metabolic Panel: Recent Labs  Lab 06/30/22 0124 06/30/22 1102 07/01/22 0338 07/02/22 0338 07/02/22 0344 07/03/22 0056 07/04/22 0028  NA 149*   < > 146* 143 142 141 139  K 4.2   < > 3.9 3.8 3.6 3.8 3.6  CL 101  --  100  --  95* 90* 90*  CO2 38*  --  35*  --  38* 45* 38*  GLUCOSE 106*  --  159*  --  107* 150* 205*  BUN 27*  --  31*  --  26* 25* 28*  CREATININE 0.47  --  0.48  --  0.47 0.46 0.41*  CALCIUM 9.6  --  9.2  --  8.9 9.1 8.7*  MG 2.1  --  2.0  --  1.9 2.0 1.8  PHOS 4.5  --  3.1  --  3.8  --   --    < > = values in this interval not displayed.   Liver Function Tests: Recent Labs  Lab 07/02/22 0344 07/03/22 0056 07/04/22 0028  AST 19 25 18  $ ALT 22 22 21  $ ALKPHOS 86 95 89  BILITOT 1.3* 1.4* 1.0  PROT 5.5* 5.5* 4.9*  ALBUMIN 2.7* 2.7* 2.5*   No results for input(s): "LIPASE", "AMYLASE" in the last 168 hours. No results for input(s): "AMMONIA" in  the last 168 hours. CBC: Recent Labs  Lab 06/28/22 0112 06/29/22 0121 06/30/22 0124 06/30/22 1102 07/01/22 0338  07/02/22 0338 07/02/22 0344 07/03/22 0056 07/04/22 0028  WBC 7.6 7.7 9.4  --  8.3  --  8.6 7.3 6.6  NEUTROABS 5.3 5.9 7.2  --  6.5  --   --   --   --   HGB 10.7* 10.0* 10.0*   < > 9.4* 8.8* 9.3* 9.8* 8.6*  HCT 34.2* 32.4* 33.4*   < > 32.3* 26.0* 30.3* 31.4* 28.3*  MCV 99.7 101.9* 102.8*  --  104.2*  --  102.4* 102.6* 102.5*  PLT 177 149* 154  --  142*  --  136* 148* 144*   < > = values in this interval not displayed.   Cardiac Enzymes: No results for input(s): "CKTOTAL", "CKMB", "CKMBINDEX", "TROPONINI" in the last 168 hours. BNP: Invalid input(s): "POCBNP" CBG: Recent Labs  Lab 07/03/22 1939 07/03/22 2347 07/04/22 0334 07/04/22 0727 07/04/22 1118  GLUCAP 135* 141* 103* 168* 156*   D-Dimer No results for input(s): "DDIMER" in the last 72 hours. Hgb A1c No results for input(s): "HGBA1C" in the last 72 hours. Lipid Profile No results for input(s): "CHOL", "HDL", "LDLCALC", "TRIG", "CHOLHDL", "LDLDIRECT" in the last 72 hours. Thyroid function studies No results for input(s): "TSH", "T4TOTAL", "T3FREE", "THYROIDAB" in the last 72 hours.  Invalid input(s): "FREET3" Anemia work up No results for input(s): "VITAMINB12", "FOLATE", "FERRITIN", "TIBC", "IRON", "RETICCTPCT" in the last 72 hours. Urinalysis    Component Value Date/Time   COLORURINE YELLOW 06/03/2022 1401   APPEARANCEUR CLOUDY (A) 06/03/2022 1401   LABSPEC 1.013 06/03/2022 1401   PHURINE 7.0 06/03/2022 1401   GLUCOSEU NEGATIVE 06/03/2022 1401   GLUCOSEU NEGATIVE 11/11/2017 1027   HGBUR NEGATIVE 06/03/2022 1401   BILIRUBINUR NEGATIVE 06/03/2022 1401   KETONESUR NEGATIVE 06/03/2022 1401   PROTEINUR NEGATIVE 06/03/2022 1401   UROBILINOGEN 0.2 11/11/2017 1027   NITRITE NEGATIVE 06/03/2022 1401   LEUKOCYTESUR NEGATIVE 06/03/2022 1401   Sepsis Labs Recent Labs  Lab 07/01/22 0338 07/02/22 0344 07/03/22 0056 07/04/22 0028  WBC 8.3 8.6 7.3 6.6   Microbiology No results found for this or any previous visit  (from the past 240 hour(s)).   Time coordinating discharge: Over 30 minutes  SIGNED:   Little Ishikawa, DO [facility] 07/04/2022, 11:43 AM Pager   If 7PM-7AM, please contact night-coverage [email or web address]

## 2022-07-05 DIAGNOSIS — J121 Respiratory syncytial virus pneumonia: Secondary | ICD-10-CM

## 2022-07-05 DIAGNOSIS — F411 Generalized anxiety disorder: Secondary | ICD-10-CM

## 2022-07-05 DIAGNOSIS — J441 Chronic obstructive pulmonary disease with (acute) exacerbation: Secondary | ICD-10-CM | POA: Diagnosis not present

## 2022-07-05 DIAGNOSIS — J9621 Acute and chronic respiratory failure with hypoxia: Secondary | ICD-10-CM | POA: Diagnosis not present

## 2022-07-05 DIAGNOSIS — D638 Anemia in other chronic diseases classified elsewhere: Secondary | ICD-10-CM | POA: Diagnosis not present

## 2022-07-05 DIAGNOSIS — E46 Unspecified protein-calorie malnutrition: Secondary | ICD-10-CM | POA: Diagnosis not present

## 2022-07-06 DIAGNOSIS — J9621 Acute and chronic respiratory failure with hypoxia: Secondary | ICD-10-CM | POA: Diagnosis not present

## 2022-07-06 DIAGNOSIS — J121 Respiratory syncytial virus pneumonia: Secondary | ICD-10-CM

## 2022-07-06 DIAGNOSIS — J441 Chronic obstructive pulmonary disease with (acute) exacerbation: Secondary | ICD-10-CM | POA: Diagnosis not present

## 2022-07-06 DIAGNOSIS — F411 Generalized anxiety disorder: Secondary | ICD-10-CM

## 2022-07-07 DIAGNOSIS — J121 Respiratory syncytial virus pneumonia: Secondary | ICD-10-CM

## 2022-07-07 DIAGNOSIS — F411 Generalized anxiety disorder: Secondary | ICD-10-CM

## 2022-07-07 DIAGNOSIS — M62551 Muscle wasting and atrophy, not elsewhere classified, right thigh: Secondary | ICD-10-CM | POA: Diagnosis not present

## 2022-07-07 DIAGNOSIS — F419 Anxiety disorder, unspecified: Secondary | ICD-10-CM

## 2022-07-07 DIAGNOSIS — J189 Pneumonia, unspecified organism: Secondary | ICD-10-CM

## 2022-07-07 DIAGNOSIS — R5381 Other malaise: Secondary | ICD-10-CM | POA: Diagnosis not present

## 2022-07-07 DIAGNOSIS — R262 Difficulty in walking, not elsewhere classified: Secondary | ICD-10-CM | POA: Diagnosis not present

## 2022-07-07 DIAGNOSIS — I10 Essential (primary) hypertension: Secondary | ICD-10-CM

## 2022-07-07 DIAGNOSIS — J9621 Acute and chronic respiratory failure with hypoxia: Secondary | ICD-10-CM | POA: Diagnosis not present

## 2022-07-07 DIAGNOSIS — J441 Chronic obstructive pulmonary disease with (acute) exacerbation: Secondary | ICD-10-CM | POA: Diagnosis not present

## 2022-07-07 DIAGNOSIS — M62552 Muscle wasting and atrophy, not elsewhere classified, left thigh: Secondary | ICD-10-CM | POA: Diagnosis not present

## 2022-07-08 DIAGNOSIS — J9621 Acute and chronic respiratory failure with hypoxia: Secondary | ICD-10-CM | POA: Diagnosis not present

## 2022-07-08 DIAGNOSIS — I10 Essential (primary) hypertension: Secondary | ICD-10-CM | POA: Diagnosis not present

## 2022-07-08 DIAGNOSIS — J441 Chronic obstructive pulmonary disease with (acute) exacerbation: Secondary | ICD-10-CM | POA: Diagnosis not present

## 2022-07-08 DIAGNOSIS — F411 Generalized anxiety disorder: Secondary | ICD-10-CM

## 2022-07-08 DIAGNOSIS — J121 Respiratory syncytial virus pneumonia: Secondary | ICD-10-CM

## 2022-07-09 DIAGNOSIS — J121 Respiratory syncytial virus pneumonia: Secondary | ICD-10-CM

## 2022-07-09 DIAGNOSIS — J441 Chronic obstructive pulmonary disease with (acute) exacerbation: Secondary | ICD-10-CM | POA: Diagnosis not present

## 2022-07-09 DIAGNOSIS — F411 Generalized anxiety disorder: Secondary | ICD-10-CM

## 2022-07-09 DIAGNOSIS — J9621 Acute and chronic respiratory failure with hypoxia: Secondary | ICD-10-CM | POA: Diagnosis not present

## 2022-07-10 ENCOUNTER — Ambulatory Visit: Payer: 59 | Admitting: Emergency Medicine

## 2022-07-10 DIAGNOSIS — R5381 Other malaise: Secondary | ICD-10-CM | POA: Diagnosis not present

## 2022-07-10 DIAGNOSIS — E43 Unspecified severe protein-calorie malnutrition: Secondary | ICD-10-CM | POA: Diagnosis not present

## 2022-07-10 DIAGNOSIS — Z9911 Dependence on respirator [ventilator] status: Secondary | ICD-10-CM | POA: Diagnosis not present

## 2022-07-10 DIAGNOSIS — R0989 Other specified symptoms and signs involving the circulatory and respiratory systems: Secondary | ICD-10-CM | POA: Diagnosis not present

## 2022-07-10 DIAGNOSIS — J9621 Acute and chronic respiratory failure with hypoxia: Secondary | ICD-10-CM | POA: Diagnosis not present

## 2022-07-11 DIAGNOSIS — Z9911 Dependence on respirator [ventilator] status: Secondary | ICD-10-CM | POA: Diagnosis not present

## 2022-07-11 DIAGNOSIS — E43 Unspecified severe protein-calorie malnutrition: Secondary | ICD-10-CM | POA: Diagnosis not present

## 2022-07-11 DIAGNOSIS — R5381 Other malaise: Secondary | ICD-10-CM | POA: Diagnosis not present

## 2022-07-11 DIAGNOSIS — J9621 Acute and chronic respiratory failure with hypoxia: Secondary | ICD-10-CM | POA: Diagnosis not present

## 2022-07-12 DIAGNOSIS — J441 Chronic obstructive pulmonary disease with (acute) exacerbation: Secondary | ICD-10-CM | POA: Diagnosis not present

## 2022-07-12 DIAGNOSIS — J9621 Acute and chronic respiratory failure with hypoxia: Secondary | ICD-10-CM | POA: Diagnosis not present

## 2022-07-12 DIAGNOSIS — J121 Respiratory syncytial virus pneumonia: Secondary | ICD-10-CM

## 2022-07-12 DIAGNOSIS — F411 Generalized anxiety disorder: Secondary | ICD-10-CM

## 2022-07-12 DIAGNOSIS — E43 Unspecified severe protein-calorie malnutrition: Secondary | ICD-10-CM | POA: Diagnosis not present

## 2022-07-12 DIAGNOSIS — R5381 Other malaise: Secondary | ICD-10-CM | POA: Diagnosis not present

## 2022-07-13 DIAGNOSIS — R5381 Other malaise: Secondary | ICD-10-CM | POA: Diagnosis not present

## 2022-07-13 DIAGNOSIS — J441 Chronic obstructive pulmonary disease with (acute) exacerbation: Secondary | ICD-10-CM | POA: Diagnosis not present

## 2022-07-13 DIAGNOSIS — E43 Unspecified severe protein-calorie malnutrition: Secondary | ICD-10-CM | POA: Diagnosis not present

## 2022-07-13 DIAGNOSIS — J121 Respiratory syncytial virus pneumonia: Secondary | ICD-10-CM

## 2022-07-13 DIAGNOSIS — F411 Generalized anxiety disorder: Secondary | ICD-10-CM

## 2022-07-13 DIAGNOSIS — J9621 Acute and chronic respiratory failure with hypoxia: Secondary | ICD-10-CM

## 2022-07-14 DIAGNOSIS — I10 Essential (primary) hypertension: Secondary | ICD-10-CM

## 2022-07-14 DIAGNOSIS — F411 Generalized anxiety disorder: Secondary | ICD-10-CM

## 2022-07-14 DIAGNOSIS — J189 Pneumonia, unspecified organism: Secondary | ICD-10-CM

## 2022-07-14 DIAGNOSIS — J441 Chronic obstructive pulmonary disease with (acute) exacerbation: Secondary | ICD-10-CM | POA: Diagnosis not present

## 2022-07-14 DIAGNOSIS — J121 Respiratory syncytial virus pneumonia: Secondary | ICD-10-CM

## 2022-07-14 DIAGNOSIS — F419 Anxiety disorder, unspecified: Secondary | ICD-10-CM

## 2022-07-14 DIAGNOSIS — J9621 Acute and chronic respiratory failure with hypoxia: Secondary | ICD-10-CM | POA: Diagnosis not present

## 2022-07-15 DIAGNOSIS — J9621 Acute and chronic respiratory failure with hypoxia: Secondary | ICD-10-CM | POA: Diagnosis not present

## 2022-07-15 DIAGNOSIS — F411 Generalized anxiety disorder: Secondary | ICD-10-CM

## 2022-07-15 DIAGNOSIS — J441 Chronic obstructive pulmonary disease with (acute) exacerbation: Secondary | ICD-10-CM | POA: Diagnosis not present

## 2022-07-15 DIAGNOSIS — J121 Respiratory syncytial virus pneumonia: Secondary | ICD-10-CM

## 2022-07-16 DIAGNOSIS — J441 Chronic obstructive pulmonary disease with (acute) exacerbation: Secondary | ICD-10-CM | POA: Diagnosis not present

## 2022-07-16 DIAGNOSIS — F411 Generalized anxiety disorder: Secondary | ICD-10-CM

## 2022-07-16 DIAGNOSIS — J121 Respiratory syncytial virus pneumonia: Secondary | ICD-10-CM

## 2022-07-16 DIAGNOSIS — J9621 Acute and chronic respiratory failure with hypoxia: Secondary | ICD-10-CM | POA: Diagnosis not present

## 2022-07-17 DIAGNOSIS — F411 Generalized anxiety disorder: Secondary | ICD-10-CM

## 2022-07-17 DIAGNOSIS — J121 Respiratory syncytial virus pneumonia: Secondary | ICD-10-CM

## 2022-07-17 DIAGNOSIS — J9621 Acute and chronic respiratory failure with hypoxia: Secondary | ICD-10-CM | POA: Diagnosis not present

## 2022-07-17 DIAGNOSIS — J441 Chronic obstructive pulmonary disease with (acute) exacerbation: Secondary | ICD-10-CM | POA: Diagnosis not present

## 2022-07-18 DIAGNOSIS — J398 Other specified diseases of upper respiratory tract: Secondary | ICD-10-CM | POA: Diagnosis not present

## 2022-07-18 DIAGNOSIS — F411 Generalized anxiety disorder: Secondary | ICD-10-CM

## 2022-07-18 DIAGNOSIS — J121 Respiratory syncytial virus pneumonia: Secondary | ICD-10-CM

## 2022-07-18 DIAGNOSIS — J441 Chronic obstructive pulmonary disease with (acute) exacerbation: Secondary | ICD-10-CM | POA: Diagnosis not present

## 2022-07-18 DIAGNOSIS — J9621 Acute and chronic respiratory failure with hypoxia: Secondary | ICD-10-CM | POA: Diagnosis not present

## 2022-07-20 DIAGNOSIS — Z9981 Dependence on supplemental oxygen: Secondary | ICD-10-CM | POA: Diagnosis not present

## 2022-07-20 DIAGNOSIS — J9621 Acute and chronic respiratory failure with hypoxia: Secondary | ICD-10-CM | POA: Diagnosis not present

## 2022-07-20 DIAGNOSIS — R0989 Other specified symptoms and signs involving the circulatory and respiratory systems: Secondary | ICD-10-CM | POA: Diagnosis not present

## 2022-07-21 DIAGNOSIS — Z9981 Dependence on supplemental oxygen: Secondary | ICD-10-CM | POA: Diagnosis not present

## 2022-07-21 DIAGNOSIS — J9621 Acute and chronic respiratory failure with hypoxia: Secondary | ICD-10-CM | POA: Diagnosis not present

## 2022-07-22 DIAGNOSIS — Z9911 Dependence on respirator [ventilator] status: Secondary | ICD-10-CM | POA: Diagnosis not present

## 2022-07-22 DIAGNOSIS — J9621 Acute and chronic respiratory failure with hypoxia: Secondary | ICD-10-CM | POA: Diagnosis not present

## 2022-07-22 DIAGNOSIS — R0989 Other specified symptoms and signs involving the circulatory and respiratory systems: Secondary | ICD-10-CM | POA: Diagnosis not present

## 2022-07-22 DIAGNOSIS — R918 Other nonspecific abnormal finding of lung field: Secondary | ICD-10-CM | POA: Diagnosis not present

## 2022-07-23 DIAGNOSIS — J449 Chronic obstructive pulmonary disease, unspecified: Secondary | ICD-10-CM | POA: Diagnosis not present

## 2022-07-23 DIAGNOSIS — J9621 Acute and chronic respiratory failure with hypoxia: Secondary | ICD-10-CM | POA: Diagnosis not present

## 2022-07-23 DIAGNOSIS — Z9911 Dependence on respirator [ventilator] status: Secondary | ICD-10-CM | POA: Diagnosis not present

## 2022-07-24 DIAGNOSIS — J9621 Acute and chronic respiratory failure with hypoxia: Secondary | ICD-10-CM | POA: Diagnosis not present

## 2022-07-24 DIAGNOSIS — Z9911 Dependence on respirator [ventilator] status: Secondary | ICD-10-CM | POA: Diagnosis not present

## 2022-07-28 DIAGNOSIS — I10 Essential (primary) hypertension: Secondary | ICD-10-CM

## 2022-07-28 DIAGNOSIS — J189 Pneumonia, unspecified organism: Secondary | ICD-10-CM

## 2022-07-28 DIAGNOSIS — F419 Anxiety disorder, unspecified: Secondary | ICD-10-CM

## 2022-07-31 DIAGNOSIS — J9621 Acute and chronic respiratory failure with hypoxia: Secondary | ICD-10-CM

## 2022-07-31 DIAGNOSIS — J121 Respiratory syncytial virus pneumonia: Secondary | ICD-10-CM

## 2022-07-31 DIAGNOSIS — F411 Generalized anxiety disorder: Secondary | ICD-10-CM

## 2022-07-31 DIAGNOSIS — J441 Chronic obstructive pulmonary disease with (acute) exacerbation: Secondary | ICD-10-CM

## 2022-08-01 ENCOUNTER — Telehealth: Payer: Self-pay | Admitting: Internal Medicine

## 2022-08-01 ENCOUNTER — Telehealth: Payer: Self-pay | Admitting: *Deleted

## 2022-08-01 NOTE — Telephone Encounter (Signed)
Communication received from scheduling:  "I reached out to reschedule appointment for 3/29 and patients daughter stated she is on a vent @ [facility] and she doesn't want to make a reschedule at this time, she wanted me to let [facility] know her condition. thank you ! "   This message will be forwarded to MD for review of communication.

## 2022-08-01 NOTE — Telephone Encounter (Signed)
Reached out to patient to move per [facility] being out of office, patients daughter stated she was on a vent. @ [facility] and she doesn't want to make a reschedule  appointment at this time.

## 2022-08-05 DIAGNOSIS — I10 Essential (primary) hypertension: Secondary | ICD-10-CM

## 2022-08-05 DIAGNOSIS — R5381 Other malaise: Secondary | ICD-10-CM

## 2022-08-05 DIAGNOSIS — J449 Chronic obstructive pulmonary disease, unspecified: Secondary | ICD-10-CM | POA: Diagnosis not present

## 2022-08-05 DIAGNOSIS — D649 Anemia, unspecified: Secondary | ICD-10-CM | POA: Diagnosis not present

## 2022-08-05 DIAGNOSIS — F32A Depression, unspecified: Secondary | ICD-10-CM | POA: Diagnosis not present

## 2022-08-05 DIAGNOSIS — J9621 Acute and chronic respiratory failure with hypoxia: Secondary | ICD-10-CM | POA: Diagnosis not present

## 2022-08-05 DIAGNOSIS — F411 Generalized anxiety disorder: Secondary | ICD-10-CM

## 2022-08-19 DIAGNOSIS — R5381 Other malaise: Secondary | ICD-10-CM

## 2022-08-19 DIAGNOSIS — F32A Depression, unspecified: Secondary | ICD-10-CM | POA: Diagnosis not present

## 2022-08-19 DIAGNOSIS — F411 Generalized anxiety disorder: Secondary | ICD-10-CM | POA: Diagnosis not present

## 2022-08-19 DIAGNOSIS — J9621 Acute and chronic respiratory failure with hypoxia: Secondary | ICD-10-CM

## 2022-08-19 DIAGNOSIS — I10 Essential (primary) hypertension: Secondary | ICD-10-CM | POA: Diagnosis not present

## 2022-08-19 DIAGNOSIS — J449 Chronic obstructive pulmonary disease, unspecified: Secondary | ICD-10-CM

## 2022-08-19 DIAGNOSIS — D649 Anemia, unspecified: Secondary | ICD-10-CM | POA: Diagnosis not present

## 2022-08-22 ENCOUNTER — Ambulatory Visit: Payer: Medicare Other | Admitting: Hematology and Oncology

## 2022-08-26 DIAGNOSIS — R000 Tachycardia, unspecified: Secondary | ICD-10-CM

## 2022-09-02 DIAGNOSIS — D649 Anemia, unspecified: Secondary | ICD-10-CM

## 2022-09-02 DIAGNOSIS — F32A Depression, unspecified: Secondary | ICD-10-CM

## 2022-09-02 DIAGNOSIS — I10 Essential (primary) hypertension: Secondary | ICD-10-CM

## 2022-09-02 DIAGNOSIS — R5381 Other malaise: Secondary | ICD-10-CM

## 2022-09-02 DIAGNOSIS — F411 Generalized anxiety disorder: Secondary | ICD-10-CM

## 2022-09-02 DIAGNOSIS — J9621 Acute and chronic respiratory failure with hypoxia: Secondary | ICD-10-CM

## 2022-09-16 DIAGNOSIS — D649 Anemia, unspecified: Secondary | ICD-10-CM

## 2022-09-16 DIAGNOSIS — R5381 Other malaise: Secondary | ICD-10-CM

## 2022-09-16 DIAGNOSIS — J9621 Acute and chronic respiratory failure with hypoxia: Secondary | ICD-10-CM

## 2022-09-16 DIAGNOSIS — I10 Essential (primary) hypertension: Secondary | ICD-10-CM

## 2022-09-16 DIAGNOSIS — F411 Generalized anxiety disorder: Secondary | ICD-10-CM

## 2022-09-16 DIAGNOSIS — J449 Chronic obstructive pulmonary disease, unspecified: Secondary | ICD-10-CM

## 2022-09-16 DIAGNOSIS — F32A Depression, unspecified: Secondary | ICD-10-CM

## 2022-09-30 DIAGNOSIS — F411 Generalized anxiety disorder: Secondary | ICD-10-CM | POA: Diagnosis not present

## 2022-09-30 DIAGNOSIS — J9621 Acute and chronic respiratory failure with hypoxia: Secondary | ICD-10-CM

## 2022-09-30 DIAGNOSIS — I10 Essential (primary) hypertension: Secondary | ICD-10-CM | POA: Diagnosis not present

## 2022-09-30 DIAGNOSIS — R5381 Other malaise: Secondary | ICD-10-CM

## 2022-09-30 DIAGNOSIS — J449 Chronic obstructive pulmonary disease, unspecified: Secondary | ICD-10-CM

## 2022-09-30 DIAGNOSIS — D649 Anemia, unspecified: Secondary | ICD-10-CM | POA: Diagnosis not present

## 2022-09-30 DIAGNOSIS — F32A Depression, unspecified: Secondary | ICD-10-CM | POA: Diagnosis not present

## 2022-10-14 DIAGNOSIS — R5381 Other malaise: Secondary | ICD-10-CM

## 2022-10-14 DIAGNOSIS — F32A Depression, unspecified: Secondary | ICD-10-CM | POA: Diagnosis not present

## 2022-10-14 DIAGNOSIS — F411 Generalized anxiety disorder: Secondary | ICD-10-CM | POA: Diagnosis not present

## 2022-10-14 DIAGNOSIS — D649 Anemia, unspecified: Secondary | ICD-10-CM | POA: Diagnosis not present

## 2022-10-14 DIAGNOSIS — I10 Essential (primary) hypertension: Secondary | ICD-10-CM | POA: Diagnosis not present

## 2022-10-14 DIAGNOSIS — J9621 Acute and chronic respiratory failure with hypoxia: Secondary | ICD-10-CM

## 2022-10-14 DIAGNOSIS — J449 Chronic obstructive pulmonary disease, unspecified: Secondary | ICD-10-CM

## 2022-10-28 DIAGNOSIS — I10 Essential (primary) hypertension: Secondary | ICD-10-CM

## 2022-10-28 DIAGNOSIS — F32A Depression, unspecified: Secondary | ICD-10-CM

## 2022-10-28 DIAGNOSIS — D649 Anemia, unspecified: Secondary | ICD-10-CM

## 2022-10-28 DIAGNOSIS — J449 Chronic obstructive pulmonary disease, unspecified: Secondary | ICD-10-CM

## 2022-10-28 DIAGNOSIS — F411 Generalized anxiety disorder: Secondary | ICD-10-CM

## 2022-10-28 DIAGNOSIS — R5381 Other malaise: Secondary | ICD-10-CM

## 2022-10-28 DIAGNOSIS — J9621 Acute and chronic respiratory failure with hypoxia: Secondary | ICD-10-CM

## 2022-11-11 DIAGNOSIS — F411 Generalized anxiety disorder: Secondary | ICD-10-CM | POA: Diagnosis not present

## 2022-11-11 DIAGNOSIS — D649 Anemia, unspecified: Secondary | ICD-10-CM | POA: Diagnosis not present

## 2022-11-11 DIAGNOSIS — R5381 Other malaise: Secondary | ICD-10-CM

## 2022-11-11 DIAGNOSIS — J9621 Acute and chronic respiratory failure with hypoxia: Secondary | ICD-10-CM

## 2022-11-11 DIAGNOSIS — J449 Chronic obstructive pulmonary disease, unspecified: Secondary | ICD-10-CM

## 2022-11-11 DIAGNOSIS — I10 Essential (primary) hypertension: Secondary | ICD-10-CM | POA: Diagnosis not present

## 2022-11-11 DIAGNOSIS — F32A Depression, unspecified: Secondary | ICD-10-CM | POA: Diagnosis not present

## 2022-11-25 DIAGNOSIS — R5381 Other malaise: Secondary | ICD-10-CM

## 2022-11-25 DIAGNOSIS — J9621 Acute and chronic respiratory failure with hypoxia: Secondary | ICD-10-CM

## 2022-11-25 DIAGNOSIS — D649 Anemia, unspecified: Secondary | ICD-10-CM

## 2022-11-25 DIAGNOSIS — J449 Chronic obstructive pulmonary disease, unspecified: Secondary | ICD-10-CM

## 2022-11-25 DIAGNOSIS — F411 Generalized anxiety disorder: Secondary | ICD-10-CM

## 2022-11-25 DIAGNOSIS — F32A Depression, unspecified: Secondary | ICD-10-CM

## 2022-11-25 DIAGNOSIS — I10 Essential (primary) hypertension: Secondary | ICD-10-CM

## 2022-12-03 ENCOUNTER — Other Ambulatory Visit: Payer: Self-pay | Admitting: Internal Medicine

## 2022-12-03 NOTE — Telephone Encounter (Signed)
Not a pt at this practice.  

## 2022-12-09 DIAGNOSIS — I10 Essential (primary) hypertension: Secondary | ICD-10-CM | POA: Diagnosis not present

## 2022-12-09 DIAGNOSIS — F411 Generalized anxiety disorder: Secondary | ICD-10-CM | POA: Diagnosis not present

## 2022-12-09 DIAGNOSIS — J9621 Acute and chronic respiratory failure with hypoxia: Secondary | ICD-10-CM

## 2022-12-09 DIAGNOSIS — J449 Chronic obstructive pulmonary disease, unspecified: Secondary | ICD-10-CM

## 2022-12-09 DIAGNOSIS — F32A Depression, unspecified: Secondary | ICD-10-CM | POA: Diagnosis not present

## 2022-12-09 DIAGNOSIS — D649 Anemia, unspecified: Secondary | ICD-10-CM | POA: Diagnosis not present

## 2022-12-09 DIAGNOSIS — R5381 Other malaise: Secondary | ICD-10-CM

## 2022-12-23 ENCOUNTER — Telehealth: Payer: Self-pay | Admitting: Emergency Medicine

## 2022-12-23 ENCOUNTER — Telehealth: Payer: Self-pay | Admitting: Internal Medicine

## 2022-12-23 NOTE — Telephone Encounter (Signed)
Status updated

## 2022-12-23 NOTE — Telephone Encounter (Signed)
Pt passed away on 12/15/2022. NFN

## 2022-12-23 NOTE — Telephone Encounter (Signed)
Pt's daughter called to inform us that her mother passed away on 29-Dec-2022. It shows pt is not currently with our office but  last saw [facility] 06.05.23.

## 2022-12-23 NOTE — Telephone Encounter (Signed)
Can you please change her status to deceased in EPIC?

## 2022-12-23 NOTE — Telephone Encounter (Signed)
Patient called to inform the doctor that patient passed on 12-22-2022.

## 2022-12-23 NOTE — Telephone Encounter (Signed)
Can you please change pt's status to deceased in epic?

## 2022-12-23 NOTE — Telephone Encounter (Signed)
  Patient passed. 

## 2022-12-25 DEATH — deceased
